# Patient Record
Sex: Female | Born: 1950 | Race: White | Hispanic: No | Marital: Married | Smoking: Former smoker
Health system: Southern US, Community
[De-identification: ages and names within clinical notes are randomized; demographics above are authoritative.]

## PROBLEM LIST (undated history)

## (undated) DIAGNOSIS — K2970 Gastritis, unspecified, without bleeding: Secondary | ICD-10-CM

## (undated) DIAGNOSIS — E320 Persistent hyperplasia of thymus: Secondary | ICD-10-CM

## (undated) DIAGNOSIS — D509 Iron deficiency anemia, unspecified: Secondary | ICD-10-CM

## (undated) DIAGNOSIS — Q433 Congenital malformations of intestinal fixation: Secondary | ICD-10-CM

## (undated) DIAGNOSIS — D75839 Thrombocytosis, unspecified: Secondary | ICD-10-CM

## (undated) DIAGNOSIS — I341 Nonrheumatic mitral (valve) prolapse: Secondary | ICD-10-CM

## (undated) HISTORY — PX: THYMECTOMY: SHX1063

## (undated) HISTORY — DX: Iron deficiency anemia, unspecified: D50.9

## (undated) HISTORY — DX: Persistent hyperplasia of thymus: E32.0

## (undated) SURGERY — IMAGING PROCEDURE, GI TRACT, INTRALUMINAL, VIA CAPSULE

---

## 1992-08-28 HISTORY — PX: SMALL INTESTINE SURGERY: SHX150

## 2019-11-12 DIAGNOSIS — D75839 Thrombocytosis, unspecified: Secondary | ICD-10-CM | POA: Insufficient documentation

## 2021-03-16 LAB — HM COLONOSCOPY

## 2021-03-30 ENCOUNTER — Telehealth: Payer: Self-pay | Admitting: Hematology and Oncology

## 2021-03-30 NOTE — Telephone Encounter (Signed)
Kristina Osborne is a new pt who has moved from [place] needing to establish care for thrombocythemia and anemia. She has been scheduled to see Dr. Lorenso Courier on 7/12 at 1pm. Pt aware to arrive 20 minutes early.

## 2021-04-08 ENCOUNTER — Inpatient Hospital Stay
Payer: No Typology Code available for payment source | Attending: Hematology and Oncology | Admitting: Hematology and Oncology

## 2021-04-08 ENCOUNTER — Inpatient Hospital Stay: Payer: No Typology Code available for payment source

## 2021-04-08 ENCOUNTER — Other Ambulatory Visit: Payer: Self-pay

## 2021-04-08 VITALS — BP 106/68 | HR 57 | Temp 97.9°F | Resp 17 | Ht 66.0 in

## 2021-04-08 DIAGNOSIS — D473 Essential (hemorrhagic) thrombocythemia: Secondary | ICD-10-CM | POA: Diagnosis not present

## 2021-04-08 DIAGNOSIS — D509 Iron deficiency anemia, unspecified: Secondary | ICD-10-CM | POA: Diagnosis present

## 2021-04-08 DIAGNOSIS — Z79899 Other long term (current) drug therapy: Secondary | ICD-10-CM | POA: Insufficient documentation

## 2021-04-08 DIAGNOSIS — Z87891 Personal history of nicotine dependence: Secondary | ICD-10-CM | POA: Diagnosis not present

## 2021-04-08 LAB — CMP (CANCER CENTER ONLY)
ALT: 20 U/L (ref 0–44)
AST: 27 U/L (ref 15–41)
Albumin: 4.6 g/dL (ref 3.5–5.0)
Alkaline Phosphatase: 67 U/L (ref 38–126)
Anion gap: 10 (ref 5–15)
BUN: 12 mg/dL (ref 8–23)
CO2: 26 mmol/L (ref 22–32)
Calcium: 10 mg/dL (ref 8.9–10.3)
Chloride: 104 mmol/L (ref 98–111)
Creatinine: 0.81 mg/dL (ref 0.44–1.00)
GFR, Estimated: >60 mL/min (ref >60)
Glucose, Bld: 92 mg/dL (ref 70–99)
Potassium: 4.5 mmol/L (ref 3.5–5.1)
Sodium: 140 mmol/L (ref 135–145)
Total Bilirubin: 0.8 mg/dL (ref 0.3–1.2)
Total Protein: 7.2 g/dL (ref 6.5–8.1)

## 2021-04-08 LAB — CBC WITH DIFFERENTIAL (CANCER CENTER ONLY)
Abs Immature Granulocytes: 0.02 10*3/uL (ref 0.00–0.07)
Basophils Absolute: 0 10*3/uL (ref 0.0–0.1)
Basophils Relative: 0 %
Eosinophils Absolute: 0 10*3/uL (ref 0.0–0.5)
Eosinophils Relative: 0 %
HCT: 37.9 % (ref 36.0–46.0)
Hemoglobin: 13.4 g/dL (ref 12.0–15.0)
Immature Granulocytes: 0 %
Lymphocytes Relative: 10 %
Lymphs Abs: 0.6 10*3/uL — ABNORMAL LOW (ref 0.7–4.0)
MCH: 43.8 pg — ABNORMAL HIGH (ref 26.0–34.0)
MCHC: 35.4 g/dL (ref 30.0–36.0)
MCV: 123.9 fL — ABNORMAL HIGH (ref 80.0–100.0)
Monocytes Absolute: 0.5 10*3/uL (ref 0.1–1.0)
Monocytes Relative: 7 %
Neutro Abs: 5.6 10*3/uL (ref 1.7–7.7)
Neutrophils Relative %: 83 %
Platelet Count: 182 10*3/uL (ref 150–400)
RBC: 3.06 MIL/uL — ABNORMAL LOW (ref 3.87–5.11)
RDW: 12.6 % (ref 11.5–15.5)
WBC Count: 6.7 10*3/uL (ref 4.0–10.5)
nRBC: 0 % (ref 0.0–0.2)

## 2021-04-08 LAB — IRON AND TIBC
Iron: 74 ug/dL (ref 41–142)
Saturation Ratios: 27 % (ref 21–57)
TIBC: 274 ug/dL (ref 236–444)
UIBC: 200 ug/dL (ref 120–384)

## 2021-04-08 LAB — FERRITIN: Ferritin: 350 ng/mL — ABNORMAL HIGH (ref 11–307)

## 2021-04-08 NOTE — Progress Notes (Signed)
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 Lorenso Courier   CHIEF COMPLAINTS/PURPOSE OF CONSULTATION:  "Essential thrombocythemia"  HISTORY OF PRESENTING ILLNESS:  Kristina Osborne 70 y.o. female with medical history significant for iron deficiency anemia and reported essential thrombocythemia who presents to establish care.   On review of the previous records Kristina Osborne was previously cared for at the [facility] in [place], [place]. She has been on hydroxyurea 1500 mg alternating with 1000 mg every other day.  Has been receiving weekly labs up there but recently moved down to [place] and presents to establish care with a new hematological provider.  On exam today Kristina Osborne reports that she was initially diagnosed with essential thrombocythemia when her platelets reached 1.2 million.  She notes that she was put on aspirin at the time and had worsening bleeding of her AVMs.  She was reportedly JAK2 positive but has not undergone a bone marrow biopsy per her report.  She notes that she has been taking the hydroxyurea 1500 mg alternating with 1000 mg every other day.  She has that she is tolerating it well without any mouth sores, stomach upset, or other symptoms.  On further discussion she notes that her family history is remarkable for heart disease in her mother.  She notes that her father is deceased but is unsure how he passed away.  She also does not currently have any children.  She has a sister who is healthy.  She notes that she was a smoker but quit smoking about 10 to 15 years ago.  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  She does not currently drink any alcohol.  At this time she denies any fevers, chills, sweats, nausea, vomiting or diarrhea.  A full 10 point ROS is listed below.  MEDICAL HISTORY:  History reviewed. No pertinent past medical history.  SURGICAL HISTORY: History reviewed. No pertinent surgical history.  SOCIAL HISTORY: Social History   Socioeconomic History   Marital status: Married    Spouse name: Not on file   Number of children: 0   Years of education: Not on file   Highest education level: Not on file  Occupational History   Not on file  Tobacco Use   Smoking status: Former    Years: 15.00    Types: Cigarettes   Smokeless tobacco: Never  Vaping Use   Vaping Use: Never used  Substance and Sexual Activity   Alcohol use: Not Currently   Drug use: Not on file   Sexual activity: Not on file  Other Topics Concern   Not on file  Social History Narrative   Not on file   Social Determinants of Health   Financial Resource Strain: Not on file  Food Insecurity: Not on file  Transportation Needs: Not on file  Physical Activity: Not on file  Stress: Not on file  Social Connections: Not on file  Intimate Partner Violence: Not on file    FAMILY HISTORY: Family History  Problem Relation Age of Onset   Heart disease Mother    Valvular heart disease Mother    Healthy Sister  ALLERGIES:  is allergic to sulfa antibiotics.  MEDICATIONS:  Current Outpatient Medications  Medication Sig Dispense Refill   Hydroxyurea 100 MG TABS 100 mg. 200 mg alternates with 300 mg     famotidine (PEPCID) 40 MG tablet      folic acid (FOLVITE) 1 MG tablet folic acid 1 mg tablet     propranolol (INDERAL) 10 MG tablet propranolol 10 mg tablet     sucralfate (CARAFATE) 1 g tablet sucralfate 1 gram tablet     No current facility-administered medications for this visit.    REVIEW OF SYSTEMS:   Constitutional: ( - ) fevers, ( - )  chills , ( - ) night  sweats Eyes: ( - ) blurriness of vision, ( - ) double vision, ( - ) watery eyes Ears, nose, mouth, throat, and face: ( - ) mucositis, ( - ) sore throat Respiratory: ( - ) cough, ( - ) dyspnea, ( - ) wheezes Cardiovascular: ( - ) palpitation, ( - ) chest discomfort, ( - ) lower extremity swelling Gastrointestinal:  ( - ) nausea, ( - ) heartburn, ( - ) change in bowel habits Skin: ( - ) abnormal skin rashes Lymphatics: ( - ) new lymphadenopathy, ( - ) easy bruising Neurological: ( - ) numbness, ( - ) tingling, ( - ) new weaknesses Behavioral/Psych: ( - ) mood change, ( - ) new changes  All other systems were reviewed with the patient and are negative.  PHYSICAL EXAMINATION: ECOG PERFORMANCE STATUS: 0 - Asymptomatic  Vitals:   04/08/21 1308  BP: 106/68  Pulse: (!) 57  Resp: 17  Temp: 97.9 F (36.6 C)  SpO2: 100%   Filed Weights    GENERAL: well appearing elderly Caucasian female in NAD  SKIN: skin color, texture, turgor are normal, no rashes or significant lesions EYES: conjunctiva are pink and non-injected, sclera clear  LUNGS: clear to auscultation and percussion with normal breathing effort HEART: regular rate & rhythm and no murmurs and no lower extremity edema PSYCH: alert & oriented x 3, fluent speech NEURO: no focal motor/sensory deficits  LABORATORY DATA:  I have reviewed the data as listed CBC Latest Ref Rng & Units 04/08/2021  WBC 4.0 - 10.5 K/uL 6.7  Hemoglobin 12.0 - 15.0 g/dL 13.4  Hematocrit 36.0 - 46.0 % 37.9  Platelets 150 - 400 K/uL 182    CMP Latest Ref Rng & Units 04/08/2021  Glucose 70 - 99 mg/dL 92  BUN 8 - 23 mg/dL 12  Creatinine 0.44 - 1.00 mg/dL 0.81  Sodium 135 - 145 mmol/L 140  Potassium 3.5 - 5.1 mmol/L 4.5  Chloride 98 - 111 mmol/L 104  CO2 22 - 32 mmol/L 26  Calcium 8.9 - 10.3 mg/dL 10.0  Total Protein 6.5 - 8.1 g/dL 7.2  Total Bilirubin 0.3 - 1.2 mg/dL 0.8  Alkaline Phos 38 - 126 U/L 67  AST 15 - 41 U/L 27  ALT 0 - 44 U/L 20      RADIOGRAPHIC STUDIES: No results found.  ASSESSMENT & PLAN Kristina Osborne 71 y.o. female with medical history significant for iron deficiency anemia and reported essential thrombocythemia who presents to establish care.   After review of the labs, review of the records, and discussion with the patient the patients findings are most consistent with thrombocytosis, reported due to essential thrombocythemia.  In order to confirm the diagnosis we will need records showing that she has a JAK2 mutation.  Additionally she will require a bone marrow  biopsy in order to assure that essential thrombocythemia is the diagnosis.  In the interim we will have the patient continue her hydroxyurea as previously prescribed by her provider in [place].  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  She will require a bone marrow biopsy to confirm this diagnosis --Patient requested that we do not order JAK2 mutational panel today and she will obtain the original results from her previous oncologist --We will schedule the patient for a bone marrow biopsy. --Continue with currently scheduled 1500 mg hydroxyurea alternating with 1000 mg every other day --Return to clinic in 3 months time in order to continue monitoring her CBC.  She has been markedly stable and therefore a 34-monthfollow-up would be appropriate at this time.  Orders Placed This Encounter  Procedures   CBC with Differential ([facility])    Standing Status:   Future    Number of Occurrences:   1    Standing Expiration Date:   04/08/2022   CMP [place])    Standing Status:   Future    Number of Occurrences:   1    Standing Expiration Date:   04/08/2022   Ferritin    Standing Status:   Future    Number of Occurrences:   1    Standing Expiration Date:   04/08/2022   Iron and TIBC     Standing Status:   Future    Number of Occurrences:   1    Standing Expiration Date:   04/08/2022    All questions were answered. The patient knows to call the clinic with any problems, questions or concerns.  A total of more than 60 minutes were spent on this encounter with face-to-face time and non-face-to-face time, including preparing to see the patient, ordering tests and/or medications, counseling the patient and coordination of care as outlined above.   JLedell Peoples MD Department of Hematology/Oncology [facility] HospitalPhone: [phone]: [phone]: [email or web address]  04/12/2021 10:00 AM

## 2021-04-12 ENCOUNTER — Encounter: Payer: Self-pay | Admitting: Hematology and Oncology

## 2021-04-21 ENCOUNTER — Telehealth: Payer: Self-pay | Admitting: *Deleted

## 2021-04-21 ENCOUNTER — Other Ambulatory Visit: Payer: Self-pay | Admitting: Hematology and Oncology

## 2021-04-21 ENCOUNTER — Other Ambulatory Visit: Payer: Self-pay | Admitting: *Deleted

## 2021-04-21 DIAGNOSIS — Z0000 Encounter for general adult medical examination without abnormal findings: Secondary | ICD-10-CM

## 2021-04-21 DIAGNOSIS — D473 Essential (hemorrhagic) thrombocythemia: Secondary | ICD-10-CM

## 2021-04-21 MED ORDER — FOLIC ACID 1 MG PO TABS: 1.0000 mg | ORAL_TABLET | Freq: Every day | ORAL | 1 refills | Status: DC

## 2021-04-21 MED ORDER — FAMOTIDINE 40 MG PO TABS: 40.0000 mg | ORAL_TABLET | Freq: Every day | ORAL | 1 refills | Status: DC

## 2021-04-21 MED ORDER — SUCRALFATE 1 G PO TABS: 1.0000 g | ORAL_TABLET | Freq: Three times a day (TID) | ORAL | 1 refills | Status: DC

## 2021-04-21 MED ORDER — HYDROXYUREA 500 MG PO CAPS: 500.0000 mg | ORAL_CAPSULE | Freq: Every day | ORAL | 1 refills | Status: DC

## 2021-04-21 MED ORDER — PROPRANOLOL HCL 10 MG PO TABS: ORAL_TABLET | ORAL | 1 refills | Status: DC

## 2021-04-21 NOTE — Telephone Encounter (Signed)
-----  Message from Orson Slick, MD sent at 04/19/2021  8:30 AM EDT ----- Please let Kristina Osborne know that her labs look excellent. Plt are 181. Please remind her to have a copy of her JAK2 testing faxed to our office for review. A request has been placed for a bone marrow biopsy.   ----- Message ----- From: Buel Ream, [facility] [place] Sent: 04/08/2021   2:27 PM EDT To: Orson Slick, MD

## 2021-04-21 NOTE — Telephone Encounter (Signed)
TCT patient regarding her recent lab reports. Spoke with her and advised that her platelets are excellent @ 181. She is to continue her current Hydroxyurea regime  of 1500 mg alternating with 1000mg . She is aware of her bone marrow biopsy appt. Reminded her to have her JAK 2 report from her previous hematologist fax'd to us as soon as possible. Pt states she will do that. She also states that she needs refills of her medications as she has not found a PCP yet. Dr. Dorsey is agreeable to this for next 2 months only (except for Hydroxyurea) She uses [facility] on [address]. 

## 2021-05-11 ENCOUNTER — Encounter (HOSPITAL_COMMUNITY): Payer: Self-pay | Admitting: Emergency Medicine

## 2021-05-11 ENCOUNTER — Emergency Department (HOSPITAL_COMMUNITY)
Admission: EM | Admit: 2021-05-11 | Discharge: 2021-05-12 | Disposition: A | Discharge status: Home / Self Care | Payer: No Typology Code available for payment source | Attending: Emergency Medicine | Admitting: Emergency Medicine

## 2021-05-11 ENCOUNTER — Other Ambulatory Visit: Payer: Self-pay

## 2021-05-11 ENCOUNTER — Emergency Department (HOSPITAL_COMMUNITY): Payer: No Typology Code available for payment source

## 2021-05-11 DIAGNOSIS — W108XXA Fall (on) (from) other stairs and steps, initial encounter: Secondary | ICD-10-CM | POA: Insufficient documentation

## 2021-05-11 DIAGNOSIS — Z87891 Personal history of nicotine dependence: Secondary | ICD-10-CM | POA: Insufficient documentation

## 2021-05-11 DIAGNOSIS — S66911A Strain of unspecified muscle, fascia and tendon at wrist and hand level, right hand, initial encounter: Secondary | ICD-10-CM

## 2021-05-11 DIAGNOSIS — S6991XA Unspecified injury of right wrist, hand and finger(s), initial encounter: Secondary | ICD-10-CM | POA: Diagnosis present

## 2021-05-11 DIAGNOSIS — S0990XA Unspecified injury of head, initial encounter: Secondary | ICD-10-CM | POA: Insufficient documentation

## 2021-05-11 DIAGNOSIS — W19XXXA Unspecified fall, initial encounter: Secondary | ICD-10-CM

## 2021-05-11 DIAGNOSIS — Y9301 Activity, walking, marching and hiking: Secondary | ICD-10-CM | POA: Insufficient documentation

## 2021-05-11 DIAGNOSIS — S66912A Strain of unspecified muscle, fascia and tendon at wrist and hand level, left hand, initial encounter: Secondary | ICD-10-CM | POA: Diagnosis not present

## 2021-05-11 NOTE — ED Triage Notes (Signed)
Patient here for evaluation after tripping while walking down stairs by [employer] in the hospital and falling to the floor and landing on right side. Denies loss of consciousness. Complains of right elbow pain. Patient alert, oriented, and in no apparent distress at this time.

## 2021-05-11 NOTE — ED Provider Notes (Signed)
Emergency Medicine Provider Triage Evaluation Note  Kristina Osborne , a 70 y.o. female  was evaluated in triage.  Pt complains of fall.  Patient reports that she suffered a fall just prior to coming to the emergency department.  Patient reports that she missed a step and fell and a few stairs.  Patient endorses falling to the floor and hitting her head.  Patient denies any loss of consciousness.  Patient endorses pain to right elbow.  Rates pain 1/10 on pain scale.  Patient denies being on any blood thinners.  Patient denies any numbness, weakness, visual disturbance, saddle anesthesia.    Review of Systems  Positive: Fall, head injury, right elbow pain Negative: numbness, weakness, visual disturbance, saddle anesthesia  Physical Exam  BP 137/67 (BP Location: Left Arm)   Pulse 93   Temp 98.7 F (37.1 C)   Resp 20   SpO2 100%  Gen:   Awake, no distress   Resp:  Normal effort  MSK:   Moves extremities without difficulty, patient has full range of motion to right elbow.  No swelling or deformity noted to right forearm, right elbow, or right upper arm.  Other:  No midline tenderness or deformity to cervical, thoracic, or lumbar spine.  No vital sign.  Medical Decision Making  Medically screening exam initiated at 3:13 PM.  Appropriate orders placed.  Derrick Eppers was informed that the remainder of the evaluation will be completed by another provider, this initial triage assessment does not replace that evaluation, and the importance of remaining in the ED until their evaluation is complete.  The patient appears stable so that the remainder of the work up may be completed by another provider.      Loni Beckwith, PA-C 05/11/21 1513    Elnora Morrison, MD 05/12/21 [phone]

## 2021-05-12 ENCOUNTER — Emergency Department (HOSPITAL_COMMUNITY): Payer: No Typology Code available for payment source

## 2021-05-12 ENCOUNTER — Telehealth: Payer: Self-pay | Admitting: *Deleted

## 2021-05-12 MED ORDER — HYDROCODONE-ACETAMINOPHEN 5-325 MG PO TABS
1.0000 | ORAL_TABLET | Freq: Once | ORAL | Status: AC
  Administered 2021-05-12: 1 via ORAL
  Filled 2021-05-12: qty 1

## 2021-05-12 MED ORDER — HYDROCODONE-ACETAMINOPHEN 5-325 MG PO TABS: 0.5000 | ORAL_TABLET | ORAL | 0 refills | Status: DC | PRN

## 2021-05-12 NOTE — Discharge Instructions (Signed)
Take Norco - 1/2 to 1 tablet every 4-6 hours for pain. Cold compresses to the wrist to reduce swelling.   Return to the ED with any new or concerning symptoms.

## 2021-05-12 NOTE — ED Notes (Signed)
Patient verbalizes understanding of discharge instructions. Prescriptions reviewed. Pt educated on how to apply and remove wrist brace. Opportunity for questioning and answers were provided. Armband removed by staff, pt discharged from ED ambulatory.

## 2021-05-12 NOTE — ED Provider Notes (Signed)
[facility] EMERGENCY DEPARTMENT Provider Note   CSN: [id number] Arrival date & time: 05/11/21  1452     History Chief Complaint  Patient presents with   Kristina Osborne    Kristina Osborne is a 70 y.o. female.  Patient to ED for evaluation of injuries sustained after mechanical fall while walking down steps. She feels she missed the last step or two, causing her to fall to the side, hitting her head against a wall before falling to the ground. She did not lose consciousness. No subsequent nausea or vomiting. She reports right wrist pain but is unsure how it was injured. No other injury. No chest/abdominal/neck pain. She has been ambulatory since the accident. Not anticoagulated.   The history is provided by the patient. No language interpreter was used.  Fall Associated symptoms include headaches. Pertinent negatives include no chest pain, no abdominal pain and no shortness of breath.      History reviewed. No pertinent past medical history.  There are no problems to display for this patient.   No past surgical history on file.   OB History   No obstetric history on file.     Family History  Problem Relation Age of Onset   Heart disease Mother    Valvular heart disease Mother    Healthy Sister     Social History   Tobacco Use   Smoking status: Former    Years: 15.00    Types: Cigarettes   Smokeless tobacco: Never  Vaping Use   Vaping Use: Never used  Substance Use Topics   Alcohol use: Not Currently    Home Medications Prior to Admission medications   Medication Sig Start Date End Date Taking? Authorizing Provider  famotidine (PEPCID) 40 MG tablet Take 1 tablet (40 mg total) by mouth daily. 04/21/21   Orson Slick, MD  folic acid (FOLVITE) 1 MG tablet Take 1 tablet (1 mg total) by mouth daily. 04/21/21   Orson Slick, MD  hydroxyurea (HYDREA) 500 MG capsule Take 1 capsule (500 mg total) by mouth daily. Take 2 tablets alternating with  3  tablets every other day. May take with food to minimize GI side effects. 04/21/21   Orson Slick, MD  propranolol (INDERAL) 10 MG tablet propranolol 10 mg tablet 04/21/21   Orson Slick, MD  sucralfate (CARAFATE) 1 g tablet Take 1 tablet (1 g total) by mouth 4 (four) times daily -  with meals and at bedtime. 04/21/21   Orson Slick, MD    Allergies    Sulfa antibiotics  Review of Systems   Review of Systems  Constitutional:  Negative for diaphoresis.  HENT: Negative.  Negative for facial swelling and nosebleeds.   Eyes:  Negative for visual disturbance.  Respiratory: Negative.  Negative for shortness of breath.   Cardiovascular: Negative.  Negative for chest pain.  Gastrointestinal: Negative.  Negative for abdominal pain, nausea and vomiting.  Musculoskeletal:  Negative for back pain and neck pain.       See HPI.  Skin: Negative.  Negative for color change and wound.  Neurological:  Positive for headaches. Negative for syncope and weakness.   Physical Exam Updated Vital Signs BP 122/64 (BP Location: Right Arm)   Pulse 68   Temp 98.7 F (37.1 C)   Resp 16   SpO2 100%   Physical Exam Vitals and nursing note reviewed.  Constitutional:      General: She is not  in acute distress.    Appearance: She is well-developed. She is not ill-appearing.  HENT:     Head: Normocephalic and atraumatic.     Comments: No wounds, redness or hematoma    Nose: Nose normal.  Eyes:     Pupils: Pupils are equal, round, and reactive to light.  Cardiovascular:     Rate and Rhythm: Normal rate.  Pulmonary:     Effort: Pulmonary effort is normal.  Chest:     Chest wall: No tenderness.  Abdominal:     Tenderness: There is no abdominal tenderness.  Musculoskeletal:        General: Normal range of motion.     Cervical back: Normal range of motion.     Comments: No midline cervical tenderness. Right wrist mildly swollen without deformity or discoloration. There is pain with movement.  Has full range of all digits of right hand, and full range of elbow. Cap RF >2s.  Skin:    General: Skin is warm and dry.     Findings: No bruising or erythema.  Neurological:     Mental Status: She is alert and oriented to person, place, and time.     Sensory: No sensory deficit.     Coordination: Coordination normal.     Gait: Gait normal.    ED Results / Procedures / Treatments   Labs (all labs ordered are listed, but only abnormal results are displayed) Labs Reviewed - No data to display  EKG None  Radiology DG Wrist Complete Right  Result Date: 05/12/2021 CLINICAL DATA:  Fall, wrist pain EXAM: RIGHT WRIST - COMPLETE 3+ VIEW COMPARISON:  None. FINDINGS: No fracture or dislocation is seen. The joint spaces are preserved. The visualized soft tissues are unremarkable. IMPRESSION: Negative. Electronically Signed   By: Julian Hy M.D.   On: 05/12/2021 00:15   CT HEAD WO CONTRAST (5MM)  Result Date: 05/11/2021 CLINICAL DATA:  Fall. EXAM: CT HEAD WITHOUT CONTRAST TECHNIQUE: Contiguous axial images were obtained from the base of the skull through the vertex without intravenous contrast. COMPARISON:  None. FINDINGS: Brain: No evidence of acute infarction, hemorrhage, hydrocephalus, extra-axial collection or mass lesion/mass effect. Cerebral volume within normal limits for age. Vascular: No hyperdense vessel or unexpected calcification. Skull: Normal. Negative for fracture or focal lesion. Sinuses/Orbits: No acute finding. Other: None. IMPRESSION: 1. Normal for age noncontrast head CT. Electronically Signed   By: Titus Dubin M.D.   On: 05/11/2021 16:04    Procedures Procedures   Medications Ordered in ED Medications  HYDROcodone-acetaminophen (NORCO/VICODIN) 5-325 MG per tablet 1 tablet (has no administration in time range)    ED Course  I have reviewed the triage vital signs and the nursing notes.  Pertinent labs & imaging results that were available during my care of the  patient were reviewed by me and considered in my medical decision making (see chart for details).    MDM Rules/Calculators/A&P                           Patient to ED with injuries after fall. She reports headache pain after hitting her head (no LOC) and right wrist pain.   She is very well appearing, comfortable. In NAD. VSS. Head CT and right wrist imaging negative. Her neck is becoming sore over time, c/w musculoskeletal pain. No midline tenderness to suggest cervical injury.   She can be discharged home. Wrist splint applied. Will refer to hand ortho for  recheck if pain persists.   Final Clinical Impression(s) / ED Diagnoses Final diagnoses:  None   Fall Right wrist strain Minor head injury  Rx / DC Orders ED Discharge Orders     None        Dennie Bible 05/12/21 0442    Arnaldo Natal, MD 05/12/21 [phone]

## 2021-05-12 NOTE — Telephone Encounter (Signed)
Received call from pt. She states she sustained a fall @ [facility] while visiting her husband. She was seen in the ED and released to go home.  She states her work suggested she apply for short term disability for a week. She is asking if Dr. Lorenso Courier would help with this. Advised that it takes more than a week to go through the process of short term disability. Suggested she get a note from the ED for a work excuse rather than go through the hoops of STD for only a week. Also advised that since Dr. Lorenso Courier is not the treating physician for her fall, he could not be the MD to sign off on this. Whole speaking with her,  asked her if she spoke to her previous doctor has fax'd the [facility] report as we have not received that report yet. She states she will contact her previous MD for that again.

## 2021-05-15 ENCOUNTER — Other Ambulatory Visit: Payer: Self-pay | Admitting: Hematology and Oncology

## 2021-05-17 ENCOUNTER — Other Ambulatory Visit (HOSPITAL_COMMUNITY): Payer: Self-pay | Admitting: Physician Assistant

## 2021-05-18 ENCOUNTER — Other Ambulatory Visit: Payer: Self-pay | Admitting: Radiology

## 2021-05-19 ENCOUNTER — Ambulatory Visit (HOSPITAL_COMMUNITY): Payer: No Typology Code available for payment source

## 2021-05-19 ENCOUNTER — Ambulatory Visit (HOSPITAL_COMMUNITY): Admission: RE | Admit: 2021-05-19 | Payer: No Typology Code available for payment source | Source: Ambulatory Visit

## 2021-06-08 ENCOUNTER — Other Ambulatory Visit: Payer: Self-pay | Admitting: Hematology and Oncology

## 2021-06-14 ENCOUNTER — Other Ambulatory Visit: Payer: Self-pay | Admitting: *Deleted

## 2021-06-14 MED ORDER — SUCRALFATE 1 G PO TABS: 1.0000 g | ORAL_TABLET | Freq: Three times a day (TID) | ORAL | 0 refills | Status: DC

## 2021-06-14 MED ORDER — FAMOTIDINE 40 MG PO TABS: 40.0000 mg | ORAL_TABLET | Freq: Every day | ORAL | 0 refills | Status: DC

## 2021-06-14 NOTE — Telephone Encounter (Signed)
Received faxed refill request for 90 day supply of Carafate and Pepcid - 90 day supply due to insurance requirements. Per Dr. Lorenso Courier, will refill this one time only.  Further refills to be filled by patient's PCP - has appt with Dr.Jones on 07/08/21 to establish care.

## 2021-06-27 ENCOUNTER — Other Ambulatory Visit: Payer: Self-pay | Admitting: Hematology and Oncology

## 2021-07-03 ENCOUNTER — Other Ambulatory Visit: Payer: Self-pay | Admitting: Hematology and Oncology

## 2021-07-11 ENCOUNTER — Inpatient Hospital Stay: Payer: No Typology Code available for payment source | Admitting: Hematology and Oncology

## 2021-07-11 ENCOUNTER — Inpatient Hospital Stay: Payer: No Typology Code available for payment source

## 2021-07-28 ENCOUNTER — Inpatient Hospital Stay (HOSPITAL_BASED_OUTPATIENT_CLINIC_OR_DEPARTMENT_OTHER): Payer: No Typology Code available for payment source | Admitting: Hematology and Oncology

## 2021-07-28 ENCOUNTER — Encounter: Payer: Self-pay | Admitting: Hematology and Oncology

## 2021-07-28 ENCOUNTER — Other Ambulatory Visit: Payer: Self-pay

## 2021-07-28 ENCOUNTER — Other Ambulatory Visit: Payer: Self-pay | Admitting: Hematology and Oncology

## 2021-07-28 ENCOUNTER — Inpatient Hospital Stay: Payer: No Typology Code available for payment source | Attending: Hematology and Oncology

## 2021-07-28 VITALS — BP 106/61 | HR 78 | Temp 97.7°F | Resp 16 | Ht 66.0 in

## 2021-07-28 DIAGNOSIS — D509 Iron deficiency anemia, unspecified: Secondary | ICD-10-CM | POA: Diagnosis present

## 2021-07-28 DIAGNOSIS — D473 Essential (hemorrhagic) thrombocythemia: Secondary | ICD-10-CM

## 2021-07-28 DIAGNOSIS — D75839 Thrombocytosis, unspecified: Secondary | ICD-10-CM | POA: Diagnosis not present

## 2021-07-28 LAB — CBC WITH DIFFERENTIAL (CANCER CENTER ONLY)
Abs Immature Granulocytes: 0.01 10*3/uL (ref 0.00–0.07)
Basophils Absolute: 0 10*3/uL (ref 0.0–0.1)
Basophils Relative: 1 %
Eosinophils Absolute: 0 10*3/uL (ref 0.0–0.5)
Eosinophils Relative: 0 %
HCT: 39.1 % (ref 36.0–46.0)
Hemoglobin: 13.3 g/dL (ref 12.0–15.0)
Immature Granulocytes: 0 %
Lymphocytes Relative: 13 %
Lymphs Abs: 0.7 10*3/uL (ref 0.7–4.0)
MCH: 42 pg — ABNORMAL HIGH (ref 26.0–34.0)
MCHC: 34 g/dL (ref 30.0–36.0)
MCV: 123.3 fL — ABNORMAL HIGH (ref 80.0–100.0)
Monocytes Absolute: 0.5 10*3/uL (ref 0.1–1.0)
Monocytes Relative: 9 %
Neutro Abs: 4 10*3/uL (ref 1.7–7.7)
Neutrophils Relative %: 77 %
Platelet Count: 214 10*3/uL (ref 150–400)
RBC: 3.17 MIL/uL — ABNORMAL LOW (ref 3.87–5.11)
RDW: 11.3 % — ABNORMAL LOW (ref 11.5–15.5)
WBC Count: 5.2 10*3/uL (ref 4.0–10.5)
nRBC: 0 % (ref 0.0–0.2)

## 2021-07-28 LAB — CMP (CANCER CENTER ONLY)
ALT: 22 U/L (ref 0–44)
AST: 25 U/L (ref 15–41)
Albumin: 4.3 g/dL (ref 3.5–5.0)
Alkaline Phosphatase: 67 U/L (ref 38–126)
Anion gap: 10 (ref 5–15)
BUN: 11 mg/dL (ref 8–23)
CO2: 25 mmol/L (ref 22–32)
Calcium: 9.5 mg/dL (ref 8.9–10.3)
Chloride: 105 mmol/L (ref 98–111)
Creatinine: 0.82 mg/dL (ref 0.44–1.00)
GFR, Estimated: >60 mL/min (ref >60)
Glucose, Bld: 92 mg/dL (ref 70–99)
Potassium: 4.1 mmol/L (ref 3.5–5.1)
Sodium: 140 mmol/L (ref 135–145)
Total Bilirubin: 0.6 mg/dL (ref 0.3–1.2)
Total Protein: 7 g/dL (ref 6.5–8.1)

## 2021-07-28 NOTE — Progress Notes (Signed)
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 Lorenso Courier   Interval History:  Kristina Osborne 70 y.o. female with medical history significant for essential thrombocytosis who presents for a follow up visit. The patient's last visit was on 04/08/2021 at which time she established care. In the interim since the last visit Kristina Osborne has declined to have a bone marrow biopsy performed and has not produced the JAK2 testing results.  On exam today Kristina Osborne is accompanied by her husband.  She reports that she has been well overall in the interim since her last visit.  She denies any nausea, ming, or diarrhea.  She denies any stomach upset.  She is tolerating her hydroxyurea therapy well without any difficulty.  She denies any ulcers on her mouth or on her ankles.  She reports no difficulty with bleeding or bruising.  She also denies any signs or symptoms concerning for VTE including shortness of breath, leg swelling, or chest pain.  A full 10 point ROS is listed below.  The bulk of our discussion focused on the reason for why we would like a bone marrow biopsy.  The patient noted that she would like to hold at this time.  MEDICAL HISTORY:  No past medical history on file.  SURGICAL HISTORY: No past surgical history on file.  SOCIAL HISTORY: Social History   Socioeconomic History   Marital status: Married    Spouse name: Not on file   Number of children: 0   Years of education: Not on file   Highest education level: Not on file  Occupational History   Not on file  Tobacco Use   Smoking status: Former    Years: 15.00    Types: Cigarettes   Smokeless  tobacco: Never  Vaping Use   Vaping Use: Never used  Substance and Sexual Activity   Alcohol use: Not Currently   Drug use: Not on file   Sexual activity: Not on file  Other Topics Concern   Not on file  Social History Narrative   Not on file   Social Determinants of Health   Financial Resource Strain: Not on file  Food Insecurity: Not on file  Transportation Needs: Not on file  Physical Activity: Not on file  Stress: Not on file  Social Connections: Not on file  Intimate Partner Violence: Not on file    FAMILY HISTORY: Family History  Problem Relation Age of Onset   Heart disease Mother    Valvular heart disease Mother    Healthy Sister     ALLERGIES:  is allergic to sulfa antibiotics.  MEDICATIONS:  Current Outpatient Medications  Medication Sig Dispense Refill   famotidine (PEPCID) 40 MG tablet Take 1 tablet (40 mg total) by mouth daily. 90 tablet 0   folic acid (FOLVITE) 1 MG tablet TAKE 1 TABLET BY MOUTH EVERY DAY 30 tablet 1   hydroxyurea (HYDREA) 500 MG capsule TAKE 2 CAPS ALTERNATING WITH 3 CAPS EVERY OTHER DAY W/FOOD TO MINIMIZE GI EFFECTS 60 capsule 1   propranolol (INDERAL) 10 MG tablet propranolol 10 mg tablet 30 tablet 1   sucralfate (CARAFATE) 1 g tablet Take 1 tablet (1 g total) by mouth  4 (four) times daily -  with meals and at bedtime. 360 tablet 0   No current facility-administered medications for this visit.    REVIEW OF SYSTEMS:   Constitutional: ( - ) fevers, ( - )  chills , ( - ) night sweats Eyes: ( - ) blurriness of vision, ( - ) double vision, ( - ) watery eyes Ears, nose, mouth, throat, and face: ( - ) mucositis, ( - ) sore throat Respiratory: ( - ) cough, ( - ) dyspnea, ( - ) wheezes Cardiovascular: ( - ) palpitation, ( - ) chest discomfort, ( - ) lower extremity swelling Gastrointestinal:  ( - ) nausea, ( - ) heartburn, ( - ) change in bowel habits Skin: ( - ) abnormal skin rashes Lymphatics: ( - ) new lymphadenopathy, ( - ) easy  bruising Neurological: ( - ) numbness, ( - ) tingling, ( - ) new weaknesses Behavioral/Psych: ( - ) mood change, ( - ) new changes  All other systems were reviewed with the patient and are negative.  PHYSICAL EXAMINATION: ECOG PERFORMANCE STATUS: 0 - Asymptomatic  Vitals:   07/28/21 1104  BP: 106/61  Pulse: 78  Resp: 16  Temp: 97.7 F (36.5 C)  SpO2: 100%   Filed Weights    GENERAL: Well-appearing elderly Caucasian female, alert, no distress and comfortable SKIN: skin color, texture, turgor are normal, no rashes or significant lesions EYES: conjunctiva are pink and non-injected, sclera clear LUNGS: clear to auscultation and percussion with normal breathing effort HEART: regular rate & rhythm and no murmurs and no lower extremity edema Musculoskeletal: no cyanosis of digits and no clubbing  PSYCH: alert & oriented x 3, fluent speech NEURO: no focal motor/sensory deficits  LABORATORY DATA:  I have reviewed the data as listed CBC Latest Ref Rng & Units 07/28/2021 04/08/2021  WBC 4.0 - 10.5 K/uL 5.2 6.7  Hemoglobin 12.0 - 15.0 g/dL 13.3 13.4  Hematocrit 36.0 - 46.0 % 39.1 37.9  Platelets 150 - 400 K/uL 214 182    CMP Latest Ref Rng & Units 07/28/2021 04/08/2021  Glucose 70 - 99 mg/dL 92 92  BUN 8 - 23 mg/dL 11 12  Creatinine 0.44 - 1.00 mg/dL 0.82 0.81  Sodium 135 - 145 mmol/L 140 140  Potassium 3.5 - 5.1 mmol/L 4.1 4.5  Chloride 98 - 111 mmol/L 105 104  CO2 22 - 32 mmol/L 25 26  Calcium 8.9 - 10.3 mg/dL 9.5 10.0  Total Protein 6.5 - 8.1 g/dL 7.0 7.2  Total Bilirubin 0.3 - 1.2 mg/dL 0.6 0.8  Alkaline Phos 38 - 126 U/L 67 67  AST 15 - 41 U/L 25 27  ALT 0 - 44 U/L 22 20   RADIOGRAPHIC STUDIES: No results found.  ASSESSMENT & PLAN Kristina Osborne 70 y.o. female with medical history significant for essential thrombocytosis who presents for a follow up visit.   After review of the labs, review of the records, and discussion with the patient the patients findings are  most consistent with thrombocytosis, reported due to essential thrombocythemia.  In order to confirm the diagnosis we will need records showing that she has a JAK2 mutation.  Additionally she will require a bone marrow biopsy in order to assure that essential thrombocythemia is the diagnosis.  In the interim we will have the patient continue her hydroxyurea as previously prescribed by her provider in [place].  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  She will require a bone marrow biopsy to confirm this diagnosis --Patient requested that we do not order JAK2 mutational panel today and she will obtain the original results from her previous oncologist. She noted that would be emailed to [place] today.  --We will schedule the patient for a bone marrow biopsy. --Continue with currently scheduled 1500 mg hydroxyurea alternating with 1000 mg every other day --Return to clinic in 3 months time in order to continue monitoring her CBC.  She has been markedly stable and therefore a 53-monthfollow-up would be appropriate at this time.   No orders of the defined types were placed in this encounter.  All questions were answered. The patient knows to call the clinic with any problems, questions or concerns.  A total of more than 30 minutes were spent on this encounter with face-to-face time and non-face-to-face time, including preparing to see the patient, ordering tests and/or medications, counseling the patient and coordination of care as outlined above.   JLedell Peoples MD Department of Hematology/Oncology [place] LLCPhone: [phone]: [phone]: [email or web address]  07/28/2021 12:34 PM

## 2021-08-04 ENCOUNTER — Encounter: Payer: Self-pay | Admitting: Internal Medicine

## 2021-08-26 ENCOUNTER — Other Ambulatory Visit: Payer: Self-pay | Admitting: Hematology and Oncology

## 2021-08-31 ENCOUNTER — Telehealth: Payer: Self-pay | Admitting: *Deleted

## 2021-08-31 NOTE — Telephone Encounter (Signed)
Received call from pt stating that she thinks she has Covid as her husband was admitted to the hospital with covid. She is asking if we can prescribe Paxlovid for her.  Asked pt if she has done a Covid test. She states she has not.  Advised that pt will need to go to an Urgent Care and be tested and then prescribed appropriate medication.  Pt reluctant to go as she does not feel well. Advised that this is what needs to happen. She needs to be seen by her PCP (she states she does not have one) or go to Urgent Care for appropriate treatment.  Pt voiced understanding.

## 2021-08-31 NOTE — Telephone Encounter (Signed)
Received call back from pt. She states she has tried 4 different Urgent Care centers and was not able to be seen. She called asking what she should do. Advised that I would call [facility] on [address] and find out if she can get in. Call made to to [facility]. They said that had no 'regular appts' but that Ms. Dykes could come in as a walk-in. The wait is 1-2 hours. Call made to pt and advised of the above.. Pt appreciative and states she will go there.

## 2021-09-01 ENCOUNTER — Ambulatory Visit (HOSPITAL_COMMUNITY)
Admission: EM | Admit: 2021-09-01 | Discharge: 2021-09-01 | Disposition: A | Discharge status: Home / Self Care | Payer: No Typology Code available for payment source | Attending: Emergency Medicine | Admitting: Emergency Medicine

## 2021-09-01 ENCOUNTER — Ambulatory Visit: Payer: Self-pay

## 2021-09-01 ENCOUNTER — Other Ambulatory Visit: Payer: Self-pay

## 2021-09-01 ENCOUNTER — Encounter (HOSPITAL_COMMUNITY): Payer: Self-pay | Admitting: Emergency Medicine

## 2021-09-01 DIAGNOSIS — J069 Acute upper respiratory infection, unspecified: Secondary | ICD-10-CM | POA: Diagnosis present

## 2021-09-01 DIAGNOSIS — Z20822 Contact with and (suspected) exposure to covid-19: Secondary | ICD-10-CM | POA: Insufficient documentation

## 2021-09-01 HISTORY — DX: Thrombocytosis, unspecified: D75.839

## 2021-09-01 HISTORY — DX: Gastritis, unspecified, without bleeding: K29.70

## 2021-09-01 HISTORY — DX: Nonrheumatic mitral (valve) prolapse: I34.1

## 2021-09-01 HISTORY — DX: Congenital malformations of intestinal fixation: Q43.3

## 2021-09-01 LAB — SARS CORONAVIRUS 2 (TAT 6-24 HRS): SARS Coronavirus 2: POSITIVE — AB

## 2021-09-01 MED ORDER — MOLNUPIRAVIR EUA 200MG CAPSULE: 4.0000 | ORAL_CAPSULE | Freq: Two times a day (BID) | ORAL | 0 refills | Status: AC

## 2021-09-01 MED ORDER — PROMETHAZINE-DM 6.25-15 MG/5ML PO SYRP: 5.0000 mL | ORAL_SOLUTION | Freq: Four times a day (QID) | ORAL | 0 refills | Status: DC | PRN

## 2021-09-01 MED ORDER — FLUTICASONE PROPIONATE 50 MCG/ACT NA SUSP: 2.0000 | Freq: Every day | NASAL | 0 refills | Status: DC

## 2021-09-01 NOTE — ED Triage Notes (Signed)
Patient c/o nonproductive cough and fever x 3 days.   Patient endorses highest temperature of 104 F.   Patient is interested in antiviral treatment if possible.   Patient endorses being exposed to [place] "husband is currently in the hospital for [place]".   Patient has taken Robitussin and Tylenol with no relief of symptoms.

## 2021-09-01 NOTE — ED Provider Notes (Signed)
HPI  SUBJECTIVE:  Kristina Osborne is a 71 y.o. female who presents with fevers 100.4, cough, headache, nasal congestion, rhinorrhea, sore throat starting 3 days ago.  No body aches, loss of sense of smell or taste, postnasal drip, wheezing, shortness of breath, nausea, vomiting, diarrhea, abdominal pain.  She states her husband is currently admitted to the hospital with [place].  She took an antipyretic within 6 hours of evaluation.  She has been taking 1000 mg of Tylenol and Robitussin-DM.  The Tylenol helps.  No aggravating factors.  She got 4 doses of the COVID-vaccine and also this years flu vaccine.  She has a past medical history of mitral valve prolapse, GI AVM, and thrombocytosis.  She has never had COVID.  PMD: She is establishing care with a PCP in mid January.  Past Medical History:  Diagnosis Date   Congenital malformation of intestinal fixation    Gastritis    Mitral valve prolapse    Thrombocytosis     Past Surgical History:  Procedure Laterality Date   SMALL INTESTINE SURGERY  1994    Family History  Problem Relation Age of Onset   Heart disease Mother    Valvular heart disease Mother    Healthy Sister     Social History   Tobacco Use   Smoking status: Former    Years: 15.00    Types: Cigarettes   Smokeless tobacco: Never  Vaping Use   Vaping Use: Never used  Substance Use Topics   Alcohol use: Not Currently    No current facility-administered medications for this encounter.  Current Outpatient Medications:    famotidine (PEPCID) 40 MG tablet, Take 1 tablet (40 mg total) by mouth daily., Disp: 90 tablet, Rfl: 0   fluticasone (FLONASE) 50 MCG/ACT nasal spray, Place 2 sprays into both nostrils daily., Disp: 16 g, Rfl: 0   folic acid (FOLVITE) 1 MG tablet, TAKE 1 TABLET BY MOUTH EVERY DAY, Disp: 30 tablet, Rfl: 1   hydroxyurea (HYDREA) 500 MG capsule, TAKE 2 CAPS ALTERNATING WITH 3 CAPS EVERY OTHER DAY W/FOOD TO MINIMIZE GI EFFECTS, Disp: 60 capsule, Rfl:  1   molnupiravir EUA (LAGEVRIO) 200 mg CAPS capsule, Take 4 capsules (800 mg total) by mouth 2 (two) times daily for 5 days., Disp: 40 capsule, Rfl: 0   promethazine-dextromethorphan (PROMETHAZINE-DM) 6.25-15 MG/5ML syrup, Take 5 mLs by mouth 4 (four) times daily as needed for cough., Disp: 118 mL, Rfl: 0   sucralfate (CARAFATE) 1 g tablet, Take 1 tablet (1 g total) by mouth 4 (four) times daily -  with meals and at bedtime., Disp: 360 tablet, Rfl: 0   propranolol (INDERAL) 10 MG tablet, propranolol 10 mg tablet, Disp: 30 tablet, Rfl: 1  Allergies  Allergen Reactions   Aspirin Other (See Comments)    "Due to intestinal issue"   Sulfa Antibiotics      ROS  As noted in HPI.   Physical Exam  BP (!) 114/59 (BP Location: Left Arm)    Pulse (!) 106    Temp 98.8 F (37.1 C) (Oral)    Resp 18    LMP  (LMP Unknown)    SpO2 96%   Constitutional: Well developed, well nourished, no acute distress Eyes: PERRL, EOMI, conjunctiva normal bilaterally HENT: Normocephalic, atraumatic,mucus membranes moist.  Positive nasal congestion.  No maxillary, frontal sinus tenderness Respiratory: Clear to auscultation bilaterally, no rales, no wheezing, no rhonchi Cardiovascular: Regular tachycardia, no murmurs, no gallops, no rubs GI: Nondistended skin: No rash, skin  intact Musculoskeletal: No deformities Neurologic: Alert & oriented x 3, CN III-XII grossly intact, no motor deficits, sensation grossly intact Psychiatric: Speech and behavior appropriate   ED Course   Medications - No data to display  Orders Placed This Encounter  Procedures   SARS CORONAVIRUS 2 (TAT 6-24 HRS) Nasopharyngeal Nasopharyngeal Swab    Standing Status:   Standing    Number of Occurrences:   1   No results found for this or any previous visit (from the past 24 hour(s)). No results found. Results for orders placed or performed during the hospital encounter of 09/01/21  SARS CORONAVIRUS 2 (TAT 6-24 HRS) Nasopharyngeal  Nasopharyngeal Swab   Specimen: Nasopharyngeal Swab  Result Value Ref Range   SARS Coronavirus 2 POSITIVE (A) NEGATIVE     ED Clinical Impression  1. Upper respiratory tract infection, unspecified type   2. Close exposure to COVID-19 virus      ED Assessment/Plan   COVID sent.  However, given her significant exposure, suspect COVID.  Starting her Molnupiravir today.  She will discontinue it if her COVID is negative.  She has MyChart.  In the meantime, Tylenol, Flonase, saline nasal irrigation, Mucinex D, Promethazine DM.  Will give patient a primary care list for ongoing care.  COVID positive.  Plan as above.  Discussed labs, MDM, treatment plan, and plan for follow-up with patient. patient agrees with plan.   Meds ordered this encounter  Medications   fluticasone (FLONASE) 50 MCG/ACT nasal spray    Sig: Place 2 sprays into both nostrils daily.    Dispense:  16 g    Refill:  0   promethazine-dextromethorphan (PROMETHAZINE-DM) 6.25-15 MG/5ML syrup    Sig: Take 5 mLs by mouth 4 (four) times daily as needed for cough.    Dispense:  118 mL    Refill:  0   molnupiravir EUA (LAGEVRIO) 200 mg CAPS capsule    Sig: Take 4 capsules (800 mg total) by mouth 2 (two) times daily for 5 days.    Dispense:  40 capsule    Refill:  0      *This clinic note was created using [employer]. Therefore, there may be occasional mistakes despite careful proofreading. ?    Melynda Ripple, MD 09/02/21 [phone]

## 2021-09-01 NOTE — Discharge Instructions (Addendum)
Finish the [employer], even if you feel better.  Continue it if your COVID is negative.  In the meantime, 1000 mg of Tylenol 3-4 times a day as needed, Flonase, saline nasal irrigation with a NeilMed sinus rinse and distilled water as often as you want, Mucinex D for the nasal congestion, sinus pain/pressure.  Promethazine DM for the cough.  Below is a list of primary care practices who are taking new patients for you to follow-up with.  [facility] -multiple locations.  See website at [email or web address]  [facility] internal medicine clinic Ground Floor - [facility], [place], [place], [place] [address] [phone]  [facility] at [place] [facility] [address] [place], [place] [address] [phone]  [place] and [place], [place] [address] [phone]  [facility] [phone] [place] [address]  [facility]: [place]  [phone]  [facility] Family [facility]: [address] [place]  [phone]  [place] primary care : [address] [place] [phone]  [facility] Primary Care: [address] [place] [address] [phone]  [facility]: [place] [phone]  Dr. Blanchie Serve [place] [address]  [phone]  Go to www.goodrx.com  or www.costplusdrugs.com to look up your medications. This will give you a list of where you can find your prescriptions at the most affordable prices. Or ask the pharmacist what the cash price is, or if they have any other discount programs available to help make your medication more affordable. This  can be less expensive than what you would pay with insurance.

## 2021-09-02 ENCOUNTER — Ambulatory Visit (HOSPITAL_COMMUNITY): Payer: Self-pay

## 2021-09-03 ENCOUNTER — Other Ambulatory Visit: Payer: Self-pay | Admitting: Hematology and Oncology

## 2021-09-07 ENCOUNTER — Ambulatory Visit: Payer: No Typology Code available for payment source | Admitting: Internal Medicine

## 2021-09-07 ENCOUNTER — Other Ambulatory Visit: Payer: Self-pay

## 2021-09-07 ENCOUNTER — Ambulatory Visit (INDEPENDENT_AMBULATORY_CARE_PROVIDER_SITE_OTHER): Payer: No Typology Code available for payment source | Admitting: Internal Medicine

## 2021-09-07 ENCOUNTER — Encounter: Payer: Self-pay | Admitting: Internal Medicine

## 2021-09-07 VITALS — BP 106/66 | HR 67 | Temp 97.6°F | Ht 66.0 in

## 2021-09-07 DIAGNOSIS — D473 Essential (hemorrhagic) thrombocythemia: Secondary | ICD-10-CM | POA: Diagnosis not present

## 2021-09-07 DIAGNOSIS — Z23 Encounter for immunization: Secondary | ICD-10-CM

## 2021-09-07 DIAGNOSIS — Z1231 Encounter for screening mammogram for malignant neoplasm of breast: Secondary | ICD-10-CM | POA: Insufficient documentation

## 2021-09-07 DIAGNOSIS — U071 COVID-19: Secondary | ICD-10-CM

## 2021-09-07 DIAGNOSIS — K5520 Angiodysplasia of colon without hemorrhage: Secondary | ICD-10-CM | POA: Diagnosis not present

## 2021-09-07 DIAGNOSIS — Z0001 Encounter for general adult medical examination with abnormal findings: Secondary | ICD-10-CM | POA: Diagnosis not present

## 2021-09-07 DIAGNOSIS — I341 Nonrheumatic mitral (valve) prolapse: Secondary | ICD-10-CM

## 2021-09-07 DIAGNOSIS — K219 Gastro-esophageal reflux disease without esophagitis: Secondary | ICD-10-CM

## 2021-09-07 NOTE — Progress Notes (Signed)
Subjective:  Patient ID: Kristina Osborne, female    DOB: November 22, 1950  Age: 71 y.o. MRN: [id number]  CC: Annual Exam and URI  This visit occurred during the SARS-CoV-2 public health emergency.  Safety protocols were in place, including screening questions prior to the visit, additional usage of staff PPE, and extensive cleaning of exam room while observing appropriate contact time as indicated for disinfecting solutions.    HPI [name] presents for a CPX and to establish.  She was diagnosed with COVID 10 days ago.  She has an improving nonproductive cough.  She complains of fatigue but denies shortness of breath, wheezing, night sweats, fever, chills, or hemoptysis.  History [name] has a past medical history of Congenital malformation of intestinal fixation, Gastritis, Gastroesophageal reflux disease without esophagitis (09/10/2021), IDA (iron deficiency anemia), Mitral valve prolapse, and Thrombocytosis.   She has a past surgical history that includes Small intestine surgery (1994).   Her family history includes Alcoholism in her half-sister; Healthy in her sister; Heart disease in her mother; Valvular heart disease in her mother.She reports that she has quit smoking. Her smoking use included cigarettes. She has never used smokeless tobacco. She reports that she does not currently use alcohol. She reports that she does not use drugs.  Outpatient Medications Prior to Visit  Medication Sig Dispense Refill   famotidine (PEPCID) 40 MG tablet Take 1 tablet (40 mg total) by mouth daily. 90 tablet 0   fluticasone (FLONASE) 50 MCG/ACT nasal spray Place 2 sprays into both nostrils daily. 16 g 0   folic acid (FOLVITE) 1 MG tablet TAKE 1 TABLET BY MOUTH EVERY DAY 30 tablet 1   hydroxyurea (HYDREA) 500 MG capsule TAKE 2 CAPS ALTERNATING WITH 3 CAPS EVERY OTHER DAY W/FOOD TO MINIMIZE GI EFFECTS 60 capsule 1   propranolol (INDERAL) 10 MG tablet propranolol 10 mg tablet 30 tablet 1    sucralfate (CARAFATE) 1 g tablet Take 1 tablet (1 g total) by mouth 4 (four) times daily -  with meals and at bedtime. 360 tablet 0   promethazine-dextromethorphan (PROMETHAZINE-DM) 6.25-15 MG/5ML syrup Take 5 mLs by mouth 4 (four) times daily as needed for cough. 118 mL 0   No facility-administered medications prior to visit.    ROS Review of Systems  Constitutional:  Negative for appetite change, diaphoresis, fatigue and unexpected weight change.  HENT: Negative.  Negative for sore throat and trouble swallowing.   Eyes: Negative.   Respiratory:  Positive for cough. Negative for chest tightness and wheezing.   Cardiovascular:  Negative for chest pain, palpitations and leg swelling.  Gastrointestinal:  Negative for abdominal pain, blood in stool, constipation, diarrhea and vomiting.  Endocrine: Negative.   Genitourinary: Negative.  Negative for difficulty urinating and dyspareunia.  Musculoskeletal: Negative.  Negative for arthralgias and myalgias.  Skin: Negative.   Neurological: Negative.  Negative for dizziness and weakness.  Hematological:  Negative for adenopathy. Does not bruise/bleed easily.  Psychiatric/Behavioral: Negative.     Objective:  BP 106/66 (BP Location: Left Arm, Patient Position: Sitting, Cuff Size: Large)    Pulse 67    Temp 97.6 F (36.4 C) (Oral)    Ht 5\' 6"  (1.676 m)    LMP  (LMP Unknown)    SpO2 96%   Physical Exam Vitals reviewed.  Constitutional:      General: She is not in acute distress.    Appearance: Normal appearance. She is not ill-appearing, toxic-appearing or diaphoretic.  HENT:     Nose: Nose  normal.     Mouth/Throat:     Mouth: Mucous membranes are moist.  Eyes:     General: No scleral icterus.    Conjunctiva/sclera: Conjunctivae normal.  Cardiovascular:     Rate and Rhythm: Normal rate and regular rhythm.     Heart sounds: No murmur heard. Pulmonary:     Effort: Pulmonary effort is normal.     Breath sounds: No stridor. No wheezing,  rhonchi or rales.  Abdominal:     General: Abdomen is flat.     Palpations: There is no mass.     Tenderness: There is no abdominal tenderness. There is no guarding.     Hernia: No hernia is present.  Musculoskeletal:        General: Normal range of motion.     Cervical back: Neck supple.     Right lower leg: No edema.     Left lower leg: No edema.  Lymphadenopathy:     Cervical: No cervical adenopathy.  Skin:    General: Skin is dry.     Coloration: Skin is not pale.     Findings: No rash.  Neurological:     General: No focal deficit present.     Mental Status: She is alert.  Psychiatric:        Mood and Affect: Mood normal.        Behavior: Behavior normal.    Lab Results  Component Value Date   WBC 5.2 07/28/2021   HGB 13.3 07/28/2021   HCT 39.1 07/28/2021   PLT 214 07/28/2021   GLUCOSE 92 07/28/2021   ALT 22 07/28/2021   AST 25 07/28/2021   NA 140 07/28/2021   K 4.1 07/28/2021   CL 105 07/28/2021   CREATININE 0.82 07/28/2021   BUN 11 07/28/2021   CO2 25 07/28/2021     Assessment & Plan:   [name] was seen today for annual exam and uri.  Diagnoses and all orders for this visit:  Acute COVID-19- This is resolving.  Visit for screening mammogram -     MM DIGITAL SCREENING BILATERAL; Future  Encounter for general adult medical examination with abnormal findings- Exam completed, labs reviewed, vaccines reviewed and updated, cancer screenings addressed, patient education was given.  Need for vaccination -     Pneumococcal conjugate vaccine 20-valent  Essential thrombocytosis ([place])- During her recent visit with hematology her platelet count was normal.  She is doing well on hydroxyurea.  AVM (arteriovenous malformation) of small bowel, acquired -     Ambulatory referral to Gastroenterology  Gastroesophageal reflux disease without esophagitis- She is doing well on the H2 blocker.  MVP (mitral valve prolapse)- She is asymptomatic.  Will continue the  current dose of propanolol.   I have discontinued Elnoria Howard promethazine-dextromethorphan. I am also having her maintain her propranolol, sucralfate, famotidine, hydroxyurea, fluticasone, and folic acid.  No orders of the defined types were placed in this encounter.    Follow-up: Return in about 6 months (around 03/07/2022).  Scarlette Calico, MD

## 2021-09-07 NOTE — Patient Instructions (Signed)

## 2021-09-10 ENCOUNTER — Encounter: Payer: Self-pay | Admitting: Internal Medicine

## 2021-09-10 DIAGNOSIS — D473 Essential (hemorrhagic) thrombocythemia: Secondary | ICD-10-CM | POA: Insufficient documentation

## 2021-09-10 DIAGNOSIS — K219 Gastro-esophageal reflux disease without esophagitis: Secondary | ICD-10-CM | POA: Insufficient documentation

## 2021-09-10 DIAGNOSIS — I341 Nonrheumatic mitral (valve) prolapse: Secondary | ICD-10-CM | POA: Insufficient documentation

## 2021-09-10 DIAGNOSIS — K5520 Angiodysplasia of colon without hemorrhage: Secondary | ICD-10-CM | POA: Insufficient documentation

## 2021-09-10 DIAGNOSIS — Z23 Encounter for immunization: Secondary | ICD-10-CM | POA: Insufficient documentation

## 2021-09-10 HISTORY — DX: Gastro-esophageal reflux disease without esophagitis: K21.9

## 2021-09-20 ENCOUNTER — Ambulatory Visit (INDEPENDENT_AMBULATORY_CARE_PROVIDER_SITE_OTHER): Payer: No Typology Code available for payment source | Admitting: Gastroenterology

## 2021-09-20 ENCOUNTER — Encounter: Payer: Self-pay | Admitting: Gastroenterology

## 2021-09-20 VITALS — BP 110/68 | HR 94

## 2021-09-20 DIAGNOSIS — K2970 Gastritis, unspecified, without bleeding: Secondary | ICD-10-CM

## 2021-09-20 DIAGNOSIS — Z86010 Personal history of colonic polyps: Secondary | ICD-10-CM | POA: Diagnosis not present

## 2021-09-20 DIAGNOSIS — K5520 Angiodysplasia of colon without hemorrhage: Secondary | ICD-10-CM

## 2021-09-20 MED ORDER — FAMOTIDINE 40 MG PO TABS: 40.0000 mg | ORAL_TABLET | Freq: Every day | ORAL | 3 refills | Status: DC

## 2021-09-20 MED ORDER — SUCRALFATE 1 G PO TABS: 1.0000 g | ORAL_TABLET | Freq: Three times a day (TID) | ORAL | 3 refills | Status: DC

## 2021-09-20 MED ORDER — SUCRALFATE 1 G PO TABS: 1.0000 g | ORAL_TABLET | Freq: Three times a day (TID) | ORAL | 11 refills | Status: DC

## 2021-09-20 MED ORDER — FAMOTIDINE 40 MG PO TABS: 40.0000 mg | ORAL_TABLET | Freq: Every day | ORAL | 11 refills | Status: DC

## 2021-09-20 NOTE — Progress Notes (Signed)
History of Present Illness: This is a 71 year old female referred by Kristina Slick, MD for a history of iron deficiency anemia and history of recurrent occult and overt GI bleeding.  She has most recently been evaluated by Kristina Bouchard, MD in [place].  Unfortunately I do not have records available today.  Care Everywhere provided the following summary of her endoscopic and GI abdominal imaging studies as below.  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  She states she subsequently underwent a segmental jejunal resection for bleeding AVMs but does not recall the date.  Following that she has had episodes of GI bleeding with melena and maroon stool as well as refractory occult chronic blood loss with recurrent iron deficiency anemia treated with iron infusions and blood transfusions.  Subsequent GI evaluations have not showed a source for her most recent bleeding.  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  For the past year she has not had problems with occult or overt gastrointestinal bleeding or IDA.  She states she has had recurrent problems with bleeding when she takes aspirin so she avoids aspirin.  She relates a history of GERD and gastritis. Her symptoms have been controlled with famotidine and sucralfate.  Currently she has no gastrointestinal complaints.  Iron studies performed in August 2022 were normal.  CBC in December showed a hemoglobin of 13.3 and an MCV of 123.3. She is followed by Kristina Rutherford, MD for essential thrombocytosis. Denies weight loss, abdominal pain, constipation, diarrhea, change in stool caliber, melena, hematochezia, nausea, vomiting, dysphagia, reflux symptoms, chest pain.     Date Name Performed by    06/17/2020 Upper Endoscopy Information not available   06/16/2020 Nuclear Medicine Procedure Notes: Bleeding scan negative Information not available   12/08/2019 Push Enteroscopy Information not available    10/14/2019 Enteroscopy Notes: DBE Information not available   08/06/2019 Enteroscopy Notes: Push exam Information not available   06/25/2019 Magnetic Resonance Imaging Angiography of Abdominal Visceral Artery Information not available   06/24/2019 Capsule Endoscopy Information not available   05/29/2019 Upper Endoscopy Information not available   05/15/2019 Colonoscopy Notes: 2 polyps Information not available   08/28/2017 Knee Arthroscopy/surgery Information not available   02/18/2007 Upper Endoscopy Information not available   08/28/1996 Colonoscopy Information not available   07/24/1996 Small Bowel Endoscopy Notes: [facility] enteroscopy Information not available   08/11/1994 Abdominal Surgery Notes: Right hemicolectomy for bleeding AVMs Information not available   07/31/1994 Small Bowel Endoscopy Notes: Push enteroscopy Information not available   06/28/1994 Upper Endoscopy        Allergies  Allergen Reactions   Aspirin Other (See Comments)    "Due to intestinal issue"   Sulfa Antibiotics    Outpatient Medications Prior to Visit  Medication Sig Dispense Refill   folic acid (FOLVITE) 1 MG tablet TAKE 1 TABLET BY MOUTH EVERY DAY 30 tablet 1   hydroxyurea (HYDREA) 500 MG capsule TAKE 2 CAPS ALTERNATING WITH 3 CAPS EVERY OTHER DAY W/FOOD TO MINIMIZE GI EFFECTS 60 capsule 1   propranolol (INDERAL) 10 MG tablet propranolol 10 mg tablet 30 tablet 1   famotidine (PEPCID) 40 MG tablet Take 1 tablet (40 mg total) by mouth daily. 90 tablet 0   fluticasone (FLONASE) 50 MCG/ACT nasal spray Place 2 sprays into both nostrils daily. 16 g 0   sucralfate (CARAFATE) 1 g tablet Take 1 tablet (1 g total) by mouth 4 (four) times daily -  with meals and at bedtime. 360 tablet 0   No facility-administered medications prior to visit.   Past Medical History:  Diagnosis Date   Congenital malformation of intestinal fixation    Gastritis    Gastroesophageal reflux disease without esophagitis  09/10/2021   IDA (iron deficiency anemia)    Mitral valve prolapse    Thrombocytosis    Past Surgical History:  Procedure Laterality Date   SMALL INTESTINE SURGERY  1994   Social History   Socioeconomic History   Marital status: Married    Spouse name: Not on file   Number of children: 0   Years of education: Not on file   Highest education level: Not on file  Occupational History   Not on file  Tobacco Use   Smoking status: Former    Years: 15.00    Types: Cigarettes   Smokeless tobacco: Never  Vaping Use   Vaping Use: Never used  Substance and Sexual Activity   Alcohol use: Not Currently   Drug use: Never   Sexual activity: Yes    Partners: Male  Other Topics Concern   Not on file  Social History Narrative   Not on file   Social Determinants of Health   Financial Resource Strain: Not on file  Food Insecurity: Not on file  Transportation Needs: Not on file  Physical Activity: Not on file  Stress: Not on file  Social Connections: Not on file   Family History  Problem Relation Age of Onset   Heart disease Mother    Valvular heart disease Mother    Healthy Sister    Alcoholism Half-Sister        Review of Systems: Pertinent positive and negative review of systems were noted in the above HPI section. All other review of systems were otherwise negative.    Physical Exam: General: Well developed, well nourished, no acute distress Head: Normocephalic and atraumatic Eyes: Sclerae anicteric, EOMI Ears: Normal auditory acuity Mouth: Not examined, mask on during Covid-19 pandemic Neck: Supple, no masses or thyromegaly Lungs: Clear throughout to auscultation Heart: Regular rate and rhythm; no murmurs, rubs or bruits Abdomen: Soft, non tender and non distended. No masses, hepatosplenomegaly or hernias noted. Normal Bowel sounds Rectal: Not done Musculoskeletal: Symmetrical with no gross deformities  Skin: No lesions on visible extremities Pulses:  Normal  pulses noted Extremities: No clubbing, cyanosis, edema or deformities noted Neurological: Alert oriented x 4, grossly nonfocal Cervical Nodes:  No significant cervical adenopathy Inguinal Nodes: No significant inguinal adenopathy Psychological:  Alert and cooperative. Normal mood and affect   Assessment and Recommendations:  History of overt and occult gastrointestinal bleeding secondary to AVMs.  Status post right hemicolectomy and status post segmental jejunal resection.  History of iron deficiency anemia.  Dr. Lorenso Courier will monitor her CBC for signs of recurrent iron deficiency.  Patient will evaluate for overt signs of GI bleeding and contact us if they recur.  We discussed the possibility of repeating colonoscopy, push enteroscopy and VCE.  If DB enteroscopy was felt to be necessary referral to [facility] would be required.  Personal history of colon polyps, type unknown.  Await records to determine appropriate colonoscopy surveillance interval. GERD, gastritis.  Continue famotidine 40 mg daily and sucralfate 1 g p.o. 4 times daily. REV in 1 year.  Essential thrombocytosis, followed by Dr. Lorenso Courier.   cc: Kristina Slick, MD [address],  [place] [address]

## 2021-09-20 NOTE — Patient Instructions (Signed)
We have sent the following medications to your pharmacy for you to pick up at your convenience: famotidine and carafate.   We recommend Dr. Ardeth Perfect or Dr. Jacalyn Lefevre at [facility] for a primary care physician. We are unsure if they are taking new patients. Call their office at [phone].  The [facility] providers would like to encourage you to use [facility]) to communicate with providers for non-urgent requests or questions.  Due to long hold times on the telephone, sending your provider a message by [facility] may be a faster and more efficient way to get a response.  Please allow 48 business hours for a response.  Please remember that this is for non-urgent requests.   Thank you for choosing me and [facility].  Pricilla Riffle. Dagoberto Ligas., MD., Marval Regal

## 2021-10-01 ENCOUNTER — Other Ambulatory Visit: Payer: Self-pay | Admitting: Hematology and Oncology

## 2021-10-05 ENCOUNTER — Emergency Department (HOSPITAL_BASED_OUTPATIENT_CLINIC_OR_DEPARTMENT_OTHER): Payer: No Typology Code available for payment source | Admitting: Radiology

## 2021-10-05 ENCOUNTER — Ambulatory Visit (HOSPITAL_COMMUNITY)
Admission: EM | Admit: 2021-10-05 | Discharge: 2021-10-05 | Disposition: A | Discharge status: Other Acute Inpatient Hospital | Payer: No Typology Code available for payment source | Attending: Internal Medicine | Admitting: Internal Medicine

## 2021-10-05 ENCOUNTER — Other Ambulatory Visit: Payer: Self-pay

## 2021-10-05 ENCOUNTER — Encounter (HOSPITAL_COMMUNITY): Payer: Self-pay | Admitting: *Deleted

## 2021-10-05 ENCOUNTER — Emergency Department (HOSPITAL_BASED_OUTPATIENT_CLINIC_OR_DEPARTMENT_OTHER)
Admission: EM | Admit: 2021-10-05 | Discharge: 2021-10-05 | Disposition: A | Discharge status: Home / Self Care | Payer: No Typology Code available for payment source | Attending: Emergency Medicine | Admitting: Emergency Medicine

## 2021-10-05 DIAGNOSIS — M542 Cervicalgia: Secondary | ICD-10-CM

## 2021-10-05 DIAGNOSIS — R002 Palpitations: Secondary | ICD-10-CM

## 2021-10-05 DIAGNOSIS — D75839 Thrombocytosis, unspecified: Secondary | ICD-10-CM | POA: Insufficient documentation

## 2021-10-05 DIAGNOSIS — I493 Ventricular premature depolarization: Secondary | ICD-10-CM

## 2021-10-05 LAB — CBC
HCT: 38.9 % (ref 36.0–46.0)
Hemoglobin: 13.2 g/dL (ref 12.0–15.0)
MCH: 41.4 pg — ABNORMAL HIGH (ref 26.0–34.0)
MCHC: 33.9 g/dL (ref 30.0–36.0)
MCV: 121.9 fL — ABNORMAL HIGH (ref 80.0–100.0)
Platelets: 522 10*3/uL — ABNORMAL HIGH (ref 150–400)
RBC: 3.19 MIL/uL — ABNORMAL LOW (ref 3.87–5.11)
RDW: 13.9 % (ref 11.5–15.5)
WBC: 4.3 10*3/uL (ref 4.0–10.5)
nRBC: 0 % (ref 0.0–0.2)

## 2021-10-05 LAB — BASIC METABOLIC PANEL
Anion gap: 9 (ref 5–15)
BUN: 11 mg/dL (ref 8–23)
CO2: 26 mmol/L (ref 22–32)
Calcium: 10.4 mg/dL — ABNORMAL HIGH (ref 8.9–10.3)
Chloride: 104 mmol/L (ref 98–111)
Creatinine, Ser: 0.72 mg/dL (ref 0.44–1.00)
GFR, Estimated: >60 mL/min (ref >60)
Glucose, Bld: 117 mg/dL — ABNORMAL HIGH (ref 70–99)
Potassium: 4.2 mmol/L (ref 3.5–5.1)
Sodium: 139 mmol/L (ref 135–145)

## 2021-10-05 LAB — TROPONIN I (HIGH SENSITIVITY)
Troponin I (High Sensitivity): <2 ng/L (ref <18)
Troponin I (High Sensitivity): 2 ng/L (ref <18)

## 2021-10-05 MED ORDER — METOPROLOL TARTRATE 25 MG PO TABS: 12.5000 mg | ORAL_TABLET | Freq: Two times a day (BID) | ORAL | 0 refills | Status: DC

## 2021-10-05 MED ORDER — METHOCARBAMOL 500 MG PO TABS: 500.0000 mg | ORAL_TABLET | Freq: Two times a day (BID) | ORAL | 0 refills | Status: DC

## 2021-10-05 NOTE — Discharge Instructions (Addendum)
Patient is going to the emergency room to have further evaluation and the CT scan.  Discussed in detail the risk factors involved not able to rule these out without lab work and CT.

## 2021-10-05 NOTE — ED Triage Notes (Signed)
Present for palpitations x3 days that are persistent, only remitting for 10 min at a time. Tried increasing propanolol to 30 mg yesterday without relief. Was sent her from UC.  H/o mitral valve prolapse, recent covid infection.  Denies SOB, syncope, CP, edema

## 2021-10-05 NOTE — ED Triage Notes (Signed)
For 3 days Pt has had  palpitations . Pt took 30 mg of propranolol  with out help. Pt reports her chirropractor reported the nerves in her neck cause the PVC.

## 2021-10-05 NOTE — ED Provider Notes (Signed)
[facility]    CSN: [id number] Arrival date & time: 10/05/21  0909      History   Chief Complaint Chief Complaint  Patient presents with   Ireegular heart rate    Palpitations    HPI Kristina Osborne is a 71 y.o. female.   Patient presents today with 3 days of increasing heart palpitations.  Patient has a history of MVP and has had some heart palpitations in the past nothing that is frequent.  Patient states that the palpitations are becoming more frequent every few minutes versus just every once in a while.  Denies any chest pain any arm pain no nausea vomiting with this.  Patient did take 30 mg of propanolol with no relief when in the past this has helped relieve symptoms.  Patient did recently have COVID approximately 3 weeks ago.  Patient also has a history of thrombocytosis has not had labs drawn recently is concerning for risk of clots.  Patient continues to have these heart palpitations even when at rest and laying on the bed.   Past Medical History:  Diagnosis Date   Congenital malformation of intestinal fixation    Gastritis    Gastroesophageal reflux disease without esophagitis 09/10/2021   IDA (iron deficiency anemia)    Mitral valve prolapse    Thrombocytosis     Patient Active Problem List   Diagnosis Date Noted   Essential thrombocytosis ([place]) 09/10/2021   Need for vaccination 09/10/2021   AVM (arteriovenous malformation) of small bowel, 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   MVP (mitral valve prolapse) 09/10/2021   Gastroesophageal reflux disease without esophagitis 09/10/2021   Acute COVID-19 09/07/2021   Visit for screening mammogram 09/07/2021   Encounter for general adult medical examination with abnormal findings 09/07/2021    Past Surgical History:  Procedure Laterality Date   SMALL INTESTINE SURGERY  1994    OB History   No obstetric history on file.      Home Medications    Prior to Admission medications   Medication Sig Start Date End Date  Taking? Authorizing Provider  famotidine (PEPCID) 40 MG tablet Take 1 tablet (40 mg total) by mouth daily. 09/20/21   Ladene Artist, MD  folic acid (FOLVITE) 1 MG tablet TAKE 1 TABLET BY MOUTH EVERY DAY 09/04/21   Orson Slick, MD  hydroxyurea (HYDREA) 500 MG capsule TAKE 2 CAPS ALTERNATING WITH 3 CAPS EVERY OTHER DAY W/FOOD TO MINIMIZE GI EFFECTS 08/26/21   Orson Slick, MD  propranolol (INDERAL) 10 MG tablet TAKE AS DIRECTED 10/02/21   Orson Slick, MD  sucralfate (CARAFATE) 1 g tablet Take 1 tablet (1 g total) by mouth 4 (four) times daily -  with meals and at bedtime. 09/20/21 12/19/21  Ladene Artist, MD    Family History Family History  Problem Relation Age of Onset   Heart disease Mother    Valvular heart disease Mother    Healthy Sister    Alcoholism Half-Sister     Social History Social History   Tobacco Use   Smoking status: Former    Years: 15.00    Types: Cigarettes   Smokeless tobacco: Never  Vaping Use   Vaping Use: Never used  Substance Use Topics   Alcohol use: Not Currently   Drug use: Never     Allergies   Aspirin and Sulfa antibiotics   Review of Systems Review of Systems  Constitutional:  Negative for fever.  HENT: Negative.  Respiratory:  Negative for cough and shortness of breath.   Cardiovascular:  Positive for chest pain and palpitations. Negative for leg swelling.       Irregular heartbeat consistent patient is feeling it more frequently every few minutes even at rest  Gastrointestinal: Negative.   Genitourinary: Negative.   Skin: Negative.   Neurological: Negative.  Negative for dizziness, weakness and headaches.    Physical Exam Triage Vital Signs ED Triage Vitals  Enc Vitals Group     BP 10/05/21 0959 122/75     Pulse Rate 10/05/21 0959 79     Resp 10/05/21 0959 18     Temp 10/05/21 0959 98 F (36.7 C)     Temp src --      SpO2 10/05/21 0959 100 %     Weight --      Height --      Head Circumference --       Peak Flow --      Pain Score 10/05/21 0957 0     Pain Loc --      Pain Edu? --      Excl. in [place]? --    No data found.  Updated Vital Signs BP 122/75    Pulse 79    Temp 98 F (36.7 C)    Resp 18    LMP  (LMP Unknown)    SpO2 100%   Visual Acuity Right Eye Distance:   Left Eye Distance:   Bilateral Distance:    Right Eye Near:   Left Eye Near:    Bilateral Near:     Physical Exam Constitutional:      Appearance: Normal appearance. She is normal weight.  Cardiovascular:     Rate and Rhythm: Rhythm irregular.     Heart sounds: Heart sounds are distant.     Comments: Manual palpation irregular heart palpation proximately every 2 to 3 minutes. Pulmonary:     Effort: Pulmonary effort is normal.     Breath sounds: Normal breath sounds.  Abdominal:     General: Abdomen is flat.  Musculoskeletal:     Right lower leg: No edema.     Left lower leg: No edema.  Neurological:     General: No focal deficit present.     Mental Status: She is alert.     UC Treatments / Results  Labs (all labs ordered are listed, but only abnormal results are displayed) Labs Reviewed - No data to display  EKG   Radiology DG Chest 2 View  Result Date: 10/05/2021 CLINICAL DATA:  Palpitations for 3 days EXAM: CHEST - 2 VIEW COMPARISON:  None FINDINGS: Normal heart size, mediastinal contours, and pulmonary vascularity. Post CABG. Atherosclerotic calcification aorta. Minimal subsegmental atelectasis at RIGHT base. Lungs otherwise clear. No pulmonary infiltrate, pleural effusion, or pneumothorax. Bones demineralized. IMPRESSION: Minimal RIGHT basilar atelectasis. Aortic Atherosclerosis (ICD10-I70.0). Electronically Signed   By: Lavonia Dana M.D.   On: 10/05/2021 11:32    Procedures Procedures (including critical care time)  Medications Ordered in UC Medications - No data to display  Initial Impression / Assessment and Plan / UC Course  I have reviewed the triage vital signs and the nursing  notes.  Pertinent labs & imaging results that were available during my care of the patient were reviewed by me and considered in my medical decision making (see chart for details).     Discussed with patient and her husband that I am not able to rule out other risk  factors involved with the heart palpitations since these are so frequent and with medication there is no relief.  She needs to be seen in the emergency room for CT scan and further work-up.  Expressed to patient I am not able to rule out a clot patient is at a higher risk due to his medical history.  EKG completed does show slight depression but patient does not have an old EKG to compare to. Patient understands the plan of care and wants to have her husband drive her to the emergency room does not want ambulance transport.  Patient states that she does not want to wait in [facility] ER.  She want to go to [facility] freestanding ER.  I educated patient that if she needs to see cardiology they will need to transport her or she need to be seen at [facility].  She understands this. Final Clinical Impressions(s) / UC Diagnoses   Final diagnoses:  Palpitations     Discharge Instructions      Patient is going to the emergency room to have further evaluation and the CT scan.  Discussed in detail the risk factors involved not able to rule these out without lab work and CT.     ED Prescriptions   None    PDMP not reviewed this encounter.   Marney Setting, NP 10/05/21 1332

## 2021-10-05 NOTE — ED Provider Notes (Signed)
[facility] EMERGENCY DEPT  Provider Note  CSN: [id number] Arrival date & time: 10/05/21 1102  History Chief Complaint  Patient presents with   Palpitations    Kristina Osborne is a 71 y.o. female with history of PVCs and mitral valve prolapse typically well controlled with PRN Inderal, reports several days of increased frequency of PVCs. She had Covid a few weeks ago and also has had some soreness in her neck after visiting the hair salon (for which she is seeing a chiropractor) and is concerned they may all be related. She has taken additional doses of inderal yesterday without improvement. She has not had any chest pain, SOB, fever, orthopnea or DOE. She went to UC and then sent to the ED for evaluation    Home Medications Prior to Admission medications   Medication Sig Start Date End Date Taking? Authorizing Provider  famotidine (PEPCID) 40 MG tablet Take 1 tablet (40 mg total) by mouth daily. 09/20/21  Yes Ladene Artist, MD  folic acid (FOLVITE) 1 MG tablet TAKE 1 TABLET BY MOUTH EVERY DAY 09/04/21  Yes Orson Slick, MD  hydroxyurea (HYDREA) 500 MG capsule TAKE 2 CAPS ALTERNATING WITH 3 CAPS EVERY OTHER DAY W/FOOD TO MINIMIZE GI EFFECTS 08/26/21  Yes Orson Slick, MD  methocarbamol (ROBAXIN) 500 MG tablet Take 1 tablet (500 mg total) by mouth 2 (two) times daily. 10/05/21  Yes Truddie Hidden, MD  metoprolol tartrate (LOPRESSOR) 25 MG tablet Take 0.5 tablets (12.5 mg total) by mouth 2 (two) times daily. 10/05/21 11/04/21 Yes Truddie Hidden, MD  sucralfate (CARAFATE) 1 g tablet Take 1 tablet (1 g total) by mouth 4 (four) times daily -  with meals and at bedtime. 09/20/21 12/19/21 Yes Ladene Artist, MD     Allergies    Aspirin and Sulfa antibiotics   Review of Systems   Review of Systems Please see HPI for pertinent positives and negatives  Physical Exam BP 99/69    Pulse 70    Temp 98 F (36.7 C)    Resp 16    Wt 72.6 kg    LMP  (LMP Unknown)     SpO2 100%    BMI 25.82 kg/m   Physical Exam Vitals and nursing note reviewed.  Constitutional:      Appearance: Normal appearance.  HENT:     Head: Normocephalic and atraumatic.     Nose: Nose normal.     Mouth/Throat:     Mouth: Mucous membranes are moist.  Eyes:     Extraocular Movements: Extraocular movements intact.     Conjunctiva/sclera: Conjunctivae normal.  Cardiovascular:     Rate and Rhythm: Normal rate.     Comments: Occasional PVC Pulmonary:     Effort: Pulmonary effort is normal.     Breath sounds: Normal breath sounds.  Abdominal:     General: Abdomen is flat.     Palpations: Abdomen is soft.     Tenderness: There is no abdominal tenderness.  Musculoskeletal:        General: Tenderness (soreness in bilateral cervical paraspinal muscles) present. No swelling. Normal range of motion.     Cervical back: Neck supple.  Skin:    General: Skin is warm and dry.  Neurological:     General: No focal deficit present.     Mental Status: She is alert.  Psychiatric:        Mood and Affect: Mood normal.    ED Results /  Procedures / Treatments   EKG None EKG not crossing from MUSE shows SR with PVCs, otherwise normal.   Procedures Procedures  Medications Ordered in the ED Medications - No data to display  Initial Impression and Plan  Patient here with MSK neck pain and increased frequency of PVCs, her labs done in triage showed mild thrombocytosis (history of same, seeing hematology).  BMP is unremarkable. CXR is clear. Trop is neg x 2. Patient reassured that her neck pain is not related to her PVCs and that they are benign. Given poor symptom control with Inderal, will switch to a low dose of Metoprolol and refer to Cardiology for evaluation. Rx for muscle relaxer for her neck pain.   ED Course       MDM Rules/Calculators/A&P Medical Decision Making Problems Addressed: Neck pain: acute illness or injury PVC's (premature ventricular contractions): chronic  illness or injury with exacerbation, progression, or side effects of treatment  Amount and/or Complexity of Data Reviewed Labs: ordered. Decision-making details documented in ED Course. Radiology: ordered and independent interpretation performed. Decision-making details documented in ED Course. ECG/medicine tests: ordered and independent interpretation performed. Decision-making details documented in ED Course.    Final Clinical Impression(s) / ED Diagnoses Final diagnoses:  PVC's (premature ventricular contractions)  Neck pain    Rx / DC Orders ED Discharge Orders          Ordered    metoprolol tartrate (LOPRESSOR) 25 MG tablet  2 times daily        10/05/21 1518    methocarbamol (ROBAXIN) 500 MG tablet  2 times daily        10/05/21 1518             Truddie Hidden, MD 10/05/21 [id number]

## 2021-10-07 ENCOUNTER — Ambulatory Visit (INDEPENDENT_AMBULATORY_CARE_PROVIDER_SITE_OTHER): Payer: No Typology Code available for payment source | Admitting: Internal Medicine

## 2021-10-07 ENCOUNTER — Encounter: Payer: Self-pay | Admitting: Internal Medicine

## 2021-10-07 ENCOUNTER — Other Ambulatory Visit: Payer: Self-pay

## 2021-10-07 VITALS — BP 102/64 | HR 63 | Ht 66.0 in | Wt 160.0 lb

## 2021-10-07 DIAGNOSIS — I493 Ventricular premature depolarization: Secondary | ICD-10-CM

## 2021-10-07 DIAGNOSIS — I341 Nonrheumatic mitral (valve) prolapse: Secondary | ICD-10-CM | POA: Diagnosis not present

## 2021-10-07 DIAGNOSIS — I340 Nonrheumatic mitral (valve) insufficiency: Secondary | ICD-10-CM | POA: Diagnosis not present

## 2021-10-07 MED ORDER — METOPROLOL TARTRATE 25 MG PO TABS: 12.5000 mg | ORAL_TABLET | Freq: Two times a day (BID) | ORAL | 3 refills | Status: DC

## 2021-10-07 NOTE — Progress Notes (Signed)
OFFICE CONSULT NOTE  Chief Complaint:  PVCs  Primary Care Physician: Janith Lima, MD  HPI:  Kristina Osborne is a 71 y.o. female who is being seen today for the evaluation of PVCs at the request of Truddie Hidden, MD. this is a pleasant 71 year old female who recently came to the area from [place].  She is a Animal nutritionist.  He reports a longstanding history of palpitations and was diagnosed with mitral valve prolapse a number of years ago.  She has used short acting Inderal between 10 and 30 mg as needed in the past however earlier this week she was seen in the emergency department for incessant PVCs.  She said that over the weekend she had her hair done and possibly sustained neck injury while her head was extended over the sink.  Ultimately she followed up with her chiropractor about this.  In the ER she was noted to have frequent PVCs.  It was recommended that she stop her Inderal and switch to metoprolol tartrate 12.5 mg twice daily.  She has not taken that for the last 2 days after filling the prescription and notes some improvement in her symptoms.  She had some PVCs yesterday while sitting at her desk however none today.  EKG shows normal sinus rhythm at 63.  She apparently has seen a cardiologist last a couple years ago in [place] and had an echocardiogram.  She had remote stress testing maybe 5 years ago, all of which were reassuring.  She was noted to have some mild regurgitation and prolapse of the mitral valve.  We will request records.  Blood pressure is low normal today 102/64.  She denies chest pain or shortness of breath.  Troponins were negative as part of her ER work-up.  There is family history of heart disease including her mother who had CABG and died of aortic aneurysm subsequent to this.  She has not had recent lipid testing that I could find  PMHx:  Past Medical History:  Diagnosis Date   Congenital malformation of intestinal  fixation    Gastritis    Gastroesophageal reflux disease without esophagitis 09/10/2021   IDA (iron deficiency anemia)    Mitral valve prolapse    Thrombocytosis     Past Surgical History:  Procedure Laterality Date   SMALL INTESTINE SURGERY  1994    FAMHx:  Family History  Problem Relation Age of Onset   Heart disease Mother    Valvular heart disease Mother    Healthy Sister    Alcoholism Half-Sister     SOCHx:   reports that she has quit smoking. Her smoking use included cigarettes. She has never used smokeless tobacco. She reports that she does not currently use alcohol. She reports that she does not use drugs.  ALLERGIES:  Allergies  Allergen Reactions   Aspirin Other (See Comments)    "Due to intestinal issue"   Sulfa Antibiotics     ROS: Pertinent items noted in HPI and remainder of comprehensive ROS otherwise negative.  HOME MEDS: Current Outpatient Medications on File Prior to Visit  Medication Sig Dispense Refill   famotidine (PEPCID) 40 MG tablet Take 1 tablet (40 mg total) by mouth daily. 90 tablet 3   folic acid (FOLVITE) 1 MG tablet TAKE 1 TABLET BY MOUTH EVERY DAY 30 tablet 1   hydroxyurea (HYDREA) 500 MG capsule TAKE 2 CAPS ALTERNATING WITH 3 CAPS EVERY OTHER DAY W/FOOD TO MINIMIZE GI EFFECTS  60 capsule 1   methocarbamol (ROBAXIN) 500 MG tablet Take 1 tablet (500 mg total) by mouth 2 (two) times daily. 20 tablet 0   sucralfate (CARAFATE) 1 g tablet Take 1 tablet (1 g total) by mouth 4 (four) times daily -  with meals and at bedtime. (Patient taking differently: Take 1 g by mouth 2 (two) times daily.) 360 tablet 3   No current facility-administered medications on file prior to visit.    LABS/IMAGING: Results for orders placed or performed during the hospital encounter of 10/05/21 (from the past 48 hour(s))  Basic metabolic panel     Status: Abnormal   Collection Time: 10/05/21 11:13 AM  Result Value Ref Range   Sodium 139 135 - 145 mmol/L   Potassium  4.2 3.5 - 5.1 mmol/L   Chloride 104 98 - 111 mmol/L   CO2 26 22 - 32 mmol/L   Glucose, Bld 117 (H) 70 - 99 mg/dL    Comment: Glucose reference range applies only to samples taken after fasting for at least 8 hours.   BUN 11 8 - 23 mg/dL   Creatinine, Ser 0.72 0.44 - 1.00 mg/dL   Calcium 10.4 (H) 8.9 - 10.3 mg/dL   GFR, Estimated >60 >60 mL/min    Comment: (NOTE) Calculated using the CKD-EPI Creatinine Equation (2021)    Anion gap 9 5 - 15    Comment: Performed at [facility], [address], [place], [place] [address]  CBC     Status: Abnormal   Collection Time: 10/05/21 11:13 AM  Result Value Ref Range   WBC 4.3 4.0 - 10.5 K/uL   RBC 3.19 (L) 3.87 - 5.11 MIL/uL   Hemoglobin 13.2 12.0 - 15.0 g/dL   HCT 38.9 36.0 - 46.0 %   MCV 121.9 (H) 80.0 - 100.0 fL   MCH 41.4 (H) 26.0 - 34.0 pg   MCHC 33.9 30.0 - 36.0 g/dL   RDW 13.9 11.5 - 15.5 %   Platelets 522 (H) 150 - 400 K/uL   nRBC 0.0 0.0 - 0.2 %    Comment: Performed at [facility], [address] [place], [place] [address]  Troponin I (High Sensitivity)     Status: None   Collection Time: 10/05/21 11:13 AM  Result Value Ref Range   Troponin I (High Sensitivity) <2 <18 ng/L    Comment: (NOTE) Elevated high sensitivity troponin I (hsTnI) values and significant  changes across serial measurements may suggest ACS but many other  chronic and acute conditions are known to elevate hsTnI results.  Refer to the "Links" section for chest pain algorithms and additional  guidance. Performed at [facility], [address] [place], [place] [address]   Troponin I (High Sensitivity)     Status: None   Collection Time: 10/05/21  1:40 PM  Result Value Ref Range   Troponin I (High Sensitivity) 2 <18 ng/L    Comment: (NOTE) Elevated high sensitivity troponin I (hsTnI) values and significant  changes across serial measurements may suggest ACS but many other  chronic and acute  conditions are known to elevate hsTnI results.  Refer to the "Links" section for chest pain algorithms and additional  guidance. Performed at [facility], [address] [place], [place] [address]    DG Chest 2 View  Result Date: 10/05/2021 CLINICAL DATA:  Palpitations for 3 days EXAM: CHEST - 2 VIEW COMPARISON:  None FINDINGS: Normal heart size, mediastinal contours, and pulmonary vascularity. Post CABG.  Atherosclerotic calcification aorta. Minimal subsegmental atelectasis at RIGHT base. Lungs otherwise clear. No pulmonary infiltrate, pleural effusion, or pneumothorax. Bones demineralized. IMPRESSION: Minimal RIGHT basilar atelectasis. Aortic Atherosclerosis (ICD10-I70.0). Electronically Signed   By: Lavonia Dana M.D.   On: 10/05/2021 11:32    LIPID PANEL: No results found for: CHOL, TRIG, HDL, CHOLHDL, VLDL, LDLCALC, LDLDIRECT  WEIGHTS: Wt Readings from Last 3 Encounters:  10/07/21 160 lb (72.6 kg)  10/05/21 160 lb (72.6 kg)    VITALS: BP 102/64 (BP Location: Left Arm, Patient Position: Sitting, Cuff Size: Normal)    Pulse 63    Ht 5\' 6"  (1.676 m)    Wt 160 lb (72.6 kg) Comment: Patient refused   LMP  (LMP Unknown)    SpO2 100%    BMI 25.82 kg/m   EXAM: General appearance: alert and no distress Neck: no carotid bruit, no JVD, and thyroid not enlarged, symmetric, no tenderness/mass/nodules Lungs: clear to auscultation bilaterally Heart: regular rate and rhythm, S1, S2 normal, systolic murmur: early systolic 2/6, blowing at apex, and mid systolic click present Abdomen: soft, non-tender; bowel sounds normal; no masses,  no organomegaly Extremities: extremities normal, atraumatic, no cyanosis or edema Pulses: 2+ and symmetric Skin: Skin color, texture, turgor normal. No rashes or lesions Neurologic: Grossly normal Psych: Pleasant, mildly anxious  *Examination chaperoned by Kristina Apley, RN.  EKG: Normal sinus rhythm 63- personally  reviewed  ASSESSMENT: PVCs History of mitral valve prolapse and mitral regurgitation  PLAN: 1.   Ms. Christenberry was noted to have very frequent PVCs and was switched from short acting propranolol to metoprolol tartrate 12.5 mg twice daily.  This should get better coverage throughout the day and is more cardioselective.  I think it will be more helpful for her PVCs and I agree with the change.  We will try to get records from her prior cardiologist include her last echo.  There is a family history of heart disease and I encouraged her to consider further restratification including a calcium score coronary CTA.  She would like to consider that further.  She should also have repeat lipids.  I will plan follow-up in 3 months or sooner as necessary.  Kristina Casino, MD, [facility], [place] Director of the Advanced Lipid Disorders &  Cardiovascular Risk Reduction Clinic Diplomate of the American Board of Clinical Lipidology Attending Cardiologist  Direct Dial: [phone]   Fax: [phone]  Website:  [email or web address] Kristina Osborne 10/07/2021, 11:06 AM

## 2021-10-07 NOTE — Patient Instructions (Addendum)
Medication Instructions:  Your physician recommends that you continue on your current medications as directed. Please refer to the Current Medication list given to you today.  *If you need a refill on your cardiac medications before your next appointment, please call your pharmacy*  Follow-Up: At [facility], you and your health needs are our priority.  As part of our continuing mission to provide you with exceptional heart care, we have created designated Provider Care Teams.  These Care Teams include your primary Cardiologist (physician) and Advanced Practice Providers (APPs -  Physician Assistants and Nurse Practitioners) who all work together to provide you with the care you need, when you need it.  We recommend signing up for the patient portal called "MyChart".  Sign up information is provided on this After Visit Summary.  MyChart is used to connect with patients for Virtual Visits (Telemedicine).  Patients are able to view lab/test results, encounter notes, upcoming appointments, etc.  Non-urgent messages can be sent to your provider as well.   To learn more about what you can do with MyChart, go to [email or web address].    Your next appointment:   3 months with Dr. Debara Pickett   Please call or send MyChart message w/update on the name of the provider we can contact to obtain prior records/echocardiogram. Our fax is [phone]

## 2021-10-10 ENCOUNTER — Other Ambulatory Visit: Payer: Self-pay | Admitting: Hematology and Oncology

## 2021-10-17 ENCOUNTER — Ambulatory Visit (HOSPITAL_BASED_OUTPATIENT_CLINIC_OR_DEPARTMENT_OTHER): Payer: No Typology Code available for payment source | Admitting: Cardiology

## 2021-10-26 ENCOUNTER — Inpatient Hospital Stay (HOSPITAL_BASED_OUTPATIENT_CLINIC_OR_DEPARTMENT_OTHER): Payer: No Typology Code available for payment source | Admitting: Hematology and Oncology

## 2021-10-26 ENCOUNTER — Inpatient Hospital Stay: Payer: No Typology Code available for payment source | Attending: Hematology and Oncology

## 2021-10-26 ENCOUNTER — Other Ambulatory Visit: Payer: Self-pay

## 2021-10-26 ENCOUNTER — Other Ambulatory Visit: Payer: Self-pay | Admitting: Hematology and Oncology

## 2021-10-26 VITALS — BP 132/80 | HR 76 | Temp 98.0°F | Resp 16

## 2021-10-26 DIAGNOSIS — D473 Essential (hemorrhagic) thrombocythemia: Secondary | ICD-10-CM

## 2021-10-26 DIAGNOSIS — D75839 Thrombocytosis, unspecified: Secondary | ICD-10-CM | POA: Diagnosis not present

## 2021-10-26 DIAGNOSIS — Z8719 Personal history of other diseases of the digestive system: Secondary | ICD-10-CM | POA: Insufficient documentation

## 2021-10-26 DIAGNOSIS — D509 Iron deficiency anemia, unspecified: Secondary | ICD-10-CM | POA: Diagnosis present

## 2021-10-26 DIAGNOSIS — Z8249 Family history of ischemic heart disease and other diseases of the circulatory system: Secondary | ICD-10-CM | POA: Insufficient documentation

## 2021-10-26 DIAGNOSIS — Z6372 Alcoholism and drug addiction in family: Secondary | ICD-10-CM | POA: Insufficient documentation

## 2021-10-26 DIAGNOSIS — Z79899 Other long term (current) drug therapy: Secondary | ICD-10-CM | POA: Diagnosis not present

## 2021-10-26 DIAGNOSIS — I700 Atherosclerosis of aorta: Secondary | ICD-10-CM | POA: Diagnosis not present

## 2021-10-26 DIAGNOSIS — Z886 Allergy status to analgesic agent status: Secondary | ICD-10-CM | POA: Diagnosis not present

## 2021-10-26 DIAGNOSIS — Z882 Allergy status to sulfonamides status: Secondary | ICD-10-CM | POA: Diagnosis not present

## 2021-10-26 LAB — CBC WITH DIFFERENTIAL (CANCER CENTER ONLY)
Abs Immature Granulocytes: 0.02 10*3/uL (ref 0.00–0.07)
Basophils Absolute: 0 10*3/uL (ref 0.0–0.1)
Basophils Relative: 1 %
Eosinophils Absolute: 0 10*3/uL (ref 0.0–0.5)
Eosinophils Relative: 0 %
HCT: 39.3 % (ref 36.0–46.0)
Hemoglobin: 13.3 g/dL (ref 12.0–15.0)
Immature Granulocytes: 0 %
Lymphocytes Relative: 8 %
Lymphs Abs: 0.5 10*3/uL — ABNORMAL LOW (ref 0.7–4.0)
MCH: 41.2 pg — ABNORMAL HIGH (ref 26.0–34.0)
MCHC: 33.8 g/dL (ref 30.0–36.0)
MCV: 121.7 fL — ABNORMAL HIGH (ref 80.0–100.0)
Monocytes Absolute: 0.6 10*3/uL (ref 0.1–1.0)
Monocytes Relative: 9 %
Neutro Abs: 5.5 10*3/uL (ref 1.7–7.7)
Neutrophils Relative %: 82 %
Platelet Count: 204 10*3/uL (ref 150–400)
RBC: 3.23 MIL/uL — ABNORMAL LOW (ref 3.87–5.11)
RDW: 13.2 % (ref 11.5–15.5)
WBC Count: 6.7 10*3/uL (ref 4.0–10.5)
nRBC: 0 % (ref 0.0–0.2)

## 2021-10-26 LAB — CMP (CANCER CENTER ONLY)
ALT: 21 U/L (ref 0–44)
AST: 24 U/L (ref 15–41)
Albumin: 4.6 g/dL (ref 3.5–5.0)
Alkaline Phosphatase: 60 U/L (ref 38–126)
Anion gap: 5 (ref 5–15)
BUN: 14 mg/dL (ref 8–23)
CO2: 30 mmol/L (ref 22–32)
Calcium: 10.3 mg/dL (ref 8.9–10.3)
Chloride: 104 mmol/L (ref 98–111)
Creatinine: 0.88 mg/dL (ref 0.44–1.00)
GFR, Estimated: >60 mL/min (ref >60)
Glucose, Bld: 108 mg/dL — ABNORMAL HIGH (ref 70–99)
Potassium: 3.9 mmol/L (ref 3.5–5.1)
Sodium: 139 mmol/L (ref 135–145)
Total Bilirubin: 0.5 mg/dL (ref 0.3–1.2)
Total Protein: 7.6 g/dL (ref 6.5–8.1)

## 2021-10-26 NOTE — Progress Notes (Signed)
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 Lorenso Courier  10/26/2021: WBC 6.7, Hgb 13.3, MCV 121.7, Plt 204  Interval History:  Kristina Osborne 71 y.o. female with medical history significant for essential thrombocytosis who presents for a follow up visit. The patient's last visit was on 07/28/2021. In the interim since the last visit 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  On exam today Kristina Osborne is accompanied by her husband.  She reports she has been well overall interim since her last visit.  She is tolerating the hydroxyurea pills without difficulty.  She continues to take alternating 1000 mg or 1500 mg daily.  She notes her energy levels are good and she denies any ulcers on her mouth or on her ankles.  She reports no difficulty with bleeding or bruising.  She also denies any signs or symptoms concerning for VTE including shortness of breath, leg swelling, or chest pain.  A full 10 point ROS is listed below.   MEDICAL HISTORY:  Past Medical History:  Diagnosis Date   Congenital malformation of intestinal fixation    Gastritis    Gastroesophageal reflux disease without esophagitis 09/10/2021   IDA (iron deficiency anemia)    Mitral valve prolapse    Thrombocytosis     SURGICAL HISTORY: Past Surgical History:  Procedure Laterality Date   SMALL INTESTINE SURGERY  1994    SOCIAL HISTORY: Social History   Socioeconomic History   Marital status: Married    Spouse name:  Not on file   Number of children: 0   Years of education: Not on file   Highest education level: Not on file  Occupational History   Not on file  Tobacco Use   Smoking status: Former    Years: 15.00    Types: Cigarettes   Smokeless tobacco: Never  Vaping Use   Vaping Use: Never used  Substance and Sexual Activity   Alcohol use: Not Currently   Drug use: Never   Sexual activity: Yes    Partners: Male  Other Topics Concern   Not on file  Social History Narrative   Not on file   Social Determinants of Health   Financial Resource Strain: Not on file  Food Insecurity: Not on file  Transportation Needs: Not on file  Physical Activity: Not on file  Stress: Not on file  Social Connections: Not on file  Intimate Partner Violence: Not on file    FAMILY HISTORY: Family History  Problem Relation Age of Onset   Heart disease Mother    Valvular heart disease Mother    Healthy Sister    Alcoholism Half-Sister     ALLERGIES:  is allergic to aspirin and sulfa antibiotics.  MEDICATIONS:  Current Outpatient Medications  Medication Sig Dispense Refill   famotidine (PEPCID) 40 MG tablet Take 1 tablet (40 mg total) by mouth daily. 90 tablet 3   folic acid (FOLVITE) 1 MG tablet TAKE 1 TABLET BY MOUTH EVERY DAY 30 tablet 1   hydroxyurea (HYDREA) 500 MG capsule TAKE  CAPS ALTERNATING WITH 3 CAPS EVERY OTHER DAY W/FOOD TO MINIMIZE GI EFFECTS 60 capsule 1  ° methocarbamol (ROBAXIN) 500 MG tablet Take 1 tablet (500 mg total) by mouth 2 (two) times daily. 20 tablet 0  ° metoprolol tartrate (LOPRESSOR) 25 MG tablet Take 0.5 tablets (12.5 mg total) by mouth 2 (two) times daily. 90 tablet 3  ° sucralfate (CARAFATE) 1 g tablet Take 1 tablet (1 g total) by mouth 4 (four) times daily -  with meals and at bedtime. (Patient taking differently: Take 1 g by mouth 2 (two) times daily.) 360 tablet 3  ° °No current facility-administered medications for this visit.  ° ° °REVIEW OF SYSTEMS:   °Constitutional:  ( - ) fevers, ( - )  chills , ( - ) night sweats °Eyes: ( - ) blurriness of vision, ( - ) double vision, ( - ) watery eyes °Ears, nose, mouth, throat, and face: ( - ) mucositis, ( - ) sore throat °Respiratory: ( - ) cough, ( - ) dyspnea, ( - ) wheezes °Cardiovascular: ( - ) palpitation, ( - ) chest discomfort, ( - ) lower extremity swelling °Gastrointestinal:  ( - ) nausea, ( - ) heartburn, ( - ) change in bowel habits °Skin: ( - ) abnormal skin rashes °Lymphatics: ( - ) new lymphadenopathy, ( - ) easy bruising °Neurological: ( - ) numbness, ( - ) tingling, ( - ) new weaknesses °Behavioral/Psych: ( - ) mood change, ( - ) new changes  °All other systems were reviewed with the patient and are negative. ° °PHYSICAL EXAMINATION: °ECOG PERFORMANCE STATUS: 0 - Asymptomatic ° °Vitals:  ° 10/26/21 1117  °BP: 132/80  °Pulse: 76  °Resp: 16  °Temp: 98 °F (36.7 °C)  °SpO2: 100%  ° °Filed Weights  ° ° °GENERAL: Well-appearing elderly Caucasian female, alert, no distress and comfortable °SKIN: skin color, texture, turgor are normal, no rashes or significant lesions °EYES: conjunctiva are pink and non-injected, sclera clear °LUNGS: clear to auscultation and percussion with normal breathing effort °HEART: regular rate & rhythm and no murmurs and no lower extremity edema °Musculoskeletal: no cyanosis of digits and no clubbing  °PSYCH: alert & oriented x 3, fluent speech °NEURO: no focal motor/sensory deficits ° °LABORATORY DATA:  °I have reviewed the data as listed °CBC Latest Ref Rng & Units 10/26/2021 10/05/2021 07/28/2021  °WBC 4.0 - 10.5 K/uL 6.7 4.3 5.2  °Hemoglobin 12.0 - 15.0 g/dL 13.3 13.2 13.3  °Hematocrit 36.0 - 46.0 % 39.3 38.9 39.1  °Platelets 150 - 400 K/uL 204 522(H) 214  ° ° °CMP Latest Ref Rng & Units 10/26/2021 10/05/2021 07/28/2021  °Glucose 70 - 99 mg/dL 108(H) 117(H) 92  °BUN 8 - 23 mg/dL 14 11 11  °Creatinine 0.44 - 1.00 mg/dL 0.88 0.72 0.82  °Sodium 135 - 145 mmol/L 139 139 140  °Potassium 3.5 - 5.1 mmol/L 3.9 4.2 4.1   °Chloride 98 - 111 mmol/L 104 104 105  °CO2 22 - 32 mmol/L 30 26 25  °Calcium 8.9 - 10.3 mg/dL 10.3 10.4(H) 9.5  °Total Protein 6.5 - 8.1 g/dL 7.6 - 7.0  °Total Bilirubin 0.3 - 1.2 mg/dL 0.5 - 0.6  °Alkaline Phos 38 - 126 U/L 60 - 67  °AST 15 - 41 U/L 24 - 25  °ALT 0 - 44 U/L 21 - 22  ° °RADIOGRAPHIC STUDIES: °DG Chest 2 View ° °Result Date: 10/05/2021 °CLINICAL DATA:  Palpitations for 3 days EXAM: CHEST - 2 VIEW COMPARISON:  None   FINDINGS: Normal heart size, mediastinal contours, and pulmonary vascularity. Post CABG. Atherosclerotic calcification aorta. Minimal subsegmental atelectasis at RIGHT base. Lungs otherwise clear. No pulmonary infiltrate, pleural effusion, or pneumothorax. Bones demineralized. IMPRESSION: Minimal RIGHT basilar atelectasis. Aortic Atherosclerosis (ICD10-I70.0). Electronically Signed   By: Mark  Boles M.D.   On: 10/05/2021 11:32   ° °ASSESSMENT & PLAN °Kristina Osborne 70 y.o. female with medical history significant for essential thrombocytosis who presents for a follow up visit.  ° °After review of the labs, review of the records, and discussion with the patient the patients findings are most consistent with thrombocytosis, reported due to essential thrombocythemia. We have received the records showing that she has a JAK2 mutation.  Additionally she will require a bone marrow biopsy in order to assure that essential thrombocythemia is the diagnosis.  In the interim we will have the patient continue her hydroxyurea as previously prescribed by her provider in [place]. 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  She will require a bone marrow biopsy to confirm this diagnosis °--JAK2 testing received from patient's prior hematologist °-- If and when the patient is agreeable we will schedule her for a bone marrow biopsy. °--Continue with currently scheduled 1500 mg  hydroxyurea alternating with 1000 mg every other day °-- Labs today show white blood cell count 6.7, hemoglobin 13.3, MCV 121.7, and platelets of 204 °--Return to clinic in 3 months time in order to continue monitoring her CBC.   °  °No orders of the defined types were placed in this encounter. ° ° °All questions were answered. The patient knows to call the clinic with any problems, questions or concerns. ° °A total of more than 30 minutes were spent on this encounter with face-to-face time and non-face-to-face time, including preparing to see the patient, ordering tests and/or medications, counseling the patient and coordination of care as outlined above.  ° °John T. Dorsey, MD °Department of Hematology/Oncology °[facility] °Phone: [phone] °Pager: [phone] °Email: [email or web address] ° °10/26/2021 12:18 PM ° °

## 2021-10-27 ENCOUNTER — Other Ambulatory Visit: Payer: Self-pay | Admitting: Hematology and Oncology

## 2021-10-31 ENCOUNTER — Other Ambulatory Visit: Payer: Self-pay | Admitting: Internal Medicine

## 2021-10-31 ENCOUNTER — Telehealth: Payer: Self-pay | Admitting: Internal Medicine

## 2021-10-31 NOTE — Telephone Encounter (Signed)
Patient calling in ? ?Patient wanting to know if provider can submit refill for methocarbamol (ROBAXIN) 500 MG tablet which was originally prescribed by an urgent care provider ? ?Patient says she is scheduled to see an ortho specialist but needs med to hold her until appt ? ?Pharmacy ? ?[id number] [place], [place] - [address] ?Phone:  [phone]?Fax:  [phone]? ? ? ?

## 2021-11-01 NOTE — Telephone Encounter (Signed)
Called pt, LVM to discuss. ? ?Need clarification on Dx ?

## 2021-11-02 ENCOUNTER — Other Ambulatory Visit: Payer: Self-pay

## 2021-11-02 ENCOUNTER — Ambulatory Visit (INDEPENDENT_AMBULATORY_CARE_PROVIDER_SITE_OTHER): Payer: No Typology Code available for payment source | Admitting: Internal Medicine

## 2021-11-02 DIAGNOSIS — M5412 Radiculopathy, cervical region: Secondary | ICD-10-CM | POA: Diagnosis not present

## 2021-11-02 DIAGNOSIS — M25521 Pain in right elbow: Secondary | ICD-10-CM

## 2021-11-02 DIAGNOSIS — R002 Palpitations: Secondary | ICD-10-CM

## 2021-11-02 MED ORDER — PREDNISONE 10 MG PO TABS: ORAL_TABLET | ORAL | 0 refills | Status: DC

## 2021-11-02 MED ORDER — METHOCARBAMOL 500 MG PO TABS: 500.0000 mg | ORAL_TABLET | Freq: Four times a day (QID) | ORAL | 1 refills | Status: DC | PRN

## 2021-11-02 MED ORDER — TRAMADOL HCL 50 MG PO TABS: 50.0000 mg | ORAL_TABLET | Freq: Four times a day (QID) | ORAL | 0 refills | Status: DC | PRN

## 2021-11-02 NOTE — Patient Instructions (Signed)
Please take all new medication as prescribed - the tramadol for pain, muscle relaxer and prednisone ? ?Please continue all other medications as before, and refills have been done if requested. ? ?Please have the pharmacy call with any other refills you may need. ? ?Please keep your appointments with your specialists as you may have planned ? ?Please return in 10 days if not improved ? ? ? ?

## 2021-11-06 ENCOUNTER — Encounter: Payer: Self-pay | Admitting: Internal Medicine

## 2021-11-06 DIAGNOSIS — R002 Palpitations: Secondary | ICD-10-CM | POA: Insufficient documentation

## 2021-11-06 DIAGNOSIS — M25521 Pain in right elbow: Secondary | ICD-10-CM | POA: Insufficient documentation

## 2021-11-06 NOTE — Assessment & Plan Note (Signed)
Improved possible resolved with addition lopressor, most recent ECg feb 2023 with PACs, pt declines further eval such as card event monitor or TSH for now ?

## 2021-11-06 NOTE — Assessment & Plan Note (Signed)
To right neck and RUE mild to mod - for tramadol qhs, robaxin prn, and prednisone trial,  to f/u any worsening symptoms or concerns, consdier MRI cervical for any worsening ?

## 2021-11-06 NOTE — Assessment & Plan Note (Signed)
Right elbow and distal probable msk vs neuritic - very mild, I suspect separate from right neck cervical radiculitis issue, exam benign, ok to follow for now ?

## 2021-11-06 NOTE — Progress Notes (Signed)
Patient ID: Kristina Osborne, female   DOB: October 08, 1950, 71 y.o.   MRN: [id number] ? ? ?    Chief Complaint: follow up right neck, shoulder and arm pain ? ?     HPI:  Kristina Osborne is a 71 y.o. female here with 3 wk onset mild to mod right post lateral neck pain, burning and sometimes sharp with pain to the right upper back near the shoulder blade, right shoulder and right upper arm to near the elbow;  also has some mention of pain to the arm below the elbow at times but seems differrent and not at the same time, seems to be mostly at the right lateral epicondylar area or elbow. No worsening UE weakness or loss of gripl   Also seen in ED with PVCs recently now some improved, tx with lopressor, also robaxin per UC overall better today.  She seems to tolerate pain ok during the day, but just cant get to sleep, asking for pain med qhs.   Pt denies polydipsia, polyuria, or new focal neuro s/s.   Pt denies fever, wt loss, night sweats, loss of appetite, or other constitutional symptoms   ?      ?Wt Readings from Last 3 Encounters:  ?11/02/21 160 lb (72.6 kg)  ?10/07/21 160 lb (72.6 kg)  ?10/05/21 160 lb (72.6 kg)  ? ?BP Readings from Last 3 Encounters:  ?11/02/21 108/60  ?10/26/21 132/80  ?10/07/21 102/64  ? ?      ?Past Medical History:  ?Diagnosis Date  ? Congenital malformation of intestinal fixation   ? Gastritis   ? Gastroesophageal reflux disease without esophagitis 09/10/2021  ? IDA (iron deficiency anemia)   ? Mitral valve prolapse   ? Thrombocytosis   ? ?Past Surgical History:  ?Procedure Laterality Date  ? SMALL INTESTINE SURGERY  1994  ? ? reports that she has quit smoking. Her smoking use included cigarettes. She has never used smokeless tobacco. She reports that she does not currently use alcohol. She reports that she does not use drugs. ?family history includes Alcoholism in her half-sister; Healthy in her sister; Heart disease in her mother; Valvular heart disease in her mother. ?Allergies  ?Allergen  Reactions  ? Aspirin Other (See Comments)  ?  "Due to intestinal issue"  ? Sulfa Antibiotics   ? ?Current Outpatient Medications on File Prior to Visit  ?Medication Sig Dispense Refill  ? famotidine (PEPCID) 40 MG tablet Take 1 tablet (40 mg total) by mouth daily. 90 tablet 3  ? folic acid (FOLVITE) 1 MG tablet TAKE 1 TABLET BY MOUTH EVERY DAY 30 tablet 1  ? hydroxyurea (HYDREA) 500 MG capsule TAKE 2 CAPS ALTERNATING WITH 3 CAPS EVERY OTHER DAY W/FOOD TO MINIMIZE GI EFFECTS 60 capsule 1  ? metoprolol tartrate (LOPRESSOR) 25 MG tablet Take 0.5 tablets (12.5 mg total) by mouth 2 (two) times daily. 90 tablet 3  ? sucralfate (CARAFATE) 1 g tablet Take 1 tablet (1 g total) by mouth 4 (four) times daily -  with meals and at bedtime. (Patient taking differently: Take 1 g by mouth 2 (two) times daily.) 360 tablet 3  ? ?No current facility-administered medications on file prior to visit.  ? ?     ROS:  All others reviewed and negative. ? ?Objective  ? ?     PE:  BP 108/60   Pulse 72   Temp 98 ?F (36.7 ?C) (Oral)   Ht '5\' 6"'$  (1.676 m)   Wt 160 lb (72.6  kg)   LMP  (LMP Unknown)   SpO2 98%   BMI 25.82 kg/m?  ? ?              Constitutional: Pt appears in NAD ?              HENT: Head: NCAT.  ?              Right Ear: External ear normal.   ?              Left Ear: External ear normal.  ?              Eyes: . Pupils are equal, round, and reactive to light. Conjunctivae and EOM are normal ?              Nose: without d/c or deformity ?              Neck: Neck supple. Gross normal ROM ?              Cardiovascular: Normal rate and regular rhythm.   ?              Pulmonary/Chest: Effort normal and breath sounds without rales or wheezing.  ?              Abd:  Soft, NT, ND, + BS, no organomegaly ?              Neurological: Pt is alert. At baseline orientation, motor grossly intact, sens intact to LT ?              Skin: Skin is warm. No rashes, no other new lesions, LE edema - none ?              Psychiatric: Pt behavior is  normal without agitation  ? ?Micro: none ? ?Cardiac tracings I have personally interpreted today:  none ? ?Pertinent Radiological findings (summarize): none  ? ?Lab Results  ?Component Value Date  ? WBC 6.7 10/26/2021  ? HGB 13.3 10/26/2021  ? HCT 39.3 10/26/2021  ? PLT 204 10/26/2021  ? GLUCOSE 108 (H) 10/26/2021  ? ALT 21 10/26/2021  ? AST 24 10/26/2021  ? NA 139 10/26/2021  ? K 3.9 10/26/2021  ? CL 104 10/26/2021  ? CREATININE 0.88 10/26/2021  ? BUN 14 10/26/2021  ? CO2 30 10/26/2021  ? ?Assessment/Plan:  ?Kristina Osborne is a 71 y.o. Unavailable [8] female with  has a past medical history of Congenital malformation of intestinal fixation, Gastritis, Gastroesophageal reflux disease without esophagitis (09/10/2021), IDA (iron deficiency anemia), Mitral valve prolapse, and Thrombocytosis. ? ?Cervical radiculitis ?To right neck and RUE mild to mod - for tramadol qhs, robaxin prn, and prednisone trial,  to f/u any worsening symptoms or concerns, consdier MRI cervical for any worsening ? ?Right elbow pain ?Right elbow and distal probable msk vs neuritic - very mild, I suspect separate from right neck cervical radiculitis issue, exam benign, ok to follow for now ? ?Palpitations ?Improved possible resolved with addition lopressor, most recent ECg feb 2023 with PACs, pt declines further eval such as card event monitor or TSH for now ? ?Followup: Return if symptoms worsen or fail to improve. ? ?Cathlean Cower, MD 11/06/2021 12:36 PM ?[place] ?[place] ?Internal Medicine ?

## 2021-11-14 NOTE — Progress Notes (Deleted)
? ?   Kristina Osborne D.Merril Abbe ?[facility] ?[place] ?Phone: [phone] ?  ?Assessment and Plan:   ?  ?There are no diagnoses linked to this encounter.  ?*** ?  ?Pertinent previous records reviewed include *** ?  ?Follow Up: ***  ? ?  ?Subjective:   ?I, Pincus Badder, am serving as a Education administrator for [employer] ? ?Chief Complaint: back pain  ? ?HPI:  ?11/15/2021 ?Patient is 71 year old female complaining of back pain. Patient states ? ?Relevant Historical Information: *** ? ?Additional pertinent review of systems negative. ? ? ?Current Outpatient Medications:  ?  famotidine (PEPCID) 40 MG tablet, Take 1 tablet (40 mg total) by mouth daily., Disp: 90 tablet, Rfl: 3 ?  folic acid (FOLVITE) 1 MG tablet, TAKE 1 TABLET BY MOUTH EVERY DAY, Disp: 30 tablet, Rfl: 1 ?  hydroxyurea (HYDREA) 500 MG capsule, TAKE 2 CAPS ALTERNATING WITH 3 CAPS EVERY OTHER DAY W/FOOD TO MINIMIZE GI EFFECTS, Disp: 60 capsule, Rfl: 1 ?  methocarbamol (ROBAXIN) 500 MG tablet, Take 1 tablet (500 mg total) by mouth every 6 (six) hours as needed for muscle spasms., Disp: 40 tablet, Rfl: 1 ?  metoprolol tartrate (LOPRESSOR) 25 MG tablet, Take 0.5 tablets (12.5 mg total) by mouth 2 (two) times daily., Disp: 90 tablet, Rfl: 3 ?  predniSONE (DELTASONE) 10 MG tablet, 3 tabs by mouth per day for 3 days,2tabs per day for 3 days,1tab per day for 3 days, Disp: 18 tablet, Rfl: 0 ?  sucralfate (CARAFATE) 1 g tablet, Take 1 tablet (1 g total) by mouth 4 (four) times daily -  with meals and at bedtime. (Patient taking differently: Take 1 g by mouth 2 (two) times daily.), Disp: 360 tablet, Rfl: 3 ?  traMADol (ULTRAM) 50 MG tablet, Take 1 tablet (50 mg total) by mouth every 6 (six) hours as needed., Disp: 30 tablet, Rfl: 0  ? ?Objective:   ?  ?There were no vitals filed for this visit.  ?  ?There is no height or weight on file to calculate BMI.  ?  ?Physical Exam:   ? ?*** ? ? ?Electronically signed by:   ?Kristina Osborne D.Merril Abbe ?[facility] ?4:00 PM 11/14/21 ?

## 2021-11-15 ENCOUNTER — Ambulatory Visit: Payer: No Typology Code available for payment source | Admitting: Sports Medicine

## 2021-11-15 ENCOUNTER — Other Ambulatory Visit: Payer: Self-pay

## 2021-12-02 ENCOUNTER — Other Ambulatory Visit: Payer: Self-pay | Admitting: Hematology and Oncology

## 2021-12-14 ENCOUNTER — Telehealth: Payer: Self-pay

## 2021-12-14 NOTE — Telephone Encounter (Signed)
Ok with me 

## 2021-12-14 NOTE — Telephone Encounter (Signed)
Pt is requesting a TOC from Dr. Ronnald Ramp to Dr. Jenny Reichmann. Pt states that she saw Dr. Jenny Reichmann last for an issue and really wanted to switch.  ? ?I advised the pt upon approval I would call her back to get her scheduled. ? ?Please advise ?

## 2021-12-15 ENCOUNTER — Encounter: Payer: Self-pay | Admitting: Podiatry

## 2021-12-15 ENCOUNTER — Ambulatory Visit (INDEPENDENT_AMBULATORY_CARE_PROVIDER_SITE_OTHER): Payer: No Typology Code available for payment source | Admitting: Podiatry

## 2021-12-15 DIAGNOSIS — B351 Tinea unguium: Secondary | ICD-10-CM

## 2021-12-15 DIAGNOSIS — L600 Ingrowing nail: Secondary | ICD-10-CM | POA: Diagnosis not present

## 2021-12-16 NOTE — Progress Notes (Signed)
Subjective:  ? ?Patient ID: Kristina Osborne, female   DOB: 71 y.o.   MRN: [id number]  ? ?HPI ?Patient presents concerned about nail discoloration of the big toes both feet stating it is in the same area and she does not remember injury.  States that they get sore very infrequently and it is more the thickness and lock.  Patient does not smoke tries to be active ? ? ?Review of Systems  ?All other systems reviewed and are negative. ? ? ?   ?Objective:  ?Physical Exam ?Vitals and nursing note reviewed.  ?Constitutional:   ?   Appearance: She is well-developed.  ?Pulmonary:  ?   Effort: Pulmonary effort is normal.  ?Musculoskeletal:     ?   General: Normal range of motion.  ?Skin: ?   General: Skin is warm.  ?Neurological:  ?   Mental Status: She is alert.  ?  ?Neurovascular status was found to be intact with digital flow good and neurological within normal limits.  Patient is noted to have discoloration of the hallux nailbeds on the lateral side bilateral with slight thickness but minimal discomfort when I pressed into the area no redness no edema erythema.  Good digital perfusion well oriented x3 ? ?   ?Assessment:  ?Nail disease which appears to be more traumatic versus fungal or related to ingrown toenail ? ?   ?Plan:  ?H&P done educated her on this do not consider this to be something that needs to be addressed but I did discuss good care for her feet and if they become painful may require removal ?   ? ? ?

## 2021-12-20 ENCOUNTER — Ambulatory Visit: Payer: No Typology Code available for payment source | Admitting: Internal Medicine

## 2021-12-23 ENCOUNTER — Telehealth: Payer: Self-pay | Admitting: Internal Medicine

## 2021-12-23 NOTE — Telephone Encounter (Signed)
Patient notified

## 2021-12-23 NOTE — Telephone Encounter (Signed)
Pt called in and states a parrot bit her finger.  ? ?She wants to know if she should take something just in case it has a disease.  ? ?Please callback ASAP with instruction.  ?

## 2021-12-23 NOTE — Telephone Encounter (Signed)
Only to watch for signs or symptoms of infection for now, and consider nurse visit for tdap (or pharmacy if not covered by insurance here) ?

## 2021-12-24 ENCOUNTER — Other Ambulatory Visit: Payer: Self-pay | Admitting: Hematology and Oncology

## 2021-12-27 ENCOUNTER — Ambulatory Visit: Payer: No Typology Code available for payment source | Admitting: Internal Medicine

## 2022-01-06 ENCOUNTER — Ambulatory Visit: Payer: No Typology Code available for payment source | Admitting: Internal Medicine

## 2022-01-17 ENCOUNTER — Telehealth: Payer: Self-pay | Admitting: *Deleted

## 2022-01-17 NOTE — Telephone Encounter (Signed)
Opened in error

## 2022-01-17 NOTE — Telephone Encounter (Signed)
Patient called.States that with current diagnosis she has concern of increased risk of blood clots when flying. She said she doesn't know if it is any greater risk for her than anyone else since she wears compression socks and her lab results are mostly normal.  Dr. Lorenso Courier informed of patient's concern/questions. Per Dr. Lorenso Courier, safe for patient to fly. Stay well hydrated, wear compression socks and move feet up and down while seated.   Patient verbalized understanding of all information.

## 2022-01-18 ENCOUNTER — Other Ambulatory Visit: Payer: Self-pay | Admitting: Hematology and Oncology

## 2022-01-20 ENCOUNTER — Ambulatory Visit (INDEPENDENT_AMBULATORY_CARE_PROVIDER_SITE_OTHER): Payer: No Typology Code available for payment source | Admitting: Internal Medicine

## 2022-01-20 VITALS — BP 110/64 | HR 57 | Temp 97.7°F | Ht 66.0 in

## 2022-01-20 DIAGNOSIS — M5412 Radiculopathy, cervical region: Secondary | ICD-10-CM | POA: Diagnosis not present

## 2022-01-20 DIAGNOSIS — E7800 Pure hypercholesterolemia, unspecified: Secondary | ICD-10-CM

## 2022-01-20 DIAGNOSIS — R739 Hyperglycemia, unspecified: Secondary | ICD-10-CM | POA: Diagnosis not present

## 2022-01-20 DIAGNOSIS — E538 Deficiency of other specified B group vitamins: Secondary | ICD-10-CM

## 2022-01-20 DIAGNOSIS — E559 Vitamin D deficiency, unspecified: Secondary | ICD-10-CM

## 2022-01-20 MED ORDER — PREDNISONE 10 MG PO TABS: ORAL_TABLET | ORAL | 0 refills | Status: DC

## 2022-01-20 MED ORDER — METHOCARBAMOL 500 MG PO TABS: 500.0000 mg | ORAL_TABLET | Freq: Four times a day (QID) | ORAL | 1 refills | Status: DC | PRN

## 2022-01-20 MED ORDER — TRAMADOL HCL 50 MG PO TABS: 50.0000 mg | ORAL_TABLET | Freq: Four times a day (QID) | ORAL | 0 refills | Status: DC | PRN

## 2022-01-20 NOTE — Progress Notes (Unsigned)
Patient ID: Kristina Osborne, female   DOB: 21-Jul-1951, 71 y.o.   MRN: [id number]         Chief Complaint:: wellness exam and No chief complaint on file.  ***       HPI:  Kristina Osborne is a 71 y.o. female here for wellness exam                        Also***   Wt Readings from Last 3 Encounters:  11/02/21 160 lb (72.6 kg)  10/07/21 160 lb (72.6 kg)  10/05/21 160 lb (72.6 kg)   BP Readings from Last 3 Encounters:  01/20/22 110/64  11/02/21 108/60  10/26/21 132/80   Immunization History  Administered Date(s) Administered   Influenza-Unspecified 06/28/2021   PNEUMOCOCCAL CONJUGATE-20 09/07/2021   Tdap 03/03/2020   Health Maintenance Due  Topic Date Due   COVID-19 Vaccine (1) Never done   Hepatitis C Screening  Never done   Zoster Vaccines- Shingrix (1 of 2) Never done   MAMMOGRAM  Never done   DEXA SCAN  Never done      Past Medical History:  Diagnosis Date   Congenital malformation of intestinal fixation    Gastritis    Gastroesophageal reflux disease without esophagitis 09/10/2021   IDA (iron deficiency anemia)    Mitral valve prolapse    Thrombocytosis    Past Surgical History:  Procedure Laterality Date   [place]    reports that she has quit smoking. Her smoking use included cigarettes. She has never used smokeless tobacco. She reports that she does not currently use alcohol. She reports that she does not use drugs. family history includes Alcoholism in her half-sister; Healthy in her sister; Heart disease in her mother; Valvular heart disease in her mother. Allergies  Allergen Reactions   Aspirin Other (See Comments)    "Due to intestinal issue"   Sulfa Antibiotics    Current Outpatient Medications on File Prior to Visit  Medication Sig Dispense Refill   famotidine (PEPCID) 40 MG tablet Take 1 tablet (40 mg total) by mouth daily. 90 tablet 3   folic acid (FOLVITE) 1 MG tablet TAKE 1 TABLET BY MOUTH EVERY DAY 30 tablet 1    hydroxyurea (HYDREA) 500 MG capsule TAKE 2 CAPS ALTERNATING WITH 3 CAPS EVERY OTHER DAY W/FOOD TO MINIMIZE GI EFFECTS 60 capsule 1   methocarbamol (ROBAXIN) 500 MG tablet Take 1 tablet (500 mg total) by mouth every 6 (six) hours as needed for muscle spasms. 40 tablet 1   predniSONE (DELTASONE) 10 MG tablet 3 tabs by mouth per day for 3 days,2tabs per day for 3 days,1tab per day for 3 days 18 tablet 0   traMADol (ULTRAM) 50 MG tablet Take 1 tablet (50 mg total) by mouth every 6 (six) hours as needed. 30 tablet 0   metoprolol tartrate (LOPRESSOR) 25 MG tablet Take 0.5 tablets (12.5 mg total) by mouth 2 (two) times daily. 90 tablet 3   sucralfate (CARAFATE) 1 g tablet Take 1 tablet (1 g total) by mouth 4 (four) times daily -  with meals and at bedtime. (Patient taking differently: Take 1 g by mouth 2 (two) times daily.) 360 tablet 3   No current facility-administered medications on file prior to visit.        ROS:  All others reviewed and negative.  Objective        PE:  BP 110/64 (BP Location: Right Arm, Patient Position:  Sitting, Cuff Size: Large)   Pulse (!) 57   Temp 97.7 F (36.5 C) (Oral)   Ht '5\' 6"'$  (1.676 m)   LMP  (LMP Unknown)   SpO2 98%   BMI 25.82 kg/m                 Constitutional: Pt appears in NAD               HENT: Head: NCAT.                Right Ear: External ear normal.                 Left Ear: External ear normal.                Eyes: . Pupils are equal, round, and reactive to light. Conjunctivae and EOM are normal               Nose: without d/c or deformity               Neck: Neck supple. Gross normal ROM               Cardiovascular: Normal rate and regular rhythm.                 Pulmonary/Chest: Effort normal and breath sounds without rales or wheezing.                Abd:  Soft, NT, ND, + BS, no organomegaly               Neurological: Pt is alert. At baseline orientation, motor grossly intact               Skin: Skin is warm. No rashes, no other new  lesions, LE edema - ***               Psychiatric: Pt behavior is normal without agitation   Micro: none  Cardiac tracings I have personally interpreted today:  none  Pertinent Radiological findings (summarize): none   Lab Results  Component Value Date   WBC 6.7 10/26/2021   HGB 13.3 10/26/2021   HCT 39.3 10/26/2021   PLT 204 10/26/2021   GLUCOSE 108 (H) 10/26/2021   ALT 21 10/26/2021   AST 24 10/26/2021   NA 139 10/26/2021   K 3.9 10/26/2021   CL 104 10/26/2021   CREATININE 0.88 10/26/2021   BUN 14 10/26/2021   CO2 30 10/26/2021   Assessment/Plan:  [name] is a 71 y.o. Unavailable [8] female with  has a past medical history of Congenital malformation of intestinal fixation, Gastritis, Gastroesophageal reflux disease without esophagitis (09/10/2021), IDA (iron deficiency anemia), Mitral valve prolapse, and Thrombocytosis.  No problem-specific Assessment & Plan notes found for this encounter.  Followup: No follow-ups on file.  Kristina Cower, MD 01/20/2022 11:13 AM [facility]

## 2022-01-20 NOTE — Patient Instructions (Signed)
Please take all new medication as prescribed - the pain medication, prednisone, and muscle relaxer as needed  Please continue all other medications as before, and refills have been done if requested.  Please have the pharmacy call with any other refills you may need.  Please continue your efforts at being more active, low cholesterol diet, and weight control.  You are otherwise up to date with prevention measures today.  Please keep your appointments with your specialists as you may have planned  Please go to the LAB at the blood drawing area for the tests to be done  You will be contacted by phone if any changes need to be made immediately.  Otherwise, you will receive a letter about your results with an explanation, but please check with MyChart first.  Please remember to sign up for MyChart if you have not done so, as this will be important to you in the future with finding out test results, communicating by private email, and scheduling acute appointments online when needed.  Please make an Appointment to return in 6 months, or sooner if needed

## 2022-01-22 ENCOUNTER — Encounter: Payer: Self-pay | Admitting: Internal Medicine

## 2022-01-22 NOTE — Assessment & Plan Note (Signed)
With recent right sided symptoms severe now improved, now with left sided somewhat similar symptoms, for repeat tx as before with pain control, prednisone, muscle relaxer.  Consider further PT if not improving or MRI c spine and /or sport medicine.

## 2022-01-26 ENCOUNTER — Inpatient Hospital Stay: Payer: No Typology Code available for payment source | Admitting: Hematology and Oncology

## 2022-01-26 ENCOUNTER — Inpatient Hospital Stay: Payer: No Typology Code available for payment source

## 2022-02-11 ENCOUNTER — Other Ambulatory Visit: Payer: Self-pay | Admitting: Hematology and Oncology

## 2022-02-21 ENCOUNTER — Other Ambulatory Visit: Payer: Self-pay | Admitting: Hematology and Oncology

## 2022-02-27 ENCOUNTER — Other Ambulatory Visit: Payer: No Typology Code available for payment source

## 2022-02-27 ENCOUNTER — Ambulatory Visit: Payer: No Typology Code available for payment source | Admitting: Hematology and Oncology

## 2022-03-10 ENCOUNTER — Inpatient Hospital Stay: Payer: No Typology Code available for payment source | Attending: Hematology and Oncology

## 2022-03-10 ENCOUNTER — Other Ambulatory Visit: Payer: Self-pay | Admitting: Hematology and Oncology

## 2022-03-10 ENCOUNTER — Other Ambulatory Visit: Payer: Self-pay

## 2022-03-10 ENCOUNTER — Inpatient Hospital Stay (HOSPITAL_BASED_OUTPATIENT_CLINIC_OR_DEPARTMENT_OTHER): Payer: No Typology Code available for payment source | Admitting: Hematology and Oncology

## 2022-03-10 VITALS — BP 108/63 | HR 67 | Temp 97.5°F | Resp 15

## 2022-03-10 DIAGNOSIS — D509 Iron deficiency anemia, unspecified: Secondary | ICD-10-CM | POA: Diagnosis not present

## 2022-03-10 DIAGNOSIS — D473 Essential (hemorrhagic) thrombocythemia: Secondary | ICD-10-CM

## 2022-03-10 DIAGNOSIS — D75839 Thrombocytosis, unspecified: Secondary | ICD-10-CM | POA: Insufficient documentation

## 2022-03-10 DIAGNOSIS — Z79899 Other long term (current) drug therapy: Secondary | ICD-10-CM | POA: Insufficient documentation

## 2022-03-10 LAB — CBC WITH DIFFERENTIAL (CANCER CENTER ONLY)
Abs Immature Granulocytes: 0.01 10*3/uL (ref 0.00–0.07)
Basophils Absolute: 0 10*3/uL (ref 0.0–0.1)
Basophils Relative: 1 %
Eosinophils Absolute: 0 10*3/uL (ref 0.0–0.5)
Eosinophils Relative: 0 %
HCT: 33.6 % — ABNORMAL LOW (ref 36.0–46.0)
Hemoglobin: 11.2 g/dL — ABNORMAL LOW (ref 12.0–15.0)
Immature Granulocytes: 0 %
Lymphocytes Relative: 15 %
Lymphs Abs: 0.6 10*3/uL — ABNORMAL LOW (ref 0.7–4.0)
MCH: 40 pg — ABNORMAL HIGH (ref 26.0–34.0)
MCHC: 33.3 g/dL (ref 30.0–36.0)
MCV: 120 fL — ABNORMAL HIGH (ref 80.0–100.0)
Monocytes Absolute: 0.3 10*3/uL (ref 0.1–1.0)
Monocytes Relative: 8 %
Neutro Abs: 2.8 10*3/uL (ref 1.7–7.7)
Neutrophils Relative %: 76 %
Platelet Count: 261 10*3/uL (ref 150–400)
RBC: 2.8 MIL/uL — ABNORMAL LOW (ref 3.87–5.11)
RDW: 13 % (ref 11.5–15.5)
WBC Count: 3.7 10*3/uL — ABNORMAL LOW (ref 4.0–10.5)
nRBC: 0 % (ref 0.0–0.2)

## 2022-03-10 LAB — CMP (CANCER CENTER ONLY)
ALT: 17 U/L (ref 0–44)
AST: 22 U/L (ref 15–41)
Albumin: 4.5 g/dL (ref 3.5–5.0)
Alkaline Phosphatase: 62 U/L (ref 38–126)
Anion gap: 5 (ref 5–15)
BUN: 11 mg/dL (ref 8–23)
CO2: 30 mmol/L (ref 22–32)
Calcium: 9.9 mg/dL (ref 8.9–10.3)
Chloride: 103 mmol/L (ref 98–111)
Creatinine: 0.86 mg/dL (ref 0.44–1.00)
GFR, Estimated: >60 mL/min (ref >60)
Glucose, Bld: 100 mg/dL — ABNORMAL HIGH (ref 70–99)
Potassium: 4.3 mmol/L (ref 3.5–5.1)
Sodium: 138 mmol/L (ref 135–145)
Total Bilirubin: 0.4 mg/dL (ref 0.3–1.2)
Total Protein: 7.1 g/dL (ref 6.5–8.1)

## 2022-03-13 NOTE — Progress Notes (Signed)
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 Lorenso Courier  10/26/2021: WBC 6.7, Hgb 13.3, MCV 121.7, Plt 204 03/10/2022: WBC 3.7, Hgb 11.2, MCV 120, Plt 261. Decrease hydroxyurea to 1000 mg BID due to cytopenias.   Interval History:  Kristina Osborne 71 y.o. female with medical history significant for essential thrombocytosis who presents for a follow up visit. The patient's last visit was on 07/28/2021. In the interim since the last visit Ms. Trieu has declined to have a bone marrow biopsy performed, though we have received the records confirming her JAK2 mutation.  On exam today Kristina Osborne is accompanied by her husband.  She reports she "feels great".  She recently got a new dog.  Her energy levels are quite good.  She notes her energy is currently a 9 out of 10.  She is been walking outside quite a bit and has had some trouble keeping up with hydration.  She notes the medication is well-tolerated this time with no stomach upset, gastritis, or ulcers in the mouth or ankles.  Overall she is concerned about the cytopenias and would like to change her dosage of hydroxyurea today.  Details of that change are noted below.  She also denies any signs or symptoms concerning for VTE including shortness of breath, leg swelling, or chest pain.  A full 10 point ROS is listed below.   MEDICAL HISTORY:  Past Medical History:  Diagnosis Date   Congenital malformation of intestinal fixation    Gastritis    Gastroesophageal reflux disease without esophagitis 09/10/2021   IDA (iron deficiency  anemia)    Mitral valve prolapse    Thrombocytosis     SURGICAL HISTORY: Past Surgical History:  Procedure Laterality Date   SMALL INTESTINE SURGERY  1994    SOCIAL HISTORY: Social History   Socioeconomic History   Marital status: Married    Spouse name: Not on file   Number of children: 0   Years of education: Not on file   Highest education level: Not on file  Occupational History   Not on file  Tobacco Use   Smoking status: Former    Years: 15.00    Types: Cigarettes   Smokeless tobacco: Never  Vaping Use   Vaping Use: Never used  Substance and Sexual Activity   Alcohol use: Not Currently   Drug use: Never   Sexual activity: Yes    Partners: Male  Other Topics Concern   Not on file  Social History Narrative   Not on file   Social Determinants of Health   Financial Resource Strain: Not on file  Food Insecurity: Not on file  Transportation Needs: Not on file  Physical Activity: Not on file  Stress: Not on file  Social Connections: Not on file  Intimate Partner Violence: Not on file    FAMILY HISTORY: Family History  Problem Relation Age of Onset   Heart disease Mother    Valvular heart disease Mother    Healthy Sister    Alcoholism Half-Sister     ALLERGIES:  is allergic to aspirin and sulfa antibiotics.  MEDICATIONS:  Current  Outpatient Medications  Medication Sig Dispense Refill   famotidine (PEPCID) 40 MG tablet Take 1 tablet (40 mg total) by mouth daily. 90 tablet 3   folic acid (FOLVITE) 1 MG tablet TAKE 1 TABLET BY MOUTH EVERY DAY 30 tablet 1   hydroxyurea (HYDREA) 500 MG capsule TAKE 2 CAPS ALTERNATING WITH 3 CAPS EVERY OTHER DAY W/FOOD TO MINIMIZE GI EFFECTS 60 capsule 1   methocarbamol (ROBAXIN) 500 MG tablet Take 1 tablet (500 mg total) by mouth every 6 (six) hours as needed for muscle spasms. 40 tablet 1   metoprolol tartrate (LOPRESSOR) 25 MG tablet Take 0.5 tablets (12.5 mg total) by mouth 2 (two) times daily. 90 tablet 3   predniSONE  (DELTASONE) 10 MG tablet 3 tabs by mouth per day for 3 days,2tabs per day for 3 days,1tab per day for 3 days 18 tablet 0   sucralfate (CARAFATE) 1 g tablet Take 1 tablet (1 g total) by mouth 4 (four) times daily -  with meals and at bedtime. (Patient taking differently: Take 1 g by mouth 2 (two) times daily.) 360 tablet 3   traMADol (ULTRAM) 50 MG tablet Take 1 tablet (50 mg total) by mouth every 6 (six) hours as needed. 30 tablet 0   No current facility-administered medications for this visit.    REVIEW OF SYSTEMS:   Constitutional: ( - ) fevers, ( - )  chills , ( - ) night sweats Eyes: ( - ) blurriness of vision, ( - ) double vision, ( - ) watery eyes Ears, nose, mouth, throat, and face: ( - ) mucositis, ( - ) sore throat Respiratory: ( - ) cough, ( - ) dyspnea, ( - ) wheezes Cardiovascular: ( - ) palpitation, ( - ) chest discomfort, ( - ) lower extremity swelling Gastrointestinal:  ( - ) nausea, ( - ) heartburn, ( - ) change in bowel habits Skin: ( - ) abnormal skin rashes Lymphatics: ( - ) new lymphadenopathy, ( - ) easy bruising Neurological: ( - ) numbness, ( - ) tingling, ( - ) new weaknesses Behavioral/Psych: ( - ) mood change, ( - ) new changes  All other systems were reviewed with the patient and are negative.  PHYSICAL EXAMINATION: ECOG PERFORMANCE STATUS: 0 - Asymptomatic  Vitals:   03/10/22 1001  BP: 108/63  Pulse: 67  Resp: 15  Temp: (!) 97.5 F (36.4 C)  SpO2: 100%   Filed Weights    GENERAL: Well-appearing elderly Caucasian female, alert, no distress and comfortable SKIN: skin color, texture, turgor are normal, no rashes or significant lesions EYES: conjunctiva are pink and non-injected, sclera clear LUNGS: clear to auscultation and percussion with normal breathing effort HEART: regular rate & rhythm and no murmurs and no lower extremity edema Musculoskeletal: no cyanosis of digits and no clubbing  PSYCH: alert & oriented x 3, fluent speech NEURO: no focal  motor/sensory deficits  LABORATORY DATA:  I have reviewed the data as listed    Latest Ref Rng & Units 03/10/2022    9:40 AM 10/26/2021   10:53 AM 10/05/2021   11:13 AM  CBC  WBC 4.0 - 10.5 K/uL 3.7  6.7  4.3   Hemoglobin 12.0 - 15.0 g/dL 11.2  13.3  13.2   Hematocrit 36.0 - 46.0 % 33.6  39.3  38.9   Platelets 150 - 400 K/uL 261  204  522        Latest Ref Rng & Units 03/10/2022    9:40 AM 10/26/2021  10:53 AM 10/05/2021   11:13 AM  CMP  Glucose 70 - 99 mg/dL 100  108  117   BUN 8 - 23 mg/dL _0 Creatinine 0.44 - 1.00 mg/dL 0.86  0.88  0.72   Sodium 135 - 145 mmol/L 138  139  139   Potassium 3.5 - 5.1 mmol/L 4.3  3.9  4.2   Chloride 98 - 111 mmol/L 103  104  104   CO2 22 - 32 mmol/L _1 Calcium 8.9 - 10.3 mg/dL 9.9  10.3  10.4   Total Protein 6.5 - 8.1 g/dL 7.1  7.6    Total Bilirubin 0.3 - 1.2 mg/dL 0.4  0.5    Alkaline Phos 38 - 126 U/L 62  60    AST 15 - 41 U/L 22  24    ALT 0 - 44 U/L 17  21     RADIOGRAPHIC STUDIES: No results found.  ASSESSMENT & PLAN Kristina Osborne 71 y.o. female with medical history significant for essential thrombocytosis who presents for a follow up visit.   After review of the labs, review of the records, and discussion with the patient the patients findings are most consistent with thrombocytosis, reported due to essential thrombocythemia. We have received the records showing that she has a JAK2 mutation.  Additionally she will require a bone marrow biopsy in order to assure that essential thrombocythemia is the diagnosis.  In the interim we will have the patient continue her hydroxyurea as previously prescribed by her provider in [place].  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  She will require a bone marrow biopsy to confirm this diagnosis --JAK2 testing received from patient's prior hematologist.  Confirmed JAK2 mutation.  -- If and when the patient is agreeable we will schedule her for a bone marrow biopsy. --transiton to 1000 mg BID hydroxyurea due to cytopenias. Previously on 1500 mg hydroxyurea alternating with 1000 mg every other day -- Labs today show white blood cell count 3.7, hemoglobin 11.2, MCV 120, and platelets of 261 --Return to clinic in 3 months time with interval 6 week lab check.    No orders of the defined types were placed in this encounter.   All questions were answered. The patient knows to call the clinic with any problems, questions or concerns.  A total of more than 30 minutes were spent on this encounter with face-to-face time and non-face-to-face time, including preparing to see the patient, ordering tests and/or medications, counseling the patient and coordination of care as outlined above.   Ledell Peoples, MD Department of Hematology/Oncology [place] [facility] Phone: [phone] Pager: [phone] Email: [email or web address]  03/13/2022 12:57 PM

## 2022-03-16 ENCOUNTER — Other Ambulatory Visit: Payer: Self-pay | Admitting: Hematology and Oncology

## 2022-03-27 ENCOUNTER — Telehealth: Payer: Self-pay | Admitting: *Deleted

## 2022-03-27 NOTE — Telephone Encounter (Signed)
Received vm message from pt. She has seen her eye doctor today. He has prescribed Lutein and  Zeaanthin for preventive measures for Macular degeneration. She wanted to make sure it was ok to take with her Hydroxyurea.  Please advise

## 2022-03-28 ENCOUNTER — Telehealth: Payer: Self-pay | Admitting: Gastroenterology

## 2022-03-28 NOTE — Telephone Encounter (Signed)
Vitamin E can increase bleeding risk Lutein and Zeaxanthin are not known to increase bleeding risk She will have to weigh the risks, benefits of Vit E with her retinal specialist

## 2022-03-28 NOTE — Telephone Encounter (Signed)
Patient called seeking advice concerning certain medications, Lutein 10 mg, Zeaxanthin 2 mg, vitamin E 180 mg. 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 Please advise.

## 2022-03-28 NOTE — Telephone Encounter (Signed)
Spoke with patient regarding MD recommendations. Pt verbalized all understanding. 

## 2022-03-29 NOTE — Telephone Encounter (Signed)
TCT patient regarding her questions about medications her eye doctor has prescribed. Spoke with her and per Dr. Lorenso Courier, there are no interactions with those medications and her Hydrea. Pt voiced understanding.

## 2022-04-09 ENCOUNTER — Other Ambulatory Visit: Payer: Self-pay | Admitting: Hematology and Oncology

## 2022-04-15 ENCOUNTER — Emergency Department (HOSPITAL_BASED_OUTPATIENT_CLINIC_OR_DEPARTMENT_OTHER): Payer: No Typology Code available for payment source | Admitting: Radiology

## 2022-04-15 ENCOUNTER — Other Ambulatory Visit: Payer: Self-pay

## 2022-04-15 ENCOUNTER — Inpatient Hospital Stay (HOSPITAL_BASED_OUTPATIENT_CLINIC_OR_DEPARTMENT_OTHER)
Admission: EM | Admit: 2022-04-15 | Discharge: 2022-04-19 | DRG: 348 | Disposition: A | Discharge status: Home / Self Care | Payer: No Typology Code available for payment source | Attending: Family Medicine | Admitting: Family Medicine

## 2022-04-15 ENCOUNTER — Encounter (HOSPITAL_COMMUNITY): Payer: Self-pay

## 2022-04-15 DIAGNOSIS — Z79899 Other long term (current) drug therapy: Secondary | ICD-10-CM

## 2022-04-15 DIAGNOSIS — R739 Hyperglycemia, unspecified: Secondary | ICD-10-CM | POA: Diagnosis present

## 2022-04-15 DIAGNOSIS — D125 Benign neoplasm of sigmoid colon: Secondary | ICD-10-CM | POA: Diagnosis present

## 2022-04-15 DIAGNOSIS — K219 Gastro-esophageal reflux disease without esophagitis: Secondary | ICD-10-CM | POA: Diagnosis present

## 2022-04-15 DIAGNOSIS — R195 Other fecal abnormalities: Secondary | ICD-10-CM

## 2022-04-15 DIAGNOSIS — K5520 Angiodysplasia of colon without hemorrhage: Principal | ICD-10-CM | POA: Diagnosis present

## 2022-04-15 DIAGNOSIS — K6389 Other specified diseases of intestine: Secondary | ICD-10-CM

## 2022-04-15 DIAGNOSIS — D126 Benign neoplasm of colon, unspecified: Secondary | ICD-10-CM

## 2022-04-15 DIAGNOSIS — K648 Other hemorrhoids: Secondary | ICD-10-CM | POA: Diagnosis present

## 2022-04-15 DIAGNOSIS — D62 Acute posthemorrhagic anemia: Secondary | ICD-10-CM

## 2022-04-15 DIAGNOSIS — Z886 Allergy status to analgesic agent status: Secondary | ICD-10-CM

## 2022-04-15 DIAGNOSIS — Z87891 Personal history of nicotine dependence: Secondary | ICD-10-CM

## 2022-04-15 DIAGNOSIS — R002 Palpitations: Secondary | ICD-10-CM | POA: Diagnosis present

## 2022-04-15 DIAGNOSIS — Q2739 Arteriovenous malformation, other site: Secondary | ICD-10-CM

## 2022-04-15 DIAGNOSIS — R0600 Dyspnea, unspecified: Secondary | ICD-10-CM

## 2022-04-15 DIAGNOSIS — D509 Iron deficiency anemia, unspecified: Secondary | ICD-10-CM

## 2022-04-15 DIAGNOSIS — D473 Essential (hemorrhagic) thrombocythemia: Secondary | ICD-10-CM | POA: Diagnosis present

## 2022-04-15 DIAGNOSIS — D75839 Thrombocytosis, unspecified: Secondary | ICD-10-CM | POA: Diagnosis present

## 2022-04-15 DIAGNOSIS — D500 Iron deficiency anemia secondary to blood loss (chronic): Secondary | ICD-10-CM

## 2022-04-15 DIAGNOSIS — D649 Anemia, unspecified: Secondary | ICD-10-CM

## 2022-04-15 DIAGNOSIS — R079 Chest pain, unspecified: Secondary | ICD-10-CM | POA: Diagnosis present

## 2022-04-15 DIAGNOSIS — I341 Nonrheumatic mitral (valve) prolapse: Secondary | ICD-10-CM | POA: Diagnosis present

## 2022-04-15 DIAGNOSIS — Z9049 Acquired absence of other specified parts of digestive tract: Secondary | ICD-10-CM

## 2022-04-15 DIAGNOSIS — K922 Gastrointestinal hemorrhage, unspecified: Secondary | ICD-10-CM | POA: Diagnosis not present

## 2022-04-15 DIAGNOSIS — Z882 Allergy status to sulfonamides status: Secondary | ICD-10-CM

## 2022-04-15 DIAGNOSIS — K3189 Other diseases of stomach and duodenum: Secondary | ICD-10-CM | POA: Diagnosis present

## 2022-04-15 LAB — CBC
HCT: 31.4 % — ABNORMAL LOW (ref 36.0–46.0)
Hemoglobin: 9.9 g/dL — ABNORMAL LOW (ref 12.0–15.0)
MCH: 36.4 pg — ABNORMAL HIGH (ref 26.0–34.0)
MCHC: 31.5 g/dL (ref 30.0–36.0)
MCV: 115.4 fL — ABNORMAL HIGH (ref 80.0–100.0)
Platelets: 212 10*3/uL (ref 150–400)
RBC: 2.72 MIL/uL — ABNORMAL LOW (ref 3.87–5.11)
RDW: 12.6 % (ref 11.5–15.5)
WBC: 8.6 10*3/uL (ref 4.0–10.5)
nRBC: 0 % (ref 0.0–0.2)

## 2022-04-15 LAB — TROPONIN I (HIGH SENSITIVITY)
Troponin I (High Sensitivity): 4 ng/L (ref <18)
Troponin I (High Sensitivity): 8 ng/L (ref <18)

## 2022-04-15 LAB — BASIC METABOLIC PANEL
Anion gap: 14 (ref 5–15)
BUN: 16 mg/dL (ref 8–23)
CO2: 24 mmol/L (ref 22–32)
Calcium: 9.7 mg/dL (ref 8.9–10.3)
Chloride: 100 mmol/L (ref 98–111)
Creatinine, Ser: 0.96 mg/dL (ref 0.44–1.00)
GFR, Estimated: >60 mL/min (ref >60)
Glucose, Bld: 170 mg/dL — ABNORMAL HIGH (ref 70–99)
Potassium: 3.8 mmol/L (ref 3.5–5.1)
Sodium: 138 mmol/L (ref 135–145)

## 2022-04-15 LAB — BRAIN NATRIURETIC PEPTIDE: B Natriuretic Peptide: 82.3 pg/mL (ref 0.0–100.0)

## 2022-04-15 LAB — OCCULT BLOOD X 1 CARD TO LAB, STOOL: Fecal Occult Bld: POSITIVE — AB

## 2022-04-15 LAB — D-DIMER, QUANTITATIVE: D-Dimer, Quant: 0.56 ug/mL-FEU — ABNORMAL HIGH (ref 0.00–0.50)

## 2022-04-15 NOTE — ED Provider Notes (Signed)
[facility] EMERGENCY DEPT Provider Note   CSN: [id number] Arrival date & time: 04/15/22  1544     History  Chief Complaint  Patient presents with   Chest Pain   Shortness of Breath   Palpitations    Kristina Osborne is a 71 y.o. female.   Chest Pain Associated symptoms: palpitations and shortness of breath   Shortness of Breath Associated symptoms: chest pain   Palpitations Associated symptoms: chest pain and shortness of breath      Patient has a history of thrombocytosis, mitral valve prolapse, iron deficiency anemia, reflux who presents to the ED with complaints of shortness of breath.  Patient states she went for a walk today.  She noticed that she was becoming short of breath as she was walking.  Her symptoms have persisted.  She feels that her heart is racing.  She is having more difficulty catching her breath.  It occurs even at rest but is worse when she is active.  She has not had any leg swelling.  She has not had any blood in her stool.  No dark stools.  Patient denies any history of PE.  She does have a history of prior GI bleeding but does not feel like she is having any symptoms associated with that  Home Medications Prior to Admission medications   Medication Sig Start Date End Date Taking? Authorizing Provider  famotidine (PEPCID) 40 MG tablet Take 1 tablet (40 mg total) by mouth daily. 09/20/21   Ladene Artist, MD  folic acid (FOLVITE) 1 MG tablet TAKE 1 TABLET BY MOUTH EVERY DAY 04/10/22   Orson Slick, MD  hydroxyurea (HYDREA) 500 MG capsule TAKE 2 CAPS ALTERNATING WITH 3 CAPS EVERY OTHER DAY W/FOOD TO MINIMIZE GI EFFECTS 03/16/22   Orson Slick, MD  methocarbamol (ROBAXIN) 500 MG tablet Take 1 tablet (500 mg total) by mouth every 6 (six) hours as needed for muscle spasms. 01/20/22   Biagio Borg, MD  metoprolol tartrate (LOPRESSOR) 25 MG tablet Take 0.5 tablets (12.5 mg total) by mouth 2 (two) times daily. 10/07/21 11/06/21  Hilty,  Nadean Corwin, MD  predniSONE (DELTASONE) 10 MG tablet 3 tabs by mouth per day for 3 days,2tabs per day for 3 days,1tab per day for 3 days 01/20/22   Biagio Borg, MD  sucralfate (CARAFATE) 1 g tablet Take 1 tablet (1 g total) by mouth 4 (four) times daily -  with meals and at bedtime. Patient taking differently: Take 1 g by mouth 2 (two) times daily. 09/20/21 12/19/21  Ladene Artist, MD  traMADol (ULTRAM) 50 MG tablet Take 1 tablet (50 mg total) by mouth every 6 (six) hours as needed. 01/20/22   Biagio Borg, MD      Allergies    Aspirin and Sulfa antibiotics    Review of Systems   Review of Systems  Respiratory:  Positive for shortness of breath.   Cardiovascular:  Positive for chest pain and palpitations.    Physical Exam Updated Vital Signs BP (!) 106/47   Pulse 79   Temp 98.7 F (37.1 C) (Oral)   Resp 20   Ht 1.676 m ('5\' 6"'$ )   Wt 77.1 kg   LMP  (LMP Unknown)   SpO2 100%   BMI 27.44 kg/m  Physical Exam Vitals and nursing note reviewed.  Constitutional:      Appearance: She is well-developed. She is not diaphoretic.  HENT:     Head: Normocephalic and  atraumatic.     Right Ear: External ear normal.     Left Ear: External ear normal.  Eyes:     General: No scleral icterus.       Right eye: No discharge.        Left eye: No discharge.     Conjunctiva/sclera: Conjunctivae normal.  Neck:     Trachea: No tracheal deviation.  Cardiovascular:     Rate and Rhythm: Normal rate and regular rhythm.  Pulmonary:     Effort: Pulmonary effort is normal. No respiratory distress.     Breath sounds: Normal breath sounds. No stridor. No wheezing or rales.  Abdominal:     General: Bowel sounds are normal. There is no distension.     Palpations: Abdomen is soft.     Tenderness: There is no abdominal tenderness. There is no guarding or rebound.  Musculoskeletal:        General: No tenderness or deformity.     Cervical back: Neck supple.  Skin:    General: Skin is warm and dry.      Findings: No rash.  Neurological:     General: No focal deficit present.     Mental Status: She is alert.     Cranial Nerves: No cranial nerve deficit (no facial droop, extraocular movements intact, no slurred speech).     Sensory: No sensory deficit.     Motor: No abnormal muscle tone or seizure activity.     Coordination: Coordination normal.  Psychiatric:        Mood and Affect: Mood is anxious.     ED Results / Procedures / Treatments   Labs (all labs ordered are listed, but only abnormal results are displayed) Labs Reviewed  BASIC METABOLIC PANEL - Abnormal; Notable for the following components:      Result Value   Glucose, Bld 170 (*)    All other components within normal limits  CBC - Abnormal; Notable for the following components:   RBC 2.72 (*)    Hemoglobin 9.9 (*)    HCT 31.4 (*)    MCV 115.4 (*)    MCH 36.4 (*)    All other components within normal limits  D-DIMER, QUANTITATIVE - Abnormal; Notable for the following components:   D-Dimer, Quant 0.56 (*)    All other components within normal limits  OCCULT BLOOD X 1 CARD TO LAB, STOOL - Abnormal; Notable for the following components:   Fecal Occult Bld POSITIVE (*)    All other components within normal limits  BRAIN NATRIURETIC PEPTIDE  IRON AND TIBC  TROPONIN I (HIGH SENSITIVITY)  TROPONIN I (HIGH SENSITIVITY)    EKG EKG Interpretation  Date/Time:  Saturday April 15 2022 15:57:14 EDT Ventricular Rate:  110 PR Interval:  176 QRS Duration: 80 QT Interval:  326 QTC Calculation: 441 R Axis:   47 Text Interpretation: Sinus tachycardia Possible Left atrial enlargement Nonspecific ST and T wave abnormality Abnormal ECG When compared with ECG of 15-Apr-2022 15:56, No significant change was found Confirmed by Dorie Rank [phone]) on 04/15/2022 4:02:03 PM  Radiology DG Chest 2 View  Result Date: 04/15/2022 CLINICAL DATA:  Shortness of breath EXAM: CHEST - 2 VIEW COMPARISON:  10/05/2021 FINDINGS: Prior median  sternotomy. Mitral valve annular calcifications. Heart is normal size. Lungs clear. No effusions or edema. No acute bony abnormality. IMPRESSION: No active cardiopulmonary disease. Electronically Signed   By: Rolm Baptise M.D.   On: 04/15/2022 17:06    Procedures Procedures  Medications Ordered in ED Medications - No data to display  ED Course/ Medical Decision Making/ A&P Clinical Course as of 04/15/22 2103  Sat Apr 15, 2022  1654 CBC(!) Hemoglobin decreased compared to 1 month ago [JK]  1655 Troponin I (High Sensitivity) Normal [JK]  1825 Brain natriuretic peptide BnP normal [JK]  1942 Occult blood card to lab, stool(!) Positive [JK]  1942 D-dimer, quantitative(!) D-dimer slightly out of 0.56 but within age-adjusted normal range [JK]  1943 Brain natriuretic peptide Normal [JK]  2103 Case discussed with Dr. Bridgett Larsson [JK]    Clinical Course User Index [JK] Dorie Rank, MD                           Medical Decision Making Problems Addressed: Anemia, unspecified type: chronic illness or injury with exacerbation, progression, or side effects of treatment Dyspnea, unspecified type: acute illness or injury that poses a threat to life or bodily functions Gastrointestinal hemorrhage, unspecified gastrointestinal hemorrhage type: acute illness or injury that poses a threat to life or bodily functions  Amount and/or Complexity of Data Reviewed Labs: ordered. Decision-making details documented in ED Course. Radiology: ordered.  Risk Decision regarding hospitalization.   Presented to the ER for evaluation of shortness of breath, weakness heart racing.  Patient evaluated for acute cardiac ischemia, CHF, PE.  ED work-up overall reassuring.  D-dimer slightly elevated 0.56 but within age-adjusted normal range.  Patient however does have evidence of anemia with a hemoglobin of 9.9.  This is worsened.  Previous values.  Patient does have history of prior GI bleeding.  Hemoccult was performed  and is positive.  No gross melena but it was dark in color.  Suspect this could be causing her symptoms.  Otherwise her oxygen level has been normal here.  She has no tachypnea no wheezing.  Does have history of prior GI bleeding requiring transient lesions.  I will consult with the medical service for admission to hospital.  Secure chat message sent to [facility]        Final Clinical Impression(s) / ED Diagnoses Final diagnoses:  Gastrointestinal hemorrhage, unspecified gastrointestinal hemorrhage type  Anemia, unspecified type  Dyspnea, unspecified type    Rx / DC Orders ED Discharge Orders     None         Dorie Rank, MD 04/15/22 2103

## 2022-04-15 NOTE — ED Triage Notes (Signed)
Patient arrives ambulatory to [facility] with complaints of increased shortness of breath, dizziness, and heart racing that started has become progressively worse since this morning.   Rates chest pain a 3/10.  Has a history of intermittent PVC's and took a dose of propanolol prior to arrival.

## 2022-04-15 NOTE — Progress Notes (Signed)
  [facility] will assume care on arrival to accepting facility. Until arrival, care as per EDP. However, [facility] available 24/7 for questions and assistance.   Nursing staff please page [facility] Admits and Consults ([phone]) as soon as the patient arrives to the hospital.  Ilissa Rosner, DO [facility]  

## 2022-04-16 ENCOUNTER — Encounter (HOSPITAL_COMMUNITY): Payer: Self-pay | Admitting: Internal Medicine

## 2022-04-16 DIAGNOSIS — K529 Noninfective gastroenteritis and colitis, unspecified: Secondary | ICD-10-CM

## 2022-04-16 DIAGNOSIS — K922 Gastrointestinal hemorrhage, unspecified: Secondary | ICD-10-CM | POA: Diagnosis present

## 2022-04-16 DIAGNOSIS — D125 Benign neoplasm of sigmoid colon: Secondary | ICD-10-CM | POA: Diagnosis not present

## 2022-04-16 DIAGNOSIS — Z9049 Acquired absence of other specified parts of digestive tract: Secondary | ICD-10-CM | POA: Diagnosis not present

## 2022-04-16 DIAGNOSIS — R002 Palpitations: Secondary | ICD-10-CM | POA: Diagnosis not present

## 2022-04-16 DIAGNOSIS — K921 Melena: Secondary | ICD-10-CM | POA: Diagnosis not present

## 2022-04-16 DIAGNOSIS — D62 Acute posthemorrhagic anemia: Secondary | ICD-10-CM

## 2022-04-16 DIAGNOSIS — Z886 Allergy status to analgesic agent status: Secondary | ICD-10-CM | POA: Diagnosis not present

## 2022-04-16 DIAGNOSIS — K5520 Angiodysplasia of colon without hemorrhage: Secondary | ICD-10-CM | POA: Diagnosis not present

## 2022-04-16 DIAGNOSIS — D649 Anemia, unspecified: Secondary | ICD-10-CM | POA: Diagnosis not present

## 2022-04-16 DIAGNOSIS — Q2739 Arteriovenous malformation, other site: Secondary | ICD-10-CM | POA: Diagnosis not present

## 2022-04-16 DIAGNOSIS — K3189 Other diseases of stomach and duodenum: Secondary | ICD-10-CM | POA: Diagnosis not present

## 2022-04-16 DIAGNOSIS — D473 Essential (hemorrhagic) thrombocythemia: Secondary | ICD-10-CM | POA: Diagnosis not present

## 2022-04-16 DIAGNOSIS — R739 Hyperglycemia, unspecified: Secondary | ICD-10-CM | POA: Diagnosis not present

## 2022-04-16 DIAGNOSIS — K648 Other hemorrhoids: Secondary | ICD-10-CM | POA: Diagnosis not present

## 2022-04-16 DIAGNOSIS — Z79899 Other long term (current) drug therapy: Secondary | ICD-10-CM | POA: Diagnosis not present

## 2022-04-16 DIAGNOSIS — Z882 Allergy status to sulfonamides status: Secondary | ICD-10-CM | POA: Diagnosis not present

## 2022-04-16 DIAGNOSIS — K219 Gastro-esophageal reflux disease without esophagitis: Secondary | ICD-10-CM | POA: Diagnosis not present

## 2022-04-16 DIAGNOSIS — R079 Chest pain, unspecified: Secondary | ICD-10-CM | POA: Diagnosis not present

## 2022-04-16 DIAGNOSIS — Z87891 Personal history of nicotine dependence: Secondary | ICD-10-CM | POA: Diagnosis not present

## 2022-04-16 DIAGNOSIS — I341 Nonrheumatic mitral (valve) prolapse: Secondary | ICD-10-CM | POA: Diagnosis not present

## 2022-04-16 HISTORY — DX: Acute posthemorrhagic anemia: D62

## 2022-04-16 LAB — TROPONIN I (HIGH SENSITIVITY)
Troponin I (High Sensitivity): 3 ng/L (ref <18)
Troponin I (High Sensitivity): 3 ng/L (ref <18)

## 2022-04-16 LAB — HEMOGLOBIN A1C
Hgb A1c MFr Bld: 4.4 % — ABNORMAL LOW (ref 4.8–5.6)
Mean Plasma Glucose: 79.58 mg/dL

## 2022-04-16 LAB — TSH: TSH: 2.216 u[IU]/mL (ref 0.350–4.500)

## 2022-04-16 LAB — HIV ANTIBODY (ROUTINE TESTING W REFLEX): HIV Screen 4th Generation wRfx: NONREACTIVE

## 2022-04-16 LAB — IRON AND TIBC
Iron: 33 ug/dL (ref 28–170)
Saturation Ratios: 8 % — ABNORMAL LOW (ref 10.4–31.8)
TIBC: 407 ug/dL (ref 250–450)
UIBC: 374 ug/dL

## 2022-04-16 LAB — HEMOGLOBIN AND HEMATOCRIT, BLOOD
HCT: 28.5 % — ABNORMAL LOW (ref 36.0–46.0)
Hemoglobin: 9.2 g/dL — ABNORMAL LOW (ref 12.0–15.0)

## 2022-04-16 LAB — MAGNESIUM: Magnesium: 2.1 mg/dL (ref 1.7–2.4)

## 2022-04-16 MED ORDER — ONDANSETRON HCL 4 MG/2ML IJ SOLN
4.0000 mg | Freq: Four times a day (QID) | INTRAMUSCULAR | Status: DC | PRN
  Administered 2022-04-17 (×2): 4 mg via INTRAVENOUS
  Filled 2022-04-16 (×2): qty 2

## 2022-04-16 MED ORDER — PEG-KCL-NACL-NASULF-NA ASC-C 100 G PO SOLR
0.5000 | Freq: Once | ORAL | Status: AC
  Administered 2022-04-17: 100 g via ORAL

## 2022-04-16 MED ORDER — PEG-KCL-NACL-NASULF-NA ASC-C 100 G PO SOLR: 1.0000 | Freq: Once | ORAL | Status: DC

## 2022-04-16 MED ORDER — HYDROXYUREA 500 MG PO CAPS: 1000.0000 mg | ORAL_CAPSULE | Freq: Once | ORAL | Status: DC

## 2022-04-16 MED ORDER — ACETAMINOPHEN 325 MG PO TABS
650.0000 mg | ORAL_TABLET | Freq: Four times a day (QID) | ORAL | Status: DC | PRN
  Administered 2022-04-16 – 2022-04-17 (×2): 650 mg via ORAL
  Filled 2022-04-16 (×2): qty 2

## 2022-04-16 MED ORDER — ACETAMINOPHEN 650 MG RE SUPP: 650.0000 mg | Freq: Four times a day (QID) | RECTAL | Status: DC | PRN

## 2022-04-16 MED ORDER — BISACODYL 5 MG PO TBEC
20.0000 mg | DELAYED_RELEASE_TABLET | Freq: Once | ORAL | Status: AC
  Administered 2022-04-16: 20 mg via ORAL
  Filled 2022-04-16: qty 4

## 2022-04-16 MED ORDER — LACTATED RINGERS IV SOLN: INTRAVENOUS | Status: DC

## 2022-04-16 MED ORDER — PANTOPRAZOLE SODIUM 40 MG IV SOLR
40.0000 mg | Freq: Two times a day (BID) | INTRAVENOUS | Status: DC
Start: 2022-04-16 — End: 2022-04-17
  Administered 2022-04-16 – 2022-04-17 (×2): 40 mg via INTRAVENOUS
  Filled 2022-04-16 (×2): qty 10

## 2022-04-16 MED ORDER — FOLIC ACID 1 MG PO TABS
1.0000 mg | ORAL_TABLET | Freq: Every day | ORAL | Status: DC
  Administered 2022-04-16 – 2022-04-19 (×4): 1 mg via ORAL
  Filled 2022-04-16 (×4): qty 1

## 2022-04-16 MED ORDER — ONDANSETRON HCL 4 MG PO TABS: 4.0000 mg | ORAL_TABLET | Freq: Four times a day (QID) | ORAL | Status: DC | PRN

## 2022-04-16 MED ORDER — HYDROXYUREA 500 MG PO CAPS
500.0000 mg | ORAL_CAPSULE | Freq: Two times a day (BID) | ORAL | Status: DC
  Administered 2022-04-16 – 2022-04-17 (×2): 500 mg via ORAL
  Filled 2022-04-16 (×2): qty 1

## 2022-04-16 MED ORDER — METHOCARBAMOL 500 MG PO TABS: 500.0000 mg | ORAL_TABLET | Freq: Four times a day (QID) | ORAL | Status: DC | PRN

## 2022-04-16 MED ORDER — PEG-KCL-NACL-NASULF-NA ASC-C 100 G PO SOLR
0.5000 | Freq: Once | ORAL | Status: AC
  Administered 2022-04-16: 100 g via ORAL
  Filled 2022-04-16: qty 1

## 2022-04-16 MED ORDER — TRAMADOL HCL 50 MG PO TABS
50.0000 mg | ORAL_TABLET | Freq: Four times a day (QID) | ORAL | Status: DC | PRN
  Administered 2022-04-16 – 2022-04-17 (×2): 50 mg via ORAL
  Filled 2022-04-16 (×2): qty 1

## 2022-04-16 MED ORDER — METOPROLOL TARTRATE 25 MG PO TABS
12.5000 mg | ORAL_TABLET | Freq: Two times a day (BID) | ORAL | Status: DC
  Administered 2022-04-16: 12.5 mg via ORAL
  Filled 2022-04-16 (×3): qty 1

## 2022-04-16 MED ORDER — PANTOPRAZOLE SODIUM 40 MG IV SOLR
40.0000 mg | Freq: Once | INTRAVENOUS | Status: AC
  Administered 2022-04-16: 40 mg via INTRAVENOUS
  Filled 2022-04-16: qty 10

## 2022-04-16 NOTE — H&P (Signed)
History and Physical    [name] QTM:[id number] DOB: [birth year] DOA: 04/15/2022  PCP: Biagio Borg, MD  Patient coming from: Home  I have personally briefly reviewed patient's old medical records in [place]  Chief Complaint: Shortness of breath/palpitation  HPI: Kristina Osborne is a 71 y.o. female with medical history significant of essential thrombosis, JAK2 mutation, mitral valve prolapse, GERD and AVM of small intestine presented to ED with the complaint of shortness of breath.  Per patient, she usually walks about 1.5 miles daily but today she felt very short of breath.  She had to take rest and even after taking an hour of rest, she still continued to have shortness of breath along with some chest pain and palpitation.  Upon further questioning, she tells me that she has possibly noticed 1 episode of melena but she could not be sure.  She further tells me that she moved here from [place] in last August.  Prior to that, she was in [place] and she was diagnosed with JAK2 mutation about 2 years ago and received iron infusion twice a month until she moved here and then she established care with Dr. Lorenso Courier who told her that she does not need any IV iron anymore and according to her, she has been fine ever since.  ED Course: Upon arrival to ED, she was hemodynamically stable with intermittent borderline low blood pressure.  Blood glucose was 170 but rest of the BMP was normal.  Hemoglobin 9.9 which was 11.2 about a month ago and 13.35 months ago.  She received 1 dose of IV Protonix and hospital service was requested to admit the patient for further work-up of possible GI bleed.  Review of Systems: As per HPI otherwise negative.    Past Medical History:  Diagnosis Date   ABLA (acute blood loss anemia) 04/16/2022   Congenital malformation of intestinal fixation    Gastritis    Gastroesophageal reflux disease without esophagitis 09/10/2021   IDA (iron deficiency anemia)     Mitral valve prolapse    Thrombocytosis     Past Surgical History:  Procedure Laterality Date   SMALL INTESTINE SURGERY  1994     reports that she has quit smoking. Her smoking use included cigarettes. She has never used smokeless tobacco. She reports that she does not currently use alcohol. She reports that she does not use drugs.  Allergies  Allergen Reactions   Aspirin Other (See Comments)    "Due to intestinal issue"   Sulfa Antibiotics     Family History  Problem Relation Age of Onset   Heart disease Mother    Valvular heart disease Mother    Healthy Sister    Alcoholism Half-Sister     Prior to Admission medications   Medication Sig Start Date End Date Taking? Authorizing Provider  famotidine (PEPCID) 40 MG tablet Take 1 tablet (40 mg total) by mouth daily. 09/20/21   Ladene Artist, MD  folic acid (FOLVITE) 1 MG tablet TAKE 1 TABLET BY MOUTH EVERY DAY 04/10/22   Orson Slick, MD  hydroxyurea (HYDREA) 500 MG capsule TAKE 2 CAPS ALTERNATING WITH 3 CAPS EVERY OTHER DAY W/FOOD TO MINIMIZE GI EFFECTS 03/16/22   Orson Slick, MD  methocarbamol (ROBAXIN) 500 MG tablet Take 1 tablet (500 mg total) by mouth every 6 (six) hours as needed for muscle spasms. 01/20/22   Biagio Borg, MD  metoprolol tartrate (LOPRESSOR) 25 MG tablet Take 0.5 tablets (  12.5 mg total) by mouth 2 (two) times daily. 10/07/21 11/06/21  Hilty, Nadean Corwin, MD  predniSONE (DELTASONE) 10 MG tablet 3 tabs by mouth per day for 3 days,2tabs per day for 3 days,1tab per day for 3 days 01/20/22   Biagio Borg, MD  sucralfate (CARAFATE) 1 g tablet Take 1 tablet (1 g total) by mouth 4 (four) times daily -  with meals and at bedtime. Patient taking differently: Take 1 g by mouth 2 (two) times daily. 09/20/21 12/19/21  Ladene Artist, MD  traMADol (ULTRAM) 50 MG tablet Take 1 tablet (50 mg total) by mouth every 6 (six) hours as needed. 01/20/22   Biagio Borg, MD    Physical Exam: Vitals:   04/16/22 1301 04/16/22  1424 04/16/22 1445 04/16/22 1526  BP: 99/63  98/65 (!) 108/57  Pulse: 70  73 73  Resp: '14  16 18  '$ Temp:  97.9 F (36.6 C)  97.9 F (36.6 C)  TempSrc:  Oral  Oral  SpO2: 100%  100% 99%  Weight:      Height:        Constitutional: NAD, calm, comfortable Vitals:   04/16/22 1301 04/16/22 1424 04/16/22 1445 04/16/22 1526  BP: 99/63  98/65 (!) 108/57  Pulse: 70  73 73  Resp: '14  16 18  '$ Temp:  97.9 F (36.6 C)  97.9 F (36.6 C)  TempSrc:  Oral  Oral  SpO2: 100%  100% 99%  Weight:      Height:       Eyes: PERRL, lids and conjunctivae normal ENMT: Mucous membranes are moist. Posterior pharynx clear of any exudate or lesions.Normal dentition.  Neck: normal, supple, no masses, no thyromegaly Respiratory: clear to auscultation bilaterally, no wheezing, no crackles. Normal respiratory effort. No accessory muscle use.  Cardiovascular: Regular rate and rhythm, no murmurs / rubs / gallops. No extremity edema. 2+ pedal pulses. No carotid bruits.  Abdomen: no tenderness, no masses palpated. No hepatosplenomegaly. Bowel sounds positive.  Musculoskeletal: no clubbing / cyanosis. No joint deformity upper and lower extremities. Good ROM, no contractures. Normal muscle tone.  Skin: no rashes, lesions, ulcers. No induration Neurologic: CN 2-12 grossly intact. Sensation intact, DTR normal. Strength 5/5 in all 4.  Psychiatric: Normal judgment and insight. Alert and oriented x 3. Normal mood.    Labs on Admission: I have personally reviewed following labs and imaging studies  CBC: Recent Labs  Lab 04/15/22 1557 04/16/22 0701  WBC 8.6  --   HGB 9.9* 9.2*  HCT 31.4* 28.5*  MCV 115.4*  --   PLT 212  --    Basic Metabolic Panel: Recent Labs  Lab 04/15/22 1557  NA 138  K 3.8  CL 100  CO2 24  GLUCOSE 170*  BUN 16  CREATININE 0.96  CALCIUM 9.7   GFR: Estimated Creatinine Clearance: 57.2 mL/min (by C-G formula based on SCr of 0.96 mg/dL). Liver Function Tests: No results for  input(s): "AST", "ALT", "ALKPHOS", "BILITOT", "PROT", "ALBUMIN" in the last 168 hours. No results for input(s): "LIPASE", "AMYLASE" in the last 168 hours. No results for input(s): "AMMONIA" in the last 168 hours. Coagulation Profile: No results for input(s): "INR", "PROTIME" in the last 168 hours. Cardiac Enzymes: No results for input(s): "CKTOTAL", "CKMB", "CKMBINDEX", "TROPONINI" in the last 168 hours. BNP (last 3 results) No results for input(s): "PROBNP" in the last 8760 hours. HbA1C: No results for input(s): "HGBA1C" in the last 72 hours. CBG: No results for input(s): "GLUCAP"  in the last 168 hours. Lipid Profile: No results for input(s): "CHOL", "HDL", "LDLCALC", "TRIG", "CHOLHDL", "LDLDIRECT" in the last 72 hours. Thyroid Function Tests: No results for input(s): "TSH", "T4TOTAL", "FREET4", "T3FREE", "THYROIDAB" in the last 72 hours. Anemia Panel: No results for input(s): "VITAMINB12", "FOLATE", "FERRITIN", "TIBC", "IRON", "RETICCTPCT" in the last 72 hours. Urine analysis: No results found for: "COLORURINE", "APPEARANCEUR", "LABSPEC", "PHURINE", "GLUCOSEU", "HGBUR", "BILIRUBINUR", "KETONESUR", "PROTEINUR", "UROBILINOGEN", "NITRITE", "LEUKOCYTESUR"  Radiological Exams on Admission: DG Chest 2 View  Result Date: 04/15/2022 CLINICAL DATA:  Shortness of breath EXAM: CHEST - 2 VIEW COMPARISON:  10/05/2021 FINDINGS: Prior median sternotomy. Mitral valve annular calcifications. Heart is normal size. Lungs clear. No effusions or edema. No acute bony abnormality. IMPRESSION: No active cardiopulmonary disease. Electronically Signed   By: Rolm Baptise M.D.   On: 04/15/2022 17:06    EKG: Independently reviewed.  Sinus tachycardia with left atrial enlargement and nonspecific ST-T wave changes.  Assessment/Plan Principal Problem:   GI bleeding Active Problems:   Essential thrombocytosis (HCC)   AVM (arteriovenous malformation) of small bowel, acquired   MVP (mitral valve prolapse)    Gastroesophageal reflux disease without esophagitis   Palpitations   ABLA (acute blood loss anemia)   Symptomatic anemia   Symptomatic anemia/acute blood loss anemia/possible upper GI bleed: Prior history of intestinal AVM.  She was FOBT positive in the ED.  We will start her on twice daily Protonix.  GI is consulted.  Will started on clears and n.p.o. from midnight for preparation for enteroscopy in the morning per Dr. Tarri Glenn.  Based on that, GI will decide if she needs colonoscopy.  No indication of blood transfusion at the moment.  Repeat CBC in the morning.  Essential thrombosis/JAK2 mutation: Resume hydroxyurea.  Chest pain: Likely due to anemia but we will rule out ACS with serial troponin.  Hyperglycemia: We will check hemoglobin A1c.  DVT prophylaxis: SCDs Start: 04/16/22 1606 Code Status: Full code Family Communication: None present at bedside.  Plan of care discussed with patient in length and he verbalized understanding and agreed with it. Disposition Plan: Potential home in next 1 to 2 days. Consults called: GI  Darliss Cheney MD [facility]  *Please note that this is a verbal dictation therefore any spelling or grammatical errors are due to the "[employer]" system interpretation.  Please page via [place] and do not message via secure chat for urgent patient care matters. Secure chat can be used for non urgent patient care matters. 04/16/2022, 4:07 PM  To contact the attending provider between 7A-7P or the covering provider during after hours 7P-7A, please log into the web site [email or web address]

## 2022-04-16 NOTE — Consult Note (Signed)
Consultation  Referring Provider: [facility]/ Pahwani  Primary Care Physician:  Biagio Borg, MD Primary Gastroenterologist:  Dr.Stark   Reason for Consultation:  anemia, heme positive stool   HPI: Kristina Osborne is a 71 y.o. female, known to Dr. Fuller Plan who presented to the emergency room at [facility] yesterday evening with complaints of shortness of breath, palpitations and mild chest pain. The history is obtained through the patient, review of her electronic health record, and her husband who joined [place] at the end of my evaluation.    She has history of PVCs and took a dose of propranolol prior to arrival at the ER. 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   Over the last week, she has had progressive dyspnea on exertion to the point that she had to stop gardening. She sought evaluation in the ED when she started to experience palpitations while at rest.  She thought she saw some maroon blood in her stool on a couple of occassions over the last week but otherwise denies any melena or other overt bleeding.   Evaluation in the ER with chest x-ray negative Labs 04/15/2022 WBC 8.6/hemoglobin 9.9/hematocrit 31.4, MCV 115, platelets 212 Hgb 1 month ago 11.2/hematocrit 33.6 Glucose 170/BUN 16/creatinine 0.96 Troponin negative D-dimer 0.56 BNP 82.3 Stool for occult blood positive.  Repeat labs this a.m. hemoglobin 9.2/hematocrit 28.5 Iron studies pending  Decision was made to admit her and she was transferred to [facility].  Per Dr. Lynne Leader most recent note as of January 2023 patient has history of essential thrombocytosis,JAK 2 + is followed by Dr. Lorenso Courier, and is on hydroxyurea.  She has history of recurrent occult and overt GI bleeding.  She is status post a right hemicolectomy and terminal ileal resection for AVMs in 1995, and then underwent a subsequent segmental jejunal  resection for bleeding AVMs remotely as well but did not recall the date. Subsequent GI evaluations have not shown a source for her bleeding though small bowel AVMs had been suspected.  Her last GI evaluation had been done in [place] by Dr. Laruth Bouchard. I cannot see the results of most recent colonoscopy though reportedly that was negative She had EGD in October 2020 which showed multiple nonbleeding gastric ulcers the largest 3 mm and grade a esophagitis. Capsule endoscopy October 2020 showed a small clot in the jejunum, no visible lesion, and scattered phlebectasia's. She subsequently had enteroscopy December 2020 which was negative.  Enteroscopy February 2021 normal, patent surgical anastomosis in the proximal jejunum with mild erythema EGD April 2021 normal.  Recent hematology visit July 2023 noted to have cytopenias with WBC 3.7/hemoglobin 11.2/platelets 261 and dose of hydroxyurea was decreased  She was previously treated with frequent IV iron. However, she has not required this since moving to [place] 2 years ago.  Baseline bowel habits are 4-6 loose stools daily. There has been no recent change.    Past Medical History:  Diagnosis Date   Congenital malformation of intestinal fixation    Gastritis    Gastroesophageal reflux disease without esophagitis 09/10/2021   IDA (iron deficiency anemia)    Mitral valve prolapse    Thrombocytosis     Past Surgical History:  Procedure Laterality Date   SMALL INTESTINE SURGERY  1994    Prior to Admission medications   Medication Sig Start Date End Date Taking? Authorizing Provider  famotidine (PEPCID) 40 MG tablet Take 1 tablet (  40 mg total) by mouth daily. 09/20/21   Ladene Artist, MD  folic acid (FOLVITE) 1 MG tablet TAKE 1 TABLET BY MOUTH EVERY DAY 04/10/22   Orson Slick, MD  hydroxyurea (HYDREA) 500 MG capsule TAKE 2 CAPS ALTERNATING WITH 3 CAPS EVERY OTHER DAY W/FOOD TO MINIMIZE GI EFFECTS 03/16/22   Orson Slick, MD   methocarbamol (ROBAXIN) 500 MG tablet Take 1 tablet (500 mg total) by mouth every 6 (six) hours as needed for muscle spasms. 01/20/22   Biagio Borg, MD  metoprolol tartrate (LOPRESSOR) 25 MG tablet Take 0.5 tablets (12.5 mg total) by mouth 2 (two) times daily. 10/07/21 11/06/21  Hilty, Nadean Corwin, MD  predniSONE (DELTASONE) 10 MG tablet 3 tabs by mouth per day for 3 days,2tabs per day for 3 days,1tab per day for 3 days 01/20/22   Biagio Borg, MD  sucralfate (CARAFATE) 1 g tablet Take 1 tablet (1 g total) by mouth 4 (four) times daily -  with meals and at bedtime. Patient taking differently: Take 1 g by mouth 2 (two) times daily. 09/20/21 12/19/21  Ladene Artist, MD  traMADol (ULTRAM) 50 MG tablet Take 1 tablet (50 mg total) by mouth every 6 (six) hours as needed. 01/20/22   Biagio Borg, MD    Current Facility-Administered Medications  Medication Dose Route Frequency Provider Last Rate Last Admin   lactated ringers infusion   Intravenous Continuous Elgie Congo, MD 75 mL/hr at 04/16/22 0851 New Bag at 04/16/22 0851    Allergies as of 04/15/2022 - Review Complete 04/15/2022  Allergen Reaction Noted   Aspirin Other (See Comments) 09/01/2021   Sulfa antibiotics  04/08/2021    Family History  Problem Relation Age of Onset   Heart disease Mother    Valvular heart disease Mother    Healthy Sister    Alcoholism Half-Sister     Social History   Socioeconomic History   Marital status: Married    Spouse name: Not on file   Number of children: 0   Years of education: Not on file   Highest education level: Not on file  Occupational History   Not on file  Tobacco Use   Smoking status: Former    Years: 15.00    Types: Cigarettes   Smokeless tobacco: Never  Vaping Use   Vaping Use: Never used  Substance and Sexual Activity   Alcohol use: Not Currently   Drug use: Never   Sexual activity: Yes    Partners: Male  Other Topics Concern   Not on file  Social History Narrative    Not on file   Social Determinants of Health   Financial Resource Strain: Not on file  Food Insecurity: Not on file  Transportation Needs: Not on file  Physical Activity: Not on file  Stress: Not on file  Social Connections: Not on file  Intimate Partner Violence: Not on file    Physical Exam: Vital signs in last 24 hours: Temp:  [97.9 F (36.6 C)-98.8 F (37.1 C)] 97.9 F (36.6 C) (08/20 1526) Pulse Rate:  [68-111] 73 (08/20 1526) Resp:  [14-22] 18 (08/20 1526) BP: (94-137)/(47-80) 108/57 (08/20 1526) SpO2:  [94 %-100 %] 99 % (08/20 1526) Weight:  [77.1 kg] 77.1 kg (08/19 1556) Last BM Date : 04/16/22  Physical Exam: General:   Alert,  well-nourished, pleasant and cooperative in NAD. No pallor. Head:  Normocephalic and atraumatic. No alopecia.  Eyes:  Sclera clear, no icterus.  Conjunctiva pink. Ears:  Normal auditory acuity. Nose:  No deformity, discharge,  or lesions. Mouth:  No deformity or lesions.  No atrophic glossitis. No cheilosis. Neck:  Supple; no masses or thyromegaly. Lungs:  Clear throughout to auscultation.   No wheezes. Heart:  Regular rate and rhythm; no murmurs. Abdomen:  Soft, nontender, nondistended, normal bowel sounds, no rebound or guarding. No hepatosplenomegaly.   Rectal:  Deferred  Msk:  Symmetrical. No boney deformities LAD: No inguinal or umbilical LAD Extremities:  No clubbing or edema. Neurologic:  Alert and  oriented x4;  grossly nonfocal Skin:  No rash or bruise. No koilonychia.  Psych:  Alert and cooperative. Normal mood and affect.   Intake/Output from previous day: No intake/output data recorded. Intake/Output this shift: No intake/output data recorded.  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 On hydroxyurea.   Suspected recurrent AVM bleeding.  Discussed evaluation strategies with the patient. She would like to proceed with enteroscopy and colonoscopy with possible APC therapy if additional AVMs are identified tomorrow. She expressed concerns about a capsule endoscopy in the future due to risk for retention.   PLAN: - Push enteroscopy and colonoscopy 04/17/22 with Dr. Hilarie Fredrickson - Serial hgb/hct with transfusion as indicated in the meantime - Iron studies to determine need to resume IV iron - Consider trial of colestipol 1g with meals to minimize diarrhea  Thornton Park, MD, MPH [facility]

## 2022-04-16 NOTE — H&P (View-Only) (Signed)
Consultation  Referring Provider: [facility]/ Pahwani  Primary Care Physician:  Biagio Borg, MD Primary Gastroenterologist:  Dr.Stark   Reason for Consultation:  anemia, heme positive stool   HPI: Kristina Osborne is a 71 y.o. female, known to Dr. Fuller Plan who presented to the emergency room at [facility] yesterday evening with complaints of shortness of breath, palpitations and mild chest pain. The history is obtained through the patient, review of her electronic health record, and her husband who joined [place] at the end of my evaluation.    She has history of PVCs and took a dose of propranolol prior to arrival at the ER. 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   Over the last week, she has had progressive dyspnea on exertion to the point that she had to stop gardening. She sought evaluation in the ED when she started to experience palpitations while at rest.  She thought she saw some maroon blood in her stool on a couple of occassions over the last week but otherwise denies any melena or other overt bleeding.   Evaluation in the ER with chest x-ray negative Labs 04/15/2022 WBC 8.6/hemoglobin 9.9/hematocrit 31.4, MCV 115, platelets 212 Hgb 1 month ago 11.2/hematocrit 33.6 Glucose 170/BUN 16/creatinine 0.96 Troponin negative D-dimer 0.56 BNP 82.3 Stool for occult blood positive.  Repeat labs this a.m. hemoglobin 9.2/hematocrit 28.5 Iron studies pending  Decision was made to admit her and she was transferred to [facility].  Per Dr. Lynne Leader most recent note as of January 2023 patient has history of essential thrombocytosis,JAK 2 + is followed by Dr. Lorenso Courier, and is on hydroxyurea.  She has history of recurrent occult and overt GI bleeding.  She is status post a right hemicolectomy and terminal ileal resection for AVMs in 1995, and then underwent a subsequent segmental jejunal  resection for bleeding AVMs remotely as well but did not recall the date. Subsequent GI evaluations have not shown a source for her bleeding though small bowel AVMs had been suspected.  Her last GI evaluation had been done in [place] by Dr. Laruth Bouchard. I cannot see the results of most recent colonoscopy though reportedly that was negative She had EGD in October 2020 which showed multiple nonbleeding gastric ulcers the largest 3 mm and grade a esophagitis. Capsule endoscopy October 2020 showed a small clot in the jejunum, no visible lesion, and scattered phlebectasia's. She subsequently had enteroscopy December 2020 which was negative.  Enteroscopy February 2021 normal, patent surgical anastomosis in the proximal jejunum with mild erythema EGD April 2021 normal.  Recent hematology visit July 2023 noted to have cytopenias with WBC 3.7/hemoglobin 11.2/platelets 261 and dose of hydroxyurea was decreased  She was previously treated with frequent IV iron. However, she has not required this since moving to [place] 2 years ago.  Baseline bowel habits are 4-6 loose stools daily. There has been no recent change.    Past Medical History:  Diagnosis Date   Congenital malformation of intestinal fixation    Gastritis    Gastroesophageal reflux disease without esophagitis 09/10/2021   IDA (iron deficiency anemia)    Mitral valve prolapse    Thrombocytosis     Past Surgical History:  Procedure Laterality Date   SMALL INTESTINE SURGERY  1994    Prior to Admission medications   Medication Sig Start Date End Date Taking? Authorizing Provider  famotidine (PEPCID) 40 MG tablet Take 1 tablet (  40 mg total) by mouth daily. 09/20/21   Ladene Artist, MD  folic acid (FOLVITE) 1 MG tablet TAKE 1 TABLET BY MOUTH EVERY DAY 04/10/22   Orson Slick, MD  hydroxyurea (HYDREA) 500 MG capsule TAKE 2 CAPS ALTERNATING WITH 3 CAPS EVERY OTHER DAY W/FOOD TO MINIMIZE GI EFFECTS 03/16/22   Orson Slick, MD   methocarbamol (ROBAXIN) 500 MG tablet Take 1 tablet (500 mg total) by mouth every 6 (six) hours as needed for muscle spasms. 01/20/22   Biagio Borg, MD  metoprolol tartrate (LOPRESSOR) 25 MG tablet Take 0.5 tablets (12.5 mg total) by mouth 2 (two) times daily. 10/07/21 11/06/21  Hilty, Nadean Corwin, MD  predniSONE (DELTASONE) 10 MG tablet 3 tabs by mouth per day for 3 days,2tabs per day for 3 days,1tab per day for 3 days 01/20/22   Biagio Borg, MD  sucralfate (CARAFATE) 1 g tablet Take 1 tablet (1 g total) by mouth 4 (four) times daily -  with meals and at bedtime. Patient taking differently: Take 1 g by mouth 2 (two) times daily. 09/20/21 12/19/21  Ladene Artist, MD  traMADol (ULTRAM) 50 MG tablet Take 1 tablet (50 mg total) by mouth every 6 (six) hours as needed. 01/20/22   Biagio Borg, MD    Current Facility-Administered Medications  Medication Dose Route Frequency Provider Last Rate Last Admin   lactated ringers infusion   Intravenous Continuous Elgie Congo, MD 75 mL/hr at 04/16/22 0851 New Bag at 04/16/22 0851    Allergies as of 04/15/2022 - Review Complete 04/15/2022  Allergen Reaction Noted   Aspirin Other (See Comments) 09/01/2021   Sulfa antibiotics  04/08/2021    Family History  Problem Relation Age of Onset   Heart disease Mother    Valvular heart disease Mother    Healthy Sister    Alcoholism Half-Sister     Social History   Socioeconomic History   Marital status: Married    Spouse name: Not on file   Number of children: 0   Years of education: Not on file   Highest education level: Not on file  Occupational History   Not on file  Tobacco Use   Smoking status: Former    Years: 15.00    Types: Cigarettes   Smokeless tobacco: Never  Vaping Use   Vaping Use: Never used  Substance and Sexual Activity   Alcohol use: Not Currently   Drug use: Never   Sexual activity: Yes    Partners: Male  Other Topics Concern   Not on file  Social History Narrative    Not on file   Social Determinants of Health   Financial Resource Strain: Not on file  Food Insecurity: Not on file  Transportation Needs: Not on file  Physical Activity: Not on file  Stress: Not on file  Social Connections: Not on file  Intimate Partner Violence: Not on file    Physical Exam: Vital signs in last 24 hours: Temp:  [97.9 F (36.6 C)-98.8 F (37.1 C)] 97.9 F (36.6 C) (08/20 1526) Pulse Rate:  [68-111] 73 (08/20 1526) Resp:  [14-22] 18 (08/20 1526) BP: (94-137)/(47-80) 108/57 (08/20 1526) SpO2:  [94 %-100 %] 99 % (08/20 1526) Weight:  [77.1 kg] 77.1 kg (08/19 1556) Last BM Date : 04/16/22  Physical Exam: General:   Alert,  well-nourished, pleasant and cooperative in NAD. No pallor. Head:  Normocephalic and atraumatic. No alopecia.  Eyes:  Sclera clear, no icterus.  Conjunctiva pink. Ears:  Normal auditory acuity. Nose:  No deformity, discharge,  or lesions. Mouth:  No deformity or lesions.  No atrophic glossitis. No cheilosis. Neck:  Supple; no masses or thyromegaly. Lungs:  Clear throughout to auscultation.   No wheezes. Heart:  Regular rate and rhythm; no murmurs. Abdomen:  Soft, nontender, nondistended, normal bowel sounds, no rebound or guarding. No hepatosplenomegaly.   Rectal:  Deferred  Msk:  Symmetrical. No boney deformities LAD: No inguinal or umbilical LAD Extremities:  No clubbing or edema. Neurologic:  Alert and  oriented x4;  grossly nonfocal Skin:  No rash or bruise. No koilonychia.  Psych:  Alert and cooperative. Normal mood and affect.   Intake/Output from previous day: No intake/output data recorded. Intake/Output this shift: No intake/output data recorded.  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 On hydroxyurea.   Suspected recurrent AVM bleeding.  Discussed evaluation strategies with the patient. She would like to proceed with enteroscopy and colonoscopy with possible APC therapy if additional AVMs are identified tomorrow. She expressed concerns about a capsule endoscopy in the future due to risk for retention.   PLAN: - Push enteroscopy and colonoscopy 04/17/22 with Dr. Hilarie Fredrickson - Serial hgb/hct with transfusion as indicated in the meantime - Iron studies to determine need to resume IV iron - Consider trial of colestipol 1g with meals to minimize diarrhea  Thornton Park, MD, MPH [facility]

## 2022-04-17 ENCOUNTER — Encounter (HOSPITAL_COMMUNITY)
Admission: EM | Disposition: A | Discharge status: Home / Self Care | Payer: Self-pay | Source: Home / Self Care | Attending: Family Medicine

## 2022-04-17 ENCOUNTER — Encounter (HOSPITAL_COMMUNITY): Payer: Self-pay

## 2022-04-17 ENCOUNTER — Observation Stay (HOSPITAL_COMMUNITY): Payer: No Typology Code available for payment source | Admitting: Certified Registered"

## 2022-04-17 DIAGNOSIS — Z9049 Acquired absence of other specified parts of digestive tract: Secondary | ICD-10-CM | POA: Diagnosis not present

## 2022-04-17 DIAGNOSIS — K219 Gastro-esophageal reflux disease without esophagitis: Secondary | ICD-10-CM | POA: Diagnosis present

## 2022-04-17 DIAGNOSIS — D473 Essential (hemorrhagic) thrombocythemia: Secondary | ICD-10-CM | POA: Diagnosis present

## 2022-04-17 DIAGNOSIS — K31811 Angiodysplasia of stomach and duodenum with bleeding: Secondary | ICD-10-CM

## 2022-04-17 DIAGNOSIS — K922 Gastrointestinal hemorrhage, unspecified: Secondary | ICD-10-CM

## 2022-04-17 DIAGNOSIS — D509 Iron deficiency anemia, unspecified: Secondary | ICD-10-CM

## 2022-04-17 DIAGNOSIS — D1339 Benign neoplasm of other parts of small intestine: Secondary | ICD-10-CM

## 2022-04-17 DIAGNOSIS — K3189 Other diseases of stomach and duodenum: Secondary | ICD-10-CM | POA: Diagnosis present

## 2022-04-17 DIAGNOSIS — K648 Other hemorrhoids: Secondary | ICD-10-CM

## 2022-04-17 DIAGNOSIS — Z79899 Other long term (current) drug therapy: Secondary | ICD-10-CM | POA: Diagnosis not present

## 2022-04-17 DIAGNOSIS — I341 Nonrheumatic mitral (valve) prolapse: Secondary | ICD-10-CM

## 2022-04-17 DIAGNOSIS — R079 Chest pain, unspecified: Secondary | ICD-10-CM | POA: Diagnosis present

## 2022-04-17 DIAGNOSIS — D62 Acute posthemorrhagic anemia: Secondary | ICD-10-CM | POA: Diagnosis present

## 2022-04-17 DIAGNOSIS — D500 Iron deficiency anemia secondary to blood loss (chronic): Secondary | ICD-10-CM | POA: Diagnosis not present

## 2022-04-17 DIAGNOSIS — D125 Benign neoplasm of sigmoid colon: Secondary | ICD-10-CM

## 2022-04-17 DIAGNOSIS — K6389 Other specified diseases of intestine: Secondary | ICD-10-CM

## 2022-04-17 DIAGNOSIS — R195 Other fecal abnormalities: Secondary | ICD-10-CM

## 2022-04-17 DIAGNOSIS — R002 Palpitations: Secondary | ICD-10-CM | POA: Diagnosis present

## 2022-04-17 DIAGNOSIS — K635 Polyp of colon: Secondary | ICD-10-CM

## 2022-04-17 DIAGNOSIS — R739 Hyperglycemia, unspecified: Secondary | ICD-10-CM | POA: Diagnosis present

## 2022-04-17 DIAGNOSIS — Z886 Allergy status to analgesic agent status: Secondary | ICD-10-CM | POA: Diagnosis not present

## 2022-04-17 DIAGNOSIS — Q2739 Arteriovenous malformation, other site: Secondary | ICD-10-CM | POA: Diagnosis not present

## 2022-04-17 DIAGNOSIS — K5520 Angiodysplasia of colon without hemorrhage: Secondary | ICD-10-CM | POA: Diagnosis present

## 2022-04-17 DIAGNOSIS — K5521 Angiodysplasia of colon with hemorrhage: Secondary | ICD-10-CM

## 2022-04-17 DIAGNOSIS — Z882 Allergy status to sulfonamides status: Secondary | ICD-10-CM | POA: Diagnosis not present

## 2022-04-17 DIAGNOSIS — K921 Melena: Secondary | ICD-10-CM | POA: Diagnosis not present

## 2022-04-17 DIAGNOSIS — K31819 Angiodysplasia of stomach and duodenum without bleeding: Secondary | ICD-10-CM

## 2022-04-17 DIAGNOSIS — Z87891 Personal history of nicotine dependence: Secondary | ICD-10-CM | POA: Diagnosis not present

## 2022-04-17 DIAGNOSIS — D126 Benign neoplasm of colon, unspecified: Secondary | ICD-10-CM

## 2022-04-17 HISTORY — PX: HOT HEMOSTASIS: SHX5433

## 2022-04-17 HISTORY — PX: COLONOSCOPY: SHX5424

## 2022-04-17 HISTORY — PX: POLYPECTOMY: SHX5525

## 2022-04-17 HISTORY — PX: HEMOSTASIS CLIP PLACEMENT: SHX6857

## 2022-04-17 HISTORY — PX: ENTEROSCOPY: SHX5533

## 2022-04-17 LAB — BASIC METABOLIC PANEL
Anion gap: 8 (ref 5–15)
BUN: 12 mg/dL (ref 8–23)
CO2: 26 mmol/L (ref 22–32)
Calcium: 9 mg/dL (ref 8.9–10.3)
Chloride: 108 mmol/L (ref 98–111)
Creatinine, Ser: 0.68 mg/dL (ref 0.44–1.00)
GFR, Estimated: >60 mL/min (ref >60)
Glucose, Bld: 107 mg/dL — ABNORMAL HIGH (ref 70–99)
Potassium: 3.6 mmol/L (ref 3.5–5.1)
Sodium: 142 mmol/L (ref 135–145)

## 2022-04-17 LAB — IRON AND TIBC
Iron: 15 ug/dL — ABNORMAL LOW (ref 28–170)
Saturation Ratios: 4 % — ABNORMAL LOW (ref 10.4–31.8)
TIBC: 363 ug/dL (ref 250–450)
UIBC: 348 ug/dL

## 2022-04-17 LAB — CBC
HCT: 29.8 % — ABNORMAL LOW (ref 36.0–46.0)
Hemoglobin: 9.3 g/dL — ABNORMAL LOW (ref 12.0–15.0)
MCH: 36.9 pg — ABNORMAL HIGH (ref 26.0–34.0)
MCHC: 31.2 g/dL (ref 30.0–36.0)
MCV: 118.3 fL — ABNORMAL HIGH (ref 80.0–100.0)
Platelets: 180 10*3/uL (ref 150–400)
RBC: 2.52 MIL/uL — ABNORMAL LOW (ref 3.87–5.11)
RDW: 12.5 % (ref 11.5–15.5)
WBC: 5.7 10*3/uL (ref 4.0–10.5)
nRBC: 0 % (ref 0.0–0.2)

## 2022-04-17 LAB — FERRITIN: Ferritin: 4 ng/mL — ABNORMAL LOW (ref 11–307)

## 2022-04-17 SURGERY — ENTEROSCOPY
Anesthesia: Monitor Anesthesia Care

## 2022-04-17 MED ORDER — PHENYLEPHRINE HCL (PRESSORS) 10 MG/ML IV SOLN
INTRAVENOUS | Status: DC | PRN
  Administered 2022-04-17: 100 ug via INTRAVENOUS

## 2022-04-17 MED ORDER — SODIUM CHLORIDE 0.9 % IV SOLN
250.0000 mg | Freq: Once | INTRAVENOUS | Status: AC
  Administered 2022-04-17: 250 mg via INTRAVENOUS
  Filled 2022-04-17: qty 20

## 2022-04-17 MED ORDER — PANTOPRAZOLE SODIUM 40 MG PO TBEC
40.0000 mg | DELAYED_RELEASE_TABLET | Freq: Every day | ORAL | Status: DC
  Administered 2022-04-18 – 2022-04-19 (×2): 40 mg via ORAL
  Filled 2022-04-17 (×2): qty 1

## 2022-04-17 MED ORDER — GLUCAGON HCL RDNA (DIAGNOSTIC) 1 MG IJ SOLR
INTRAMUSCULAR | Status: AC
  Filled 2022-04-17: qty 1

## 2022-04-17 MED ORDER — MORPHINE SULFATE (PF) 2 MG/ML IV SOLN
2.0000 mg | INTRAVENOUS | Status: DC | PRN
  Administered 2022-04-17: 2 mg via INTRAVENOUS
  Filled 2022-04-17: qty 1

## 2022-04-17 MED ORDER — PHENYLEPHRINE HCL-NACL 20-0.9 MG/250ML-% IV SOLN
INTRAVENOUS | Status: DC | PRN
  Administered 2022-04-17: 20 ug/min via INTRAVENOUS

## 2022-04-17 MED ORDER — CALCIUM GLUCONATE 10 % IV SOLN
INTRAVENOUS | Status: DC | PRN
  Administered 2022-04-17: .5 mg via INTRAVENOUS

## 2022-04-17 MED ORDER — PROPOFOL 500 MG/50ML IV EMUL
INTRAVENOUS | Status: DC | PRN
  Administered 2022-04-17: 100 ug/kg/min via INTRAVENOUS

## 2022-04-17 MED ORDER — LIDOCAINE 2% (20 MG/ML) 5 ML SYRINGE
INTRAMUSCULAR | Status: DC | PRN
  Administered 2022-04-17: 60 mg via INTRAVENOUS

## 2022-04-17 MED ORDER — PROPOFOL 10 MG/ML IV BOLUS
INTRAVENOUS | Status: DC | PRN
  Administered 2022-04-17: 40 mg via INTRAVENOUS

## 2022-04-17 NOTE — Procedures (Addendum)
I spoke to the patient's husband by phone after procedure regarding findings and plan. Time provided for questions and answers and he thanked me for the call

## 2022-04-17 NOTE — Progress Notes (Signed)
PROGRESS NOTE    Kristina Osborne  NFA:[id number] DOB: 1950-09-27 DOA: 04/15/2022 PCP: Biagio Borg, MD   Brief Narrative:  Kristina Osborne is a 71 y.o. female with medical history significant of essential thrombosis, JAK2 mutation, mitral valve prolapse, GERD and AVM of small intestine presented to ED with the complaint of shortness of breath.  Per patient, she usually walks about 1.5 miles daily but today she felt very short of breath.  She had to take rest and even after taking an hour of rest, she still continued to have shortness of breath along with some chest pain and palpitation. she has possibly noticed 1 episode of melena but she could not be sure.   She further tells me that she moved here from [place] in last August.  Prior to that, she was in [place] and she was diagnosed with JAK2 mutation about 2 years ago and received iron infusion twice a month until she moved here and then she established care with Dr. Lorenso Courier who told her that she does not need any IV iron anymore and according to her, she has been fine ever since.   Upon arrival to ED, she was hemodynamically stable.  Hemoglobin 9.9 which was 11.2 about a month ago and 13.35 months ago.  Admitted under hospitalist service, GI consulted.  Assessment & Plan:   Principal Problem:   GI bleeding Active Problems:   Essential thrombocytosis (HCC)   AVM (arteriovenous malformation) of small bowel, acquired   MVP (mitral valve prolapse)   Gastroesophageal reflux disease without esophagitis   Palpitations   ABLA (acute blood loss anemia)   Symptomatic anemia  Symptomatic anemia/acute blood loss anemia/possible upper GI bleed: Prior history of intestinal AVM.  She was FOBT positive in the ED.  We will start her on twice daily Protonix.  GI consulted.  No further melena.  Hemoglobin has remained stable.  GI has seen her.  Plan for push enteroscopy and colonoscopy today.  Iron studies show severe iron deficiency.  We will give  her IV iron after the procedure.   Essential thrombosis/JAK2 mutation: Continue hydroxyurea.   Chest pain: Could be due to anemia or musculoskeletal.  ACS ruled out.   Hyperglycemia: Slightly hyperglycemic upon presentation.  Blood sugar better now.  Hemoglobin A1c only 4.4.  DVT prophylaxis: SCDs Start: 04/16/22 1606   Code Status: Full Code  Family Communication: Husband present at bedside.  Plan of care discussed with patient in length and he/she verbalized understanding and agreed with it.  Status is: Observation The patient will require care spanning > 2 midnights and should be moved to inpatient because: Scheduled for enteroscopy and colonoscopy today.   Estimated body mass index is 27.44 kg/m as calculated from the following:   Height as of this encounter: '5\' 6"'$  (1.676 m).   Weight as of this encounter: 77.1 kg.    Nutritional Assessment: Body mass index is 27.44 kg/m.Marland Kitchen Seen by dietician.  I agree with the assessment and plan as outlined below: Nutrition Status:        . Skin Assessment: I have examined the patient's skin and I agree with the wound assessment as performed by the wound care RN as outlined below:    Consultants:  GI  Procedures:  None  Antimicrobials:  Anti-infectives (From admission, onward)    None         Subjective: Seen and examined.  No new complaint.  Husband at the bedside.  She is looking forward to  the procedure however she has not finished her bowel prep.  And she feels nauseous.  I encouraged her to make sure that she finishes her bowel prep so the colonoscopy is successful.  She understands that.  Objective: Vitals:   04/16/22 1445 04/16/22 1526 04/16/22 1942 04/17/22 0323  BP: 98/65 (!) 108/57 109/66 (!) 94/48  Pulse: 73 73 74 66  Resp: '16 18 20 16  '$ Temp:  97.9 F (36.6 C) 98.5 F (36.9 C) 97.9 F (36.6 C)  TempSrc:  Oral    SpO2: 100% 99% 98% 100%  Weight:      Height:        Intake/Output Summary (Last 24  hours) at 04/17/2022 0811 Last data filed at 04/16/2022 1800 Gross per 24 hour  Intake 686.25 ml  Output --  Net 686.25 ml   Filed Weights   04/15/22 1556  Weight: 77.1 kg    Examination:  General exam: Appears calm and comfortable  Respiratory system: Clear to auscultation. Respiratory effort normal. Cardiovascular system: S1 & S2 heard, RRR. No JVD, murmurs, rubs, gallops or clicks. No pedal edema. Gastrointestinal system: Abdomen is nondistended, soft and nontender. No organomegaly or masses felt. Normal bowel sounds heard. Central nervous system: Alert and oriented. No focal neurological deficits. Extremities: Symmetric 5 x 5 power. Skin: No rashes, lesions or ulcers.  Psychiatry: Judgement and insight appear normal. Mood & affect appropriate.   Data Reviewed: I have personally reviewed following labs and imaging studies  CBC: Recent Labs  Lab 04/15/22 1557 04/16/22 0701 04/17/22 0355  WBC 8.6  --  5.7  HGB 9.9* 9.2* 9.3*  HCT 31.4* 28.5* 29.8*  MCV 115.4*  --  118.3*  PLT 212  --  762   Basic Metabolic Panel: Recent Labs  Lab 04/15/22 1557 04/16/22 1622 04/17/22 0355  NA 138  --  142  K 3.8  --  3.6  CL 100  --  108  CO2 24  --  26  GLUCOSE 170*  --  107*  BUN 16  --  12  CREATININE 0.96  --  0.68  CALCIUM 9.7  --  9.0  MG  --  2.1  --    GFR: Estimated Creatinine Clearance: 68.6 mL/min (by C-G formula based on SCr of 0.68 mg/dL). Liver Function Tests: No results for input(s): "AST", "ALT", "ALKPHOS", "BILITOT", "PROT", "ALBUMIN" in the last 168 hours. No results for input(s): "LIPASE", "AMYLASE" in the last 168 hours. No results for input(s): "AMMONIA" in the last 168 hours. Coagulation Profile: No results for input(s): "INR", "PROTIME" in the last 168 hours. Cardiac Enzymes: No results for input(s): "CKTOTAL", "CKMB", "CKMBINDEX", "TROPONINI" in the last 168 hours. BNP (last 3 results) No results for input(s): "PROBNP" in the last 8760  hours. HbA1C: Recent Labs    04/16/22 1623  HGBA1C 4.4*   CBG: No results for input(s): "GLUCAP" in the last 168 hours. Lipid Profile: No results for input(s): "CHOL", "HDL", "LDLCALC", "TRIG", "CHOLHDL", "LDLDIRECT" in the last 72 hours. Thyroid Function Tests: Recent Labs    04/16/22 1622  TSH 2.216   Anemia Panel: Recent Labs    04/16/22 0023 04/17/22 0355  FERRITIN  --  4*  TIBC 407 363  IRON 33 15*   Sepsis Labs: No results for input(s): "PROCALCITON", "LATICACIDVEN" in the last 168 hours.  No results found for this or any previous visit (from the past 240 hour(s)).   Radiology Studies: DG Chest 2 View  Result Date: 04/15/2022 CLINICAL  DATA:  Shortness of breath EXAM: CHEST - 2 VIEW COMPARISON:  10/05/2021 FINDINGS: Prior median sternotomy. Mitral valve annular calcifications. Heart is normal size. Lungs clear. No effusions or edema. No acute bony abnormality. IMPRESSION: No active cardiopulmonary disease. Electronically Signed   By: Rolm Baptise M.D.   On: 04/15/2022 17:06    Scheduled Meds:  folic acid  1 mg Oral Daily   hydroxyurea  500 mg Oral BID   metoprolol tartrate  12.5 mg Oral BID   pantoprazole (PROTONIX) IV  40 mg Intravenous Q12H   Continuous Infusions:  lactated ringers 75 mL/hr at 04/17/22 0730     LOS: 0 days   Darliss Cheney, MD [facility]  04/17/2022, 8:11 AM   *Please note that this is a verbal dictation therefore any spelling or grammatical errors are due to the "[place] [employer]" system interpretation.  Please page via [place] and do not message via secure chat for urgent patient care matters. Secure chat can be used for non urgent patient care matters.  How to contact the [facility] Attending or Consulting provider [place] or covering provider during after hours [place], for this patient?  Check the care team in [facility] and look for a) attending/consulting [facility] provider listed and b) the [facility] team listed. Page or secure chat [phone]. Log into  [email or web address] and use [place]'s universal password to access. If you do not have the password, please contact the hospital operator. Locate the [facility] provider you are looking for under [facility] and page to a number that you can be directly reached. If you still have difficulty reaching the provider, please page the [facility] (Director on Call) for the Hospitalists listed on amion for assistance.

## 2022-04-17 NOTE — Op Note (Signed)
[facility] Patient Name: Kristina Osborne Procedure Date: 04/17/2022 MRN: [id number] Attending MD: Jerene Bears , MD Date of Birth: 08-Aug-1951 CSN: [id number] Age: 71 Admit Type: Inpatient Procedure:                Colonoscopy Indications:              Heme positive stool, history of arteriovenous                            malformation in the small and large intestine, Iron                            deficiency anemia secondary to chronic blood loss Providers:                Lajuan Lines. Hilarie Fredrickson, MD, Jaci Carrel, RN, Fransico Setters                            [place], Technician Referring MD:             [facility] Medicines:                Monitored Anesthesia Care Complications:            No immediate complications. Estimated Blood Loss:     Estimated blood loss was minimal. Procedure:                Pre-Anesthesia Assessment:                           - Prior to the procedure, a History and Physical                            was performed, and patient medications and                            allergies were reviewed. The patient's tolerance of                            previous anesthesia was also reviewed. The risks                            and benefits of the procedure and the sedation                            options and risks were discussed with the patient.                            All questions were answered, and informed consent                            was obtained. Prior Anticoagulants: The patient has                            taken no previous anticoagulant or antiplatelet  agents. ASA Grade Assessment: II - A patient with                            mild systemic disease. After reviewing the risks                            and benefits, the patient was deemed in                            satisfactory condition to undergo the procedure.                           After obtaining informed consent, the  colonoscope                            was passed under direct vision. Throughout the                            procedure, the patient's blood pressure, pulse, and                            oxygen saturations were monitored continuously. The                            [id number]-HQ190L ([id number]) Olympus colonoscope was                            introduced through the anus and advanced to the                            terminal ileum. The colonoscopy was performed                            without difficulty. The patient tolerated the                            procedure well. The quality of the bowel                            preparation was good. The terminal ileum, ileocecal                            valve, appendiceal orifice, and rectum were                            photographed. Scope In: 1:54:26 PM Scope Out: 2:16:18 PM Scope Withdrawal Time: 0 hours 19 minutes 24 seconds  Total Procedure Duration: 0 hours 21 minutes 52 seconds  Findings:      The digital rectal exam was normal.      There was evidence of a prior end-to-side ileo-colonic anastomosis in       the ascending colon. This was patent and was characterized by healthy       appearing mucosa.      The neo-terminal ileum appeared normal.  Many telangiectatic superficial blood vessels seen in the descending       colon, in the transverse colon and in the ascending colon. None active       bleeding and without the tight arborization of a typical angioectasias.       Not amenable to APC given diffuse lacy appearance in large patches.      A 6 mm polyp was found in the proximal sigmoid colon. The polyp was       semi-pedunculated. The polyp was removed with a hot snare. Resection and       retrieval were complete.      Internal hemorrhoids were found during retroflexion. The hemorrhoids       were small. Impression:               - Patent end-to-side ileo-colonic anastomosis,                            characterized by  healthy appearing mucosa.                           - The examined portion of the ileum was normal.                           - Patches of telangiectasias (telangiectatic                            vessels) without bleeding scattered in the                            descending colon, in the transverse colon and in                            the ascending colon.                           - One 6 mm polyp in the proximal sigmoid colon,                            removed with a hot snare. Resected and retrieved.                           - Internal hemorrhoids. Moderate Sedation:      N/A Recommendation:           - Return patient to hospital ward for ongoing care.                           - Resume previous diet.                           - Continue present medications.                           - Replace iron IV which is expected to be needed                            over time. Closely monitor Hgb.                           -  Await pathology results.                           - See the other procedure note for documentation of                            additional recommendations. Procedure Code(s):        --- Professional ---                           [phone], Colonoscopy, flexible; with removal of                            tumor(s), polyp(s), or other lesion(s) by snare                            technique Diagnosis Code(s):        --- Professional ---                           Z98.0, Intestinal bypass and anastomosis status                           K64.8, Other hemorrhoids                           K63.5, Polyp of colon                           R19.5, Other fecal abnormalities                           K31.819, Angiodysplasia of stomach and duodenum                            without bleeding                           K55.20, Angiodysplasia of colon without hemorrhage                           D50.0, Iron deficiency anemia secondary to blood                            loss  (chronic) CPT copyright 2019 American Medical Association. All rights reserved. The codes documented in this report are preliminary and upon coder review may  be revised to meet current compliance requirements. Jerene Bears, MD 04/17/2022 2:34:41 PM This report has been signed electronically. Number of Addenda: 0

## 2022-04-17 NOTE — Interval H&P Note (Signed)
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 The nature of the procedure, as well as the risks, benefits, and alternatives were carefully and thoroughly reviewed with the patient. Ample time for discussion and questions allowed. The patient understood, was satisfied, and agreed to proceed.      Latest Ref Rng & Units 04/17/2022    3:55 AM 04/16/2022    7:01 AM 04/15/2022    3:57 PM  CBC  WBC 4.0 - 10.5 K/uL 5.7   8.6   Hemoglobin 12.0 - 15.0 g/dL 9.3  9.2  9.9   Hematocrit 36.0 - 46.0 % 29.8  28.5  31.4   Platelets 150 - 400 K/uL 180   212      04/17/2022 1:19 PM  [name]  has presented today for surgery, with the diagnosis of anemia, heme positive stool.  The various methods of treatment have been discussed with the patient and family. After consideration of risks, benefits and other options for treatment, the patient has consented to  Procedure(s): ENTEROSCOPY (N/A) COLONOSCOPY (N/A) as a surgical intervention.  The patient's history has been reviewed, patient examined, no change in status, stable for surgery.  I have reviewed the patient's chart and labs.  Questions were answered to the patient's satisfaction.     Kristina Osborne Kristina Osborne

## 2022-04-17 NOTE — Op Note (Signed)
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 Hilarie Fredrickson, MD, Jaci Carrel, RN, Fransico Setters                            Mbumina, Technician Referring MD:             [facility] Medicines:                Monitored Anesthesia Care Complications:            No immediate complications. Estimated Blood Loss:     Estimated blood loss: none. Procedure:                Pre-Anesthesia Assessment:                           - Prior to the procedure, a History and Physical                            was performed, and patient medications and                            allergies were reviewed. The patient's tolerance of                            previous anesthesia was also reviewed. The risks                            and benefits of the procedure and the sedation                            options and risks were discussed with the patient.                            All questions were answered, and informed consent                            was obtained. Prior Anticoagulants: The patient has                            taken no previous anticoagulant or antiplatelet  agents. ASA Grade Assessment: II - A patient with                            mild systemic disease. After reviewing the risks                            and benefits, the patient was deemed in                            satisfactory condition to undergo the procedure.                           After obtaining informed consent, the endoscope was                             passed under direct vision. Throughout the                            procedure, the patient's blood pressure, pulse, and                            oxygen saturations were monitored continuously. The                            PCF-H190TL ([id number] Olympus slim colonoscope was                            introduced through the mouth and advanced to the                            proximal jejunum. The small bowel enteroscopy was                            accomplished without difficulty. The patient                            tolerated the procedure well. Scope In: Scope Out: Findings:      The examined esophagus was normal.      The entire examined stomach was normal.      Erythematous mucosa without active bleeding and with no stigmata of       bleeding was found in the duodenal bulb.      Two angioectasias with stigmata of recent bleeding (adjacent fresh       blood) were found in the proximal jejunum. Fulguration to stop the       bleeding by argon plasma at 0.5 liters/minute and 20 watts was       successful.      A 5 mm pedunculated polyp with was found in the proximal jejunum. The       polyp was removed with a hot snare. Resection and retrieval were       complete. To prevent bleeding after the polypectomy, one hemostatic clip       was successfully placed (MR conditional). There was no bleeding at the       end of the maneuver. Impression:               -  Normal esophagus.                           - Normal stomach.                           - Erythematous duodenopathy in bulb without                            bleeding.                           - Two recently bleeding angioectasias in the                            jejunum. Treated with argon plasma coagulation                            (APC).                           - Jejunal polyp. Resected and retrieved. Clip (MR                            conditional) was placed. Moderate Sedation:       N/A Recommendation:           - Return patient to hospital ward for ongoing care.                           - Continue present medications.                           - Await pathology results.                           - Advance diet as tolerated.                           - See other procedure note.                           - Replace iron IV. Monitor for treatment response. Procedure Code(s):        --- Professional ---                           [phone], 51, Small intestinal endoscopy, enteroscopy                            beyond second portion of duodenum, not including                            ileum; with control of bleeding (eg, injection,                            bipolar cautery, unipolar cautery, laser, heater  probe, stapler, plasma coagulator)                           [phone], Small intestinal endoscopy, enteroscopy                            beyond second portion of duodenum, not including                            ileum; with removal of tumor(s), polyp(s), or other                            lesion(s) by snare technique Diagnosis Code(s):        --- Professional ---                           K31.89, Other diseases of stomach and duodenum                           K55.21, Angiodysplasia of colon with hemorrhage                           D13.39, Benign neoplasm of other parts of small                            intestine                           D50.0, Iron deficiency anemia secondary to blood                            loss (chronic)                           K31.819, Angiodysplasia of stomach and duodenum                            without bleeding CPT copyright 2019 [employer]. All rights reserved. The codes documented in this report are preliminary and upon coder review may  be revised to meet current compliance requirements. Jerene Bears, MD 04/17/2022 2:26:56 PM This report has been signed electronically. Number of Addenda:  0

## 2022-04-17 NOTE — Anesthesia Postprocedure Evaluation (Signed)
Anesthesia Post Note  Patient: Kristina Osborne  Procedure(s) Performed: ENTEROSCOPY COLONOSCOPY HOT HEMOSTASIS (ARGON PLASMA COAGULATION/BICAP) POLYPECTOMY HEMOSTASIS CLIP PLACEMENT     Patient location during evaluation: Endoscopy Anesthesia Type: MAC Level of consciousness: oriented, awake and alert and awake Pain management: pain level controlled Vital Signs Assessment: post-procedure vital signs reviewed and stable Respiratory status: spontaneous breathing, nonlabored ventilation, respiratory function stable and patient connected to nasal cannula oxygen Cardiovascular status: blood pressure returned to baseline and stable Postop Assessment: no headache, no backache and no apparent nausea or vomiting Anesthetic complications: no   No notable events documented.  Last Vitals:  Vitals:   04/17/22 1440 04/17/22 1445  BP: (!) 97/42 (!) 98/40  Pulse: 75 79  Resp: 20 20  Temp:    SpO2: 96% 92%    Last Pain:  Vitals:   04/17/22 1440  TempSrc:   PainSc: 0-No pain                 Santa Lighter

## 2022-04-17 NOTE — Transfer of Care (Signed)
Immediate Anesthesia Transfer of Care Note  Patient: Kristina Osborne  Procedure(s) Performed: ENTEROSCOPY COLONOSCOPY HOT HEMOSTASIS (ARGON PLASMA COAGULATION/BICAP) POLYPECTOMY HEMOSTASIS CLIP PLACEMENT  Patient Location: PACU  Anesthesia Type:MAC  Level of Consciousness: awake, alert , oriented and patient cooperative  Airway & Oxygen Therapy: Patient Spontanous Breathing and Patient connected to face mask oxygen  Post-op Assessment: Report given to RN and Post -op Vital signs reviewed and stable  Post vital signs: Reviewed and stable  Last Vitals:  Vitals Value Taken Time  BP 86/36 04/17/22 1426  Temp    Pulse 70 04/17/22 1427  Resp 22 04/17/22 1427  SpO2 100 % 04/17/22 1427  Vitals shown include unvalidated device data.  Last Pain:  Vitals:   04/17/22 1243  TempSrc: Temporal  PainSc: 0-No pain      Patients Stated Pain Goal: 0 (09/38/18 2993)  Complications: No notable events documented.

## 2022-04-17 NOTE — Anesthesia Preprocedure Evaluation (Signed)
Anesthesia Evaluation  Patient identified by MRN, date of birth, ID band Patient awake    Reviewed: Allergy & Precautions, NPO status , Patient's Chart, lab work & pertinent test results  Airway Mallampati: II  TM Distance: >3 FB Neck ROM: Full    Dental  (+) Teeth Intact, Dental Advisory Given, Caps   Pulmonary former smoker,    Pulmonary exam normal breath sounds clear to auscultation       Cardiovascular Normal cardiovascular exam+ Valvular Problems/Murmurs MVP  Rhythm:Regular Rate:Normal     Neuro/Psych  Neuromuscular disease    GI/Hepatic Neg liver ROS, GERD  Medicated,  Endo/Other  negative endocrine ROS  Renal/GU negative Renal ROS     Musculoskeletal negative musculoskeletal ROS (+)   Abdominal   Peds  Hematology  (+) Blood dyscrasia, anemia ,   Anesthesia Other Findings Day of surgery medications reviewed with the patient.  Reproductive/Obstetrics                             Anesthesia Physical Anesthesia Plan  ASA: 2  Anesthesia Plan: MAC   Post-op Pain Management: Minimal or no pain anticipated   Induction: Intravenous  PONV Risk Score and Plan: 2 and Treatment may vary due to age or medical condition and TIVA  Airway Management Planned: Nasal Cannula and Natural Airway  Additional Equipment:   Intra-op Plan:   Post-operative Plan:   Informed Consent: I have reviewed the patients History and Physical, chart, labs and discussed the procedure including the risks, benefits and alternatives for the proposed anesthesia with the patient or authorized representative who has indicated his/her understanding and acceptance.     Dental advisory given  Plan Discussed with: CRNA and Anesthesiologist  Anesthesia Plan Comments:         Anesthesia Quick Evaluation

## 2022-04-17 NOTE — Progress Notes (Signed)
Pt leaving off the unit at this time to go to procedure in Endo. Stable at time of departure.

## 2022-04-17 NOTE — Progress Notes (Signed)
Mobility Specialist - Progress Note   04/17/22 0924  Mobility  Activity Refused mobility   Pt refused mobility, claimed she was IND and didn't need assistance or someone to walk with her.   Roderick Pee Mobility Specialist

## 2022-04-18 ENCOUNTER — Encounter: Payer: Self-pay | Admitting: Internal Medicine

## 2022-04-18 DIAGNOSIS — K922 Gastrointestinal hemorrhage, unspecified: Secondary | ICD-10-CM | POA: Diagnosis not present

## 2022-04-18 LAB — CBC WITH DIFFERENTIAL/PLATELET
Abs Immature Granulocytes: 0.02 10*3/uL (ref 0.00–0.07)
Abs Immature Granulocytes: 0.02 10*3/uL (ref 0.00–0.07)
Basophils Absolute: 0 10*3/uL (ref 0.0–0.1)
Basophils Absolute: 0 10*3/uL (ref 0.0–0.1)
Basophils Relative: 0 %
Basophils Relative: 1 %
Eosinophils Absolute: 0 10*3/uL (ref 0.0–0.5)
Eosinophils Absolute: 0 10*3/uL (ref 0.0–0.5)
Eosinophils Relative: 0 %
Eosinophils Relative: 0 %
HCT: 25.3 % — ABNORMAL LOW (ref 36.0–46.0)
HCT: 26.7 % — ABNORMAL LOW (ref 36.0–46.0)
Hemoglobin: 7.9 g/dL — ABNORMAL LOW (ref 12.0–15.0)
Hemoglobin: 8.3 g/dL — ABNORMAL LOW (ref 12.0–15.0)
Immature Granulocytes: 1 %
Immature Granulocytes: 1 %
Lymphocytes Relative: 15 %
Lymphocytes Relative: 13 %
Lymphs Abs: 0.6 10*3/uL — ABNORMAL LOW (ref 0.7–4.0)
Lymphs Abs: 0.6 10*3/uL — ABNORMAL LOW (ref 0.7–4.0)
MCH: 36.4 pg — ABNORMAL HIGH (ref 26.0–34.0)
MCH: 36.1 pg — ABNORMAL HIGH (ref 26.0–34.0)
MCHC: 31.2 g/dL (ref 30.0–36.0)
MCHC: 31.1 g/dL (ref 30.0–36.0)
MCV: 116.6 fL — ABNORMAL HIGH (ref 80.0–100.0)
MCV: 116.1 fL — ABNORMAL HIGH (ref 80.0–100.0)
Monocytes Absolute: 0.3 10*3/uL (ref 0.1–1.0)
Monocytes Absolute: 0.4 10*3/uL (ref 0.1–1.0)
Monocytes Relative: 7 %
Monocytes Relative: 9 %
Neutro Abs: 2.8 10*3/uL (ref 1.7–7.7)
Neutro Abs: 3.3 10*3/uL (ref 1.7–7.7)
Neutrophils Relative %: 77 %
Neutrophils Relative %: 76 %
Platelets: 172 10*3/uL (ref 150–400)
Platelets: 189 10*3/uL (ref 150–400)
RBC: 2.17 MIL/uL — ABNORMAL LOW (ref 3.87–5.11)
RBC: 2.3 MIL/uL — ABNORMAL LOW (ref 3.87–5.11)
RDW: 12.3 % (ref 11.5–15.5)
RDW: 12.5 % (ref 11.5–15.5)
WBC: 3.6 10*3/uL — ABNORMAL LOW (ref 4.0–10.5)
WBC: 4.3 10*3/uL (ref 4.0–10.5)
nRBC: 0 % (ref 0.0–0.2)
nRBC: 0 % (ref 0.0–0.2)

## 2022-04-18 LAB — SURGICAL PATHOLOGY

## 2022-04-18 NOTE — Progress Notes (Signed)
Daily Progress Note  Hospital Day: 4  Chief Complaint: anemia  Brief History Kristina Osborne is a 71 y.o. female with a pmh not limited to thrombocytosis, mitral valve prolapse, iron deficiency anemia, GERD, chronic diarrhea, and prior history of bleeding secondary to AVMs. Admitted with symptomatic anemia and FOBT+.    Assessment / Plan   # GI bleed / recurrent IDA secondary to two recently bleeding angioectasias in jejunum treated with APC Ferritin 4. Hgb has continued to drift, down from 9.3 >> 7.9 overnight.  She is dizzy when she stands.No SHOB.  She had a scant amount of blood in stool after procedures yesterday but no blood in stools since. Received two doses of IV iron yesterday. Hopefully hgb will begin to recover.   # Jejunal polyp and colon polyp, path pending  Subjective   Feels fine other than dizziness when she stands  She is happy that a source of bleeding was found.   Objective   Endoscopic studies:  04/17/22 small bowel endoscopy - Normal stomach. - Erythematous duodenopathy in bulb without bleeding. - Two recently bleeding angioectasias in the jejunum. Treated with argon plasma coagulation (APC). - Jejunal polyp. Resected and retrieved. Clip (MR conditional) was placed.  04/17/22 colonoscopy  - Patent end-to-side ileo-colonic anastomosis, characterized by healthy appearing mucosa. - The examined portion of the ileum was normal. - Patches of telangiectasias (telangiectatic vessels) without bleeding scattered in the descending colon, in the transverse colon and in the ascending colon. - One 6 mm polyp in the proximal sigmoid colon, removed with a hot snare. Resected and retrieved. - Internal hemorrhoids  Imaging:  DG Chest 2 View  Result Date: 04/15/2022 CLINICAL DATA:  Shortness of breath EXAM: CHEST - 2 VIEW COMPARISON:  10/05/2021 FINDINGS: Prior median sternotomy. Mitral valve annular calcifications. Heart is normal size. Lungs clear. No effusions  or edema. No acute bony abnormality. IMPRESSION: No active cardiopulmonary disease. Electronically Signed   By: Rolm Baptise M.D.   On: 04/15/2022 17:06    Lab Results: Recent Labs    04/15/22 1557 04/16/22 0701 04/17/22 0355 04/18/22 0411  WBC 8.6  --  5.7 3.6*  HGB 9.9* 9.2* 9.3* 7.9*  HCT 31.4* 28.5* 29.8* 25.3*  PLT 212  --  180 172   BMET Recent Labs    04/15/22 1557 04/17/22 0355  NA 138 142  K 3.8 3.6  CL 100 108  CO2 24 26  GLUCOSE 170* 107*  BUN 16 12  CREATININE 0.96 0.68  CALCIUM 9.7 9.0   LFT No results for input(s): "PROT", "ALBUMIN", "AST", "ALT", "ALKPHOS", "BILITOT", "BILIDIR", "IBILI" in the last 72 hours. PT/INR No results for input(s): "LABPROT", "INR" in the last 72 hours.   Scheduled inpatient medications:   folic acid  1 mg Oral Daily   metoprolol tartrate  12.5 mg Oral BID   pantoprazole  40 mg Oral Q0600   Continuous inpatient infusions:   lactated ringers 75 mL/hr at 04/17/22 2221   PRN inpatient medications: acetaminophen **OR** acetaminophen, methocarbamol, morphine injection, ondansetron **OR** ondansetron (ZOFRAN) IV, traMADol  Vital signs in last 24 hours: Temp:  [97.5 F (36.4 C)-98.8 F (37.1 C)] 98.8 F (37.1 C) (08/22 0519) Pulse Rate:  [60-84] 79 (08/22 0519) Resp:  [13-22] 17 (08/22 0519) BP: (85-108)/(35-58) 92/58 (08/22 0519) SpO2:  [92 %-100 %] 96 % (08/22 0519) Weight:  [72.6 kg] 72.6 kg (08/21 1243) Last BM Date : 04/17/22  Intake/Output Summary (Last 24 hours) at 04/18/2022 4034  Last data filed at 04/17/2022 1420 Gross per 24 hour  Intake 800 ml  Output --  Net 800 ml    Intake/Output from previous day: 08/21 0701 - 08/22 0700 In: 800 [I.V.:800] Out: -  Intake/Output this shift: No intake/output data recorded.   Physical Exam:  General: Alert female in NAD Heart:  Regular rate and rhythm. No lower extremity edema Pulmonary: Normal respiratory effort Abdomen: Soft, nondistended, nontender. Normal bowel  sounds.  Neurologic: Alert and oriented Psych: Pleasant. Cooperative.    Principal Problem:   GI bleeding Active Problems:   Essential thrombocytosis (HCC)   AVM (arteriovenous malformation) of small bowel, acquired   MVP (mitral valve prolapse)   Gastroesophageal reflux disease without esophagitis   Palpitations   ABLA (acute blood loss anemia)   Symptomatic anemia   Iron deficiency anemia due to chronic blood loss   Heme positive stool   Jejunal polyp   Benign neoplasm of sigmoid colon     LOS: 1 day   Tye Savoy ,NP 04/18/2022, 8:51 AM

## 2022-04-18 NOTE — Progress Notes (Signed)
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 Recheck CBC in AM. Dr. Doristine Bosworth notified.  Aralynn Brake S. Alford Highland, PharmD, BCPS Clinical Staff Pharmacist [email or web address]

## 2022-04-18 NOTE — TOC Transition Note (Signed)
Transition of Care [facility]) - CM/SW Discharge Note   Patient Details  Name: Kristina Osborne MRN: [id number] Date of Birth: 15-Jun-1951  [facility] CM/SW Contact:  Roseanne Kaufman, RN Phone Number: 04/18/2022, 9:54 AM   Clinical Narrative:   Patient has transportation home, uses [facility]: [place]. PAtient reports no DME and no HH services. No TOC needs at this time.  TOC signing off    Barriers to Discharge: Continued Medical Work up   Patient Goals and CMS Choice Patient states their goals for this hospitalization and ongoing recovery are:: to feel better      Discharge Placement                       Discharge Plan and Services   Discharge Planning Services: CM Consult              DME Agency: NA                  Social Determinants of Health (SDOH) Interventions     Readmission Risk Interventions     No data to display

## 2022-04-18 NOTE — Progress Notes (Signed)
PROGRESS NOTE    [name]  HYI:[id number] DOB: Jan 07, 1951 DOA: 04/15/2022 PCP: Biagio Borg, MD   Brief Narrative:  Kristina Osborne is a 71 y.o. female with medical history significant of essential thrombosis, JAK2 mutation, mitral valve prolapse, GERD and AVM of small intestine presented to ED with the complaint of shortness of breath.  Per patient, she usually walks about 1.5 miles daily but today she felt very short of breath.  She had to take rest and even after taking an hour of rest, she still continued to have shortness of breath along with some chest pain and palpitation. she has possibly noticed 1 episode of melena but she could not be sure.   She further tells me that she moved here from [place] in last August.  Prior to that, she was in [place] and she was diagnosed with JAK2 mutation about 2 years ago and received iron infusion twice a month until she moved here and then she established care with Dr. Lorenso Courier who told her that she does not need any IV iron anymore and according to her, she has been fine ever since.   Upon arrival to ED, she was hemodynamically stable.  Hemoglobin 9.9 which was 11.2 about a month ago and 13.35 months ago.  Admitted under hospitalist service, GI consulted.  Assessment & Plan:   Principal Problem:   GI bleeding Active Problems:   Essential thrombocytosis (HCC)   AVM (arteriovenous malformation) of small bowel, acquired   MVP (mitral valve prolapse)   Gastroesophageal reflux disease without esophagitis   Palpitations   ABLA (acute blood loss anemia)   Symptomatic anemia   Iron deficiency anemia due to chronic blood loss   Heme positive stool   Jejunal polyp   Benign neoplasm of sigmoid colon  Symptomatic anemia/acute blood loss anemia/possible upper GI bleed/duodenal angiectasias/colonic telangiectasias: Prior history of intestinal AVM.  She was FOBT positive in the ED. start her on twice daily IV Protonix, GI consulted. Hemoglobin  has dropped to 7.9 from 9.3 yesterday.  Likely as a result of bleeding that happened yesterday.  Continue Protonix, will recheck at 3 PM and transfuse if less than 7.  She also received 2 doses of IV iron yesterday.  May repeat another dose tomorrow.  Monitor overnight.    Essential thrombosis/JAK2 mutation: Per pharmacy, since hemoglobin is less than 8, hydroxyurea needs to be held, will recheck tomorrow.   Chest pain: Could be due to anemia or musculoskeletal.  ACS ruled out.   Hyperglycemia: Slightly hyperglycemic upon presentation.  Blood sugar better now.  Hemoglobin A1c only 4.4.  DVT prophylaxis: SCDs Start: 04/16/22 1606   Code Status: Full Code  Family Communication: Husband present at bedside.  Plan of care discussed with patient in length and he/she verbalized understanding and agreed with it.  Status is: Inpatient Remains inpatient appropriate because: Dropping hemoglobin, need further monitoring.     Estimated body mass index is 25.82 kg/m as calculated from the following:   Height as of this encounter: '5\' 6"'$  (1.676 m).   Weight as of this encounter: 72.6 kg.    Nutritional Assessment: Body mass index is 25.82 kg/m.Marland Kitchen Seen by dietician.  I agree with the assessment and plan as outlined below: Nutrition Status:        . Skin Assessment: I have examined the patient's skin and I agree with the wound assessment as performed by the wound care RN as outlined below:    Consultants:  GI  Procedures:  None  Antimicrobials:  Anti-infectives (From admission, onward)    None         Subjective:  Seen and examined.  She is just worried about drop in hemoglobin.  No other complaint.  Objective: Vitals:   04/17/22 2117 04/17/22 2214 04/18/22 0519 04/18/22 1244  BP: (!) 92/52 (!) 94/51 (!) 92/58 114/63  Pulse: 84  79 85  Resp: (!) '22  17 18  '$ Temp: (!) 97.5 F (36.4 C)  98.8 F (37.1 C) 98.3 F (36.8 C)  TempSrc:    Oral  SpO2: 100%  96% 98%  Weight:       Height:        Intake/Output Summary (Last 24 hours) at 04/18/2022 1247 Last data filed at 04/17/2022 1420 Gross per 24 hour  Intake 800 ml  Output --  Net 800 ml    Filed Weights   04/15/22 1556 04/17/22 1243  Weight: 77.1 kg 72.6 kg    Examination:  General exam: Appears calm and comfortable  Respiratory system: Clear to auscultation. Respiratory effort normal. Cardiovascular system: S1 & S2 heard, RRR. No JVD, murmurs, rubs, gallops or clicks. No pedal edema. Gastrointestinal system: Abdomen is nondistended, soft and nontender. No organomegaly or masses felt. Normal bowel sounds heard. Central nervous system: Alert and oriented. No focal neurological deficits. Extremities: Symmetric 5 x 5 power. Skin: No rashes, lesions or ulcers.  Psychiatry: Judgement and insight appear normal. Mood & affect appropriate.   Data Reviewed: I have personally reviewed following labs and imaging studies  CBC: Recent Labs  Lab 04/15/22 1557 04/16/22 0701 04/17/22 0355 04/18/22 0411  WBC 8.6  --  5.7 3.6*  NEUTROABS  --   --   --  2.8  HGB 9.9* 9.2* 9.3* 7.9*  HCT 31.4* 28.5* 29.8* 25.3*  MCV 115.4*  --  118.3* 116.6*  PLT 212  --  180 193    Basic Metabolic Panel: Recent Labs  Lab 04/15/22 1557 04/16/22 1622 04/17/22 0355  NA 138  --  142  K 3.8  --  3.6  CL 100  --  108  CO2 24  --  26  GLUCOSE 170*  --  107*  BUN 16  --  12  CREATININE 0.96  --  0.68  CALCIUM 9.7  --  9.0  MG  --  2.1  --     GFR: Estimated Creatinine Clearance: 66.7 mL/min (by C-G formula based on SCr of 0.68 mg/dL). Liver Function Tests: No results for input(s): "AST", "ALT", "ALKPHOS", "BILITOT", "PROT", "ALBUMIN" in the last 168 hours. No results for input(s): "LIPASE", "AMYLASE" in the last 168 hours. No results for input(s): "AMMONIA" in the last 168 hours. Coagulation Profile: No results for input(s): "INR", "PROTIME" in the last 168 hours. Cardiac Enzymes: No results for input(s):  "CKTOTAL", "CKMB", "CKMBINDEX", "TROPONINI" in the last 168 hours. BNP (last 3 results) No results for input(s): "PROBNP" in the last 8760 hours. HbA1C: Recent Labs    04/16/22 1623  HGBA1C 4.4*    CBG: No results for input(s): "GLUCAP" in the last 168 hours. Lipid Profile: No results for input(s): "CHOL", "HDL", "LDLCALC", "TRIG", "CHOLHDL", "LDLDIRECT" in the last 72 hours. Thyroid Function Tests: Recent Labs    04/16/22 1622  TSH 2.216    Anemia Panel: Recent Labs    04/16/22 0023 04/17/22 0355  FERRITIN  --  4*  TIBC 407 363  IRON 33 15*    Sepsis Labs: No results for input(s): "  PROCALCITON", "LATICACIDVEN" in the last 168 hours.  No results found for this or any previous visit (from the past 240 hour(s)).   Radiology Studies: No results found.  Scheduled Meds:  folic acid  1 mg Oral Daily   metoprolol tartrate  12.5 mg Oral BID   pantoprazole  40 mg Oral Q0600   Continuous Infusions:  lactated ringers 75 mL/hr at 04/17/22 2221     LOS: 1 day   Darliss Cheney, MD [facility]  04/18/2022, 12:47 PM   *Please note that this is a verbal dictation therefore any spelling or grammatical errors are due to the "[place]" system interpretation.  Please page via [place] and do not message via secure chat for urgent patient care matters. Secure chat can be used for non urgent patient care matters.  How to contact the [facility] Attending or Consulting provider [place] or covering provider during after hours [place], for this patient?  Check the care team in [facility] and look for a) attending/consulting TR[facility] provider listed and b) the [facility] team listed. Page or secure chat [phone]. Log into [email or web address] and use [place]'s universal password to access. If you do not have the password, please contact the hospital operator. Locate the [facility] provider you are looking for under [facility] and page to a number that you can be directly reached. If you still have  difficulty reaching the provider, please page the [facility] (Director on Call) for the Hospitalists listed on amion for assistance.

## 2022-04-18 NOTE — Progress Notes (Signed)
Mobility Specialist - Progress Note    04/18/22 1110  Mobility  Activity Ambulated independently in hallway  Range of Motion/Exercises Active  Level of Assistance Independent  Assistive Device None  Distance Ambulated (ft) 700 ft  Activity Response Tolerated well  $Mobility charge 1 Mobility   Pt was found in bed and agreeable to mobilize. Pt had no complaints during ambulation and at EOS returned to bed with all necessities within reach.   Ferd Hibbs Mobility Specialist

## 2022-04-19 ENCOUNTER — Telehealth: Payer: Self-pay | Admitting: *Deleted

## 2022-04-19 ENCOUNTER — Encounter (HOSPITAL_COMMUNITY): Payer: Self-pay | Admitting: Internal Medicine

## 2022-04-19 DIAGNOSIS — D473 Essential (hemorrhagic) thrombocythemia: Secondary | ICD-10-CM

## 2022-04-19 DIAGNOSIS — D62 Acute posthemorrhagic anemia: Secondary | ICD-10-CM

## 2022-04-19 DIAGNOSIS — K5520 Angiodysplasia of colon without hemorrhage: Secondary | ICD-10-CM

## 2022-04-19 DIAGNOSIS — K921 Melena: Secondary | ICD-10-CM | POA: Diagnosis not present

## 2022-04-19 DIAGNOSIS — D125 Benign neoplasm of sigmoid colon: Secondary | ICD-10-CM

## 2022-04-19 LAB — CBC
HCT: 25.8 % — ABNORMAL LOW (ref 36.0–46.0)
Hemoglobin: 8.2 g/dL — ABNORMAL LOW (ref 12.0–15.0)
MCH: 36.4 pg — ABNORMAL HIGH (ref 26.0–34.0)
MCHC: 31.8 g/dL (ref 30.0–36.0)
MCV: 114.7 fL — ABNORMAL HIGH (ref 80.0–100.0)
Platelets: 166 10*3/uL (ref 150–400)
RBC: 2.25 MIL/uL — ABNORMAL LOW (ref 3.87–5.11)
RDW: 12.2 % (ref 11.5–15.5)
WBC: 3.1 10*3/uL — ABNORMAL LOW (ref 4.0–10.5)
nRBC: 0 % (ref 0.0–0.2)

## 2022-04-19 MED ORDER — HYDROXYUREA 500 MG PO CAPS
500.0000 mg | ORAL_CAPSULE | Freq: Two times a day (BID) | ORAL | Status: DC
  Administered 2022-04-19: 500 mg via ORAL
  Filled 2022-04-19: qty 1

## 2022-04-19 NOTE — Telephone Encounter (Signed)
Received vm message from pt. She is asking about scheduling IV iron infusions-how many and how often. She is also asking about her continuing to take hydrea.  Dr. Lorenso Courier to see her  as she is an in patient @ [place] room 1436

## 2022-04-19 NOTE — Progress Notes (Signed)
Pt discharge to home at this time. Prior to DC, IV and tele was removed. Pt was given DC paperwork regarding medications,  condition, and follow up appointments. Pt verbalized understanding and stated no other concerns at this time. Pt stable at time of dc and left in personal vehicle driven by husband.

## 2022-04-19 NOTE — Discharge Summary (Signed)
Physician Discharge Summary   Patient: Kristina Osborne MRN: [id number] DOB: February 13, 1951  Admit date:     04/15/2022  Discharge date: 04/19/22  Discharge Physician: Edwin Dada   PCP: Biagio Borg, MD     Recommendations at discharge:  Follow up with PCP in 1 week Follow up with Oncology Dr. Lorenso Courier in 1 week Follow up with GI as directed     Discharge Diagnoses: Principal Problem:   Acute blood loss anemia Active Problems:   Symptomatic anemia   Bleeding Arteriovenous malformation of small bowel, acquired   Essential thrombocytosis (HCC)   Mitral valve prolapse   Gastroesophageal reflux disease without esophagitis   Iron deficiency anemia due to chronic blood loss   Jejunal polyp   Benign neoplasm of sigmoid colon      Hospital Course: Kristina Osborne is a 71 y.o. F with essential thrombocytosis who presented with fatigue and exertional intolerance.    In the ER, found to have Hgb 9.9 g/dL.  Admitted for melena.       Acute blood loss anemia   Symptomatic anemia   Bleeding Arteriovenous malformation of small bowel, acquired   Iron deficiency Admitted and found to have iron deficiency, worsening anemia in the hospital.  EGD with enteroscpy showed several freshly bleeding AVMs which were cauterized with argon plasma.  Colonoscopy showed no active bleeding.  VCE deferred.  Patient was monitored for 48 hours and Hgb remained stable, above 8 g/dL  Hydroxyurea restarted and discharged with close follow up in 1 week for CBC.       Essential thrombocytosis (HCC) Resumed hydroxyurea at discharge.  Has close follow up with Hematology - Repeat CBC in 1 week            DISCHARGE MEDICATION: Allergies as of 04/19/2022       Reactions   Aspirin Other (See Comments)   "Due to intestinal issue"   Sulfa Antibiotics         Medication List     STOP taking these medications    predniSONE 10 MG tablet Commonly known as: DELTASONE   traMADol 50  MG tablet Commonly known as: ULTRAM       TAKE these medications    cholecalciferol 25 MCG (1000 UNIT) tablet Commonly known as: VITAMIN D3 Take 1,000 Units by mouth daily.   famotidine 40 MG tablet Commonly known as: PEPCID Take 1 tablet (40 mg total) by mouth daily.   folic acid 1 MG tablet Commonly known as: FOLVITE TAKE 1 TABLET BY MOUTH EVERY DAY   glucosamine-chondroitin 500-400 MG tablet Take 1 tablet by mouth daily.   hydroxyurea 500 MG capsule Commonly known as: HYDREA TAKE 2 CAPS ALTERNATING WITH 3 CAPS EVERY OTHER DAY W/FOOD TO MINIMIZE GI EFFECTS What changed: See the new instructions.   methocarbamol 500 MG tablet Commonly known as: ROBAXIN Take 1 tablet (500 mg total) by mouth every 6 (six) hours as needed for muscle spasms.   MSM PO Take 1 capsule by mouth daily.   Multivitamin Adults 50+ Tabs Take 1 tablet by mouth daily.   sucralfate 1 g tablet Commonly known as: CARAFATE Take 1 tablet (1 g total) by mouth 4 (four) times daily -  with meals and at bedtime. What changed: when to take this   vitamin C 1000 MG tablet Take 500 mg by mouth in the morning and at bedtime.         Discharge Instructions     Discharge instructions  Complete by: As directed    From Dr. Loleta Books: You were admitted for anemia This was likely due to bleeding from AVMs, two of which were visualized by Dr. Hilarie Fredrickson in the jejunum and treated with argon laser coagulation. Hopefully, the bleeding is stopped now Also, you were given IV iron here, and that should replace oyur iron stores to the point that your body can keep up with any slight ongoing blood loss  Take your hydroxyurea now that your hemoglobin is over 8 g/dL  Go see Dr. Jenny Reichmann next Monday and Dr. Lorenso Courier on Wednesday  Ask Dr. Jenny Reichmann to cehck your hemoglobin on Monday   Increase activity slowly   Complete by: As directed        Discharge Exam: Filed Weights   04/15/22 1556 04/17/22 1243  Weight: 77.1 kg  72.6 kg    General: Pt is alert, awake, not in acute distress Cardiovascular: RRR, nl S1-S2, no murmurs appreciated.   No LE edema.   Respiratory: Normal respiratory rate and rhythm.  CTAB without rales or wheezes. Abdominal: Abdomen soft and non-tender.  No distension or HSM.   Neuro/Psych: Strength symmetric in upper and lower extremities.  Judgment and insight appear normal.   Condition at discharge: good  The results of significant diagnostics from this hospitalization (including imaging, microbiology, ancillary and laboratory) are listed below for reference.   Imaging Studies: DG Chest 2 View  Result Date: 04/15/2022 CLINICAL DATA:  Shortness of breath EXAM: CHEST - 2 VIEW COMPARISON:  10/05/2021 FINDINGS: Prior median sternotomy. Mitral valve annular calcifications. Heart is normal size. Lungs clear. No effusions or edema. No acute bony abnormality. IMPRESSION: No active cardiopulmonary disease. Electronically Signed   By: Rolm Baptise M.D.   On: 04/15/2022 17:06    Microbiology: Results for orders placed or performed during the hospital encounter of 09/01/21  SARS CORONAVIRUS 2 (TAT 6-24 HRS) Nasopharyngeal Nasopharyngeal Swab     Status: Abnormal   Collection Time: 09/01/21 10:26 AM   Specimen: Nasopharyngeal Swab  Result Value Ref Range Status   SARS Coronavirus 2 POSITIVE (A) NEGATIVE Final    Comment: (NOTE) SARS-CoV-2 target nucleic acids are DETECTED.  The SARS-CoV-2 RNA is generally detectable in upper and lower respiratory specimens during the acute phase of infection. Positive results are indicative of the presence of SARS-CoV-2 RNA. Clinical correlation with patient history and other diagnostic information is  necessary to determine patient infection status. Positive results do not rule out bacterial infection or co-infection with other viruses.  The expected result is Negative.  Fact Sheet for Patients: SugarRoll.be  Fact Sheet  for Healthcare Providers: https://www.woods-mathews.com/  This test is not yet approved or cleared by the [place] FDA and  has been authorized for detection and/or diagnosis of SARS-CoV-2 by FDA under an Emergency Use Authorization (EUA). This EUA will remain  in effect (meaning this test can be used) for the duration of the COVID-19 declaration under Section 564(b)(1) of the Act, 21 U. S.C. section 360bbb-3(b)(1), unless the authorization is terminated or revoked sooner.   Performed at [facility], [place] [address], [place],  [address]     Labs: CBC: Recent Labs  Lab 04/15/22 1557 04/16/22 0701 04/17/22 0355 04/18/22 0411 04/18/22 1437 04/19/22 0436  WBC 8.6  --  5.7 3.6* 4.3 3.1*  NEUTROABS  --   --   --  2.8 3.3  --   HGB 9.9* 9.2* 9.3* 7.9* 8.3* 8.2*  HCT 31.4* 28.5* 29.8* 25.3* 26.7*  25.8*  MCV 115.4*  --  118.3* 116.6* 116.1* 114.7*  PLT 212  --  180 172 189 465   Basic Metabolic Panel: Recent Labs  Lab 04/15/22 1557 04/16/22 1622 04/17/22 0355  NA 138  --  142  K 3.8  --  3.6  CL 100  --  108  CO2 24  --  26  GLUCOSE 170*  --  107*  BUN 16  --  12  CREATININE 0.96  --  0.68  CALCIUM 9.7  --  9.0  MG  --  2.1  --    Liver Function Tests: No results for input(s): "AST", "ALT", "ALKPHOS", "BILITOT", "PROT", "ALBUMIN" in the last 168 hours. CBG: No results for input(s): "GLUCAP" in the last 168 hours.  Discharge time spent: approximately 35 minutes spent on discharge counseling, evaluation of patient on day of discharge, and coordination of discharge planning with nursing, social work, pharmacy and case management  Signed: Edwin Dada, MD [facility] 04/19/2022

## 2022-04-20 ENCOUNTER — Telehealth: Payer: Self-pay

## 2022-04-20 NOTE — Telephone Encounter (Signed)
Transition Care Management Follow-up Telephone Call Date of discharge and from where: [place] 04-19-22 Dx: Acute blood loss anemia How have you been since you were released from the hospital? Doing ok  Any questions or concerns? No  Items Reviewed: Did the pt receive and understand the discharge instructions provided? Yes  Medications obtained and verified? Yes  Other? No  Any new allergies since your discharge? No  Dietary orders reviewed? Yes Do you have support at home? Yes   Home Care and Equipment/Supplies: Were home health services ordered? no If so, what is the name of the agency? na  Has the agency set up a time to come to the patient's home? not applicable Were any new equipment or medical supplies ordered?  No What is the name of the medical supply agency? na Were you able to get the supplies/equipment? not applicable Do you have any questions related to the use of the equipment or supplies? No  Functional Questionnaire: (I = Independent and D = Dependent) ADLs: I  Bathing/Dressing- I  Meal Prep- I  Eating- I  Maintaining continence- I  Transferring/Ambulation- I  Managing Meds- I  Follow up appointments reviewed:  PCP Hospital f/u appt confirmed? Yes  Scheduled to see Dr Jenny Reichmann on [facility]24-22 [facility] confirmed? Yes  Scheduled to see Dr Lorenso Courier on 04-26-22 @ 11am. Are transportation arrangements needed? No  If their condition worsens, is the pt aware to call PCP or go to the Emergency Dept.? Yes Was the patient provided with contact information for the PCP's office or ED? Yes Was to pt encouraged to call back with questions or concerns? Yes

## 2022-04-21 ENCOUNTER — Telehealth: Payer: Self-pay

## 2022-04-21 ENCOUNTER — Telehealth: Payer: Self-pay | Admitting: Hematology and Oncology

## 2022-04-21 ENCOUNTER — Other Ambulatory Visit: Payer: Self-pay | Admitting: Hematology and Oncology

## 2022-04-21 NOTE — Telephone Encounter (Signed)
Called [name] to discuss the results of her blood work while in hospital and steps moving forward.  At this time findings appear consistent with iron deficiency anemia secondary to GI bleed.  She was found to have angiodysplasias treated with ablation.  At this time it appears the source of her GI bleed has been identified and controlled.  She received 1 dose of 250 mg ferric gluconate while in the hospital.  At this time my recommendations are:  1) will schedule an additional dose of IV iron. Plan for monoferric 1000 mg x 1 dose at [place]. Order placed today  2) recommend HOLDING hydroxyurea as this medication will inhibit the recovery of her RBC. Can re-start once Hgb has been shown to be improving  3) plan for CBC to be drawn on 04/26/2022. Will schedule labs/clinic visit 1 week after this to restart hydroxyurea.   Left message for patient requesting callback.  Ledell Peoples, MD Department of Hematology/Oncology [place] [facility] Phone: [phone] Pager: [phone] Email: [email or web address]

## 2022-04-21 NOTE — Telephone Encounter (Signed)
Patient returned Dr. Libby Maw call. . I reviewed Dr. Libby Maw detailed telephone note with pt. She voiced understanding

## 2022-04-21 NOTE — Telephone Encounter (Signed)
Patient called asking if it is possible for her to have an iron infusion on 04/26/22. Patient recently admitted to hospital and she states that "they found the source of bleeding and stopped it"

## 2022-04-24 ENCOUNTER — Telehealth: Payer: Self-pay | Admitting: Pharmacy Technician

## 2022-04-24 ENCOUNTER — Other Ambulatory Visit: Payer: Self-pay | Admitting: Pharmacy Technician

## 2022-04-24 ENCOUNTER — Ambulatory Visit: Payer: No Typology Code available for payment source | Admitting: Internal Medicine

## 2022-04-24 ENCOUNTER — Telehealth: Payer: Self-pay | Admitting: Hematology and Oncology

## 2022-04-24 ENCOUNTER — Telehealth: Payer: Self-pay | Admitting: *Deleted

## 2022-04-24 NOTE — Telephone Encounter (Signed)
Contacted patient to scheduled appointments. Patient is aware of appointments that are scheduled.   

## 2022-04-24 NOTE — Telephone Encounter (Addendum)
Dr. Lorenso Courier, We will update the tx plan and schedule patient as soon as possible

## 2022-04-24 NOTE — Telephone Encounter (Signed)
Dr. Lorenso Courier,  Monoferric is non-preferred medication for [facility] and will likely be denied to patient has not tried and or failed step therapy.  Preferred medications are Venofer or Ferrlecit. Would you like to try Venofer?

## 2022-04-24 NOTE — Telephone Encounter (Signed)
Pt called to f/u on scheduling of iron infusion. Called [address] [facility] and was advised that prior auth had not been done yet due to staff being on PAL last week and prior Josem Kaufmann is being started today. Called pt to f/u with information. Pt was appreciative and verbalized understanding.

## 2022-04-26 ENCOUNTER — Other Ambulatory Visit: Payer: Self-pay

## 2022-04-26 ENCOUNTER — Inpatient Hospital Stay: Payer: No Typology Code available for payment source | Attending: Hematology and Oncology

## 2022-04-26 ENCOUNTER — Other Ambulatory Visit: Payer: Self-pay | Admitting: *Deleted

## 2022-04-26 DIAGNOSIS — D473 Essential (hemorrhagic) thrombocythemia: Secondary | ICD-10-CM

## 2022-04-26 LAB — CBC WITH DIFFERENTIAL (CANCER CENTER ONLY)
Abs Immature Granulocytes: 0.01 10*3/uL (ref 0.00–0.07)
Basophils Absolute: 0 10*3/uL (ref 0.0–0.1)
Basophils Relative: 1 %
Eosinophils Absolute: 0 10*3/uL (ref 0.0–0.5)
Eosinophils Relative: 1 %
HCT: 32.8 % — ABNORMAL LOW (ref 36.0–46.0)
Hemoglobin: 10.6 g/dL — ABNORMAL LOW (ref 12.0–15.0)
Immature Granulocytes: 0 %
Lymphocytes Relative: 13 %
Lymphs Abs: 0.7 10*3/uL (ref 0.7–4.0)
MCH: 36.3 pg — ABNORMAL HIGH (ref 26.0–34.0)
MCHC: 32.3 g/dL (ref 30.0–36.0)
MCV: 112.3 fL — ABNORMAL HIGH (ref 80.0–100.0)
Monocytes Absolute: 0.5 10*3/uL (ref 0.1–1.0)
Monocytes Relative: 9 %
Neutro Abs: 3.9 10*3/uL (ref 1.7–7.7)
Neutrophils Relative %: 76 %
Platelet Count: 314 10*3/uL (ref 150–400)
RBC: 2.92 MIL/uL — ABNORMAL LOW (ref 3.87–5.11)
RDW: 12.8 % (ref 11.5–15.5)
WBC Count: 5.1 10*3/uL (ref 4.0–10.5)
nRBC: 0 % (ref 0.0–0.2)

## 2022-04-26 LAB — CMP (CANCER CENTER ONLY)
ALT: 16 U/L (ref 0–44)
AST: 22 U/L (ref 15–41)
Albumin: 4.5 g/dL (ref 3.5–5.0)
Alkaline Phosphatase: 59 U/L (ref 38–126)
Anion gap: 6 (ref 5–15)
BUN: 17 mg/dL (ref 8–23)
CO2: 26 mmol/L (ref 22–32)
Calcium: 9.9 mg/dL (ref 8.9–10.3)
Chloride: 106 mmol/L (ref 98–111)
Creatinine: 0.74 mg/dL (ref 0.44–1.00)
GFR, Estimated: >60 mL/min (ref >60)
Glucose, Bld: 96 mg/dL (ref 70–99)
Potassium: 4.2 mmol/L (ref 3.5–5.1)
Sodium: 138 mmol/L (ref 135–145)
Total Bilirubin: 0.4 mg/dL (ref 0.3–1.2)
Total Protein: 6.9 g/dL (ref 6.5–8.1)

## 2022-04-26 LAB — RETIC PANEL
Immature Retic Fract: 35.5 % — ABNORMAL HIGH (ref 2.3–15.9)
RBC.: 3.01 MIL/uL — ABNORMAL LOW (ref 3.87–5.11)
Retic Count, Absolute: 104.4 10*3/uL (ref 19.0–186.0)
Retic Ct Pct: 3.5 % — ABNORMAL HIGH (ref 0.4–3.1)
Reticulocyte Hemoglobin: 29 pg (ref >27.9)

## 2022-04-26 LAB — IRON AND IRON BINDING CAPACITY (CC-WL,HP ONLY)
Iron: 22 ug/dL — ABNORMAL LOW (ref 28–170)
Saturation Ratios: 5 % — ABNORMAL LOW (ref 10.4–31.8)
TIBC: 452 ug/dL — ABNORMAL HIGH (ref 250–450)
UIBC: 430 ug/dL (ref 148–442)

## 2022-04-26 LAB — FERRITIN: Ferritin: 32 ng/mL (ref 11–307)

## 2022-04-26 LAB — SAMPLE TO BLOOD BANK

## 2022-04-27 ENCOUNTER — Ambulatory Visit (INDEPENDENT_AMBULATORY_CARE_PROVIDER_SITE_OTHER): Payer: No Typology Code available for payment source | Admitting: *Deleted

## 2022-04-27 VITALS — BP 91/55 | HR 80 | Temp 98.7°F | Resp 16

## 2022-04-27 DIAGNOSIS — K922 Gastrointestinal hemorrhage, unspecified: Secondary | ICD-10-CM | POA: Diagnosis not present

## 2022-04-27 DIAGNOSIS — D500 Iron deficiency anemia secondary to blood loss (chronic): Secondary | ICD-10-CM

## 2022-04-27 MED ORDER — SODIUM CHLORIDE 0.9 % IV SOLN
200.0000 mg | Freq: Once | INTRAVENOUS | Status: AC
  Administered 2022-04-27: 200 mg via INTRAVENOUS
  Filled 2022-04-27: qty 10

## 2022-04-27 NOTE — Progress Notes (Signed)
Diagnosis: Iron Deficiency Anemia  Provider:  Marshell Garfinkel MD  Procedure: Infusion  IV Type: Peripheral, IV Location: L Forearm  Venofer (Iron Sucrose), Dose: 200 mg  Infusion Start Time: 0383 pm  Infusion Stop Time: 1430 pm  Post Infusion IV Care: Observation period completed and Peripheral IV Discontinued  Discharge: Condition: Good, Destination: Home . AVS provided to patient.   Performed by:  Oren Beckmann, RN

## 2022-05-01 ENCOUNTER — Other Ambulatory Visit: Payer: Self-pay | Admitting: Hematology and Oncology

## 2022-05-02 ENCOUNTER — Telehealth: Payer: Self-pay

## 2022-05-02 ENCOUNTER — Encounter: Payer: Self-pay | Admitting: Hematology and Oncology

## 2022-05-02 NOTE — Telephone Encounter (Signed)
Pt called asking if she should restart hydroxyurea today or wait until she has MD visit on 05/05/22.  Routed to provider.

## 2022-05-02 NOTE — Telephone Encounter (Signed)
Per Dr. Lorenso Courier, called patient to answer question regarding hydroxyurea. Patient instructed to hold this medication until visit with Dr. Lorenso Courier on 05/04/22. Patient verbalized understanding.

## 2022-05-02 NOTE — Telephone Encounter (Signed)
Dr. Lorenso Courier instructed that pt should hold her hydroxyurea until apt on 05/05/22. Called patient and she verbalized understanding.

## 2022-05-03 ENCOUNTER — Other Ambulatory Visit: Payer: Self-pay | Admitting: Hematology and Oncology

## 2022-05-04 ENCOUNTER — Ambulatory Visit (INDEPENDENT_AMBULATORY_CARE_PROVIDER_SITE_OTHER): Payer: No Typology Code available for payment source

## 2022-05-04 VITALS — BP 93/7 | HR 97 | Temp 98.7°F | Resp 18 | Ht 66.0 in

## 2022-05-04 DIAGNOSIS — D500 Iron deficiency anemia secondary to blood loss (chronic): Secondary | ICD-10-CM | POA: Diagnosis not present

## 2022-05-04 MED ORDER — SODIUM CHLORIDE 0.9 % IV SOLN
200.0000 mg | Freq: Once | INTRAVENOUS | Status: AC
  Administered 2022-05-04: 200 mg via INTRAVENOUS
  Filled 2022-05-04: qty 10

## 2022-05-04 NOTE — Progress Notes (Signed)
Diagnosis: Iron Deficiency Anemia  Provider:  Marshell Garfinkel MD  Procedure: Infusion  IV Type: Peripheral, IV Location: R Forearm  Venofer (Iron Sucrose), Dose: 200 mg  Infusion Start Time: 3291  Infusion Stop Time: 9166  Post Infusion IV Care: Peripheral IV Discontinued  Discharge: Condition: Good, Destination: Home . AVS provided to patient.   Performed by:  Arnoldo Morale, RN

## 2022-05-05 ENCOUNTER — Other Ambulatory Visit: Payer: Self-pay | Admitting: Hematology and Oncology

## 2022-05-05 ENCOUNTER — Inpatient Hospital Stay: Payer: No Typology Code available for payment source

## 2022-05-05 ENCOUNTER — Inpatient Hospital Stay
Payer: No Typology Code available for payment source | Attending: Hematology and Oncology | Admitting: Hematology and Oncology

## 2022-05-05 VITALS — BP 113/69 | HR 71 | Temp 98.1°F | Resp 15

## 2022-05-05 DIAGNOSIS — Q2730 Arteriovenous malformation, site unspecified: Secondary | ICD-10-CM | POA: Insufficient documentation

## 2022-05-05 DIAGNOSIS — D500 Iron deficiency anemia secondary to blood loss (chronic): Secondary | ICD-10-CM | POA: Diagnosis not present

## 2022-05-05 DIAGNOSIS — D473 Essential (hemorrhagic) thrombocythemia: Secondary | ICD-10-CM

## 2022-05-05 DIAGNOSIS — D75839 Thrombocytosis, unspecified: Secondary | ICD-10-CM | POA: Insufficient documentation

## 2022-05-05 LAB — CBC WITH DIFFERENTIAL (CANCER CENTER ONLY)
Abs Immature Granulocytes: 0.01 10*3/uL (ref 0.00–0.07)
Basophils Absolute: 0 10*3/uL (ref 0.0–0.1)
Basophils Relative: 0 %
Eosinophils Absolute: 0 10*3/uL (ref 0.0–0.5)
Eosinophils Relative: 0 %
HCT: 36.6 % (ref 36.0–46.0)
Hemoglobin: 11.3 g/dL — ABNORMAL LOW (ref 12.0–15.0)
Immature Granulocytes: 0 %
Lymphocytes Relative: 11 %
Lymphs Abs: 0.8 10*3/uL (ref 0.7–4.0)
MCH: 35 pg — ABNORMAL HIGH (ref 26.0–34.0)
MCHC: 30.9 g/dL (ref 30.0–36.0)
MCV: 113.3 fL — ABNORMAL HIGH (ref 80.0–100.0)
Monocytes Absolute: 0.5 10*3/uL (ref 0.1–1.0)
Monocytes Relative: 7 %
Neutro Abs: 5.7 10*3/uL (ref 1.7–7.7)
Neutrophils Relative %: 82 %
Platelet Count: 632 10*3/uL — ABNORMAL HIGH (ref 150–400)
RBC: 3.23 MIL/uL — ABNORMAL LOW (ref 3.87–5.11)
RDW: 12.8 % (ref 11.5–15.5)
WBC Count: 7 10*3/uL (ref 4.0–10.5)
nRBC: 0 % (ref 0.0–0.2)

## 2022-05-05 LAB — CMP (CANCER CENTER ONLY)
ALT: 16 U/L (ref 0–44)
AST: 23 U/L (ref 15–41)
Albumin: 4.4 g/dL (ref 3.5–5.0)
Alkaline Phosphatase: 57 U/L (ref 38–126)
Anion gap: 4 — ABNORMAL LOW (ref 5–15)
BUN: 11 mg/dL (ref 8–23)
CO2: 29 mmol/L (ref 22–32)
Calcium: 10.1 mg/dL (ref 8.9–10.3)
Chloride: 104 mmol/L (ref 98–111)
Creatinine: 0.87 mg/dL (ref 0.44–1.00)
GFR, Estimated: >60 mL/min (ref >60)
Glucose, Bld: 98 mg/dL (ref 70–99)
Potassium: 4.9 mmol/L (ref 3.5–5.1)
Sodium: 137 mmol/L (ref 135–145)
Total Bilirubin: 0.3 mg/dL (ref 0.3–1.2)
Total Protein: 6.9 g/dL (ref 6.5–8.1)

## 2022-05-06 ENCOUNTER — Encounter: Payer: Self-pay | Admitting: Hematology and Oncology

## 2022-05-06 NOTE — Progress Notes (Signed)
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 Lorenso Courier  10/26/2021: WBC 6.7, Hgb 13.3, MCV 121.7, Plt 204 03/10/2022: WBC 3.7, Hgb 11.2, MCV 120, Plt 261. Decrease hydroxyurea to 1000 mg BID due to cytopenias.   Interval History:  Kristina Osborne 71 y.o. female with medical history significant for essential thrombocytosis who presents for a follow up visit. The patient's last visit was on 03/10/2022. In the interim since the last visit Kristina Osborne had a GI bleed requiring hospitalization.  She underwent a small bowel endoscopy on 04/17/2022 and was found to have 2 angiectasia's with stigmata of recent bleeding.  These were treated with fulguration.  On exam today Kristina Osborne is accompanied by her husband.  She reports she feels much better and is delighted that gastroenterology was able to find the AVMs treated with argon plasma.  She reports she is received 2 doses of IV iron therapy and tolerated it well.  She has 3 more to go.  She continues to take her folic acid daily.  She has been holding hydroxyurea since the diagnosis of the GI bleed.  She is willing and able to restart hydroxyurea at this time.  She is not having any major side effects prior to the development of a GI bleed.  Overall she is returned to her baseline level of health and feels quite well.  She also denies any signs or symptoms concerning for VTE including shortness of breath, leg swelling, or chest pain.  A full 10 point ROS is listed below.   MEDICAL HISTORY:  Past Medical History:  Diagnosis Date   ABLA  (acute blood loss anemia) 04/16/2022   Congenital malformation of intestinal fixation    Gastritis    Gastroesophageal reflux disease without esophagitis 09/10/2021   IDA (iron deficiency anemia)    Mitral valve prolapse    Thrombocytosis     SURGICAL HISTORY: Past Surgical History:  Procedure Laterality Date   COLONOSCOPY N/A 04/17/2022   Procedure: COLONOSCOPY;  Surgeon: Jerene Bears, MD;  Location: [facility];  Service: Gastroenterology;  Laterality: N/A;   ENTEROSCOPY N/A 04/17/2022   Procedure: ENTEROSCOPY;  Surgeon: Jerene Bears, MD;  Location: [facility];  Service: Gastroenterology;  Laterality: N/A;   HEMOSTASIS CLIP PLACEMENT  04/17/2022   Procedure: HEMOSTASIS CLIP PLACEMENT;  Surgeon: Jerene Bears, MD;  Location: [facility];  Service: Gastroenterology;;   HOT HEMOSTASIS N/A 04/17/2022   Procedure: HOT HEMOSTASIS (ARGON PLASMA COAGULATION/BICAP);  Surgeon: Jerene Bears, MD;  Location: [facility];  Service: Gastroenterology;  Laterality: N/A;   POLYPECTOMY  04/17/2022   Procedure: POLYPECTOMY;  Surgeon: Jerene Bears, MD;  Location: [facility];  Service: Gastroenterology;;   SMALL INTESTINE SURGERY  1994    SOCIAL HISTORY: Social History   Socioeconomic History   Marital status: Married    Spouse name: Not on file   Number of children: 0   Years of education: Not on file   Highest education level: Not on file  Occupational History   Not on file  Tobacco Use   Smoking status: Former    Years: 15.00  Types: Cigarettes   Smokeless tobacco: Never  Vaping Use   Vaping Use: Never used  Substance and Sexual Activity   Alcohol use: Not Currently   Drug use: Never   Sexual activity: Yes    Partners: Male  Other Topics Concern   Not on file  Social History Narrative   Not on file   Social Determinants of Health   Financial Resource Strain: Not on file  Food Insecurity: Not on file  Transportation Needs: Not on file  Physical Activity: Not on file   Stress: Not on file  Social Connections: Not on file  Intimate Partner Violence: Not on file    FAMILY HISTORY: Family History  Problem Relation Age of Onset   Heart disease Mother    Valvular heart disease Mother    Healthy Sister    Alcoholism Half-Sister     ALLERGIES:  is allergic to aspirin and sulfa antibiotics.  MEDICATIONS:  Current Outpatient Medications  Medication Sig Dispense Refill   Ascorbic Acid (VITAMIN C) 1000 MG tablet Take 500 mg by mouth in the morning and at bedtime.     cholecalciferol (VITAMIN D3) 25 MCG (1000 UNIT) tablet Take 1,000 Units by mouth daily.     famotidine (PEPCID) 40 MG tablet Take 1 tablet (40 mg total) by mouth daily. 90 tablet 3   folic acid (FOLVITE) 1 MG tablet TAKE 1 TABLET BY MOUTH EVERY DAY 30 tablet 1   glucosamine-chondroitin 500-400 MG tablet Take 1 tablet by mouth daily.     hydroxyurea (HYDREA) 500 MG capsule Take 1 capsule (500 mg total) by mouth 2 (two) times daily. 90 capsule 1   methocarbamol (ROBAXIN) 500 MG tablet Take 1 tablet (500 mg total) by mouth every 6 (six) hours as needed for muscle spasms. 40 tablet 1   Methylsulfonylmethane (MSM PO) Take 1 capsule by mouth daily.     metoprolol tartrate (LOPRESSOR) 25 MG tablet Take 12.5 mg by mouth 2 (two) times daily.     Multiple Vitamins-Minerals (MULTIVITAMIN ADULTS 50+) TABS Take 1 tablet by mouth daily.     sucralfate (CARAFATE) 1 g tablet Take 1 tablet (1 g total) by mouth 4 (four) times daily -  with meals and at bedtime. (Patient taking differently: Take 1 g by mouth 2 (two) times daily.) 360 tablet 3   No current facility-administered medications for this visit.    REVIEW OF SYSTEMS:   Constitutional: ( - ) fevers, ( - )  chills , ( - ) night sweats Eyes: ( - ) blurriness of vision, ( - ) double vision, ( - ) watery eyes Ears, nose, mouth, throat, and face: ( - ) mucositis, ( - ) sore throat Respiratory: ( - ) cough, ( - ) dyspnea, ( - ) wheezes Cardiovascular: ( -  ) palpitation, ( - ) chest discomfort, ( - ) lower extremity swelling Gastrointestinal:  ( - ) nausea, ( - ) heartburn, ( - ) change in bowel habits Skin: ( - ) abnormal skin rashes Lymphatics: ( - ) new lymphadenopathy, ( - ) easy bruising Neurological: ( - ) numbness, ( - ) tingling, ( - ) new weaknesses Behavioral/Psych: ( - ) mood change, ( - ) new changes  All other systems were reviewed with the patient and are negative.  PHYSICAL EXAMINATION: ECOG PERFORMANCE STATUS: 0 - Asymptomatic  Vitals:   05/05/22 1214  BP: 113/69  Pulse: 71  Resp: 15  Temp: 98.1 F (36.7 C)  SpO2: 100%  Filed Weights    GENERAL: Well-appearing elderly Caucasian female, alert, no distress and comfortable SKIN: skin color, texture, turgor are normal, no rashes or significant lesions EYES: conjunctiva are pink and non-injected, sclera clear LUNGS: clear to auscultation and percussion with normal breathing effort HEART: regular rate & rhythm and no murmurs and no lower extremity edema Musculoskeletal: no cyanosis of digits and no clubbing  PSYCH: alert & oriented x 3, fluent speech NEURO: no focal motor/sensory deficits  LABORATORY DATA:  I have reviewed the data as listed    Latest Ref Rng & Units 05/05/2022   11:49 AM 04/26/2022   11:31 AM 04/19/2022    4:36 AM  CBC  WBC 4.0 - 10.5 K/uL 7.0  5.1  3.1   Hemoglobin 12.0 - 15.0 g/dL 11.3  10.6  8.2   Hematocrit 36.0 - 46.0 % 36.6  32.8  25.8   Platelets 150 - 400 K/uL 632  314  166        Latest Ref Rng & Units 05/05/2022   11:49 AM 04/26/2022   11:31 AM 04/17/2022    3:55 AM  CMP  Glucose 70 - 99 mg/dL 98  96  107   BUN 8 - 23 mg/dL '11  17  12   ' Creatinine 0.44 - 1.00 mg/dL 0.87  0.74  0.68   Sodium 135 - 145 mmol/L 137  138  142   Potassium 3.5 - 5.1 mmol/L 4.9  4.2  3.6   Chloride 98 - 111 mmol/L 104  106  108   CO2 22 - 32 mmol/L '29  26  26   ' Calcium 8.9 - 10.3 mg/dL 10.1  9.9  9.0   Total Protein 6.5 - 8.1 g/dL 6.9  6.9    Total  Bilirubin 0.3 - 1.2 mg/dL 0.3  0.4    Alkaline Phos 38 - 126 U/L 57  59    AST 15 - 41 U/L 23  22    ALT 0 - 44 U/L 16  16     RADIOGRAPHIC STUDIES: DG Chest 2 View  Result Date: 04/15/2022 CLINICAL DATA:  Shortness of breath EXAM: CHEST - 2 VIEW COMPARISON:  10/05/2021 FINDINGS: Prior median sternotomy. Mitral valve annular calcifications. Heart is normal size. Lungs clear. No effusions or edema. No acute bony abnormality. IMPRESSION: No active cardiopulmonary disease. Electronically Signed   By: Rolm Baptise M.D.   On: 04/15/2022 17:06    ASSESSMENT & PLAN Jess Sulak 71 y.o. female with medical history significant for essential thrombocytosis who presents for a follow up visit.   After review of the labs, review of the records, and discussion with the patient the patients findings are most consistent with thrombocytosis, reported due to essential thrombocythemia. We have received the records showing that she has a JAK2 mutation.  Additionally she will require a bone marrow biopsy in order to assure that essential thrombocythemia is the diagnosis.  In the interim we will have the patient continue her hydroxyurea as previously prescribed by her provider in [place].  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  She will require a bone marrow biopsy to confirm this diagnosis --JAK2 testing received from patient's prior hematologist. Confirmed JAK2 mutation. Plan:   -- If and when the patient is agreeable we will schedule her for a bone marrow biopsy. --Hydroxyurea was held due to GI bleed  and anemia.  Anemia is proving with IV iron therapy and therefore we can restart hydroxyurea at this time. --restart 1000 mg BID hydroxyurea.( Previously on 1500 mg hydroxyurea alternating with 1000 mg every other day) -- Labs today show white blood cell count 7.0, hemoglobin 11.3, MCV  113.3, and platelets of 632. --Return to clinic in 3 months time with interval 6 week lab check.    No orders of the defined types were placed in this encounter.   All questions were answered. The patient knows to call the clinic with any problems, questions or concerns.  A total of more than 30 minutes were spent on this encounter with face-to-face time and non-face-to-face time, including preparing to see the patient, ordering tests and/or medications, counseling the patient and coordination of care as outlined above.   Kristina Peoples, MD Department of Hematology/Oncology [place] [facility] Phone: [phone] Pager: [phone] Email: [email or web address]  05/06/2022 5:20 PM

## 2022-05-08 ENCOUNTER — Telehealth: Payer: Self-pay | Admitting: Hematology and Oncology

## 2022-05-08 NOTE — Telephone Encounter (Signed)
Per 9/8 los called and left message for pt about appointments

## 2022-05-11 ENCOUNTER — Ambulatory Visit (INDEPENDENT_AMBULATORY_CARE_PROVIDER_SITE_OTHER): Payer: No Typology Code available for payment source

## 2022-05-11 VITALS — BP 101/68 | HR 80 | Temp 98.2°F | Resp 16 | Ht 66.0 in | Wt 170.0 lb

## 2022-05-11 DIAGNOSIS — D500 Iron deficiency anemia secondary to blood loss (chronic): Secondary | ICD-10-CM | POA: Diagnosis not present

## 2022-05-11 DIAGNOSIS — K5520 Angiodysplasia of colon without hemorrhage: Secondary | ICD-10-CM | POA: Diagnosis not present

## 2022-05-11 MED ORDER — SODIUM CHLORIDE 0.9 % IV SOLN
200.0000 mg | Freq: Once | INTRAVENOUS | Status: AC
  Administered 2022-05-11: 200 mg via INTRAVENOUS
  Filled 2022-05-11: qty 10

## 2022-05-11 NOTE — Progress Notes (Signed)
Diagnosis: Iron Deficiency Anemia  Provider:  Marshell Garfinkel MD  Procedure: Infusion  IV Type: Peripheral, IV Location: R Forearm  Venofer (Iron Sucrose), Dose: 200 mg  Infusion Start Time: 2336  Infusion Stop Time: 1224  Post Infusion IV Care: Peripheral IV Discontinued  Discharge: Condition: Good, Destination: Home . AVS provided to patient.   Performed by:  Adelina Mings, LPN

## 2022-05-18 ENCOUNTER — Other Ambulatory Visit: Payer: Self-pay | Admitting: *Deleted

## 2022-05-18 ENCOUNTER — Telehealth: Payer: Self-pay | Admitting: Internal Medicine

## 2022-05-18 ENCOUNTER — Telehealth: Payer: Self-pay | Admitting: *Deleted

## 2022-05-18 ENCOUNTER — Ambulatory Visit (INDEPENDENT_AMBULATORY_CARE_PROVIDER_SITE_OTHER): Payer: No Typology Code available for payment source | Admitting: *Deleted

## 2022-05-18 VITALS — BP 120/75 | HR 85 | Temp 98.3°F | Resp 16 | Ht 66.0 in | Wt 170.0 lb

## 2022-05-18 DIAGNOSIS — D500 Iron deficiency anemia secondary to blood loss (chronic): Secondary | ICD-10-CM

## 2022-05-18 MED ORDER — SODIUM CHLORIDE 0.9 % IV SOLN
200.0000 mg | Freq: Once | INTRAVENOUS | Status: AC
  Administered 2022-05-18: 200 mg via INTRAVENOUS
  Filled 2022-05-18: qty 10

## 2022-05-18 NOTE — Telephone Encounter (Signed)
Patient is returning your call.  

## 2022-05-18 NOTE — Telephone Encounter (Signed)
Inbound call from patient stating she thinks she has a GI bleed and is seeking further advice.

## 2022-05-18 NOTE — Telephone Encounter (Signed)
Received vm message from patient stating that she had melena x 2 today and 1 dark stool the night before. No further bleeding late this afternoon. Requesting advice. TCT patient and spoke with her. Advised that Dr. Lorenso Courier would like to check her blood counts tomorrow to see where they are. Pt to wait in the lobby for results. She did get IV iron today and her last infusion of iron in on 05/25/22.  Dr. Lorenso Courier will evaluate her labs tomorrow and provide any needed recommendations at that time. Pt knows to go to the ED if bleeding persists. Pt voiced understanding.  Scheduling message sent for labs tomorrow.  Orders placed.

## 2022-05-18 NOTE — Telephone Encounter (Signed)
Pt states bleeding stopped. She is requesting another call back, will be in meeting until 1pm

## 2022-05-18 NOTE — Telephone Encounter (Signed)
Left message for pt to call back  °

## 2022-05-18 NOTE — Telephone Encounter (Signed)
Spoke with pt and she is aware of Dr. Lynne Leader recommendations.

## 2022-05-18 NOTE — Telephone Encounter (Signed)
Pt report she saw what she thought looked maroon color in her stool, this morning stool is very dark. Discussed with pt that she needs to go to the ER to be evaluated. Pt not happy with this recommendation but understands it is what she needs to do.

## 2022-05-18 NOTE — Progress Notes (Signed)
Diagnosis: Iron Deficiency Anemia  Provider:  Marshell Garfinkel MD  Procedure: Infusion  IV Type: Peripheral, IV Location: R Forearm  Venofer (Iron Sucrose), Dose: 200 mg  Infusion Start Time: 1500 pm  Infusion Stop Time: 1520 pm  Post Infusion IV Care: Observation period completed and Peripheral IV Discontinued  Discharge: Condition: Good, Destination: Home . AVS provided to patient.   Performed by:  Oren Beckmann, RN

## 2022-05-18 NOTE — Telephone Encounter (Signed)
Spoke with pt and she stated she had another bowel movement and it was brown so she did not go to the ER. Discussed with her that if she sees anymore dark stools she would need to go to the ER. She wanted to know if there could be a correlation with walking on the treadmill and her AVM's bleeding. She states when she walks 3 miles or over she has noticed some dark stools but under 3 she does not. She wanted to see what Dr. Fuller Plan thought. Please advise.

## 2022-05-19 ENCOUNTER — Other Ambulatory Visit: Payer: Self-pay

## 2022-05-19 ENCOUNTER — Telehealth: Payer: Self-pay | Admitting: *Deleted

## 2022-05-19 ENCOUNTER — Inpatient Hospital Stay: Payer: No Typology Code available for payment source

## 2022-05-19 DIAGNOSIS — D75839 Thrombocytosis, unspecified: Secondary | ICD-10-CM | POA: Diagnosis not present

## 2022-05-19 DIAGNOSIS — D500 Iron deficiency anemia secondary to blood loss (chronic): Secondary | ICD-10-CM

## 2022-05-19 LAB — CBC WITH DIFFERENTIAL (CANCER CENTER ONLY)
Abs Immature Granulocytes: 0.04 10*3/uL (ref 0.00–0.07)
Basophils Absolute: 0 10*3/uL (ref 0.0–0.1)
Basophils Relative: 0 %
Eosinophils Absolute: 0 10*3/uL (ref 0.0–0.5)
Eosinophils Relative: 0 %
HCT: 33.5 % — ABNORMAL LOW (ref 36.0–46.0)
Hemoglobin: 10.9 g/dL — ABNORMAL LOW (ref 12.0–15.0)
Immature Granulocytes: 1 %
Lymphocytes Relative: 12 %
Lymphs Abs: 0.8 10*3/uL (ref 0.7–4.0)
MCH: 34.8 pg — ABNORMAL HIGH (ref 26.0–34.0)
MCHC: 32.5 g/dL (ref 30.0–36.0)
MCV: 107 fL — ABNORMAL HIGH (ref 80.0–100.0)
Monocytes Absolute: 0.5 10*3/uL (ref 0.1–1.0)
Monocytes Relative: 7 %
Neutro Abs: 5.5 10*3/uL (ref 1.7–7.7)
Neutrophils Relative %: 80 %
Platelet Count: 539 10*3/uL — ABNORMAL HIGH (ref 150–400)
RBC: 3.13 MIL/uL — ABNORMAL LOW (ref 3.87–5.11)
RDW: 14.1 % (ref 11.5–15.5)
WBC Count: 6.9 10*3/uL (ref 4.0–10.5)
nRBC: 0 % (ref 0.0–0.2)

## 2022-05-19 LAB — SAMPLE TO BLOOD BANK

## 2022-05-19 NOTE — Telephone Encounter (Signed)
TCT patient regarding CBC done today. Spoke with patient. Advised that her HGB was 10.9 today, down just a little bit but not concerning or needing intervention. Pt voiced understanding.

## 2022-05-23 ENCOUNTER — Other Ambulatory Visit: Payer: Self-pay | Admitting: Hematology and Oncology

## 2022-05-25 ENCOUNTER — Telehealth: Payer: Self-pay | Admitting: *Deleted

## 2022-05-25 ENCOUNTER — Ambulatory Visit (INDEPENDENT_AMBULATORY_CARE_PROVIDER_SITE_OTHER): Payer: No Typology Code available for payment source

## 2022-05-25 ENCOUNTER — Other Ambulatory Visit: Payer: Self-pay | Admitting: *Deleted

## 2022-05-25 VITALS — BP 120/66 | HR 84 | Temp 98.5°F | Resp 18 | Ht 66.0 in | Wt 170.0 lb

## 2022-05-25 DIAGNOSIS — D500 Iron deficiency anemia secondary to blood loss (chronic): Secondary | ICD-10-CM

## 2022-05-25 MED ORDER — SODIUM CHLORIDE 0.9 % IV SOLN
200.0000 mg | Freq: Once | INTRAVENOUS | Status: AC
  Administered 2022-05-25: 200 mg via INTRAVENOUS
  Filled 2022-05-25: qty 10

## 2022-05-25 NOTE — Telephone Encounter (Signed)
Received call from patient. She had the last of her iron infusions today. She states she feels ok but she did have 2 small episodes of very dark heme+ stools about a week apart.  No overt bright red blood.  She would like to know what to do at this point. Advised that the best way to gauge her bleeding is with CBCs done here in the lab. Her next one is not scheduled until 06/16/22. Advised that we can check one sooner that. We agreed upon 10/9 23 @ 10:30 am for lab appt. Advised to call with any significant bleeding. Her appt with GI is not until July 19, 2022  Scheduling message sent for labs on 06/05/22 @ 10:30 am

## 2022-05-25 NOTE — Progress Notes (Signed)
Diagnosis: Iron Deficiency Anemia  Provider:  Marshell Garfinkel MD  Procedure: Infusion  IV Type: Peripheral, IV Location: R Forearm  Venofer (Iron Sucrose), Dose: 200 mg  Infusion Start Time: 1388  Infusion Stop Time: 7195  Post Infusion IV Care: Peripheral IV Discontinued  Discharge: Condition: Good, Destination: Home . AVS provided to patient.   Performed by:  Koren Shiver, RN

## 2022-06-02 ENCOUNTER — Telehealth: Payer: Self-pay | Admitting: *Deleted

## 2022-06-02 ENCOUNTER — Other Ambulatory Visit: Payer: Self-pay | Admitting: *Deleted

## 2022-06-02 NOTE — Telephone Encounter (Signed)
Received vm message from pt. She states she has completed her round of IV iron. She also states she has some lower GI bleeding yesterday. She feels fine today. She has advised her GI provider of the same. She is due for repeat labs on 06/05/22

## 2022-06-02 NOTE — Telephone Encounter (Signed)
Patient called would like to follow up and notify Dr. Hilarie Fredrickson that she had another GI bleed yesterday but it did stop.

## 2022-06-02 NOTE — Telephone Encounter (Signed)
Patient called in stating that yesterday she had one loose, maroon/dark stool & since then it has resolved. Denies any pain, states she doesn't feel badly, but will feel weak at times. She will have a CBC drawn on Monday with hematology office. She just wanted to make Dr. Fuller Plan aware of the episode since she is having it once/week. Patient advised to monitor symptoms & will be in touch if MD has any further recommendations.

## 2022-06-02 NOTE — Telephone Encounter (Signed)
Left message for patient to call back  

## 2022-06-02 NOTE — Telephone Encounter (Signed)
Recurrent maroon/dark stool noted. Continue to monitor symptoms. CBC Monday as planned.

## 2022-06-03 ENCOUNTER — Other Ambulatory Visit: Payer: Self-pay | Admitting: Hematology and Oncology

## 2022-06-04 ENCOUNTER — Encounter: Payer: Self-pay | Admitting: Hematology and Oncology

## 2022-06-05 ENCOUNTER — Inpatient Hospital Stay: Payer: No Typology Code available for payment source | Attending: Hematology and Oncology

## 2022-06-05 ENCOUNTER — Telehealth: Payer: Self-pay | Admitting: *Deleted

## 2022-06-05 DIAGNOSIS — D473 Essential (hemorrhagic) thrombocythemia: Secondary | ICD-10-CM | POA: Insufficient documentation

## 2022-06-05 DIAGNOSIS — D75839 Thrombocytosis, unspecified: Secondary | ICD-10-CM | POA: Diagnosis not present

## 2022-06-05 DIAGNOSIS — D500 Iron deficiency anemia secondary to blood loss (chronic): Secondary | ICD-10-CM

## 2022-06-05 LAB — SAMPLE TO BLOOD BANK

## 2022-06-05 LAB — CBC WITH DIFFERENTIAL (CANCER CENTER ONLY)
Abs Immature Granulocytes: 0.02 10*3/uL (ref 0.00–0.07)
Basophils Absolute: 0 10*3/uL (ref 0.0–0.1)
Basophils Relative: 1 %
Eosinophils Absolute: 0.5 10*3/uL (ref 0.0–0.5)
Eosinophils Relative: 8 %
HCT: 36.1 % (ref 36.0–46.0)
Hemoglobin: 11.8 g/dL — ABNORMAL LOW (ref 12.0–15.0)
Immature Granulocytes: 0 %
Lymphocytes Relative: 11 %
Lymphs Abs: 0.7 10*3/uL (ref 0.7–4.0)
MCH: 35.1 pg — ABNORMAL HIGH (ref 26.0–34.0)
MCHC: 32.7 g/dL (ref 30.0–36.0)
MCV: 107.4 fL — ABNORMAL HIGH (ref 80.0–100.0)
Monocytes Absolute: 0.5 10*3/uL (ref 0.1–1.0)
Monocytes Relative: 9 %
Neutro Abs: 4.3 10*3/uL (ref 1.7–7.7)
Neutrophils Relative %: 71 %
Platelet Count: 402 10*3/uL — ABNORMAL HIGH (ref 150–400)
RBC: 3.36 MIL/uL — ABNORMAL LOW (ref 3.87–5.11)
RDW: 15.8 % — ABNORMAL HIGH (ref 11.5–15.5)
WBC Count: 6 10*3/uL (ref 4.0–10.5)
nRBC: 0 % (ref 0.0–0.2)

## 2022-06-05 NOTE — Telephone Encounter (Signed)
TCT patient regarding her lab results from this morning.  Spoke with her and advised that her HGB is improving. It is 11.8 today and her platelets are better @ 402.  Pt voiced understanding. She states she feels well today and over the weekend. She is aware of her future appts.

## 2022-06-07 ENCOUNTER — Other Ambulatory Visit: Payer: No Typology Code available for payment source

## 2022-06-07 ENCOUNTER — Ambulatory Visit: Payer: No Typology Code available for payment source | Admitting: Hematology and Oncology

## 2022-06-13 ENCOUNTER — Telehealth: Payer: Self-pay | Admitting: *Deleted

## 2022-06-13 NOTE — Telephone Encounter (Signed)
Kristina Osborne is asking if Dr Lorenso Courier will consider giving her iron 2 times per month.  Dr Lorenso Courier says he gives IV Iron based on lab results, not on a scheduled basis.Is scheduled to have labs this Friday. Will determine need for iron at that time. He will be happy to check lab monthly.

## 2022-06-13 NOTE — Telephone Encounter (Signed)
Patient called back in & just wanted to make Dr. Fuller Plan aware that she is not receiving iron infusion's twice a month, as previously discussed at her last hospital admission. Per her hematologist, he prefers to continue to monitor lab work. She's due again for lab work on Friday, and then again in 4 weeks.

## 2022-06-13 NOTE — Telephone Encounter (Signed)
We defer mgmt of her anemia to her hematologist.

## 2022-06-13 NOTE — Telephone Encounter (Signed)
Patient called to clarify the plan for her & to discuss ongoing bleeding. Since we last spoke on 06/02/22 symptoms remain the same, continues to have the small bleed once a week. Most recent CBC was 11.8. She's been advised to continue to monitor her symptoms & call our office is bleeding worsen or happens more frequently. She has follow up on 07/19/22 with Dr. Fuller Plan. Pt verbalized all understanding.

## 2022-06-13 NOTE — Telephone Encounter (Signed)
Inbound call from patient stating she has been having a GI bleed once a week, patient is requesting a call to further advise.

## 2022-06-13 NOTE — Telephone Encounter (Signed)
Patient called stated that she has talked with her hematology doctor and is requesting a call back to discuss. Please advise.

## 2022-06-16 ENCOUNTER — Inpatient Hospital Stay: Payer: No Typology Code available for payment source

## 2022-06-16 ENCOUNTER — Other Ambulatory Visit: Payer: Self-pay | Admitting: *Deleted

## 2022-06-16 ENCOUNTER — Other Ambulatory Visit: Payer: Self-pay

## 2022-06-16 ENCOUNTER — Telehealth: Payer: Self-pay | Admitting: *Deleted

## 2022-06-16 DIAGNOSIS — D500 Iron deficiency anemia secondary to blood loss (chronic): Secondary | ICD-10-CM

## 2022-06-16 DIAGNOSIS — D473 Essential (hemorrhagic) thrombocythemia: Secondary | ICD-10-CM | POA: Diagnosis not present

## 2022-06-16 LAB — CBC WITH DIFFERENTIAL (CANCER CENTER ONLY)
Abs Immature Granulocytes: 0.01 10*3/uL (ref 0.00–0.07)
Basophils Absolute: 0 10*3/uL (ref 0.0–0.1)
Basophils Relative: 1 %
Eosinophils Absolute: 0 10*3/uL (ref 0.0–0.5)
Eosinophils Relative: 0 %
HCT: 34.5 % — ABNORMAL LOW (ref 36.0–46.0)
Hemoglobin: 11 g/dL — ABNORMAL LOW (ref 12.0–15.0)
Immature Granulocytes: 0 %
Lymphocytes Relative: 10 %
Lymphs Abs: 0.6 10*3/uL — ABNORMAL LOW (ref 0.7–4.0)
MCH: 34.4 pg — ABNORMAL HIGH (ref 26.0–34.0)
MCHC: 31.9 g/dL (ref 30.0–36.0)
MCV: 107.8 fL — ABNORMAL HIGH (ref 80.0–100.0)
Monocytes Absolute: 0.4 10*3/uL (ref 0.1–1.0)
Monocytes Relative: 7 %
Neutro Abs: 5 10*3/uL (ref 1.7–7.7)
Neutrophils Relative %: 82 %
Platelet Count: 383 10*3/uL (ref 150–400)
RBC: 3.2 MIL/uL — ABNORMAL LOW (ref 3.87–5.11)
RDW: 15.9 % — ABNORMAL HIGH (ref 11.5–15.5)
WBC Count: 6.1 10*3/uL (ref 4.0–10.5)
nRBC: 0 % (ref 0.0–0.2)

## 2022-06-16 NOTE — Telephone Encounter (Signed)
TCT patient regarding lab results from this morning.  Spoke to her advised that her HGB is down just a small amount @ 11. Last HGB was 11.8. Pt states she had 1 day of blood in her stool that lasted about 4 hours. She states this seems to be happening once a week.  She asked to have rechecked before she sees Dr. Fuller Plan in November. Scheduling message sent for 07/07/22. Lab orders entered.

## 2022-06-20 LAB — SAMPLE TO BLOOD BANK

## 2022-06-23 ENCOUNTER — Telehealth: Payer: Self-pay | Admitting: Gastroenterology

## 2022-06-23 ENCOUNTER — Other Ambulatory Visit: Payer: Self-pay | Admitting: Internal Medicine

## 2022-06-23 DIAGNOSIS — K5520 Angiodysplasia of colon without hemorrhage: Secondary | ICD-10-CM

## 2022-06-23 DIAGNOSIS — K922 Gastrointestinal hemorrhage, unspecified: Secondary | ICD-10-CM

## 2022-06-23 NOTE — Telephone Encounter (Signed)
Inbound call from patient inquiring if she does not eat can we perform an emergency EGD on her today . Advised patient that we would not be able to do a emerg procedure today. Patient advised understanding and states she will go ahead and have some breakfast. Patient also states she would like to speak to a nurse in regards to her having a GI bleed. Please advise.  Thank you

## 2022-06-23 NOTE — Telephone Encounter (Signed)
Patient called in stating she had another GIB that started yesterday & has continued this morning. Still having the dark stools. While we were on the phone she was also at PCP office & per patient he advised her that if bleeding were to continue into the early afternoon then she would need to go to ED. Patient is agreeable to the plan. Follow has been moved up sooner to an opening for 07/05/22 at 2:30 pm with Dr. Fuller Plan.

## 2022-06-24 ENCOUNTER — Other Ambulatory Visit: Payer: Self-pay

## 2022-06-24 ENCOUNTER — Encounter (HOSPITAL_COMMUNITY): Payer: Self-pay

## 2022-06-24 ENCOUNTER — Inpatient Hospital Stay (HOSPITAL_COMMUNITY)
Admission: EM | Admit: 2022-06-24 | Discharge: 2022-06-27 | DRG: 378 | Disposition: A | Discharge status: Home / Self Care | Payer: No Typology Code available for payment source | Attending: Student | Admitting: Student

## 2022-06-24 ENCOUNTER — Telehealth: Payer: Self-pay | Admitting: Internal Medicine

## 2022-06-24 DIAGNOSIS — I10 Essential (primary) hypertension: Secondary | ICD-10-CM | POA: Diagnosis present

## 2022-06-24 DIAGNOSIS — K921 Melena: Secondary | ICD-10-CM

## 2022-06-24 DIAGNOSIS — Z8249 Family history of ischemic heart disease and other diseases of the circulatory system: Secondary | ICD-10-CM | POA: Diagnosis not present

## 2022-06-24 DIAGNOSIS — D473 Essential (hemorrhagic) thrombocythemia: Secondary | ICD-10-CM | POA: Diagnosis present

## 2022-06-24 DIAGNOSIS — D500 Iron deficiency anemia secondary to blood loss (chronic): Secondary | ICD-10-CM | POA: Diagnosis not present

## 2022-06-24 DIAGNOSIS — Z87891 Personal history of nicotine dependence: Secondary | ICD-10-CM

## 2022-06-24 DIAGNOSIS — D539 Nutritional anemia, unspecified: Secondary | ICD-10-CM

## 2022-06-24 DIAGNOSIS — K922 Gastrointestinal hemorrhage, unspecified: Secondary | ICD-10-CM | POA: Diagnosis present

## 2022-06-24 DIAGNOSIS — D75839 Thrombocytosis, unspecified: Secondary | ICD-10-CM | POA: Diagnosis present

## 2022-06-24 DIAGNOSIS — Z9049 Acquired absence of other specified parts of digestive tract: Secondary | ICD-10-CM | POA: Diagnosis not present

## 2022-06-24 DIAGNOSIS — K2100 Gastro-esophageal reflux disease with esophagitis, without bleeding: Secondary | ICD-10-CM | POA: Diagnosis present

## 2022-06-24 DIAGNOSIS — Z886 Allergy status to analgesic agent status: Secondary | ICD-10-CM

## 2022-06-24 DIAGNOSIS — Z79899 Other long term (current) drug therapy: Secondary | ICD-10-CM | POA: Diagnosis not present

## 2022-06-24 DIAGNOSIS — D126 Benign neoplasm of colon, unspecified: Secondary | ICD-10-CM

## 2022-06-24 DIAGNOSIS — K31819 Angiodysplasia of stomach and duodenum without bleeding: Secondary | ICD-10-CM | POA: Diagnosis present

## 2022-06-24 DIAGNOSIS — D509 Iron deficiency anemia, unspecified: Secondary | ICD-10-CM | POA: Diagnosis present

## 2022-06-24 DIAGNOSIS — K5520 Angiodysplasia of colon without hemorrhage: Secondary | ICD-10-CM | POA: Diagnosis not present

## 2022-06-24 DIAGNOSIS — K635 Polyp of colon: Secondary | ICD-10-CM | POA: Diagnosis not present

## 2022-06-24 DIAGNOSIS — Z882 Allergy status to sulfonamides status: Secondary | ICD-10-CM

## 2022-06-24 DIAGNOSIS — K5521 Angiodysplasia of colon with hemorrhage: Secondary | ICD-10-CM | POA: Diagnosis present

## 2022-06-24 DIAGNOSIS — D649 Anemia, unspecified: Secondary | ICD-10-CM | POA: Diagnosis not present

## 2022-06-24 DIAGNOSIS — D62 Acute posthemorrhagic anemia: Secondary | ICD-10-CM | POA: Diagnosis present

## 2022-06-24 LAB — URINALYSIS, ROUTINE W REFLEX MICROSCOPIC
Bilirubin Urine: NEGATIVE
Glucose, UA: NEGATIVE mg/dL
Hgb urine dipstick: NEGATIVE
Ketones, ur: NEGATIVE mg/dL
Leukocytes,Ua: NEGATIVE
Nitrite: NEGATIVE
Protein, ur: NEGATIVE mg/dL
Specific Gravity, Urine: >1.03 — ABNORMAL HIGH (ref 1.005–1.030)
pH: 5.5 (ref 5.0–8.0)

## 2022-06-24 LAB — COMPREHENSIVE METABOLIC PANEL
ALT: 20 U/L (ref 0–44)
AST: 24 U/L (ref 15–41)
Albumin: 4.2 g/dL (ref 3.5–5.0)
Alkaline Phosphatase: 57 U/L (ref 38–126)
Anion gap: 5 (ref 5–15)
BUN: 15 mg/dL (ref 8–23)
CO2: 28 mmol/L (ref 22–32)
Calcium: 9.4 mg/dL (ref 8.9–10.3)
Chloride: 106 mmol/L (ref 98–111)
Creatinine, Ser: 0.77 mg/dL (ref 0.44–1.00)
GFR, Estimated: >60 mL/min (ref >60)
Glucose, Bld: 115 mg/dL — ABNORMAL HIGH (ref 70–99)
Potassium: 4.2 mmol/L (ref 3.5–5.1)
Sodium: 139 mmol/L (ref 135–145)
Total Bilirubin: 0.6 mg/dL (ref 0.3–1.2)
Total Protein: 6.5 g/dL (ref 6.5–8.1)

## 2022-06-24 LAB — CBC WITH DIFFERENTIAL/PLATELET
Abs Immature Granulocytes: 0.01 10*3/uL (ref 0.00–0.07)
Basophils Absolute: 0 10*3/uL (ref 0.0–0.1)
Basophils Relative: 1 %
Eosinophils Absolute: 0 10*3/uL (ref 0.0–0.5)
Eosinophils Relative: 0 %
HCT: 33.1 % — ABNORMAL LOW (ref 36.0–46.0)
Hemoglobin: 10.5 g/dL — ABNORMAL LOW (ref 12.0–15.0)
Immature Granulocytes: 0 %
Lymphocytes Relative: 13 %
Lymphs Abs: 0.7 10*3/uL (ref 0.7–4.0)
MCH: 35.2 pg — ABNORMAL HIGH (ref 26.0–34.0)
MCHC: 31.7 g/dL (ref 30.0–36.0)
MCV: 111.1 fL — ABNORMAL HIGH (ref 80.0–100.0)
Monocytes Absolute: 0.3 10*3/uL (ref 0.1–1.0)
Monocytes Relative: 6 %
Neutro Abs: 4.3 10*3/uL (ref 1.7–7.7)
Neutrophils Relative %: 80 %
Platelets: 337 10*3/uL (ref 150–400)
RBC: 2.98 MIL/uL — ABNORMAL LOW (ref 3.87–5.11)
RDW: 16 % — ABNORMAL HIGH (ref 11.5–15.5)
WBC: 5.4 10*3/uL (ref 4.0–10.5)
nRBC: 0 % (ref 0.0–0.2)

## 2022-06-24 LAB — PROTIME-INR
INR: 1 (ref 0.8–1.2)
Prothrombin Time: 13.3 seconds (ref 11.4–15.2)

## 2022-06-24 LAB — POC OCCULT BLOOD, ED: Fecal Occult Bld: POSITIVE — AB

## 2022-06-24 LAB — LIPASE, BLOOD: Lipase: 47 U/L (ref 11–51)

## 2022-06-24 MED ORDER — PANTOPRAZOLE SODIUM 40 MG IV SOLR: 40.0000 mg | Freq: Two times a day (BID) | INTRAVENOUS | Status: DC

## 2022-06-24 MED ORDER — PROCHLORPERAZINE EDISYLATE 10 MG/2ML IJ SOLN: 5.0000 mg | Freq: Four times a day (QID) | INTRAMUSCULAR | Status: DC | PRN

## 2022-06-24 MED ORDER — ACETAMINOPHEN 325 MG PO TABS
650.0000 mg | ORAL_TABLET | Freq: Four times a day (QID) | ORAL | Status: DC | PRN
  Administered 2022-06-25 – 2022-06-26 (×4): 650 mg via ORAL
  Filled 2022-06-24 (×4): qty 2

## 2022-06-24 MED ORDER — FOLIC ACID 1 MG PO TABS
1.0000 mg | ORAL_TABLET | Freq: Every day | ORAL | Status: DC
  Administered 2022-06-24 – 2022-06-27 (×3): 1 mg via ORAL
  Filled 2022-06-24 (×3): qty 1

## 2022-06-24 MED ORDER — SODIUM CHLORIDE 0.9 % IV SOLN: INTRAVENOUS | Status: AC

## 2022-06-24 MED ORDER — SODIUM CHLORIDE 0.9 % IV BOLUS
1000.0000 mL | Freq: Once | INTRAVENOUS | Status: AC
  Administered 2022-06-24: 1000 mL via INTRAVENOUS

## 2022-06-24 MED ORDER — MELATONIN 5 MG PO TABS
5.0000 mg | ORAL_TABLET | Freq: Every evening | ORAL | Status: DC | PRN
  Administered 2022-06-25: 5 mg via ORAL
  Filled 2022-06-24: qty 1

## 2022-06-24 MED ORDER — PANTOPRAZOLE SODIUM 40 MG IV SOLR
40.0000 mg | Freq: Once | INTRAVENOUS | Status: AC
  Administered 2022-06-24: 40 mg via INTRAVENOUS
  Filled 2022-06-24: qty 10

## 2022-06-24 MED ORDER — ADULT MULTIVITAMIN W/MINERALS CH
1.0000 | ORAL_TABLET | Freq: Every day | ORAL | Status: DC
  Administered 2022-06-24 – 2022-06-27 (×3): 1 via ORAL
  Filled 2022-06-24 (×3): qty 1

## 2022-06-24 NOTE — ED Triage Notes (Signed)
Patient said she is having dark bloody stools since Thursday. 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 Slight dizziness and more tired when she walks. Sitting she feels ok.

## 2022-06-24 NOTE — H&P (Signed)
History and Physical  Kristina Osborne VQQ:[id number] DOB: 02/28/1951 DOA: 06/24/2022  Referring physician: Oren Binet, PA-EDP  PCP: Biagio Borg, MD  Outpatient Specialists: [facility]. Patient coming from: Home.  Chief Complaint: Dark tarry stools.  HPI: Kristina Osborne is a 71 y.o. 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  Initially started on Thursday as maroon stools which alarmed her that she was having recurrent GI bleed.  The following day she had dark tarry stools.  She thought it would go away, but this morning she had more melanotic stools.  Denies use of aspirin or NSAIDs.  No use of alcohol.  No nausea or abdominal pain.  Endorses exertional dyspnea and palpitations with ambulation.  She presented to the ED for further evaluation.  In the ED, the patient had recurrent large black tarry stools.  Work-up revealed acute blood loss anemia with positive FOBT and hemoglobin drop of 10.5 from baseline of 13.  The patient received a dose of IV Protonix 40 mg x 1 and 1 L IV fluid bolus normal saline in the ED.  TR[facility], hospitalist service, was asked to admit.  ED Course: Tmax 99.  BP 108/63, pulse 77, respiration rate 16, O2 saturation 100% on room air.  Lab studies significant for hemoglobin 10.5, WBC 5.4, platelet count 337.  Review of Systems: Review of systems as noted in the HPI. All other systems reviewed and are negative.   Past Medical History:  Diagnosis Date   ABLA (acute blood loss anemia) 04/16/2022   Congenital malformation of intestinal fixation    Gastritis    Gastroesophageal reflux disease without esophagitis 09/10/2021   IDA (iron deficiency  anemia)    Mitral valve prolapse    Thrombocytosis    Past Surgical History:  Procedure Laterality Date   COLONOSCOPY N/A 04/17/2022   Procedure: COLONOSCOPY;  Surgeon: Jerene Bears, MD;  Location: [facility] ENDOSCOPY;  Service: Gastroenterology;  Laterality: N/A;   ENTEROSCOPY N/A 04/17/2022   Procedure: ENTEROSCOPY;  Surgeon: Jerene Bears, MD;  Location: [facility] ENDOSCOPY;  Service: Gastroenterology;  Laterality: N/A;   HEMOSTASIS CLIP PLACEMENT  04/17/2022   Procedure: HEMOSTASIS CLIP PLACEMENT;  Surgeon: Jerene Bears, MD;  Location: [facility]OSCOPY;  Service: Gastroenterology;;   HOT HEMOSTASIS N/A 04/17/2022   Procedure: HOT HEMOSTASIS (ARGON PLASMA COAGULATION/BICAP);  Surgeon: Jerene Bears, MD;  Location: [facility];  Service: Gastroenterology;  Laterality: N/A;   POLYPECTOMY  04/17/2022   Procedure: POLYPECTOMY;  Surgeon: Jerene Bears, MD;  Location: [facility];  Service: Gastroenterology;;   SMALL INTESTINE SURGERY  1994    Social History:  reports that she has quit smoking. Her smoking use included cigarettes. She has never used smokeless tobacco. She reports that she does not currently use alcohol. She reports that she does not use drugs.   Allergies  Allergen Reactions   Aspirin Other (See Comments)    "Due to intestinal issue"   Sulfa Antibiotics     Family History  Problem Relation Age of Onset   Heart disease Mother    Valvular heart disease Mother    Healthy Sister    Alcoholism Half-Sister       Prior to Admission medications   Medication Sig Start Date End  Date Taking? Authorizing Provider  Ascorbic Acid (VITAMIN C) 1000 MG tablet Take 500 mg by mouth in the morning and at bedtime.    [provider]  cholecalciferol (VITAMIN D3) 25 MCG (1000 UNIT) tablet Take 1,000 Units by mouth daily.    [provider]  famotidine (PEPCID) 40 MG tablet Take 1 tablet (40 mg total) by mouth daily. 09/20/21   Ladene Artist, MD  folic acid (FOLVITE) 1 MG tablet  TAKE 1 TABLET BY MOUTH EVERY DAY 06/04/22   Orson Slick, MD  glucosamine-chondroitin 500-400 MG tablet Take 1 tablet by mouth daily.    [provider]  hydroxyurea (HYDREA) 500 MG capsule TAKE 1 CAPSULE BY MOUTH TWICE A DAY 05/23/22   Orson Slick, MD  methocarbamol (ROBAXIN) 500 MG tablet Take 1 tablet (500 mg total) by mouth every 6 (six) hours as needed for muscle spasms. 01/20/22   Biagio Borg, MD  Methylsulfonylmethane (MSM PO) Take 1 capsule by mouth daily.    [provider]  metoprolol tartrate (LOPRESSOR) 25 MG tablet Take 12.5 mg by mouth 2 (two) times daily. 04/17/22   [provider]  Multiple Vitamins-Minerals (MULTIVITAMIN ADULTS 50+) TABS Take 1 tablet by mouth daily.    [provider]  sucralfate (CARAFATE) 1 g tablet Take 1 tablet (1 g total) by mouth 4 (four) times daily -  with meals and at bedtime. Patient taking differently: Take 1 g by mouth 2 (two) times daily. 09/20/21 04/20/22  Ladene Artist, MD    Physical Exam: BP 108/63   Pulse 77   Temp 98.8 F (37.1 C)   Resp 16   Ht '5\' 6"'$  (1.676 m)   Wt 77.1 kg   LMP  (LMP Unknown)   SpO2 100%   BMI 27.44 kg/m   General: 71 y.o. year-old female well developed well nourished in no acute distress.  Alert and oriented x3. Cardiovascular: Regular rate and rhythm with no rubs or gallops.  No thyromegaly or JVD noted.  No lower extremity edema. 2/4 pulses in all 4 extremities. Respiratory: Clear to auscultation with no wheezes or rales. Good inspiratory effort. Abdomen: Soft nontender nondistended with normal bowel sounds x4 quadrants. Muskuloskeletal: No cyanosis, clubbing or edema noted bilaterally Neuro: CN II-XII intact, strength, sensation, reflexes Skin: No ulcerative lesions noted or rashes Psychiatry: Judgement and insight appear normal. Mood is appropriate for condition and setting          Labs on Admission:  Basic Metabolic Panel: Recent Labs  Lab 06/24/22 1421   NA 139  K 4.2  CL 106  CO2 28  GLUCOSE 115*  BUN 15  CREATININE 0.77  CALCIUM 9.4   Liver Function Tests: Recent Labs  Lab 06/24/22 1421  AST 24  ALT 20  ALKPHOS 57  BILITOT 0.6  PROT 6.5  ALBUMIN 4.2   Recent Labs  Lab 06/24/22 1421  LIPASE 47   No results for input(s): "AMMONIA" in the last 168 hours. CBC: Recent Labs  Lab 06/24/22 1421  WBC 5.4  NEUTROABS 4.3  HGB 10.5*  HCT 33.1*  MCV 111.1*  PLT 337   Cardiac Enzymes: No results for input(s): "CKTOTAL", "CKMB", "CKMBINDEX", "TROPONINI" in the last 168 hours.  BNP (last 3 results) Recent Labs    04/15/22 1652  BNP 82.3    ProBNP (last 3 results) No results for input(s): "PROBNP" in the last 8760 hours.  CBG: No results for input(s): "GLUCAP" in the  last 168 hours.  Radiological Exams on Admission: No results found.  EKG: I independently viewed the EKG done and my findings are as followed: None available at the time of this dictation.  Assessment/Plan Present on Admission:  GI bleed  Principal Problem:   GI bleed  GI bleed with history of AVMs Presented with complaints of initially maroon stools, then persistent black tarry stools Drop in hemoglobin, and positive FOBT Followed by [facility] outpatient Recent enteroscopy and colonoscopy in August 2023, with findings as stated above. Serial H&H every 6 hours Started IV Protonix in the ED, continue IV Protonix 40 mg twice daily IV fluid hydration, normal saline at 75 cc/h x 2 days. Transfuse with hemoglobin less than 8 with ongoing GI bleed, the patient consented to PRBCs blood transfusion. Maintain MAP greater than 65 Clear liquid diet now, n.p.o. after midnight until seen by GI.  Acute blood loss anemia in the setting of GI bleed Baseline hemoglobin appears to be 13 Presented with hemoglobin of 10.5. Monitor H&H every 6 hours Transfuse as indicated Maintain MAP greater than 65, monitor for hemorrhagic shock  Essential  thrombocythemia on hydroxyurea On hydroxyurea prior to admission Follows with medical oncology outpatient.  Essential hypertension BPs are soft Hold off home oral antihypertensives-states she no longer takes metoprolol. Closely monitor vital signs.     DVT prophylaxis: SCDs.  Chemical DVT prophylaxis contraindicated due to concern for GI bleed  Code Status: Full code  Family Communication: None at bedside.  Disposition Plan: Admitted to progressive care unit  Consults called: GI.  Admission status: Inpatient status.   Status is: Inpatient The patient requires at least 2 midnights for further evaluation and treatment of present condition.   Kayleen Memos MD [facility] Pager [phone]  If 7PM-7AM, please contact night-coverage [email or web address] Password [facility]  06/24/2022, 9:29 PM

## 2022-06-24 NOTE — ED Provider Notes (Signed)
[facility] DEPT Provider Note   CSN: [id number] Arrival date & time: 06/24/22  1400     History  Chief Complaint  Patient presents with   Melena    Kristina Osborne is a 71 y.o. female with a history of AVM, who presents to the ED with concerns for melena onset 3 days. Patient notes that she was recently hospitalized for similar concerns in August.  Her GI specialist is through [facility].  Patient is also on iron supplementation due to the anemia that was noted during her hospital admission.  Notes that she feels like her heart beats faster with ambulation.  No additional meds tried prior to arrival. Denies chest pain, shortness of breath, abdominal pain, nausea, vomiting. Hasn't taken pepto bismol. No NSAIDs, ETOH, or anticoagulants.   The history is provided by the patient. No language interpreter was used.       Home Medications Prior to Admission medications   Medication Sig Start Date End Date Taking? Authorizing Provider  Ascorbic Acid (VITAMIN C) 1000 MG tablet Take 500 mg by mouth in the morning and at bedtime.    [provider]  cholecalciferol (VITAMIN D3) 25 MCG (1000 UNIT) tablet Take 1,000 Units by mouth daily.    [provider]  famotidine (PEPCID) 40 MG tablet Take 1 tablet (40 mg total) by mouth daily. 09/20/21   Ladene Artist, MD  folic acid (FOLVITE) 1 MG tablet TAKE 1 TABLET BY MOUTH EVERY DAY 06/04/22   Orson Slick, MD  glucosamine-chondroitin 500-400 MG tablet Take 1 tablet by mouth daily.    [provider]  hydroxyurea (HYDREA) 500 MG capsule TAKE 1 CAPSULE BY MOUTH TWICE A DAY 05/23/22   Orson Slick, MD  methocarbamol (ROBAXIN) 500 MG tablet Take 1 tablet (500 mg total) by mouth every 6 (six) hours as needed for muscle spasms. 01/20/22   Biagio Borg, MD  Methylsulfonylmethane (MSM PO) Take 1 capsule by mouth daily.    [provider]  metoprolol tartrate (LOPRESSOR) 25 MG tablet  Take 12.5 mg by mouth 2 (two) times daily. 04/17/22   [provider]  Multiple Vitamins-Minerals (MULTIVITAMIN ADULTS 50+) TABS Take 1 tablet by mouth daily.    [provider]  sucralfate (CARAFATE) 1 g tablet Take 1 tablet (1 g total) by mouth 4 (four) times daily -  with meals and at bedtime. Patient taking differently: Take 1 g by mouth 2 (two) times daily. 09/20/21 04/20/22  Ladene Artist, MD      Allergies    Aspirin and Sulfa antibiotics    Review of Systems   Review of Systems  All other systems reviewed and are negative.   Physical Exam Updated Vital Signs BP (!) 124/105   Pulse 87   Temp 98.8 F (37.1 C)   Resp 16   Ht '5\' 6"'$  (1.676 m)   Wt 77.1 kg   LMP  (LMP Unknown)   SpO2 100%   BMI 27.44 kg/m  Physical Exam Vitals and nursing note reviewed. Exam conducted with a chaperone present.  Constitutional:      General: She is not in acute distress.    Appearance: She is not diaphoretic.  HENT:     Head: Normocephalic and atraumatic.     Mouth/Throat:     Pharynx: No oropharyngeal exudate.  Eyes:     General: No scleral icterus.    Conjunctiva/sclera: Conjunctivae normal.  Cardiovascular:     Rate  and Rhythm: Normal rate and regular rhythm.     Pulses: Normal pulses.     Heart sounds: Normal heart sounds.  Pulmonary:     Effort: Pulmonary effort is normal. No respiratory distress.     Breath sounds: Normal breath sounds. No wheezing.  Abdominal:     General: Bowel sounds are normal.     Palpations: Abdomen is soft. There is no mass.     Tenderness: There is no abdominal tenderness. There is no guarding or rebound.  Genitourinary:    Rectum: Guaiac result positive. No mass, tenderness, anal fissure or external hemorrhoid. Normal anal tone.     Comments: RN chaperone present for exam. No external hemorrhoids noted on exam. No anal fissures noted. No gross blood noted on rectal exam. Normal rectal tone.  Musculoskeletal:        General:  Normal range of motion.     Cervical back: Normal range of motion and neck supple.  Skin:    General: Skin is warm and dry.  Neurological:     Mental Status: She is alert.  Psychiatric:        Behavior: Behavior normal.     ED Results / Procedures / Treatments   Labs (all labs ordered are listed, but only abnormal results are displayed) Labs Reviewed  CBC WITH DIFFERENTIAL/PLATELET - Abnormal; Notable for the following components:      Result Value   RBC 2.98 (*)    Hemoglobin 10.5 (*)    HCT 33.1 (*)    MCV 111.1 (*)    MCH 35.2 (*)    RDW 16.0 (*)    All other components within normal limits  COMPREHENSIVE METABOLIC PANEL - Abnormal; Notable for the following components:   Glucose, Bld 115 (*)    All other components within normal limits  POC OCCULT BLOOD, ED - Abnormal; Notable for the following components:   Fecal Occult Bld POSITIVE (*)    All other components within normal limits  LIPASE, BLOOD  URINALYSIS, ROUTINE W REFLEX MICROSCOPIC  PROTIME-INR    EKG None  Radiology No results found.  Procedures Procedures    Medications Ordered in ED Medications  sodium chloride 0.9 % bolus 1,000 mL (1,000 mLs Intravenous New Bag/Given 06/24/22 2109)  pantoprazole (PROTONIX) injection 40 mg (40 mg Intravenous Given 06/24/22 2109)    ED Course/ Medical Decision Making/ A&P Clinical Course as of 06/24/22 2127  Sat Jun 24, 2022  2054 Fecal Occult Blood, POC(!): POSITIVE [SB]  2108 Discussed with patient plans for admission. Pt agreeable to admission at this time. Pt appears safe for admission. [SB]    Clinical Course User Index [SB] Rhonna Holster A, PA-C                           Medical Decision Making Amount and/or Complexity of Data Reviewed Labs: ordered. Decision-making details documented in ED Course.  Risk Prescription drug management. Decision regarding hospitalization.   Pt presents with concerns for melena onset 3 days.  Patient afebrile, not  tachycardic or hypoxic.  On exam patient without acute cardiovascular, respiratory, down exam findings. RN chaperone present for exam. No external hemorrhoids noted on exam. No anal fissures noted. No gross blood noted on rectal exam. Normal rectal tone. Differential diagnosis includes upper GI bleed, peptic ulcer disease, varices, external hemorrhoid, anal fissure.    Additional history obtained:  External records from outside source obtained and reviewed including: Patient was admitted  to the hospital in August 2023 for similar symptoms.  At that time had an endoscopy and colonoscopy completed by [facility].  Was discharged home and started on iron supplementation.  Labs:  I ordered, and personally interpreted labs.  The pertinent results include:   Hemoccult positive Lipase unremarkable. CBC with hemoglobin downtrending at 10.5 (on 06/05/2022, hemoglobin was 11.5). CMP unremarkable.    Medications:  I ordered medication including IVF and Protonix for symptom management I have reviewed the patients home medicines and have made adjustments as needed  Consultations: I requested consultation with the Hospitalist, and discussed lab and imaging findings as well as pertinent plan - they recommend: admission to the hospital Secure chat sent to on-call [facility] specialist, Dr. Hilarie Fredrickson regarding patient case.  Disposition: Presentation suspicious for upper GI bleed.  Doubt external hemorrhoids or anal fissures at this time. After consideration of the diagnostic results and the patients response to treatment, I feel that the patient would benefit from Admission to the hospital. Discussed with patient plans for admission. Pt agreeable at this time. Pt appears safe for discharge at this time.   Supportive care measures and strict return precautions discussed with patient at bedside. Pt acknowledges and verbalizes understanding. Pt appears safe for discharge. Follow up as indicated in discharge  paperwork.    This chart was dictated using voice recognition software, Dragon. Despite the best efforts of this provider to proofread and correct errors, errors may still occur which can change documentation meaning.   Final Clinical Impression(s) / ED Diagnoses Final diagnoses:  Melena    Rx / DC Orders ED Discharge Orders     None         Kiesha Ensey A, PA-C 06/24/22 2128    Regan Lemming, MD 06/24/22 2235

## 2022-06-24 NOTE — ED Provider Triage Note (Signed)
Emergency Medicine Provider Triage Evaluation Note  Kristina Osborne , a 71 y.o. female  was evaluated in triage.  Pt complains of melena  onset 3 days. Has a history of AVM. Patient notes that she was recently hospitalized for similar concerns in August.  Her GI specialist is through [facility].  Patient is also on iron supplementation due to the anemia that was noted during her hospital admission.  Notes that she feels like her heart beats faster with ambulation.  No additional meds tried prior to arrival. Denies chest pain, shortness of breath, abdominal pain, nausea, vomiting. Hasn't taken pepto bismol.   Review of Systems  Positive:  Negative:   Physical Exam  BP 124/64 (BP Location: Right Arm)   Pulse 94   Temp 99 F (37.2 C) (Oral)   Resp 15   Ht '5\' 6"'$  (1.676 m)   Wt 77.1 kg   LMP  (LMP Unknown)   SpO2 98%   BMI 27.44 kg/m  Gen:   Awake, no distress   Resp:  Normal effort  MSK:   Moves extremities without difficulty  Other:    Medical Decision Making  Medically screening exam initiated at 2:15 PM.  Appropriate orders placed.  Kristina Osborne was informed that the remainder of the evaluation will be completed by another provider, this initial triage assessment does not replace that evaluation, and the importance of remaining in the ED until their evaluation is complete.  Work-up initiated.    Kristina Osborne A, PA-C 06/24/22 1434

## 2022-06-24 NOTE — Telephone Encounter (Signed)
Patient contacted me this evening as the on-call provider  She is in the [facility] long emergency waiting room where she has been having on and off maroon and black stools Known to [place], had enteroscopy and colonoscopy in August.  Angioectasias in the jejunum but also fine lacy angioectasias/telangiectatic vessels throughout the colon.  Hemoglobin is 10.5 which is down to half a gram in the last week but about 1 g in the last 2 to 3 weeks.  I told her that she is in the right place and should wait for full ER evaluation.  In the event she is admitted GI consult team is happy to consult.  Time provided for questions and answers and she thanked me for the call

## 2022-06-25 DIAGNOSIS — D62 Acute posthemorrhagic anemia: Secondary | ICD-10-CM

## 2022-06-25 DIAGNOSIS — D75839 Thrombocytosis, unspecified: Secondary | ICD-10-CM

## 2022-06-25 DIAGNOSIS — K5520 Angiodysplasia of colon without hemorrhage: Secondary | ICD-10-CM

## 2022-06-25 DIAGNOSIS — D539 Nutritional anemia, unspecified: Secondary | ICD-10-CM | POA: Diagnosis not present

## 2022-06-25 DIAGNOSIS — K5521 Angiodysplasia of colon with hemorrhage: Principal | ICD-10-CM

## 2022-06-25 DIAGNOSIS — K921 Melena: Secondary | ICD-10-CM | POA: Diagnosis not present

## 2022-06-25 DIAGNOSIS — D649 Anemia, unspecified: Secondary | ICD-10-CM

## 2022-06-25 LAB — COMPREHENSIVE METABOLIC PANEL
ALT: 16 U/L (ref 0–44)
AST: 21 U/L (ref 15–41)
Albumin: 3.4 g/dL — ABNORMAL LOW (ref 3.5–5.0)
Alkaline Phosphatase: 44 U/L (ref 38–126)
Anion gap: 3 — ABNORMAL LOW (ref 5–15)
BUN: 15 mg/dL (ref 8–23)
CO2: 23 mmol/L (ref 22–32)
Calcium: 8.6 mg/dL — ABNORMAL LOW (ref 8.9–10.3)
Chloride: 113 mmol/L — ABNORMAL HIGH (ref 98–111)
Creatinine, Ser: 0.56 mg/dL (ref 0.44–1.00)
GFR, Estimated: >60 mL/min (ref >60)
Glucose, Bld: 105 mg/dL — ABNORMAL HIGH (ref 70–99)
Potassium: 3.5 mmol/L (ref 3.5–5.1)
Sodium: 139 mmol/L (ref 135–145)
Total Bilirubin: 0.4 mg/dL (ref 0.3–1.2)
Total Protein: 5.3 g/dL — ABNORMAL LOW (ref 6.5–8.1)

## 2022-06-25 LAB — IRON AND TIBC
Iron: 22 ug/dL — ABNORMAL LOW (ref 28–170)
Saturation Ratios: 7 % — ABNORMAL LOW (ref 10.4–31.8)
TIBC: 334 ug/dL (ref 250–450)
UIBC: 312 ug/dL

## 2022-06-25 LAB — CBC WITH DIFFERENTIAL/PLATELET
Abs Immature Granulocytes: 0.01 10*3/uL (ref 0.00–0.07)
Basophils Absolute: 0 10*3/uL (ref 0.0–0.1)
Basophils Relative: 1 %
Eosinophils Absolute: 0 10*3/uL (ref 0.0–0.5)
Eosinophils Relative: 0 %
HCT: 28.5 % — ABNORMAL LOW (ref 36.0–46.0)
Hemoglobin: 8.9 g/dL — ABNORMAL LOW (ref 12.0–15.0)
Immature Granulocytes: 0 %
Lymphocytes Relative: 16 %
Lymphs Abs: 0.7 10*3/uL (ref 0.7–4.0)
MCH: 35.3 pg — ABNORMAL HIGH (ref 26.0–34.0)
MCHC: 31.2 g/dL (ref 30.0–36.0)
MCV: 113.1 fL — ABNORMAL HIGH (ref 80.0–100.0)
Monocytes Absolute: 0.4 10*3/uL (ref 0.1–1.0)
Monocytes Relative: 9 %
Neutro Abs: 3.3 10*3/uL (ref 1.7–7.7)
Neutrophils Relative %: 74 %
Platelets: 248 10*3/uL (ref 150–400)
RBC: 2.52 MIL/uL — ABNORMAL LOW (ref 3.87–5.11)
RDW: 16.1 % — ABNORMAL HIGH (ref 11.5–15.5)
WBC: 4.4 10*3/uL (ref 4.0–10.5)
nRBC: 0 % (ref 0.0–0.2)

## 2022-06-25 LAB — FERRITIN: Ferritin: 21 ng/mL (ref 11–307)

## 2022-06-25 LAB — PHOSPHORUS: Phosphorus: 3.1 mg/dL (ref 2.5–4.6)

## 2022-06-25 LAB — HEMOGLOBIN AND HEMATOCRIT, BLOOD
HCT: 30.1 % — ABNORMAL LOW (ref 36.0–46.0)
HCT: 28.8 % — ABNORMAL LOW (ref 36.0–46.0)
Hemoglobin: 9.4 g/dL — ABNORMAL LOW (ref 12.0–15.0)
Hemoglobin: 9 g/dL — ABNORMAL LOW (ref 12.0–15.0)

## 2022-06-25 LAB — VITAMIN B12: Vitamin B-12: 258 pg/mL (ref 180–914)

## 2022-06-25 LAB — MAGNESIUM: Magnesium: 2.1 mg/dL (ref 1.7–2.4)

## 2022-06-25 LAB — FOLATE: Folate: >40 ng/mL (ref >5.9)

## 2022-06-25 MED ORDER — PEG-KCL-NACL-NASULF-NA ASC-C 100 G PO SOLR
0.5000 | Freq: Once | ORAL | Status: AC
  Administered 2022-06-26: 100 g via ORAL

## 2022-06-25 MED ORDER — PEG-KCL-NACL-NASULF-NA ASC-C 100 G PO SOLR
0.5000 | Freq: Once | ORAL | Status: AC
  Administered 2022-06-25: 100 g via ORAL
  Filled 2022-06-25: qty 1

## 2022-06-25 MED ORDER — PANTOPRAZOLE SODIUM 40 MG PO TBEC: 40.0000 mg | DELAYED_RELEASE_TABLET | Freq: Every day | ORAL | Status: DC

## 2022-06-25 MED ORDER — PEG-KCL-NACL-NASULF-NA ASC-C 100 G PO SOLR: 1.0000 | Freq: Once | ORAL | Status: DC

## 2022-06-25 MED ORDER — METOCLOPRAMIDE HCL 5 MG/ML IJ SOLN
10.0000 mg | Freq: Four times a day (QID) | INTRAMUSCULAR | Status: AC
  Administered 2022-06-25 (×2): 10 mg via INTRAVENOUS
  Filled 2022-06-25 (×2): qty 2

## 2022-06-25 MED ORDER — PANTOPRAZOLE SODIUM 40 MG IV SOLR: 40.0000 mg | Freq: Two times a day (BID) | INTRAVENOUS | Status: DC

## 2022-06-25 MED ORDER — PANTOPRAZOLE SODIUM 40 MG IV SOLR
40.0000 mg | Freq: Two times a day (BID) | INTRAVENOUS | Status: DC
  Administered 2022-06-25 – 2022-06-27 (×5): 40 mg via INTRAVENOUS
  Filled 2022-06-25 (×6): qty 10

## 2022-06-25 MED ORDER — HYDROXYUREA 500 MG PO CAPS: 500.0000 mg | ORAL_CAPSULE | Freq: Two times a day (BID) | ORAL | Status: DC

## 2022-06-25 NOTE — Plan of Care (Signed)
  Problem: Education: Goal: Knowledge of General Education information will improve Description: Including pain rating scale, medication(s)/side effects and non-pharmacologic comfort measures Outcome: Progressing   Problem: Coping: Goal: Level of anxiety will decrease Outcome: Progressing   Problem: Safety: Goal: Ability to remain free from injury will improve Outcome: Progressing   

## 2022-06-25 NOTE — Progress Notes (Signed)
PROGRESS NOTE  Kristina Osborne STM:[id number] DOB: [birth year]   PCP: Biagio Borg, MD  Patient is from: Home.  Independent at baseline.  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  Hgb 10.5. Hemoccult positive.  GI consulted.  Plan for small bowel enteroscopy and colonoscopy on 10/30, then possible video capsule endoscopy  Subjective: Seen and examined earlier this morning.  No major events overnight or this morning.  Had normal-looking small bowel movement this morning.  Reports lightheadedness when she gets up.  She denies chest pain or shortness of breath.  Denies NSAID use.  Objective: Vitals:   06/24/22 2238 06/25/22 0230 06/25/22 0634 06/25/22 0911  BP: (!) 98/57 (!) 95/51 (!) 98/57 93/61  Pulse: 88 81 80 70  Resp: _0 Temp: 97.9 F (36.6 C) 97.8 F (36.6 C) 97.7 F (36.5 C) 98 F (36.7 C)  TempSrc: Oral Oral Oral Oral  SpO2: 100% 94% 98% 98%  Weight:      Height:        Examination:  GENERAL: No apparent distress.  Nontoxic. HEENT: MMM.  Vision and hearing grossly intact.  NECK: Supple.  No apparent JVD.  RESP:  No IWOB.  Fair aeration bilaterally. CVS:  RRR. Heart sounds normal.  ABD/GI/GU: BS+. Abd soft, NTND.  MSK/EXT:  Moves extremities. No apparent deformity. No edema.  SKIN: no apparent skin lesion or wound NEURO: Awake, alert and oriented appropriately.  No apparent focal neuro deficit. PSYCH: Calm. Normal affect.   Procedures:  None  Microbiology summarized: None  Assessment and plan: Principal Problem:   GI bleed Active Problems:   Thrombocytosis   AVM (arteriovenous malformation) of small bowel, acquired    ABLA (acute blood loss anemia)   Symptomatic anemia   Macrocytic anemia  Symptomatic ABLA due to recurrent GI bleed with history of AVMs: Bleeding seems to have subsided.  Ports normal looking stool this morning Recent Labs    04/18/22 1437 04/19/22 0436 04/26/22 1131 05/05/22 1149 05/19/22 1137 06/05/22 1043 06/16/22 1123 06/24/22 1421 06/25/22 0350 06/25/22 1012  HGB 8.3* 8.2* 10.6* 11.3* 10.9* 11.8* 11.0* 10.5* 8.9* 9.4*  -Check anemia panel -Monitor H&H -Start IV Protonix twice daily -GI planning small bowel enteroscopy and colonoscopy on 10/30, then possible video capsule endoscopy -SCD for VTE prophylaxis  Macrocytic anemia -Check G92 and folic acid     Essential thrombocythemia on hydroxyurea -Resume home hydroxyurea   Essential hypertension: Normotensive.  Not on meds.  Body mass index is 27.44 kg/m.           DVT prophylaxis:  SCDs Start: 06/24/22 2126  Code Status: Full code Family Communication: None at bedside Level of care: Telemetry Status is: Inpatient Remains inpatient appropriate because: Acute GI bleed   Final disposition: Likely home once medically cleared Consultants:  Gastroenterology  Sch Meds:  Scheduled Meds:  folic acid  1 mg Oral Daily   hydroxyurea  500 mg Oral BID   metoCLOPramide (REGLAN) injection  10 mg Intravenous Q6H   multivitamin with minerals  1 tablet Oral Daily   pantoprazole (PROTONIX) IV  40 mg Intravenous Q12H   peg 3350 powder  1  kit Oral Once   Continuous Infusions:  sodium chloride 75 mL/hr at 06/24/22 2256   PRN Meds:.acetaminophen, melatonin, prochlorperazine  Antimicrobials: Anti-infectives (From admission, onward)    None        I have personally reviewed the following labs and images: CBC: Recent Labs  Lab 06/24/22 1421 06/25/22 0350 06/25/22 1012  WBC 5.4 4.4  --   NEUTROABS 4.3 3.3  --   HGB 10.5* 8.9* 9.4*  HCT 33.1* 28.5* 30.1*  MCV 111.1* 113.1*  --   PLT 337 248  --     BMP &GFR Recent Labs  Lab 06/24/22 1421 06/25/22 0350  NA 139 139  K 4.2 3.5  CL 106 113*  CO2 28 23  GLUCOSE 115* 105*  BUN 15 15  CREATININE 0.77 0.56  CALCIUM 9.4 8.6*  MG  --  2.1  PHOS  --  3.1   Estimated Creatinine Clearance: 68.6 mL/min (by C-G formula based on SCr of 0.56 mg/dL). Liver & Pancreas: Recent Labs  Lab 06/24/22 1421 06/25/22 0350  AST 24 21  ALT 20 16  ALKPHOS 57 44  BILITOT 0.6 0.4  PROT 6.5 5.3*  ALBUMIN 4.2 3.4*   Recent Labs  Lab 06/24/22 1421  LIPASE 47   No results for input(s): "AMMONIA" in the last 168 hours. Diabetic: No results for input(s): "HGBA1C" in the last 72 hours. No results for input(s): "GLUCAP" in the last 168 hours. Cardiac Enzymes: No results for input(s): "CKTOTAL", "CKMB", "CKMBINDEX", "TROPONINI" in the last 168 hours. No results for input(s): "PROBNP" in the last 8760 hours. Coagulation Profile: Recent Labs  Lab 06/24/22 2013  INR 1.0   Thyroid Function Tests: No results for input(s): "TSH", "T4TOTAL", "FREET4", "T3FREE", "THYROIDAB" in the last 72 hours. Lipid Profile: No results for input(s): "CHOL", "HDL", "LDLCALC", "TRIG", "CHOLHDL", "LDLDIRECT" in the last 72 hours. Anemia Panel: No results for input(s): "VITAMINB12", "FOLATE", "FERRITIN", "TIBC", "IRON", "RETICCTPCT" in the last 72 hours. Urine analysis:    Component Value Date/Time   COLORURINE YELLOW 06/24/2022 2119   APPEARANCEUR CLEAR 06/24/2022 2119   LABSPEC >1.030 (H) 06/24/2022 2119   PHURINE 5.5 06/24/2022 2119   GLUCOSEU NEGATIVE 06/24/2022 2119   HGBUR NEGATIVE 06/24/2022 2119   [place] NEGATIVE 06/24/2022 2119   [place] NEGATIVE 06/24/2022 2119   PROTEINUR NEGATIVE 06/24/2022 2119   NITRITE NEGATIVE 06/24/2022 2119   LEUKOCYTESUR NEGATIVE 06/24/2022 2119   Sepsis Labs: Invalid input(s): "PROCALCITONIN", "LACTICIDVEN"  Microbiology: No results found for this or any previous visit (from the past 240 hour(s)).  Radiology  Studies: No results found.    [place] T. Champion  If 7PM-7AM, please contact night-coverage [email or web address] 06/25/2022, 11:46 AM

## 2022-06-25 NOTE — H&P (View-Only) (Signed)
Referring Provider:  Francia Greaves, MD; [facility]         Primary Care Physician:  Biagio Borg, MD Primary Gastroenterologist:  Lucio Edward, MD            Reason for Consultation: recurrent GI bleeding                  ASSESSMENT /  PLAN   71 y.o. 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    Recurrent GI bleeding/hx of intestinal angioectasias/IDA --not a new problem for her.  The difficult part of the vessels in the colon is that the telangiectasias are diffuse and none were actively bleeding nor felt amenable to APC in Aug.  At this point I think it is reasonable to repeat endoscopic evaluation given presentation and definitive rebleeding and drop in Hgb.  If enteroscopy and colonoscopy reveal no new findings or treatable lesions, I would consider video capsule endoscopy.  Thereafter if no obvious acute source, given the burden of colonic telangiectasias/angioectasias I would even entertain the possibility of sub-total colectomy (though this would be after thoughtful consideration of discuss with surgery). -- Small bowel enteroscopy tomorrow, colonoscopy tomorrow; anticipate video capsule endoscopy thereafter.  Consider tattoo at distalmost extent of small bowel enteroscopy so that if capsule shows small bowel bleeding we will know if its before or after the reach of the enteroscope -- Follow hemoglobin -- Bowel prep this evening, and tomorrow morning; anticipate procedures around 1:30 PM tomorrow with Dr. Candis Schatz   2. 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  She does follow with hematology (Dr.  Lorenso Courier). --Check ferritin -- IV iron if ferritin is low  The nature of the procedures, as well as the risks, benefits, and alternatives were carefully and thoroughly reviewed with the patient. Ample time for discussion and questions allowed. The patient understood, was satisfied, and agreed to proceed.    HPI:     Kristina Osborne is a 71 y.o. 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  She also has a history of JAK2 gene mutation and essential thrombocytosis, GERD, mitral valve prolapse.  Small bowel enteroscopy was performed on 04/17/2022: 2 small jejunal angiectasias ablated with APC, pedunculated duodenal polyp resected with hot snare and clipped -polyp was benign (hyperplastic).  Colonoscopy on 04/17/2022: Many telangiectatic vessels versus lacy angioectasias throughout the colon.  Right ileocolonic anastomosis.  Inflammatory polyp removed from the sigmoid.  None of the telangiectatic vessels were felt amenable to APC.  Intermittently over the last multiple weeks she has had maroon stool and intermittent black stool.  Occurring more significantly over the last 3 to 4 days.  No significant abdominal pain.  Not using aspirin or NSAIDs.  Not having nausea nor vomiting.  Some mild shortness of breath and palpitation with exertion.  In the ER she received IV PPI and a fluid bolus.  She was noted to be normotensive and not tachycardic.  Hemoglobin 10.5 on triage, but early this morning 8.9 after fluid bolus; hemoglobin max since late August was 11.3 on 04/05/2022 MCV is 113 Platelet count normal 248 White count 4.4 INR 1.0 Electrolytes largely normal, BUN is 15, creatinine 0.56 Normal  liver enzymes  Of note she had an evaluation at [facility] in late 2020/early 2021 including a small bowel enteroscopy.  I will paste the SBE results below obtain via care everywhere.  The patient also recalls the possibility of "abdominal vessel narrowing" based on imaging.  She was seen by cardiology in November 2020 and the question was raised about celiac and superior mesenteric artery stenosis.  The patient was told after subsequent imaging which sounds like MR angiography that there was no hemodynamically significant vascular narrowing.  Small bowel enteroscopy November 2020 in [place]: Findings:      The esophagus was normal.       The stomach was normal.       There was no evidence of significant pathology in the entire examined       duodenum.       There was evidence of a widely patent previous surgical anastomosis in       the proximal jejunum. This was characterized by erythema.       There was no evidence of significant pathology in the mid-jejunum.  Post-Op Diagnosis:     - Widely patent previous surgical anastomosis,                         characterized by erythema was found in the jejunum. It                         is possible that slow oozing from the anastomosis is                         responsible for the patient's GI blood loss. No focal                         lesion amenable to intervention identified.                         - The examined portion of the jejunum distal to the                         anastomosis was normal.                         - No specimens collected.  Recommendation:        - Patient has a contact number available for                         emergencies. The signs and symptoms of potential                         delayed complications were discussed with the patient.                         Return to normal activities tomorrow. Written                         discharge instructions were provided to  the patient.                         -  Resume previous diet.                         - Continue present medications.                         - Return to referring physician as previously                         scheduled.    Past Medical History:  Diagnosis Date   ABLA (acute blood loss anemia) 04/16/2022   Congenital malformation of intestinal fixation    Gastritis    Gastroesophageal reflux disease without esophagitis 09/10/2021   IDA (iron deficiency anemia)    Mitral valve prolapse    Thrombocytosis     Past Surgical History:  Procedure Laterality Date   COLONOSCOPY N/A 04/17/2022   Procedure: COLONOSCOPY;  Surgeon: Jerene Bears, MD;  Location: [facility];  Service: Gastroenterology;  Laterality: N/A;   ENTEROSCOPY N/A 04/17/2022   Procedure: ENTEROSCOPY;  Surgeon: Jerene Bears, MD;  Location: [facility];  Service: Gastroenterology;  Laterality: N/A;   HEMOSTASIS CLIP PLACEMENT  04/17/2022   Procedure: HEMOSTASIS CLIP PLACEMENT;  Surgeon: Jerene Bears, MD;  Location: [facility];  Service: Gastroenterology;;   HOT HEMOSTASIS N/A 04/17/2022   Procedure: HOT HEMOSTASIS (ARGON PLASMA COAGULATION/BICAP);  Surgeon: Jerene Bears, MD;  Location: [facility];  Service: Gastroenterology;  Laterality: N/A;   POLYPECTOMY  04/17/2022   Procedure: POLYPECTOMY;  Surgeon: Jerene Bears, MD;  Location: [facility];  Service: Gastroenterology;;   [place]    Prior to Admission medications   Medication Sig Start Date End Date Taking? Authorizing Provider  Ascorbic Acid (VITAMIN C) 1000 MG tablet Take 500 mg by mouth in the morning and at bedtime.    [provider]  cholecalciferol (VITAMIN D3) 25 MCG (1000 UNIT) tablet Take 1,000 Units by mouth daily.    [provider]  famotidine (PEPCID) 40 MG tablet Take 1 tablet (40 mg total) by mouth daily. 09/20/21   Ladene Artist, MD  folic acid (FOLVITE) 1 MG tablet TAKE 1 TABLET BY MOUTH EVERY DAY 06/04/22   Orson Slick, MD   glucosamine-chondroitin 500-400 MG tablet Take 1 tablet by mouth daily.    [provider]  hydroxyurea (HYDREA) 500 MG capsule TAKE 1 CAPSULE BY MOUTH TWICE A DAY 05/23/22   Orson Slick, MD  methocarbamol (ROBAXIN) 500 MG tablet Take 1 tablet (500 mg total) by mouth every 6 (six) hours as needed for muscle spasms. 01/20/22   Biagio Borg, MD  Methylsulfonylmethane (MSM PO) Take 1 capsule by mouth daily.    [provider]  metoprolol tartrate (LOPRESSOR) 25 MG tablet Take 12.5 mg by mouth 2 (two) times daily. 04/17/22   [provider]  Multiple Vitamins-Minerals (MULTIVITAMIN ADULTS 50+) TABS Take 1 tablet by mouth daily.    [provider]  sucralfate (CARAFATE) 1 g tablet Take 1 tablet (1 g total) by mouth 4 (four) times daily -  with meals and at bedtime. Patient taking differently: Take 1 g by mouth 2 (two) times daily. 09/20/21 04/20/22  Ladene Artist, MD    Current Facility-Administered Medications  Medication Dose Route Frequency Provider Last Rate Last Admin   0.9 %  sodium chloride infusion   Intravenous Continuous Kayleen Memos, DO 75 mL/hr at 06/24/22 2256 New Bag at 06/24/22 2256   acetaminophen (TYLENOL) tablet 650 mg  650 mg Oral Q6H PRN Kayleen Memos, DO   650 mg at 72/09/47 0962   folic acid (FOLVITE) tablet 1 mg  1 mg Oral Daily Irene Pap N, DO   1 mg at 06/24/22 2149   melatonin tablet 5 mg  5 mg Oral QHS PRN Kayleen Memos, DO       multivitamin with minerals tablet 1 tablet  1 tablet Oral Daily Kayleen Memos, DO   1 tablet at 06/24/22 2149   pantoprazole (PROTONIX) injection 40 mg  40 mg Intravenous BID Irene Pap N, DO       prochlorperazine (COMPAZINE) injection 5 mg  5 mg Intravenous Q6H PRN Irene Pap N, DO        Allergies as of 06/24/2022 - Review Complete 06/24/2022  Allergen Reaction Noted   Aspirin Other (See Comments) 09/01/2021   Sulfa antibiotics  04/08/2021    Family History  Problem Relation Age of  Onset   Heart disease Mother    Valvular heart disease Mother    Healthy Sister    Alcoholism Half-Sister     Social History   Tobacco Use   Smoking status: Former    Years: 15.00    Types: Cigarettes   Smokeless tobacco: Never  Vaping Use   Vaping Use: Never used  Substance Use Topics   Alcohol use: Not Currently   Drug use: Never    Review of Systems: All systems reviewed and negative except where noted in HPI.  Physical Exam: Vital signs in last 24 hours: Temp:  [97.7 F (36.5 C)-99 F (37.2 C)] 97.7 F (36.5 C) (10/29 0634) Pulse Rate:  [77-94] 80 (10/29 0634) Resp:  [15-20] 20 (10/29 0634) BP: (95-124)/(51-105) 98/57 (10/29 0634) SpO2:  [94 %-100 %] 98 % (10/29 0634) Weight:  [77.1 kg] 77.1 kg (10/28 1411) Last BM Date : 06/24/22 Gen: awake, alert, NAD HEENT: anicteric CV: RRR, no mrg Pulm: CTA b/l Abd: soft, NT/ND, +BS throughout Ext: no c/c/e Neuro: nonfocal    Intake/Output from previous day: 10/28 0701 - 10/29 0700 In: 1374.5 [I.V.:375.5; IV Piggyback:999] Out: -  Intake/Output this shift: No intake/output data recorded.  Lab Results: Recent Labs    06/24/22 1421 06/25/22 0350  WBC 5.4 4.4  HGB 10.5* 8.9*  HCT 33.1* 28.5*  PLT 337 248   BMET Recent Labs    06/24/22 1421 06/25/22 0350  NA 139 139  K 4.2 3.5  CL 106 113*  CO2 28 23  GLUCOSE 115* 105*  BUN 15 15  CREATININE 0.77 0.56  CALCIUM 9.4 8.6*   LFT Recent Labs    06/25/22 0350  PROT 5.3*  ALBUMIN 3.4*  AST 21  ALT 16  ALKPHOS 44  BILITOT 0.4   PT/INR Recent Labs    06/24/22 2013  LABPROT 13.3  INR 1.0   Hepatitis Panel No results for input(s): "HEPBSAG", "HCVAB", "HEPAIGM", "HEPBIGM" in the last 72 hours.   .    Latest Ref Rng & Units 06/25/2022    3:50 AM 06/24/2022    2:21 PM 06/16/2022   11:23 AM  CBC  WBC 4.0 - 10.5 K/uL 4.4  5.4  6.1   Hemoglobin 12.0 - 15.0 g/dL 8.9  10.5  11.0  Hematocrit 36.0 - 46.0 % 28.5  33.1  34.5   Platelets 150 -  400 K/uL 248  337  383     .    Latest Ref Rng & Units 06/25/2022    3:50 AM 06/24/2022    2:21 PM 05/05/2022   11:49 AM  CMP  Glucose 70 - 99 mg/dL 105  115  98   BUN 8 - 23 mg/dL '15  15  11   '$ Creatinine 0.44 - 1.00 mg/dL 0.56  0.77  0.87   Sodium 135 - 145 mmol/L 139  139  137   Potassium 3.5 - 5.1 mmol/L 3.5  4.2  4.9   Chloride 98 - 111 mmol/L 113  106  104   CO2 22 - 32 mmol/L '23  28  29   '$ Calcium 8.9 - 10.3 mg/dL 8.6  9.4  10.1   Total Protein 6.5 - 8.1 g/dL 5.3  6.5  6.9   Total Bilirubin 0.3 - 1.2 mg/dL 0.4  0.6  0.3   Alkaline Phos 38 - 126 U/L 44  57  57   AST 15 - 41 U/L '21  24  23   '$ ALT 0 - 44 U/L '16  20  16     '$ Principal Problem:   GI bleed    Diane Hanel M. Laurelai Lepp, M.D. @  06/25/2022, 8:05 AM

## 2022-06-25 NOTE — Consult Note (Signed)
Referring Provider:  Francia Greaves, MD; [facility]         Primary Care Physician:  Biagio Borg, MD Primary Gastroenterologist:  Lucio Edward, MD            Reason for Consultation: recurrent GI bleeding                  ASSESSMENT /  PLAN   71 y.o. 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    Recurrent GI bleeding/hx of intestinal angioectasias/IDA --not a new problem for her.  The difficult part of the vessels in the colon is that the telangiectasias are diffuse and none were actively bleeding nor felt amenable to APC in Aug.  At this point I think it is reasonable to repeat endoscopic evaluation given presentation and definitive rebleeding and drop in Hgb.  If enteroscopy and colonoscopy reveal no new findings or treatable lesions, I would consider video capsule endoscopy.  Thereafter if no obvious acute source, given the burden of colonic telangiectasias/angioectasias I would even entertain the possibility of sub-total colectomy (though this would be after thoughtful consideration of discuss with surgery). -- Small bowel enteroscopy tomorrow, colonoscopy tomorrow; anticipate video capsule endoscopy thereafter.  Consider tattoo at distalmost extent of small bowel enteroscopy so that if capsule shows small bowel bleeding we will know if its before or after the reach of the enteroscope -- Follow hemoglobin -- Bowel prep this evening, and tomorrow morning; anticipate procedures around 1:30 PM tomorrow with Dr. Candis Schatz   2. 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  She does follow with hematology (Dr.  Lorenso Courier). --Check ferritin -- IV iron if ferritin is low  The nature of the procedures, as well as the risks, benefits, and alternatives were carefully and thoroughly reviewed with the patient. Ample time for discussion and questions allowed. The patient understood, was satisfied, and agreed to proceed.    HPI:     Kristina Osborne is a 71 y.o. 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  She also has a history of JAK2 gene mutation and essential thrombocytosis, GERD, mitral valve prolapse.  Small bowel enteroscopy was performed on 04/17/2022: 2 small jejunal angiectasias ablated with APC, pedunculated duodenal polyp resected with hot snare and clipped -polyp was benign (hyperplastic).  Colonoscopy on 04/17/2022: Many telangiectatic vessels versus lacy angioectasias throughout the colon.  Right ileocolonic anastomosis.  Inflammatory polyp removed from the sigmoid.  None of the telangiectatic vessels were felt amenable to APC.  Intermittently over the last multiple weeks she has had maroon stool and intermittent black stool.  Occurring more significantly over the last 3 to 4 days.  No significant abdominal pain.  Not using aspirin or NSAIDs.  Not having nausea nor vomiting.  Some mild shortness of breath and palpitation with exertion.  In the ER she received IV PPI and a fluid bolus.  She was noted to be normotensive and not tachycardic.  Hemoglobin 10.5 on triage, but early this morning 8.9 after fluid bolus; hemoglobin max since late August was 11.3 on 04/05/2022 MCV is 113 Platelet count normal 248 White count 4.4 INR 1.0 Electrolytes largely normal, BUN is 15, creatinine 0.56 Normal  liver enzymes  Of note she had an evaluation at [facility] in late 2020/early 2021 including a small bowel enteroscopy.  I will paste the SBE results below obtain via care everywhere.  The patient also recalls the possibility of "abdominal vessel narrowing" based on imaging.  She was seen by cardiology in November 2020 and the question was raised about celiac and superior mesenteric artery stenosis.  The patient was told after subsequent imaging which sounds like MR angiography that there was no hemodynamically significant vascular narrowing.  Small bowel enteroscopy November 2020 in [place]: Findings:      The esophagus was normal.       The stomach was normal.       There was no evidence of significant pathology in the entire examined       duodenum.       There was evidence of a widely patent previous surgical anastomosis in       the proximal jejunum. This was characterized by erythema.       There was no evidence of significant pathology in the mid-jejunum.  Post-Op Diagnosis:     - Widely patent previous surgical anastomosis,                         characterized by erythema was found in the jejunum. It                         is possible that slow oozing from the anastomosis is                         responsible for the patient's GI blood loss. No focal                         lesion amenable to intervention identified.                         - The examined portion of the jejunum distal to the                         anastomosis was normal.                         - No specimens collected.  Recommendation:        - Patient has a contact number available for                         emergencies. The signs and symptoms of potential                         delayed complications were discussed with the patient.                         Return to normal activities tomorrow. Written                         discharge instructions were provided to  the patient.                         -  Resume previous diet.                         - Continue present medications.                         - Return to referring physician as previously                         scheduled.    Past Medical History:  Diagnosis Date   ABLA (acute blood loss anemia) 04/16/2022   Congenital malformation of intestinal fixation    Gastritis    Gastroesophageal reflux disease without esophagitis 09/10/2021   IDA (iron deficiency anemia)    Mitral valve prolapse    Thrombocytosis     Past Surgical History:  Procedure Laterality Date   COLONOSCOPY N/A 04/17/2022   Procedure: COLONOSCOPY;  Surgeon: Jerene Bears, MD;  Location: [facility];  Service: Gastroenterology;  Laterality: N/A;   ENTEROSCOPY N/A 04/17/2022   Procedure: ENTEROSCOPY;  Surgeon: Jerene Bears, MD;  Location: [facility];  Service: Gastroenterology;  Laterality: N/A;   HEMOSTASIS CLIP PLACEMENT  04/17/2022   Procedure: HEMOSTASIS CLIP PLACEMENT;  Surgeon: Jerene Bears, MD;  Location: [facility];  Service: Gastroenterology;;   HOT HEMOSTASIS N/A 04/17/2022   Procedure: HOT HEMOSTASIS (ARGON PLASMA COAGULATION/BICAP);  Surgeon: Jerene Bears, MD;  Location: [facility];  Service: Gastroenterology;  Laterality: N/A;   POLYPECTOMY  04/17/2022   Procedure: POLYPECTOMY;  Surgeon: Jerene Bears, MD;  Location: [facility];  Service: Gastroenterology;;   [place]    Prior to Admission medications   Medication Sig Start Date End Date Taking? Authorizing Provider  Ascorbic Acid (VITAMIN C) 1000 MG tablet Take 500 mg by mouth in the morning and at bedtime.    [provider]  cholecalciferol (VITAMIN D3) 25 MCG (1000 UNIT) tablet Take 1,000 Units by mouth daily.    [provider]  famotidine (PEPCID) 40 MG tablet Take 1 tablet (40 mg total) by mouth daily. 09/20/21   Ladene Artist, MD  folic acid (FOLVITE) 1 MG tablet TAKE 1 TABLET BY MOUTH EVERY DAY 06/04/22   Orson Slick, MD   glucosamine-chondroitin 500-400 MG tablet Take 1 tablet by mouth daily.    [provider]  hydroxyurea (HYDREA) 500 MG capsule TAKE 1 CAPSULE BY MOUTH TWICE A DAY 05/23/22   Orson Slick, MD  methocarbamol (ROBAXIN) 500 MG tablet Take 1 tablet (500 mg total) by mouth every 6 (six) hours as needed for muscle spasms. 01/20/22   Biagio Borg, MD  Methylsulfonylmethane (MSM PO) Take 1 capsule by mouth daily.    [provider]  metoprolol tartrate (LOPRESSOR) 25 MG tablet Take 12.5 mg by mouth 2 (two) times daily. 04/17/22   [provider]  Multiple Vitamins-Minerals (MULTIVITAMIN ADULTS 50+) TABS Take 1 tablet by mouth daily.    [provider]  sucralfate (CARAFATE) 1 g tablet Take 1 tablet (1 g total) by mouth 4 (four) times daily -  with meals and at bedtime. Patient taking differently: Take 1 g by mouth 2 (two) times daily. 09/20/21 04/20/22  Ladene Artist, MD    Current Facility-Administered Medications  Medication Dose Route Frequency Provider Last Rate Last Admin   0.9 %  sodium chloride infusion   Intravenous Continuous Kayleen Memos, DO 75 mL/hr at 06/24/22 2256 New Bag at 06/24/22 2256   acetaminophen (TYLENOL) tablet 650 mg  650 mg Oral Q6H PRN Kayleen Memos, DO   650 mg at 01/60/10 9323   folic acid (FOLVITE) tablet 1 mg  1 mg Oral Daily Irene Pap N, DO   1 mg at 06/24/22 2149   melatonin tablet 5 mg  5 mg Oral QHS PRN Kayleen Memos, DO       multivitamin with minerals tablet 1 tablet  1 tablet Oral Daily Kayleen Memos, DO   1 tablet at 06/24/22 2149   pantoprazole (PROTONIX) injection 40 mg  40 mg Intravenous BID Irene Pap N, DO       prochlorperazine (COMPAZINE) injection 5 mg  5 mg Intravenous Q6H PRN Irene Pap N, DO        Allergies as of 06/24/2022 - Review Complete 06/24/2022  Allergen Reaction Noted   Aspirin Other (See Comments) 09/01/2021   Sulfa antibiotics  04/08/2021    Family History  Problem Relation Age of  Onset   Heart disease Mother    Valvular heart disease Mother    Healthy Sister    Alcoholism Half-Sister     Social History   Tobacco Use   Smoking status: Former    Years: 15.00    Types: Cigarettes   Smokeless tobacco: Never  Vaping Use   Vaping Use: Never used  Substance Use Topics   Alcohol use: Not Currently   Drug use: Never    Review of Systems: All systems reviewed and negative except where noted in HPI.  Physical Exam: Vital signs in last 24 hours: Temp:  [97.7 F (36.5 C)-99 F (37.2 C)] 97.7 F (36.5 C) (10/29 0634) Pulse Rate:  [77-94] 80 (10/29 0634) Resp:  [15-20] 20 (10/29 0634) BP: (95-124)/(51-105) 98/57 (10/29 0634) SpO2:  [94 %-100 %] 98 % (10/29 0634) Weight:  [77.1 kg] 77.1 kg (10/28 1411) Last BM Date : 06/24/22 Gen: awake, alert, NAD HEENT: anicteric CV: RRR, no mrg Pulm: CTA b/l Abd: soft, NT/ND, +BS throughout Ext: no c/c/e Neuro: nonfocal    Intake/Output from previous day: 10/28 0701 - 10/29 0700 In: 1374.5 [I.V.:375.5; IV Piggyback:999] Out: -  Intake/Output this shift: No intake/output data recorded.  Lab Results: Recent Labs    06/24/22 1421 06/25/22 0350  WBC 5.4 4.4  HGB 10.5* 8.9*  HCT 33.1* 28.5*  PLT 337 248   BMET Recent Labs    06/24/22 1421 06/25/22 0350  NA 139 139  K 4.2 3.5  CL 106 113*  CO2 28 23  GLUCOSE 115* 105*  BUN 15 15  CREATININE 0.77 0.56  CALCIUM 9.4 8.6*   LFT Recent Labs    06/25/22 0350  PROT 5.3*  ALBUMIN 3.4*  AST 21  ALT 16  ALKPHOS 44  BILITOT 0.4   PT/INR Recent Labs    06/24/22 2013  LABPROT 13.3  INR 1.0   Hepatitis Panel No results for input(s): "HEPBSAG", "HCVAB", "HEPAIGM", "HEPBIGM" in the last 72 hours.   .    Latest Ref Rng & Units 06/25/2022    3:50 AM 06/24/2022    2:21 PM 06/16/2022   11:23 AM  CBC  WBC 4.0 - 10.5 K/uL 4.4  5.4  6.1   Hemoglobin 12.0 - 15.0 g/dL 8.9  10.5  11.0  Hematocrit 36.0 - 46.0 % 28.5  33.1  34.5   Platelets 150 -  400 K/uL 248  337  383     .    Latest Ref Rng & Units 06/25/2022    3:50 AM 06/24/2022    2:21 PM 05/05/2022   11:49 AM  CMP  Glucose 70 - 99 mg/dL 105  115  98   BUN 8 - 23 mg/dL '15  15  11   '$ Creatinine 0.44 - 1.00 mg/dL 0.56  0.77  0.87   Sodium 135 - 145 mmol/L 139  139  137   Potassium 3.5 - 5.1 mmol/L 3.5  4.2  4.9   Chloride 98 - 111 mmol/L 113  106  104   CO2 22 - 32 mmol/L '23  28  29   '$ Calcium 8.9 - 10.3 mg/dL 8.6  9.4  10.1   Total Protein 6.5 - 8.1 g/dL 5.3  6.5  6.9   Total Bilirubin 0.3 - 1.2 mg/dL 0.4  0.6  0.3   Alkaline Phos 38 - 126 U/L 44  57  57   AST 15 - 41 U/L '21  24  23   '$ ALT 0 - 44 U/L '16  20  16     '$ Principal Problem:   GI bleed    Lakyia Behe M. Myley Bahner, M.D. @  06/25/2022, 8:05 AM

## 2022-06-26 ENCOUNTER — Encounter (HOSPITAL_COMMUNITY): Payer: Self-pay | Admitting: Internal Medicine

## 2022-06-26 ENCOUNTER — Inpatient Hospital Stay (HOSPITAL_COMMUNITY): Payer: No Typology Code available for payment source | Admitting: Anesthesiology

## 2022-06-26 ENCOUNTER — Encounter (HOSPITAL_COMMUNITY)
Admission: EM | Disposition: A | Discharge status: Home / Self Care | Payer: Self-pay | Source: Home / Self Care | Attending: Student

## 2022-06-26 DIAGNOSIS — K2100 Gastro-esophageal reflux disease with esophagitis, without bleeding: Secondary | ICD-10-CM | POA: Diagnosis not present

## 2022-06-26 DIAGNOSIS — K635 Polyp of colon: Secondary | ICD-10-CM

## 2022-06-26 DIAGNOSIS — D500 Iron deficiency anemia secondary to blood loss (chronic): Secondary | ICD-10-CM

## 2022-06-26 DIAGNOSIS — D539 Nutritional anemia, unspecified: Secondary | ICD-10-CM | POA: Diagnosis not present

## 2022-06-26 DIAGNOSIS — K5520 Angiodysplasia of colon without hemorrhage: Secondary | ICD-10-CM | POA: Diagnosis not present

## 2022-06-26 DIAGNOSIS — K31819 Angiodysplasia of stomach and duodenum without bleeding: Secondary | ICD-10-CM

## 2022-06-26 DIAGNOSIS — Z87891 Personal history of nicotine dependence: Secondary | ICD-10-CM

## 2022-06-26 DIAGNOSIS — K921 Melena: Secondary | ICD-10-CM | POA: Diagnosis not present

## 2022-06-26 DIAGNOSIS — I10 Essential (primary) hypertension: Secondary | ICD-10-CM

## 2022-06-26 DIAGNOSIS — D62 Acute posthemorrhagic anemia: Secondary | ICD-10-CM | POA: Diagnosis not present

## 2022-06-26 HISTORY — PX: HEMOSTASIS CLIP PLACEMENT: SHX6857

## 2022-06-26 HISTORY — PX: SUBMUCOSAL TATTOO INJECTION: SHX6856

## 2022-06-26 HISTORY — PX: BIOPSY: SHX5522

## 2022-06-26 HISTORY — PX: POLYPECTOMY: SHX5525

## 2022-06-26 HISTORY — PX: COLONOSCOPY WITH PROPOFOL: SHX5780

## 2022-06-26 HISTORY — PX: HOT HEMOSTASIS: SHX5433

## 2022-06-26 HISTORY — PX: ENTEROSCOPY: SHX5533

## 2022-06-26 HISTORY — PX: GIVENS CAPSULE STUDY: SHX5432

## 2022-06-26 LAB — CBC
HCT: 27.4 % — ABNORMAL LOW (ref 36.0–46.0)
Hemoglobin: 8.7 g/dL — ABNORMAL LOW (ref 12.0–15.0)
MCH: 34.9 pg — ABNORMAL HIGH (ref 26.0–34.0)
MCHC: 31.8 g/dL (ref 30.0–36.0)
MCV: 110 fL — ABNORMAL HIGH (ref 80.0–100.0)
Platelets: 242 10*3/uL (ref 150–400)
RBC: 2.49 MIL/uL — ABNORMAL LOW (ref 3.87–5.11)
RDW: 15.9 % — ABNORMAL HIGH (ref 11.5–15.5)
WBC: 3.2 10*3/uL — ABNORMAL LOW (ref 4.0–10.5)
nRBC: 0 % (ref 0.0–0.2)

## 2022-06-26 LAB — MAGNESIUM: Magnesium: 1.7 mg/dL (ref 1.7–2.4)

## 2022-06-26 LAB — TSH: TSH: 3.341 u[IU]/mL (ref 0.350–4.500)

## 2022-06-26 SURGERY — ENTEROSCOPY
Anesthesia: Monitor Anesthesia Care

## 2022-06-26 MED ORDER — LIDOCAINE 2% (20 MG/ML) 5 ML SYRINGE
INTRAMUSCULAR | Status: DC | PRN
  Administered 2022-06-26: 100 mg via INTRAVENOUS

## 2022-06-26 MED ORDER — SPOT INK MARKER SYRINGE KIT
PACK | SUBMUCOSAL | Status: DC | PRN
  Administered 2022-06-26: 3.5 mL via SUBMUCOSAL

## 2022-06-26 MED ORDER — LACTATED RINGERS IV SOLN: INTRAVENOUS | Status: DC | PRN

## 2022-06-26 MED ORDER — PROPOFOL 500 MG/50ML IV EMUL
INTRAVENOUS | Status: DC | PRN
  Administered 2022-06-26: 125 ug/kg/min via INTRAVENOUS

## 2022-06-26 MED ORDER — SIMETHICONE 80 MG PO CHEW
80.0000 mg | CHEWABLE_TABLET | Freq: Once | ORAL | Status: AC
  Administered 2022-06-26: 80 mg via ORAL
  Filled 2022-06-26: qty 1

## 2022-06-26 MED ORDER — PROPOFOL 10 MG/ML IV BOLUS
INTRAVENOUS | Status: DC | PRN
  Administered 2022-06-26 (×2): 20 mg via INTRAVENOUS

## 2022-06-26 MED ORDER — PROPOFOL 1000 MG/100ML IV EMUL
INTRAVENOUS | Status: AC
  Filled 2022-06-26: qty 100

## 2022-06-26 MED ORDER — SPOT INK MARKER SYRINGE KIT
PACK | SUBMUCOSAL | Status: AC
  Filled 2022-06-26: qty 5

## 2022-06-26 SURGICAL SUPPLY — 22 items

## 2022-06-26 NOTE — Anesthesia Procedure Notes (Signed)
Date/Time: 06/26/2022 1:16 PM  Performed by: Sharlette Dense, CRNAOxygen Delivery Method: Simple face mask

## 2022-06-26 NOTE — Anesthesia Preprocedure Evaluation (Addendum)
Anesthesia Evaluation  Patient identified by MRN, date of birth, ID band Patient awake    Reviewed: Allergy & Precautions, NPO status , Patient's Chart, lab work & pertinent test results, reviewed documented beta blocker date and time   History of Anesthesia Complications Negative for: history of anesthetic complications  Airway Mallampati: II  TM Distance: >3 FB Neck ROM: Full    Dental  (+) Dental Advisory Given   Pulmonary former smoker,    breath sounds clear to auscultation       Cardiovascular hypertension (no longer takes BP meds), (-) angina Rhythm:Regular Rate:Normal     Neuro/Psych    GI/Hepatic Neg liver ROS, GERD  Medicated and Controlled,AVM small bowel   Endo/Other  negative endocrine ROS  Renal/GU negative Renal ROS     Musculoskeletal   Abdominal   Peds  Hematology  (+) Blood dyscrasia (Hb 8.7), anemia ,   Anesthesia Other Findings   Reproductive/Obstetrics                            Anesthesia Physical Anesthesia Plan  ASA: 3  Anesthesia Plan: MAC   Post-op Pain Management: Minimal or no pain anticipated   Induction:   PONV Risk Score and Plan: 2 and Ondansetron, Treatment may vary due to age or medical condition, Propofol infusion and TIVA  Airway Management Planned: Natural Airway and Nasal Cannula  Additional Equipment: None  Intra-op Plan:   Post-operative Plan:   Informed Consent: I have reviewed the patients History and Physical, chart, labs and discussed the procedure including the risks, benefits and alternatives for the proposed anesthesia with the patient or authorized representative who has indicated his/her understanding and acceptance.     Dental advisory given  Plan Discussed with: CRNA and Surgeon  Anesthesia Plan Comments:        Anesthesia Quick Evaluation

## 2022-06-26 NOTE — Op Note (Signed)
[facility] Patient Name: Kristina Osborne Procedure Date: 06/26/2022 MRN: [id number] Attending MD: Gladstone Pih. Candis Schatz , MD, [id number] Date of Birth: 04/09/1951 CSN: [id number] Age: 71 Admit Type: Inpatient Procedure:                Colonoscopy Indications:              Hematochezia, Melena; patient has history of                            significant bleeding from GI AVMs, eventually                            treated with R hemicolectomy in 1997 and then a                            jejunal resection. Did well until the past 3 years                            where she has had multiple sporadic episodes of                            melena and maroon colored stools, which start and                            stop spontaneously, recently admitted with several                            days of intermittent bleeding, weakness, and 3                            point drop in hgb. Providers:                Gladstone Pih. Candis Schatz, MD, Benay Pillow, RN, [facility] Technician, Technician, Brien Mates, RNFA Referring MD:              Medicines:                Monitored Anesthesia Care Complications:            No immediate complications. Estimated Blood Loss:     Estimated blood loss was minimal. Procedure:                Pre-Anesthesia Assessment:                           - Prior to the procedure, a History and Physical                            was performed, and patient medications and                            allergies were reviewed. The patient's tolerance of  previous anesthesia was also reviewed. The risks                            and benefits of the procedure and the sedation                            options and risks were discussed with the patient.                            All questions were answered, and informed consent                            was obtained. Prior Anticoagulants: The patient  has                            taken no anticoagulant or antiplatelet agents. ASA                            Grade Assessment: II - A patient with mild systemic                            disease. After reviewing the risks and benefits,                            the patient was deemed in satisfactory condition to                            undergo the procedure.                           After obtaining informed consent, the colonoscope                            was passed under direct vision. Throughout the                            procedure, the patient's blood pressure, pulse, and                            oxygen saturations were monitored continuously. The                            [id number]-HQ190L ([id number]) Olympus colonoscope was                            introduced through the anus and advanced to the the                            ileocolonic anastomosis. The colonoscopy was                            performed without difficulty. The patient tolerated  the procedure well. The quality of the bowel                            preparation was good. The terminal ileum, ileocecal                            valve, appendiceal orifice, and rectum were                            photographed. Scope In: 1:54:12 PM Scope Out: 2:21:06 PM Scope Withdrawal Time: 0 hours 22 minutes 52 seconds  Total Procedure Duration: 0 hours 26 minutes 54 seconds  Findings:      The perianal and digital rectal examinations were normal. Pertinent       negatives include normal sphincter tone and no palpable rectal lesions.      Multiple large angioectasias without bleeding were found in the sigmoid       colon, in the descending colon, in the transverse colon and at the       anastomosis. The angioectactic vessels were most confluent in the area       of the anastomosis      A 5 mm polyp was found in the descending colon. The polyp was sessile.       The polyp was removed with a  cold snare. Resection and retrieval were       complete. Estimated blood loss was minimal.      A 4 mm polyp was found in the recto-sigmoid colon. The polyp was       sessile. The polyp was removed with a cold snare. Resection and       retrieval were complete. The polypectomy site continued to ooze blood a       few minutes after the polyp was removed. To prevent further bleeding       after the polypectomy, one hemostatic clip was successfully placed (MR       conditional). Clip manufacturer: [employer]. There was no       bleeding at the end of the procedure.      The mucosa vascular pattern in the descending colon and in the       transverse colon was diffusely decreased. Biopsies were taken with a       cold forceps for histology. Estimated blood loss was minimal.      The exam was otherwise normal throughout the examined colon.      The neo-terminal ileum appeared normal.      The retroflexed view of the distal rectum and anal verge was normal and       showed no anal or rectal abnormalities. Impression:               - Multiple non-bleeding colonic angioectasias.                            These lesions were multiple and large, involving a                            large surface of the right colon near the  ileocolonic anastomosis. There was not active                            bleeding and it is not known which, if any of these                            vascular lesions was the culprit for the bleed.                            Definitive treatment with APC would involve                            extensive ablation of the colonic mucosa, though to                            be high risk for complications such as perforation.                           - One 5 mm polyp in the descending colon, removed                            with a cold snare. Resected and retrieved.                           - One 4 mm polyp at the recto-sigmoid colon,                             removed with a cold snare. Resected and retrieved.                            Clip (MR conditional) was placed. Clip                            manufacturer: [employer].                           - Decreased mucosa vascular pattern in the                            descending colon and in the transverse colon.                            Biopsied.                           - The examined portion of the ileum was normal.                           - The distal rectum and anal verge are normal on                            retroflexion view. Moderate Sedation:      Not Applicable - Patient had care per Anesthesia. Recommendation:           -  Return patient to hospital ward for ongoing care.                           - To visualize the small bowel and exclude other                            lesions in the small intestine, perform video                            capsule endoscopy. Will hopefully be able to do                            this today.                           - If no additional lesions are found on capsule                            endoscopy, I would suspect these colonic lesions as                            the source of bleeding and would recommend                            consultation with surgery vs. consider angiography                            with any subsequent active bleeding. Procedure Code(s):        --- Professional ---                           [phone], Colonoscopy, flexible; with removal of                            tumor(s), polyp(s), or other lesion(s) by snare                            technique                           [id number], 48, Colonoscopy, flexible; with biopsy,                            single or multiple Diagnosis Code(s):        --- Professional ---                           K55.20, Angiodysplasia of colon without hemorrhage                           D12.4, Benign neoplasm of descending colon                            D12.7, Benign neoplasm of rectosigmoid junction  K92.1, Melena (includes Hematochezia) CPT copyright 2022 American Medical Association. All rights reserved. The codes documented in this report are preliminary and upon coder review may  be revised to meet current compliance requirements. Kimblery Diop E. Candis Schatz, MD 06/26/2022 2:52:03 PM This report has been signed electronically. Number of Addenda: 0

## 2022-06-26 NOTE — Progress Notes (Signed)
[facility] C[facility] endoscopy ordered by MD Candis Schatz. Pt ingested capsule at 1654.  Per [facility] instructions, pt must remain NPO for 2 hours (until 1854). At 1854, pt may progress to clear liquids (nothing red). At 2054, pt may have a small snack such as a cup of soup, a bowl of cereal, half a sandwich, toast, eggs, rice, etc. At 0054 on 10/31, pt may return to previously ordered diet.  The capsule study will conclude at 0454 on 10/31. At that time, the recorder and leads may be removed. Endo staff will pick up the leads and recorder in the AM.  If there are any concerns, please reach out to endo at [phone]. If you reach the voicemail, please call the hospital operator and request the endo RN on call.  Debarah Crape, RN 06/26/22 5:00 PM

## 2022-06-26 NOTE — Interval H&P Note (Signed)
History and Physical Interval Note:  06/26/2022 1:02 PM  Kristina Osborne  has presented today for surgery, with the diagnosis of GI bleeding with maroon stool and melena, intestinal angioectasias.  The various methods of treatment have been discussed with the patient and family. After consideration of risks, benefits and other options for treatment, the patient has consented to  Procedure(s): ENTEROSCOPY (N/A) COLONOSCOPY WITH PROPOFOL (N/A) as a surgical intervention.  The patient's history has been reviewed, patient examined, no change in status, stable for surgery.  I have reviewed the patient's chart and labs.  Questions were answered to the patient's satisfaction.     Daryel November

## 2022-06-26 NOTE — Transfer of Care (Signed)
Immediate Anesthesia Transfer of Care Note  Patient: Kristina Osborne  Procedure(s) Performed: ENTEROSCOPY COLONOSCOPY WITH PROPOFOL SUBMUCOSAL TATTOO INJECTION HOT HEMOSTASIS (ARGON PLASMA COAGULATION/BICAP) BIOPSY POLYPECTOMY HEMOSTASIS CLIP PLACEMENT  Patient Location: Endoscopy Unit  Anesthesia Type:MAC  Level of Consciousness: drowsy  Airway & Oxygen Therapy: Patient Spontanous Breathing and Patient connected to face mask oxygen  Post-op Assessment: Report given to RN and Post -op Vital signs reviewed and stable  Post vital signs: Reviewed and stable  Last Vitals:  Vitals Value Taken Time  BP 96/57 06/26/22 1427  Temp    Pulse 74 06/26/22 1428  Resp 18 06/26/22 1428  SpO2 100 % 06/26/22 1428  Vitals shown include unvalidated device data.  Last Pain:  Vitals:   06/26/22 1225  TempSrc: Oral  PainSc: 0-No pain      Patients Stated Pain Goal: 2 (93/26/71 2458)  Complications: No notable events documented.

## 2022-06-26 NOTE — Anesthesia Postprocedure Evaluation (Signed)
Anesthesia Post Note  Patient: Skarlett Sedlacek  Procedure(s) Performed: ENTEROSCOPY COLONOSCOPY WITH PROPOFOL SUBMUCOSAL TATTOO INJECTION HOT HEMOSTASIS (ARGON PLASMA COAGULATION/BICAP) BIOPSY POLYPECTOMY HEMOSTASIS CLIP PLACEMENT     Patient location during evaluation: Endoscopy Anesthesia Type: MAC Level of consciousness: awake and alert, patient cooperative and oriented Pain management: pain level controlled Vital Signs Assessment: post-procedure vital signs reviewed and stable Respiratory status: nonlabored ventilation, spontaneous breathing and respiratory function stable Cardiovascular status: blood pressure returned to baseline and stable Postop Assessment: able to ambulate and no apparent nausea or vomiting Anesthetic complications: no   No notable events documented.  Last Vitals:  Vitals:   06/26/22 1430 06/26/22 1440  BP: (!) 103/51 119/65  Pulse: 69 69  Resp: 20 (!) 22  Temp:    SpO2: 100% 97%    Last Pain:  Vitals:   06/26/22 1440  TempSrc:   PainSc: 0-No pain                 Ryun Velez,E. Jaden Batchelder

## 2022-06-26 NOTE — Progress Notes (Signed)
PROGRESS NOTE  Kristina Osborne VQQ:[id number] DOB: [birth year]   PCP: Biagio Borg, MD  Patient is from: Home.  Independent at baseline.  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  Hgb 10.5. Hemoccult positive.  GI consulted.  Plan for small bowel enteroscopy and colonoscopy on 10/30, then possible video capsule endoscopy  Subjective: Seen and examined earlier this morning.  No major events overnight of this morning.  No further GI bleed.  Objective: Vitals:   06/25/22 1329 06/25/22 2107 06/26/22 0443 06/26/22 1225  BP: (!) 95/49 (!) 93/58 (!) 95/51 (!) 106/55  Pulse: 64 70 71 65  Resp: '16 20 20 15  '$ Temp: 97.9 F (36.6 C) 98.1 F (36.7 C) 97.9 F (36.6 C) 98.2 F (36.8 C)  TempSrc:  Oral Oral Oral  SpO2: 100% 100% 97% 96%  Weight:    77.1 kg  Height:    '5\' 6"'$  (1.676 m)    Examination:  GENERAL: No apparent distress.  Nontoxic. HEENT: MMM.  Vision and hearing grossly intact.  NECK: Supple.  No apparent JVD.  RESP:  No IWOB.  Fair aeration bilaterally. CVS:  RRR. Heart sounds normal.  ABD/GI/GU: BS+. Abd soft, NTND.  MSK/EXT:  Moves extremities. No apparent deformity. No edema.  SKIN: no apparent skin lesion or wound NEURO: Awake and alert. Oriented appropriately.  No apparent focal neuro deficit. PSYCH: Calm. Normal affect.   Procedures:  None  Microbiology summarized: None  Assessment and plan: Principal Problem:   GI bleed Active Problems:   Thrombocytosis   AVM (arteriovenous malformation) of small bowel, acquired   ABLA (acute blood loss anemia)   Symptomatic anemia   Macrocytic anemia  Symptomatic ABLA due to recurrent GI  bleed with history of AVMs: Bleeding seems to have subsided.  Some drop in Hgb could be dilutional.  Anemia panel with iron deficiency. Recent Labs    04/26/22 1131 05/05/22 1149 05/19/22 1137 06/05/22 1043 06/16/22 1123 06/24/22 1421 06/25/22 0350 06/25/22 1012 06/25/22 1603 06/26/22 0338  HGB 10.6* 11.3* 10.9* 11.8* 11.0* 10.5* 8.9* 9.4* 9.0* 8.7*  -Monitor H&H -Continue IV Protonix -Small bowel enteroscopy and colonoscopy, and possible video capsule endoscopy -SCD for VTE prophylaxis -IV iron before discharge  Macrocytic anemia: Folate > 40.  Vitamin B12 level 258.  Essential thrombocythemia on hydroxyurea -Hold home hydroxyurea   Essential hypertension: Normotensive.  Not on meds.  Body mass index is 27.43 kg/m.           DVT prophylaxis:  SCDs Start: 06/24/22 2126  Code Status: Full code Family Communication: Updated patient's husband at bedside Level of care: Telemetry Status is: Inpatient Remains inpatient appropriate because: Acute GI bleed   Final disposition: Likely home once medically cleared Consultants:  Gastroenterology  Sch Meds:  Scheduled Meds:  [MAR Hold] folic acid  1 mg Oral Daily   [MAR Hold] multivitamin with minerals  1 tablet Oral Daily   [MAR Hold] pantoprazole (PROTONIX) IV  40 mg Intravenous Q12H   Continuous Infusions:  sodium chloride 75 mL/hr at 06/26/22 1103   PRN Meds:.[MAR Hold] acetaminophen, [MAR Hold] melatonin, [MAR Hold] prochlorperazine  Antimicrobials: Anti-infectives (From admission, onward)  None        I have personally reviewed the following labs and images: CBC: Recent Labs  Lab 06/24/22 1421 06/25/22 0350 06/25/22 1012 06/25/22 1603 06/26/22 0338  WBC 5.4 4.4  --   --  3.2*  NEUTROABS 4.3 3.3  --   --   --   HGB 10.5* 8.9* 9.4* 9.0* 8.7*  HCT 33.1* 28.5* 30.1* 28.8* 27.4*  MCV 111.1* 113.1*  --   --  110.0*  PLT 337 248  --   --  242   BMP &GFR Recent Labs  Lab 06/24/22 1421  06/25/22 0350 06/26/22 0338  NA 139 139  --   K 4.2 3.5  --   CL 106 113*  --   CO2 28 23  --   GLUCOSE 115* 105*  --   BUN 15 15  --   CREATININE 0.77 0.56  --   CALCIUM 9.4 8.6*  --   MG  --  2.1 1.7  PHOS  --  3.1  --    Estimated Creatinine Clearance: 68.6 mL/min (by C-G formula based on SCr of 0.56 mg/dL). Liver & Pancreas: Recent Labs  Lab 06/24/22 1421 06/25/22 0350  AST 24 21  ALT 20 16  ALKPHOS 57 44  BILITOT 0.6 0.4  PROT 6.5 5.3*  ALBUMIN 4.2 3.4*   Recent Labs  Lab 06/24/22 1421  LIPASE 47   No results for input(s): "AMMONIA" in the last 168 hours. Diabetic: No results for input(s): "HGBA1C" in the last 72 hours. No results for input(s): "GLUCAP" in the last 168 hours. Cardiac Enzymes: No results for input(s): "CKTOTAL", "CKMB", "CKMBINDEX", "TROPONINI" in the last 168 hours. No results for input(s): "PROBNP" in the last 8760 hours. Coagulation Profile: Recent Labs  Lab 06/24/22 2013  INR 1.0   Thyroid Function Tests: Recent Labs    06/26/22 0338  TSH 3.341   Lipid Profile: No results for input(s): "CHOL", "HDL", "LDLCALC", "TRIG", "CHOLHDL", "LDLDIRECT" in the last 72 hours. Anemia Panel: Recent Labs    06/25/22 1012  VITAMINB12 258  FOLATE >40.0  FERRITIN 21  TIBC 334  IRON 22*   Urine analysis:    Component Value Date/Time   COLORURINE YELLOW 06/24/2022 2119   APPEARANCEUR CLEAR 06/24/2022 2119   LABSPEC >1.030 (H) 06/24/2022 2119   PHURINE 5.5 06/24/2022 2119   GLUCOSEU NEGATIVE 06/24/2022 2119   HGBUR NEGATIVE 06/24/2022 2119   [place] NEGATIVE 06/24/2022 2119   [place] NEGATIVE 06/24/2022 2119   PROTEINUR NEGATIVE 06/24/2022 2119   NITRITE NEGATIVE 06/24/2022 2119   LEUKOCYTESUR NEGATIVE 06/24/2022 2119   Sepsis Labs: Invalid input(s): "PROCALCITONIN", "LACTICIDVEN"  Microbiology: No results found for this or any previous visit (from the past 240 hour(s)).  Radiology Studies: No results found.    Makih Stefanko T.  Primghar  If 7PM-7AM, please contact night-coverage [email or web address] 06/26/2022, 1:24 PM

## 2022-06-26 NOTE — Op Note (Signed)
[facility] Patient Name: Kristina Osborne Procedure Date: 06/26/2022 MRN: [id number] Attending MD: Gladstone Pih. Candis Schatz , MD, [id number] Date of Birth: 28-Dec-1950 CSN: [id number] Age: 71 Admit Type: Inpatient Procedure:                Small bowel enteroscopy Indications:              Hematochezia, Melena Providers:                Nicki Reaper E. Candis Schatz, MD, Benay Pillow, RN, [facility] Technician, Technician, Brien Mates, RNFA Referring MD:              Medicines:                Monitored Anesthesia Care Complications:            No immediate complications. Estimated Blood Loss:     Estimated blood loss was minimal. Estimated blood                            loss was minimal. Procedure:                Pre-Anesthesia Assessment:                           - Prior to the procedure, a History and Physical                            was performed, and patient medications and                            allergies were reviewed. The patient's tolerance of                            previous anesthesia was also reviewed. The risks                            and benefits of the procedure and the sedation                            options and risks were discussed with the patient.                            All questions were answered, and informed consent                            was obtained. Prior Anticoagulants: The patient has                            taken no anticoagulant or antiplatelet agents. ASA                            Grade Assessment: II - A patient with mild systemic  disease. After reviewing the risks and benefits,                            the patient was deemed in satisfactory condition to                            undergo the procedure.                           After obtaining informed consent, the endoscope was                            passed under direct vision. Throughout the                             procedure, the patient's blood pressure, pulse, and                            oxygen saturations were monitored continuously. The                            [id number]-HQ190L ([id number]) Olympus colonoscope was                            introduced through the mouth and advanced to the                            jejunum, to the 140 cm mark (from the incisors).                            The small bowel enteroscopy was accomplished                            without difficulty. The patient tolerated the                            procedure well. Scope In: Scope Out: Findings:      LA Grade A (one or more mucosal breaks less than 5 mm, not extending       between tops of 2 mucosal folds) esophagitis with no bleeding was found.      The exam of the esophagus was otherwise normal.      The entire examined stomach was normal.      There was no evidence of significant pathology in the entire examined       duodenum.      A few very small angiodysplastic lesions with no bleeding were found       clustered together in the proximal jejunum. Coagulation for tissue       destruction using argon plasma was successful. Estimated blood loss:       none.      Exam of the jejunum was otherwise normal.      The distal extent of the exam was marked at 140 cm from the incisors       with an injection of 2 mL of Spot (carbon black). Estimated blood loss  was minimal. Impression:               - LA Grade A reflux esophagitis with no bleeding.                           - Normal stomach.                           - Normal examined duodenum.                           - A few non-bleeding angiodysplastic lesions in the                            duodenum. Treated with argon plasma coagulation                            (APC). These small lesions are unlikely to explain                            the patient's overt melena/hematochezia and drop in                            hemoglobin                            - Areas from the jejunum at 140 cm from the                            incisors were tattooed.                           - No specimens collected. Moderate Sedation:      Not Applicable - Patient had care per Anesthesia. Recommendation:           - Proceed with colonoscopy to evaluate for other                            potential sources of GI bleeding Procedure Code(s):        --- Professional ---                           [id number], Small intestinal endoscopy, enteroscopy                            beyond second portion of duodenum, not including                            ileum; with ablation of tumor(s), polyp(s), or                            other lesion(s) not amenable to removal by hot                            biopsy forceps, bipolar cautery or snare technique  44799, Unlisted procedure, small intestine Diagnosis Code(s):        --- Professional ---                           K21.00, Gastro-esophageal reflux disease with                            esophagitis, without bleeding                           K31.819, Angiodysplasia of stomach and duodenum                            without bleeding                           K92.1, Melena (includes Hematochezia) CPT copyright 2022 [employer]. All rights reserved. The codes documented in this report are preliminary and upon coder review may  be revised to meet current compliance requirements. [name] E. Candis Schatz, MD 06/26/2022 2:35:25 PM This report has been signed electronically. Number of Addenda: 0

## 2022-06-27 ENCOUNTER — Telehealth: Payer: Self-pay | Admitting: *Deleted

## 2022-06-27 DIAGNOSIS — D62 Acute posthemorrhagic anemia: Secondary | ICD-10-CM | POA: Diagnosis not present

## 2022-06-27 DIAGNOSIS — K922 Gastrointestinal hemorrhage, unspecified: Secondary | ICD-10-CM

## 2022-06-27 DIAGNOSIS — K5520 Angiodysplasia of colon without hemorrhage: Secondary | ICD-10-CM | POA: Diagnosis not present

## 2022-06-27 DIAGNOSIS — D126 Benign neoplasm of colon, unspecified: Secondary | ICD-10-CM

## 2022-06-27 LAB — RENAL FUNCTION PANEL
Albumin: 3.5 g/dL (ref 3.5–5.0)
Anion gap: 6 (ref 5–15)
BUN: 5 mg/dL — ABNORMAL LOW (ref 8–23)
CO2: 22 mmol/L (ref 22–32)
Calcium: 8.9 mg/dL (ref 8.9–10.3)
Chloride: 111 mmol/L (ref 98–111)
Creatinine, Ser: 0.82 mg/dL (ref 0.44–1.00)
GFR, Estimated: >60 mL/min (ref >60)
Glucose, Bld: 138 mg/dL — ABNORMAL HIGH (ref 70–99)
Phosphorus: 2.5 mg/dL (ref 2.5–4.6)
Potassium: 3.4 mmol/L — ABNORMAL LOW (ref 3.5–5.1)
Sodium: 139 mmol/L (ref 135–145)

## 2022-06-27 LAB — MAGNESIUM: Magnesium: 1.6 mg/dL — ABNORMAL LOW (ref 1.7–2.4)

## 2022-06-27 LAB — CBC
HCT: 29.6 % — ABNORMAL LOW (ref 36.0–46.0)
Hemoglobin: 9.4 g/dL — ABNORMAL LOW (ref 12.0–15.0)
MCH: 34.8 pg — ABNORMAL HIGH (ref 26.0–34.0)
MCHC: 31.8 g/dL (ref 30.0–36.0)
MCV: 109.6 fL — ABNORMAL HIGH (ref 80.0–100.0)
Platelets: 260 10*3/uL (ref 150–400)
RBC: 2.7 MIL/uL — ABNORMAL LOW (ref 3.87–5.11)
RDW: 15.9 % — ABNORMAL HIGH (ref 11.5–15.5)
WBC: 7.3 10*3/uL (ref 4.0–10.5)
nRBC: 0 % (ref 0.0–0.2)

## 2022-06-27 LAB — SURGICAL PATHOLOGY

## 2022-06-27 MED ORDER — SODIUM CHLORIDE 0.9 % IV SOLN
250.0000 mg | Freq: Once | INTRAVENOUS | Status: AC
  Administered 2022-06-27: 250 mg via INTRAVENOUS
  Filled 2022-06-27: qty 20

## 2022-06-27 MED ORDER — POTASSIUM CHLORIDE CRYS ER 20 MEQ PO TBCR
40.0000 meq | EXTENDED_RELEASE_TABLET | Freq: Once | ORAL | Status: AC
  Administered 2022-06-27: 40 meq via ORAL
  Filled 2022-06-27: qty 2

## 2022-06-27 MED ORDER — MAGNESIUM SULFATE 2 GM/50ML IV SOLN
2.0000 g | Freq: Once | INTRAVENOUS | Status: AC
  Administered 2022-06-27: 2 g via INTRAVENOUS
  Filled 2022-06-27: qty 50

## 2022-06-27 MED ORDER — POTASSIUM CHLORIDE CRYS ER 20 MEQ PO TBCR: 20.0000 meq | EXTENDED_RELEASE_TABLET | Freq: Once | ORAL | Status: DC

## 2022-06-27 NOTE — Clinical Social Work Note (Signed)
  Transition of Care [facility]) Screening Note   Patient Details  Name: Kristina Osborne Date of Birth: 1951-08-10   [facility] CM/SW Contact:    Ross Ludwig, LCSW Phone Number: 06/27/2022, 10:57 AM    Transition of Care Department [facility]) has reviewed patient and no TOC needs have been identified at this time. We will continue to monitor patient advancement through interdisciplinary progression rounds. If new patient transition needs arise, please place a TOC consult.

## 2022-06-27 NOTE — Telephone Encounter (Signed)
Received call from pt. She states she is currently an in-patient due to GI bleed. She has not needed blood transfusion this time but will be getting an iron infusion prior to today's discharge. Pt is asking if she can get weekly iron like she did in [place]. She got weekly IV iron for around 8 months.  Advised that normally DSr. Lorenso Courier does not do that. And often insurance won't pay for weekly ongoing IV iron. Pt states her insurance paid for it in [place]. Advised that I will discuss with Dr. Lorenso Courier. Pt voiced understanding.  Will arrange for labs and MD visit in the next 1-2 weeks.

## 2022-06-27 NOTE — Progress Notes (Signed)
Patient discharged home as ordered. Tolerated iron infusion well- no complications. Observed patient for about an hour after infusion completed with no issues. D/C instructions explained to pt who verbalized understanding.

## 2022-06-27 NOTE — Discharge Summary (Signed)
Physician Discharge Summary  Laketa Sandoz FWY:[id number] DOB: 25-Oct-1950 DOA: 06/24/2022  PCP: Biagio Borg, MD  Admit date: 06/24/2022 Discharge date: 06/27/2022 Admitted From: Home Disposition: Home Recommendations for Outpatient Follow-up:  Follow up with PCP in 1 to 2 weeks GI to arrange outpatient follow-up Check CMP and CBC in 1 to 2 weeks Please follow up on the following pending results: Biopsy of colonic polyps  Home Health: None Equipment/Devices: None  Discharge Condition: Stable CODE STATUS: Full code  Follow-up Information     Biagio Borg, MD. Schedule an appointment as soon as possible for a visit in 1 week(s).   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  Hgb 10.5. Hemoccult positive.  GI consulted.   Small bowel endoscopy showed esophagitis and duodenal AVM that was treated.  Colonoscopy showed Multiple non-bleeding colonic angioectasias that was thought to be the source of her bleeding.  Patient bleeding stopped.  She was given IV ferric gluconate 250 mg x 1.  She was cleared for discharge by gastroenterology for outpatient follow-up.  Capsule endoscopy results pending at the time of discharge. GI to arrange outpatient follow-up.  See individual problem list below for more.   Problems addressed during this hospitalization Principal Problem:   GI bleed Active Problems:   Thrombocytosis   AVM (arteriovenous malformation) of small bowel, acquired   ABLA (acute blood loss anemia)   Symptomatic anemia   Iron deficiency anemia   Benign neoplasm of colon   Macrocytic anemia   Symptomatic ABLA due to  recurrent GI bleed with history of AVMs: Bleeding seems to have subsided.  Some drop in Hgb could be dilutional.  Anemia panel with iron deficiency.  EGD and colonoscopy as below.  Saved IV ferric gluconate x1 prior to discharge Recent Labs    05/05/22 1149 05/19/22 1137 06/05/22 1043 06/16/22 1123 06/24/22 1421 06/25/22 0350 06/25/22 1012 06/25/22 1603 06/26/22 0338 06/27/22 0352  HGB 11.3* 10.9* 11.8* 11.0* 10.5* 8.9* 9.4* 9.0* 8.7* 9.4*  -Recheck CBC at follow-up -Follow-up capsule endoscopy result   Macrocytic anemia: Folate > 40.  Vitamin B12 level 258.   Essential thrombocythemia on hydroxyurea -Hold home hydroxyurea   Essential hypertension: Normotensive.  Not on meds.           Vital signs Vitals:   06/26/22 2351 06/27/22 0100 06/27/22 0230 06/27/22 0545  BP:    (!) 97/48  Pulse:    89  Temp: 100 F (37.8 C) 100.1 F (37.8 C) 98.3 F (36.8 C) 99.4 F (37.4 C)  Resp:    19  Height:      Weight:      SpO2:    96%  TempSrc:    Oral  BMI (Calculated):         Discharge exam  GENERAL: No apparent distress.  Nontoxic. HEENT: MMM.  Vision and hearing grossly intact.  NECK: Supple.  No apparent JVD.  RESP:  No IWOB.  Fair aeration bilaterally. CVS:  RRR. Heart sounds normal.  ABD/GI/GU: BS+. Abd soft, NTND.  MSK/EXT:  Moves extremities. No apparent deformity. No edema.  SKIN: no apparent  skin lesion or wound NEURO: Awake and alert. Oriented appropriately.  No apparent focal neuro deficit. PSYCH: Calm. Normal affect.   Discharge Instructions Discharge Instructions     Diet general   Complete by: As directed    Discharge instructions   Complete by: As directed    It has been a pleasure taking care of you!  You were hospitalized due to gastrointestinal bleeding.  We believe your bleeding is from abnormal blood vessels in your colon.  Your bleeding seems to have stopped.  Your GI doctors will let you know about the results of capsule endoscopy.   Follow-up with your primary care doctor in 1 to 2 weeks or sooner if needed.  Follow-up with gastroenterologist per their recommendation.  Avoid any over-the-counter pain medication other than plain Tylenol.   Take care,   Increase activity slowly   Complete by: As directed       Allergies as of 06/27/2022       Reactions   Aspirin Other (See Comments)   "Due to intestinal issue"   Sulfa Antibiotics Other (See Comments)   bleeding        Medication List     TAKE these medications    acetaminophen 500 MG tablet Commonly known as: TYLENOL Take 1,000 mg by mouth every 6 (six) hours as needed for mild pain or headache.   chlorhexidine 0.12 % solution Commonly known as: PERIDEX 5 mLs by Mouth Rinse route 2 (two) times daily.   cholecalciferol 25 MCG (1000 UNIT) tablet Commonly known as: VITAMIN D3 Take 1,000 Units by mouth daily.   famotidine 40 MG tablet Commonly known as: PEPCID Take 1 tablet (40 mg total) by mouth daily.   folic acid 1 MG tablet Commonly known as: FOLVITE TAKE 1 TABLET BY MOUTH EVERY DAY   glucosamine-chondroitin 500-400 MG tablet Take 1 tablet by mouth 2 (two) times daily.   hydroxyurea 500 MG capsule Commonly known as: HYDREA TAKE 1 CAPSULE BY MOUTH TWICE A DAY   metoprolol tartrate 25 MG tablet Commonly known as: LOPRESSOR Take 12.5 mg by mouth 2 (two) times daily as needed (abdormal HR).   Multivitamin Adults 50+ Tabs Take 1 tablet by mouth daily.   PRESERVISION AREDS 2+MULTI VIT PO Take 1 capsule by mouth 2 (two) times daily.   sucralfate 1 g tablet Commonly known as: CARAFATE Take 1 tablet (1 g total) by mouth 4 (four) times daily -  with meals and at bedtime. What changed: when to take this   vitamin C 1000 MG tablet Take 500 mg by mouth in the morning and at bedtime.        Consultations: Gastroenterology  Procedures/Studies: Small bowel endoscopy - LA Grade A reflux esophagitis with no bleeding. - Normal  stomach. - Normal examined duodenum. 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 Treated with argon plasma coagulation (APC). These small lesions are unlikely to explain the patient's overt melena/hematochezia and drop in hemoglobin - Areas from the jejunum at 140 cm from the incisors were tattooed.   Colonoscopy - Multiple non-bleeding colonic angioectasias. These lesions were multiple and large, involving a large surface of the right colon near the ileocolonic anastomosis. There was not active bleeding and it is not known which, if any of these vascular lesions was the culprit for the bleed. Definitive treatment with APC would involve extensive ablation of the colonic mucosa, though to be high risk for complications such as perforation. - One 5 mm polyp in the  descending colon, removed with a cold snare. Resected and retrieved. - One 4 mm polyp at the recto-sigmoid colon, removed with a cold snare. Resected and retrieved. Clip (MR conditional) was placed. Clip manufacturer: [employer]. - Decreased mucosa vascular pattern in the descending colon and in the transverse colon. Biopsied. - The examined portion of the ileum was normal.     The results of significant diagnostics from this hospitalization (including imaging, microbiology, ancillary and laboratory) are listed below for reference.     Microbiology: No results found for this or any previous visit (from the past 240 hour(s)).   Labs:  CBC: Recent Labs  Lab 06/24/22 1421 06/25/22 0350 06/25/22 1012 06/25/22 1603 06/26/22 0338 06/27/22 0352  WBC 5.4 4.4  --   --  3.2* 7.3  NEUTROABS 4.3 3.3  --   --   --   --   HGB 10.5* 8.9* 9.4* 9.0* 8.7* 9.4*  HCT 33.1* 28.5* 30.1* 28.8* 27.4* 29.6*  MCV 111.1* 113.1*  --   --  110.0* 109.6*  PLT 337 248  --   --  242 260   BMP &GFR Recent Labs  Lab 06/24/22 1421 06/25/22 0350 06/26/22 0338 06/27/22 0352  NA 139 139  --  139  K 4.2 3.5  --   3.4*  CL 106 113*  --  111  CO2 28 23  --  22  GLUCOSE 115* 105*  --  138*  BUN 15 15  --  5*  CREATININE 0.77 0.56  --  0.82  CALCIUM 9.4 8.6*  --  8.9  MG  --  2.1 1.7 1.6*  PHOS  --  3.1  --  2.5   Estimated Creatinine Clearance: 66.9 mL/min (by C-G formula based on SCr of 0.82 mg/dL). Liver & Pancreas: Recent Labs  Lab 06/24/22 1421 06/25/22 0350 06/27/22 0352  AST 24 21  --   ALT 20 16  --   ALKPHOS 57 44  --   BILITOT 0.6 0.4  --   PROT 6.5 5.3*  --   ALBUMIN 4.2 3.4* 3.5   Recent Labs  Lab 06/24/22 1421  LIPASE 47   No results for input(s): "AMMONIA" in the last 168 hours. Diabetic: No results for input(s): "HGBA1C" in the last 72 hours. No results for input(s): "GLUCAP" in the last 168 hours. Cardiac Enzymes: No results for input(s): "CKTOTAL", "CKMB", "CKMBINDEX", "TROPONINI" in the last 168 hours. No results for input(s): "PROBNP" in the last 8760 hours. Coagulation Profile: Recent Labs  Lab 06/24/22 2013  INR 1.0   Thyroid Function Tests: Recent Labs    06/26/22 0338  TSH 3.341   Lipid Profile: No results for input(s): "CHOL", "HDL", "LDLCALC", "TRIG", "CHOLHDL", "LDLDIRECT" in the last 72 hours. Anemia Panel: Recent Labs    06/25/22 1012  VITAMINB12 258  FOLATE >40.0  FERRITIN 21  TIBC 334  IRON 22*   Urine analysis:    Component Value Date/Time   COLORURINE YELLOW 06/24/2022 2119   APPEARANCEUR CLEAR 06/24/2022 2119   LABSPEC >1.030 (H) 06/24/2022 2119   PHURINE 5.5 06/24/2022 2119   GLUCOSEU NEGATIVE 06/24/2022 2119   HGBUR NEGATIVE 06/24/2022 2119   [place] NEGATIVE 06/24/2022 2119   [place] NEGATIVE 06/24/2022 2119   PROTEINUR NEGATIVE 06/24/2022 2119   NITRITE NEGATIVE 06/24/2022 2119   LEUKOCYTESUR NEGATIVE 06/24/2022 2119   Sepsis Labs: Invalid input(s): "PROCALCITONIN", "LACTICIDVEN"   SIGNED:  Mercy Riding, MD  [facility] 06/27/2022, 4:48 PM

## 2022-06-27 NOTE — Progress Notes (Cosign Needed)
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 Admitted 10/28 with recurrent GI bleeding / melena.  Seen in consultation by [place] on 10/29  Assessment / Plan   # 71 yo female with GI bleed ( melena) and acute on chronic anemia. No active bleeding on small bowel enteroscopy / colonoscopy this admission. Findings included >>mild esophagitis without bleeding, a few non-bleeding angiodysplastic lesions in duodenum ( treated with APC), multiple , large non-bleeding colonic angioectasias involving large surface of right colon, mucosa vascular pattern in the descending colon and in the transverse colon was diffusely decreased. Biopsies were taken with a cold forceps for histology.  Small bowel video capsule study completed this am. Results not available.  Some black stool after procedures yesterday but stool brown today. Hgb stable at 9.4 today (down from 11.0 earlier this month).  Scheduled to get dose of IV iron today. Ferritin is 21 Followed outpatient by Dr. Lorenso Courier. She is asking about getting routine IV iron infusion by Dr. Fuller Plan. I do think he is deferring management of IDA to Hematology but will discuss with him and our office can let her know. I will also let Dr. Lorenso Courier know that she bled again and is getting IV iron today in case he want follow up outpatient labs.   # Low grade temp last evening. Tmax 100.1. No cough, no urinary symptoms. Feels fine today. Temp 99.4 this am. WBC 7.3  # Colon polyps. A 5 mm and a 4 mm polyp were removed yesterday.  Path pending  # Thrombocytosis, on hydroxyurea. Followed by Hematology - Dr. Lorenso Courier.   Subjective   Brown stool today. Had a low grade fever and palpations last night. Tmax in Epic was 100. 1  Objective   Imaging:  No results found.  Lab Results: Recent Labs    06/25/22 0350 06/25/22 1012 06/25/22 1603  06/26/22 0338 06/27/22 0352  WBC 4.4  --   --  3.2* 7.3  HGB 8.9*   < > 9.0* 8.7* 9.4*  HCT 28.5*   < > 28.8* 27.4* 29.6*  PLT 248  --   --  242 260   < > = values in this interval not displayed.   BMET Recent Labs    06/24/22 1421 06/25/22 0350 06/27/22 0352  NA 139 139 139  K 4.2 3.5 3.4*  CL 106 113* 111  CO2 '28 23 22  '$ GLUCOSE 115* 105* 138*  BUN 15 15 5*  CREATININE 0.77 0.56 0.82  CALCIUM 9.4 8.6* 8.9   LFT Recent Labs    06/25/22 0350 06/27/22 0352  PROT 5.3*  --   ALBUMIN 3.4* 3.5  AST 21  --   ALT 16  --   ALKPHOS 44  --   BILITOT 0.4  --    PT/INR Recent Labs    06/24/22 2013  LABPROT 13.3  INR 1.0     Scheduled inpatient medications:   folic acid  1 mg Oral Daily   multivitamin with minerals  1 tablet Oral Daily   pantoprazole (PROTONIX) IV  40 mg Intravenous Q12H   potassium chloride  40 mEq Oral Once   Continuous inpatient infusions:   ferric gluconate (FERRLECIT) IVPB     PRN inpatient medications: acetaminophen, melatonin, prochlorperazine  Vital signs in last 24 hours: Temp:  [97.6 F (36.4 C)-100.1 F (37.8 C)] 99.4  F (37.4 C) (10/31 0545) Pulse Rate:  [64-91] 89 (10/31 0545) Resp:  [15-26] 19 (10/31 0545) BP: (96-120)/(48-78) 97/48 (10/31 0545) SpO2:  [89 %-100 %] 96 % (10/31 0545) Weight:  [77.1 kg] 77.1 kg (10/30 1225) Last BM Date : 06/26/22  Intake/Output Summary (Last 24 hours) at 06/27/2022 0913 Last data filed at 06/27/2022 0600 Gross per 24 hour  Intake 400 ml  Output --  Net 400 ml    Intake/Output from previous day: 10/30 0701 - 10/31 0700 In: 400 [P.O.:100; I.V.:300] Out: -  Intake/Output this shift: No intake/output data recorded.   Physical Exam:  General: Alert female in NAD Heart:  Regular rate and rhythm. No lower extremity edema Pulmonary: Normal respiratory effort Abdomen: Soft, nondistended, nontender. Normal bowel sounds.  Neurologic: Alert and oriented Psych: Pleasant. Cooperative.     Principal Problem:   GI bleed Active Problems:   Thrombocytosis   AVM (arteriovenous malformation) of small bowel, acquired   ABLA (acute blood loss anemia)   Symptomatic anemia   Iron deficiency anemia   Benign neoplasm of colon   Macrocytic anemia     LOS: 3 days   Tye Savoy ,NP 06/27/2022, 9:13 AM  -------------------------------------------------------------------------------------------------------------------------------------------------------------------------  I have taken a history, reviewed the chart and examined the patient. I performed a substantive portion of this encounter, including complete performance of at least one of the key components, in conjunction with the APP. I agree with the APP's note, impression and recommendations  71 year old female with recurrent overt GI bleeding and acute on chronic anemia, with history of bleeding from AVMs, previously requiring right hemicolectomy and jejunal resection.  Push enteroscopy with very small questionable AVMs in the proximal jejunum, otherwise no bleeding sources and no evidence of recent or active bleeding.  Colonoscopy with numerous telangiectasia like vessels throughout the colon, most concentrated in the area of the anastomosis.  No active bleeding and no evidence of recent bleeding.  Due to the size and number of the telangiectasia vessels, no attempts at ablation were made. Capsule endoscopy was reviewed yesterday evening and did show a single small AVM in the jejunum just distal to the extent of the push enteroscopy (identified by tattoo site).  No other obvious bleeding sources were identified on the capsule endoscopy.  It is possible that the patient bled from that single AVM identified on the capsule, but may be more likely she is bleeding from the vessels in the colon.  Her bleeding appears to have resolved spontaneously.  It may be beneficial for the patient to attempt angiography with her next overt  bleeding episode and consider IR embolization, as endoscopic ablative therapy for these large vessels may be complex/risky. I do think it would be advisable for her to receive outpatient iron, either IV or oral.  I understand the patient's hesitancy to take oral iron given the stool discoloration it causes, hindering her ability to know if she is bleeding or not.  Scott E. Candis Schatz, MD [facility]

## 2022-06-28 ENCOUNTER — Telehealth: Payer: Self-pay

## 2022-06-28 ENCOUNTER — Encounter (HOSPITAL_COMMUNITY): Payer: Self-pay | Admitting: Gastroenterology

## 2022-06-28 NOTE — Telephone Encounter (Signed)
Transition Care Management Follow-up Telephone Call Date of discharge and from where: [place] 06-27-22 Dx: GI bleed  How have you been since you were released from the hospital? Feeling a little better  Any questions or concerns? No  Items Reviewed: Did the pt receive and understand the discharge instructions provided? Yes  Medications obtained and verified? Yes  Other? No  Any new allergies since your discharge? No  Dietary orders reviewed? Yes Do you have support at home? Yes   Home Care and Equipment/Supplies: Were home health services ordered? no If so, what is the name of the agency? na  Has the agency set up a time to come to the patient's home? not applicable Were any new equipment or medical supplies ordered?  No What is the name of the medical supply agency? na Were you able to get the supplies/equipment? not applicable Do you have any questions related to the use of the equipment or supplies? No  Functional Questionnaire: (I = Independent and D = Dependent) ADLs: I  Bathing/Dressing- I  Meal Prep- I  Eating- I  Maintaining continence- I  Transferring/Ambulation- I  Managing Meds- I  Follow up appointments reviewed:  PCP Hospital f/u appt confirmed? Yes  Scheduled to see Dr Jenny Reichmann on 07-06-22 @ [facility] f/u appt confirmed? Yes  Scheduled to see Dr Fuller Plan on 07-05-22 @ 230pm. And Dr Lorenso Courier 07-03-22 at 220pm Are transportation arrangements needed? No  If their condition worsens, is the pt aware to call PCP or go to the Emergency Dept.? Yes Was the patient provided with contact information for the PCP's office or ED? Yes Was to pt encouraged to call back with questions or concerns? Yes   Juanda Crumble LPN [facility] Dial [phone]

## 2022-06-30 ENCOUNTER — Encounter: Payer: Self-pay | Admitting: Gastroenterology

## 2022-07-03 ENCOUNTER — Inpatient Hospital Stay: Payer: No Typology Code available for payment source | Attending: Hematology and Oncology

## 2022-07-03 ENCOUNTER — Inpatient Hospital Stay (HOSPITAL_BASED_OUTPATIENT_CLINIC_OR_DEPARTMENT_OTHER): Payer: No Typology Code available for payment source | Admitting: Hematology and Oncology

## 2022-07-03 ENCOUNTER — Other Ambulatory Visit: Payer: Self-pay

## 2022-07-03 VITALS — BP 118/59 | HR 84 | Temp 97.9°F | Resp 17 | Ht 66.0 in

## 2022-07-03 DIAGNOSIS — K922 Gastrointestinal hemorrhage, unspecified: Secondary | ICD-10-CM | POA: Diagnosis not present

## 2022-07-03 DIAGNOSIS — D500 Iron deficiency anemia secondary to blood loss (chronic): Secondary | ICD-10-CM

## 2022-07-03 DIAGNOSIS — D473 Essential (hemorrhagic) thrombocythemia: Secondary | ICD-10-CM | POA: Insufficient documentation

## 2022-07-03 DIAGNOSIS — D75839 Thrombocytosis, unspecified: Secondary | ICD-10-CM | POA: Diagnosis not present

## 2022-07-03 DIAGNOSIS — Z79899 Other long term (current) drug therapy: Secondary | ICD-10-CM | POA: Insufficient documentation

## 2022-07-03 LAB — CBC WITH DIFFERENTIAL (CANCER CENTER ONLY)
Abs Immature Granulocytes: 0.02 10*3/uL (ref 0.00–0.07)
Basophils Absolute: 0 10*3/uL (ref 0.0–0.1)
Basophils Relative: 1 %
Eosinophils Absolute: 0 10*3/uL (ref 0.0–0.5)
Eosinophils Relative: 0 %
HCT: 35 % — ABNORMAL LOW (ref 36.0–46.0)
Hemoglobin: 11.4 g/dL — ABNORMAL LOW (ref 12.0–15.0)
Immature Granulocytes: 0 %
Lymphocytes Relative: 13 %
Lymphs Abs: 0.7 10*3/uL (ref 0.7–4.0)
MCH: 35.3 pg — ABNORMAL HIGH (ref 26.0–34.0)
MCHC: 32.6 g/dL (ref 30.0–36.0)
MCV: 108.4 fL — ABNORMAL HIGH (ref 80.0–100.0)
Monocytes Absolute: 0.3 10*3/uL (ref 0.1–1.0)
Monocytes Relative: 6 %
Neutro Abs: 3.9 10*3/uL (ref 1.7–7.7)
Neutrophils Relative %: 80 %
Platelet Count: 455 10*3/uL — ABNORMAL HIGH (ref 150–400)
RBC: 3.23 MIL/uL — ABNORMAL LOW (ref 3.87–5.11)
RDW: 16 % — ABNORMAL HIGH (ref 11.5–15.5)
WBC Count: 4.9 10*3/uL (ref 4.0–10.5)
nRBC: 0 % (ref 0.0–0.2)

## 2022-07-03 LAB — IRON AND IRON BINDING CAPACITY (CC-WL,HP ONLY)
Iron: 31 ug/dL (ref 28–170)
Saturation Ratios: 9 % — ABNORMAL LOW (ref 10.4–31.8)
TIBC: 363 ug/dL (ref 250–450)
UIBC: 332 ug/dL (ref 148–442)

## 2022-07-03 LAB — RETIC PANEL
Immature Retic Fract: 24.8 % — ABNORMAL HIGH (ref 2.3–15.9)
RBC.: 3.21 MIL/uL — ABNORMAL LOW (ref 3.87–5.11)
Retic Count, Absolute: 110.4 10*3/uL (ref 19.0–186.0)
Retic Ct Pct: 3.4 % — ABNORMAL HIGH (ref 0.4–3.1)
Reticulocyte Hemoglobin: 33.7 pg (ref >27.9)

## 2022-07-03 LAB — SAMPLE TO BLOOD BANK

## 2022-07-03 LAB — FERRITIN: Ferritin: 61 ng/mL (ref 11–307)

## 2022-07-03 NOTE — Progress Notes (Addendum)
[place] [phone]) [phone]   [phone]) [phone]  PROGRESS NOTE  Patient Care Team: Biagio Borg, MD as PCP - General (Internal Medicine) Debara Pickett Nadean Corwin, MD as PCP - Cardiology (Cardiology)  Hematological/Oncological History # Iron Deficiency Anemia 2/2 go AVM of GI Tract # Essential thrombocythemia 01/11/2021: WBC 4.56, Hgb 13.5, MCV 112.6, Plt 279 03/15/2021: WBC 4.31, Hgb 14.1, MCV 123.9, Plt 349 04/08/2021: establish care with Dr. Lorenso Courier  10/26/2021: WBC 6.7, Hgb 13.3, MCV 121.7, Plt 204 03/10/2022: WBC 3.7, Hgb 11.2, MCV 120, Plt 261. Decrease hydroxyurea to 1000 mg BID due to cytopenias.  06/24/2022-06/27/2022: Admitted with worsening GI bleed.  Interval History:  Kristina Osborne 71 y.o. female with medical history significant for essential thrombocytosis who presents for a follow up visit. The patient's last visit was on 05/05/2022. In the interim since the last visit Ms. Devino had a GI bleed requiring hospitalization from 10/28-10/31/2023.   On exam today Kristina Osborne is accompanied by her husband.  She reports she has not had any further overt signs of GI bleeding since 06/22/2022.  She underwent iron infusions during her last hospital admission and since that time reports a good boost in her energy.  She notes that while her bleeding was occurring she was tired, fatigued, short of breath, and having chest pain.  She notes that she could have bowel movements with blood approximately 4-5 times in the span of the day.  The blood would normally start out as maroon, then dark, and occasionally bright red.  She reports that currently she is not having any of the symptoms and her energy is quite stable.  She also denies any signs or symptoms concerning for VTE including shortness of breath, leg swelling, or chest pain.  A full 10 point ROS is listed below.  The patient has an upcoming visit with GI to discuss the results of her capsular endoscopy on Wednesday.   Today we discussed logistics regarding checking her iron levels and hemoglobin to assure that they are not dropping to dangerous levels.  Currently her iron saturation is quite low we recommend pursuing additional round of IV iron therapy.  We will also continue to check her CBCs weekly until her labs stabilized.  The patient voiced understanding of the plan moving forward.   MEDICAL HISTORY:  Past Medical History:  Diagnosis Date   ABLA (acute blood loss anemia) 04/16/2022   Congenital malformation of intestinal fixation    Gastritis    Gastroesophageal reflux disease without esophagitis 09/10/2021   IDA (iron deficiency anemia)    Mitral valve prolapse    Thrombocytosis     SURGICAL HISTORY: Past Surgical History:  Procedure Laterality Date   BIOPSY  06/26/2022   Procedure: BIOPSY;  Surgeon: Daryel November, MD;  Location: [facility];  Service: Gastroenterology;;   COLONOSCOPY N/A 04/17/2022   Procedure: COLONOSCOPY;  Surgeon: Jerene Bears, MD;  Location: [facility];  Service: Gastroenterology;  Laterality: N/A;   COLONOSCOPY WITH PROPOFOL N/A 06/26/2022   Procedure: COLONOSCOPY WITH PROPOFOL;  Surgeon: Daryel November, MD;  Location: [facility];  Service: Gastroenterology;  Laterality: N/A;   ENTEROSCOPY N/A 04/17/2022   Procedure: ENTEROSCOPY;  Surgeon: Jerene Bears, MD;  Location: [facility];  Service: Gastroenterology;  Laterality: N/A;   ENTEROSCOPY N/A 06/26/2022   Procedure: ENTEROSCOPY;  Surgeon: Daryel November, MD;  Location: [facility];  Service: Gastroenterology;  Laterality: N/A;   GIVENS CAPSULE STUDY N/A 06/26/2022   Procedure: GIVENS CAPSULE STUDY;  Surgeon: Daryel November, MD;  Location: [facility];  Service: Gastroenterology;  Laterality: N/A;   HEMOSTASIS CLIP PLACEMENT  04/17/2022   Procedure: HEMOSTASIS CLIP PLACEMENT;  Surgeon: Jerene Bears, MD;  Location: [facility];  Service: Gastroenterology;;   HEMOSTASIS CLIP PLACEMENT   06/26/2022   Procedure: HEMOSTASIS CLIP PLACEMENT;  Surgeon: Daryel November, MD;  Location: [facility];  Service: Gastroenterology;;   HOT HEMOSTASIS N/A 04/17/2022   Procedure: HOT HEMOSTASIS (ARGON PLASMA COAGULATION/BICAP);  Surgeon: Jerene Bears, MD;  Location: [facility];  Service: Gastroenterology;  Laterality: N/A;   HOT HEMOSTASIS N/A 06/26/2022   Procedure: HOT HEMOSTASIS (ARGON PLASMA COAGULATION/BICAP);  Surgeon: Daryel November, MD;  Location: [facility];  Service: Gastroenterology;  Laterality: N/A;   POLYPECTOMY  04/17/2022   Procedure: POLYPECTOMY;  Surgeon: Jerene Bears, MD;  Location: [facility];  Service: Gastroenterology;;   POLYPECTOMY  06/26/2022   Procedure: POLYPECTOMY;  Surgeon: Daryel November, MD;  Location: [facility];  Service: Gastroenterology;;   [place] INJECTION  06/26/2022   Procedure: SUBMUCOSAL TATTOO INJECTION;  Surgeon: Daryel November, MD;  Location: [facility];  Service: Gastroenterology;;    SOCIAL HISTORY: Social History   Socioeconomic History   Marital status: Married    Spouse name: Not on file   Number of children: 0   Years of education: Not on file   Highest education level: Not on file  Occupational History   Not on file  Tobacco Use   Smoking status: Former    Years: 15.00    Types: Cigarettes   Smokeless tobacco: Never  Vaping Use   Vaping Use: Never used  Substance and Sexual Activity   Alcohol use: Not Currently   Drug use: Never   Sexual activity: Yes    Partners: Male  Other Topics Concern   Not on file  Social History Narrative   Not on file   Social Determinants of Health   Financial Resource Strain: Not on file  Food Insecurity: No Food Insecurity (06/24/2022)   Hunger Vital Sign    Worried About Running Out of Food in the Last Year: Never true    Ran Out of Food in the Last Year: Never true  Transportation Needs: No Transportation Needs  (06/24/2022)   PRAPARE - Hydrologist (Medical): No    Lack of Transportation (Non-Medical): No  Physical Activity: Not on file  Stress: Not on file  Social Connections: Not on file  Intimate Partner Violence: Not At Risk (06/24/2022)   Humiliation, Afraid, Rape, and Kick questionnaire    Fear of Current or Ex-Partner: No    Emotionally Abused: No    Physically Abused: No    Sexually Abused: No    FAMILY HISTORY: Family History  Problem Relation Age of Onset   Heart disease Mother    Valvular heart disease Mother    Healthy Sister    Alcoholism Half-Sister     ALLERGIES:  is allergic to aspirin and sulfa antibiotics.  MEDICATIONS:  Current Outpatient Medications  Medication Sig Dispense Refill   acetaminophen (TYLENOL) 500 MG tablet Take 1,000 mg by mouth every 6 (six) hours as needed for mild pain or headache.     Ascorbic Acid (VITAMIN C) 1000 MG tablet Take 500 mg by mouth in the morning and at bedtime.     chlorhexidine (PERIDEX) 0.12 % solution 5 mLs by Mouth Rinse route 2 (two) times  daily.     cholecalciferol (VITAMIN D3) 25 MCG (1000 UNIT) tablet Take 1,000 Units by mouth daily.     famotidine (PEPCID) 40 MG tablet Take 1 tablet (40 mg total) by mouth daily. 90 tablet 3   folic acid (FOLVITE) 1 MG tablet TAKE 1 TABLET BY MOUTH EVERY DAY (Patient taking differently: Take 1 mg by mouth daily.) 90 tablet 1   glucosamine-chondroitin 500-400 MG tablet Take 1 tablet by mouth 2 (two) times daily.     hydroxyurea (HYDREA) 500 MG capsule TAKE 1 CAPSULE BY MOUTH TWICE A DAY 180 capsule 1   metoprolol tartrate (LOPRESSOR) 25 MG tablet Take 12.5 mg by mouth 2 (two) times daily as needed (abdormal HR).     Multiple Vitamins-Minerals (MULTIVITAMIN ADULTS 50+) TABS Take 1 tablet by mouth daily.     Multiple Vitamins-Minerals (PRESERVISION AREDS 2+MULTI VIT PO) Take 1 capsule by mouth 2 (two) times daily.     sucralfate (CARAFATE) 1 g tablet Take 1 tablet  (1 g total) by mouth 4 (four) times daily -  with meals and at bedtime. (Patient taking differently: Take 1 g by mouth at bedtime.) 360 tablet 3   No current facility-administered medications for this visit.    REVIEW OF SYSTEMS:   Constitutional: ( - ) fevers, ( - )  chills , ( - ) night sweats Eyes: ( - ) blurriness of vision, ( - ) double vision, ( - ) watery eyes Ears, nose, mouth, throat, and face: ( - ) mucositis, ( - ) sore throat Respiratory: ( - ) cough, ( - ) dyspnea, ( - ) wheezes Cardiovascular: ( - ) palpitation, ( - ) chest discomfort, ( - ) lower extremity swelling Gastrointestinal:  ( - ) nausea, ( - ) heartburn, ( - ) change in bowel habits Skin: ( - ) abnormal skin rashes Lymphatics: ( - ) new lymphadenopathy, ( - ) easy bruising Neurological: ( - ) numbness, ( - ) tingling, ( - ) new weaknesses Behavioral/Psych: ( - ) mood change, ( - ) new changes  All other systems were reviewed with the patient and are negative.  PHYSICAL EXAMINATION: ECOG PERFORMANCE STATUS: 0 - Asymptomatic  Vitals:   07/03/22 1405  BP: (!) 118/59  Pulse: 84  Resp: 17  Temp: 97.9 F (36.6 C)  SpO2: 99%   Filed Weights    GENERAL: Well-appearing elderly Caucasian female, alert, no distress and comfortable SKIN: skin color, texture, turgor are normal, no rashes or significant lesions EYES: conjunctiva are pink and non-injected, sclera clear LUNGS: clear to auscultation and percussion with normal breathing effort HEART: regular rate & rhythm and no murmurs and no lower extremity edema Musculoskeletal: no cyanosis of digits and no clubbing  PSYCH: alert & oriented x 3, fluent speech NEURO: no focal motor/sensory deficits  LABORATORY DATA:  I have reviewed the data as listed    Latest Ref Rng & Units 07/03/2022    1:35 PM 06/27/2022    3:52 AM 06/26/2022    3:38 AM  CBC  WBC 4.0 - 10.5 K/uL 4.9  7.3  3.2   Hemoglobin 12.0 - 15.0 g/dL 11.4  9.4  8.7   Hematocrit 36.0 - 46.0 % 35.0   29.6  27.4   Platelets 150 - 400 K/uL 455  260  242        Latest Ref Rng & Units 06/27/2022    3:52 AM 06/25/2022    3:50 AM 06/24/2022    2:21 PM  CMP  Glucose 70 - 99 mg/dL 138  105  115   BUN 8 - 23 mg/dL _0 Creatinine 0.44 - 1.00 mg/dL 0.82  0.56  0.77   Sodium 135 - 145 mmol/L 139  139  139   Potassium 3.5 - 5.1 mmol/L 3.4  3.5  4.2   Chloride 98 - 111 mmol/L 111  113  106   CO2 22 - 32 mmol/L _1 Calcium 8.9 - 10.3 mg/dL 8.9  8.6  9.4   Total Protein 6.5 - 8.1 g/dL  5.3  6.5   Total Bilirubin 0.3 - 1.2 mg/dL  0.4  0.6   Alkaline Phos 38 - 126 U/L  44  57   AST 15 - 41 U/L  21  24   ALT 0 - 44 U/L  16  20    RADIOGRAPHIC STUDIES: No results found.  ASSESSMENT & PLAN Asia Dusenbury 71 y.o. female with medical history significant for essential thrombocytosis who presents for a follow up visit.   After review of the labs, review of the records, and discussion with the patient the patients findings are most consistent with thrombocytosis, reported due to essential thrombocythemia. We have received the records showing that she has a JAK2 mutation.  Additionally she will require a bone marrow biopsy in order to assure that essential thrombocythemia is the diagnosis.  In the interim we will have the patient continue her hydroxyurea as previously prescribed by her provider in [place].  # Essential thrombocythemia -- At this time we will need to confirm that the patient has essential thrombocythemia.  She will require a bone marrow biopsy to confirm this diagnosis --JAK2 testing received from patient's prior hematologist. Confirmed JAK2 mutation. Plan:   -- If and when the patient is agreeable we will schedule her for a bone marrow biopsy. --Hydroxyurea was previously held due to GI bleed and anemia.  Anemia is proving with IV iron therapy and therefore we can cotinue hydroxyurea at this time. --continue 1000 mg BID hydroxyurea.( Previously on 1500 mg  hydroxyurea alternating with 1000 mg every other day) -- Labs today show white blood cell count 4.9, hemoglobin 11.4, MCV 108.4, and platelets of 455. --Return to clinic in 3 months time with interval weekly lab check.   # Iron Deficiency Anemia 2/2 go AVM of GI Tract -- Patient failed therapy with IV iron sucrose, would recommend Monoferric 1000 mg IV x 1 dose. --Patient continues to have GI bleeding.  Appreciate assistance of GI in the management of this case. --Labs today show white blood cell count 4.9, hemoglobin 11.4, MCV 108.4, and platelets of 455.  Iron panel currently pending.   No orders of the defined types were placed in this encounter.   All questions were answered. The patient knows to call the clinic with any problems, questions or concerns.  A total of more than 30 minutes were spent on this encounter with face-to-face time and non-face-to-face time, including preparing to see the patient, ordering tests and/or medications, counseling the patient and coordination of care as outlined above.   Ledell Peoples, MD Department of Hematology/Oncology [place] at [facility] Phone: [phone] Pager: [phone] Email: [email or web address]  07/03/2022 2:37 PM

## 2022-07-04 ENCOUNTER — Telehealth: Payer: Self-pay | Admitting: Pharmacy Technician

## 2022-07-04 NOTE — Telephone Encounter (Signed)
Dr. Lorenso Courier, I will proceed with Monoferric.  I will need an updated chart note saying the patient has tried and failed venofer. I will f/u once I have a response from [facility]. Maudie Mercury

## 2022-07-04 NOTE — Telephone Encounter (Signed)
Dr. Lorenso Courier,   Monoferric is non-preferred medication for [facility] and will likely be denied to patient has not tried and or failed step therapy.   Preferred medications are Venofer. Would you like to try Venofer?

## 2022-07-05 ENCOUNTER — Ambulatory Visit (INDEPENDENT_AMBULATORY_CARE_PROVIDER_SITE_OTHER): Payer: No Typology Code available for payment source | Admitting: Gastroenterology

## 2022-07-05 ENCOUNTER — Encounter: Payer: Self-pay | Admitting: Gastroenterology

## 2022-07-05 VITALS — BP 110/60 | HR 85 | Ht 66.0 in

## 2022-07-05 DIAGNOSIS — K5520 Angiodysplasia of colon without hemorrhage: Secondary | ICD-10-CM | POA: Diagnosis not present

## 2022-07-05 DIAGNOSIS — D500 Iron deficiency anemia secondary to blood loss (chronic): Secondary | ICD-10-CM

## 2022-07-05 NOTE — Progress Notes (Signed)
Kristina Osborne, The biopsies taken from your colon were unremarkable with no evidence of chronic inflammation or dysplasia.  The polyps that were removed from your colon were not precancerous.  No further follow-up of these polyps is required.  Based on your age and absence of precancerous polyps, I recommend against further routine colon cancer screening for you.

## 2022-07-05 NOTE — Progress Notes (Signed)
    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 She is accompanied by her husband. Her SB angioectasias are the likely source of her recurrent bleeding. She was hospitalized from October 2018 to October 31 for GI bleeding.  She underwent SBE showing LA Grade A esophagitis, a few duodenal AVMs treated with APC, colonoscopy showing multiple large nonbleeding right colon angioectasias, 2 small colon polyps (hyperplastic) and VCE showing a tattoo at the distal site of SBE and one small angioectasia at 58 minutes just beyond the tattoo. Stool has been brown and she has felt well since discharge.    Labs / Imaging       Latest Ref Rng & Units 06/27/2022    3:52 AM 06/25/2022    3:50 AM 06/24/2022    2:21 PM  Hepatic Function  Total Protein 6.5 - 8.1 g/dL  5.3  6.5   Albumin 3.5 - 5.0 g/dL 3.5  3.4  4.2   AST 15 - 41 U/L  21  24   ALT 0 - 44 U/L  16  20   Alk Phosphatase 38 - 126 U/L  44  57   Total Bilirubin 0.3 - 1.2 mg/dL  0.4  0.6        Latest Ref Rng & Units 07/03/2022    1:35 PM 06/27/2022    3:52 AM 06/26/2022    3:38 AM  CBC  WBC 4.0 - 10.5 K/uL 4.9  7.3  3.2   Hemoglobin 12.0 - 15.0 g/dL 11.4  9.4  8.7   Hematocrit 36.0 - 46.0 % 35.0  29.6  27.4   Platelets 150 - 400 K/uL  455  260  242     Current Medications, Allergies, Past Medical History, Past Surgical History, Family History and Social History were reviewed in [employer] record.   Physical Exam: General: Well developed, well nourished, no acute distress Head: Normocephalic and atraumatic Eyes: Sclerae anicteric, EOMI Ears: Normal auditory acuity Mouth: Not examined Lungs: Clear throughout to auscultation Heart: Regular rate and rhythm; no murmurs, rubs or bruits Abdomen: Soft, non tender and non distended. No masses, hepatosplenomegaly or hernias noted. Normal Bowel sounds Rectal: Not done Musculoskeletal: Symmetrical with no gross deformities  Pulses:  Normal pulses noted Extremities: No clubbing, cyanosis, edema or deformities noted Neurological: Alert oriented x 4, grossly nonfocal Psychological:  Alert and cooperative. Normal mood and affect   Kristina Osborne T. Fuller Plan, MD 07/05/2022, 3:10 PM

## 2022-07-05 NOTE — Patient Instructions (Addendum)
We are going to refer your to [facility] to see Dr. Malissa Hippo. They will contact you with an appt date and time.   Follow up with your Hematologist.   The  GI providers would like to encourage you to use [facility] to communicate with providers for non-urgent requests or questions.  Due to long hold times on the telephone, sending your provider a message by [facility] may be a faster and more efficient way to get a response.  Please allow 48 business hours for a response.  Please remember that this is for non-urgent requests.   Thank you for choosing me and [facility].  Pricilla Riffle. Dagoberto Ligas., MD., Marval Regal

## 2022-07-06 ENCOUNTER — Ambulatory Visit (INDEPENDENT_AMBULATORY_CARE_PROVIDER_SITE_OTHER): Payer: No Typology Code available for payment source | Admitting: Internal Medicine

## 2022-07-06 VITALS — BP 104/60 | HR 87 | Temp 98.3°F

## 2022-07-06 DIAGNOSIS — K921 Melena: Secondary | ICD-10-CM

## 2022-07-06 DIAGNOSIS — D500 Iron deficiency anemia secondary to blood loss (chronic): Secondary | ICD-10-CM

## 2022-07-06 NOTE — Progress Notes (Signed)
Patient ID: Kristina Osborne, female   DOB: 1950-12-26, 71 y.o.   MRN: [id number]        Chief Complaint: follow up hospn oct 28 - 31       HPI:  Kristina Osborne is a 71 y.o. female here as above, doing well   Denies worsening reflux, abd pain, dysphagia, n/v, bowel change or blood.  Has some fatigue but no off balance or falls.  Kristina Osborne had no transufsion, and is s/p iron infusion. Denies worsening reflux, abd pain, dysphagia, n/v, bowel change or blood.  Already seen per Gi on Nov 8, has small bowel AVM that could possibly account for the bleeding, now referred for Dr Malissa Hippo [facility] for balloon push enteroscopy.  Also for second iron infusion tomorrow.  Kristina Osborne denies chest pain, increased sob or doe, wheezing, orthopnea, PND, increased LE swelling, palpitations, dizziness or syncope.   Kristina Osborne denies polydipsia, polyuria, or new focal neuro s/s.     BP Readings from Last 3 Encounters:  07/06/22 104/60  07/05/22 110/60  07/03/22 (!) 118/59   Transitional Care Management elements noted today: 1)  Date of D/C: as above 2)  Medication reconciliation:  done today at end visit 3)  Review of D/C summary or other information:  done today 4)  Review of need for f/u on pending diagnostic tests and treatments:  done today - for f/u cbc 5)  Review of need for Interaction with other providers who will assume or resume care of Kristina Osborne specific problems: done today - already accomplished 6)  Education of patient/family/guardian or caregiver: done today  Wt Readings from Last 3 Encounters:  06/26/22 169 lb 15.6 oz (77.1 kg)  05/25/22 170 lb (77.1 kg)  05/18/22 170 lb (77.1 kg)   BP Readings from Last 3 Encounters:  07/06/22 104/60  07/05/22 110/60  07/03/22 (!) 118/59         Past Medical History:  Diagnosis Date   ABLA (acute blood loss anemia) 04/16/2022   Congenital malformation of intestinal fixation    Gastritis    Gastroesophageal reflux disease without esophagitis 09/10/2021   IDA (iron deficiency anemia)     Mitral valve prolapse    Thrombocytosis    Past Surgical History:  Procedure Laterality Date   BIOPSY  06/26/2022   Procedure: BIOPSY;  Surgeon: Daryel November, MD;  Location: [facility];  Service: Gastroenterology;;   COLONOSCOPY N/A 04/17/2022   Procedure: COLONOSCOPY;  Surgeon: Jerene Bears, MD;  Location: [facility];  Service: Gastroenterology;  Laterality: N/A;   COLONOSCOPY WITH PROPOFOL N/A 06/26/2022   Procedure: COLONOSCOPY WITH PROPOFOL;  Surgeon: Daryel November, MD;  Location: [facility];  Service: Gastroenterology;  Laterality: N/A;   ENTEROSCOPY N/A 04/17/2022   Procedure: ENTEROSCOPY;  Surgeon: Jerene Bears, MD;  Location: [facility];  Service: Gastroenterology;  Laterality: N/A;   ENTEROSCOPY N/A 06/26/2022   Procedure: ENTEROSCOPY;  Surgeon: Daryel November, MD;  Location: [facility];  Service: Gastroenterology;  Laterality: N/A;   GIVENS CAPSULE STUDY N/A 06/26/2022   Procedure: GIVENS CAPSULE STUDY;  Surgeon: Daryel November, MD;  Location: [facility];  Service: Gastroenterology;  Laterality: N/A;   HEMOSTASIS CLIP PLACEMENT  04/17/2022   Procedure: HEMOSTASIS CLIP PLACEMENT;  Surgeon: Jerene Bears, MD;  Location: [facility];  Service: Gastroenterology;;   HEMOSTASIS CLIP PLACEMENT  06/26/2022   Procedure: HEMOSTASIS CLIP PLACEMENT;  Surgeon: Daryel November, MD;  Location: [facility];  Service: Gastroenterology;;   HOT HEMOSTASIS N/A 04/17/2022  Procedure: HOT HEMOSTASIS (ARGON PLASMA COAGULATION/BICAP);  Surgeon: Jerene Bears, MD;  Location: [facility];  Service: Gastroenterology;  Laterality: N/A;   HOT HEMOSTASIS N/A 06/26/2022   Procedure: HOT HEMOSTASIS (ARGON PLASMA COAGULATION/BICAP);  Surgeon: Daryel November, MD;  Location: [facility];  Service: Gastroenterology;  Laterality: N/A;   POLYPECTOMY  04/17/2022   Procedure: POLYPECTOMY;  Surgeon: Jerene Bears, MD;  Location: [facility];  Service: Gastroenterology;;    POLYPECTOMY  06/26/2022   Procedure: POLYPECTOMY;  Surgeon: Daryel November, MD;  Location: [facility];  Service: Gastroenterology;;   Goff INJECTION  06/26/2022   Procedure: SUBMUCOSAL TATTOO INJECTION;  Surgeon: Daryel November, MD;  Location: [facility];  Service: Gastroenterology;;    reports that she has quit smoking. Her smoking use included cigarettes. She has never used smokeless tobacco. She reports that she does not currently use alcohol. She reports that she does not use drugs. family history includes Alcoholism in her half-sister; Healthy in her sister; Heart disease in her mother; Valvular heart disease in her mother. Allergies  Allergen Reactions   Aspirin Other (See Comments)    "Due to intestinal issue"   Sulfa Antibiotics Other (See Comments)    bleeding   Current Outpatient Medications on File Prior to Visit  Medication Sig Dispense Refill   acetaminophen (TYLENOL) 500 MG tablet Take 1,000 mg by mouth every 6 (six) hours as needed for mild pain or headache.     Ascorbic Acid (VITAMIN C) 1000 MG tablet Take 500 mg by mouth in the morning and at bedtime.     chlorhexidine (PERIDEX) 0.12 % solution 5 mLs by Mouth Rinse route 2 (two) times daily.     cholecalciferol (VITAMIN D3) 25 MCG (1000 UNIT) tablet Take 1,000 Units by mouth daily.     famotidine (PEPCID) 40 MG tablet Take 1 tablet (40 mg total) by mouth daily. 90 tablet 3   folic acid (FOLVITE) 1 MG tablet TAKE 1 TABLET BY MOUTH EVERY DAY (Patient taking differently: Take 1 mg by mouth daily.) 90 tablet 1   glucosamine-chondroitin 500-400 MG tablet Take 1 tablet by mouth 2 (two) times daily.     hydroxyurea (HYDREA) 500 MG capsule TAKE 1 CAPSULE BY MOUTH TWICE A DAY 180 capsule 1   metoprolol tartrate (LOPRESSOR) 25 MG tablet Take 12.5 mg by mouth 2 (two) times daily as needed (abdormal HR).     Multiple Vitamins-Minerals (MULTIVITAMIN ADULTS 50+) TABS Take 1  tablet by mouth daily.     Multiple Vitamins-Minerals (PRESERVISION AREDS 2+MULTI VIT PO) Take 1 capsule by mouth 2 (two) times daily.     sucralfate (CARAFATE) 1 g tablet Take 1 tablet (1 g total) by mouth 4 (four) times daily -  with meals and at bedtime. (Patient taking differently: Take 1 g by mouth at bedtime.) 360 tablet 3   No current facility-administered medications on file prior to visit.        ROS:  All others reviewed and negative.  Objective        PE:  BP 104/60 (BP Location: Right Arm, Patient Position: Sitting, Cuff Size: Large)   Pulse 87   Temp 98.3 F (36.8 C) (Oral)   LMP  (LMP Unknown)   SpO2 96%                 Constitutional: Kristina Osborne appears in NAD  HENT: Head: NCAT.                Right Ear: External ear normal.                 Left Ear: External ear normal.                Eyes: . Pupils are equal, round, and reactive to light. Conjunctivae and EOM are normal               Nose: without d/c or deformity               Neck: Neck supple. Gross normal ROM               Cardiovascular: Normal rate and regular rhythm.                 Pulmonary/Chest: Effort normal and breath sounds without rales or wheezing.                Abd:  Soft, NT, ND, + BS, no organomegaly               Neurological: Kristina Osborne is alert. At baseline orientation, motor grossly intact               Skin: Skin is warm. No rashes, no other new lesions, LE edema - none               Psychiatric: Kristina Osborne behavior is normal without agitation   Micro: none  Cardiac tracings I have personally interpreted today:  none  Pertinent Radiological findings (summarize): none   Lab Results  Component Value Date   WBC 4.8 07/07/2022   HGB 11.0 (L) 07/07/2022   HCT 34.2 (L) 07/07/2022   PLT 497 (H) 07/07/2022   GLUCOSE 138 (H) 06/27/2022   ALT 16 06/25/2022   AST 21 06/25/2022   NA 139 06/27/2022   K 3.4 (L) 06/27/2022   CL 111 06/27/2022   CREATININE 0.82 06/27/2022   BUN 5 (L) 06/27/2022   CO2  22 06/27/2022   TSH 3.341 06/26/2022   INR 1.0 06/24/2022   HGBA1C 4.4 (L) 04/16/2022   Assessment/Plan:  Micaylah Bertucci is a 71 y.o. White or Caucasian [1] female with  has a past medical history of ABLA (acute blood loss anemia) (04/16/2022), Congenital malformation of intestinal fixation, Gastritis, Gastroesophageal reflux disease without esophagitis (09/10/2021), IDA (iron deficiency anemia), Mitral valve prolapse, and Thrombocytosis.  GI bleeding Overall stable, for f/u cbc as planned nov 10  Iron deficiency anemia Also for fu second iron infusion as planned tomorrow  Followup: Return in about 6 months (around 01/04/2023).  Cathlean Cower, MD 07/09/2022 12:22 PM [facility]

## 2022-07-07 ENCOUNTER — Inpatient Hospital Stay: Payer: No Typology Code available for payment source

## 2022-07-07 ENCOUNTER — Other Ambulatory Visit: Payer: Self-pay

## 2022-07-07 DIAGNOSIS — D500 Iron deficiency anemia secondary to blood loss (chronic): Secondary | ICD-10-CM

## 2022-07-07 DIAGNOSIS — D473 Essential (hemorrhagic) thrombocythemia: Secondary | ICD-10-CM | POA: Diagnosis not present

## 2022-07-07 LAB — CBC WITH DIFFERENTIAL (CANCER CENTER ONLY)
Abs Immature Granulocytes: 0.01 10*3/uL (ref 0.00–0.07)
Basophils Absolute: 0 10*3/uL (ref 0.0–0.1)
Basophils Relative: 0 %
Eosinophils Absolute: 0 10*3/uL (ref 0.0–0.5)
Eosinophils Relative: 0 %
HCT: 34.2 % — ABNORMAL LOW (ref 36.0–46.0)
Hemoglobin: 11 g/dL — ABNORMAL LOW (ref 12.0–15.0)
Immature Granulocytes: 0 %
Lymphocytes Relative: 13 %
Lymphs Abs: 0.6 10*3/uL — ABNORMAL LOW (ref 0.7–4.0)
MCH: 34.7 pg — ABNORMAL HIGH (ref 26.0–34.0)
MCHC: 32.2 g/dL (ref 30.0–36.0)
MCV: 107.9 fL — ABNORMAL HIGH (ref 80.0–100.0)
Monocytes Absolute: 0.4 10*3/uL (ref 0.1–1.0)
Monocytes Relative: 8 %
Neutro Abs: 3.7 10*3/uL (ref 1.7–7.7)
Neutrophils Relative %: 79 %
Platelet Count: 497 10*3/uL — ABNORMAL HIGH (ref 150–400)
RBC: 3.17 MIL/uL — ABNORMAL LOW (ref 3.87–5.11)
RDW: 15.3 % (ref 11.5–15.5)
WBC Count: 4.8 10*3/uL (ref 4.0–10.5)
nRBC: 0 % (ref 0.0–0.2)

## 2022-07-07 LAB — IRON AND IRON BINDING CAPACITY (CC-WL,HP ONLY)
Iron: 32 ug/dL (ref 28–170)
Saturation Ratios: 9 % — ABNORMAL LOW (ref 10.4–31.8)
TIBC: 368 ug/dL (ref 250–450)
UIBC: 336 ug/dL (ref 148–442)

## 2022-07-07 LAB — FERRITIN: Ferritin: 49 ng/mL (ref 11–307)

## 2022-07-09 ENCOUNTER — Encounter: Payer: Self-pay | Admitting: Internal Medicine

## 2022-07-09 NOTE — Assessment & Plan Note (Signed)
Also for fu second iron infusion as planned tomorrow

## 2022-07-09 NOTE — Patient Instructions (Signed)
Please continue all other medications as before, and refills have been done if requested.  Please have the pharmacy call with any other refills you may need.  Please continue your efforts at being more active, low cholesterol diet, and weight control.  You are otherwise up to date with prevention measures today.  Please keep your appointments with your specialists as you may have planned - f/u cbc as planned  Please make an Appointment to return in 6 months, or sooner if needed

## 2022-07-09 NOTE — Assessment & Plan Note (Signed)
Overall stable, for f/u cbc as planned nov 10

## 2022-07-10 ENCOUNTER — Other Ambulatory Visit: Payer: Self-pay

## 2022-07-10 ENCOUNTER — Inpatient Hospital Stay: Payer: No Typology Code available for payment source

## 2022-07-10 ENCOUNTER — Telehealth: Payer: Self-pay | Admitting: *Deleted

## 2022-07-10 DIAGNOSIS — D473 Essential (hemorrhagic) thrombocythemia: Secondary | ICD-10-CM | POA: Diagnosis not present

## 2022-07-10 DIAGNOSIS — D500 Iron deficiency anemia secondary to blood loss (chronic): Secondary | ICD-10-CM

## 2022-07-10 LAB — CBC WITH DIFFERENTIAL (CANCER CENTER ONLY)
Abs Immature Granulocytes: 0.01 10*3/uL (ref 0.00–0.07)
Basophils Absolute: 0 10*3/uL (ref 0.0–0.1)
Basophils Relative: 1 %
Eosinophils Absolute: 0 10*3/uL (ref 0.0–0.5)
Eosinophils Relative: 0 %
HCT: 33.1 % — ABNORMAL LOW (ref 36.0–46.0)
Hemoglobin: 10.6 g/dL — ABNORMAL LOW (ref 12.0–15.0)
Immature Granulocytes: 0 %
Lymphocytes Relative: 13 %
Lymphs Abs: 0.7 10*3/uL (ref 0.7–4.0)
MCH: 34.8 pg — ABNORMAL HIGH (ref 26.0–34.0)
MCHC: 32 g/dL (ref 30.0–36.0)
MCV: 108.5 fL — ABNORMAL HIGH (ref 80.0–100.0)
Monocytes Absolute: 0.4 10*3/uL (ref 0.1–1.0)
Monocytes Relative: 7 %
Neutro Abs: 4.2 10*3/uL (ref 1.7–7.7)
Neutrophils Relative %: 79 %
Platelet Count: 509 10*3/uL — ABNORMAL HIGH (ref 150–400)
RBC: 3.05 MIL/uL — ABNORMAL LOW (ref 3.87–5.11)
RDW: 15.7 % — ABNORMAL HIGH (ref 11.5–15.5)
WBC Count: 5.3 10*3/uL (ref 4.0–10.5)
nRBC: 0 % (ref 0.0–0.2)

## 2022-07-10 LAB — IRON AND IRON BINDING CAPACITY (CC-WL,HP ONLY)
Iron: 26 ug/dL — ABNORMAL LOW (ref 28–170)
Saturation Ratios: 7 % — ABNORMAL LOW (ref 10.4–31.8)
TIBC: 356 ug/dL (ref 250–450)
UIBC: 330 ug/dL (ref 148–442)

## 2022-07-10 LAB — FERRITIN: Ferritin: 32 ng/mL (ref 11–307)

## 2022-07-10 NOTE — Telephone Encounter (Signed)
Received call from pt. She states she needs to have her iron infusion prior to the Thanks giving holiday. Per [employer], they need a note from Dr. Lorenso Courier stating that the Venofer was not effective in raising her iron levels. Dr.Dorsey has ordered Monoferric.  Made Dr. Lorenso Courier aware of need for note in patient's chart regarding the failure of Venofer and the need for Monoferric.

## 2022-07-13 ENCOUNTER — Telehealth: Payer: Self-pay | Admitting: Gastroenterology

## 2022-07-13 ENCOUNTER — Telehealth: Payer: Self-pay | Admitting: Pharmacy Technician

## 2022-07-13 NOTE — Telephone Encounter (Signed)
Left message for patient to call back  

## 2022-07-13 NOTE — Telephone Encounter (Signed)
Diarrhea will not make her bleed - let her know  Is she having signs of bleeding - any melena/black stools, red stools  Abd pain, fever? (? Infection causing diarrhea)  Questran used for chronic diarrhea - would want to see if this is transient or not before restarting that I think  She could try Imodium AD  Let me know

## 2022-07-13 NOTE — Telephone Encounter (Signed)
Patient called in requesting Questran. She was on it years ago for diarrhea & it worked well. She says she has had diarrhea occasionally for months, and just yesterday went 8 times. No other symptoms currently. She's concerned that if diarrhea isn't controlled that it could flare up her bleeding again with AVM's. Last seen in [place] with Dr. Fuller Plan on 07/05/22.

## 2022-07-13 NOTE — Telephone Encounter (Signed)
Inbound call from patient seeking advice if there is anyway she can get a prescription for Questran. Patient stated that she is going to the bathroom at least 7 times a day. Please advise.

## 2022-07-13 NOTE — Telephone Encounter (Addendum)
F/U: Iron infusion.  Linus Orn, Wanted to f/u with the monoferric infusion.  We will need appropriate documentation stating patient has tried and or failed venofer before I can proceed with the monoferric authorization. Please let me know if monoferric is still the choice of treatment.  Thanks Maudie Mercury

## 2022-07-14 ENCOUNTER — Telehealth: Payer: Self-pay | Admitting: Internal Medicine

## 2022-07-14 ENCOUNTER — Telehealth: Payer: Self-pay | Admitting: *Deleted

## 2022-07-14 NOTE — Telephone Encounter (Signed)
Spoke with patient regarding MD recommendations. Denies abdominal pain, fever. Stools are normal in color/consistency, no signs of bleeding. Advised she call back if symptoms do not improve or if imodium does not provide relief.

## 2022-07-14 NOTE — Telephone Encounter (Signed)
Pt called to ask if she can have [place] for diarrhea (8 times/day) for 6 days. The OTC stuff turns stool black.   RX Requested:  [place]:  [facility] [place]  Last visit:  06/28/22  Pt phone [phone]

## 2022-07-14 NOTE — Telephone Encounter (Signed)
TCT patient regarding her IV iron-pt had called about this earlier today. No answer. LVM for patient advising that we are working with [employer] regarding her iron infusion. Advised to expect to hear from them by Monday or Tuesday next week. Advised to call back with any questions or concerns to [phone].

## 2022-07-17 ENCOUNTER — Inpatient Hospital Stay (HOSPITAL_BASED_OUTPATIENT_CLINIC_OR_DEPARTMENT_OTHER): Payer: No Typology Code available for payment source

## 2022-07-17 ENCOUNTER — Other Ambulatory Visit: Payer: Self-pay

## 2022-07-17 DIAGNOSIS — D473 Essential (hemorrhagic) thrombocythemia: Secondary | ICD-10-CM | POA: Diagnosis not present

## 2022-07-17 DIAGNOSIS — D500 Iron deficiency anemia secondary to blood loss (chronic): Secondary | ICD-10-CM

## 2022-07-17 LAB — IRON AND IRON BINDING CAPACITY (CC-WL,HP ONLY)
Iron: 21 ug/dL — ABNORMAL LOW (ref 28–170)
Saturation Ratios: 6 % — ABNORMAL LOW (ref 10.4–31.8)
TIBC: 385 ug/dL (ref 250–450)
UIBC: 364 ug/dL (ref 148–442)

## 2022-07-17 LAB — CBC WITH DIFFERENTIAL (CANCER CENTER ONLY)
Abs Immature Granulocytes: 0.02 10*3/uL (ref 0.00–0.07)
Basophils Absolute: 0 10*3/uL (ref 0.0–0.1)
Basophils Relative: 1 %
Eosinophils Absolute: 0 10*3/uL (ref 0.0–0.5)
Eosinophils Relative: 0 %
HCT: 34.7 % — ABNORMAL LOW (ref 36.0–46.0)
Hemoglobin: 11.2 g/dL — ABNORMAL LOW (ref 12.0–15.0)
Immature Granulocytes: 0 %
Lymphocytes Relative: 13 %
Lymphs Abs: 0.6 10*3/uL — ABNORMAL LOW (ref 0.7–4.0)
MCH: 34.9 pg — ABNORMAL HIGH (ref 26.0–34.0)
MCHC: 32.3 g/dL (ref 30.0–36.0)
MCV: 108.1 fL — ABNORMAL HIGH (ref 80.0–100.0)
Monocytes Absolute: 0.4 10*3/uL (ref 0.1–1.0)
Monocytes Relative: 8 %
Neutro Abs: 4 10*3/uL (ref 1.7–7.7)
Neutrophils Relative %: 78 %
Platelet Count: 429 10*3/uL — ABNORMAL HIGH (ref 150–400)
RBC: 3.21 MIL/uL — ABNORMAL LOW (ref 3.87–5.11)
RDW: 15.1 % (ref 11.5–15.5)
WBC Count: 5.1 10*3/uL (ref 4.0–10.5)
nRBC: 0 % (ref 0.0–0.2)

## 2022-07-17 LAB — FERRITIN: Ferritin: 20 ng/mL (ref 11–307)

## 2022-07-17 MED ORDER — CHOLESTYRAMINE LIGHT 4 G PO PACK: 4.0000 g | PACK | Freq: Two times a day (BID) | ORAL | 3 refills | Status: DC

## 2022-07-17 NOTE — Telephone Encounter (Signed)
Please advise for patient request for Questran as OTC diarrhea meds caused dark stool.

## 2022-07-17 NOTE — Telephone Encounter (Signed)
Ok this is done 

## 2022-07-19 ENCOUNTER — Ambulatory Visit: Payer: No Typology Code available for payment source | Admitting: Gastroenterology

## 2022-07-24 ENCOUNTER — Other Ambulatory Visit: Payer: Self-pay

## 2022-07-24 ENCOUNTER — Telehealth: Payer: Self-pay | Admitting: *Deleted

## 2022-07-24 ENCOUNTER — Inpatient Hospital Stay: Payer: No Typology Code available for payment source

## 2022-07-24 DIAGNOSIS — D500 Iron deficiency anemia secondary to blood loss (chronic): Secondary | ICD-10-CM

## 2022-07-24 DIAGNOSIS — D473 Essential (hemorrhagic) thrombocythemia: Secondary | ICD-10-CM | POA: Diagnosis not present

## 2022-07-24 LAB — CBC WITH DIFFERENTIAL (CANCER CENTER ONLY)
Abs Immature Granulocytes: 0.01 10*3/uL (ref 0.00–0.07)
Basophils Absolute: 0 10*3/uL (ref 0.0–0.1)
Basophils Relative: 1 %
Eosinophils Absolute: 0 10*3/uL (ref 0.0–0.5)
Eosinophils Relative: 0 %
HCT: 32 % — ABNORMAL LOW (ref 36.0–46.0)
Hemoglobin: 10.3 g/dL — ABNORMAL LOW (ref 12.0–15.0)
Immature Granulocytes: 0 %
Lymphocytes Relative: 11 %
Lymphs Abs: 0.5 10*3/uL — ABNORMAL LOW (ref 0.7–4.0)
MCH: 34.2 pg — ABNORMAL HIGH (ref 26.0–34.0)
MCHC: 32.2 g/dL (ref 30.0–36.0)
MCV: 106.3 fL — ABNORMAL HIGH (ref 80.0–100.0)
Monocytes Absolute: 0.3 10*3/uL (ref 0.1–1.0)
Monocytes Relative: 7 %
Neutro Abs: 3.6 10*3/uL (ref 1.7–7.7)
Neutrophils Relative %: 81 %
Platelet Count: 257 10*3/uL (ref 150–400)
RBC: 3.01 MIL/uL — ABNORMAL LOW (ref 3.87–5.11)
RDW: 14.7 % (ref 11.5–15.5)
WBC Count: 4.4 10*3/uL (ref 4.0–10.5)
nRBC: 0 % (ref 0.0–0.2)

## 2022-07-24 LAB — IRON AND IRON BINDING CAPACITY (CC-WL,HP ONLY)
Iron: 16 ug/dL — ABNORMAL LOW (ref 28–170)
Saturation Ratios: 4 % — ABNORMAL LOW (ref 10.4–31.8)
TIBC: 413 ug/dL (ref 250–450)
UIBC: 397 ug/dL (ref 148–442)

## 2022-07-24 LAB — FERRITIN: Ferritin: 13 ng/mL (ref 11–307)

## 2022-07-24 NOTE — Telephone Encounter (Signed)
Received vm message from pt stating she had labs done today. It appears that her HGB dropped 1 point and iron sat% is down as well. Ferritin is not back yet. Pt states she has not seen any overt bleeding. She has not gotten IV iron in a while. The last order didn't quite go through.  Dr. Henrine Screws see message from [place] from 07/13/22  Please advise if pt requires more IV iron.

## 2022-07-26 ENCOUNTER — Encounter: Payer: Self-pay | Admitting: Hematology and Oncology

## 2022-07-26 ENCOUNTER — Telehealth: Payer: Self-pay | Admitting: *Deleted

## 2022-07-26 NOTE — Telephone Encounter (Signed)
Dr. Lorenso Courier, Juluis Rainier note:   Auth Submission: APPROVED Payer: [employer] Medication & CPT/J Code(s) submitted: Monoferric Sabino Dick derisomaltose) [phone] Route of submission (phone, fax, portal):  Phone [phone] Fax [phone] Auth type: Buy/Bill Units/visits requested: X1 Reference number: [id number] Approval from: 07/26/22 to 10/25/22   Patient will be scheduled as soon as possible.

## 2022-07-26 NOTE — Telephone Encounter (Signed)
Received call from pt inquiring about her lab results from yesterday. Advised that her Ferritin and Iron sat and HGB have all decreased since last week. Advised that the pharmacist @ [facility] is working on getting pt's Monoferric authorized through [employer]. Hopefully pt will be able to get her IV iron soon. Pt's next appt is on 07/31/22 for labs and MD clinic visit. Pt voiced understanding.

## 2022-07-28 ENCOUNTER — Ambulatory Visit: Payer: No Typology Code available for payment source | Admitting: Hematology and Oncology

## 2022-07-28 ENCOUNTER — Ambulatory Visit (INDEPENDENT_AMBULATORY_CARE_PROVIDER_SITE_OTHER): Payer: No Typology Code available for payment source

## 2022-07-28 ENCOUNTER — Other Ambulatory Visit: Payer: No Typology Code available for payment source

## 2022-07-28 VITALS — BP 99/63 | HR 61 | Temp 98.1°F | Resp 16 | Ht 66.0 in | Wt 170.0 lb

## 2022-07-28 DIAGNOSIS — D500 Iron deficiency anemia secondary to blood loss (chronic): Secondary | ICD-10-CM

## 2022-07-28 MED ORDER — SODIUM CHLORIDE 0.9 % IV SOLN
1000.0000 mg | Freq: Once | INTRAVENOUS | Status: AC
  Administered 2022-07-28: 1000 mg via INTRAVENOUS
  Filled 2022-07-28: qty 10

## 2022-07-28 NOTE — Progress Notes (Signed)
Diagnosis: Iron Deficiency Anemia  Provider:  Marshell Garfinkel MD  Procedure: Infusion  IV Type: Peripheral, IV Location: R Forearm  Monoferric (Ferric Derisomaltose), Dose: 1000 mg  Infusion Start Time: 1430  Infusion Stop Time: 9629  Post Infusion IV Care: Observation period completed and Peripheral IV Discontinued  Discharge: Condition: Good, Destination: Home . AVS provided to patient.   Performed by:  Koren Shiver, RN

## 2022-07-31 ENCOUNTER — Other Ambulatory Visit: Payer: Self-pay

## 2022-07-31 ENCOUNTER — Inpatient Hospital Stay (HOSPITAL_BASED_OUTPATIENT_CLINIC_OR_DEPARTMENT_OTHER): Payer: No Typology Code available for payment source | Admitting: Hematology and Oncology

## 2022-07-31 ENCOUNTER — Inpatient Hospital Stay: Payer: No Typology Code available for payment source | Attending: Hematology and Oncology

## 2022-07-31 VITALS — BP 114/61 | HR 75 | Temp 98.3°F | Resp 15

## 2022-07-31 DIAGNOSIS — D473 Essential (hemorrhagic) thrombocythemia: Secondary | ICD-10-CM | POA: Insufficient documentation

## 2022-07-31 DIAGNOSIS — Z79899 Other long term (current) drug therapy: Secondary | ICD-10-CM | POA: Diagnosis not present

## 2022-07-31 DIAGNOSIS — D500 Iron deficiency anemia secondary to blood loss (chronic): Secondary | ICD-10-CM

## 2022-07-31 DIAGNOSIS — D75839 Thrombocytosis, unspecified: Secondary | ICD-10-CM

## 2022-07-31 LAB — CBC WITH DIFFERENTIAL (CANCER CENTER ONLY)
Abs Immature Granulocytes: 0.01 10*3/uL (ref 0.00–0.07)
Basophils Absolute: 0 10*3/uL (ref 0.0–0.1)
Basophils Relative: 1 %
Eosinophils Absolute: 0 10*3/uL (ref 0.0–0.5)
Eosinophils Relative: 1 %
HCT: 30.7 % — ABNORMAL LOW (ref 36.0–46.0)
Hemoglobin: 9.8 g/dL — ABNORMAL LOW (ref 12.0–15.0)
Immature Granulocytes: 0 %
Lymphocytes Relative: 12 %
Lymphs Abs: 0.5 10*3/uL — ABNORMAL LOW (ref 0.7–4.0)
MCH: 33.6 pg (ref 26.0–34.0)
MCHC: 31.9 g/dL (ref 30.0–36.0)
MCV: 105.1 fL — ABNORMAL HIGH (ref 80.0–100.0)
Monocytes Absolute: 0.5 10*3/uL (ref 0.1–1.0)
Monocytes Relative: 11 %
Neutro Abs: 3.3 10*3/uL (ref 1.7–7.7)
Neutrophils Relative %: 75 %
Platelet Count: 190 10*3/uL (ref 150–400)
RBC: 2.92 MIL/uL — ABNORMAL LOW (ref 3.87–5.11)
RDW: 14.7 % (ref 11.5–15.5)
WBC Count: 4.4 10*3/uL (ref 4.0–10.5)
nRBC: 0 % (ref 0.0–0.2)

## 2022-07-31 LAB — IRON AND IRON BINDING CAPACITY (CC-WL,HP ONLY)
Iron: 247 ug/dL — ABNORMAL HIGH (ref 28–170)
Saturation Ratios: 72 % — ABNORMAL HIGH (ref 10.4–31.8)
TIBC: 344 ug/dL (ref 250–450)
UIBC: 97 ug/dL

## 2022-08-01 ENCOUNTER — Encounter: Payer: Self-pay | Admitting: Hematology and Oncology

## 2022-08-01 LAB — FERRITIN: Ferritin: 212 ng/mL (ref 11–307)

## 2022-08-01 NOTE — Progress Notes (Signed)
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 Lorenso Courier  10/26/2021: WBC 6.7, Hgb 13.3, MCV 121.7, Plt 204 03/10/2022: WBC 3.7, Hgb 11.2, MCV 120, Plt 261. Decrease hydroxyurea to 1000 mg BID due to cytopenias.  06/24/2022-06/27/2022: Admitted with worsening GI bleed. Hydroxyurea held.  08/01/2022: WBC 4.4, hemoglobin 9.8, MCV 105.1, and platelets of 190  Interval History:  Kristina Osborne 71 y.o. female with medical history significant for essential thrombocytosis who presents for a follow up visit. The patient's last visit was on 07/03/2022. In the interim since the last visit [name] has received IV monoferric on 07/28/2022 and taken hydroxyurea 500 mg PO daily.   On exam today [name] is accompanied by her husband.  She reports he had a good Thanksgiving.  She is currently only taking 1 hydroxyurea pill daily.  She reports her last dose of IV iron went on Friday and she tolerated quite well.  She reports over the last 6 weeks she has had no issues with bleeding.  She has not seen any maroon-colored stools, dark stools, or bright red blood in the stool.  She reports that she does fatigue more easily.  And feels like she has much lower energy.  She reports that her appetite has been good and she is doing her best to stay hydrated.  She denies having any issues with nausea, vomiting, or diarrhea.  She developed no ulcers on her ankles or in her mouth.  She also denies any signs or symptoms concerning for VTE including shortness of breath, leg swelling, or chest pain.  A full 10  point ROS is listed below.   MEDICAL HISTORY:  Past Medical History:  Diagnosis Date   ABLA (acute blood loss anemia) 04/16/2022   Congenital malformation of intestinal fixation    Gastritis    Gastroesophageal reflux disease without esophagitis 09/10/2021   IDA (iron deficiency anemia)    Mitral valve prolapse    Thrombocytosis     SURGICAL HISTORY: Past Surgical History:  Procedure Laterality Date   BIOPSY  06/26/2022   Procedure: BIOPSY;  Surgeon: Daryel November, MD;  Location: [facility];  Service: Gastroenterology;;   COLONOSCOPY N/A 04/17/2022   Procedure: COLONOSCOPY;  Surgeon: Jerene Bears, MD;  Location: [facility];  Service: Gastroenterology;  Laterality: N/A;   COLONOSCOPY WITH PROPOFOL N/A 06/26/2022   Procedure: COLONOSCOPY WITH PROPOFOL;  Surgeon: Daryel November, MD;  Location: [facility];  Service: Gastroenterology;  Laterality: N/A;   ENTEROSCOPY N/A 04/17/2022   Procedure: ENTEROSCOPY;  Surgeon: Jerene Bears, MD;  Location: [facility];  Service: Gastroenterology;  Laterality: N/A;   ENTEROSCOPY N/A 06/26/2022   Procedure: ENTEROSCOPY;  Surgeon: Daryel November, MD;  Location: [facility];  Service: Gastroenterology;  Laterality: N/A;   GIVENS CAPSULE STUDY N/A 06/26/2022   Procedure: GIVENS CAPSULE STUDY;  Surgeon: Daryel November, MD;  Location: [facility];  Service: Gastroenterology;  Laterality: N/A;   HEMOSTASIS CLIP PLACEMENT  04/17/2022   Procedure: HEMOSTASIS CLIP PLACEMENT;  Surgeon: Jerene Bears, MD;  Location: [facility];  Service: Gastroenterology;;   HEMOSTASIS CLIP PLACEMENT  06/26/2022   Procedure: HEMOSTASIS CLIP PLACEMENT;  Surgeon: Daryel November, MD;  Location: [facility];  Service: Gastroenterology;;   HOT HEMOSTASIS N/A 04/17/2022   Procedure: HOT HEMOSTASIS (ARGON PLASMA COAGULATION/BICAP);  Surgeon: Jerene Bears, MD;  Location: [facility];  Service: Gastroenterology;  Laterality: N/A;   HOT HEMOSTASIS N/A  06/26/2022   Procedure: HOT HEMOSTASIS (ARGON PLASMA COAGULATION/BICAP);  Surgeon: Daryel November, MD;  Location: [facility];  Service: Gastroenterology;  Laterality: N/A;   POLYPECTOMY  04/17/2022   Procedure: POLYPECTOMY;  Surgeon: Jerene Bears, MD;  Location: [facility];  Service: Gastroenterology;;   POLYPECTOMY  06/26/2022   Procedure: POLYPECTOMY;  Surgeon: Daryel November, MD;  Location: [facility];  Service: Gastroenterology;;   [place] INJECTION  06/26/2022   Procedure: SUBMUCOSAL TATTOO INJECTION;  Surgeon: Daryel November, MD;  Location: [facility];  Service: Gastroenterology;;    SOCIAL HISTORY: Social History   Socioeconomic History   Marital status: Married    Spouse name: Not on file   Number of children: 0   Years of education: Not on file   Highest education level: Not on file  Occupational History   Not on file  Tobacco Use   Smoking status: Former    Years: 15.00    Types: Cigarettes   Smokeless tobacco: Never  Vaping Use   Vaping Use: Never used  Substance and Sexual Activity   Alcohol use: Not Currently   Drug use: Never   Sexual activity: Yes    Partners: Male  Other Topics Concern   Not on file  Social History Narrative   Not on file   Social Determinants of Health   Financial Resource Strain: Not on file  Food Insecurity: No Food Insecurity (06/24/2022)   Hunger Vital Sign    Worried About Running Out of Food in the Last Year: Never true    Ran Out of Food in the Last Year: Never true  Transportation Needs: No Transportation Needs (06/24/2022)   PRAPARE - Hydrologist (Medical): No    Lack of Transportation (Non-Medical): No  Physical Activity: Not on file  Stress: Not on file  Social Connections: Not on file  Intimate Partner Violence: Not At Risk (06/24/2022)   Humiliation, Afraid, Rape, and Kick questionnaire    Fear of Current or Ex-Partner: No     Emotionally Abused: No    Physically Abused: No    Sexually Abused: No    FAMILY HISTORY: Family History  Problem Relation Age of Onset   Heart disease Mother    Valvular heart disease Mother    Healthy Sister    Alcoholism Half-Sister     ALLERGIES:  is allergic to aspirin and sulfa antibiotics.  MEDICATIONS:  Current Outpatient Medications  Medication Sig Dispense Refill   acetaminophen (TYLENOL) 500 MG tablet Take 1,000 mg by mouth every 6 (six) hours as needed for mild pain or headache.     Ascorbic Acid (VITAMIN C) 1000 MG tablet Take 500 mg by mouth in the morning and at bedtime.     chlorhexidine (PERIDEX) 0.12 % solution 5 mLs by Mouth Rinse route 2 (two) times daily.     cholecalciferol (VITAMIN D3) 25 MCG (1000 UNIT) tablet Take 1,000 Units by mouth daily.     cholestyramine light (PREVALITE) 4 g packet Take 1 packet (4 g total) by mouth 2 (two) times daily. 180 each 3   famotidine (  PEPCID) 40 MG tablet Take 1 tablet (40 mg total) by mouth daily. 90 tablet 3   folic acid (FOLVITE) 1 MG tablet TAKE 1 TABLET BY MOUTH EVERY DAY (Patient taking differently: Take 1 mg by mouth daily.) 90 tablet 1   glucosamine-chondroitin 500-400 MG tablet Take 1 tablet by mouth 2 (two) times daily.     hydroxyurea (HYDREA) 500 MG capsule TAKE 1 CAPSULE BY MOUTH TWICE A DAY 180 capsule 1   metoprolol tartrate (LOPRESSOR) 25 MG tablet Take 12.5 mg by mouth 2 (two) times daily as needed (abdormal HR).     Multiple Vitamins-Minerals (MULTIVITAMIN ADULTS 50+) TABS Take 1 tablet by mouth daily.     Multiple Vitamins-Minerals (PRESERVISION AREDS 2+MULTI VIT PO) Take 1 capsule by mouth 2 (two) times daily.     sucralfate (CARAFATE) 1 g tablet Take 1 tablet (1 g total) by mouth 4 (four) times daily -  with meals and at bedtime. (Patient taking differently: Take 1 g by mouth at bedtime.) 360 tablet 3   No current facility-administered medications for this visit.    REVIEW OF SYSTEMS:    Constitutional: ( - ) fevers, ( - )  chills , ( - ) night sweats Eyes: ( - ) blurriness of vision, ( - ) double vision, ( - ) watery eyes Ears, nose, mouth, throat, and face: ( - ) mucositis, ( - ) sore throat Respiratory: ( - ) cough, ( - ) dyspnea, ( - ) wheezes Cardiovascular: ( - ) palpitation, ( - ) chest discomfort, ( - ) lower extremity swelling Gastrointestinal:  ( - ) nausea, ( - ) heartburn, ( - ) change in bowel habits Skin: ( - ) abnormal skin rashes Lymphatics: ( - ) new lymphadenopathy, ( - ) easy bruising Neurological: ( - ) numbness, ( - ) tingling, ( - ) new weaknesses Behavioral/Psych: ( - ) mood change, ( - ) new changes  All other systems were reviewed with the patient and are negative.  PHYSICAL EXAMINATION: ECOG PERFORMANCE STATUS: 0 - Asymptomatic  Vitals:   07/31/22 1555  BP: 114/61  Pulse: 75  Resp: 15  Temp: 98.3 F (36.8 C)  SpO2: 97%   There were no vitals filed for this visit.   GENERAL: Well-appearing elderly Caucasian female, alert, no distress and comfortable SKIN: skin color, texture, turgor are normal, no rashes or significant lesions EYES: conjunctiva are pink and non-injected, sclera clear LUNGS: clear to auscultation and percussion with normal breathing effort HEART: regular rate & rhythm and no murmurs and no lower extremity edema Musculoskeletal: no cyanosis of digits and no clubbing  PSYCH: alert & oriented x 3, fluent speech NEURO: no focal motor/sensory deficits  LABORATORY DATA:  I have reviewed the data as listed    Latest Ref Rng & Units 07/31/2022    3:15 PM 07/24/2022   11:55 AM 07/17/2022   11:42 AM  CBC  WBC 4.0 - 10.5 K/uL 4.4  4.4  5.1   Hemoglobin 12.0 - 15.0 g/dL 9.8  10.3  11.2   Hematocrit 36.0 - 46.0 % 30.7  32.0  34.7   Platelets 150 - 400 K/uL 190  257  429        Latest Ref Rng & Units 06/27/2022    3:52 AM 06/25/2022    3:50 AM 06/24/2022    2:21 PM  CMP  Glucose 70 - 99 mg/dL 138  105  115   BUN 8 -  23 mg/dL 5  15  15   Creatinine 0.44 - 1.00 mg/dL 0.82  0.56  0.77   Sodium 135 - 145 mmol/L 139  139  139   Potassium 3.5 - 5.1 mmol/L 3.4  3.5  4.2   Chloride 98 - 111 mmol/L 111  113  106   CO2 22 - 32 mmol/L 22  23  28   Calcium 8.9 - 10.3 mg/dL 8.9  8.6  9.4   Total Protein 6.5 - 8.1 g/dL  5.3  6.5   Total Bilirubin 0.3 - 1.2 mg/dL  0.4  0.6   Alkaline Phos 38 - 126 U/L  44  57   AST 15 - 41 U/L  21  24   ALT 0 - 44 U/L  16  20    RADIOGRAPHIC STUDIES: No results found.  ASSESSMENT & PLAN Geraldy Derossett 71 y.o. female with medical history significant for essential thrombocytosis who presents for a follow up visit.   After review of the labs, review of the records, and discussion with the patient the patients findings are most consistent with thrombocytosis, reported due to essential thrombocythemia. We have received the records showing that she has a JAK2 mutation.  Additionally she will require a bone marrow biopsy in order to assure that essential thrombocythemia is the diagnosis.  In the interim we will have the patient continue her hydroxyurea as previously prescribed by her provider in [place].  # Essential thrombocythemia -- At this time we will need to confirm that the patient has essential thrombocythemia.  She will require a bone marrow biopsy to confirm this diagnosis --JAK2 testing received from patient's prior hematologist. Confirmed JAK2 mutation. Plan:   -- If and when the patient is agreeable we will schedule her for a bone marrow biopsy. --Hydroxyurea was previously held due to GI bleed and anemia.  Anemia is proving with IV iron therapy and therefore we can cotinue hydroxyurea at this time. --continue 500 mg hydroxyurea daily.( Previously on 1500 mg hydroxyurea alternating with 1000 mg every other day) -- Labs today show white blood cell count 4.4, hemoglobin 9.8, MCV 105.1, and platelets of 190 --continue with labs weekly followed by a clinic visit in Jan 2024.    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 --Patient has history of GI bleeding. Patient notes no active maroon/red/black stools.  Appreciate assistance of GI in the management of this case. --Labs today show white blood cell count 4.4, hemoglobin 9.8, MCV 105.1, and platelets of 190. Iron panel pending. --continue to follow with weekly labs   No orders of the defined types were placed in this encounter.   All questions were answered. The patient knows to call the clinic with any problems, questions or concerns.  A total of more than 30 minutes were spent on this encounter with face-to-face time and non-face-to-face time, including preparing to see the patient, ordering tests and/or medications, counseling the patient and coordination of care as outlined above.   John T. Dorsey, MD Department of Hematology/Oncology [facility] Phone: [phone] Pager: [phone] Email: [email or web address]  08/01/2022 8:29 AM 

## 2022-08-07 ENCOUNTER — Telehealth: Payer: Self-pay | Admitting: *Deleted

## 2022-08-07 ENCOUNTER — Inpatient Hospital Stay: Payer: No Typology Code available for payment source

## 2022-08-07 DIAGNOSIS — D473 Essential (hemorrhagic) thrombocythemia: Secondary | ICD-10-CM | POA: Diagnosis not present

## 2022-08-07 DIAGNOSIS — D500 Iron deficiency anemia secondary to blood loss (chronic): Secondary | ICD-10-CM

## 2022-08-07 LAB — CBC WITH DIFFERENTIAL (CANCER CENTER ONLY)
Abs Immature Granulocytes: 0.02 10*3/uL (ref 0.00–0.07)
Basophils Absolute: 0 10*3/uL (ref 0.0–0.1)
Basophils Relative: 1 %
Eosinophils Absolute: 0 10*3/uL (ref 0.0–0.5)
Eosinophils Relative: 0 %
HCT: 37 % (ref 36.0–46.0)
Hemoglobin: 11.6 g/dL — ABNORMAL LOW (ref 12.0–15.0)
Immature Granulocytes: 0 %
Lymphocytes Relative: 12 %
Lymphs Abs: 0.8 10*3/uL (ref 0.7–4.0)
MCH: 34.4 pg — ABNORMAL HIGH (ref 26.0–34.0)
MCHC: 31.4 g/dL (ref 30.0–36.0)
MCV: 109.8 fL — ABNORMAL HIGH (ref 80.0–100.0)
Monocytes Absolute: 0.6 10*3/uL (ref 0.1–1.0)
Monocytes Relative: 9 %
Neutro Abs: 5 10*3/uL (ref 1.7–7.7)
Neutrophils Relative %: 78 %
Platelet Count: 266 10*3/uL (ref 150–400)
RBC: 3.37 MIL/uL — ABNORMAL LOW (ref 3.87–5.11)
RDW: 16.5 % — ABNORMAL HIGH (ref 11.5–15.5)
WBC Count: 6.3 10*3/uL (ref 4.0–10.5)
nRBC: 0 % (ref 0.0–0.2)

## 2022-08-07 LAB — IRON AND IRON BINDING CAPACITY (CC-WL,HP ONLY)
Iron: 79 ug/dL (ref 28–170)
Saturation Ratios: 24 % (ref 10.4–31.8)
TIBC: 326 ug/dL (ref 250–450)
UIBC: 247 ug/dL (ref 148–442)

## 2022-08-07 LAB — FERRITIN: Ferritin: 242 ng/mL (ref 11–307)

## 2022-08-07 NOTE — Telephone Encounter (Signed)
TCT patient after receiving a vm from. Spoke with her. She states she was concerned because she had a few days of being quite fatigued. She received IV iron on 12//1/23. She had labs done this morning HGB is up to 11.6. Kristina Osborne states she feels better today, overall. Denies any overt bleeding. She states she had stopped her Hydrea for a few days and then resumed every other day. Normally she takes it daily. Advised to resume daily. Platelets are @ 266. Pt voiced understanding and is aware of her future appts.  Encouraged to call with any concerns.

## 2022-08-10 ENCOUNTER — Telehealth: Payer: Self-pay

## 2022-08-10 NOTE — Telephone Encounter (Signed)
Benedict to inquire the status of the referral that was placed on 07/05/22. Nia states the office has reached out to patient once and left a voicemail to call them back. Nia also states they will call the patient a total of 3 times.

## 2022-08-14 ENCOUNTER — Other Ambulatory Visit: Payer: Self-pay

## 2022-08-14 ENCOUNTER — Inpatient Hospital Stay: Payer: No Typology Code available for payment source

## 2022-08-14 DIAGNOSIS — D473 Essential (hemorrhagic) thrombocythemia: Secondary | ICD-10-CM | POA: Diagnosis not present

## 2022-08-14 DIAGNOSIS — D500 Iron deficiency anemia secondary to blood loss (chronic): Secondary | ICD-10-CM

## 2022-08-14 LAB — CBC WITH DIFFERENTIAL (CANCER CENTER ONLY)
Abs Immature Granulocytes: 0.01 10*3/uL (ref 0.00–0.07)
Basophils Absolute: 0 10*3/uL (ref 0.0–0.1)
Basophils Relative: 0 %
Eosinophils Absolute: 0 10*3/uL (ref 0.0–0.5)
Eosinophils Relative: 0 %
HCT: 36.6 % (ref 36.0–46.0)
Hemoglobin: 12.1 g/dL (ref 12.0–15.0)
Immature Granulocytes: 0 %
Lymphocytes Relative: 11 %
Lymphs Abs: 0.7 10*3/uL (ref 0.7–4.0)
MCH: 36 pg — ABNORMAL HIGH (ref 26.0–34.0)
MCHC: 33.1 g/dL (ref 30.0–36.0)
MCV: 108.9 fL — ABNORMAL HIGH (ref 80.0–100.0)
Monocytes Absolute: 0.5 10*3/uL (ref 0.1–1.0)
Monocytes Relative: 7 %
Neutro Abs: 5.4 10*3/uL (ref 1.7–7.7)
Neutrophils Relative %: 82 %
Platelet Count: 649 10*3/uL — ABNORMAL HIGH (ref 150–400)
RBC: 3.36 MIL/uL — ABNORMAL LOW (ref 3.87–5.11)
RDW: 16.6 % — ABNORMAL HIGH (ref 11.5–15.5)
WBC Count: 6.7 10*3/uL (ref 4.0–10.5)
nRBC: 0 % (ref 0.0–0.2)

## 2022-08-14 LAB — FERRITIN: Ferritin: 157 ng/mL (ref 11–307)

## 2022-08-14 LAB — IRON AND IRON BINDING CAPACITY (CC-WL,HP ONLY)
Iron: 64 ug/dL (ref 28–170)
Saturation Ratios: 20 % (ref 10.4–31.8)
TIBC: 329 ug/dL (ref 250–450)
UIBC: 265 ug/dL (ref 148–442)

## 2022-08-22 ENCOUNTER — Other Ambulatory Visit: Payer: Self-pay

## 2022-08-22 ENCOUNTER — Inpatient Hospital Stay: Payer: No Typology Code available for payment source

## 2022-08-22 DIAGNOSIS — D500 Iron deficiency anemia secondary to blood loss (chronic): Secondary | ICD-10-CM

## 2022-08-22 DIAGNOSIS — D473 Essential (hemorrhagic) thrombocythemia: Secondary | ICD-10-CM | POA: Diagnosis not present

## 2022-08-22 LAB — CBC WITH DIFFERENTIAL (CANCER CENTER ONLY)
Abs Immature Granulocytes: 0.01 10*3/uL (ref 0.00–0.07)
Basophils Absolute: 0.1 10*3/uL (ref 0.0–0.1)
Basophils Relative: 1 %
Eosinophils Absolute: 0 10*3/uL (ref 0.0–0.5)
Eosinophils Relative: 1 %
HCT: 33.5 % — ABNORMAL LOW (ref 36.0–46.0)
Hemoglobin: 11.1 g/dL — ABNORMAL LOW (ref 12.0–15.0)
Immature Granulocytes: 0 %
Lymphocytes Relative: 11 %
Lymphs Abs: 0.7 10*3/uL (ref 0.7–4.0)
MCH: 35.9 pg — ABNORMAL HIGH (ref 26.0–34.0)
MCHC: 33.1 g/dL (ref 30.0–36.0)
MCV: 108.4 fL — ABNORMAL HIGH (ref 80.0–100.0)
Monocytes Absolute: 0.4 10*3/uL (ref 0.1–1.0)
Monocytes Relative: 7 %
Neutro Abs: 5 10*3/uL (ref 1.7–7.7)
Neutrophils Relative %: 80 %
Platelet Count: 606 10*3/uL — ABNORMAL HIGH (ref 150–400)
RBC: 3.09 MIL/uL — ABNORMAL LOW (ref 3.87–5.11)
RDW: 16 % — ABNORMAL HIGH (ref 11.5–15.5)
WBC Count: 6.3 10*3/uL (ref 4.0–10.5)
nRBC: 0 % (ref 0.0–0.2)

## 2022-08-22 LAB — IRON AND IRON BINDING CAPACITY (CC-WL,HP ONLY)
Iron: 47 ug/dL (ref 28–170)
Saturation Ratios: 16 % (ref 10.4–31.8)
TIBC: 295 ug/dL (ref 250–450)
UIBC: 248 ug/dL (ref 148–442)

## 2022-08-22 LAB — FERRITIN: Ferritin: 107 ng/mL (ref 11–307)

## 2022-08-30 ENCOUNTER — Other Ambulatory Visit: Payer: Self-pay

## 2022-08-30 ENCOUNTER — Inpatient Hospital Stay: Payer: No Typology Code available for payment source | Attending: Hematology and Oncology

## 2022-08-30 DIAGNOSIS — Z87891 Personal history of nicotine dependence: Secondary | ICD-10-CM | POA: Diagnosis not present

## 2022-08-30 DIAGNOSIS — K5520 Angiodysplasia of colon without hemorrhage: Secondary | ICD-10-CM | POA: Insufficient documentation

## 2022-08-30 DIAGNOSIS — D500 Iron deficiency anemia secondary to blood loss (chronic): Secondary | ICD-10-CM | POA: Diagnosis not present

## 2022-08-30 DIAGNOSIS — Z79899 Other long term (current) drug therapy: Secondary | ICD-10-CM | POA: Insufficient documentation

## 2022-08-30 DIAGNOSIS — D473 Essential (hemorrhagic) thrombocythemia: Secondary | ICD-10-CM | POA: Insufficient documentation

## 2022-08-30 LAB — CBC WITH DIFFERENTIAL (CANCER CENTER ONLY)
Abs Immature Granulocytes: 0.02 10*3/uL (ref 0.00–0.07)
Basophils Absolute: 0 10*3/uL (ref 0.0–0.1)
Basophils Relative: 0 %
Eosinophils Absolute: 0 10*3/uL (ref 0.0–0.5)
Eosinophils Relative: 0 %
HCT: 36.4 % (ref 36.0–46.0)
Hemoglobin: 11.9 g/dL — ABNORMAL LOW (ref 12.0–15.0)
Immature Granulocytes: 0 %
Lymphocytes Relative: 10 %
Lymphs Abs: 0.7 10*3/uL (ref 0.7–4.0)
MCH: 35.4 pg — ABNORMAL HIGH (ref 26.0–34.0)
MCHC: 32.7 g/dL (ref 30.0–36.0)
MCV: 108.3 fL — ABNORMAL HIGH (ref 80.0–100.0)
Monocytes Absolute: 0.5 10*3/uL (ref 0.1–1.0)
Monocytes Relative: 7 %
Neutro Abs: 5.6 10*3/uL (ref 1.7–7.7)
Neutrophils Relative %: 83 %
Platelet Count: 540 10*3/uL — ABNORMAL HIGH (ref 150–400)
RBC: 3.36 MIL/uL — ABNORMAL LOW (ref 3.87–5.11)
RDW: 15.3 % (ref 11.5–15.5)
WBC Count: 6.9 10*3/uL (ref 4.0–10.5)
nRBC: 0 % (ref 0.0–0.2)

## 2022-08-30 LAB — IRON AND IRON BINDING CAPACITY (CC-WL,HP ONLY)
Iron: 58 ug/dL (ref 28–170)
Saturation Ratios: 19 % (ref 10.4–31.8)
TIBC: 308 ug/dL (ref 250–450)
UIBC: 250 ug/dL (ref 148–442)

## 2022-08-30 LAB — FERRITIN: Ferritin: 55 ng/mL (ref 11–307)

## 2022-09-06 ENCOUNTER — Inpatient Hospital Stay (HOSPITAL_BASED_OUTPATIENT_CLINIC_OR_DEPARTMENT_OTHER): Payer: No Typology Code available for payment source | Admitting: Hematology and Oncology

## 2022-09-06 ENCOUNTER — Other Ambulatory Visit: Payer: Self-pay

## 2022-09-06 ENCOUNTER — Inpatient Hospital Stay: Payer: No Typology Code available for payment source

## 2022-09-06 VITALS — BP 119/59 | HR 75 | Temp 98.1°F | Resp 16 | Ht 66.0 in

## 2022-09-06 DIAGNOSIS — D500 Iron deficiency anemia secondary to blood loss (chronic): Secondary | ICD-10-CM | POA: Diagnosis not present

## 2022-09-06 DIAGNOSIS — D75839 Thrombocytosis, unspecified: Secondary | ICD-10-CM | POA: Diagnosis not present

## 2022-09-06 DIAGNOSIS — D473 Essential (hemorrhagic) thrombocythemia: Secondary | ICD-10-CM | POA: Diagnosis not present

## 2022-09-06 LAB — CBC WITH DIFFERENTIAL (CANCER CENTER ONLY)
Abs Immature Granulocytes: 0.01 10*3/uL (ref 0.00–0.07)
Basophils Absolute: 0 10*3/uL (ref 0.0–0.1)
Basophils Relative: 1 %
Eosinophils Absolute: 0 10*3/uL (ref 0.0–0.5)
Eosinophils Relative: 1 %
HCT: 34.8 % — ABNORMAL LOW (ref 36.0–46.0)
Hemoglobin: 11.6 g/dL — ABNORMAL LOW (ref 12.0–15.0)
Immature Granulocytes: 0 %
Lymphocytes Relative: 14 %
Lymphs Abs: 0.7 10*3/uL (ref 0.7–4.0)
MCH: 35.6 pg — ABNORMAL HIGH (ref 26.0–34.0)
MCHC: 33.3 g/dL (ref 30.0–36.0)
MCV: 106.7 fL — ABNORMAL HIGH (ref 80.0–100.0)
Monocytes Absolute: 0.4 10*3/uL (ref 0.1–1.0)
Monocytes Relative: 7 %
Neutro Abs: 4.2 10*3/uL (ref 1.7–7.7)
Neutrophils Relative %: 77 %
Platelet Count: 692 10*3/uL — ABNORMAL HIGH (ref 150–400)
RBC: 3.26 MIL/uL — ABNORMAL LOW (ref 3.87–5.11)
RDW: 14.9 % (ref 11.5–15.5)
WBC Count: 5.4 10*3/uL (ref 4.0–10.5)
nRBC: 0 % (ref 0.0–0.2)

## 2022-09-06 LAB — FERRITIN: Ferritin: 60 ng/mL (ref 11–307)

## 2022-09-06 LAB — IRON AND IRON BINDING CAPACITY (CC-WL,HP ONLY)
Iron: 35 ug/dL (ref 28–170)
Saturation Ratios: 11 % (ref 10.4–31.8)
TIBC: 329 ug/dL (ref 250–450)
UIBC: 294 ug/dL (ref 148–442)

## 2022-09-06 NOTE — Progress Notes (Signed)
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 Lorenso Courier  10/26/2021: WBC 6.7, Hgb 13.3, MCV 121.7, Plt 204 03/10/2022: WBC 3.7, Hgb 11.2, MCV 120, Plt 261. Decrease hydroxyurea to 1000 mg BID due to cytopenias.  06/24/2022-06/27/2022: Admitted with worsening GI bleed. Hydroxyurea held.  08/01/2022: WBC 4.4, hemoglobin 9.8, MCV 105.1, and platelets of 190  Interval History:  Kristina Osborne 72 y.o. female with medical history significant for essential thrombocytosis who presents for a follow up visit. The patient's last visit was on 07/31/2022. In the interim since the last visit [name] has continued taking hydroxyurea 500 mg PO daily.   On exam today [name] is accompanied by her husband.  She reports she had a good holiday season.  Her niece moved down from [place] and they were able to spend time with her.  Patient is delighted to report that she has not had any bleeding since December 5.  She reports she has an upcoming appointment with [facility] on 10/03/2022.  This is for evaluation for possible push enteroscopy.  She notes that she has had no major changes in her symptoms in the interim since her last visit.  Overall she is felt quite well.  She is currently taking her hydroxyurea daily but will increase to twice daily due to her current thrombocytosis.  She denies bleeding from any other sources such as nosebleeds, gum bleed, or dark stools.  She also denies any signs or symptoms concerning for VTE including shortness of breath, leg swelling, or chest pain.  A full 10 point  ROS is listed below.   MEDICAL HISTORY:  Past Medical History:  Diagnosis Date   ABLA (acute blood loss anemia) 04/16/2022   Congenital malformation of intestinal fixation    Gastritis    Gastroesophageal reflux disease without esophagitis 09/10/2021   IDA (iron deficiency anemia)    Mitral valve prolapse    Thrombocytosis     SURGICAL HISTORY: Past Surgical History:  Procedure Laterality Date   BIOPSY  06/26/2022   Procedure: BIOPSY;  Surgeon: Daryel November, MD;  Location: [facility];  Service: Gastroenterology;;   COLONOSCOPY N/A 04/17/2022   Procedure: COLONOSCOPY;  Surgeon: Jerene Bears, MD;  Location: [facility];  Service: Gastroenterology;  Laterality: N/A;   COLONOSCOPY WITH PROPOFOL N/A 06/26/2022   Procedure: COLONOSCOPY WITH PROPOFOL;  Surgeon: Daryel November, MD;  Location: [facility];  Service: Gastroenterology;  Laterality: N/A;   ENTEROSCOPY N/A 04/17/2022   Procedure: ENTEROSCOPY;  Surgeon: Jerene Bears, MD;  Location: [facility];  Service: Gastroenterology;  Laterality: N/A;   ENTEROSCOPY N/A 06/26/2022   Procedure: ENTEROSCOPY;  Surgeon: Daryel November, MD;  Location: [facility];  Service: Gastroenterology;  Laterality: N/A;   GIVENS CAPSULE STUDY N/A 06/26/2022   Procedure: GIVENS CAPSULE STUDY;  Surgeon: Daryel November, MD;  Location: [facility];  Service: Gastroenterology;  Laterality: N/A;   HEMOSTASIS CLIP PLACEMENT  04/17/2022   Procedure: HEMOSTASIS CLIP PLACEMENT;  Surgeon: Jerene Bears, MD;  Location: [facility];  Service: Gastroenterology;;   HEMOSTASIS CLIP PLACEMENT  06/26/2022   Procedure: HEMOSTASIS CLIP  PLACEMENT;  Surgeon: Daryel November, MD;  Location: [facility];  Service: Gastroenterology;;   HOT HEMOSTASIS N/A 04/17/2022   Procedure: HOT HEMOSTASIS (ARGON PLASMA COAGULATION/BICAP);  Surgeon: Jerene Bears, MD;  Location: [facility];  Service: Gastroenterology;  Laterality: N/A;   HOT HEMOSTASIS N/A  06/26/2022   Procedure: HOT HEMOSTASIS (ARGON PLASMA COAGULATION/BICAP);  Surgeon: Daryel November, MD;  Location: [facility];  Service: Gastroenterology;  Laterality: N/A;   POLYPECTOMY  04/17/2022   Procedure: POLYPECTOMY;  Surgeon: Jerene Bears, MD;  Location: [facility];  Service: Gastroenterology;;   POLYPECTOMY  06/26/2022   Procedure: POLYPECTOMY;  Surgeon: Daryel November, MD;  Location: [facility];  Service: Gastroenterology;;   [place] INJECTION  06/26/2022   Procedure: SUBMUCOSAL TATTOO INJECTION;  Surgeon: Daryel November, MD;  Location: [facility];  Service: Gastroenterology;;    SOCIAL HISTORY: Social History   Socioeconomic History   Marital status: Married    Spouse name: Not on file   Number of children: 0   Years of education: Not on file   Highest education level: Not on file  Occupational History   Not on file  Tobacco Use   Smoking status: Former    Years: 15.00    Types: Cigarettes   Smokeless tobacco: Never  Vaping Use   Vaping Use: Never used  Substance and Sexual Activity   Alcohol use: Not Currently   Drug use: Never   Sexual activity: Yes    Partners: Male  Other Topics Concern   Not on file  Social History Narrative   Not on file   Social Determinants of Health   Financial Resource Strain: Not on file  Food Insecurity: No Food Insecurity (06/24/2022)   Hunger Vital Sign    Worried About Running Out of Food in the Last Year: Never true    Ran Out of Food in the Last Year: Never true  Transportation Needs: No Transportation Needs (06/24/2022)   PRAPARE - Hydrologist (Medical): No    Lack of Transportation (Non-Medical): No  Physical Activity: Not on file  Stress: Not on file  Social Connections: Not on file  Intimate Partner Violence: Not At Risk (06/24/2022)   Humiliation, Afraid, Rape, and Kick questionnaire    Fear of Current or Ex-Partner: No     Emotionally Abused: No    Physically Abused: No    Sexually Abused: No    FAMILY HISTORY: Family History  Problem Relation Age of Onset   Heart disease Mother    Valvular heart disease Mother    Healthy Sister    Alcoholism Half-Sister     ALLERGIES:  is allergic to aspirin and sulfa antibiotics.  MEDICATIONS:  Current Outpatient Medications  Medication Sig Dispense Refill   acetaminophen (TYLENOL) 500 MG tablet Take 1,000 mg by mouth every 6 (six) hours as needed for mild pain or headache.     Ascorbic Acid (VITAMIN C) 1000 MG tablet Take 500 mg by mouth in the morning and at bedtime.     chlorhexidine (PERIDEX) 0.12 % solution 5 mLs by Mouth Rinse route 2 (two) times daily.     cholecalciferol (VITAMIN D3) 25 MCG (1000 UNIT) tablet Take 1,000 Units by mouth daily.     cholestyramine light (PREVALITE) 4 g packet Take 1 packet (4 g total) by mouth 2 (two) times daily. 180 each 3   famotidine (PEPCID) 40 MG tablet Take 1  tablet (40 mg total) by mouth daily. 90 tablet 3   folic acid (FOLVITE) 1 MG tablet TAKE 1 TABLET BY MOUTH EVERY DAY (Patient taking differently: Take 1 mg by mouth daily.) 90 tablet 1   glucosamine-chondroitin 500-400 MG tablet Take 1 tablet by mouth 2 (two) times daily.     hydroxyurea (HYDREA) 500 MG capsule TAKE 1 CAPSULE BY MOUTH TWICE A DAY 180 capsule 1   metoprolol tartrate (LOPRESSOR) 25 MG tablet Take 12.5 mg by mouth 2 (two) times daily as needed (abdormal HR).     Multiple Vitamins-Minerals (MULTIVITAMIN ADULTS 50+) TABS Take 1 tablet by mouth daily.     Multiple Vitamins-Minerals (PRESERVISION AREDS 2+MULTI VIT PO) Take 1 capsule by mouth 2 (two) times daily.     sucralfate (CARAFATE) 1 g tablet Take 1 tablet (1 g total) by mouth 4 (four) times daily -  with meals and at bedtime. (Patient taking differently: Take 1 g by mouth at bedtime.) 360 tablet 3   No current facility-administered medications for this visit.    REVIEW OF SYSTEMS:    Constitutional: ( - ) fevers, ( - )  chills , ( - ) night sweats Eyes: ( - ) blurriness of vision, ( - ) double vision, ( - ) watery eyes Ears, nose, mouth, throat, and face: ( - ) mucositis, ( - ) sore throat Respiratory: ( - ) cough, ( - ) dyspnea, ( - ) wheezes Cardiovascular: ( - ) palpitation, ( - ) chest discomfort, ( - ) lower extremity swelling Gastrointestinal:  ( - ) nausea, ( - ) heartburn, ( - ) change in bowel habits Skin: ( - ) abnormal skin rashes Lymphatics: ( - ) new lymphadenopathy, ( - ) easy bruising Neurological: ( - ) numbness, ( - ) tingling, ( - ) new weaknesses Behavioral/Psych: ( - ) mood change, ( - ) new changes  All other systems were reviewed with the patient and are negative.  PHYSICAL EXAMINATION: ECOG PERFORMANCE STATUS: 0 - Asymptomatic  Vitals:   09/06/22 1117  BP: (!) 119/59  Pulse: 75  Resp: 16  Temp: 98.1 F (36.7 C)  SpO2: 97%   Filed Weights     GENERAL: Well-appearing elderly Caucasian female, alert, no distress and comfortable SKIN: skin color, texture, turgor are normal, no rashes or significant lesions EYES: conjunctiva are pink and non-injected, sclera clear LUNGS: clear to auscultation and percussion with normal breathing effort HEART: regular rate & rhythm and no murmurs and no lower extremity edema Musculoskeletal: no cyanosis of digits and no clubbing  PSYCH: alert & oriented x 3, fluent speech NEURO: no focal motor/sensory deficits  LABORATORY DATA:  I have reviewed the data as listed    Latest Ref Rng & Units 09/06/2022   10:39 AM 08/30/2022   11:29 AM 08/22/2022   11:24 AM  CBC  WBC 4.0 - 10.5 K/uL 5.4  6.9  6.3   Hemoglobin 12.0 - 15.0 g/dL 11.6  11.9  11.1   Hematocrit 36.0 - 46.0 % 34.8  36.4  33.5   Platelets 150 - 400 K/uL 692  540  606        Latest Ref Rng & Units 06/27/2022    3:52 AM 06/25/2022    3:50 AM 06/24/2022    2:21 PM  CMP  Glucose 70 - 99 mg/dL 138  105  115   BUN 8 - 23 mg/dL '5  15  15    '$ Creatinine 0.44 - 1.00 mg/dL  0.82  0.56  0.77   Sodium 135 - 145 mmol/L 139  139  139   Potassium 3.5 - 5.1 mmol/L 3.4  3.5  4.2   Chloride 98 - 111 mmol/L 111  113  106   CO2 22 - 32 mmol/L '22  23  28   '$ Calcium 8.9 - 10.3 mg/dL 8.9  8.6  9.4   Total Protein 6.5 - 8.1 g/dL  5.3  6.5   Total Bilirubin 0.3 - 1.2 mg/dL  0.4  0.6   Alkaline Phos 38 - 126 U/L  44  57   AST 15 - 41 U/L  21  24   ALT 0 - 44 U/L  16  20    RADIOGRAPHIC STUDIES: No results found.  ASSESSMENT & PLAN Leveda Kendrix 72 y.o. female with medical history significant for essential thrombocytosis who presents for a follow up visit.   After review of the labs, review of the records, and discussion with the patient the patients findings are most consistent with thrombocytosis, reported due to essential thrombocythemia. We have received the records showing that she has a JAK2 mutation.  Additionally she will require a bone marrow biopsy in order to assure that essential thrombocythemia is the diagnosis.  In the interim we will have the patient continue her hydroxyurea as previously prescribed by her provider in [place].  # Essential thrombocythemia -- At this time we will need to confirm that the patient has essential thrombocythemia.  She will require a bone marrow biopsy to confirm this diagnosis --JAK2 testing received from patient's prior hematologist. Confirmed JAK2 mutation. Plan:   -- If and when the patient is agreeable we will schedule her for a bone marrow biopsy. --Hydroxyurea was previously held due to GI bleed and anemia.  Anemia is proving with IV iron therapy and therefore we can cotinue hydroxyurea at this time. --increase to 500 mg hydroxyurea BID.( Previously on 1500 mg hydroxyurea alternating with 1000 mg every other day) -- Labs today show white blood cell count 5.4, hemoglobin 11.6, MCV 106.7, and platelets of 692 --continue with labs q 2 weekly followed by a clinic visit in 6 weeks.  # Iron  Deficiency Anemia 2/2 go AVM of GI Tract -- Patient failed therapy with IV iron sucrose, recieved Monoferric 1000 mg IV x 1 dose on 07/28/2022. --Patient has history of GI bleeding. Patient notes no active maroon/red/black stools.  Appreciate assistance of GI in the management of this case. --Labs today as noted above.  --continue to follow with weekly labs   No orders of the defined types were placed in this encounter.   All questions were answered. The patient knows to call the clinic with any problems, questions or concerns.  A total of more than 30 minutes were spent on this encounter with face-to-face time and non-face-to-face time, including preparing to see the patient, ordering tests and/or medications, counseling the patient and coordination of care as outlined above.   Ledell Peoples, MD Department of Hematology/Oncology [place] [facility] Phone: [phone] Pager: [phone] Email: [email or web address]  09/06/2022 5:02 PM

## 2022-09-19 IMAGING — DX DG CHEST 2V
2 series · 2 of 2 positions shown · non-contrast
Comparison: None

CLINICAL DATA: Palpitations for 3 days

EXAM:
CHEST - 2 VIEW

[chest pa]
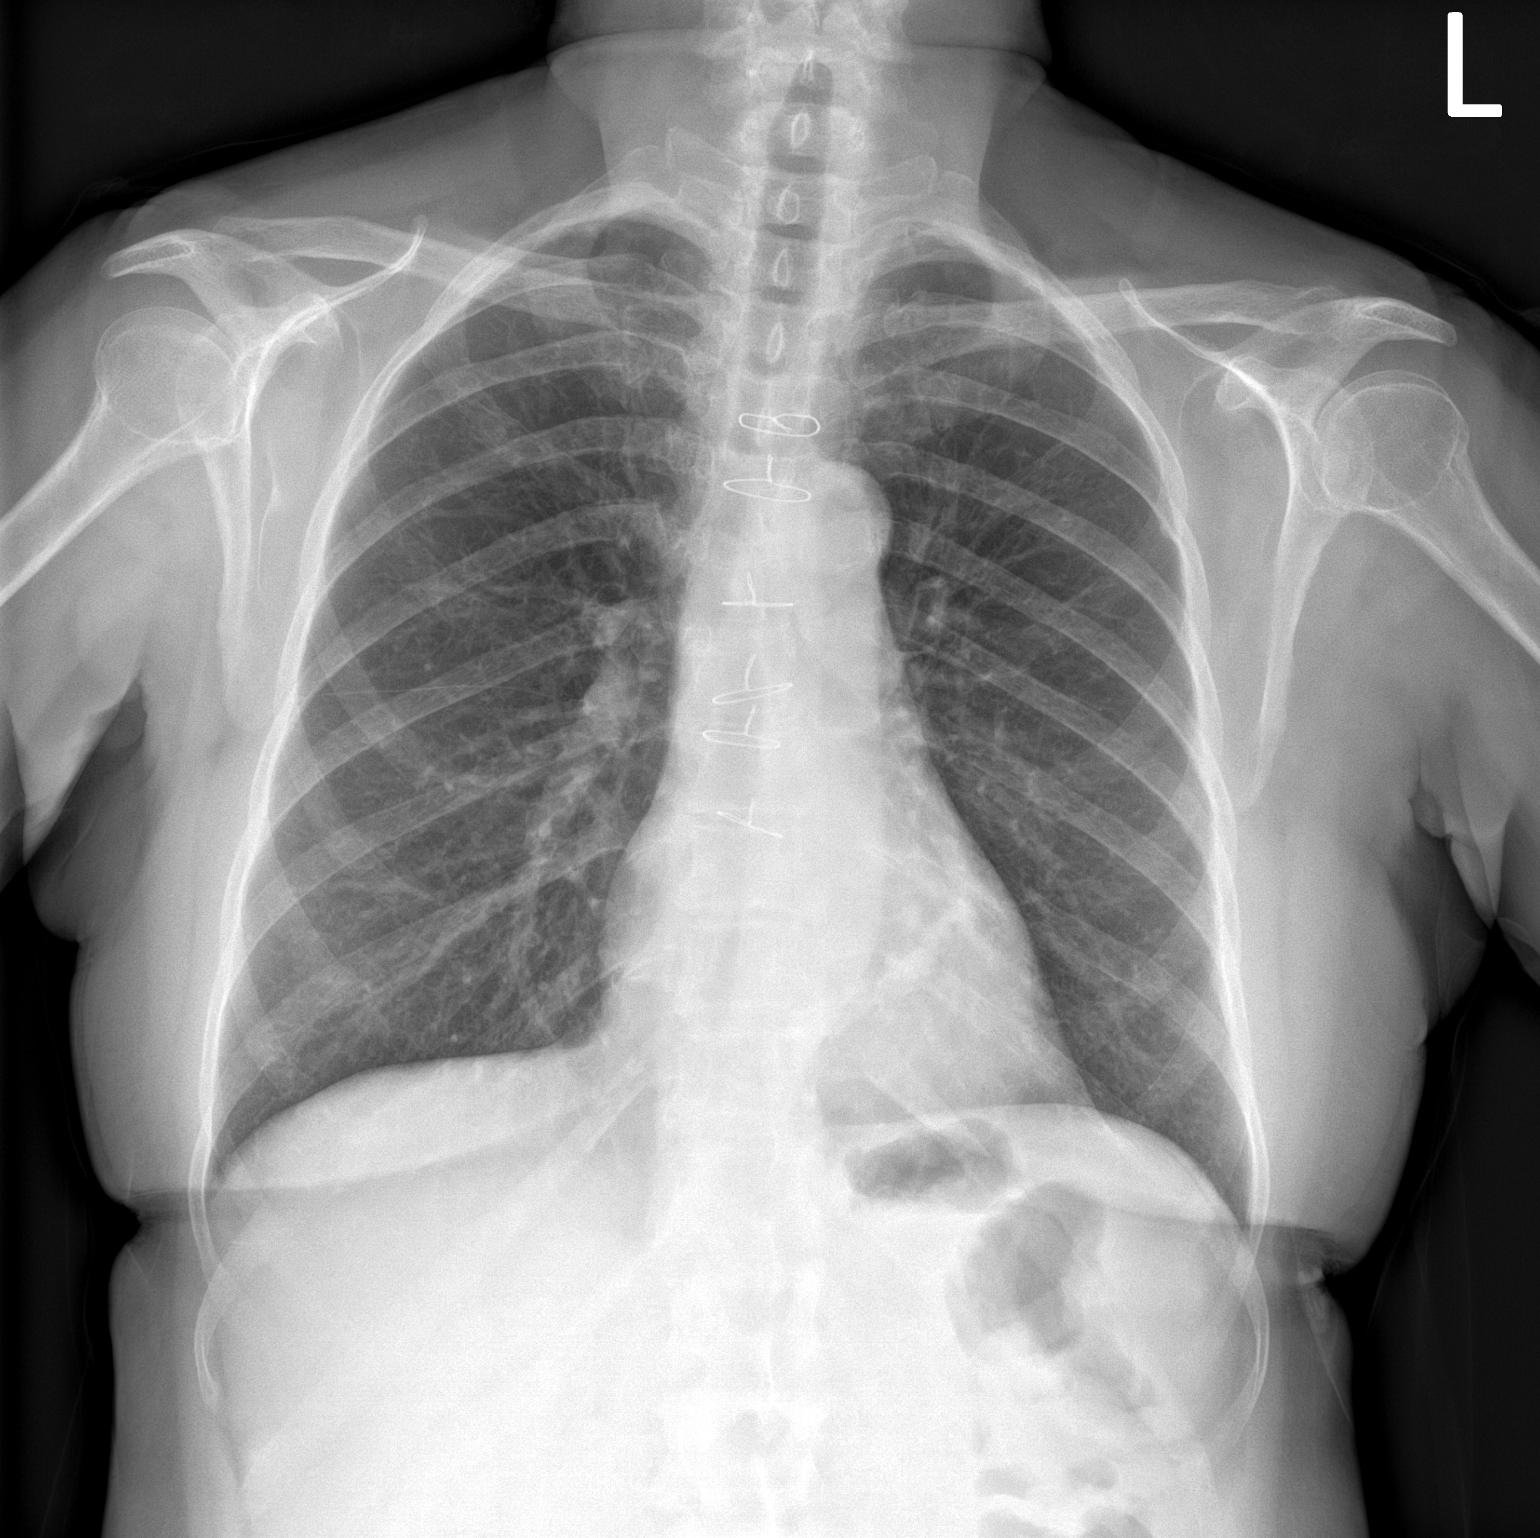

[chest lat]
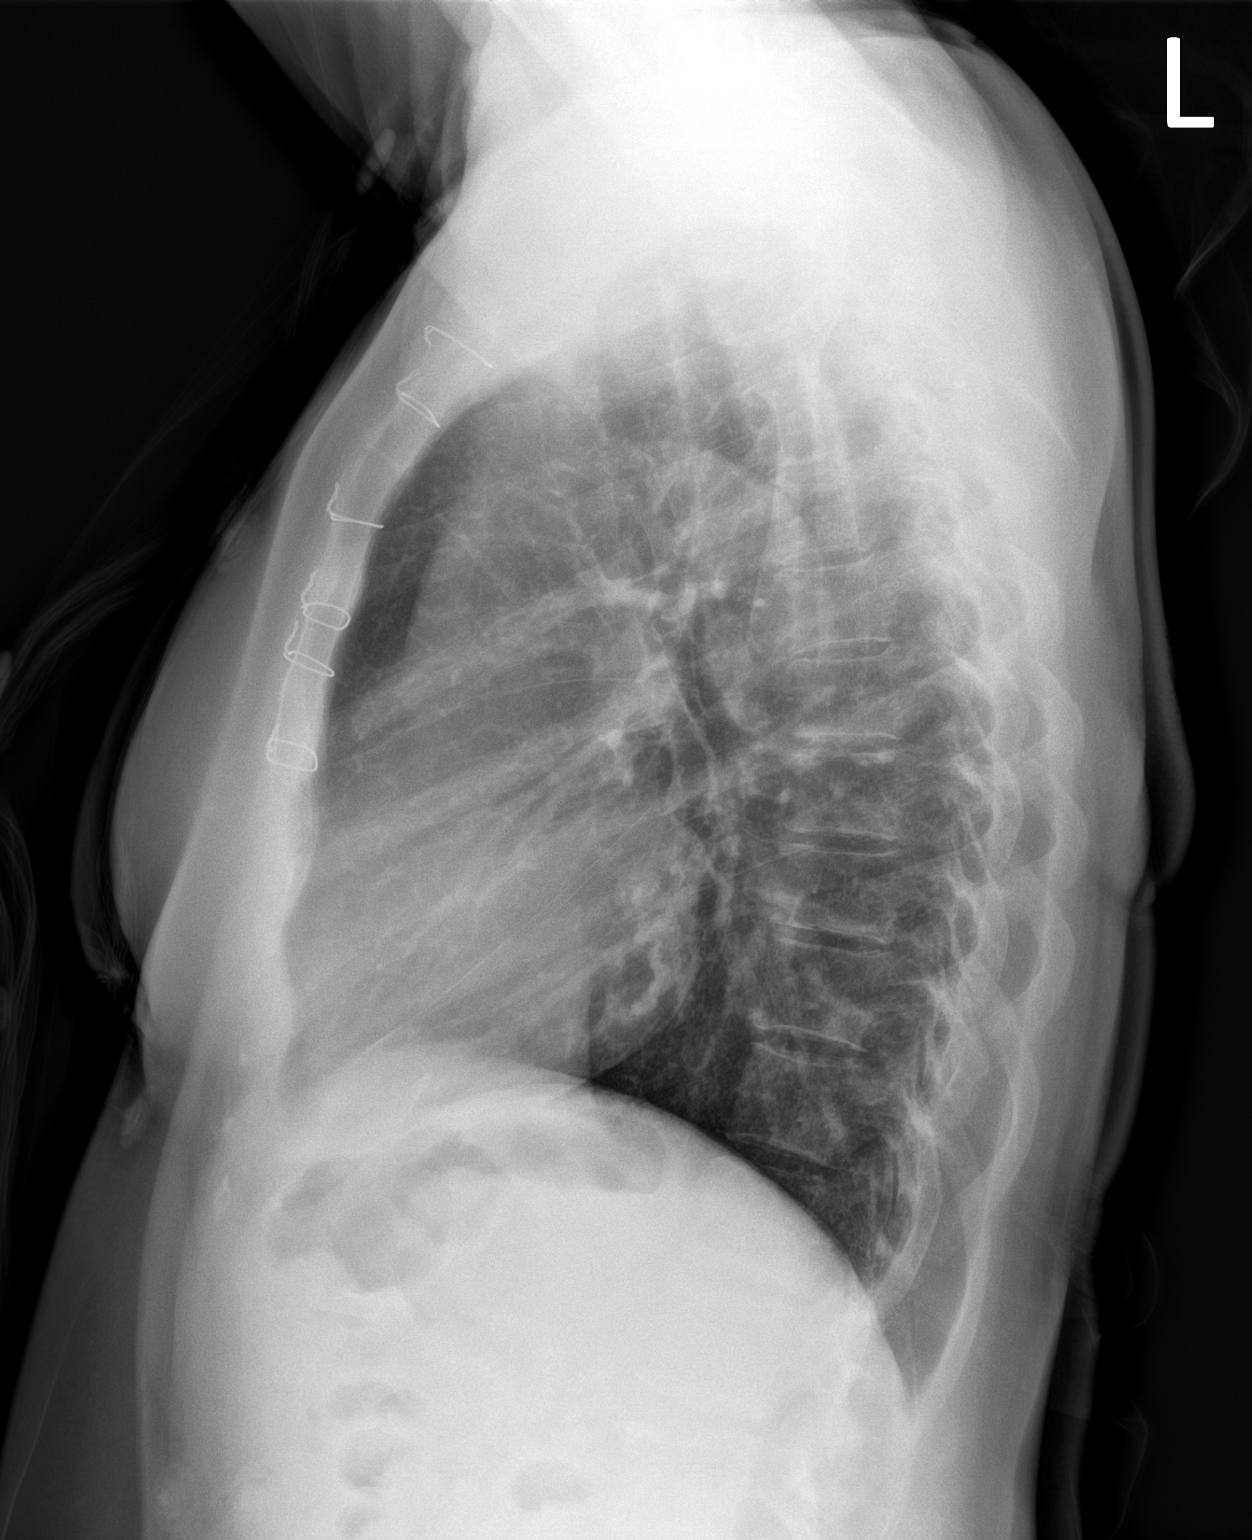

[2 of 2 positions shown; findings below may reference images not displayed]

FINDINGS: Normal heart size, mediastinal contours, and pulmonary vascularity.

Post CABG.

Atherosclerotic calcification aorta.

Minimal subsegmental atelectasis at RIGHT base.

Lungs otherwise clear.

No pulmonary infiltrate, pleural effusion, or pneumothorax.

Bones demineralized.
IMPRESSION: Minimal RIGHT basilar atelectasis.

Aortic Atherosclerosis ([id number]).

## 2022-09-20 ENCOUNTER — Other Ambulatory Visit: Payer: Self-pay

## 2022-09-20 ENCOUNTER — Inpatient Hospital Stay: Payer: No Typology Code available for payment source

## 2022-09-20 DIAGNOSIS — D473 Essential (hemorrhagic) thrombocythemia: Secondary | ICD-10-CM | POA: Diagnosis not present

## 2022-09-20 DIAGNOSIS — D500 Iron deficiency anemia secondary to blood loss (chronic): Secondary | ICD-10-CM

## 2022-09-20 LAB — CBC WITH DIFFERENTIAL (CANCER CENTER ONLY)
Abs Immature Granulocytes: 0.02 10*3/uL (ref 0.00–0.07)
Basophils Absolute: 0.1 10*3/uL (ref 0.0–0.1)
Basophils Relative: 1 %
Eosinophils Absolute: 0 10*3/uL (ref 0.0–0.5)
Eosinophils Relative: 0 %
HCT: 33.5 % — ABNORMAL LOW (ref 36.0–46.0)
Hemoglobin: 11.2 g/dL — ABNORMAL LOW (ref 12.0–15.0)
Immature Granulocytes: 0 %
Lymphocytes Relative: 11 %
Lymphs Abs: 0.7 10*3/uL (ref 0.7–4.0)
MCH: 36.1 pg — ABNORMAL HIGH (ref 26.0–34.0)
MCHC: 33.4 g/dL (ref 30.0–36.0)
MCV: 108.1 fL — ABNORMAL HIGH (ref 80.0–100.0)
Monocytes Absolute: 0.4 10*3/uL (ref 0.1–1.0)
Monocytes Relative: 6 %
Neutro Abs: 5.1 10*3/uL (ref 1.7–7.7)
Neutrophils Relative %: 82 %
Platelet Count: 504 10*3/uL — ABNORMAL HIGH (ref 150–400)
RBC: 3.1 MIL/uL — ABNORMAL LOW (ref 3.87–5.11)
RDW: 14.7 % (ref 11.5–15.5)
WBC Count: 6.2 10*3/uL (ref 4.0–10.5)
nRBC: 0 % (ref 0.0–0.2)

## 2022-09-20 LAB — IRON AND IRON BINDING CAPACITY (CC-WL,HP ONLY)
Iron: 23 ug/dL — ABNORMAL LOW (ref 28–170)
Saturation Ratios: 7 % — ABNORMAL LOW (ref 10.4–31.8)
TIBC: 340 ug/dL (ref 250–450)
UIBC: 317 ug/dL (ref 148–442)

## 2022-09-20 LAB — FERRITIN: Ferritin: 19 ng/mL (ref 11–307)

## 2022-09-28 ENCOUNTER — Other Ambulatory Visit: Payer: Self-pay | Admitting: *Deleted

## 2022-09-28 DIAGNOSIS — D500 Iron deficiency anemia secondary to blood loss (chronic): Secondary | ICD-10-CM

## 2022-09-28 NOTE — Progress Notes (Signed)
Patient called to report that she had some intestinal bleeding and feels like it may have resolved however, she is symptomatic.  She wanted to move up her lab appt.    Rescheduled to tomorrow and appropriate labs entered.  Patient aware.

## 2022-09-29 ENCOUNTER — Other Ambulatory Visit: Payer: Self-pay

## 2022-09-29 ENCOUNTER — Inpatient Hospital Stay: Payer: No Typology Code available for payment source | Attending: Hematology and Oncology

## 2022-09-29 DIAGNOSIS — Z79899 Other long term (current) drug therapy: Secondary | ICD-10-CM | POA: Insufficient documentation

## 2022-09-29 DIAGNOSIS — D473 Essential (hemorrhagic) thrombocythemia: Secondary | ICD-10-CM | POA: Diagnosis present

## 2022-09-29 DIAGNOSIS — K5521 Angiodysplasia of colon with hemorrhage: Secondary | ICD-10-CM | POA: Diagnosis not present

## 2022-09-29 DIAGNOSIS — D500 Iron deficiency anemia secondary to blood loss (chronic): Secondary | ICD-10-CM | POA: Diagnosis not present

## 2022-09-29 LAB — CBC WITH DIFFERENTIAL (CANCER CENTER ONLY)
Abs Immature Granulocytes: 0.01 10*3/uL (ref 0.00–0.07)
Basophils Absolute: 0 10*3/uL (ref 0.0–0.1)
Basophils Relative: 1 %
Eosinophils Absolute: 0.2 10*3/uL (ref 0.0–0.5)
Eosinophils Relative: 3 %
HCT: 33.5 % — ABNORMAL LOW (ref 36.0–46.0)
Hemoglobin: 10.8 g/dL — ABNORMAL LOW (ref 12.0–15.0)
Immature Granulocytes: 0 %
Lymphocytes Relative: 12 %
Lymphs Abs: 0.6 10*3/uL — ABNORMAL LOW (ref 0.7–4.0)
MCH: 34.1 pg — ABNORMAL HIGH (ref 26.0–34.0)
MCHC: 32.2 g/dL (ref 30.0–36.0)
MCV: 105.7 fL — ABNORMAL HIGH (ref 80.0–100.0)
Monocytes Absolute: 0.3 10*3/uL (ref 0.1–1.0)
Monocytes Relative: 6 %
Neutro Abs: 4 10*3/uL (ref 1.7–7.7)
Neutrophils Relative %: 78 %
Platelet Count: 334 10*3/uL (ref 150–400)
RBC: 3.17 MIL/uL — ABNORMAL LOW (ref 3.87–5.11)
RDW: 13.6 % (ref 11.5–15.5)
WBC Count: 5.2 10*3/uL (ref 4.0–10.5)
nRBC: 0 % (ref 0.0–0.2)

## 2022-09-29 LAB — IRON AND IRON BINDING CAPACITY (CC-WL,HP ONLY)
Iron: 17 ug/dL — ABNORMAL LOW (ref 28–170)
Saturation Ratios: 5 % — ABNORMAL LOW (ref 10.4–31.8)
TIBC: 363 ug/dL (ref 250–450)
UIBC: 346 ug/dL (ref 148–442)

## 2022-09-29 LAB — FERRITIN: Ferritin: 14 ng/mL (ref 11–307)

## 2022-09-29 LAB — SAMPLE TO BLOOD BANK

## 2022-10-02 ENCOUNTER — Other Ambulatory Visit: Payer: Self-pay | Admitting: Physician Assistant

## 2022-10-04 ENCOUNTER — Inpatient Hospital Stay: Payer: No Typology Code available for payment source

## 2022-10-05 ENCOUNTER — Telehealth: Payer: Self-pay | Admitting: Pharmacy Technician

## 2022-10-05 ENCOUNTER — Ambulatory Visit: Payer: No Typology Code available for payment source

## 2022-10-05 ENCOUNTER — Telehealth: Payer: Self-pay

## 2022-10-05 VITALS — BP 100/67 | HR 85 | Temp 98.2°F | Resp 16 | Ht 66.0 in | Wt 170.0 lb

## 2022-10-05 DIAGNOSIS — D500 Iron deficiency anemia secondary to blood loss (chronic): Secondary | ICD-10-CM

## 2022-10-05 MED ORDER — SODIUM CHLORIDE 0.9 % IV SOLN
1000.0000 mg | Freq: Once | INTRAVENOUS | Status: AC
  Administered 2022-10-05: 1000 mg via INTRAVENOUS
  Filled 2022-10-05: qty 10

## 2022-10-05 MED ORDER — ACETAMINOPHEN 325 MG PO TABS: 650.0000 mg | ORAL_TABLET | Freq: Once | ORAL | Status: DC

## 2022-10-05 NOTE — Telephone Encounter (Signed)
Patient called to confirm that iron infusion has been ordered. Order placed 10/03/22. Patient states that she had 4 bloody bowel movements on 10/04/22 but bleeding hs stopped. She stated that she will go to ED if necessary. Provided number to [facility].

## 2022-10-05 NOTE — Progress Notes (Signed)
Diagnosis: Iron Deficiency Anemia  Provider:  Marshell Garfinkel MD  Procedure: Infusion  IV Type: Peripheral, IV Location: R Forearm  Monoferric (Ferric Derisomaltose), Dose: 1000 mg  Infusion Start Time: [id number]  Infusion Stop Time: [id number]  Post Infusion IV Care: Peripheral IV Discontinued  Discharge: Condition: Good, Destination: Home . AVS Declined  Performed by:  Koren Shiver, RN

## 2022-10-05 NOTE — Telephone Encounter (Signed)
Dr. Charlies Silvers, Juluis Rainier note:  Auth Submission: APPROVED Payer: [employer] Medication & CPT/J Code(s) submitted: Monoferric (Ferrci derisomaltose) [phone] Route of submission (phone, fax, portal):  Phone # Fax # Auth type: Buy/Bill Units/visits requested: 1 Reference number: [id number] Approval from: 10/03/22 to 01/02/23   Patient will be scheduled as soon as possible.

## 2022-10-12 ENCOUNTER — Other Ambulatory Visit: Payer: Self-pay | Admitting: Internal Medicine

## 2022-10-17 ENCOUNTER — Other Ambulatory Visit: Payer: Self-pay | Admitting: Pharmacy Technician

## 2022-10-18 ENCOUNTER — Inpatient Hospital Stay: Payer: No Typology Code available for payment source | Admitting: Hematology and Oncology

## 2022-10-18 ENCOUNTER — Inpatient Hospital Stay: Payer: No Typology Code available for payment source

## 2022-10-18 VITALS — BP 115/65 | HR 68 | Temp 98.2°F | Resp 13

## 2022-10-18 DIAGNOSIS — D75839 Thrombocytosis, unspecified: Secondary | ICD-10-CM

## 2022-10-18 DIAGNOSIS — D473 Essential (hemorrhagic) thrombocythemia: Secondary | ICD-10-CM | POA: Diagnosis not present

## 2022-10-18 DIAGNOSIS — D500 Iron deficiency anemia secondary to blood loss (chronic): Secondary | ICD-10-CM | POA: Diagnosis not present

## 2022-10-18 LAB — CBC WITH DIFFERENTIAL (CANCER CENTER ONLY)
Abs Immature Granulocytes: 0.03 10*3/uL (ref 0.00–0.07)
Basophils Absolute: 0 10*3/uL (ref 0.0–0.1)
Basophils Relative: 1 %
Eosinophils Absolute: 0 10*3/uL (ref 0.0–0.5)
Eosinophils Relative: 1 %
HCT: 35.4 % — ABNORMAL LOW (ref 36.0–46.0)
Hemoglobin: 11.7 g/dL — ABNORMAL LOW (ref 12.0–15.0)
Immature Granulocytes: 1 %
Lymphocytes Relative: 13 %
Lymphs Abs: 0.7 10*3/uL (ref 0.7–4.0)
MCH: 36.3 pg — ABNORMAL HIGH (ref 26.0–34.0)
MCHC: 33.1 g/dL (ref 30.0–36.0)
MCV: 109.9 fL — ABNORMAL HIGH (ref 80.0–100.0)
Monocytes Absolute: 0.5 10*3/uL (ref 0.1–1.0)
Monocytes Relative: 8 %
Neutro Abs: 4.3 10*3/uL (ref 1.7–7.7)
Neutrophils Relative %: 76 %
Platelet Count: 475 10*3/uL — ABNORMAL HIGH (ref 150–400)
RBC: 3.22 MIL/uL — ABNORMAL LOW (ref 3.87–5.11)
RDW: 17 % — ABNORMAL HIGH (ref 11.5–15.5)
WBC Count: 5.6 10*3/uL (ref 4.0–10.5)
nRBC: 0 % (ref 0.0–0.2)

## 2022-10-18 LAB — IRON AND IRON BINDING CAPACITY (CC-WL,HP ONLY)
Iron: 72 ug/dL (ref 28–170)
Saturation Ratios: 21 % (ref 10.4–31.8)
TIBC: 343 ug/dL (ref 250–450)
UIBC: 271 ug/dL (ref 148–442)

## 2022-10-18 LAB — FERRITIN: Ferritin: 196 ng/mL (ref 11–307)

## 2022-10-18 NOTE — Progress Notes (Signed)
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 Lorenso Courier  10/26/2021: WBC 6.7, Hgb 13.3, MCV 121.7, Plt 204 03/10/2022: WBC 3.7, Hgb 11.2, MCV 120, Plt 261. Decrease hydroxyurea to 1000 mg BID due to cytopenias.  06/24/2022-06/27/2022: Admitted with worsening GI bleed. Hydroxyurea held.  08/01/2022: WBC 4.4, hemoglobin 9.8, MCV 105.1, and platelets of 190  Interval History:  Kristina Osborne 72 y.o. female with medical history significant for essential thrombocytosis who presents for a follow up visit. The patient's last visit was on 09/06/2022. In the interim since the last visit Kristina Osborne has continued taking hydroxyurea 500 mg PO daily.   On exam today Kristina Osborne is accompanied by her husband.  She reports she has been feeling good overall interim since her last visit.  She has had no bleeding, bruising, or dark stools since her last visit.  She notes that she did visit with [facility] who was in support of our current plan.  She reports that she continues her hydroxyurea 500 mg twice daily.  She notes that she did receive her Monoferric therapy on 10/05/2022 and tolerated it well with no major side effects.  She is not having any difficulty with lightheadedness, dizziness, or shortness of breath.  She does have some occasional postural hypotension and sensation of her "head spinning".  She notes that she has been walking her dog without difficulty and performing her other day-to-day tasks without trouble.  She also denies any signs or symptoms concerning for VTE including  shortness of breath, leg swelling, or chest pain.  A full 10 point ROS is listed below.   MEDICAL HISTORY:  Past Medical History:  Diagnosis Date   ABLA (acute blood loss anemia) 04/16/2022   Congenital malformation of intestinal fixation    Gastritis    Gastroesophageal reflux disease without esophagitis 09/10/2021   IDA (iron deficiency anemia)    Mitral valve prolapse    Thrombocytosis     SURGICAL HISTORY: Past Surgical History:  Procedure Laterality Date   BIOPSY  06/26/2022   Procedure: BIOPSY;  Surgeon: Daryel November, MD;  Location: [facility];  Service: Gastroenterology;;   COLONOSCOPY N/A 04/17/2022   Procedure: COLONOSCOPY;  Surgeon: Jerene Bears, MD;  Location: [facility];  Service: Gastroenterology;  Laterality: N/A;   COLONOSCOPY WITH PROPOFOL N/A 06/26/2022   Procedure: COLONOSCOPY WITH PROPOFOL;  Surgeon: Daryel November, MD;  Location: [facility];  Service: Gastroenterology;  Laterality: N/A;   ENTEROSCOPY N/A 04/17/2022   Procedure: ENTEROSCOPY;  Surgeon: Jerene Bears, MD;  Location: [facility];  Service: Gastroenterology;  Laterality: N/A;   ENTEROSCOPY N/A 06/26/2022   Procedure: ENTEROSCOPY;  Surgeon: Daryel November, MD;  Location: [facility];  Service: Gastroenterology;  Laterality: N/A;   GIVENS CAPSULE STUDY N/A 06/26/2022   Procedure: GIVENS CAPSULE STUDY;  Surgeon: Daryel November, MD;  Location: [facility];  Service: Gastroenterology;  Laterality: N/A;   HEMOSTASIS CLIP PLACEMENT  04/17/2022   Procedure: HEMOSTASIS CLIP PLACEMENT;  Surgeon: Jerene Bears, MD;  Location: [facility];  Service: Gastroenterology;;   HEMOSTASIS CLIP PLACEMENT  06/26/2022   Procedure:  HEMOSTASIS CLIP PLACEMENT;  Surgeon: Daryel November, MD;  Location: [facility];  Service: Gastroenterology;;   HOT HEMOSTASIS N/A 04/17/2022   Procedure: HOT HEMOSTASIS (ARGON PLASMA COAGULATION/BICAP);  Surgeon: Jerene Bears, MD;  Location: [facility];   Service: Gastroenterology;  Laterality: N/A;   HOT HEMOSTASIS N/A 06/26/2022   Procedure: HOT HEMOSTASIS (ARGON PLASMA COAGULATION/BICAP);  Surgeon: Daryel November, MD;  Location: [facility];  Service: Gastroenterology;  Laterality: N/A;   POLYPECTOMY  04/17/2022   Procedure: POLYPECTOMY;  Surgeon: Jerene Bears, MD;  Location: [facility];  Service: Gastroenterology;;   POLYPECTOMY  06/26/2022   Procedure: POLYPECTOMY;  Surgeon: Daryel November, MD;  Location: [facility];  Service: Gastroenterology;;   [place] INJECTION  06/26/2022   Procedure: SUBMUCOSAL TATTOO INJECTION;  Surgeon: Daryel November, MD;  Location: [facility];  Service: Gastroenterology;;    SOCIAL HISTORY: Social History   Socioeconomic History   Marital status: Married    Spouse name: Not on file   Number of children: 0   Years of education: Not on file   Highest education level: Not on file  Occupational History   Not on file  Tobacco Use   Smoking status: Former    Years: 15.00    Types: Cigarettes   Smokeless tobacco: Never  Vaping Use   Vaping Use: Never used  Substance and Sexual Activity   Alcohol use: Not Currently   Drug use: Never   Sexual activity: Yes    Partners: Male  Other Topics Concern   Not on file  Social History Narrative   Not on file   Social Determinants of Health   Financial Resource Strain: Not on file  Food Insecurity: No Food Insecurity (06/24/2022)   Hunger Vital Sign    Worried About Running Out of Food in the Last Year: Never true    Ran Out of Food in the Last Year: Never true  Transportation Needs: No Transportation Needs (06/24/2022)   PRAPARE - Hydrologist (Medical): No    Lack of Transportation (Non-Medical): No  Physical Activity: Not on file  Stress: Not on file  Social Connections: Not on file  Intimate Partner Violence: Not At Risk (06/24/2022)   Humiliation, Afraid,  Rape, and Kick questionnaire    Fear of Current or Ex-Partner: No    Emotionally Abused: No    Physically Abused: No    Sexually Abused: No    FAMILY HISTORY: Family History  Problem Relation Age of Onset   Heart disease Mother    Valvular heart disease Mother    Healthy Sister    Alcoholism Half-Sister     ALLERGIES:  is allergic to aspirin and sulfa antibiotics.  MEDICATIONS:  Current Outpatient Medications  Medication Sig Dispense Refill   acetaminophen (TYLENOL) 500 MG tablet Take 1,000 mg by mouth every 6 (six) hours as needed for mild pain or headache.     Ascorbic Acid (VITAMIN C) 1000 MG tablet Take 500 mg by mouth in the morning and at bedtime.     chlorhexidine (PERIDEX) 0.12 % solution 5 mLs by Mouth Rinse route 2 (two) times daily.     cholecalciferol (VITAMIN D3) 25 MCG (1000 UNIT) tablet Take 1,000 Units by mouth daily.     cholestyramine light (PREVALITE) 4 g packet Take 1 packet (4 g total) by mouth 2 (two) times daily. 180 each 3   famotidine (PEPCID) 40 MG tablet  Take 1 tablet (40 mg total) by mouth daily. 90 tablet 3   folic acid (FOLVITE) 1 MG tablet TAKE 1 TABLET BY MOUTH EVERY DAY (Patient taking differently: Take 1 mg by mouth daily.) 90 tablet 1   glucosamine-chondroitin 500-400 MG tablet Take 1 tablet by mouth 2 (two) times daily.     hydroxyurea (HYDREA) 500 MG capsule TAKE 1 CAPSULE BY MOUTH TWICE A DAY 180 capsule 1   metoprolol tartrate (LOPRESSOR) 25 MG tablet TAKE 1/2 TABLET BY MOUTH 2 TIMES DAILY. 90 tablet 0   Multiple Vitamins-Minerals (MULTIVITAMIN ADULTS 50+) TABS Take 1 tablet by mouth daily.     Multiple Vitamins-Minerals (PRESERVISION AREDS 2+MULTI VIT PO) Take 1 capsule by mouth 2 (two) times daily.     sucralfate (CARAFATE) 1 g tablet Take 1 tablet (1 g total) by mouth 4 (four) times daily -  with meals and at bedtime. (Patient taking differently: Take 1 g by mouth at bedtime.) 360 tablet 3   No current facility-administered medications for  this visit.    REVIEW OF SYSTEMS:   Constitutional: ( - ) fevers, ( - )  chills , ( - ) night sweats Eyes: ( - ) blurriness of vision, ( - ) double vision, ( - ) watery eyes Ears, nose, mouth, throat, and face: ( - ) mucositis, ( - ) sore throat Respiratory: ( - ) cough, ( - ) dyspnea, ( - ) wheezes Cardiovascular: ( - ) palpitation, ( - ) chest discomfort, ( - ) lower extremity swelling Gastrointestinal:  ( - ) nausea, ( - ) heartburn, ( - ) change in bowel habits Skin: ( - ) abnormal skin rashes Lymphatics: ( - ) new lymphadenopathy, ( - ) easy bruising Neurological: ( - ) numbness, ( - ) tingling, ( - ) new weaknesses Behavioral/Psych: ( - ) mood change, ( - ) new changes  All other systems were reviewed with the patient and are negative.  PHYSICAL EXAMINATION: ECOG PERFORMANCE STATUS: 0 - Asymptomatic  Vitals:   10/18/22 1044  BP: 115/65  Pulse: 68  Resp: 13  Temp: 98.2 F (36.8 C)  SpO2: 100%   Filed Weights     GENERAL: Well-appearing elderly Caucasian female, alert, no distress and comfortable SKIN: skin color, texture, turgor are normal, no rashes or significant lesions EYES: conjunctiva are pink and non-injected, sclera clear LUNGS: clear to auscultation and percussion with normal breathing effort HEART: regular rate & rhythm and no murmurs and no lower extremity edema Musculoskeletal: no cyanosis of digits and no clubbing  PSYCH: alert & oriented x 3, fluent speech NEURO: no focal motor/sensory deficits  LABORATORY DATA:  I have reviewed the data as listed    Latest Ref Rng & Units 10/18/2022    9:44 AM 09/29/2022   12:42 PM 09/20/2022   11:28 AM  CBC  WBC 4.0 - 10.5 K/uL 5.6  5.2  6.2   Hemoglobin 12.0 - 15.0 g/dL 11.7  10.8  11.2   Hematocrit 36.0 - 46.0 % 35.4  33.5  33.5   Platelets 150 - 400 K/uL 475  334  504        Latest Ref Rng & Units 06/27/2022    3:52 AM 06/25/2022    3:50 AM 06/24/2022    2:21 PM  CMP  Glucose 70 - 99 mg/dL 138  105  115    BUN 8 - 23 mg/dL '5  15  15   '$ Creatinine 0.44 - 1.00 mg/dL 0.82  0.56  0.77   Sodium 135 - 145 mmol/L 139  139  139   Potassium 3.5 - 5.1 mmol/L 3.4  3.5  4.2   Chloride 98 - 111 mmol/L 111  113  106   CO2 22 - 32 mmol/L '22  23  28   '$ Calcium 8.9 - 10.3 mg/dL 8.9  8.6  9.4   Total Protein 6.5 - 8.1 g/dL  5.3  6.5   Total Bilirubin 0.3 - 1.2 mg/dL  0.4  0.6   Alkaline Phos 38 - 126 U/L  44  57   AST 15 - 41 U/L  21  24   ALT 0 - 44 U/L  16  20    RADIOGRAPHIC STUDIES: No results found.  ASSESSMENT & PLAN Kristina Osborne 72 y.o. female with medical history significant for essential thrombocytosis who presents for a follow up visit.   After review of the labs, review of the records, and discussion with the patient the patients findings are most consistent with thrombocytosis, reported due to essential thrombocythemia. We have received the records showing that she has a JAK2 mutation.  Additionally she will require a bone marrow biopsy in order to assure that essential thrombocythemia is the diagnosis.  In the interim we will have the patient continue her hydroxyurea as previously prescribed by her provider in [place].  # Essential thrombocythemia -- At this time we will need to confirm that the patient has essential thrombocythemia.  She will require a bone marrow biopsy to confirm this diagnosis --JAK2 testing received from patient's prior hematologist. Confirmed JAK2 mutation. Plan:   -- If and when the patient is agreeable we will schedule her for a bone marrow biopsy. --Hydroxyurea was previously held due to GI bleed and anemia.  Anemia is proving with IV iron therapy and therefore we can cotinue hydroxyurea at this time. --increase to 500 mg hydroxyurea BID.( Previously on 1500 mg hydroxyurea alternating with 1000 mg every other day) -- Labs today show white blood cell count 5.6, hemoglobin 11.7, MCV 109.9, and platelets of 475 --continue with labs q 2 weekly followed by a clinic  visit in 6 weeks.  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  --Patient has history of GI bleeding. Patient notes no active maroon/red/black stools.  Appreciate assistance of GI in the management of this case. --Labs today as noted above.  --continue to follow with q 2 weekly labs   No orders of the defined types were placed in this encounter.   All questions were answered. The patient knows to call the clinic with any problems, questions or concerns.  A total of more than 30 minutes were spent on this encounter with face-to-face time and non-face-to-face time, including preparing to see the patient, ordering tests and/or medications, counseling the patient and coordination of care as outlined above.   Ledell Peoples, MD Department of Hematology/Oncology [place] [facility] Phone: [phone] Pager: [phone] Email: [email or web address]  10/28/2022 7:47 PM

## 2022-10-26 ENCOUNTER — Other Ambulatory Visit: Payer: Self-pay | Admitting: Internal Medicine

## 2022-10-26 DIAGNOSIS — R21 Rash and other nonspecific skin eruption: Secondary | ICD-10-CM

## 2022-10-30 ENCOUNTER — Inpatient Hospital Stay: Payer: No Typology Code available for payment source | Attending: Hematology and Oncology

## 2022-10-30 ENCOUNTER — Other Ambulatory Visit: Payer: Self-pay

## 2022-10-30 ENCOUNTER — Telehealth: Payer: Self-pay

## 2022-10-30 DIAGNOSIS — D500 Iron deficiency anemia secondary to blood loss (chronic): Secondary | ICD-10-CM

## 2022-10-30 DIAGNOSIS — D473 Essential (hemorrhagic) thrombocythemia: Secondary | ICD-10-CM | POA: Insufficient documentation

## 2022-10-30 LAB — CBC WITH DIFFERENTIAL (CANCER CENTER ONLY)
Abs Immature Granulocytes: 0.03 10*3/uL (ref 0.00–0.07)
Basophils Absolute: 0 10*3/uL (ref 0.0–0.1)
Basophils Relative: 1 %
Eosinophils Absolute: 0 10*3/uL (ref 0.0–0.5)
Eosinophils Relative: 0 %
HCT: 28.3 % — ABNORMAL LOW (ref 36.0–46.0)
Hemoglobin: 9.3 g/dL — ABNORMAL LOW (ref 12.0–15.0)
Immature Granulocytes: 1 %
Lymphocytes Relative: 9 %
Lymphs Abs: 0.6 10*3/uL — ABNORMAL LOW (ref 0.7–4.0)
MCH: 38 pg — ABNORMAL HIGH (ref 26.0–34.0)
MCHC: 32.9 g/dL (ref 30.0–36.0)
MCV: 115.5 fL — ABNORMAL HIGH (ref 80.0–100.0)
Monocytes Absolute: 0.3 10*3/uL (ref 0.1–1.0)
Monocytes Relative: 6 %
Neutro Abs: 5.2 10*3/uL (ref 1.7–7.7)
Neutrophils Relative %: 83 %
Platelet Count: 518 10*3/uL — ABNORMAL HIGH (ref 150–400)
RBC: 2.45 MIL/uL — ABNORMAL LOW (ref 3.87–5.11)
RDW: 18.4 % — ABNORMAL HIGH (ref 11.5–15.5)
WBC Count: 6.1 10*3/uL (ref 4.0–10.5)
nRBC: 0 % (ref 0.0–0.2)

## 2022-10-30 NOTE — Telephone Encounter (Signed)
Erroneous entry

## 2022-10-30 NOTE — Telephone Encounter (Addendum)
Patient called requesting lab appointment be moved from 12/02/22 to today. Reports some bleeding over the weekend which has stopped.  Patient CBC resulted. HGB 9.2 and MCT 28.3  Routed to [facility]  for review of information.

## 2022-10-31 ENCOUNTER — Other Ambulatory Visit: Payer: Self-pay

## 2022-10-31 ENCOUNTER — Telehealth: Payer: Self-pay | Admitting: Gastroenterology

## 2022-10-31 ENCOUNTER — Other Ambulatory Visit: Payer: No Typology Code available for payment source

## 2022-10-31 ENCOUNTER — Telehealth: Payer: Self-pay | Admitting: Hematology and Oncology

## 2022-10-31 ENCOUNTER — Other Ambulatory Visit: Payer: Self-pay | Admitting: Physician Assistant

## 2022-10-31 DIAGNOSIS — K5520 Angiodysplasia of colon without hemorrhage: Secondary | ICD-10-CM

## 2022-10-31 DIAGNOSIS — C252 Malignant neoplasm of tail of pancreas: Secondary | ICD-10-CM

## 2022-10-31 LAB — IRON AND IRON BINDING CAPACITY (CC-WL,HP ONLY)
Iron: 27 ug/dL — ABNORMAL LOW (ref 28–170)
Saturation Ratios: 9 % — ABNORMAL LOW (ref 10.4–31.8)
TIBC: 309 ug/dL (ref 250–450)
UIBC: 282 ug/dL (ref 148–442)

## 2022-10-31 LAB — FERRITIN: Ferritin: 97 ng/mL (ref 11–307)

## 2022-10-31 NOTE — Telephone Encounter (Signed)
Patient returned call, wishes to speak to a nurse in regards to the recommendations given by [facility]. Please advise.

## 2022-10-31 NOTE — Telephone Encounter (Signed)
Inbound call from patient requesting a call back from a nurse, she said she was bleeding oveer the weekend.Please advise

## 2022-10-31 NOTE — Telephone Encounter (Signed)
History of AVM and states that she started bleeding on Friday and had rectal bleeding until Sunday. Oncology did lab work (see 10/30/22 results for CBC). She called [facility] after appt with Dr Fuller Plan on 06/2022 and they wanted to wait to schedule her for a double balloon enteroscopy because she had not had any recent bleeding.  I have advised her to call [facility] and make them aware of this episode.  She is being set up for IV iron by Oncology.  Any further recommendations Dr Fuller Plan?

## 2022-10-31 NOTE — Telephone Encounter (Signed)
Called patient per 3/4 secure chat to schedule for lab appointment. Left voicemail with appointment time. Will call back to f/u.

## 2022-10-31 NOTE — Telephone Encounter (Signed)
The patient has been notified of this information and all questions answered.

## 2022-10-31 NOTE — Telephone Encounter (Signed)
Attempted to reach pt and while speaking to her over the phone the call was disconnected. Attempted to reach pt back and line rings no answer no voice mail will try later and wait for a call back from the pt

## 2022-10-31 NOTE — Telephone Encounter (Signed)
Agree with anemia mgmt for Oncology and advising her to contact [facility] regarding her recurrent bleeding.

## 2022-11-01 ENCOUNTER — Inpatient Hospital Stay: Payer: No Typology Code available for payment source

## 2022-11-01 NOTE — Telephone Encounter (Signed)
The pt has been advised of Dr Lynne Leader recommendations she states she does not wish to go back to [place] and will stay with Dr Malissa Hippo.  She also does not want to set up the CT at this time.  She will call back if she changes her mind.  She did ask to make a follow up appt with Dr Fuller Plan and an appt was made for 12/12/22 at 2 am. FYI Dr Fuller Plan

## 2022-11-01 NOTE — Telephone Encounter (Signed)
Dr Fuller Plan the pt called Dr Kristina Osborne and was told that she should "just wait and live with the IDA and bleeding unless it worsens" per the pt.  She wants a second opinion and would like to know who you recommend.  Please advise

## 2022-11-01 NOTE — Telephone Encounter (Signed)
Since she has an established relationship with GI at [facility] in [place] so I suggest that she returns there if she would like another opinion.  For now I recommend following Dr. Sharee Pimple advice as outlined in his 10/13/2022 note. Accordingly we can proceed with an abd/pelvic CTA here. Defer to her hematologist regarding #2.   Given this complexity and the absence of active bleeding or a clear "smoking gun" target on VCE, I do not think that simply getting her in for DBE is the most appropriate course of action. Currently, since she is doing so well with her current supportive approach and has not needed PRBCs or frequent admissions for bleeding, I recommend sticking with this approach at present. If this progresses and her bleeding becomes more problematic then we discussed 3 possible management strategies: 1) DBE to directly investigate as much bowel as possible though I think the yield of this will be relatively low 2) Medical therapy with either octreotide or thalidomide the later of which ideally be guided by her Hematologist in light of her ET 3) Re-image her mesenteric vasculature with CT or MR angiography to see if there is something that could be embolized

## 2022-11-05 ENCOUNTER — Other Ambulatory Visit: Payer: Self-pay | Admitting: Physician Assistant

## 2022-11-07 ENCOUNTER — Ambulatory Visit: Payer: No Typology Code available for payment source

## 2022-11-07 ENCOUNTER — Telehealth: Payer: Self-pay | Admitting: Pharmacy Technician

## 2022-11-07 VITALS — BP 97/59 | HR 75 | Temp 98.7°F | Resp 16 | Ht 66.0 in | Wt 175.0 lb

## 2022-11-07 DIAGNOSIS — D500 Iron deficiency anemia secondary to blood loss (chronic): Secondary | ICD-10-CM | POA: Diagnosis not present

## 2022-11-07 DIAGNOSIS — K5520 Angiodysplasia of colon without hemorrhage: Secondary | ICD-10-CM | POA: Diagnosis not present

## 2022-11-07 MED ORDER — SODIUM CHLORIDE 0.9 % IV SOLN
1000.0000 mg | Freq: Once | INTRAVENOUS | Status: AC
  Administered 2022-11-07: 1000 mg via INTRAVENOUS
  Filled 2022-11-07: qty 10

## 2022-11-07 MED ORDER — ACETAMINOPHEN 325 MG PO TABS: 650.0000 mg | ORAL_TABLET | Freq: Once | ORAL | Status: DC

## 2022-11-07 NOTE — Progress Notes (Signed)
Diagnosis: Iron Deficiency Anemia  Provider:  Marshell Garfinkel MD  Procedure: Infusion  IV Type: Peripheral, IV Location: R Forearm  Monoferric (Ferric Derisomaltose), Dose: 1000 mg  Infusion Start Time: [id number] am  Infusion Stop Time: 1118 am  Post Infusion IV Care: Observation period completed and Peripheral IV Discontinued  Discharge: Condition: Good, Destination: Home . AVS Provided and AVS Declined  Performed by:  Oren Beckmann, RN

## 2022-11-07 NOTE — Telephone Encounter (Signed)
Auth Submission: APPROVED Payer: [employer] Medication & CPT/J Code(s) submitted: Monoferric (Ferrci derisomaltose) [phone] Route of submission (phone, fax, portal):  Phone # Fax # Auth type: Buy/Bill Units/visits requested: 1 Reference number: [id number] Approval from: 01/03/23 to 07/05/23

## 2022-11-10 ENCOUNTER — Encounter: Payer: Self-pay | Admitting: Physician Assistant

## 2022-11-15 ENCOUNTER — Other Ambulatory Visit: Payer: Self-pay | Admitting: Hematology and Oncology

## 2022-11-15 ENCOUNTER — Inpatient Hospital Stay: Payer: No Typology Code available for payment source

## 2022-11-15 ENCOUNTER — Telehealth: Payer: Self-pay | Admitting: Hematology and Oncology

## 2022-11-15 DIAGNOSIS — D500 Iron deficiency anemia secondary to blood loss (chronic): Secondary | ICD-10-CM

## 2022-11-15 NOTE — Telephone Encounter (Signed)
Called patient per 3/20 IB message to reschedule 3/20 lab appointment. Patient rescheduled and notified.

## 2022-11-16 ENCOUNTER — Other Ambulatory Visit: Payer: Self-pay

## 2022-11-16 ENCOUNTER — Inpatient Hospital Stay: Payer: No Typology Code available for payment source

## 2022-11-16 DIAGNOSIS — D473 Essential (hemorrhagic) thrombocythemia: Secondary | ICD-10-CM | POA: Diagnosis not present

## 2022-11-16 DIAGNOSIS — D500 Iron deficiency anemia secondary to blood loss (chronic): Secondary | ICD-10-CM

## 2022-11-16 LAB — CBC WITH DIFFERENTIAL (CANCER CENTER ONLY)
Abs Immature Granulocytes: 0.02 10*3/uL (ref 0.00–0.07)
Basophils Absolute: 0 10*3/uL (ref 0.0–0.1)
Basophils Relative: 1 %
Eosinophils Absolute: 0.2 10*3/uL (ref 0.0–0.5)
Eosinophils Relative: 3 %
HCT: 36.7 % (ref 36.0–46.0)
Hemoglobin: 11.8 g/dL — ABNORMAL LOW (ref 12.0–15.0)
Immature Granulocytes: 0 %
Lymphocytes Relative: 10 %
Lymphs Abs: 0.5 10*3/uL — ABNORMAL LOW (ref 0.7–4.0)
MCH: 37.3 pg — ABNORMAL HIGH (ref 26.0–34.0)
MCHC: 32.2 g/dL (ref 30.0–36.0)
MCV: 116.1 fL — ABNORMAL HIGH (ref 80.0–100.0)
Monocytes Absolute: 0.4 10*3/uL (ref 0.1–1.0)
Monocytes Relative: 6 %
Neutro Abs: 4.4 10*3/uL (ref 1.7–7.7)
Neutrophils Relative %: 80 %
Platelet Count: 366 10*3/uL (ref 150–400)
RBC: 3.16 MIL/uL — ABNORMAL LOW (ref 3.87–5.11)
RDW: 16.3 % — ABNORMAL HIGH (ref 11.5–15.5)
WBC Count: 5.5 10*3/uL (ref 4.0–10.5)
nRBC: 0 % (ref 0.0–0.2)

## 2022-11-16 LAB — IRON AND IRON BINDING CAPACITY (CC-WL,HP ONLY)
Iron: 81 ug/dL (ref 28–170)
Saturation Ratios: 27 % (ref 10.4–31.8)
TIBC: 305 ug/dL (ref 250–450)
UIBC: 224 ug/dL (ref 148–442)

## 2022-11-16 LAB — RETIC PANEL
Immature Retic Fract: 26.9 % — ABNORMAL HIGH (ref 2.3–15.9)
RBC.: 3.09 MIL/uL — ABNORMAL LOW (ref 3.87–5.11)
Retic Count, Absolute: 92.7 10*3/uL (ref 19.0–186.0)
Retic Ct Pct: 3 % (ref 0.4–3.1)
Reticulocyte Hemoglobin: 39.4 pg (ref >27.9)

## 2022-11-16 LAB — CMP (CANCER CENTER ONLY)
ALT: 20 U/L (ref 0–44)
AST: 23 U/L (ref 15–41)
Albumin: 4.4 g/dL (ref 3.5–5.0)
Alkaline Phosphatase: 56 U/L (ref 38–126)
Anion gap: 5 (ref 5–15)
BUN: 12 mg/dL (ref 8–23)
CO2: 29 mmol/L (ref 22–32)
Calcium: 9.6 mg/dL (ref 8.9–10.3)
Chloride: 105 mmol/L (ref 98–111)
Creatinine: 0.8 mg/dL (ref 0.44–1.00)
GFR, Estimated: >60 mL/min (ref >60)
Glucose, Bld: 94 mg/dL (ref 70–99)
Potassium: 4.7 mmol/L (ref 3.5–5.1)
Sodium: 139 mmol/L (ref 135–145)
Total Bilirubin: 0.4 mg/dL (ref 0.3–1.2)
Total Protein: 6.6 g/dL (ref 6.5–8.1)

## 2022-11-16 LAB — FERRITIN: Ferritin: 271 ng/mL (ref 11–307)

## 2022-11-21 ENCOUNTER — Other Ambulatory Visit: Payer: Self-pay | Admitting: Gastroenterology

## 2022-11-25 ENCOUNTER — Other Ambulatory Visit: Payer: Self-pay | Admitting: Hematology and Oncology

## 2022-11-26 ENCOUNTER — Emergency Department (HOSPITAL_BASED_OUTPATIENT_CLINIC_OR_DEPARTMENT_OTHER)
Admission: EM | Admit: 2022-11-26 | Discharge: 2022-11-26 | Disposition: A | Discharge status: Home / Self Care | Payer: No Typology Code available for payment source | Attending: Emergency Medicine | Admitting: Emergency Medicine

## 2022-11-26 ENCOUNTER — Emergency Department (HOSPITAL_BASED_OUTPATIENT_CLINIC_OR_DEPARTMENT_OTHER): Payer: No Typology Code available for payment source

## 2022-11-26 ENCOUNTER — Encounter (HOSPITAL_BASED_OUTPATIENT_CLINIC_OR_DEPARTMENT_OTHER): Payer: Self-pay

## 2022-11-26 ENCOUNTER — Other Ambulatory Visit: Payer: Self-pay

## 2022-11-26 DIAGNOSIS — R112 Nausea with vomiting, unspecified: Secondary | ICD-10-CM | POA: Insufficient documentation

## 2022-11-26 DIAGNOSIS — R109 Unspecified abdominal pain: Secondary | ICD-10-CM | POA: Diagnosis present

## 2022-11-26 LAB — COMPREHENSIVE METABOLIC PANEL
ALT: 20 U/L (ref 0–44)
AST: 29 U/L (ref 15–41)
Albumin: 4.4 g/dL (ref 3.5–5.0)
Alkaline Phosphatase: 55 U/L (ref 38–126)
Anion gap: 9 (ref 5–15)
BUN: 13 mg/dL (ref 8–23)
CO2: 27 mmol/L (ref 22–32)
Calcium: 10.2 mg/dL (ref 8.9–10.3)
Chloride: 105 mmol/L (ref 98–111)
Creatinine, Ser: 0.71 mg/dL (ref 0.44–1.00)
GFR, Estimated: >60 mL/min (ref >60)
Glucose, Bld: 112 mg/dL — ABNORMAL HIGH (ref 70–99)
Potassium: 3.6 mmol/L (ref 3.5–5.1)
Sodium: 141 mmol/L (ref 135–145)
Total Bilirubin: 0.4 mg/dL (ref 0.3–1.2)
Total Protein: 7.2 g/dL (ref 6.5–8.1)

## 2022-11-26 LAB — CBC
HCT: 38.6 % (ref 36.0–46.0)
Hemoglobin: 12.8 g/dL (ref 12.0–15.0)
MCH: 38.4 pg — ABNORMAL HIGH (ref 26.0–34.0)
MCHC: 33.2 g/dL (ref 30.0–36.0)
MCV: 115.9 fL — ABNORMAL HIGH (ref 80.0–100.0)
Platelets: 628 10*3/uL — ABNORMAL HIGH (ref 150–400)
RBC: 3.33 MIL/uL — ABNORMAL LOW (ref 3.87–5.11)
RDW: 17.3 % — ABNORMAL HIGH (ref 11.5–15.5)
WBC: 9.6 10*3/uL (ref 4.0–10.5)
nRBC: 0 % (ref 0.0–0.2)

## 2022-11-26 LAB — LIPASE, BLOOD: Lipase: 12 U/L (ref 11–51)

## 2022-11-26 MED ORDER — MORPHINE SULFATE (PF) 2 MG/ML IV SOLN
2.0000 mg | Freq: Once | INTRAVENOUS | Status: AC
  Administered 2022-11-26: 2 mg via INTRAVENOUS
  Filled 2022-11-26: qty 1

## 2022-11-26 MED ORDER — DICYCLOMINE HCL 20 MG PO TABS: 20.0000 mg | ORAL_TABLET | Freq: Two times a day (BID) | ORAL | 0 refills | Status: DC

## 2022-11-26 MED ORDER — SODIUM CHLORIDE 0.9 % IV BOLUS: 500.0000 mL | Freq: Once | INTRAVENOUS | Status: DC

## 2022-11-26 MED ORDER — ONDANSETRON HCL 4 MG/2ML IJ SOLN
4.0000 mg | Freq: Once | INTRAMUSCULAR | Status: AC
  Administered 2022-11-26: 4 mg via INTRAVENOUS
  Filled 2022-11-26: qty 2

## 2022-11-26 MED ORDER — ONDANSETRON 4 MG PO TBDP: 4.0000 mg | ORAL_TABLET | Freq: Three times a day (TID) | ORAL | 0 refills | Status: DC | PRN

## 2022-11-26 MED ORDER — LACTATED RINGERS IV BOLUS
1000.0000 mL | Freq: Once | INTRAVENOUS | Status: AC
  Administered 2022-11-26: 1000 mL via INTRAVENOUS

## 2022-11-26 NOTE — ED Triage Notes (Signed)
Patient here POV from Home.  Endorses ABD Pain that began last PM and subsided this AM. Has been having Vomiting as well since Pain began and today began to have Diarrhea as well. No Known Fever. No Dysuria. Pain is across Lower ABD today.   Went to UC today and given IM Phenergan which has helped alleviate Nausea. Sent for further assessment.

## 2022-11-26 NOTE — Discharge Instructions (Signed)
You came to the emergency department today with abdominal pain, nausea and vomiting.  Your workup is benign.  Lab work is reassuring.  We discussed the possibility of CT scan however you agreed we likely do not need this at this time.  I have sent 2 medications to your pharmacy.  The first is ondansetron which is a nausea medication.  The second is by close amine which is for abdominal cramping.  You may also use Tylenol for any pain.  Please do not hesitate to return to the emergency department with any worsening or recurring symptoms.  More importantly I would like you to make an appointment with your gastroenterologist if you continue to have any symptoms in 48 hours.  It was a pleasure to meet you today and we hope you continue to feel better

## 2022-11-26 NOTE — ED Provider Notes (Signed)
[facility] Provider Note   CSN: [id number] Arrival date & time: 11/26/22  1538     History  Chief Complaint  Patient presents with   Abdominal Pain    Kristina Osborne is a 72 y.o. female with a medical history of GERD, benign neoplasm of the colon and GI bleeding presenting today with concern for abdominal pain.  She reports yesterday she started to have some abdominal pain and over the night became nauseous with multiple episodes of emesis.  No diarrhea, last bowel movement at 2 PM today.  Denies any dysuria, hematuria, frequency or urgency.  Was seen at urgent care and sent here for further evaluation.  She reports that her family was sick last week with a GI bug.   Abdominal Pain      Home Medications Prior to Admission medications   Medication Sig Start Date End Date Taking? Authorizing Provider  acetaminophen (TYLENOL) 500 MG tablet Take 1,000 mg by mouth every 6 (six) hours as needed for mild pain or headache.    [provider]  Ascorbic Acid (VITAMIN C) 1000 MG tablet Take 500 mg by mouth in the morning and at bedtime.    [provider]  chlorhexidine (PERIDEX) 0.12 % solution 5 mLs by Mouth Rinse route 2 (two) times daily. 05/29/22   [provider]  cholecalciferol (VITAMIN D3) 25 MCG (1000 UNIT) tablet Take 1,000 Units by mouth daily.    [provider]  cholestyramine light (PREVALITE) 4 g packet Take 1 packet (4 g total) by mouth 2 (two) times daily. 07/17/22   Biagio Borg, MD  famotidine (PEPCID) 40 MG tablet TAKE 1 TABLET BY MOUTH EVERY DAY 11/21/22   Ladene Artist, MD  folic acid (FOLVITE) 1 MG tablet TAKE 1 TABLET BY MOUTH EVERY DAY 11/25/22   Orson Slick, MD  glucosamine-chondroitin 500-400 MG tablet Take 1 tablet by mouth 2 (two) times daily.    [provider]  hydroxyurea (HYDREA) 500 MG capsule TAKE 1 CAPSULE BY MOUTH TWICE A DAY 05/23/22   Orson Slick, MD   metoprolol tartrate (LOPRESSOR) 25 MG tablet TAKE 1/2 TABLET BY MOUTH 2 TIMES DAILY. 10/12/22   Hilty, Nadean Corwin, MD  Multiple Vitamins-Minerals (MULTIVITAMIN ADULTS 50+) TABS Take 1 tablet by mouth daily.    [provider]  Multiple Vitamins-Minerals (PRESERVISION AREDS 2+MULTI VIT PO) Take 1 capsule by mouth 2 (two) times daily.    [provider]  sucralfate (CARAFATE) 1 g tablet Take 1 tablet (1 g total) by mouth 4 (four) times daily -  with meals and at bedtime. Patient taking differently: Take 1 g by mouth at bedtime. 09/20/21 06/28/22  Ladene Artist, MD      Allergies    Aspirin and Sulfa antibiotics    Review of Systems   Review of Systems  Gastrointestinal:  Positive for abdominal pain.    Physical Exam Updated Vital Signs BP 124/74   Pulse 95   Temp 98.4 F (36.9 C) (Oral)   Resp 17   Ht 5\' 6"  (1.676 m)   Wt 77.1 kg   LMP  (LMP Unknown)   SpO2 100%   BMI 27.44 kg/m  Physical Exam Vitals and nursing note reviewed.  Constitutional:      General: She is not in acute distress.    Appearance: Normal appearance. She is not ill-appearing.  HENT:     Head: Normocephalic and atraumatic.  Eyes:  General: No scleral icterus.    Conjunctiva/sclera: Conjunctivae normal.  Pulmonary:     Effort: Pulmonary effort is normal. No respiratory distress.  Abdominal:     General: Abdomen is flat.     Palpations: Abdomen is soft.     Tenderness: There is abdominal tenderness in the epigastric area and periumbilical area.     Hernia: No hernia is present.  Skin:    Findings: No rash.  Neurological:     Mental Status: She is alert.  Psychiatric:        Mood and Affect: Mood normal.     ED Results / Procedures / Treatments   Labs (all labs ordered are listed, but only abnormal results are displayed) Labs Reviewed  CBC - Abnormal; Notable for the following components:      Result Value   RBC 3.33 (*)    MCV 115.9 (*)    MCH 38.4 (*)    RDW 17.3 (*)     Platelets 628 (*)    All other components within normal limits  URINALYSIS, ROUTINE W REFLEX MICROSCOPIC  COMPREHENSIVE METABOLIC PANEL  LIPASE, BLOOD    EKG None  Radiology No results found.  Procedures Procedures   Medications Ordered in ED Medications  sodium chloride 0.9 % bolus 500 mL (has no administration in time range)  lactated ringers bolus 1,000 mL (has no administration in time range)  ondansetron (ZOFRAN) injection 4 mg (has no administration in time range)    ED Course/ Medical Decision Making/ A&P                             Medical Decision Making Amount and/or Complexity of Data Reviewed Labs: ordered. Radiology: ordered.  Risk Prescription drug management.   This is a 72 year old female who presents to the ED for concern of abdominal pain. The differential diagnosis for generalized abdominal pain includes, but is not limited to AAA, gastroenteritis, appendicitis, Bowel obstruction, Bowel perforation. Gastroparesis, DKA, Hernia, Inflammatory bowel disease, mesenteric ischemia, pancreatitis, peritonitis SBP, volvulus.     This is not an exhaustive differential.    Past Medical History / Co-morbidities / Social History: GERD and chronic GI bleed history  She experiences recurrent GI bleeding and follows with [facility].  Denies this today.   Additional history: Patient had a colonoscopy and enteroscopy done in October 2023 and underwent polypectomy.  No other abnormalities   Physical Exam: Pertinent physical exam findings include Generalized abdominal pain, worse in the epigastrium and periumbilical regions  Lab Tests: I ordered, and personally interpreted labs.  The pertinent results include: Normal white count, normal hemoglobin Platelets 623.  She has a history of thrombocytosis and is anticoagulated due to this.  No leg swelling, chest pain or shortness of breath.    Medications: I ordered medication including fluids, Zofran and  morphine.. Reevaluation of the patient after these medicines showed that the patient improved. I have reviewed the patients home medicines and have made adjustments as needed.   MDM/Disposition: This is a 72 year old female who presented today with generalized abdominal pain, nausea and vomiting for the past 24 hours.  Of note she does have sick contacts.  On physical exam she is relatively well-appearing.  Not tachycardic or tachypneic.  Abdominal pain was generalized.  Originally ordered CT scan however there was trouble with patient's IV.  I went to the bedside and we engaged in shared decision-making and ultimately came to the conclusion that  we did not need a CT scan, especially given patient's normal white count, no fever or tachycardia.  She does have a thrombocytosis which is a chronic issue.  She is anticoagulated and not showing any signs of acute DVT.  She has been unable to hold down her medications so hopefully this will resolve as she tolerates p.o. intake.  My suspicion is that she has a GI illness similar to her family.  Patient is tolerating p.o. intake and not septic.  Leave that she is stable to follow-up outpatient with her gastroenterology team as needed.  Will prescribe ODT Zofran as well as Bentyl for any abdominal cramping.  She is agreeable to this plan and stable for discharge. Final Clinical Impression(s) / ED Diagnoses Final diagnoses:  Nausea and vomiting, unspecified vomiting type    Rx / DC Orders ED Discharge Orders          Ordered    ondansetron (ZOFRAN-ODT) 4 MG disintegrating tablet  Every 8 hours PRN        11/26/22 1910    dicyclomine (BENTYL) 20 MG tablet  2 times daily        11/26/22 1934           Results and diagnoses were explained to the patient. Return precautions discussed in full. Patient had no additional questions and expressed complete understanding.   This chart was dictated using voice recognition software.  Despite best efforts to  proofread,  errors can occur which can change the documentation meaning.     Rhae Hammock, PA-C 11/26/22 1939    Kemper Durie, [place] 11/26/22 2205

## 2022-11-26 NOTE — ED Notes (Signed)
Patient transported to CT 

## 2022-11-28 ENCOUNTER — Telehealth: Payer: Self-pay

## 2022-11-28 NOTE — Transitions of Care (Post Inpatient/ED Visit) (Unsigned)
   11/28/2022  Name: Kristina Osborne MRN: [id number] DOB: 03/14/1951  Today's TOC FU Call Status: Today's TOC FU Call Status:: Unsuccessul Call (1st Attempt) Unsuccessful Call (1st Attempt) Date: 11/28/22  Attempted to reach the patient regarding the most recent Inpatient/ED visit.  Follow Up Plan: Additional outreach attempts will be made to reach the patient to complete the Transitions of Care (Post Inpatient/ED visit) call.   Signature Juanda Crumble, [place] Direct Dial [phone]

## 2022-11-29 ENCOUNTER — Inpatient Hospital Stay: Payer: No Typology Code available for payment source | Attending: Nurse Practitioner

## 2022-11-29 ENCOUNTER — Inpatient Hospital Stay (HOSPITAL_BASED_OUTPATIENT_CLINIC_OR_DEPARTMENT_OTHER): Payer: No Typology Code available for payment source | Admitting: Hematology and Oncology

## 2022-11-29 ENCOUNTER — Other Ambulatory Visit: Payer: Self-pay | Admitting: *Deleted

## 2022-11-29 ENCOUNTER — Other Ambulatory Visit: Payer: Self-pay

## 2022-11-29 VITALS — BP 95/60 | HR 62 | Temp 97.8°F | Resp 15

## 2022-11-29 DIAGNOSIS — D500 Iron deficiency anemia secondary to blood loss (chronic): Secondary | ICD-10-CM

## 2022-11-29 DIAGNOSIS — K5520 Angiodysplasia of colon without hemorrhage: Secondary | ICD-10-CM | POA: Diagnosis not present

## 2022-11-29 DIAGNOSIS — D509 Iron deficiency anemia, unspecified: Secondary | ICD-10-CM | POA: Insufficient documentation

## 2022-11-29 DIAGNOSIS — Z87891 Personal history of nicotine dependence: Secondary | ICD-10-CM | POA: Diagnosis not present

## 2022-11-29 DIAGNOSIS — Z79899 Other long term (current) drug therapy: Secondary | ICD-10-CM | POA: Insufficient documentation

## 2022-11-29 DIAGNOSIS — D473 Essential (hemorrhagic) thrombocythemia: Secondary | ICD-10-CM | POA: Diagnosis present

## 2022-11-29 LAB — CBC WITH DIFFERENTIAL (CANCER CENTER ONLY)
Abs Immature Granulocytes: 0.02 10*3/uL (ref 0.00–0.07)
Basophils Absolute: 0 10*3/uL (ref 0.0–0.1)
Basophils Relative: 1 %
Eosinophils Absolute: 0 10*3/uL (ref 0.0–0.5)
Eosinophils Relative: 0 %
HCT: 34.5 % — ABNORMAL LOW (ref 36.0–46.0)
Hemoglobin: 11.5 g/dL — ABNORMAL LOW (ref 12.0–15.0)
Immature Granulocytes: 1 %
Lymphocytes Relative: 14 %
Lymphs Abs: 0.6 10*3/uL — ABNORMAL LOW (ref 0.7–4.0)
MCH: 38.5 pg — ABNORMAL HIGH (ref 26.0–34.0)
MCHC: 33.3 g/dL (ref 30.0–36.0)
MCV: 115.4 fL — ABNORMAL HIGH (ref 80.0–100.0)
Monocytes Absolute: 0.4 10*3/uL (ref 0.1–1.0)
Monocytes Relative: 10 %
Neutro Abs: 3.3 10*3/uL (ref 1.7–7.7)
Neutrophils Relative %: 74 %
Platelet Count: 566 10*3/uL — ABNORMAL HIGH (ref 150–400)
RBC: 2.99 MIL/uL — ABNORMAL LOW (ref 3.87–5.11)
RDW: 15.7 % — ABNORMAL HIGH (ref 11.5–15.5)
WBC Count: 4.3 10*3/uL (ref 4.0–10.5)
nRBC: 0 % (ref 0.0–0.2)

## 2022-11-29 NOTE — Transitions of Care (Post Inpatient/ED Visit) (Signed)
   11/29/2022  Name: Kristina Osborne MRN: [id number] DOB: 1950/09/24  Today's TOC FU Call Status: Today's TOC FU Call Status:: Successful TOC FU Call Competed Unsuccessful Call (1st Attempt) Date: 11/28/22 [facility] FU Call Complete Date: 11/29/22  Transition Care Management Follow-up Telephone Call Date of Discharge: 11/26/22 Discharge Facility: [facility] ([facility]) Type of Discharge: Emergency Department Reason for ED Visit: Other: (nausea / vomiting) How have you been since you were released from the hospital?: Better Any questions or concerns?: No  Items Reviewed: Did you receive and understand the discharge instructions provided?: Yes Medications obtained and verified?: Yes (Medications Reviewed) Any new allergies since your discharge?: No Dietary orders reviewed?: Yes Do you have support at home?: No  Home Care and Equipment/Supplies: [facility] Ordered?: NA Any new equipment or medical supplies ordered?: NA  Functional Questionnaire: Do you need assistance with bathing/showering or dressing?: No Do you need assistance with meal preparation?: No Do you need assistance with eating?: No Do you have difficulty maintaining continence: No Do you need assistance with getting out of bed/getting out of a chair/moving?: No Do you have difficulty managing or taking your medications?: No  Follow up appointments reviewed: PCP Follow-up appointment confirmed?: [facility] Follow-up appointment confirmed?: NA Do you need transportation to your follow-up appointment?: No Do you understand care options if your condition(s) worsen?: Yes-patient verbalized understanding    [place], [place] Direct Dial [phone]

## 2022-11-29 NOTE — Progress Notes (Signed)
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 Lorenso Courier  10/26/2021: WBC 6.7, Hgb 13.3, MCV 121.7, Plt 204 03/10/2022: WBC 3.7, Hgb 11.2, MCV 120, Plt 261. Decrease hydroxyurea to 1000 mg BID due to cytopenias.  06/24/2022-06/27/2022: Admitted with worsening GI bleed. Hydroxyurea held.  08/01/2022: WBC 4.4, hemoglobin 9.8, MCV 105.1, and platelets of 190  Interval History:  Evelisse Pacifico 72 y.o. female with medical history significant for essential thrombocytosis who presents for a TELEPHONE follow up visit. The patient's last visit was on 10/18/2022. In the interim since the last visit Ms. Gennett has continued taking hydroxyurea 500 mg PO daily.   On exam today Mrs. Fausnaugh reports she had a very eventful episode on 11/26/2022.  She began developing nausea, vomiting, and diarrhea.  She reports that so much fluid was coming out of her she was having difficulty replenishing it.  She had presyncopal episodes as well and went to the urgent care.  She was having severe abdominal pain at that time and was given morphine which greatly improved her symptoms.  After that her bowel pain improved.  She was prescribed Bentyl and sent home afterwards.  She did not have any other infectious symptoms at that time.  She reports that she is "on the mend".  She is drinking more fluid and feeling well.  During this episode she did not take any of her medications for 2.5 days.  She notes also that the nausea and vomiting as well as diarrhea are resolving and she does not think they were connected to her  hydroxyurea therapy..  She also denies any signs or symptoms concerning for VTE including shortness of breath, leg swelling, or chest pain.  A full 10 point ROS is listed below.   MEDICAL HISTORY:  Past Medical History:  Diagnosis Date   ABLA (acute blood loss anemia) 04/16/2022   Congenital malformation of intestinal fixation    Gastritis    Gastroesophageal reflux disease without esophagitis 09/10/2021   IDA (iron deficiency anemia)    Mitral valve prolapse    Thrombocytosis     SURGICAL HISTORY: Past Surgical History:  Procedure Laterality Date   BIOPSY  06/26/2022   Procedure: BIOPSY;  Surgeon: Daryel November, MD;  Location: [facility];  Service: Gastroenterology;;   COLONOSCOPY N/A 04/17/2022   Procedure: COLONOSCOPY;  Surgeon: Jerene Bears, MD;  Location: [facility];  Service: Gastroenterology;  Laterality: N/A;   COLONOSCOPY WITH PROPOFOL N/A 06/26/2022   Procedure: COLONOSCOPY WITH PROPOFOL;  Surgeon: Daryel November, MD;  Location: [facility];  Service: Gastroenterology;  Laterality: N/A;   ENTEROSCOPY N/A 04/17/2022   Procedure: ENTEROSCOPY;  Surgeon: Jerene Bears, MD;  Location: [facility];  Service: Gastroenterology;  Laterality: N/A;   ENTEROSCOPY N/A 06/26/2022   Procedure: ENTEROSCOPY;  Surgeon: Daryel November, MD;  Location: [facility];  Service: Gastroenterology;  Laterality: N/A;   GIVENS CAPSULE STUDY N/A 06/26/2022   Procedure: GIVENS CAPSULE STUDY;  Surgeon: Daryel November, MD;  Location: [facility];  Service: Gastroenterology;  Laterality: N/A;   HEMOSTASIS CLIP PLACEMENT  04/17/2022   Procedure: HEMOSTASIS CLIP  PLACEMENT;  Surgeon: Jerene Bears, MD;  Location: [facility];  Service: Gastroenterology;;   HEMOSTASIS CLIP PLACEMENT  06/26/2022   Procedure: HEMOSTASIS CLIP PLACEMENT;  Surgeon: Daryel November, MD;  Location: [facility];  Service: Gastroenterology;;   HOT HEMOSTASIS N/A 04/17/2022   Procedure: HOT HEMOSTASIS  (ARGON PLASMA COAGULATION/BICAP);  Surgeon: Jerene Bears, MD;  Location: [facility];  Service: Gastroenterology;  Laterality: N/A;   HOT HEMOSTASIS N/A 06/26/2022   Procedure: HOT HEMOSTASIS (ARGON PLASMA COAGULATION/BICAP);  Surgeon: Daryel November, MD;  Location: [facility];  Service: Gastroenterology;  Laterality: N/A;   POLYPECTOMY  04/17/2022   Procedure: POLYPECTOMY;  Surgeon: Jerene Bears, MD;  Location: [facility];  Service: Gastroenterology;;   POLYPECTOMY  06/26/2022   Procedure: POLYPECTOMY;  Surgeon: Daryel November, MD;  Location: [facility];  Service: Gastroenterology;;   [place] INJECTION  06/26/2022   Procedure: SUBMUCOSAL TATTOO INJECTION;  Surgeon: Daryel November, MD;  Location: [facility];  Service: Gastroenterology;;    SOCIAL HISTORY: Social History   Socioeconomic History   Marital status: Married    Spouse name: Not on file   Number of children: 0   Years of education: Not on file   Highest education level: Not on file  Occupational History   Not on file  Tobacco Use   Smoking status: Former    Years: 15    Types: Cigarettes   Smokeless tobacco: Never  Vaping Use   Vaping Use: Never used  Substance and Sexual Activity   Alcohol use: Not Currently   Drug use: Never   Sexual activity: Yes    Partners: Male  Other Topics Concern   Not on file  Social History Narrative   Not on file   Social Determinants of Health   Financial Resource Strain: Not on file  Food Insecurity: No Food Insecurity (06/24/2022)   Hunger Vital Sign    Worried About Running Out of Food in the Last Year: Never true    Ran Out of Food in the Last Year: Never true  Transportation Needs: No Transportation Needs (06/24/2022)   PRAPARE - Hydrologist (Medical): No    Lack of Transportation (Non-Medical): No  Physical Activity: Not on file  Stress: Not on file  Social Connections: Not  on file  Intimate Partner Violence: Not At Risk (06/24/2022)   Humiliation, Afraid, Rape, and Kick questionnaire    Fear of Current or Ex-Partner: No    Emotionally Abused: No    Physically Abused: No    Sexually Abused: No    FAMILY HISTORY: Family History  Problem Relation Age of Onset   Heart disease Mother    Valvular heart disease Mother    Healthy Sister    Alcoholism Half-Sister     ALLERGIES:  is allergic to aspirin and sulfa antibiotics.  MEDICATIONS:  Current Outpatient Medications  Medication Sig Dispense Refill   acetaminophen (TYLENOL) 500 MG tablet Take 1,000 mg by mouth every 6 (six) hours as needed for mild pain or headache.     Ascorbic Acid (VITAMIN C) 1000 MG tablet Take 500 mg by mouth in the morning and at bedtime.     chlorhexidine (PERIDEX) 0.12 % solution 5 mLs by Mouth Rinse route as needed.     cholecalciferol (VITAMIN D3) 25 MCG (1000 UNIT) tablet Take 1,000 Units by mouth daily.     dicyclomine (BENTYL) 20 MG tablet Take  1 tablet (20 mg total) by mouth 2 (two) times daily for 5 days. (Patient taking differently: Take 20 mg by mouth as needed.) 10 tablet 0   famotidine (PEPCID) 40 MG tablet TAKE 1 TABLET BY MOUTH EVERY DAY 90 tablet 1   folic acid (FOLVITE) 1 MG tablet TAKE 1 TABLET BY MOUTH EVERY DAY 90 tablet 1   glucosamine-chondroitin 500-400 MG tablet Take 1 tablet by mouth 2 (two) times daily.     hydroxyurea (HYDREA) 500 MG capsule TAKE 1 CAPSULE BY MOUTH TWICE A DAY 180 capsule 1   Multiple Vitamins-Minerals (MULTIVITAMIN ADULTS 50+) TABS Take 1 tablet by mouth daily.     cholestyramine light (PREVALITE) 4 g packet Take 1 packet (4 g total) by mouth 2 (two) times daily. 180 each 3   metoprolol tartrate (LOPRESSOR) 25 MG tablet TAKE 1/2 TABLET BY MOUTH 2 TIMES DAILY. 90 tablet 0   Multiple Vitamins-Minerals (PRESERVISION AREDS 2+MULTI VIT PO) Take 1 capsule by mouth 2 (two) times daily.     ondansetron (ZOFRAN-ODT) 4 MG disintegrating tablet Take  1 tablet (4 mg total) by mouth every 8 (eight) hours as needed for nausea or vomiting. 20 tablet 0   sucralfate (CARAFATE) 1 g tablet Take 1 tablet (1 g total) by mouth 4 (four) times daily -  with meals and at bedtime. (Patient taking differently: Take 1 g by mouth at bedtime.) 360 tablet 3   No current facility-administered medications for this visit.    REVIEW OF SYSTEMS:   Constitutional: ( - ) fevers, ( - )  chills , ( - ) night sweats Eyes: ( - ) blurriness of vision, ( - ) double vision, ( - ) watery eyes Ears, nose, mouth, throat, and face: ( - ) mucositis, ( - ) sore throat Respiratory: ( - ) cough, ( - ) dyspnea, ( - ) wheezes Cardiovascular: ( - ) palpitation, ( - ) chest discomfort, ( - ) lower extremity swelling Gastrointestinal:  ( - ) nausea, ( - ) heartburn, ( - ) change in bowel habits Skin: ( - ) abnormal skin rashes Lymphatics: ( - ) new lymphadenopathy, ( - ) easy bruising Neurological: ( - ) numbness, ( - ) tingling, ( - ) new weaknesses Behavioral/Psych: ( - ) mood change, ( - ) new changes  All other systems were reviewed with the patient and are negative.  PHYSICAL EXAMINATION: TELEPHONE VISIT NO PHYSICAL EXAM  LABORATORY DATA:  I have reviewed the data as listed    Latest Ref Rng & Units 11/29/2022   11:04 AM 11/26/2022    4:24 PM 11/16/2022   10:32 AM  CBC  WBC 4.0 - 10.5 K/uL 4.3  9.6  5.5   Hemoglobin 12.0 - 15.0 g/dL 11.5  12.8  11.8   Hematocrit 36.0 - 46.0 % 34.5  38.6  36.7   Platelets 150 - 400 K/uL 566  628  366        Latest Ref Rng & Units 11/26/2022    6:08 PM 11/16/2022   10:32 AM 06/27/2022    3:52 AM  CMP  Glucose 70 - 99 mg/dL 112  94  138   BUN 8 - 23 mg/dL 13  12  5    Creatinine 0.44 - 1.00 mg/dL 0.71  0.80  0.82   Sodium 135 - 145 mmol/L 141  139  139   Potassium 3.5 - 5.1 mmol/L 3.6  4.7  3.4   Chloride 98 - 111 mmol/L 105  105  111   CO2 22 - 32 mmol/L 27  29  22    Calcium 8.9 - 10.3 mg/dL 10.2  9.6  8.9   Total Protein 6.5 -  8.1 g/dL 7.2  6.6    Total Bilirubin 0.3 - 1.2 mg/dL 0.4  0.4    Alkaline Phos 38 - 126 U/L 55  56    AST 15 - 41 U/L 29  23    ALT 0 - 44 U/L 20  20     RADIOGRAPHIC STUDIES: No results found.  ASSESSMENT & PLAN Shayne Landsverk 72 y.o. female with medical history significant for essential thrombocytosis who presents for a follow up visit.   After review of the labs, review of the records, and discussion with the patient the patients findings are most consistent with thrombocytosis, reported due to essential thrombocythemia. We have received the records showing that she has a JAK2 mutation.  Additionally she will require a bone marrow biopsy in order to assure that essential thrombocythemia is the diagnosis.  In the interim we will have the patient continue her hydroxyurea as previously prescribed by her provider in [place].  # Essential thrombocythemia -- At this time we will need to confirm that the patient has essential thrombocythemia.  She will require a bone marrow biopsy to confirm this diagnosis --JAK2 testing received from patient's prior hematologist. Confirmed JAK2 mutation. Plan:   -- If and when the patient is agreeable we will schedule her for a bone marrow biopsy. --Hydroxyurea was previously held due to GI bleed and anemia.  Anemia is proving with IV iron therapy and therefore we can cotinue hydroxyurea at this time. --increase to 500 mg hydroxyurea BID.( Previously on 1500 mg hydroxyurea alternating with 1000 mg every other day) -- Labs today show white blood cell count 4.3, hemoglobin 1.5, MCV 115.4, and platelets of 566 --continue with labs q 4 weekly followed by a clinic visit in 8 weeks.  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   --Patient has history of GI bleeding. Patient notes no active maroon/red/black stools.  Appreciate  assistance of GI in the management of this case. --Labs today as noted above.  --continue to follow with q 4 weekly labs   No orders of the defined types were placed in this encounter.   All questions were answered. The patient knows to call the clinic with any problems, questions or concerns.  A total of more than 30 minutes were spent on this encounter with face-to-face time and non-face-to-face time, including preparing to see the patient, ordering tests and/or medications, counseling the patient and coordination of care as outlined above.   Ledell Peoples, MD Department of Hematology/Oncology [place] [facility] Phone: [phone] Pager: [phone] Email: [email or web address]  11/29/2022 5:26 PM

## 2022-11-30 ENCOUNTER — Telehealth: Payer: Self-pay | Admitting: Hematology and Oncology

## 2022-11-30 NOTE — Telephone Encounter (Signed)
Called patient per 4/3 los notes to schedule f/u. Patient scheduled and notified.

## 2022-12-11 ENCOUNTER — Ambulatory Visit (INDEPENDENT_AMBULATORY_CARE_PROVIDER_SITE_OTHER): Payer: No Typology Code available for payment source | Admitting: Family Medicine

## 2022-12-11 ENCOUNTER — Other Ambulatory Visit: Payer: Self-pay | Admitting: Family Medicine

## 2022-12-11 ENCOUNTER — Encounter: Payer: Self-pay | Admitting: Family Medicine

## 2022-12-11 VITALS — BP 116/72 | HR 68 | Temp 98.5°F | Ht 66.0 in | Wt 170.0 lb

## 2022-12-11 DIAGNOSIS — H1031 Unspecified acute conjunctivitis, right eye: Secondary | ICD-10-CM | POA: Diagnosis not present

## 2022-12-11 DIAGNOSIS — J302 Other seasonal allergic rhinitis: Secondary | ICD-10-CM

## 2022-12-11 MED ORDER — LEVOCETIRIZINE DIHYDROCHLORIDE 5 MG PO TABS: 5.0000 mg | ORAL_TABLET | Freq: Every evening | ORAL | 2 refills | Status: DC

## 2022-12-11 MED ORDER — POLYMYXIN B-TRIMETHOPRIM 10000-0.1 UNIT/ML-% OP SOLN: 1.0000 [drp] | OPHTHALMIC | 0 refills | Status: DC

## 2022-12-11 MED ORDER — FLUTICASONE PROPIONATE 50 MCG/ACT NA SUSP: 2.0000 | Freq: Every day | NASAL | 0 refills | Status: DC

## 2022-12-11 NOTE — Progress Notes (Signed)
Assessment & Plan:  1. Seasonal allergies Education provided on allergies.  Encouraged to use Flonase in the morning and Xyzal at night. - levocetirizine (XYZAL) 5 MG tablet; Take 1 tablet (5 mg total) by mouth every evening.  Dispense: 30 tablet; Refill: 2 - fluticasone (FLONASE) 50 MCG/ACT nasal spray; Place 2 sprays into both nostrils daily.  Dispense: 16 g; Refill: 0  2. Acute bacterial conjunctivitis of right eye Education provided on bacterial conjunctivitis. - trimethoprim-polymyxin b (POLYTRIM) ophthalmic solution; Place 1 drop into the right eye every 4 (four) hours for 7 days.  Dispense: 10 mL; Refill: 0   No results found for any visits on 12/11/22.  Follow up plan: Return if symptoms worsen or fail to improve.  Deliah Boston, MSN, APRN, FNP-C  Subjective:  HPI: Kristina Osborne is a 72 y.o. female presenting on 12/11/2022 for Cough (Slight, facial pressure, and nasal drainage x 4-5 days /No fever, no body aches, no SOB )  Patient is accompanied by her husband, who she is okay with being present.  Patient complains of cough, facial pain/pressure, postnasal drainage, and scratchy throat . Right eye matted shut this morning; drainage is yellow/green. She denies fever, shortness of breath, wheezing, and body aches . Onset of symptoms was  4-5  days ago, gradually improving since that time. She is drinking plenty of fluids. Evaluation to date: none. Treatment to date: cough suppressants and decongestants. She does not smoke.    ROS: Negative unless specifically indicated above in HPI.   Relevant past medical history reviewed and updated as indicated.   Allergies and medications reviewed and updated.   Current Outpatient Medications:    acetaminophen (TYLENOL) 500 MG tablet, Take 1,000 mg by mouth every 6 (six) hours as needed for mild pain or headache., Disp: , Rfl:    Ascorbic Acid (VITAMIN C) 1000 MG tablet, Take 500 mg by mouth in the morning and at bedtime., Disp: ,  Rfl:    chlorhexidine (PERIDEX) 0.12 % solution, 5 mLs by Mouth Rinse route as needed., Disp: , Rfl:    cholecalciferol (VITAMIN D3) 25 MCG (1000 UNIT) tablet, Take 1,000 Units by mouth daily., Disp: , Rfl:    cholestyramine light (PREVALITE) 4 g packet, Take 1 packet (4 g total) by mouth 2 (two) times daily., Disp: 180 each, Rfl: 3   famotidine (PEPCID) 40 MG tablet, TAKE 1 TABLET BY MOUTH EVERY DAY, Disp: 90 tablet, Rfl: 1   folic acid (FOLVITE) 1 MG tablet, TAKE 1 TABLET BY MOUTH EVERY DAY, Disp: 90 tablet, Rfl: 1   glucosamine-chondroitin 500-400 MG tablet, Take 1 tablet by mouth 2 (two) times daily., Disp: , Rfl:    hydroxyurea (HYDREA) 500 MG capsule, TAKE 1 CAPSULE BY MOUTH TWICE A DAY, Disp: 180 capsule, Rfl: 1   Multiple Vitamins-Minerals (MULTIVITAMIN ADULTS 50+) TABS, Take 1 tablet by mouth daily., Disp: , Rfl:    Multiple Vitamins-Minerals (PRESERVISION AREDS 2+MULTI VIT PO), Take 1 capsule by mouth 2 (two) times daily., Disp: , Rfl:    sucralfate (CARAFATE) 1 g tablet, Take 1 tablet (1 g total) by mouth 4 (four) times daily -  with meals and at bedtime. (Patient taking differently: Take 1 g by mouth at bedtime.), Disp: 360 tablet, Rfl: 3   metoprolol tartrate (LOPRESSOR) 25 MG tablet, TAKE 1/2 TABLET BY MOUTH 2 TIMES DAILY. (Patient not taking: Reported on 12/11/2022), Disp: 90 tablet, Rfl: 0  Allergies  Allergen Reactions   Aspirin Other (See Comments)    "  Due to intestinal issue"   Sulfa Antibiotics Other (See Comments)    bleeding    Objective:   BP 116/72   Pulse 68   Temp 98.5 F (36.9 C)   Ht  (1.676 m)   Wt 170 lb (77.1 kg) Comment: last documented weight - pt refused weight today  LMP  (LMP Unknown)   BMI 27.44 kg/m    Physical Exam Vitals reviewed.  Constitutional:      General: She is not in acute distress.    Appearance: Normal appearance. She is not ill-appearing, toxic-appearing or diaphoretic.  HENT:     Head: Normocephalic and atraumatic.      Right Ear: Tympanic membrane, ear canal and external ear normal. There is no impacted cerumen.     Left Ear: Tympanic membrane, ear canal and external ear normal. There is no impacted cerumen.     Nose: Nose normal. No congestion or rhinorrhea.     Right Turbinates: Swollen and pale.     Left Turbinates: Swollen and pale.     Right Sinus: No maxillary sinus tenderness or frontal sinus tenderness.     Left Sinus: No maxillary sinus tenderness or frontal sinus tenderness.     Mouth/Throat:     Mouth: Mucous membranes are moist.     Pharynx: Oropharynx is clear. No oropharyngeal exudate or posterior oropharyngeal erythema.  Eyes:     General: Lids are normal. Allergic shiner present. No scleral icterus.       Right eye: No discharge.        Left eye: No discharge.     Conjunctiva/sclera:     Right eye: Right conjunctiva is injected. Exudate present.  Cardiovascular:     Rate and Rhythm: Normal rate and regular rhythm.     Heart sounds: Normal heart sounds. No murmur heard.    No friction rub. No gallop.  Pulmonary:     Effort: Pulmonary effort is normal. No respiratory distress.     Breath sounds: Normal breath sounds. No stridor. No wheezing, rhonchi or rales.  Musculoskeletal:        General: Normal range of motion.     Cervical back: Normal range of motion.  Lymphadenopathy:     Cervical: No cervical adenopathy.  Skin:    General: Skin is warm and dry.     Capillary Refill: Capillary refill takes less than 2 seconds.  Neurological:     General: No focal deficit present.     Mental Status: She is alert and oriented to person, place, and time. Mental status is at baseline.  Psychiatric:        Mood and Affect: Mood normal.        Behavior: Behavior normal.        Thought Content: Thought content normal.        Judgment: Judgment normal.

## 2022-12-12 ENCOUNTER — Ambulatory Visit: Payer: No Typology Code available for payment source | Admitting: Gastroenterology

## 2022-12-12 ENCOUNTER — Telehealth: Payer: Self-pay | Admitting: Internal Medicine

## 2022-12-15 ENCOUNTER — Telehealth: Payer: Self-pay | Admitting: Internal Medicine

## 2022-12-15 ENCOUNTER — Other Ambulatory Visit: Payer: Self-pay

## 2022-12-15 ENCOUNTER — Other Ambulatory Visit: Payer: Self-pay | Admitting: Internal Medicine

## 2022-12-15 DIAGNOSIS — H1031 Unspecified acute conjunctivitis, right eye: Secondary | ICD-10-CM

## 2022-12-15 MED ORDER — HYDROCODONE BIT-HOMATROP MBR 5-1.5 MG/5ML PO SOLN: 5.0000 mL | Freq: Four times a day (QID) | ORAL | 0 refills | Status: AC | PRN

## 2022-12-15 MED ORDER — POLYMYXIN B-TRIMETHOPRIM 10000-0.1 UNIT/ML-% OP SOLN: 1.0000 [drp] | OPHTHALMIC | 0 refills | Status: AC

## 2022-12-15 NOTE — Telephone Encounter (Signed)
Please advise 

## 2022-12-15 NOTE — Telephone Encounter (Signed)
Done erx 

## 2022-12-15 NOTE — Telephone Encounter (Signed)
Ok done erx 

## 2022-12-15 NOTE — Telephone Encounter (Signed)
Pt called she wanted to know if Dr. Jonny Ruiz can prescribe some medication for cough syrup. Pt also stated that she is not getting any sleep from coughing so much pt was seen early this week. Please call pt with update.

## 2022-12-15 NOTE — Telephone Encounter (Signed)
Prescription Request  12/15/2022  LOV: 07/06/2022  What is the name of the medication or equipment?  trimethoprim-polymyxin b (POLYTRIM) ophthalmic solution   Have you contacted your pharmacy to request a refill? No   Which pharmacy would you like this sent to?  [id number] - [place], [place] - [address] AT [address] OF [facility] [address] [place] [address] Phone: [phone] Fax: [phone]    Patient notified that their request is being sent to the clinical staff for review and that they should receive a response within 2 business days.   Please advise at Mobile [phone] (mobile)

## 2022-12-25 ENCOUNTER — Ambulatory Visit: Payer: No Typology Code available for payment source | Admitting: Dermatology

## 2022-12-25 ENCOUNTER — Encounter: Payer: Self-pay | Admitting: Dermatology

## 2022-12-25 DIAGNOSIS — L906 Striae atrophicae: Secondary | ICD-10-CM

## 2022-12-25 DIAGNOSIS — Q828 Other specified congenital malformations of skin: Secondary | ICD-10-CM | POA: Diagnosis not present

## 2022-12-25 MED ORDER — TRETINOIN 0.05 % EX CREA: TOPICAL_CREAM | Freq: Every day | CUTANEOUS | 2 refills | Status: DC

## 2022-12-25 NOTE — Patient Instructions (Signed)
Due to recent changes in healthcare laws, you may see results of your pathology and/or laboratory studies on MyChart before the doctors have had a chance to review them. We understand that in some cases there may be results that are confusing or concerning to you. Please understand that not all results are received at the same time and often the doctors may need to interpret multiple results in order to provide you with the best plan of care or course of treatment. Therefore, we ask that you please give us 2 business days to thoroughly review all your results before contacting the office for clarification. Should we see a critical lab result, you will be contacted sooner.   If You Need Anything After Your Visit  If you have any questions or concerns for your doctor, please call our main line at [phone] If no one answers, please leave a voicemail as directed and we will return your call as soon as possible. Messages left after 4 pm will be answered the following business day.   You may also send us a message via MyChart. We typically respond to MyChart messages within 1-2 business days.  For prescription refills, please ask your pharmacy to contact our office. Our fax number is [phone].  If you have an urgent issue when the clinic is closed that cannot wait until the next business day, you can page your doctor at the number below.    Please note that while we do our best to be available for urgent issues outside of office hours, we are not available 24/7.   If you have an urgent issue and are unable to reach us, you may choose to seek medical care at your doctor's office, retail clinic, urgent care center, or emergency room.  If you have a medical emergency, please immediately call 911 or go to the emergency department. In the event of inclement weather, please call our main line at [phone] for an update on the status of any delays or closures.  Dermatology Medication Tips: Please  keep the boxes that topical medications come in in order to help keep track of the instructions about where and how to use these. Pharmacies typically print the medication instructions only on the boxes and not directly on the medication tubes.   If your medication is too expensive, please contact our office at [phone] or send us a message through MyChart.   We are unable to tell what your co-pay for medications will be in advance as this is different depending on your insurance coverage. However, we may be able to find a substitute medication at lower cost or fill out paperwork to get insurance to cover a needed medication.   If a prior authorization is required to get your medication covered by your insurance company, please allow us 1-2 business days to complete this process.  Drug prices often vary depending on where the prescription is filled and some pharmacies may offer cheaper prices.  The website www.goodrx.com contains coupons for medications through different pharmacies. The prices here do not account for what the cost may be with help from insurance (it may be cheaper with your insurance), but the website can give you the price if you did not use any insurance.  - You can print the associated coupon and take it with your prescription to the pharmacy.  - You may also stop by our office during regular business hours and pick up a GoodRx coupon card.  - If you need your   prescription sent electronically to a different pharmacy, notify our office through [facility] MyChart or by phone at [phone]     

## 2022-12-25 NOTE — Progress Notes (Signed)
   New Patient Visit   Subjective  Kristina Osborne is a 72 y.o. female who presents for the following: Rash  Patient has complained of a rash on the neck for a couple months now. It is bumpy. Denies pain or irritation. Nothing drains from the bumps. Has tried glycolic acid, regular soap and water, scrubbing, and moisturizing creams but nothing helps  Complains of a rash on the hips and back. It has been present for a year and a half. It comes and goes. Denies itchiness or irritation but it is scaly. Has tried scrubbing gloves. Has tried general moisturizers and glycolic pads. Nothing has helped.    The following portions of the chart were reviewed this encounter and updated as appropriate: medications, allergies, medical history  Review of Systems:  No other skin or systemic complaints except as noted in HPI or Assessment and Plan.  Objective  Well appearing patient in no apparent distress; mood and affect are within normal limits.  A focused examination was performed of the following areas: Neck and back  Relevant exam findings are noted in the Assessment and Plan.    Assessment & Plan   Striae Exam: linear atrophic plaques  Treatment Plan: -Tretinoin 0.05%. 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  Pt currently under the care of all necessary specialties.  -Reassurance. No specific treatments available for the skin findings at this time.    Return in about 3 months (around 03/26/2023) for Rash follow up.    Documentation: I have reviewed the above documentation for accuracy and completeness, and I agree with the above.  Langston Reusing, DO  I, Germaine Pomfret, CMA,  am acting as scribe for [employer], DO.

## 2022-12-28 ENCOUNTER — Inpatient Hospital Stay: Payer: No Typology Code available for payment source

## 2022-12-28 ENCOUNTER — Other Ambulatory Visit: Payer: Self-pay | Admitting: *Deleted

## 2022-12-28 ENCOUNTER — Other Ambulatory Visit: Payer: Self-pay

## 2022-12-28 DIAGNOSIS — D500 Iron deficiency anemia secondary to blood loss (chronic): Secondary | ICD-10-CM

## 2023-01-01 ENCOUNTER — Inpatient Hospital Stay: Payer: No Typology Code available for payment source

## 2023-01-02 ENCOUNTER — Other Ambulatory Visit: Payer: Self-pay | Admitting: Family Medicine

## 2023-01-02 ENCOUNTER — Telehealth: Payer: Self-pay | Admitting: Hematology and Oncology

## 2023-01-02 DIAGNOSIS — J302 Other seasonal allergic rhinitis: Secondary | ICD-10-CM

## 2023-01-03 ENCOUNTER — Other Ambulatory Visit: Payer: Self-pay

## 2023-01-03 ENCOUNTER — Telehealth: Payer: Self-pay | Admitting: Gastroenterology

## 2023-01-03 ENCOUNTER — Telehealth: Payer: Self-pay | Admitting: *Deleted

## 2023-01-03 ENCOUNTER — Other Ambulatory Visit: Payer: Self-pay | Admitting: *Deleted

## 2023-01-03 ENCOUNTER — Inpatient Hospital Stay: Payer: No Typology Code available for payment source | Attending: Hematology and Oncology

## 2023-01-03 ENCOUNTER — Other Ambulatory Visit: Payer: Self-pay | Admitting: Physician Assistant

## 2023-01-03 DIAGNOSIS — D473 Essential (hemorrhagic) thrombocythemia: Secondary | ICD-10-CM | POA: Insufficient documentation

## 2023-01-03 DIAGNOSIS — K5520 Angiodysplasia of colon without hemorrhage: Secondary | ICD-10-CM

## 2023-01-03 DIAGNOSIS — D509 Iron deficiency anemia, unspecified: Secondary | ICD-10-CM | POA: Diagnosis present

## 2023-01-03 DIAGNOSIS — D500 Iron deficiency anemia secondary to blood loss (chronic): Secondary | ICD-10-CM

## 2023-01-03 DIAGNOSIS — Z79899 Other long term (current) drug therapy: Secondary | ICD-10-CM | POA: Insufficient documentation

## 2023-01-03 LAB — CBC WITH DIFFERENTIAL (CANCER CENTER ONLY)
Abs Immature Granulocytes: 0.03 10*3/uL (ref 0.00–0.07)
Basophils Absolute: 0.1 10*3/uL (ref 0.0–0.1)
Basophils Relative: 1 %
Eosinophils Absolute: 0 10*3/uL (ref 0.0–0.5)
Eosinophils Relative: 0 %
HCT: 31 % — ABNORMAL LOW (ref 36.0–46.0)
Hemoglobin: 10.3 g/dL — ABNORMAL LOW (ref 12.0–15.0)
Immature Granulocytes: 0 %
Lymphocytes Relative: 10 %
Lymphs Abs: 0.7 10*3/uL (ref 0.7–4.0)
MCH: 36.9 pg — ABNORMAL HIGH (ref 26.0–34.0)
MCHC: 33.2 g/dL (ref 30.0–36.0)
MCV: 111.1 fL — ABNORMAL HIGH (ref 80.0–100.0)
Monocytes Absolute: 0.6 10*3/uL (ref 0.1–1.0)
Monocytes Relative: 8 %
Neutro Abs: 6.1 10*3/uL (ref 1.7–7.7)
Neutrophils Relative %: 81 %
Platelet Count: 504 10*3/uL — ABNORMAL HIGH (ref 150–400)
RBC: 2.79 MIL/uL — ABNORMAL LOW (ref 3.87–5.11)
RDW: 15.4 % (ref 11.5–15.5)
WBC Count: 7.5 10*3/uL (ref 4.0–10.5)
nRBC: 0 % (ref 0.0–0.2)

## 2023-01-03 LAB — RETIC PANEL
Immature Retic Fract: 22.2 % — ABNORMAL HIGH (ref 2.3–15.9)
RBC.: 2.85 MIL/uL — ABNORMAL LOW (ref 3.87–5.11)
Retic Count, Absolute: 88.5 10*3/uL (ref 19.0–186.0)
Retic Ct Pct: 3.1 % (ref 0.4–3.1)
Reticulocyte Hemoglobin: 29.6 pg (ref >27.9)

## 2023-01-03 LAB — IRON AND IRON BINDING CAPACITY (CC-WL,HP ONLY)
Iron: 34 ug/dL (ref 28–170)
Saturation Ratios: 10 % — ABNORMAL LOW (ref 10.4–31.8)
TIBC: 339 ug/dL (ref 250–450)
UIBC: 305 ug/dL (ref 148–442)

## 2023-01-03 LAB — CMP (CANCER CENTER ONLY)
ALT: 17 U/L (ref 0–44)
AST: 23 U/L (ref 15–41)
Albumin: 4.2 g/dL (ref 3.5–5.0)
Alkaline Phosphatase: 57 U/L (ref 38–126)
Anion gap: 4 — ABNORMAL LOW (ref 5–15)
BUN: 15 mg/dL (ref 8–23)
CO2: 28 mmol/L (ref 22–32)
Calcium: 9.4 mg/dL (ref 8.9–10.3)
Chloride: 105 mmol/L (ref 98–111)
Creatinine: 0.71 mg/dL (ref 0.44–1.00)
GFR, Estimated: >60 mL/min (ref >60)
Glucose, Bld: 94 mg/dL (ref 70–99)
Potassium: 4.2 mmol/L (ref 3.5–5.1)
Sodium: 137 mmol/L (ref 135–145)
Total Bilirubin: 0.3 mg/dL (ref 0.3–1.2)
Total Protein: 6.5 g/dL (ref 6.5–8.1)

## 2023-01-03 LAB — FERRITIN: Ferritin: 30 ng/mL (ref 11–307)

## 2023-01-03 LAB — SAMPLE TO BLOOD BANK

## 2023-01-03 MED ORDER — SUCRALFATE 1 G PO TABS: 1.0000 g | ORAL_TABLET | Freq: Three times a day (TID) | ORAL | 1 refills | Status: DC

## 2023-01-03 NOTE — Telephone Encounter (Signed)
TCT patient after her labs resulted today. Spoke with her. Advised that her HGB and iron levels had dropped some in the lst month. Pt states she did have lower GI bleeding in the last 2 days  Advised that I discussed her situation with Georga Kaufmann, PA and she agreed that IV iron was appropriate and orders were placed for this for the [facility]. Pt voiced understanding and is grateful for this.  No other questions or concerns at this time.  She verbalizes understanding to call with any gross GI bleeding.

## 2023-01-03 NOTE — Telephone Encounter (Signed)
Prescription sent to patient's pharmacy.

## 2023-01-03 NOTE — Telephone Encounter (Signed)
Patient called requesting that a refill for her Sucralfate be sent in for a refill at [facility] [address]. States she only has a few left. Please advise, thank you.

## 2023-01-09 ENCOUNTER — Telehealth: Payer: Self-pay | Admitting: Pharmacy Technician

## 2023-01-09 NOTE — Telephone Encounter (Signed)
FYI NOTE:   Auth Submission: APPROVED Site of care: Site of care: [facility] Payer: [employer] Medication & CPT/J Code(s) submitted: Monoferric (Ferrci derisomaltose) [phone] Route of submission (phone, fax, portal):  Phone # Fax # Auth type: Buy/Bill Units/visits requested: 1 DOSE Reference number: [id number] Approval from: 01/03/23 to 07/05/23   Patient will be scheduled as soon as possible  Co-pay: approved

## 2023-01-13 ENCOUNTER — Other Ambulatory Visit: Payer: Self-pay | Admitting: Internal Medicine

## 2023-01-16 ENCOUNTER — Ambulatory Visit: Payer: No Typology Code available for payment source | Admitting: Gastroenterology

## 2023-01-17 ENCOUNTER — Ambulatory Visit (INDEPENDENT_AMBULATORY_CARE_PROVIDER_SITE_OTHER): Payer: No Typology Code available for payment source

## 2023-01-17 VITALS — BP 98/59 | HR 67 | Temp 98.1°F | Resp 16 | Ht 66.0 in | Wt 180.0 lb

## 2023-01-17 DIAGNOSIS — K5520 Angiodysplasia of colon without hemorrhage: Secondary | ICD-10-CM

## 2023-01-17 DIAGNOSIS — D500 Iron deficiency anemia secondary to blood loss (chronic): Secondary | ICD-10-CM | POA: Diagnosis not present

## 2023-01-17 MED ORDER — SODIUM CHLORIDE 0.9 % IV SOLN
1000.0000 mg | Freq: Once | INTRAVENOUS | Status: AC
  Administered 2023-01-17: 1000 mg via INTRAVENOUS
  Filled 2023-01-17: qty 10

## 2023-01-17 NOTE — Progress Notes (Signed)
Diagnosis: Iron Deficiency Anemia  Provider:  Chilton Greathouse MD  Procedure: IV Infusion  IV Type: Peripheral, IV Location: R Forearm  Feraheme (Ferumoxytol), Dose: 510 mg  Infusion Start Time: 1108  Infusion Stop Time: 1134  Post Infusion IV Care: Peripheral IV Discontinued  Discharge: Condition: Good, Destination: Home . AVS Declined  Performed by:  Marilynn Rail, RN

## 2023-01-24 ENCOUNTER — Other Ambulatory Visit: Payer: Self-pay | Admitting: Hematology and Oncology

## 2023-02-01 ENCOUNTER — Inpatient Hospital Stay: Payer: No Typology Code available for payment source

## 2023-02-01 ENCOUNTER — Inpatient Hospital Stay: Payer: No Typology Code available for payment source | Admitting: Hematology and Oncology

## 2023-02-01 ENCOUNTER — Telehealth: Payer: Self-pay | Admitting: Hematology and Oncology

## 2023-02-01 NOTE — Telephone Encounter (Signed)
Patient is aware of upcoming appointments. °

## 2023-02-01 NOTE — Progress Notes (Signed)
Rescheduled

## 2023-02-04 ENCOUNTER — Encounter: Payer: Self-pay | Admitting: Physician Assistant

## 2023-02-10 ENCOUNTER — Other Ambulatory Visit: Payer: Self-pay | Admitting: Internal Medicine

## 2023-02-25 NOTE — Progress Notes (Unsigned)
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 Leonides Schanz  10/26/2021: WBC 6.7, Hgb 13.3, MCV 121.7, Plt 204 03/10/2022: WBC 3.7, Hgb 11.2, MCV 120, Plt 261. Decrease hydroxyurea to 1000 mg BID due to cytopenias.  06/24/2022-06/27/2022: Admitted with worsening GI bleed. Hydroxyurea held.  08/01/2022: WBC 4.4, hemoglobin 9.8, MCV 105.1, and platelets of 190 02/26/2023: WBC 7.3, Hgb 12.5, MCV 109.1, Plt 807. Increase hydroxyurea to 1000 mg daily with 1500 mg QOD  Interval History:  Kristina Osborne 72 y.o. female with medical history significant for essential thrombocytosis who presents for a follow up visit. The patient's last visit was on 11/29/2022. In the interim since the last visit [name] has continued taking hydroxyurea 500 mg PO BID.   On exam today Kristina Osborne reports she has been feeling good overall in the interim since her last visit.  She continues taking her hydroxyurea 500 mg twice daily.  She notes that she is not currently on any blood thinner therapy due to her AVMs.  She notes no overt signs of bleeding, bruising, or dark stools.  Overall she is willing and able to increase her dose of hydroxyurea in order to help meet her platelet goals.  She is disheartened by the increase in platelets but understands this is due to the fact we held the medication to allow her hemoglobin to recover.  She has not had any recent illnesses.  She denies any fevers, chills, sweats, nausea, vomiting or diarrhea.  A full 10 point ROS is listed below.   MEDICAL HISTORY:  Past Medical  History:  Diagnosis Date   ABLA (acute blood loss anemia) 04/16/2022   Congenital malformation of intestinal fixation    Gastritis    Gastroesophageal reflux disease without esophagitis 09/10/2021   IDA (iron deficiency anemia)    Mitral valve prolapse    Thrombocytosis     SURGICAL HISTORY: Past Surgical History:  Procedure Laterality Date   BIOPSY  06/26/2022   Procedure: BIOPSY;  Surgeon: Jenel Lucks, MD;  Location: [facility];  Service: Gastroenterology;;   COLONOSCOPY N/A 04/17/2022   Procedure: COLONOSCOPY;  Surgeon: Beverley Fiedler, MD;  Location: [facility];  Service: Gastroenterology;  Laterality: N/A;   COLONOSCOPY WITH PROPOFOL N/A 06/26/2022   Procedure: COLONOSCOPY WITH PROPOFOL;  Surgeon: Jenel Lucks, MD;  Location: [facility];  Service: Gastroenterology;  Laterality: N/A;   ENTEROSCOPY N/A 04/17/2022   Procedure: ENTEROSCOPY;  Surgeon: Beverley Fiedler, MD;  Location: [facility];  Service: Gastroenterology;  Laterality: N/A;   ENTEROSCOPY N/A 06/26/2022   Procedure: ENTEROSCOPY;  Surgeon: Jenel Lucks, MD;  Location: [facility];  Service: Gastroenterology;  Laterality: N/A;   GIVENS CAPSULE STUDY N/A 06/26/2022   Procedure: GIVENS CAPSULE STUDY;  Surgeon: Jenel Lucks, MD;  Location: [facility];  Service: Gastroenterology;  Laterality: N/A;   HEMOSTASIS CLIP PLACEMENT  04/17/2022   Procedure: HEMOSTASIS CLIP PLACEMENT;  Surgeon: Beverley Fiedler, MD;  Location: [facility];  Service: Gastroenterology;;   HEMOSTASIS CLIP PLACEMENT  06/26/2022   Procedure: HEMOSTASIS CLIP PLACEMENT;  Surgeon: Jenel Lucks, MD;  Location: [facility];  Service: Gastroenterology;;   HOT HEMOSTASIS N/A 04/17/2022   Procedure: HOT HEMOSTASIS (ARGON PLASMA COAGULATION/BICAP);  Surgeon: Beverley Fiedler, MD;  Location: [facility];  Service: Gastroenterology;  Laterality: N/A;   HOT HEMOSTASIS N/A 06/26/2022   Procedure: HOT HEMOSTASIS (ARGON PLASMA  COAGULATION/BICAP);  Surgeon: Jenel Lucks, MD;  Location: [facility];  Service: Gastroenterology;  Laterality: N/A;   POLYPECTOMY  04/17/2022   Procedure: POLYPECTOMY;  Surgeon: Beverley Fiedler, MD;  Location: [facility];  Service: Gastroenterology;;   POLYPECTOMY  06/26/2022   Procedure: POLYPECTOMY;  Surgeon: Jenel Lucks, MD;  Location: [facility];  Service: Gastroenterology;;   SMALL INTESTINE SURGERY  1994   SUBMUCOSAL TATTOO INJECTION  06/26/2022   Procedure: SUBMUCOSAL TATTOO INJECTION;  Surgeon: Jenel Lucks, MD;  Location: [facility];  Service: Gastroenterology;;    SOCIAL HISTORY: Social History   Socioeconomic History   Marital status: Married    Spouse name: Not on file   Number of children: 0   Years of education: Not on file   Highest education level: Not on file  Occupational History   Not on file  Tobacco Use   Smoking status: Former    Years: 15    Types: Cigarettes   Smokeless tobacco: Never  Vaping Use   Vaping Use: Never used  Substance and Sexual Activity   Alcohol use: Not Currently   Drug use: Never   Sexual activity: Yes    Partners: Male  Other Topics Concern   Not on file  Social History Narrative   Not on file   Social Determinants of Health   Financial Resource Strain: Not on file  Food Insecurity: No Food Insecurity (06/24/2022)   Hunger Vital Sign    Worried About Running Out of Food in the Last Year: Never true    Ran Out of Food in the Last Year: Never true  Transportation Needs: No Transportation Needs (06/24/2022)   PRAPARE - Administrator, Civil Service (Medical): No    Lack of Transportation (Non-Medical): No  Physical Activity: Not on file  Stress: Not on file  Social Connections: Not on file  Intimate Partner Violence: Not At Risk (06/24/2022)   Humiliation, Afraid, Rape, and Kick questionnaire    Fear of Current or Ex-Partner: No    Emotionally Abused: No    Physically Abused: No     Sexually Abused: No    FAMILY HISTORY: Family History  Problem Relation Age of Onset   Heart disease Mother    Valvular heart disease Mother    Healthy Sister    Alcoholism Half-Sister     ALLERGIES:  is allergic to aspirin and sulfa antibiotics.  MEDICATIONS:  Current Outpatient Medications  Medication Sig Dispense Refill   acetaminophen (TYLENOL) 500 MG tablet Take 1,000 mg by mouth every 6 (six) hours as needed for mild pain or headache.     Ascorbic Acid (VITAMIN C) 1000 MG tablet Take 500 mg by mouth in the morning and at bedtime.     chlorhexidine (PERIDEX) 0.12 % solution 5 mLs by Mouth Rinse route as needed.     cholecalciferol (VITAMIN D3) 25 MCG (1000 UNIT) tablet Take 1,000 Units by mouth daily.     cholestyramine light (PREVALITE) 4 g packet Take 1 packet (4 g total) by mouth 2 (two) times daily. 180 each 3   famotidine (PEPCID) 40 MG tablet TAKE 1 TABLET BY MOUTH EVERY DAY 90 tablet 1   folic acid (FOLVITE) 1  MG tablet TAKE 1 TABLET BY MOUTH EVERY DAY 90 tablet 1   glucosamine-chondroitin 500-400 MG tablet Take 1 tablet by mouth 2 (two) times daily.     hydroxyurea (HYDREA) 500 MG capsule TAKE 1 CAPSULE BY MOUTH TWICE A DAY 180 capsule 1   metoprolol tartrate (LOPRESSOR) 25 MG tablet Take 0.5 tablets (12.5 mg total) by mouth 2 (two) times daily. NEED OV. 90 tablet 0   Multiple Vitamins-Minerals (MULTIVITAMIN ADULTS 50+) TABS Take 1 tablet by mouth daily.     Multiple Vitamins-Minerals (PRESERVISION AREDS 2+MULTI VIT PO) Take 1 capsule by mouth 2 (two) times daily.     sucralfate (CARAFATE) 1 g tablet Take 1 tablet (1 g total) by mouth 4 (four) times daily -  with meals and at bedtime. 360 tablet 1   tretinoin (RETIN-A) 0.05 % cream Apply topically at bedtime. Apply a pea sized amount mixed with a quarter sized amount to the affected area three nights a week 45 g 2   No current facility-administered medications for this visit.    REVIEW OF SYSTEMS:   Constitutional: (  - ) fevers, ( - )  chills , ( - ) night sweats Eyes: ( - ) blurriness of vision, ( - ) double vision, ( - ) watery eyes Ears, nose, mouth, throat, and face: ( - ) mucositis, ( - ) sore throat Respiratory: ( - ) cough, ( - ) dyspnea, ( - ) wheezes Cardiovascular: ( - ) palpitation, ( - ) chest discomfort, ( - ) lower extremity swelling Gastrointestinal:  ( - ) nausea, ( - ) heartburn, ( - ) change in bowel habits Skin: ( - ) abnormal skin rashes Lymphatics: ( - ) new lymphadenopathy, ( - ) easy bruising Neurological: ( - ) numbness, ( - ) tingling, ( - ) new weaknesses Behavioral/Psych: ( - ) mood change, ( - ) new changes  All other systems were reviewed with the patient and are negative.  PHYSICAL EXAMINATION:  Vitals:   02/26/23 1508  BP: 124/76  Pulse: 79  Resp: 15  Temp: 97.8 F (36.6 C)  SpO2: 100%    GENERAL: well appearing elderly Caucasian female, in NAD  SKIN: skin color, texture, turgor are normal, no rashes or significant lesions EYES: conjunctiva are pink and non-injected, sclera clear LUNGS: clear to auscultation and percussion with normal breathing effort HEART: regular rate & rhythm and no murmurs and no lower extremity edema Musculoskeletal: no cyanosis of digits and no clubbing  PSYCH: alert & oriented x 3, fluent speech NEURO: no focal motor/sensory deficits   LABORATORY DATA:  I have reviewed the data as listed    Latest Ref Rng & Units 02/26/2023    2:31 PM 01/03/2023   11:39 AM 11/29/2022   11:04 AM  CBC  WBC 4.0 - 10.5 K/uL 7.3  7.5  4.3   Hemoglobin 12.0 - 15.0 g/dL 08.6  57.8  46.9   Hematocrit 36.0 - 46.0 % 37.1  31.0  34.5   Platelets 150 - 400 K/uL 807  504  566        Latest Ref Rng & Units 02/26/2023    2:31 PM 01/03/2023   11:39 AM 11/26/2022    6:08 PM  CMP  Glucose 70 - 99 mg/dL 629  94  528   BUN 8 - 23 mg/dL 10  15  13    Creatinine 0.44 - 1.00 mg/dL 4.13  2.44  0.10   Sodium 135 - 145 mmol/L 141  137  141   Potassium 3.5 - 5.1 mmol/L  3.6  4.2  3.6   Chloride 98 - 111 mmol/L 106  105  105   CO2 22 - 32 mmol/L 28  28  27    Calcium 8.9 - 10.3 mg/dL 9.5  9.4  40.9   Total Protein 6.5 - 8.1 g/dL 7.2  6.5  7.2   Total Bilirubin 0.3 - 1.2 mg/dL 0.3  0.3  0.4   Alkaline Phos 38 - 126 U/L 59  57  55   AST 15 - 41 U/L 27  23  29    ALT 0 - 44 U/L 20  17  20     RADIOGRAPHIC STUDIES: No results found.  ASSESSMENT & PLAN Kristina Osborne 72 y.o. female with medical history significant for essential thrombocytosis who presents for a follow up visit.   After review of the labs, review of the records, and discussion with the patient the patients findings are most consistent with thrombocytosis, reported due to essential thrombocythemia. We have received the records showing that she has a JAK2 mutation.  Additionally she will require a bone marrow biopsy in order to assure that essential thrombocythemia is the diagnosis.  In the interim we will have the patient continue her hydroxyurea as previously prescribed by her provider in [place].  # Essential thrombocythemia, JAK2 Positive  -- At this time we will need to confirm that the patient has essential thrombocythemia.  She will require a bone marrow biopsy to confirm this diagnosis --JAK2 testing received from patient's prior hematologist. Confirmed JAK2 mutation. Plan:   -- If and when the patient is agreeable we will schedule her for a bone marrow biopsy. --Hydroxyurea was previously held due to GI bleed and anemia.  Anemia is proving with IV iron therapy and therefore we can cotinue hydroxyurea at this time. --increase to 1500 mg hydroxyurea alternating with 1000 mg every other day (original dose, now up from 1000 mg BID).  -- Labs today show WBC 7.3, Hgb 12.5, MCV 109.1, Plt 807 --continue with labs q 2 weekly followed by a clinic visit in 8 weeks.  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   --Patient has history of GI bleeding. Patient notes no active maroon/red/black stools.  Appreciate assistance of GI in the management of this case. --Labs today as noted above.  --continue to follow with q 4 weekly labs   No orders of the defined types were placed in this encounter.   All questions were answered. The patient knows to call the clinic with any problems, questions or concerns.  A total of more than 30 minutes were spent on this encounter with face-to-face time and non-face-to-face time, including preparing to see the patient, ordering tests and/or medications, counseling the patient and coordination of care as outlined above.   Ulysees Barns, MD Department of Hematology/Oncology [facility] Phone: [phone] Pager: [phone] Email: [email or web address]  02/26/2023 3:46 PM

## 2023-02-26 ENCOUNTER — Other Ambulatory Visit: Payer: Self-pay | Admitting: *Deleted

## 2023-02-26 ENCOUNTER — Other Ambulatory Visit: Payer: Self-pay

## 2023-02-26 ENCOUNTER — Inpatient Hospital Stay: Payer: No Typology Code available for payment source | Attending: Hematology and Oncology

## 2023-02-26 ENCOUNTER — Inpatient Hospital Stay (HOSPITAL_BASED_OUTPATIENT_CLINIC_OR_DEPARTMENT_OTHER): Payer: No Typology Code available for payment source | Admitting: Hematology and Oncology

## 2023-02-26 VITALS — BP 124/76 | HR 79 | Temp 97.8°F | Resp 15

## 2023-02-26 DIAGNOSIS — D473 Essential (hemorrhagic) thrombocythemia: Secondary | ICD-10-CM

## 2023-02-26 DIAGNOSIS — K5520 Angiodysplasia of colon without hemorrhage: Secondary | ICD-10-CM | POA: Diagnosis not present

## 2023-02-26 DIAGNOSIS — D500 Iron deficiency anemia secondary to blood loss (chronic): Secondary | ICD-10-CM

## 2023-02-26 DIAGNOSIS — Z79899 Other long term (current) drug therapy: Secondary | ICD-10-CM | POA: Diagnosis not present

## 2023-02-26 DIAGNOSIS — D509 Iron deficiency anemia, unspecified: Secondary | ICD-10-CM | POA: Diagnosis present

## 2023-02-26 DIAGNOSIS — Z87891 Personal history of nicotine dependence: Secondary | ICD-10-CM | POA: Diagnosis not present

## 2023-02-26 LAB — CMP (CANCER CENTER ONLY)
ALT: 20 U/L (ref 0–44)
AST: 27 U/L (ref 15–41)
Albumin: 4.4 g/dL (ref 3.5–5.0)
Alkaline Phosphatase: 59 U/L (ref 38–126)
Anion gap: 7 (ref 5–15)
BUN: 10 mg/dL (ref 8–23)
CO2: 28 mmol/L (ref 22–32)
Calcium: 9.5 mg/dL (ref 8.9–10.3)
Chloride: 106 mmol/L (ref 98–111)
Creatinine: 0.73 mg/dL (ref 0.44–1.00)
GFR, Estimated: >60 mL/min (ref >60)
Glucose, Bld: 111 mg/dL — ABNORMAL HIGH (ref 70–99)
Potassium: 3.6 mmol/L (ref 3.5–5.1)
Sodium: 141 mmol/L (ref 135–145)
Total Bilirubin: 0.3 mg/dL (ref 0.3–1.2)
Total Protein: 7.2 g/dL (ref 6.5–8.1)

## 2023-02-26 LAB — CBC WITH DIFFERENTIAL (CANCER CENTER ONLY)
Abs Immature Granulocytes: 0.02 10*3/uL (ref 0.00–0.07)
Basophils Absolute: 0.1 10*3/uL (ref 0.0–0.1)
Basophils Relative: 1 %
Eosinophils Absolute: 0 10*3/uL (ref 0.0–0.5)
Eosinophils Relative: 0 %
HCT: 37.1 % (ref 36.0–46.0)
Hemoglobin: 12.5 g/dL (ref 12.0–15.0)
Immature Granulocytes: 0 %
Lymphocytes Relative: 11 %
Lymphs Abs: 0.8 10*3/uL (ref 0.7–4.0)
MCH: 36.8 pg — ABNORMAL HIGH (ref 26.0–34.0)
MCHC: 33.7 g/dL (ref 30.0–36.0)
MCV: 109.1 fL — ABNORMAL HIGH (ref 80.0–100.0)
Monocytes Absolute: 0.5 10*3/uL (ref 0.1–1.0)
Monocytes Relative: 7 %
Neutro Abs: 5.8 10*3/uL (ref 1.7–7.7)
Neutrophils Relative %: 81 %
Platelet Count: 807 10*3/uL — ABNORMAL HIGH (ref 150–400)
RBC: 3.4 MIL/uL — ABNORMAL LOW (ref 3.87–5.11)
RDW: 15.9 % — ABNORMAL HIGH (ref 11.5–15.5)
WBC Count: 7.3 10*3/uL (ref 4.0–10.5)
nRBC: 0 % (ref 0.0–0.2)

## 2023-02-26 LAB — FERRITIN: Ferritin: 70 ng/mL (ref 11–307)

## 2023-02-26 LAB — RETIC PANEL
Immature Retic Fract: 27.7 % — ABNORMAL HIGH (ref 2.3–15.9)
RBC.: 3.41 MIL/uL — ABNORMAL LOW (ref 3.87–5.11)
Retic Count, Absolute: 70.9 10*3/uL (ref 19.0–186.0)
Retic Ct Pct: 2.1 % (ref 0.4–3.1)
Reticulocyte Hemoglobin: 36.5 pg (ref >27.9)

## 2023-02-26 LAB — IRON AND IRON BINDING CAPACITY (CC-WL,HP ONLY)
Iron: 74 ug/dL (ref 28–170)
Saturation Ratios: 23 % (ref 10.4–31.8)
TIBC: 318 ug/dL (ref 250–450)
UIBC: 244 ug/dL (ref 148–442)

## 2023-03-12 ENCOUNTER — Other Ambulatory Visit: Payer: Self-pay

## 2023-03-12 ENCOUNTER — Other Ambulatory Visit: Payer: Self-pay | Admitting: Hematology and Oncology

## 2023-03-12 ENCOUNTER — Inpatient Hospital Stay: Payer: No Typology Code available for payment source

## 2023-03-12 DIAGNOSIS — D473 Essential (hemorrhagic) thrombocythemia: Secondary | ICD-10-CM

## 2023-03-12 DIAGNOSIS — D500 Iron deficiency anemia secondary to blood loss (chronic): Secondary | ICD-10-CM

## 2023-03-12 LAB — CBC WITH DIFFERENTIAL (CANCER CENTER ONLY)
Abs Immature Granulocytes: 0.02 10*3/uL (ref 0.00–0.07)
Basophils Absolute: 0.1 10*3/uL (ref 0.0–0.1)
Basophils Relative: 1 %
Eosinophils Absolute: 0 10*3/uL (ref 0.0–0.5)
Eosinophils Relative: 0 %
HCT: 38.3 % (ref 36.0–46.0)
Hemoglobin: 12.8 g/dL (ref 12.0–15.0)
Immature Granulocytes: 0 %
Lymphocytes Relative: 12 %
Lymphs Abs: 0.8 10*3/uL (ref 0.7–4.0)
MCH: 36.1 pg — ABNORMAL HIGH (ref 26.0–34.0)
MCHC: 33.4 g/dL (ref 30.0–36.0)
MCV: 107.9 fL — ABNORMAL HIGH (ref 80.0–100.0)
Monocytes Absolute: 0.4 10*3/uL (ref 0.1–1.0)
Monocytes Relative: 6 %
Neutro Abs: 5.4 10*3/uL (ref 1.7–7.7)
Neutrophils Relative %: 81 %
Platelet Count: 564 10*3/uL — ABNORMAL HIGH (ref 150–400)
RBC: 3.55 MIL/uL — ABNORMAL LOW (ref 3.87–5.11)
RDW: 15.8 % — ABNORMAL HIGH (ref 11.5–15.5)
WBC Count: 6.7 10*3/uL (ref 4.0–10.5)
nRBC: 0 % (ref 0.0–0.2)

## 2023-03-12 LAB — CMP (CANCER CENTER ONLY)
ALT: 20 U/L (ref 0–44)
AST: 25 U/L (ref 15–41)
Albumin: 4.5 g/dL (ref 3.5–5.0)
Alkaline Phosphatase: 60 U/L (ref 38–126)
Anion gap: 6 (ref 5–15)
BUN: 12 mg/dL (ref 8–23)
CO2: 28 mmol/L (ref 22–32)
Calcium: 10.3 mg/dL (ref 8.9–10.3)
Chloride: 104 mmol/L (ref 98–111)
Creatinine: 0.81 mg/dL (ref 0.44–1.00)
GFR, Estimated: >60 mL/min (ref >60)
Glucose, Bld: 94 mg/dL (ref 70–99)
Potassium: 4.6 mmol/L (ref 3.5–5.1)
Sodium: 138 mmol/L (ref 135–145)
Total Bilirubin: 0.4 mg/dL (ref 0.3–1.2)
Total Protein: 7 g/dL (ref 6.5–8.1)

## 2023-03-12 LAB — FERRITIN: Ferritin: 43 ng/mL (ref 11–307)

## 2023-03-12 LAB — RETIC PANEL
Immature Retic Fract: 24.1 % — ABNORMAL HIGH (ref 2.3–15.9)
RBC.: 3.53 MIL/uL — ABNORMAL LOW (ref 3.87–5.11)
Retic Count, Absolute: 74.8 10*3/uL (ref 19.0–186.0)
Retic Ct Pct: 2.1 % (ref 0.4–3.1)
Reticulocyte Hemoglobin: 37.7 pg (ref >27.9)

## 2023-03-12 LAB — IRON AND IRON BINDING CAPACITY (CC-WL,HP ONLY)
Iron: 48 ug/dL (ref 28–170)
Saturation Ratios: 15 % (ref 10.4–31.8)
TIBC: 326 ug/dL (ref 250–450)
UIBC: 278 ug/dL (ref 148–442)

## 2023-03-26 ENCOUNTER — Ambulatory Visit: Payer: No Typology Code available for payment source | Admitting: Dermatology

## 2023-03-28 ENCOUNTER — Inpatient Hospital Stay: Payer: No Typology Code available for payment source

## 2023-03-28 ENCOUNTER — Telehealth: Payer: Self-pay | Admitting: Hematology and Oncology

## 2023-03-30 ENCOUNTER — Inpatient Hospital Stay: Payer: Medicare Other

## 2023-04-04 ENCOUNTER — Inpatient Hospital Stay: Payer: Medicare Other | Attending: Hematology and Oncology

## 2023-04-04 ENCOUNTER — Other Ambulatory Visit: Payer: Self-pay

## 2023-04-04 ENCOUNTER — Encounter: Payer: Self-pay | Admitting: Physician Assistant

## 2023-04-04 DIAGNOSIS — D509 Iron deficiency anemia, unspecified: Secondary | ICD-10-CM | POA: Diagnosis present

## 2023-04-04 DIAGNOSIS — Z87891 Personal history of nicotine dependence: Secondary | ICD-10-CM | POA: Diagnosis not present

## 2023-04-04 DIAGNOSIS — D473 Essential (hemorrhagic) thrombocythemia: Secondary | ICD-10-CM | POA: Diagnosis present

## 2023-04-04 DIAGNOSIS — D500 Iron deficiency anemia secondary to blood loss (chronic): Secondary | ICD-10-CM

## 2023-04-04 DIAGNOSIS — Z79899 Other long term (current) drug therapy: Secondary | ICD-10-CM | POA: Diagnosis not present

## 2023-04-04 LAB — CMP (CANCER CENTER ONLY)
ALT: 18 U/L (ref 0–44)
AST: 24 U/L (ref 15–41)
Albumin: 4.5 g/dL (ref 3.5–5.0)
Alkaline Phosphatase: 60 U/L (ref 38–126)
Anion gap: 5 (ref 5–15)
BUN: 13 mg/dL (ref 8–23)
CO2: 30 mmol/L (ref 22–32)
Calcium: 9.9 mg/dL (ref 8.9–10.3)
Chloride: 103 mmol/L (ref 98–111)
Creatinine: 0.73 mg/dL (ref 0.44–1.00)
GFR, Estimated: >60 mL/min (ref >60)
Glucose, Bld: 91 mg/dL (ref 70–99)
Potassium: 4.5 mmol/L (ref 3.5–5.1)
Sodium: 138 mmol/L (ref 135–145)
Total Bilirubin: 0.4 mg/dL (ref 0.3–1.2)
Total Protein: 7 g/dL (ref 6.5–8.1)

## 2023-04-04 LAB — CBC WITH DIFFERENTIAL (CANCER CENTER ONLY)
Abs Immature Granulocytes: 0.02 10*3/uL (ref 0.00–0.07)
Basophils Absolute: 0 10*3/uL (ref 0.0–0.1)
Basophils Relative: 0 %
Eosinophils Absolute: 0 10*3/uL (ref 0.0–0.5)
Eosinophils Relative: 0 %
HCT: 37.6 % (ref 36.0–46.0)
Hemoglobin: 12.6 g/dL (ref 12.0–15.0)
Immature Granulocytes: 0 %
Lymphocytes Relative: 15 %
Lymphs Abs: 0.7 10*3/uL (ref 0.7–4.0)
MCH: 37 pg — ABNORMAL HIGH (ref 26.0–34.0)
MCHC: 33.5 g/dL (ref 30.0–36.0)
MCV: 110.3 fL — ABNORMAL HIGH (ref 80.0–100.0)
Monocytes Absolute: 0.4 10*3/uL (ref 0.1–1.0)
Monocytes Relative: 9 %
Neutro Abs: 3.5 10*3/uL (ref 1.7–7.7)
Neutrophils Relative %: 76 %
Platelet Count: 438 10*3/uL — ABNORMAL HIGH (ref 150–400)
RBC: 3.41 MIL/uL — ABNORMAL LOW (ref 3.87–5.11)
RDW: 16.2 % — ABNORMAL HIGH (ref 11.5–15.5)
WBC Count: 4.6 10*3/uL (ref 4.0–10.5)
nRBC: 0 % (ref 0.0–0.2)

## 2023-04-04 LAB — FERRITIN: Ferritin: 33 ng/mL (ref 11–307)

## 2023-04-05 ENCOUNTER — Telehealth: Payer: Self-pay | Admitting: *Deleted

## 2023-04-05 NOTE — Telephone Encounter (Signed)
-----   Message from Ulysees Barns IV sent at 04/05/2023  8:33 AM EDT ----- Please let Kristina Osborne know that her Plt are closer to target, currently at 438. Her Hgb is strong above 12 and iron stores appear stable. Overall these are good results. We will see her as scheduled on 04/20/2023. ----- Message ----- From: Leory Plowman, Lab In [place] Sent: 04/04/2023  11:14 AM EDT To: Jaci Standard, MD

## 2023-04-05 NOTE — Telephone Encounter (Signed)
TCT patient regarding recent lab results. No answer but was able to leave detailed vm message on her identified phone . Advised that her Plt are closer to target, currently at 438. Her Hgb is strong above 12 and iron stores appear stable. Overall these are good results. We will see her as scheduled on 04/20/2023.  Advised to call with any questions or concerns to [phone]

## 2023-04-12 ENCOUNTER — Telehealth: Payer: Self-pay | Admitting: *Deleted

## 2023-04-12 ENCOUNTER — Encounter: Payer: Self-pay | Admitting: Physician Assistant

## 2023-04-12 NOTE — Telephone Encounter (Signed)
Received call from patient. She states she had 2 days of diarrhea and the stool was darker in color. She states she felt a bit woozy but has since increased her fluid intake. States she feels better today. She is asking if she needs to come in for labs. Advised that since she is feeling better, no frank bleeding noted, that we can wait until her next appt which in 1 week. Pt very much agreed to wait until next week unless something changes. Reviewed appt times with her.

## 2023-04-13 ENCOUNTER — Telehealth: Payer: Self-pay | Admitting: Hematology and Oncology

## 2023-04-16 ENCOUNTER — Encounter: Payer: Self-pay | Admitting: Physician Assistant

## 2023-04-17 ENCOUNTER — Ambulatory Visit: Payer: Medicare Other | Admitting: Gastroenterology

## 2023-04-19 ENCOUNTER — Telehealth: Payer: Self-pay | Admitting: *Deleted

## 2023-04-19 NOTE — Telephone Encounter (Signed)
TCT patient after receiving message from on call service. Pt had a question about clot prevention while on a 4 hour drive. No answer but was able to leave detailed message on her identified phone vm. Advised that a 4 hr drive is fine as long as she stops to walk around every 2 hours, to wear compression stockings and to move her foot/ankle around while in the care to assist in keeping calf muscles working and aiding in blood flow . Advised to call [phone] with any questions or concerns.

## 2023-04-20 ENCOUNTER — Inpatient Hospital Stay: Payer: Medicare Other | Admitting: Hematology and Oncology

## 2023-04-20 ENCOUNTER — Inpatient Hospital Stay: Payer: Medicare Other

## 2023-04-22 ENCOUNTER — Other Ambulatory Visit: Payer: Self-pay | Admitting: Hematology and Oncology

## 2023-04-22 DIAGNOSIS — D500 Iron deficiency anemia secondary to blood loss (chronic): Secondary | ICD-10-CM

## 2023-04-22 NOTE — Progress Notes (Signed)
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 Leonides Schanz  10/26/2021: WBC 6.7, Hgb 13.3, MCV 121.7, Plt 204 03/10/2022: WBC 3.7, Hgb 11.2, MCV 120, Plt 261. Decrease hydroxyurea to 1000 mg BID due to cytopenias.  06/24/2022-06/27/2022: Admitted with worsening GI bleed. Hydroxyurea held.  08/01/2022: WBC 4.4, hemoglobin 9.8, MCV 105.1, and platelets of 190 02/26/2023: WBC 7.3, Hgb 12.5, MCV 109.1, Plt 807. Increase hydroxyurea to 1000 mg daily with 1500 mg every other day 04/27/2023: WBC 4.8, Hgb 12.5, MCV 111.3, Plt 528. Increase hydroxyurea to 1500 mg daily  Interval History:  Kristina Osborne 72 y.o. female with medical history significant for essential thrombocytosis who presents for a follow up visit. The patient's last visit was on 02/26/2023. In the interim since the last visit Ms. Roznowski has continued taking hydroxyurea 500 mg PO BID alternating with 1000 mg in AM and 500 in PM on every other day.   On exam today Kristina Osborne reports she has been fine overall in the interim since her last visit.  She continues to take 500 mg of the hydroxyurea in the morning and thousand at night.  She reports he is not having any side effects as a result of this medication.  She is not having any trouble with nausea, vomiting, or diarrhea.  Her appetite has been good and her energy is strong.  She reports is a good supply of her hydroxyurea medication.  She notes that she is having no signs or symptoms of bleeding at this time.  She denies any dark stools or overt bleeding.  Overall she feels well  with no questions concerns or complaints today.  She denies any fevers, chills, sweats, nausea, vomiting or diarrhea.  A full 10 point ROS is listed below.   MEDICAL HISTORY:  Past Medical History:  Diagnosis Date   ABLA (acute blood loss anemia) 04/16/2022   Congenital malformation of intestinal fixation    Gastritis    Gastroesophageal reflux disease without esophagitis 09/10/2021   IDA (iron deficiency anemia)    Mitral valve prolapse    Thrombocytosis     SURGICAL HISTORY: Past Surgical History:  Procedure Laterality Date   BIOPSY  06/26/2022   Procedure: BIOPSY;  Surgeon: Jenel Lucks, MD;  Location: [facility];  Service: Gastroenterology;;   COLONOSCOPY N/A 04/17/2022   Procedure: COLONOSCOPY;  Surgeon: Beverley Fiedler, MD;  Location: [facility];  Service: Gastroenterology;  Laterality: N/A;   COLONOSCOPY WITH PROPOFOL N/A 06/26/2022   Procedure: COLONOSCOPY WITH PROPOFOL;  Surgeon: Jenel Lucks, MD;  Location: [facility];  Service: Gastroenterology;  Laterality: N/A;   ENTEROSCOPY N/A 04/17/2022   Procedure: ENTEROSCOPY;  Surgeon: Beverley Fiedler, MD;  Location: [facility];  Service: Gastroenterology;  Laterality: N/A;   ENTEROSCOPY N/A 06/26/2022   Procedure: ENTEROSCOPY;  Surgeon: Jenel Lucks, MD;  Location: [facility];  Service: Gastroenterology;  Laterality: N/A;   GIVENS CAPSULE STUDY N/A 06/26/2022   Procedure: GIVENS CAPSULE STUDY;  Surgeon: Jenel Lucks, MD;  Location: [facility];  Service: Gastroenterology;  Laterality: N/A;   HEMOSTASIS CLIP PLACEMENT  04/17/2022   Procedure:  HEMOSTASIS CLIP PLACEMENT;  Surgeon: Beverley Fiedler, MD;  Location: [facility];  Service: Gastroenterology;;   HEMOSTASIS CLIP PLACEMENT  06/26/2022   Procedure: HEMOSTASIS CLIP PLACEMENT;  Surgeon: Jenel Lucks, MD;  Location: [facility];  Service: Gastroenterology;;   HOT HEMOSTASIS N/A 04/17/2022   Procedure: HOT HEMOSTASIS (ARGON PLASMA  COAGULATION/BICAP);  Surgeon: Beverley Fiedler, MD;  Location: [facility];  Service: Gastroenterology;  Laterality: N/A;   HOT HEMOSTASIS N/A 06/26/2022   Procedure: HOT HEMOSTASIS (ARGON PLASMA COAGULATION/BICAP);  Surgeon: Jenel Lucks, MD;  Location: [facility];  Service: Gastroenterology;  Laterality: N/A;   POLYPECTOMY  04/17/2022   Procedure: POLYPECTOMY;  Surgeon: Beverley Fiedler, MD;  Location: [facility];  Service: Gastroenterology;;   POLYPECTOMY  06/26/2022   Procedure: POLYPECTOMY;  Surgeon: Jenel Lucks, MD;  Location: [facility];  Service: Gastroenterology;;   SMALL INTESTINE SURGERY  1994   SUBMUCOSAL TATTOO INJECTION  06/26/2022   Procedure: SUBMUCOSAL TATTOO INJECTION;  Surgeon: Jenel Lucks, MD;  Location: [facility];  Service: Gastroenterology;;    SOCIAL HISTORY: Social History   Socioeconomic History   Marital status: Married    Spouse name: Not on file   Number of children: 0   Years of education: Not on file   Highest education level: Not on file  Occupational History   Not on file  Tobacco Use   Smoking status: Former    Types: Cigarettes   Smokeless tobacco: Never  Vaping Use   Vaping status: Never Used  Substance and Sexual Activity   Alcohol use: Not Currently   Drug use: Never   Sexual activity: Yes    Partners: Male  Other Topics Concern   Not on file  Social History Narrative   Not on file   Social Determinants of Health   Financial Resource Strain: Not on file  Food Insecurity: No Food Insecurity (06/24/2022)   Hunger Vital Sign    Worried About Running Out of Food in the Last Year: Never true    Ran Out of Food in the Last Year: Never true  Transportation Needs: No Transportation Needs (06/24/2022)   PRAPARE - Administrator, Civil Service (Medical): No    Lack of Transportation (Non-Medical): No  Physical Activity: Not on file  Stress: Not on file  Social Connections: Not on file  Intimate  Partner Violence: Not At Risk (06/24/2022)   Humiliation, Afraid, Rape, and Kick questionnaire    Fear of Current or Ex-Partner: No    Emotionally Abused: No    Physically Abused: No    Sexually Abused: No    FAMILY HISTORY: Family History  Problem Relation Age of Onset   Heart disease Mother    Valvular heart disease Mother    Healthy Sister    Alcoholism Half-Sister     ALLERGIES:  is allergic to aspirin and sulfa antibiotics.  MEDICATIONS:  Current Outpatient Medications  Medication Sig Dispense Refill   acetaminophen (TYLENOL) 500 MG tablet Take 1,000 mg by mouth every 6 (six) hours as needed for mild pain or headache.     Ascorbic Acid (VITAMIN C) 1000 MG tablet Take 500 mg by mouth in the morning and at bedtime.     chlorhexidine (PERIDEX) 0.12 % solution 5 mLs by Mouth Rinse route as needed.     cholecalciferol (VITAMIN D3) 25 MCG (1000 UNIT) tablet Take 1,000 Units by mouth daily.     cholestyramine light (PREVALITE) 4 g packet Take 1 packet (  4 g total) by mouth 2 (two) times daily. 180 each 3   famotidine (PEPCID) 40 MG tablet TAKE 1 TABLET BY MOUTH EVERY DAY 90 tablet 1   folic acid (FOLVITE) 1 MG tablet TAKE 1 TABLET BY MOUTH EVERY DAY 90 tablet 1   glucosamine-chondroitin 500-400 MG tablet Take 1 tablet by mouth 2 (two) times daily.     hydroxyurea (HYDREA) 500 MG capsule TAKE 1 CAPSULE BY MOUTH TWICE A DAY 180 capsule 1   metoprolol tartrate (LOPRESSOR) 25 MG tablet Take 0.5 tablets (12.5 mg total) by mouth 2 (two) times daily. NEED OV. 90 tablet 0   Multiple Vitamins-Minerals (MULTIVITAMIN ADULTS 50+) TABS Take 1 tablet by mouth daily.     Multiple Vitamins-Minerals (PRESERVISION AREDS 2+MULTI VIT PO) Take 1 capsule by mouth 2 (two) times daily.     sucralfate (CARAFATE) 1 g tablet Take 1 tablet (1 g total) by mouth 4 (four) times daily -  with meals and at bedtime. 360 tablet 1   tretinoin (RETIN-A) 0.05 % cream Apply topically at bedtime. Apply a pea sized amount  mixed with a quarter sized amount to the affected area three nights a week 45 g 2   No current facility-administered medications for this visit.    REVIEW OF SYSTEMS:   Constitutional: ( - ) fevers, ( - )  chills , ( - ) night sweats Eyes: ( - ) blurriness of vision, ( - ) double vision, ( - ) watery eyes Ears, nose, mouth, throat, and face: ( - ) mucositis, ( - ) sore throat Respiratory: ( - ) cough, ( - ) dyspnea, ( - ) wheezes Cardiovascular: ( - ) palpitation, ( - ) chest discomfort, ( - ) lower extremity swelling Gastrointestinal:  ( - ) nausea, ( - ) heartburn, ( - ) change in bowel habits Skin: ( - ) abnormal skin rashes Lymphatics: ( - ) new lymphadenopathy, ( - ) easy bruising Neurological: ( - ) numbness, ( - ) tingling, ( - ) new weaknesses Behavioral/Psych: ( - ) mood change, ( - ) new changes  All other systems were reviewed with the patient and are negative.  PHYSICAL EXAMINATION:  Vitals:   04/23/23 1020  BP: 109/67  Pulse: 73  Resp: 16  Temp: 98 F (36.7 C)  SpO2: 99%     GENERAL: well appearing elderly Caucasian female, in NAD  SKIN: skin color, texture, turgor are normal, no rashes or significant lesions EYES: conjunctiva are pink and non-injected, sclera clear LUNGS: clear to auscultation and percussion with normal breathing effort HEART: regular rate & rhythm and no murmurs and no lower extremity edema Musculoskeletal: no cyanosis of digits and no clubbing  PSYCH: alert & oriented x 3, fluent speech NEURO: no focal motor/sensory deficits   LABORATORY DATA:  I have reviewed the data as listed    Latest Ref Rng & Units 04/23/2023    9:26 AM 04/04/2023   10:37 AM 03/12/2023   11:26 AM  CBC  WBC 4.0 - 10.5 K/uL 4.8  4.6  6.7   Hemoglobin 12.0 - 15.0 g/dL 08.6  57.8  46.9   Hematocrit 36.0 - 46.0 % 36.4  37.6  38.3   Platelets 150 - 400 K/uL 528  438  564        Latest Ref Rng & Units 04/23/2023    9:26 AM 04/04/2023   10:37 AM 03/12/2023   11:26 AM   CMP  Glucose 70 - 99 mg/dL 91  91  94   BUN 8 - 23 mg/dL 9  13  12    Creatinine 0.44 - 1.00 mg/dL 1.61  0.96  0.45   Sodium 135 - 145 mmol/L 141  138  138   Potassium 3.5 - 5.1 mmol/L 4.1  4.5  4.6   Chloride 98 - 111 mmol/L 106  103  104   CO2 22 - 32 mmol/L 30  30  28    Calcium 8.9 - 10.3 mg/dL 9.9  9.9  40.9   Total Protein 6.5 - 8.1 g/dL 6.9  7.0  7.0   Total Bilirubin 0.3 - 1.2 mg/dL 0.4  0.4  0.4   Alkaline Phos 38 - 126 U/L 60  60  60   AST 15 - 41 U/L 25  24  25    ALT 0 - 44 U/L 19  18  20     RADIOGRAPHIC STUDIES: No results found.  ASSESSMENT & PLAN Kristina Osborne 72 y.o. female with medical history significant for essential thrombocytosis who presents for a follow up visit.   After review of the labs, review of the records, and discussion with the patient the patients findings are most consistent with thrombocytosis, reported due to essential thrombocythemia. We have received the records showing that she has a JAK2 mutation.  Additionally she will require a bone marrow biopsy in order to assure that essential thrombocythemia is the diagnosis.  In the interim we will have the patient continue her hydroxyurea as previously prescribed by her provider in [place].  # Essential thrombocythemia, JAK2 Positive  -- At this time we will need to confirm that the patient has essential thrombocythemia.  She will require a bone marrow biopsy to confirm this diagnosis --JAK2 testing received from patient's prior hematologist. Confirmed JAK2 mutation. Plan:   -- If and when the patient is agreeable we will schedule her for a bone marrow biopsy. --Hydroxyurea was previously held due to GI bleed and anemia.  Anemia is improving with IV iron therapy and therefore we can cotinue hydroxyurea at this time. --increased to 1500 mg hydroxyurea daily -- Labs today show WBC 4.8, hemoglobin 12.5, MCV 111.3, and platelets of 528 --continue with labs q 4 weeks followed by a clinic visit in 8  weeks.  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   --Patient has history of GI bleeding. Patient notes no active maroon/red/black stools.  Appreciate assistance of GI in the management of this case. --Labs today as noted above.  --continue to follow with q 4 weekly labs   No orders of the defined types were placed in this encounter.   All questions were answered. The patient knows to call the clinic with any problems, questions or concerns.  A total of more than 30 minutes were spent on this encounter with face-to-face time and non-face-to-face time, including preparing to see the patient, ordering tests and/or medications, counseling the patient and coordination of care as outlined above.   Ulysees Barns, MD Department of Hematology/Oncology [facility] Phone: [phone] Pager: [phone] Email: [email or web address]  04/27/2023 10:37 AM

## 2023-04-23 ENCOUNTER — Inpatient Hospital Stay: Payer: Medicare Other

## 2023-04-23 ENCOUNTER — Inpatient Hospital Stay (HOSPITAL_BASED_OUTPATIENT_CLINIC_OR_DEPARTMENT_OTHER): Payer: Medicare Other | Admitting: Hematology and Oncology

## 2023-04-23 VITALS — BP 109/67 | HR 73 | Temp 98.0°F | Resp 16 | Ht 66.0 in

## 2023-04-23 DIAGNOSIS — D500 Iron deficiency anemia secondary to blood loss (chronic): Secondary | ICD-10-CM

## 2023-04-23 DIAGNOSIS — D473 Essential (hemorrhagic) thrombocythemia: Secondary | ICD-10-CM

## 2023-04-23 DIAGNOSIS — K5520 Angiodysplasia of colon without hemorrhage: Secondary | ICD-10-CM

## 2023-04-23 LAB — CBC WITH DIFFERENTIAL (CANCER CENTER ONLY)
Abs Immature Granulocytes: 0.02 10*3/uL (ref 0.00–0.07)
Basophils Absolute: 0 10*3/uL (ref 0.0–0.1)
Basophils Relative: 1 %
Eosinophils Absolute: 0 10*3/uL (ref 0.0–0.5)
Eosinophils Relative: 0 %
HCT: 36.4 % (ref 36.0–46.0)
Hemoglobin: 12.5 g/dL (ref 12.0–15.0)
Immature Granulocytes: 0 %
Lymphocytes Relative: 12 %
Lymphs Abs: 0.6 10*3/uL — ABNORMAL LOW (ref 0.7–4.0)
MCH: 38.2 pg — ABNORMAL HIGH (ref 26.0–34.0)
MCHC: 34.3 g/dL (ref 30.0–36.0)
MCV: 111.3 fL — ABNORMAL HIGH (ref 80.0–100.0)
Monocytes Absolute: 0.3 10*3/uL (ref 0.1–1.0)
Monocytes Relative: 7 %
Neutro Abs: 3.8 10*3/uL (ref 1.7–7.7)
Neutrophils Relative %: 80 %
Platelet Count: 528 10*3/uL — ABNORMAL HIGH (ref 150–400)
RBC: 3.27 MIL/uL — ABNORMAL LOW (ref 3.87–5.11)
RDW: 15.7 % — ABNORMAL HIGH (ref 11.5–15.5)
WBC Count: 4.8 10*3/uL (ref 4.0–10.5)
nRBC: 0 % (ref 0.0–0.2)

## 2023-04-23 LAB — CMP (CANCER CENTER ONLY)
ALT: 19 U/L (ref 0–44)
AST: 25 U/L (ref 15–41)
Albumin: 4.4 g/dL (ref 3.5–5.0)
Alkaline Phosphatase: 60 U/L (ref 38–126)
Anion gap: 5 (ref 5–15)
BUN: 9 mg/dL (ref 8–23)
CO2: 30 mmol/L (ref 22–32)
Calcium: 9.9 mg/dL (ref 8.9–10.3)
Chloride: 106 mmol/L (ref 98–111)
Creatinine: 0.74 mg/dL (ref 0.44–1.00)
GFR, Estimated: >60 mL/min (ref >60)
Glucose, Bld: 91 mg/dL (ref 70–99)
Potassium: 4.1 mmol/L (ref 3.5–5.1)
Sodium: 141 mmol/L (ref 135–145)
Total Bilirubin: 0.4 mg/dL (ref 0.3–1.2)
Total Protein: 6.9 g/dL (ref 6.5–8.1)

## 2023-04-23 LAB — FERRITIN: Ferritin: 16 ng/mL (ref 11–307)

## 2023-04-23 LAB — RETIC PANEL
Immature Retic Fract: 21.7 % — ABNORMAL HIGH (ref 2.3–15.9)
RBC.: 3.34 MIL/uL — ABNORMAL LOW (ref 3.87–5.11)
Retic Count, Absolute: 77.8 10*3/uL (ref 19.0–186.0)
Retic Ct Pct: 2.3 % (ref 0.4–3.1)
Reticulocyte Hemoglobin: 33.7 pg (ref >27.9)

## 2023-04-23 LAB — IRON AND IRON BINDING CAPACITY (CC-WL,HP ONLY)
Iron: 45 ug/dL (ref 28–170)
Saturation Ratios: 13 % (ref 10.4–31.8)
TIBC: 336 ug/dL (ref 250–450)
UIBC: 291 ug/dL (ref 148–442)

## 2023-04-27 ENCOUNTER — Encounter: Payer: Self-pay | Admitting: Physician Assistant

## 2023-05-08 ENCOUNTER — Telehealth: Payer: Self-pay

## 2023-05-08 NOTE — Telephone Encounter (Signed)
Pt called stating that she no longer uses [facility] for her prescriptions.  Pt requested if all her prescriptions could be sent to [facility] at the corner of [address] in [place], [place].  Pt requested if her Folic Acid, Hydroxyurea, Sucralfate, and Famotidine could all be sent to [facility].  Informed pt that this nurse would notify Dr. Leonides Schanz of her request.  Pt verbalized understanding and had no further questions.  Updated pt's preferred pharmacy in Epic.

## 2023-05-15 ENCOUNTER — Encounter: Payer: Self-pay | Admitting: Physician Assistant

## 2023-05-15 ENCOUNTER — Ambulatory Visit: Payer: No Typology Code available for payment source | Admitting: Dermatology

## 2023-05-16 ENCOUNTER — Telehealth: Payer: Self-pay | Admitting: *Deleted

## 2023-05-16 NOTE — Telephone Encounter (Signed)
Received call from pt asking if she had to wear her compression stocking while she does water aerobics in a pool. Advised that it is not necessary to wear her stockings in the pool. Pt very relieved.

## 2023-05-18 ENCOUNTER — Other Ambulatory Visit: Payer: Self-pay | Admitting: Internal Medicine

## 2023-05-22 ENCOUNTER — Other Ambulatory Visit: Payer: Self-pay | Admitting: Hematology and Oncology

## 2023-05-22 ENCOUNTER — Other Ambulatory Visit: Payer: Self-pay | Admitting: *Deleted

## 2023-05-22 ENCOUNTER — Inpatient Hospital Stay: Payer: Medicare Other

## 2023-05-22 ENCOUNTER — Other Ambulatory Visit: Payer: Self-pay | Admitting: Gastroenterology

## 2023-05-22 DIAGNOSIS — D500 Iron deficiency anemia secondary to blood loss (chronic): Secondary | ICD-10-CM

## 2023-05-22 MED ORDER — HYDROXYUREA 500 MG PO CAPS: 500.0000 mg | ORAL_CAPSULE | Freq: Two times a day (BID) | ORAL | 1 refills | Status: DC

## 2023-05-22 MED ORDER — FOLIC ACID 1 MG PO TABS: 1.0000 mg | ORAL_TABLET | Freq: Every day | ORAL | 1 refills | Status: DC

## 2023-05-22 MED ORDER — SUCRALFATE 1 G PO TABS: 1.0000 g | ORAL_TABLET | Freq: Three times a day (TID) | ORAL | 1 refills | Status: DC

## 2023-05-22 MED ORDER — FAMOTIDINE 40 MG PO TABS: 40.0000 mg | ORAL_TABLET | Freq: Every day | ORAL | 0 refills | Status: DC

## 2023-05-23 ENCOUNTER — Telehealth: Payer: Self-pay | Admitting: *Deleted

## 2023-05-23 ENCOUNTER — Emergency Department (HOSPITAL_BASED_OUTPATIENT_CLINIC_OR_DEPARTMENT_OTHER): Payer: Medicare Other

## 2023-05-23 ENCOUNTER — Other Ambulatory Visit: Payer: Self-pay

## 2023-05-23 ENCOUNTER — Emergency Department (HOSPITAL_BASED_OUTPATIENT_CLINIC_OR_DEPARTMENT_OTHER)
Admission: EM | Admit: 2023-05-23 | Discharge: 2023-05-23 | Disposition: A | Discharge status: Home / Self Care | Payer: Medicare Other | Attending: Emergency Medicine | Admitting: Emergency Medicine

## 2023-05-23 DIAGNOSIS — W010XXA Fall on same level from slipping, tripping and stumbling without subsequent striking against object, initial encounter: Secondary | ICD-10-CM | POA: Insufficient documentation

## 2023-05-23 DIAGNOSIS — Z79899 Other long term (current) drug therapy: Secondary | ICD-10-CM | POA: Insufficient documentation

## 2023-05-23 DIAGNOSIS — S0990XA Unspecified injury of head, initial encounter: Secondary | ICD-10-CM | POA: Diagnosis present

## 2023-05-23 DIAGNOSIS — W19XXXA Unspecified fall, initial encounter: Secondary | ICD-10-CM

## 2023-05-23 NOTE — ED Triage Notes (Addendum)
Trip/ fall while shopping in [employer]. Fell into a clothing rack, face first. Pain in neck and into shoulders, nose bleed now stopped Happened around 2:30pm C-collar applied

## 2023-05-23 NOTE — Telephone Encounter (Signed)
Received call from patient earlier this afternoon. Pt states she had a fall at [employer] and "Smashed her face into a clothes rack"  She states she has a bad bloody nose and pain in her neck. She is asking where she should go. Encouraged her to go to the ED, suggested [facility] . Pt states she will go there.  Dr. Leonides Schanz made aware.

## 2023-05-23 NOTE — ED Provider Notes (Signed)
EMERGENCY DEPARTMENT AT [facility] Provider Note   CSN: [id number] Arrival date & time: 05/23/23  1415     History  Chief Complaint  Patient presents with   Kristina Osborne    Kristina Osborne is a 72 y.o. female.  72 year old female presents today following a mechanical fall that occurred while she was shopping.  She states she was carrying multiple pillows as well as a hand cart.  She states she felt her foot get caught on something causing her to fall forward.  She struck the forehead, and nose.  No loss consciousness.  She is not on anticoagulation.  Denies any other injury.  She arrives in a c-collar.  She endorses history of cervical stenosis.  She sees [facility] for [facility].  The history is provided by the patient. No language interpreter was used.       Home Medications Prior to Admission medications   Medication Sig Start Date End Date Taking? Authorizing Provider  acetaminophen (TYLENOL) 500 MG tablet Take 1,000 mg by mouth every 6 (six) hours as needed for mild pain or headache.    [provider]  Ascorbic Acid (VITAMIN C) 1000 MG tablet Take 500 mg by mouth in the morning and at bedtime.    [provider]  chlorhexidine (PERIDEX) 0.12 % solution 5 mLs by Mouth Rinse route as needed. 05/29/22   [provider]  cholecalciferol (VITAMIN D3) 25 MCG (1000 UNIT) tablet Take 1,000 Units by mouth daily.    [provider]  cholestyramine light (PREVALITE) 4 g packet Take 1 packet (4 g total) by mouth 2 (two) times daily. 07/17/22   Corwin Levins, MD  famotidine (PEPCID) 40 MG tablet Take 1 tablet (40 mg total) by mouth daily. 05/22/23   Jaci Standard, MD  folic acid (FOLVITE) 1 MG tablet Take 1 tablet (1 mg total) by mouth daily. 05/22/23   Jaci Standard, MD  glucosamine-chondroitin 500-400 MG tablet Take 1 tablet by mouth 2 (two) times daily.    [provider]  hydroxyurea (HYDREA) 500 MG capsule Take 1 capsule  (500 mg total) by mouth 2 (two) times daily. 05/22/23   Jaci Standard, MD  metoprolol tartrate (LOPRESSOR) 25 MG tablet TAKE 0.5 TABLETS (12.5 MG TOTAL) BY MOUTH 2 (TWO) TIMES DAILY. NEED OFFICE VISIT 05/18/23   [name], MD  Multiple Vitamins-Minerals (MULTIVITAMIN ADULTS 50+) TABS Take 1 tablet by mouth daily.    [provider]  Multiple Vitamins-Minerals (PRESERVISION AREDS 2+MULTI VIT PO) Take 1 capsule by mouth 2 (two) times daily.    [provider]  sucralfate (CARAFATE) 1 g tablet Take 1 tablet (1 g total) by mouth 4 (four) times daily -  with meals and at bedtime. 05/22/23 11/18/23  Jaci Standard, MD  tretinoin (RETIN-A) 0.05 % cream Apply topically at bedtime. Apply a pea sized amount mixed with a quarter sized amount to the affected area three nights a week 12/25/22 12/25/23  Terri Piedra, DO      Allergies    Aspirin and Sulfa antibiotics    Review of Systems   Review of Systems  Constitutional:  Negative for fever.  Eyes:  Negative for visual disturbance.  Musculoskeletal:  Negative for arthralgias.  Neurological:  Positive for headaches.  All other systems reviewed and are negative.   Physical Exam Updated Vital Signs BP 134/67 (BP Location: Right Arm)   Pulse 89   Temp 98.7  F (37.1 C)   Resp 18   LMP  (LMP Unknown)   SpO2 99%  Physical Exam Vitals and nursing note reviewed.  Constitutional:      General: She is not in acute distress.    Appearance: Normal appearance. She is not ill-appearing.  HENT:     Head: Normocephalic and atraumatic.     Comments: Small hematoma noted in the mid forehead.  No tenderness palpation over the nasal bridge.    Nose: Nose normal.  Eyes:     Conjunctiva/sclera: Conjunctivae normal.  Cardiovascular:     Rate and Rhythm: Normal rate and regular rhythm.  Pulmonary:     Effort: Pulmonary effort is normal. No respiratory distress.  Abdominal:     Palpations: Abdomen is soft.  Musculoskeletal:         General: No deformity. Normal range of motion.     Cervical back: Normal range of motion.  Skin:    Findings: No rash.  Neurological:     Mental Status: She is alert.     ED Results / Procedures / Treatments   Labs (all labs ordered are listed, but only abnormal results are displayed) Labs Reviewed - No data to display  EKG None  Radiology CT Head Wo Contrast  Result Date: 05/23/2023 CLINICAL DATA:  Head trauma, minor (Age >= 65y); Neck trauma (Age >= 65y) EXAM: CT HEAD WITHOUT CONTRAST CT CERVICAL SPINE WITHOUT CONTRAST TECHNIQUE: Multidetector CT imaging of the head and cervical spine was performed following the standard protocol without intravenous contrast. Multiplanar CT image reconstructions of the cervical spine were also generated. RADIATION DOSE REDUCTION: This exam was performed according to the departmental dose-optimization program which includes automated exposure control, adjustment of the mA and/or kV according to patient size and/or use of iterative reconstruction technique. COMPARISON:  CT head 05/11/2021. FINDINGS: CT HEAD FINDINGS Brain: No evidence of acute infarction, hemorrhage, hydrocephalus, extra-axial collection or mass lesion/mass effect. Vascular: No hyperdense vessel. Skull: No acute fracture. Sinuses/Orbits: Clear sinuses.  No acute orbital findings. CT CERVICAL SPINE FINDINGS Alignment: Straightening.  No substantial sagittal subluxation. Skull base and vertebrae: No acute fracture. No primary bone lesion or focal pathologic process. Soft tissues and spinal canal: No prevertebral fluid or swelling. No visible canal hematoma. Disc levels: Multilevel facet and uncovertebral hypertrophy with varying degrees of neural foraminal stenosis, potentially greatest on the right at C5-C6. Calcified disc bulge at C3-C4 Upper chest: Visualized lung apices are clear. IMPRESSION: 1. No evidence of acute intracranial abnormality. 2. No evidence of acute fracture or traumatic  malalignment in the cervical spine. 3. Multilevel foraminal stenosis, potentially greatest on the right at C5-C6. Electronically Signed   By: Feliberto Harts M.D.   On: 05/23/2023 17:22   CT Cervical Spine Wo Contrast  Result Date: 05/23/2023 CLINICAL DATA:  Head trauma, minor (Age >= 65y); Neck trauma (Age >= 65y) EXAM: CT HEAD WITHOUT CONTRAST CT CERVICAL SPINE WITHOUT CONTRAST TECHNIQUE: Multidetector CT imaging of the head and cervical spine was performed following the standard protocol without intravenous contrast. Multiplanar CT image reconstructions of the cervical spine were also generated. RADIATION DOSE REDUCTION: This exam was performed according to the departmental dose-optimization program which includes automated exposure control, adjustment of the mA and/or kV according to patient size and/or use of iterative reconstruction technique. COMPARISON:  CT head 05/11/2021. FINDINGS: CT HEAD FINDINGS Brain: No evidence of acute infarction, hemorrhage, hydrocephalus, extra-axial collection or mass lesion/mass effect. Vascular: No hyperdense vessel. Skull: No acute fracture. Sinuses/Orbits:  Clear sinuses.  No acute orbital findings. CT CERVICAL SPINE FINDINGS Alignment: Straightening.  No substantial sagittal subluxation. Skull base and vertebrae: No acute fracture. No primary bone lesion or focal pathologic process. Soft tissues and spinal canal: No prevertebral fluid or swelling. No visible canal hematoma. Disc levels: Multilevel facet and uncovertebral hypertrophy with varying degrees of neural foraminal stenosis, potentially greatest on the right at C5-C6. Calcified disc bulge at C3-C4 Upper chest: Visualized lung apices are clear. IMPRESSION: 1. No evidence of acute intracranial abnormality. 2. No evidence of acute fracture or traumatic malalignment in the cervical spine. 3. Multilevel foraminal stenosis, potentially greatest on the right at C5-C6. Electronically Signed   By: Feliberto Harts M.D.    On: 05/23/2023 17:22    Procedures Procedures    Medications Ordered in ED Medications - No data to display  ED Course/ Medical Decision Making/ A&P                                 Medical Decision Making Amount and/or Complexity of Data Reviewed Radiology: ordered.   72 year old female presents today following a mechanical fall that occurred earlier while she was at the store.  She struck the clothing rack.  No loss of consciousness.  She is not on anticoagulation.  Does have a hematoma to the middle of the forehead.  No tenderness or bruising to the nasal bridge.  CT head and cervical spine obtained.  Shows foraminal stenosis of the cervical spine otherwise no acute intracranial or cervical spine pathology.  C-collar removed.  Supportive care discussed.  Otherwise she has full range of motion in all extremities without tenderness to palpation.  She ambulates without difficulty.  She is appropriate for discharge.  Final Clinical Impression(s) / ED Diagnoses Final diagnoses:  Fall, initial encounter  Injury of head, initial encounter    Rx / DC Orders ED Discharge Orders     None         Marita Kansas, PA-C 05/23/23 1755    Terrilee Files, MD 05/24/23 1029

## 2023-05-23 NOTE — ED Notes (Signed)
RN reviewed discharge instructions with pt. Pt verbalized understanding and had no further questions. VSS upon discharge.

## 2023-05-23 NOTE — Discharge Instructions (Signed)
Your CT scan of the head and neck did not show any concerning findings.  Use Tylenol as you need to for pain control.  You can ice the affected area as well.  For any concerning symptoms return to the emergency room.  I have attached information regarding concussion as well.  If you notice any concerning symptoms such as severe headache, nausea and vomiting to the point you are unable to keep anything down or vision change return for evaluation otherwise concussion symptoms will resolve in about a week.  If they last longer than a week follow-up with your primary care provider.

## 2023-05-24 ENCOUNTER — Inpatient Hospital Stay: Payer: Medicare Other | Attending: Hematology and Oncology

## 2023-05-24 ENCOUNTER — Telehealth: Payer: Self-pay

## 2023-05-24 DIAGNOSIS — D473 Essential (hemorrhagic) thrombocythemia: Secondary | ICD-10-CM | POA: Diagnosis present

## 2023-05-24 DIAGNOSIS — Z79899 Other long term (current) drug therapy: Secondary | ICD-10-CM | POA: Diagnosis not present

## 2023-05-24 DIAGNOSIS — D509 Iron deficiency anemia, unspecified: Secondary | ICD-10-CM | POA: Diagnosis present

## 2023-05-24 DIAGNOSIS — D500 Iron deficiency anemia secondary to blood loss (chronic): Secondary | ICD-10-CM

## 2023-05-24 LAB — CMP (CANCER CENTER ONLY)
ALT: 16 U/L (ref 0–44)
AST: 23 U/L (ref 15–41)
Albumin: 4.3 g/dL (ref 3.5–5.0)
Alkaline Phosphatase: 55 U/L (ref 38–126)
Anion gap: 4 — ABNORMAL LOW (ref 5–15)
BUN: 11 mg/dL (ref 8–23)
CO2: 27 mmol/L (ref 22–32)
Calcium: 9.7 mg/dL (ref 8.9–10.3)
Chloride: 106 mmol/L (ref 98–111)
Creatinine: 0.75 mg/dL (ref 0.44–1.00)
GFR, Estimated: >60 mL/min (ref >60)
Glucose, Bld: 101 mg/dL — ABNORMAL HIGH (ref 70–99)
Potassium: 4.4 mmol/L (ref 3.5–5.1)
Sodium: 137 mmol/L (ref 135–145)
Total Bilirubin: 0.5 mg/dL (ref 0.3–1.2)
Total Protein: 6.7 g/dL (ref 6.5–8.1)

## 2023-05-24 LAB — IRON AND IRON BINDING CAPACITY (CC-WL,HP ONLY)
Iron: 43 ug/dL (ref 28–170)
Saturation Ratios: 12 % (ref 10.4–31.8)
TIBC: 371 ug/dL (ref 250–450)
UIBC: 328 ug/dL (ref 148–442)

## 2023-05-24 LAB — CBC WITH DIFFERENTIAL (CANCER CENTER ONLY)
Abs Immature Granulocytes: 0.02 10*3/uL (ref 0.00–0.07)
Basophils Absolute: 0 10*3/uL (ref 0.0–0.1)
Basophils Relative: 0 %
Eosinophils Absolute: 0 10*3/uL (ref 0.0–0.5)
Eosinophils Relative: 0 %
HCT: 32.9 % — ABNORMAL LOW (ref 36.0–46.0)
Hemoglobin: 11 g/dL — ABNORMAL LOW (ref 12.0–15.0)
Immature Granulocytes: 1 %
Lymphocytes Relative: 11 %
Lymphs Abs: 0.5 10*3/uL — ABNORMAL LOW (ref 0.7–4.0)
MCH: 39.6 pg — ABNORMAL HIGH (ref 26.0–34.0)
MCHC: 33.4 g/dL (ref 30.0–36.0)
MCV: 118.3 fL — ABNORMAL HIGH (ref 80.0–100.0)
Monocytes Absolute: 0.3 10*3/uL (ref 0.1–1.0)
Monocytes Relative: 6 %
Neutro Abs: 3.6 10*3/uL (ref 1.7–7.7)
Neutrophils Relative %: 82 %
Platelet Count: 364 10*3/uL (ref 150–400)
RBC: 2.78 MIL/uL — ABNORMAL LOW (ref 3.87–5.11)
RDW: 15.7 % — ABNORMAL HIGH (ref 11.5–15.5)
WBC Count: 4.4 10*3/uL (ref 4.0–10.5)
nRBC: 0 % (ref 0.0–0.2)

## 2023-05-24 LAB — FERRITIN: Ferritin: 14 ng/mL (ref 11–307)

## 2023-05-24 LAB — RETIC PANEL
Immature Retic Fract: 25.6 % — ABNORMAL HIGH (ref 2.3–15.9)
RBC.: 2.76 MIL/uL — ABNORMAL LOW (ref 3.87–5.11)
Retic Count, Absolute: 54.1 10*3/uL (ref 19.0–186.0)
Retic Ct Pct: 2 % (ref 0.4–3.1)
Reticulocyte Hemoglobin: 39.8 pg (ref >27.9)

## 2023-05-24 NOTE — Telephone Encounter (Signed)
Pt called to inform staff that she had a CT scan done yesterday after her fall in the store and the CT showed that she had a mild concussion but no bleeding present.  She states that she noticed her hgb dropped 1.5 points and was wondering if she needed treatment. This RN stated that we do not transfuse with a hgb less than 8 or if she is symptomatic usually. Pt asked if she needed to receive an iron transfusion and this RN stated that we have to wait for her ferritin and other iron labs to come back before we make that decision and it usually takes about a day or so but if iron is needed we will call. Pt made aware that her platelets are WNL at 364. Pt stated understanding.   Pt also requested refills on her folic acid and pepcid at [facility] on [facility]. This RN stated that a refill request was sent by our office on 05/22/2023 and suggested calling her pharmacy to see if it is ready for pick up. Pt stated understanding.

## 2023-06-01 ENCOUNTER — Other Ambulatory Visit: Payer: Self-pay | Admitting: Internal Medicine

## 2023-06-20 ENCOUNTER — Other Ambulatory Visit: Payer: Self-pay

## 2023-06-20 ENCOUNTER — Inpatient Hospital Stay (HOSPITAL_BASED_OUTPATIENT_CLINIC_OR_DEPARTMENT_OTHER): Payer: Medicare Other | Admitting: Hematology and Oncology

## 2023-06-20 ENCOUNTER — Inpatient Hospital Stay: Payer: Medicare Other | Attending: Hematology and Oncology

## 2023-06-20 VITALS — BP 129/73 | HR 80 | Temp 98.0°F | Resp 16

## 2023-06-20 DIAGNOSIS — D500 Iron deficiency anemia secondary to blood loss (chronic): Secondary | ICD-10-CM | POA: Diagnosis not present

## 2023-06-20 DIAGNOSIS — Z79899 Other long term (current) drug therapy: Secondary | ICD-10-CM | POA: Insufficient documentation

## 2023-06-20 DIAGNOSIS — Z87891 Personal history of nicotine dependence: Secondary | ICD-10-CM | POA: Diagnosis not present

## 2023-06-20 DIAGNOSIS — D509 Iron deficiency anemia, unspecified: Secondary | ICD-10-CM | POA: Diagnosis present

## 2023-06-20 DIAGNOSIS — K5520 Angiodysplasia of colon without hemorrhage: Secondary | ICD-10-CM | POA: Diagnosis not present

## 2023-06-20 DIAGNOSIS — D473 Essential (hemorrhagic) thrombocythemia: Secondary | ICD-10-CM | POA: Insufficient documentation

## 2023-06-20 LAB — CBC WITH DIFFERENTIAL (CANCER CENTER ONLY)
Abs Immature Granulocytes: 0.02 10*3/uL (ref 0.00–0.07)
Basophils Absolute: 0 10*3/uL (ref 0.0–0.1)
Basophils Relative: 1 %
Eosinophils Absolute: 0 10*3/uL (ref 0.0–0.5)
Eosinophils Relative: 0 %
HCT: 31.9 % — ABNORMAL LOW (ref 36.0–46.0)
Hemoglobin: 10.8 g/dL — ABNORMAL LOW (ref 12.0–15.0)
Immature Granulocytes: 1 %
Lymphocytes Relative: 12 %
Lymphs Abs: 0.5 10*3/uL — ABNORMAL LOW (ref 0.7–4.0)
MCH: 39.1 pg — ABNORMAL HIGH (ref 26.0–34.0)
MCHC: 33.9 g/dL (ref 30.0–36.0)
MCV: 115.6 fL — ABNORMAL HIGH (ref 80.0–100.0)
Monocytes Absolute: 0.3 10*3/uL (ref 0.1–1.0)
Monocytes Relative: 8 %
Neutro Abs: 3 10*3/uL (ref 1.7–7.7)
Neutrophils Relative %: 78 %
Platelet Count: 146 10*3/uL — ABNORMAL LOW (ref 150–400)
RBC: 2.76 MIL/uL — ABNORMAL LOW (ref 3.87–5.11)
RDW: 14 % (ref 11.5–15.5)
WBC Count: 3.8 10*3/uL — ABNORMAL LOW (ref 4.0–10.5)
nRBC: 0 % (ref 0.0–0.2)

## 2023-06-20 LAB — CMP (CANCER CENTER ONLY)
ALT: 15 U/L (ref 0–44)
AST: 23 U/L (ref 15–41)
Albumin: 4.4 g/dL (ref 3.5–5.0)
Alkaline Phosphatase: 59 U/L (ref 38–126)
Anion gap: 5 (ref 5–15)
BUN: 9 mg/dL (ref 8–23)
CO2: 29 mmol/L (ref 22–32)
Calcium: 9.9 mg/dL (ref 8.9–10.3)
Chloride: 104 mmol/L (ref 98–111)
Creatinine: 0.78 mg/dL (ref 0.44–1.00)
GFR, Estimated: >60 mL/min (ref >60)
Glucose, Bld: 131 mg/dL — ABNORMAL HIGH (ref 70–99)
Potassium: 3.9 mmol/L (ref 3.5–5.1)
Sodium: 138 mmol/L (ref 135–145)
Total Bilirubin: 0.6 mg/dL (ref 0.3–1.2)
Total Protein: 6.7 g/dL (ref 6.5–8.1)

## 2023-06-20 LAB — RETIC PANEL
Immature Retic Fract: 21 % — ABNORMAL HIGH (ref 2.3–15.9)
RBC.: 2.76 MIL/uL — ABNORMAL LOW (ref 3.87–5.11)
Retic Count, Absolute: 47.2 10*3/uL (ref 19.0–186.0)
Retic Ct Pct: 1.7 % (ref 0.4–3.1)
Reticulocyte Hemoglobin: 34.3 pg (ref >27.9)

## 2023-06-20 LAB — FERRITIN: Ferritin: 7 ng/mL — ABNORMAL LOW (ref 11–307)

## 2023-06-20 LAB — IRON AND IRON BINDING CAPACITY (CC-WL,HP ONLY)
Iron: 60 ug/dL (ref 28–170)
Saturation Ratios: 15 % (ref 10.4–31.8)
TIBC: 400 ug/dL (ref 250–450)
UIBC: 340 ug/dL (ref 148–442)

## 2023-06-20 NOTE — Progress Notes (Signed)
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 Leonides Schanz  10/26/2021: WBC 6.7, Hgb 13.3, MCV 121.7, Plt 204 03/10/2022: WBC 3.7, Hgb 11.2, MCV 120, Plt 261. Decrease hydroxyurea to 1000 mg BID due to cytopenias.  06/24/2022-06/27/2022: Admitted with worsening GI bleed. Hydroxyurea held.  08/01/2022: WBC 4.4, hemoglobin 9.8, MCV 105.1, and platelets of 190 02/26/2023: WBC 7.3, Hgb 12.5, MCV 109.1, Plt 807. Increase hydroxyurea to 1000 mg daily with 1500 mg every other day 04/27/2023: WBC 4.8, Hgb 12.5, MCV 111.3, Plt 528. Increase hydroxyurea to 1500 mg daily 06/20/2023: Return dose to hydroxyurea 500 mg twice daily  Interval History:  Kristina Osborne 72 y.o. female with medical history significant for essential thrombocytosis who presents for a follow up visit. The patient's last visit was on 04/23/2023. In the interim since the last visit Kristina Osborne has continued taking hydroxyurea 500 mg PO BID alternating with 1000 mg in AM and 500 in PM on every other day.   On exam today Kristina Osborne reports she has been well overall in the interim since her last visit.  She reports that she has good energy and appetite.  She is not having any bleeding, bruising, or dark stools, though she did have an episode where she tripped and fell at a department store and hit her face on a garment rack.  She did develop a blood in the nose at that time and went to the emergency department.  She underwent MRI which showed no issues and she never had to hold her medications.  She reports that she is nervous  about the drop in her platelet count and would like to go back to 500 mg twice daily dosing.  She is looking forward to an upcoming trip from Friday to Sunday to [place].  Otherwise she denies any overt GI bleeding or new symptoms.  She did have some discoloration of her stool recently but thought maybe it was some carrots that she had eaten.  Overall she feels well with no questions concerns or complaints today.  She denies any fevers, chills, sweats, nausea, vomiting or diarrhea.  A full 10 point ROS is listed below.   MEDICAL HISTORY:  Past Medical History:  Diagnosis Date   ABLA (acute blood loss anemia) 04/16/2022   Congenital malformation of intestinal fixation    Gastritis    Gastroesophageal reflux disease without esophagitis 09/10/2021   IDA (iron deficiency anemia)    Mitral valve prolapse    Thrombocytosis     SURGICAL HISTORY: Past Surgical History:  Procedure Laterality Date   BIOPSY  06/26/2022   Procedure: BIOPSY;  Surgeon: Jenel Lucks, MD;  Location: [facility];  Service: Gastroenterology;;   COLONOSCOPY N/A 04/17/2022   Procedure: COLONOSCOPY;  Surgeon: Beverley Fiedler, MD;  Location: [facility];  Service: Gastroenterology;  Laterality: N/A;   COLONOSCOPY WITH PROPOFOL N/A 06/26/2022   Procedure: COLONOSCOPY WITH PROPOFOL;  Surgeon: Jenel Lucks, MD;  Location: [facility];  Service: Gastroenterology;  Laterality: N/A;   ENTEROSCOPY N/A 04/17/2022   Procedure: ENTEROSCOPY;  Surgeon: Beverley Fiedler, MD;  Location: [facility];  Service: Gastroenterology;  Laterality: N/A;   ENTEROSCOPY N/A 06/26/2022   Procedure: ENTEROSCOPY;  Surgeon: Jenel Lucks, MD;  Location: [facility];  Service: Gastroenterology;  Laterality: N/A;   GIVENS CAPSULE STUDY N/A 06/26/2022   Procedure: GIVENS CAPSULE STUDY;  Surgeon: Jenel Lucks, MD;  Location: [facility];  Service: Gastroenterology;  Laterality: N/A;   HEMOSTASIS CLIP PLACEMENT  04/17/2022    Procedure: HEMOSTASIS CLIP PLACEMENT;  Surgeon: Beverley Fiedler, MD;  Location: [facility];  Service: Gastroenterology;;   HEMOSTASIS CLIP PLACEMENT  06/26/2022   Procedure: HEMOSTASIS CLIP PLACEMENT;  Surgeon: Jenel Lucks, MD;  Location: [facility];  Service: Gastroenterology;;   HOT HEMOSTASIS N/A 04/17/2022   Procedure: HOT HEMOSTASIS (ARGON PLASMA COAGULATION/BICAP);  Surgeon: Beverley Fiedler, MD;  Location: [facility];  Service: Gastroenterology;  Laterality: N/A;   HOT HEMOSTASIS N/A 06/26/2022   Procedure: HOT HEMOSTASIS (ARGON PLASMA COAGULATION/BICAP);  Surgeon: Jenel Lucks, MD;  Location: [facility];  Service: Gastroenterology;  Laterality: N/A;   POLYPECTOMY  04/17/2022   Procedure: POLYPECTOMY;  Surgeon: Beverley Fiedler, MD;  Location: [facility];  Service: Gastroenterology;;   POLYPECTOMY  06/26/2022   Procedure: POLYPECTOMY;  Surgeon: Jenel Lucks, MD;  Location: [facility];  Service: Gastroenterology;;   SMALL INTESTINE SURGERY  1994   SUBMUCOSAL TATTOO INJECTION  06/26/2022   Procedure: SUBMUCOSAL TATTOO INJECTION;  Surgeon: Jenel Lucks, MD;  Location: [facility];  Service: Gastroenterology;;    SOCIAL HISTORY: Social History   Socioeconomic History   Marital status: Married    Spouse name: Not on file   Number of children: 0   Years of education: Not on file   Highest education level: Not on file  Occupational History   Not on file  Tobacco Use   Smoking status: Former    Types: Cigarettes   Smokeless tobacco: Never  Vaping Use   Vaping status: Never Used  Substance and Sexual Activity   Alcohol use: Not Currently   Drug use: Never   Sexual activity: Yes    Partners: Male  Other Topics Concern   Not on file  Social History Narrative   Not on file   Social Determinants of Health   Financial Resource Strain: Not on file  Food Insecurity: No Food Insecurity (06/24/2022)   Hunger Vital Sign    Worried About Running Out  of Food in the Last Year: Never true    Ran Out of Food in the Last Year: Never true  Transportation Needs: No Transportation Needs (06/24/2022)   PRAPARE - Administrator, Civil Service (Medical): No    Lack of Transportation (Non-Medical): No  Physical Activity: Not on file  Stress: Not on file  Social Connections: Not on file  Intimate Partner Violence: Not At Risk (06/24/2022)   Humiliation, Afraid, Rape, and Kick questionnaire    Fear of Current or Ex-Partner: No    Emotionally Abused: No    Physically Abused: No    Sexually Abused: No    FAMILY HISTORY: Family History  Problem Relation Age of Onset   Heart disease Mother    Valvular heart disease Mother    Healthy Sister    Alcoholism Half-Sister     ALLERGIES:  is allergic to aspirin and sulfa antibiotics.  MEDICATIONS:  Current Outpatient Medications  Medication Sig Dispense Refill   acetaminophen (TYLENOL) 500 MG tablet Take 1,000 mg by mouth every 6 (six) hours as needed for mild pain or headache.  Ascorbic Acid (VITAMIN C) 1000 MG tablet Take 500 mg by mouth in the morning and at bedtime.     chlorhexidine (PERIDEX) 0.12 % solution 5 mLs by Mouth Rinse route as needed.     cholecalciferol (VITAMIN D3) 25 MCG (1000 UNIT) tablet Take 1,000 Units by mouth daily.     cholestyramine light (PREVALITE) 4 g packet Take 1 packet (4 g total) by mouth 2 (two) times daily. 180 each 3   famotidine (PEPCID) 40 MG tablet Take 1 tablet (40 mg total) by mouth daily. 90 tablet 0   folic acid (FOLVITE) 1 MG tablet Take 1 tablet (1 mg total) by mouth daily. 90 tablet 1   glucosamine-chondroitin 500-400 MG tablet Take 1 tablet by mouth 2 (two) times daily.     hydroxyurea (HYDREA) 500 MG capsule Take 1 capsule (500 mg total) by mouth 2 (two) times daily. 180 capsule 1   metoprolol tartrate (LOPRESSOR) 25 MG tablet Take 0.5 tablets (12.5 mg total) by mouth 2 (two) times daily. Please call office to schedule an appt for  further refills. Thank you 30 tablet 0   Multiple Vitamins-Minerals (MULTIVITAMIN ADULTS 50+) TABS Take 1 tablet by mouth daily.     Multiple Vitamins-Minerals (PRESERVISION AREDS 2+MULTI VIT PO) Take 1 capsule by mouth 2 (two) times daily.     sucralfate (CARAFATE) 1 g tablet Take 1 tablet (1 g total) by mouth 4 (four) times daily -  with meals and at bedtime. 360 tablet 1   tretinoin (RETIN-A) 0.05 % cream Apply topically at bedtime. Apply a pea sized amount mixed with a quarter sized amount to the affected area three nights a week 45 g 2   No current facility-administered medications for this visit.    REVIEW OF SYSTEMS:   Constitutional: ( - ) fevers, ( - )  chills , ( - ) night sweats Eyes: ( - ) blurriness of vision, ( - ) double vision, ( - ) watery eyes Ears, nose, mouth, throat, and face: ( - ) mucositis, ( - ) sore throat Respiratory: ( - ) cough, ( - ) dyspnea, ( - ) wheezes Cardiovascular: ( - ) palpitation, ( - ) chest discomfort, ( - ) lower extremity swelling Gastrointestinal:  ( - ) nausea, ( - ) heartburn, ( - ) change in bowel habits Skin: ( - ) abnormal skin rashes Lymphatics: ( - ) new lymphadenopathy, ( - ) easy bruising Neurological: ( - ) numbness, ( - ) tingling, ( - ) new weaknesses Behavioral/Psych: ( - ) mood change, ( - ) new changes  All other systems were reviewed with the patient and are negative.  PHYSICAL EXAMINATION:  Vitals:   06/20/23 1448  BP: 129/73  Pulse: 80  Resp: 16  Temp: 98 F (36.7 C)  SpO2: 98%      GENERAL: well appearing elderly Caucasian female, in NAD  SKIN: skin color, texture, turgor are normal, no rashes or significant lesions EYES: conjunctiva are pink and non-injected, sclera clear LUNGS: clear to auscultation and percussion with normal breathing effort HEART: regular rate & rhythm and no murmurs and no lower extremity edema Musculoskeletal: no cyanosis of digits and no clubbing  PSYCH: alert & oriented x 3, fluent  speech NEURO: no focal motor/sensory deficits   LABORATORY DATA:  I have reviewed the data as listed    Latest Ref Rng & Units 06/20/2023    2:11 PM 05/24/2023   11:01 AM 04/23/2023    9:26 AM  CBC  WBC 4.0 - 10.5 K/uL 3.8  4.4  4.8   Hemoglobin 12.0 - 15.0 g/dL 36.6  44.0  34.7   Hematocrit 36.0 - 46.0 % 31.9  32.9  36.4   Platelets 150 - 400 K/uL 146  364  528        Latest Ref Rng & Units 06/20/2023    2:11 PM 05/24/2023   11:01 AM 04/23/2023    9:26 AM  CMP  Glucose 70 - 99 mg/dL 425  956  91   BUN 8 - 23 mg/dL 9  11  9    Creatinine 0.44 - 1.00 mg/dL 3.87  5.64  3.32   Sodium 135 - 145 mmol/L 138  137  141   Potassium 3.5 - 5.1 mmol/L 3.9  4.4  4.1   Chloride 98 - 111 mmol/L 104  106  106   CO2 22 - 32 mmol/L 29  27  30    Calcium 8.9 - 10.3 mg/dL 9.9  9.7  9.9   Total Protein 6.5 - 8.1 g/dL 6.7  6.7  6.9   Total Bilirubin 0.3 - 1.2 mg/dL 0.6  0.5  0.4   Alkaline Phos 38 - 126 U/L 59  55  60   AST 15 - 41 U/L 23  23  25    ALT 0 - 44 U/L 15  16  19     RADIOGRAPHIC STUDIES: CT Head Wo Contrast  Result Date: 05/23/2023 CLINICAL DATA:  Head trauma, minor (Age >= 65y); Neck trauma (Age >= 65y) EXAM: CT HEAD WITHOUT CONTRAST CT CERVICAL SPINE WITHOUT CONTRAST TECHNIQUE: Multidetector CT imaging of the head and cervical spine was performed following the standard protocol without intravenous contrast. Multiplanar CT image reconstructions of the cervical spine were also generated. RADIATION DOSE REDUCTION: This exam was performed according to the departmental dose-optimization program which includes automated exposure control, adjustment of the mA and/or kV according to patient size and/or use of iterative reconstruction technique. COMPARISON:  CT head 05/11/2021. FINDINGS: CT HEAD FINDINGS Brain: No evidence of acute infarction, hemorrhage, hydrocephalus, extra-axial collection or mass lesion/mass effect. Vascular: No hyperdense vessel. Skull: No acute fracture. Sinuses/Orbits: Clear  sinuses.  No acute orbital findings. CT CERVICAL SPINE FINDINGS Alignment: Straightening.  No substantial sagittal subluxation. Skull base and vertebrae: No acute fracture. No primary bone lesion or focal pathologic process. Soft tissues and spinal canal: No prevertebral fluid or swelling. No visible canal hematoma. Disc levels: Multilevel facet and uncovertebral hypertrophy with varying degrees of neural foraminal stenosis, potentially greatest on the right at C5-C6. Calcified disc bulge at C3-C4 Upper chest: Visualized lung apices are clear. IMPRESSION: 1. No evidence of acute intracranial abnormality. 2. No evidence of acute fracture or traumatic malalignment in the cervical spine. 3. Multilevel foraminal stenosis, potentially greatest on the right at C5-C6. Electronically Signed   By: Feliberto Harts M.D.   On: 05/23/2023 17:22   CT Cervical Spine Wo Contrast  Result Date: 05/23/2023 CLINICAL DATA:  Head trauma, minor (Age >= 65y); Neck trauma (Age >= 65y) EXAM: CT HEAD WITHOUT CONTRAST CT CERVICAL SPINE WITHOUT CONTRAST TECHNIQUE: Multidetector CT imaging of the head and cervical spine was performed following the standard protocol without intravenous contrast. Multiplanar CT image reconstructions of the cervical spine were also generated. RADIATION DOSE REDUCTION: This exam was performed according to the departmental dose-optimization program which includes automated exposure control, adjustment of the mA and/or kV according to patient size and/or use of iterative reconstruction technique. COMPARISON:  CT head  05/11/2021. FINDINGS: CT HEAD FINDINGS Brain: No evidence of acute infarction, hemorrhage, hydrocephalus, extra-axial collection or mass lesion/mass effect. Vascular: No hyperdense vessel. Skull: No acute fracture. Sinuses/Orbits: Clear sinuses.  No acute orbital findings. CT CERVICAL SPINE FINDINGS Alignment: Straightening.  No substantial sagittal subluxation. Skull base and vertebrae: No acute  fracture. No primary bone lesion or focal pathologic process. Soft tissues and spinal canal: No prevertebral fluid or swelling. No visible canal hematoma. Disc levels: Multilevel facet and uncovertebral hypertrophy with varying degrees of neural foraminal stenosis, potentially greatest on the right at C5-C6. Calcified disc bulge at C3-C4 Upper chest: Visualized lung apices are clear. IMPRESSION: 1. No evidence of acute intracranial abnormality. 2. No evidence of acute fracture or traumatic malalignment in the cervical spine. 3. Multilevel foraminal stenosis, potentially greatest on the right at C5-C6. Electronically Signed   By: Feliberto Harts M.D.   On: 05/23/2023 17:22    ASSESSMENT & PLAN Kristina Osborne 72 y.o. female with medical history significant for essential thrombocytosis who presents for a follow up visit.   After review of the labs, review of the records, and discussion with the patient the patients findings are most consistent with thrombocytosis, reported due to essential thrombocythemia. We have received the records showing that she has a JAK2 mutation.  Additionally she will require a bone marrow biopsy in order to assure that essential thrombocythemia is the diagnosis.  In the interim we will have the patient continue her hydroxyurea as previously prescribed by her provider in [place].  # Essential thrombocythemia, JAK2 Positive  -- At this time we will need to confirm that the patient has essential thrombocythemia.  She will require a bone marrow biopsy to confirm this diagnosis --JAK2 testing received from patient's prior hematologist. Confirmed JAK2 mutation. Plan:   -- If and when the patient is agreeable we will schedule her for a bone marrow biopsy. --Hydroxyurea was previously held due to GI bleed and anemia.  Anemia is improving with IV iron therapy and therefore we can cotinue hydroxyurea at this time. --discontinue 1500 mg hydroxyurea daily (1000 in PM and 500 in AM)  and transition to 500 mg twice daily. -- Labs today show WBC 3.8, heme globin 10.8, MCV 115.6, and platelets of 146 --continue with labs q 2 weeks followed by a clinic visit in 6 weeks.  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   --Patient has history of GI bleeding. Patient notes no active maroon/red/black stools.  Appreciate assistance of GI in the management of this case. --Labs today as noted above.  --Due to mild anemia and persistent iron deficiency will order another dose of IV Monoferric. --continue to follow with above labs.   No orders of the defined types were placed in this encounter.   All questions were answered. The patient knows to call the clinic with any problems, questions or concerns.  A total of more than 30 minutes were spent on this encounter with face-to-face time and non-face-to-face time, including preparing to see the patient, ordering tests and/or medications, counseling the patient and coordination of care as outlined above.   Ulysees Barns, MD Department of Hematology/Oncology [facility] Phone: [phone] Pager: [phone] Email: [email or web address]  06/20/2023 4:44 PM

## 2023-06-26 ENCOUNTER — Ambulatory Visit (INDEPENDENT_AMBULATORY_CARE_PROVIDER_SITE_OTHER): Payer: Medicare Other

## 2023-06-26 VITALS — BP 92/58 | HR 71 | Temp 98.1°F | Resp 16 | Ht 66.0 in | Wt 170.0 lb

## 2023-06-26 DIAGNOSIS — K5520 Angiodysplasia of colon without hemorrhage: Secondary | ICD-10-CM | POA: Diagnosis not present

## 2023-06-26 DIAGNOSIS — D500 Iron deficiency anemia secondary to blood loss (chronic): Secondary | ICD-10-CM | POA: Diagnosis not present

## 2023-06-26 MED ORDER — SODIUM CHLORIDE 0.9 % IV SOLN
1000.0000 mg | Freq: Once | INTRAVENOUS | Status: AC
  Administered 2023-06-26: 1000 mg via INTRAVENOUS
  Filled 2023-06-26: qty 10

## 2023-06-26 NOTE — Progress Notes (Signed)
Diagnosis: Iron Deficiency Anemia  Provider:  Chilton Greathouse MD  Procedure: IV Infusion  IV Type: Peripheral, IV Location: L Hand  Monoferric (Ferric Derisomaltose), Dose: 1000 mg  Infusion Start Time: 1455  Infusion Stop Time: 1518  Post Infusion IV Care: Patient declined observation and Peripheral IV Discontinued  Discharge: Condition: Good, Destination: Home . AVS Declined  Performed by:  Adriana Mccallum, RN

## 2023-06-26 NOTE — Patient Instructions (Signed)

## 2023-07-03 ENCOUNTER — Other Ambulatory Visit: Payer: Self-pay | Admitting: Nurse Practitioner

## 2023-07-03 ENCOUNTER — Inpatient Hospital Stay: Payer: Medicare Other | Attending: Hematology and Oncology

## 2023-07-03 ENCOUNTER — Telehealth: Payer: Self-pay | Admitting: *Deleted

## 2023-07-03 DIAGNOSIS — Z79899 Other long term (current) drug therapy: Secondary | ICD-10-CM | POA: Diagnosis not present

## 2023-07-03 DIAGNOSIS — D500 Iron deficiency anemia secondary to blood loss (chronic): Secondary | ICD-10-CM

## 2023-07-03 DIAGNOSIS — D473 Essential (hemorrhagic) thrombocythemia: Secondary | ICD-10-CM | POA: Diagnosis present

## 2023-07-03 DIAGNOSIS — D509 Iron deficiency anemia, unspecified: Secondary | ICD-10-CM | POA: Diagnosis present

## 2023-07-03 LAB — CBC WITH DIFFERENTIAL (CANCER CENTER ONLY)
Abs Immature Granulocytes: 0.01 10*3/uL (ref 0.00–0.07)
Basophils Absolute: 0 10*3/uL (ref 0.0–0.1)
Basophils Relative: 0 %
Eosinophils Absolute: 0 10*3/uL (ref 0.0–0.5)
Eosinophils Relative: 0 %
HCT: 33.4 % — ABNORMAL LOW (ref 36.0–46.0)
Hemoglobin: 11.5 g/dL — ABNORMAL LOW (ref 12.0–15.0)
Immature Granulocytes: 0 %
Lymphocytes Relative: 9 %
Lymphs Abs: 0.5 10*3/uL — ABNORMAL LOW (ref 0.7–4.0)
MCH: 41.5 pg — ABNORMAL HIGH (ref 26.0–34.0)
MCHC: 34.4 g/dL (ref 30.0–36.0)
MCV: 120.6 fL — ABNORMAL HIGH (ref 80.0–100.0)
Monocytes Absolute: 0.4 10*3/uL (ref 0.1–1.0)
Monocytes Relative: 8 %
Neutro Abs: 4.3 10*3/uL (ref 1.7–7.7)
Neutrophils Relative %: 83 %
Platelet Count: 383 10*3/uL (ref 150–400)
RBC: 2.77 MIL/uL — ABNORMAL LOW (ref 3.87–5.11)
RDW: 14.3 % (ref 11.5–15.5)
WBC Count: 5.3 10*3/uL (ref 4.0–10.5)
nRBC: 0 % (ref 0.0–0.2)

## 2023-07-03 LAB — FERRITIN: Ferritin: 340 ng/mL — ABNORMAL HIGH (ref 11–307)

## 2023-07-03 LAB — CMP (CANCER CENTER ONLY)
ALT: 14 U/L (ref 0–44)
AST: 21 U/L (ref 15–41)
Albumin: 4.3 g/dL (ref 3.5–5.0)
Alkaline Phosphatase: 59 U/L (ref 38–126)
Anion gap: 4 — ABNORMAL LOW (ref 5–15)
BUN: 13 mg/dL (ref 8–23)
CO2: 30 mmol/L (ref 22–32)
Calcium: 9.8 mg/dL (ref 8.9–10.3)
Chloride: 104 mmol/L (ref 98–111)
Creatinine: 0.76 mg/dL (ref 0.44–1.00)
GFR, Estimated: >60 mL/min (ref >60)
Glucose, Bld: 98 mg/dL (ref 70–99)
Potassium: 4.3 mmol/L (ref 3.5–5.1)
Sodium: 138 mmol/L (ref 135–145)
Total Bilirubin: 0.4 mg/dL (ref <1.2)
Total Protein: 6.8 g/dL (ref 6.5–8.1)

## 2023-07-03 LAB — RETIC PANEL
Immature Retic Fract: 28.1 % — ABNORMAL HIGH (ref 2.3–15.9)
RBC.: 2.75 MIL/uL — ABNORMAL LOW (ref 3.87–5.11)
Retic Count, Absolute: 58.6 10*3/uL (ref 19.0–186.0)
Retic Ct Pct: 2.1 % (ref 0.4–3.1)
Reticulocyte Hemoglobin: 42.7 pg (ref >27.9)

## 2023-07-03 LAB — IRON AND IRON BINDING CAPACITY (CC-WL,HP ONLY)
Iron: 133 ug/dL (ref 28–170)
Saturation Ratios: 39 % — ABNORMAL HIGH (ref 10.4–31.8)
TIBC: 339 ug/dL (ref 250–450)
UIBC: 206 ug/dL (ref 148–442)

## 2023-07-03 NOTE — Telephone Encounter (Signed)
Patient called. She had labs today. PLT now 383. They were 146 two weeks ago. At that time, she'd decreased hydroxyurea from 1000 mg AM/500 mg PM to 500 mg AM and PM. With the jump in platelet count, she asked if she should go back to alternating those 2 doses - taking 1000 mg AM/500 mg PM one day and 500 mg AM and PM?   Dr. Leonides Schanz informed of patient concern and question.  Contacted patient with Dr. Derek Mound response: I would recommend sticking with the 500 mg BID. If platelets increase above 400 can increase the dosage.   Patient verbalized understanding of current dose recommended by MD. She stated that although she was concerned about the quick increase, she would have labs on 11/15 and he could see if platelets increased more by then.

## 2023-07-04 ENCOUNTER — Ambulatory Visit: Payer: No Typology Code available for payment source | Admitting: Dermatology

## 2023-07-10 ENCOUNTER — Telehealth: Payer: Self-pay | Admitting: Gastroenterology

## 2023-07-10 NOTE — Telephone Encounter (Signed)
Inbound call from patient requesting a call to discuss diet for AVM. Patient is requesting to know which foods are okay for her to eat. Please advise, thank you.

## 2023-07-11 NOTE — Telephone Encounter (Signed)
PT returned call

## 2023-07-11 NOTE — Telephone Encounter (Signed)
Left message on machine to call back  

## 2023-07-12 NOTE — Telephone Encounter (Signed)
The pt has been advised that there is not special diet she should follow for AVM's.  She has been advised to follow a healthy low fat diet in general.  The pt has been advised of the information and verbalized understanding.

## 2023-07-17 ENCOUNTER — Inpatient Hospital Stay: Payer: Medicare Other

## 2023-07-17 ENCOUNTER — Other Ambulatory Visit: Payer: Self-pay | Admitting: Hematology and Oncology

## 2023-07-17 ENCOUNTER — Telehealth: Payer: Self-pay

## 2023-07-17 DIAGNOSIS — D473 Essential (hemorrhagic) thrombocythemia: Secondary | ICD-10-CM | POA: Diagnosis not present

## 2023-07-17 LAB — CBC WITH DIFFERENTIAL (CANCER CENTER ONLY)
Abs Immature Granulocytes: 0.02 10*3/uL (ref 0.00–0.07)
Basophils Absolute: 0 10*3/uL (ref 0.0–0.1)
Basophils Relative: 1 %
Eosinophils Absolute: 0 10*3/uL (ref 0.0–0.5)
Eosinophils Relative: 0 %
HCT: 34.9 % — ABNORMAL LOW (ref 36.0–46.0)
Hemoglobin: 12 g/dL (ref 12.0–15.0)
Immature Granulocytes: 0 %
Lymphocytes Relative: 13 %
Lymphs Abs: 0.6 10*3/uL — ABNORMAL LOW (ref 0.7–4.0)
MCH: 42 pg — ABNORMAL HIGH (ref 26.0–34.0)
MCHC: 34.4 g/dL (ref 30.0–36.0)
MCV: 122 fL — ABNORMAL HIGH (ref 80.0–100.0)
Monocytes Absolute: 0.5 10*3/uL (ref 0.1–1.0)
Monocytes Relative: 9 %
Neutro Abs: 3.9 10*3/uL (ref 1.7–7.7)
Neutrophils Relative %: 77 %
Platelet Count: 811 10*3/uL — ABNORMAL HIGH (ref 150–400)
RBC: 2.86 MIL/uL — ABNORMAL LOW (ref 3.87–5.11)
RDW: 15.3 % (ref 11.5–15.5)
WBC Count: 5.1 10*3/uL (ref 4.0–10.5)
nRBC: 0 % (ref 0.0–0.2)

## 2023-07-17 LAB — CMP (CANCER CENTER ONLY)
ALT: 17 U/L (ref 0–44)
AST: 24 U/L (ref 15–41)
Albumin: 4.4 g/dL (ref 3.5–5.0)
Alkaline Phosphatase: 58 U/L (ref 38–126)
Anion gap: 4 — ABNORMAL LOW (ref 5–15)
BUN: 11 mg/dL (ref 8–23)
CO2: 29 mmol/L (ref 22–32)
Calcium: 10.3 mg/dL (ref 8.9–10.3)
Chloride: 107 mmol/L (ref 98–111)
Creatinine: 0.8 mg/dL (ref 0.44–1.00)
GFR, Estimated: >60 mL/min (ref >60)
Glucose, Bld: 101 mg/dL — ABNORMAL HIGH (ref 70–99)
Potassium: 4.2 mmol/L (ref 3.5–5.1)
Sodium: 140 mmol/L (ref 135–145)
Total Bilirubin: 0.4 mg/dL (ref <1.2)
Total Protein: 6.8 g/dL (ref 6.5–8.1)

## 2023-07-17 LAB — FERRITIN: Ferritin: 144 ng/mL (ref 11–307)

## 2023-07-17 NOTE — Telephone Encounter (Signed)
pt  called for today's lab results stating we need to change her medication   Kristina Standard, MD I agree with her. We need to increase Hydroxyurea to 1000 mg PO nightly (2 pills at night) and 500 mg PO in the AM (1 pill in the morning) every day   Pt advised of lab results and recommendations and agreed to the plan of care

## 2023-07-23 ENCOUNTER — Ambulatory Visit: Payer: No Typology Code available for payment source | Admitting: Dermatology

## 2023-08-01 ENCOUNTER — Inpatient Hospital Stay: Payer: Medicare Other | Attending: Hematology and Oncology

## 2023-08-01 ENCOUNTER — Inpatient Hospital Stay (HOSPITAL_BASED_OUTPATIENT_CLINIC_OR_DEPARTMENT_OTHER): Payer: Medicare Other | Admitting: Hematology and Oncology

## 2023-08-01 VITALS — BP 128/60 | HR 68 | Temp 97.9°F | Resp 17

## 2023-08-01 DIAGNOSIS — Z87891 Personal history of nicotine dependence: Secondary | ICD-10-CM | POA: Insufficient documentation

## 2023-08-01 DIAGNOSIS — K5520 Angiodysplasia of colon without hemorrhage: Secondary | ICD-10-CM | POA: Diagnosis not present

## 2023-08-01 DIAGNOSIS — D473 Essential (hemorrhagic) thrombocythemia: Secondary | ICD-10-CM | POA: Diagnosis present

## 2023-08-01 DIAGNOSIS — D500 Iron deficiency anemia secondary to blood loss (chronic): Secondary | ICD-10-CM | POA: Insufficient documentation

## 2023-08-01 DIAGNOSIS — Z79899 Other long term (current) drug therapy: Secondary | ICD-10-CM | POA: Diagnosis not present

## 2023-08-01 LAB — CBC WITH DIFFERENTIAL (CANCER CENTER ONLY)
Abs Immature Granulocytes: 0.01 10*3/uL (ref 0.00–0.07)
Basophils Absolute: 0.1 10*3/uL (ref 0.0–0.1)
Basophils Relative: 1 %
Eosinophils Absolute: 0.2 10*3/uL (ref 0.0–0.5)
Eosinophils Relative: 3 %
HCT: 35.7 % — ABNORMAL LOW (ref 36.0–46.0)
Hemoglobin: 11.7 g/dL — ABNORMAL LOW (ref 12.0–15.0)
Immature Granulocytes: 0 %
Lymphocytes Relative: 11 %
Lymphs Abs: 0.6 10*3/uL — ABNORMAL LOW (ref 0.7–4.0)
MCH: 40.8 pg — ABNORMAL HIGH (ref 26.0–34.0)
MCHC: 32.8 g/dL (ref 30.0–36.0)
MCV: 124.4 fL — ABNORMAL HIGH (ref 80.0–100.0)
Monocytes Absolute: 0.5 10*3/uL (ref 0.1–1.0)
Monocytes Relative: 8 %
Neutro Abs: 4.4 10*3/uL (ref 1.7–7.7)
Neutrophils Relative %: 77 %
Platelet Count: 393 10*3/uL (ref 150–400)
RBC: 2.87 MIL/uL — ABNORMAL LOW (ref 3.87–5.11)
RDW: 14.3 % (ref 11.5–15.5)
WBC Count: 5.8 10*3/uL (ref 4.0–10.5)
nRBC: 0 % (ref 0.0–0.2)

## 2023-08-01 LAB — CMP (CANCER CENTER ONLY)
ALT: 18 U/L (ref 0–44)
AST: 24 U/L (ref 15–41)
Albumin: 4.3 g/dL (ref 3.5–5.0)
Alkaline Phosphatase: 59 U/L (ref 38–126)
Anion gap: 3 — ABNORMAL LOW (ref 5–15)
BUN: 14 mg/dL (ref 8–23)
CO2: 31 mmol/L (ref 22–32)
Calcium: 10.1 mg/dL (ref 8.9–10.3)
Chloride: 107 mmol/L (ref 98–111)
Creatinine: 0.75 mg/dL (ref 0.44–1.00)
GFR, Estimated: >60 mL/min (ref >60)
Glucose, Bld: 105 mg/dL — ABNORMAL HIGH (ref 70–99)
Potassium: 4.3 mmol/L (ref 3.5–5.1)
Sodium: 141 mmol/L (ref 135–145)
Total Bilirubin: 0.3 mg/dL (ref <1.2)
Total Protein: 6.6 g/dL (ref 6.5–8.1)

## 2023-08-01 LAB — FERRITIN: Ferritin: 57 ng/mL (ref 11–307)

## 2023-08-01 NOTE — Progress Notes (Signed)
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 Leonides Schanz  10/26/2021: WBC 6.7, Hgb 13.3, MCV 121.7, Plt 204 03/10/2022: WBC 3.7, Hgb 11.2, MCV 120, Plt 261. Decrease hydroxyurea to 1000 mg BID due to cytopenias.  06/24/2022-06/27/2022: Admitted with worsening GI bleed. Hydroxyurea held.  08/01/2022: WBC 4.4, hemoglobin 9.8, MCV 105.1, and platelets of 190 02/26/2023: WBC 7.3, Hgb 12.5, MCV 109.1, Plt 807. Increase hydroxyurea to 1000 mg daily with 1500 mg every other day 04/27/2023: WBC 4.8, Hgb 12.5, MCV 111.3, Plt 528. Increase hydroxyurea to 1500 mg daily 06/20/2023: Return dose to hydroxyurea 500 mg twice daily 07/17/2023: increased to1500 mg hydroxyurea daily (1000 in PM and 500 in AM)   Interval History:  Sharane Deibel 72 y.o. female with medical history significant for essential thrombocytosis who presents for a follow up visit. The patient's last visit was on 06/20/2023. In the interim since the last visit Ms. Cotterill has continued 1500 mg hydroxyurea daily (1000 in PM and 500 in AM).    On exam today Mrs. Khalsa reports she has been well overall in the interim since her last visit.  She reports her energy levels are good though she can be "tired at the end of the day".  She reports that she has not had any overt signs of bleeding, bruising, or dark stools.  She reports that sometimes if her stool is dark she thinks it may have to do with the food she is eating such as blueberries.  She reports that she is having loose stools which she attributes to her ileocecal valve.  She  does not notice a difference with the change in her hydroxyurea.  She reports she is taking it faithfully 1000 g in the morning and 5 mg at night.  Is not causing any mouth sores or ankle sores.  She is not taking any anticoagulation due to her history of GI bleeds.  She reports that she has been walking quite a lot.  She notes that she did have a cold recently and has a residual cough but otherwise has not had any recent illnesses.  Overall she feels well with no questions concerns or complaints today.  She denies any fevers, chills, sweats, nausea, vomiting or diarrhea.  A full 10 point ROS is listed below.   MEDICAL HISTORY:  Past Medical History:  Diagnosis Date   ABLA (acute blood loss anemia) 04/16/2022   Congenital malformation of intestinal fixation    Gastritis    Gastroesophageal reflux disease without esophagitis 09/10/2021   IDA (iron deficiency anemia)    Mitral valve prolapse    Thrombocytosis     SURGICAL HISTORY: Past Surgical History:  Procedure Laterality Date   BIOPSY  06/26/2022   Procedure: BIOPSY;  Surgeon: Jenel Lucks, MD;  Location: [facility];  Service: Gastroenterology;;   COLONOSCOPY N/A 04/17/2022   Procedure: COLONOSCOPY;  Surgeon: Beverley Fiedler, MD;  Location: [facility];  Service: Gastroenterology;  Laterality: N/A;   COLONOSCOPY WITH PROPOFOL N/A 06/26/2022   Procedure: COLONOSCOPY WITH PROPOFOL;  Surgeon: Jenel Lucks, MD;  Location: [facility];  Service: Gastroenterology;  Laterality:  N/A;   ENTEROSCOPY N/A 04/17/2022   Procedure: ENTEROSCOPY;  Surgeon: Beverley Fiedler, MD;  Location: [facility];  Service: Gastroenterology;  Laterality: N/A;   ENTEROSCOPY N/A 06/26/2022   Procedure: ENTEROSCOPY;  Surgeon: Jenel Lucks, MD;  Location: [facility];  Service: Gastroenterology;  Laterality: N/A;   GIVENS CAPSULE STUDY N/A 06/26/2022   Procedure: GIVENS CAPSULE STUDY;  Surgeon: Jenel Lucks, MD;  Location: [facility];   Service: Gastroenterology;  Laterality: N/A;   HEMOSTASIS CLIP PLACEMENT  04/17/2022   Procedure: HEMOSTASIS CLIP PLACEMENT;  Surgeon: Beverley Fiedler, MD;  Location: [facility];  Service: Gastroenterology;;   HEMOSTASIS CLIP PLACEMENT  06/26/2022   Procedure: HEMOSTASIS CLIP PLACEMENT;  Surgeon: Jenel Lucks, MD;  Location: [facility];  Service: Gastroenterology;;   HOT HEMOSTASIS N/A 04/17/2022   Procedure: HOT HEMOSTASIS (ARGON PLASMA COAGULATION/BICAP);  Surgeon: Beverley Fiedler, MD;  Location: [facility];  Service: Gastroenterology;  Laterality: N/A;   HOT HEMOSTASIS N/A 06/26/2022   Procedure: HOT HEMOSTASIS (ARGON PLASMA COAGULATION/BICAP);  Surgeon: Jenel Lucks, MD;  Location: [facility];  Service: Gastroenterology;  Laterality: N/A;   POLYPECTOMY  04/17/2022   Procedure: POLYPECTOMY;  Surgeon: Beverley Fiedler, MD;  Location: [facility];  Service: Gastroenterology;;   POLYPECTOMY  06/26/2022   Procedure: POLYPECTOMY;  Surgeon: Jenel Lucks, MD;  Location: [facility];  Service: Gastroenterology;;   SMALL INTESTINE SURGERY  1994   SUBMUCOSAL TATTOO INJECTION  06/26/2022   Procedure: SUBMUCOSAL TATTOO INJECTION;  Surgeon: Jenel Lucks, MD;  Location: [facility];  Service: Gastroenterology;;    SOCIAL HISTORY: Social History   Socioeconomic History   Marital status: Married    Spouse name: Not on file   Number of children: 0   Years of education: Not on file   Highest education level: Not on file  Occupational History   Not on file  Tobacco Use   Smoking status: Former    Types: Cigarettes   Smokeless tobacco: Never  Vaping Use   Vaping status: Never Used  Substance and Sexual Activity   Alcohol use: Not Currently   Drug use: Never   Sexual activity: Yes    Partners: Male  Other Topics Concern   Not on file  Social History Narrative   Not on file   Social Determinants of Health   Financial Resource Strain: Not on file  Food  Insecurity: No Food Insecurity (06/24/2022)   Hunger Vital Sign    Worried About Running Out of Food in the Last Year: Never true    Ran Out of Food in the Last Year: Never true  Transportation Needs: No Transportation Needs (06/24/2022)   PRAPARE - Administrator, Civil Service (Medical): No    Lack of Transportation (Non-Medical): No  Physical Activity: Not on file  Stress: Not on file  Social Connections: Not on file  Intimate Partner Violence: Not At Risk (06/24/2022)   Humiliation, Afraid, Rape, and Kick questionnaire    Fear of Current or Ex-Partner: No    Emotionally Abused: No    Physically Abused: No    Sexually Abused: No    FAMILY HISTORY: Family History  Problem Relation Age of Onset   Heart disease Mother    Valvular heart disease Mother    Healthy Sister    Alcoholism Half-Sister     ALLERGIES:  is allergic to aspirin and sulfa antibiotics.  MEDICATIONS:  Current Outpatient Medications  Medication Sig Dispense Refill   acetaminophen (  TYLENOL) 500 MG tablet Take 1,000 mg by mouth every 6 (six) hours as needed for mild pain or headache.     Ascorbic Acid (VITAMIN C) 1000 MG tablet Take 500 mg by mouth in the morning and at bedtime.     chlorhexidine (PERIDEX) 0.12 % solution 5 mLs by Mouth Rinse route as needed.     cholecalciferol (VITAMIN D3) 25 MCG (1000 UNIT) tablet Take 1,000 Units by mouth daily.     cholestyramine light (PREVALITE) 4 g packet Take 1 packet (4 g total) by mouth 2 (two) times daily. 180 each 3   famotidine (PEPCID) 40 MG tablet Take 1 tablet (40 mg total) by mouth daily. 90 tablet 0   folic acid (FOLVITE) 1 MG tablet Take 1 tablet (1 mg total) by mouth daily. 90 tablet 1   glucosamine-chondroitin 500-400 MG tablet Take 1 tablet by mouth 2 (two) times daily.     hydroxyurea (HYDREA) 500 MG capsule Take 1 capsule (500 mg total) by mouth 2 (two) times daily. 180 capsule 1   metoprolol tartrate (LOPRESSOR) 25 MG tablet Take 0.5  tablets (12.5 mg total) by mouth 2 (two) times daily. Please call office to schedule an appt for further refills. Thank you 30 tablet 0   Multiple Vitamins-Minerals (MULTIVITAMIN ADULTS 50+) TABS Take 1 tablet by mouth daily.     Multiple Vitamins-Minerals (PRESERVISION AREDS 2+MULTI VIT PO) Take 1 capsule by mouth 2 (two) times daily.     sucralfate (CARAFATE) 1 g tablet Take 1 tablet (1 g total) by mouth 4 (four) times daily -  with meals and at bedtime. 360 tablet 1   tretinoin (RETIN-A) 0.05 % cream Apply topically at bedtime. Apply a pea sized amount mixed with a quarter sized amount to the affected area three nights a week 45 g 2   No current facility-administered medications for this visit.    REVIEW OF SYSTEMS:   Constitutional: ( - ) fevers, ( - )  chills , ( - ) night sweats Eyes: ( - ) blurriness of vision, ( - ) double vision, ( - ) watery eyes Ears, nose, mouth, throat, and face: ( - ) mucositis, ( - ) sore throat Respiratory: ( - ) cough, ( - ) dyspnea, ( - ) wheezes Cardiovascular: ( - ) palpitation, ( - ) chest discomfort, ( - ) lower extremity swelling Gastrointestinal:  ( - ) nausea, ( - ) heartburn, ( - ) change in bowel habits Skin: ( - ) abnormal skin rashes Lymphatics: ( - ) new lymphadenopathy, ( - ) easy bruising Neurological: ( - ) numbness, ( - ) tingling, ( - ) new weaknesses Behavioral/Psych: ( - ) mood change, ( - ) new changes  All other systems were reviewed with the patient and are negative.  PHYSICAL EXAMINATION:  Vitals:   08/01/23 0920  BP: 128/60  Pulse: 68  Resp: 17  Temp: 97.9 F (36.6 C)  SpO2: 100%    GENERAL: well appearing elderly Caucasian female, in NAD  SKIN: skin color, texture, turgor are normal, no rashes or significant lesions EYES: conjunctiva are pink and non-injected, sclera clear LUNGS: clear to auscultation and percussion with normal breathing effort HEART: regular rate & rhythm and no murmurs and no lower extremity  edema Musculoskeletal: no cyanosis of digits and no clubbing  PSYCH: alert & oriented x 3, fluent speech NEURO: no focal motor/sensory deficits   LABORATORY DATA:  I have reviewed the data as listed  Latest Ref Rng & Units 08/01/2023    9:05 AM 07/17/2023   11:04 AM 07/03/2023   11:05 AM  CBC  WBC 4.0 - 10.5 K/uL 5.8  5.1  5.3   Hemoglobin 12.0 - 15.0 g/dL 16.1  09.6  04.5   Hematocrit 36.0 - 46.0 % 35.7  34.9  33.4   Platelets 150 - 400 K/uL 393  811  383        Latest Ref Rng & Units 08/01/2023    9:05 AM 07/17/2023   11:04 AM 07/03/2023   11:05 AM  CMP  Glucose 70 - 99 mg/dL 409  811  98   BUN 8 - 23 mg/dL 14  11  13    Creatinine 0.44 - 1.00 mg/dL 9.14  7.82  9.56   Sodium 135 - 145 mmol/L 141  140  138   Potassium 3.5 - 5.1 mmol/L 4.3  4.2  4.3   Chloride 98 - 111 mmol/L 107  107  104   CO2 22 - 32 mmol/L 31  29  30    Calcium 8.9 - 10.3 mg/dL 21.3  08.6  9.8   Total Protein 6.5 - 8.1 g/dL 6.6  6.8  6.8   Total Bilirubin <1.2 mg/dL 0.3  0.4  0.4   Alkaline Phos 38 - 126 U/L 59  58  59   AST 15 - 41 U/L 24  24  21    ALT 0 - 44 U/L 18  17  14     RADIOGRAPHIC STUDIES: No results found.  ASSESSMENT & PLAN Comfort Emde 72 y.o. female with medical history significant for essential thrombocytosis who presents for a follow up visit.   After review of the labs, review of the records, and discussion with the patient the patients findings are most consistent with thrombocytosis, reported due to essential thrombocythemia. We have received the records showing that she has a JAK2 mutation.  Additionally she will require a bone marrow biopsy in order to assure that essential thrombocythemia is the diagnosis.  In the interim we will have the patient continue her hydroxyurea as previously prescribed by her provider in [place].  # Essential thrombocythemia, JAK2 Positive  -- At this time we will need to confirm that the patient has essential thrombocythemia.  She will require a  bone marrow biopsy to confirm this diagnosis --JAK2 testing received from patient's prior hematologist. Confirmed JAK2 mutation. Plan:   -- If and when the patient is agreeable we will schedule her for a bone marrow biopsy. --Hydroxyurea was previously held due to GI bleed and anemia.  Anemia is improving with IV iron therapy and therefore we can cotinue hydroxyurea at this time. --continue 1500 mg hydroxyurea daily (1000 in PM and 500 in AM)  -- Labs today show WBC 5.8, hemoglobin 1.7, MCV 124.4, platelets 393 --continue with labs q 2 weeks followed by a clinic visit in 6 weeks.  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  Or recent doses were performed on 11/07/2022, 01/18/2023, and most recently 06/26/2023. --Patient has history of GI bleeding. Patient notes no active maroon/red/black stools.  Appreciate assistance of GI in the management of this case. --Labs today as noted above.  -- If the patient were to have recurrent anemia or persistent iron deficiency will order another dose of IV Monoferric. --continue to follow with  above labs.   No orders of the defined types were placed in this encounter.   All questions were answered. The patient knows to call the clinic with any problems, questions or concerns.  A total of more than 30 minutes were spent on this encounter with face-to-face time and non-face-to-face time, including preparing to see the patient, ordering tests and/or medications, counseling the patient and coordination of care as outlined above.   Ulysees Barns, MD Department of Hematology/Oncology [facility] Phone: [phone] Pager: [phone] Email: [email or web address]  08/01/2023 4:27 PM

## 2023-08-09 ENCOUNTER — Telehealth: Payer: Self-pay | Admitting: Internal Medicine

## 2023-08-09 ENCOUNTER — Encounter: Payer: Self-pay | Admitting: Emergency Medicine

## 2023-08-09 ENCOUNTER — Ambulatory Visit: Payer: Medicare Other | Admitting: Emergency Medicine

## 2023-08-09 VITALS — BP 108/68 | HR 90 | Temp 98.1°F | Ht 66.0 in

## 2023-08-09 DIAGNOSIS — R519 Headache, unspecified: Secondary | ICD-10-CM | POA: Insufficient documentation

## 2023-08-09 DIAGNOSIS — J0100 Acute maxillary sinusitis, unspecified: Secondary | ICD-10-CM | POA: Diagnosis not present

## 2023-08-09 DIAGNOSIS — R0981 Nasal congestion: Secondary | ICD-10-CM | POA: Insufficient documentation

## 2023-08-09 MED ORDER — AMOXICILLIN-POT CLAVULANATE 875-125 MG PO TABS: 1.0000 | ORAL_TABLET | Freq: Two times a day (BID) | ORAL | 0 refills | Status: AC

## 2023-08-09 NOTE — Patient Instructions (Signed)

## 2023-08-09 NOTE — Assessment & Plan Note (Signed)
Recommend to use saline nasal sprays frequently during the day Avoid oral decongestants Avoid antihistamines

## 2023-08-09 NOTE — Telephone Encounter (Signed)
I saw it.  I am not concerned with that.  Over-the-counter cold sore remedies will do.  Thanks.

## 2023-08-09 NOTE — Progress Notes (Signed)
Kristina Osborne 72 y.o.   Chief Complaint  Patient presents with   Sinus Problem    Started the weekend before Thanksgiving, mostly sinus, pressure on the face, cough, sore throat. Headaches. No fever no body aches. Patient also mentions biting her lip on accident and it turned into a sore. Has been taking OTC but has not helped    HISTORY OF PRESENT ILLNESS: Acute problem visit today.  Patient of Dr. Oliver Barre. This is a 71 y.o. female complaining of flulike symptoms that started about 3 weeks ago, not going away, complaining of sinus pressure, pressure headaches, persistent cough, sore throat.  No other associated symptoms No other complaints or medical concerns today  HPI   Prior to Admission medications   Medication Sig Start Date End Date Taking? Authorizing Provider  acetaminophen (TYLENOL) 500 MG tablet Take 1,000 mg by mouth every 6 (six) hours as needed for mild pain or headache.   Yes [provider]  Ascorbic Acid (VITAMIN C) 1000 MG tablet Take 500 mg by mouth in the morning and at bedtime.   Yes [provider]  chlorhexidine (PERIDEX) 0.12 % solution 5 mLs by Mouth Rinse route as needed. 05/29/22  Yes [provider]  cholecalciferol (VITAMIN D3) 25 MCG (1000 UNIT) tablet Take 1,000 Units by mouth daily.   Yes [provider]  cholestyramine light (PREVALITE) 4 g packet Take 1 packet (4 g total) by mouth 2 (two) times daily. 07/17/22  Yes Corwin Levins, MD  famotidine (PEPCID) 40 MG tablet Take 1 tablet (40 mg total) by mouth daily. 05/22/23  Yes Jaci Standard, MD  folic acid (FOLVITE) 1 MG tablet Take 1 tablet (1 mg total) by mouth daily. 05/22/23  Yes Jaci Standard, MD  glucosamine-chondroitin 500-400 MG tablet Take 1 tablet by mouth 2 (two) times daily.   Yes [provider]  hydroxyurea (HYDREA) 500 MG capsule Take 1 capsule (500 mg total) by mouth 2 (two) times daily. 05/22/23  Yes Jaci Standard, MD  metoprolol  tartrate (LOPRESSOR) 25 MG tablet Take 0.5 tablets (12.5 mg total) by mouth 2 (two) times daily. Please call office to schedule an appt for further refills. Thank you 06/01/23  Yes Hilty, Lisette Abu, MD  Multiple Vitamins-Minerals (MULTIVITAMIN ADULTS 50+) TABS Take 1 tablet by mouth daily.   Yes [provider]  Multiple Vitamins-Minerals (PRESERVISION AREDS 2+MULTI VIT PO) Take 1 capsule by mouth 2 (two) times daily.   Yes [provider]  sucralfate (CARAFATE) 1 g tablet Take 1 tablet (1 g total) by mouth 4 (four) times daily -  with meals and at bedtime. 05/22/23 11/18/23 Yes Jaci Standard, MD  tretinoin (RETIN-A) 0.05 % cream Apply topically at bedtime. Apply a pea sized amount mixed with a quarter sized amount to the affected area three nights a week 12/25/22 12/25/23 Yes Terri Piedra, DO    Allergies  Allergen Reactions   Aspirin Other (See Comments)    "Due to intestinal issue"   Sulfa Antibiotics Other (See Comments)    bleeding    Patient Active Problem List   Diagnosis Date Noted   Macrocytic anemia 06/25/2022   GI bleed 06/24/2022   Iron deficiency anemia    Heme positive stool    Jejunal polyp    Benign neoplasm of colon    ABLA (acute blood loss anemia) 04/16/2022   Symptomatic anemia 04/16/2022   GI bleeding 04/15/2022   Right elbow  pain 11/06/2021   Palpitations 11/06/2021   Cervical radiculitis 11/02/2021   Need for vaccination 09/10/2021   AVM (arteriovenous malformation) of small bowel, 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   MVP (mitral valve prolapse) 09/10/2021   Gastroesophageal reflux disease without esophagitis 09/10/2021   Acute COVID-19 09/07/2021   Visit for screening mammogram 09/07/2021   Encounter for general adult medical examination with abnormal findings 09/07/2021   Thrombocytosis 11/12/2019    Past Medical History:  Diagnosis Date   ABLA (acute blood loss anemia) 04/16/2022   Congenital malformation of intestinal fixation    Gastritis     Gastroesophageal reflux disease without esophagitis 09/10/2021   IDA (iron deficiency anemia)    Mitral valve prolapse    Thrombocytosis     Past Surgical History:  Procedure Laterality Date   BIOPSY  06/26/2022   Procedure: BIOPSY;  Surgeon: Jenel Lucks, MD;  Location: [facility];  Service: Gastroenterology;;   COLONOSCOPY N/A 04/17/2022   Procedure: COLONOSCOPY;  Surgeon: Beverley Fiedler, MD;  Location: [facility];  Service: Gastroenterology;  Laterality: N/A;   COLONOSCOPY WITH PROPOFOL N/A 06/26/2022   Procedure: COLONOSCOPY WITH PROPOFOL;  Surgeon: Jenel Lucks, MD;  Location: [facility];  Service: Gastroenterology;  Laterality: N/A;   ENTEROSCOPY N/A 04/17/2022   Procedure: ENTEROSCOPY;  Surgeon: Beverley Fiedler, MD;  Location: [facility];  Service: Gastroenterology;  Laterality: N/A;   ENTEROSCOPY N/A 06/26/2022   Procedure: ENTEROSCOPY;  Surgeon: Jenel Lucks, MD;  Location: [facility];  Service: Gastroenterology;  Laterality: N/A;   GIVENS CAPSULE STUDY N/A 06/26/2022   Procedure: GIVENS CAPSULE STUDY;  Surgeon: Jenel Lucks, MD;  Location: [facility];  Service: Gastroenterology;  Laterality: N/A;   HEMOSTASIS CLIP PLACEMENT  04/17/2022   Procedure: HEMOSTASIS CLIP PLACEMENT;  Surgeon: Beverley Fiedler, MD;  Location: [facility];  Service: Gastroenterology;;   HEMOSTASIS CLIP PLACEMENT  06/26/2022   Procedure: HEMOSTASIS CLIP PLACEMENT;  Surgeon: Jenel Lucks, MD;  Location: [facility];  Service: Gastroenterology;;   HOT HEMOSTASIS N/A 04/17/2022   Procedure: HOT HEMOSTASIS (ARGON PLASMA COAGULATION/BICAP);  Surgeon: Beverley Fiedler, MD;  Location: [facility];  Service: Gastroenterology;  Laterality: N/A;   HOT HEMOSTASIS N/A 06/26/2022   Procedure: HOT HEMOSTASIS (ARGON PLASMA COAGULATION/BICAP);  Surgeon: Jenel Lucks, MD;  Location: [facility];  Service: Gastroenterology;  Laterality: N/A;   POLYPECTOMY  04/17/2022    Procedure: POLYPECTOMY;  Surgeon: Beverley Fiedler, MD;  Location: [facility];  Service: Gastroenterology;;   POLYPECTOMY  06/26/2022   Procedure: POLYPECTOMY;  Surgeon: Jenel Lucks, MD;  Location: [facility];  Service: Gastroenterology;;   SMALL INTESTINE SURGERY  1994   SUBMUCOSAL TATTOO INJECTION  06/26/2022   Procedure: SUBMUCOSAL TATTOO INJECTION;  Surgeon: Jenel Lucks, MD;  Location: [facility];  Service: Gastroenterology;;    Social History   Socioeconomic History   Marital status: Married    Spouse name: Not on file   Number of children: 0   Years of education: Not on file   Highest education level: Not on file  Occupational History   Not on file  Tobacco Use   Smoking status: Former    Types: Cigarettes   Smokeless tobacco: Never  Vaping Use   Vaping status: Never Used  Substance and Sexual Activity   Alcohol use: Not Currently   Drug use: Never   Sexual activity: Yes    Partners: Male  Other Topics Concern   Not on file  Social History Narrative  Not on file   Social Drivers of Health   Financial Resource Strain: Not on file  Food Insecurity: No Food Insecurity (06/24/2022)   Hunger Vital Sign    Worried About Running Out of Food in the Last Year: Never true    Ran Out of Food in the Last Year: Never true  Transportation Needs: No Transportation Needs (06/24/2022)   PRAPARE - Administrator, Civil Service (Medical): No    Lack of Transportation (Non-Medical): No  Physical Activity: Not on file  Stress: Not on file  Social Connections: Not on file  Intimate Partner Violence: Not At Risk (06/24/2022)   Humiliation, Afraid, Rape, and Kick questionnaire    Fear of Current or Ex-Partner: No    Emotionally Abused: No    Physically Abused: No    Sexually Abused: No    Family History  Problem Relation Age of Onset   Heart disease Mother    Valvular heart disease Mother    Healthy Sister    Alcoholism Half-Sister       Review of Systems  Constitutional: Negative.  Negative for chills and fever.  HENT:  Positive for congestion and sinus pain. Negative for sore throat.        Cold sores  Respiratory:  Positive for cough. Negative for shortness of breath.   Cardiovascular: Negative.  Negative for chest pain and palpitations.  Gastrointestinal:  Negative for abdominal pain, diarrhea, nausea and vomiting.  Genitourinary: Negative.  Negative for dysuria and hematuria.  Skin: Negative.  Negative for rash.  Neurological: Negative.  Negative for dizziness and headaches.  All other systems reviewed and are negative.   Vitals:   08/09/23 1447  BP: 108/68  Pulse: 90  Temp: 98.1 F (36.7 C)  SpO2: 98%    Physical Exam Vitals reviewed.  Constitutional:      Appearance: Normal appearance.  HENT:     Head: Normocephalic.     Right Ear: Tympanic membrane, ear canal and external ear normal.     Left Ear: Tympanic membrane, ear canal and external ear normal.     Mouth/Throat:     Mouth: Mucous membranes are moist.     Pharynx: Oropharynx is clear.     Comments: Cold sore to inner right lower gum area and right lower outer lip Eyes:     Extraocular Movements: Extraocular movements intact.     Conjunctiva/sclera: Conjunctivae normal.     Pupils: Pupils are equal, round, and reactive to light.  Cardiovascular:     Rate and Rhythm: Normal rate and regular rhythm.     Pulses: Normal pulses.     Heart sounds: Normal heart sounds.  Pulmonary:     Effort: Pulmonary effort is normal.     Breath sounds: Normal breath sounds.  Musculoskeletal:     Cervical back: No tenderness.  Lymphadenopathy:     Cervical: No cervical adenopathy.  Skin:    General: Skin is warm and dry.     Capillary Refill: Capillary refill takes less than 2 seconds.  Neurological:     General: No focal deficit present.     Mental Status: She is alert and oriented to person, place, and time.  Psychiatric:        Mood and  Affect: Mood normal.        Behavior: Behavior normal.      ASSESSMENT & PLAN: A total of 32 minutes was spent with the patient and counseling/coordination of care regarding preparing for  this visit, review of most recent office visit notes, review of chronic medical conditions under management, review of all medications, diagnosis of sinus infection and need to start antibiotics, symptom management, prognosis, documentation, and need for follow-up if no better or worse during the next several days.  Problem List Items Addressed This Visit       Respiratory   Acute non-recurrent maxillary sinusitis - Primary   Viral upper respiratory infection now with secondary bacterial infection of sinuses.  Recommend to start Augmentin 875 mg twice a day for 7 days Clinically stable. Advised to rest and stay well-hydrated Advised to contact the office if no better or worse during the next several days      Relevant Medications   amoxicillin-clavulanate (AUGMENTIN) 875-125 MG tablet   Sinus congestion   Recommend to use saline nasal sprays frequently during the day Avoid oral decongestants Avoid antihistamines        Other   Sinus headache   Recommend Advil and or Tylenol for headaches Start antibiotics as prescribed Advised to rest and stay well-hydrated      Patient Instructions  Sinus Infection, Adult A sinus infection is soreness and swelling (inflammation) of your sinuses. Sinuses are hollow spaces in the bones around your face. They are located: Around your eyes. In the middle of your forehead. Behind your nose. In your cheekbones. Your sinuses and nasal passages are lined with a fluid called mucus. Mucus drains out of your sinuses. Swelling can trap mucus in your sinuses. This lets germs (bacteria, virus, or fungus) grow, which leads to infection. Most of the time, this condition is caused by a virus. What are the causes? Allergies. Asthma. Germs. Things that block your nose  or sinuses. Growths in the nose (nasal polyps). Chemicals or irritants in the air. A fungus. This is rare. What increases the risk? Having a weak body defense system (immune system). Doing a lot of swimming or diving. Using nasal sprays too much. Smoking. What are the signs or symptoms? The main symptoms of this condition are pain and a feeling of pressure around the sinuses. Other symptoms include: Stuffy nose (congestion). This may make it hard to breathe through your nose. Runny nose (drainage). Soreness, swelling, and warmth in the sinuses. A cough that may get worse at night. Being unable to smell and taste. Mucus that collects in the throat or the back of the nose (postnasal drip). This may cause a sore throat or bad breath. Being very tired (fatigued). A fever. How is this diagnosed? Your symptoms. Your medical history. A physical exam. Tests to find out if your condition is short-term (acute) or long-term (chronic). Your doctor may: Check your nose for growths (polyps). Check your sinuses using a tool that has a light on one end (endoscope). Check for allergies or germs. Do imaging tests, such as an MRI or CT scan. How is this treated? Treatment for this condition depends on the cause and whether it is short-term or long-term. If caused by a virus, your symptoms should go away on their own within 10 days. You may be given medicines to relieve symptoms. They include: Medicines that shrink swollen tissue in the nose. A spray that treats swelling of the nostrils. Rinses that help get rid of thick mucus in your nose (nasal saline washes). Medicines that treat allergies (antihistamines). Over-the-counter pain relievers. If caused by bacteria, your doctor may wait to see if you will get better without treatment. You may be given antibiotic medicine if  you have: A very bad infection. A weak body defense system. If caused by growths in the nose, surgery may be needed. Follow  these instructions at home: Medicines Take, use, or apply over-the-counter and prescription medicines only as told by your doctor. These may include nasal sprays. If you were prescribed an antibiotic medicine, take it as told by your doctor. Do not stop taking it even if you start to feel better. Hydrate and humidify  Drink enough water to keep your pee (urine) pale yellow. Use a cool mist humidifier to keep the humidity level in your home above 50%. Breathe in steam for 10-15 minutes, 3-4 times a day, or as told by your doctor. You can do this in the bathroom while a hot shower is running. Try not to spend time in cool or dry air. Rest Rest as much as you can. Sleep with your head raised (elevated). Make sure you get enough sleep each night. General instructions  Put a warm, moist washcloth on your face 3-4 times a day, or as often as told by your doctor. Use nasal saline washes as often as told by your doctor. Wash your hands often with soap and water. If you cannot use soap and water, use hand sanitizer. Do not smoke. Avoid being around people who are smoking (secondhand smoke). Keep all follow-up visits. Contact a doctor if: You have a fever. Your symptoms get worse. Your symptoms do not get better within 10 days. Get help right away if: You have a very bad headache. You cannot stop vomiting. You have very bad pain or swelling around your face or eyes. You have trouble seeing. You feel confused. Your neck is stiff. You have trouble breathing. These symptoms may be an emergency. Get help right away. Call 911. Do not wait to see if the symptoms will go away. Do not drive yourself to the hospital. Summary A sinus infection is swelling of your sinuses. Sinuses are hollow spaces in the bones around your face. This condition is caused by tissues in your nose that become inflamed or swollen. This traps germs. These can lead to infection. If you were prescribed an antibiotic  medicine, take it as told by your doctor. Do not stop taking it even if you start to feel better. Keep all follow-up visits. This information is not intended to replace advice given to you by your health care provider. Make sure you discuss any questions you have with your health care provider. Document Revised: 07/19/2021 Document Reviewed: 07/19/2021 Elsevier Patient Education  2024 Elsevier Inc.     Edwina Barth, MD [facility]

## 2023-08-09 NOTE — Assessment & Plan Note (Signed)
Recommend Advil and or Tylenol for headaches Start antibiotics as prescribed Advised to rest and stay well-hydrated

## 2023-08-09 NOTE — Assessment & Plan Note (Signed)
Viral upper respiratory infection now with secondary bacterial infection of sinuses.  Recommend to start Augmentin 875 mg twice a day for 7 days Clinically stable. Advised to rest and stay well-hydrated Advised to contact the office if no better or worse during the next several days

## 2023-08-09 NOTE — Telephone Encounter (Signed)
Patient saw Dr. Alvy Bimler today and mentioned that she has a sore in her mouth. She wanted to know what he would recommend to help with it. Patient would like a call back at [phone].

## 2023-08-10 ENCOUNTER — Inpatient Hospital Stay: Payer: Medicare Other

## 2023-08-10 ENCOUNTER — Other Ambulatory Visit: Payer: Self-pay | Admitting: *Deleted

## 2023-08-10 ENCOUNTER — Telehealth: Payer: Self-pay | Admitting: *Deleted

## 2023-08-10 DIAGNOSIS — D500 Iron deficiency anemia secondary to blood loss (chronic): Secondary | ICD-10-CM

## 2023-08-10 DIAGNOSIS — D473 Essential (hemorrhagic) thrombocythemia: Secondary | ICD-10-CM | POA: Diagnosis not present

## 2023-08-10 LAB — CBC WITH DIFFERENTIAL (CANCER CENTER ONLY)
Abs Immature Granulocytes: 0.01 10*3/uL (ref 0.00–0.07)
Basophils Absolute: 0 10*3/uL (ref 0.0–0.1)
Basophils Relative: 1 %
Eosinophils Absolute: 0.2 10*3/uL (ref 0.0–0.5)
Eosinophils Relative: 3 %
HCT: 34 % — ABNORMAL LOW (ref 36.0–46.0)
Hemoglobin: 11.1 g/dL — ABNORMAL LOW (ref 12.0–15.0)
Immature Granulocytes: 0 %
Lymphocytes Relative: 9 %
Lymphs Abs: 0.5 10*3/uL — ABNORMAL LOW (ref 0.7–4.0)
MCH: 40.7 pg — ABNORMAL HIGH (ref 26.0–34.0)
MCHC: 32.6 g/dL (ref 30.0–36.0)
MCV: 124.5 fL — ABNORMAL HIGH (ref 80.0–100.0)
Monocytes Absolute: 0.4 10*3/uL (ref 0.1–1.0)
Monocytes Relative: 8 %
Neutro Abs: 4.1 10*3/uL (ref 1.7–7.7)
Neutrophils Relative %: 79 %
Platelet Count: 187 10*3/uL (ref 150–400)
RBC: 2.73 MIL/uL — ABNORMAL LOW (ref 3.87–5.11)
RDW: 13.8 % (ref 11.5–15.5)
WBC Count: 5.2 10*3/uL (ref 4.0–10.5)
nRBC: 0 % (ref 0.0–0.2)

## 2023-08-10 LAB — CMP (CANCER CENTER ONLY)
ALT: 15 U/L (ref 0–44)
AST: 21 U/L (ref 15–41)
Albumin: 4.1 g/dL (ref 3.5–5.0)
Alkaline Phosphatase: 57 U/L (ref 38–126)
Anion gap: 3 — ABNORMAL LOW (ref 5–15)
BUN: 13 mg/dL (ref 8–23)
CO2: 31 mmol/L (ref 22–32)
Calcium: 9.8 mg/dL (ref 8.9–10.3)
Chloride: 105 mmol/L (ref 98–111)
Creatinine: 0.65 mg/dL (ref 0.44–1.00)
GFR, Estimated: >60 mL/min (ref >60)
Glucose, Bld: 99 mg/dL (ref 70–99)
Potassium: 4.5 mmol/L (ref 3.5–5.1)
Sodium: 139 mmol/L (ref 135–145)
Total Bilirubin: 0.4 mg/dL (ref <1.2)
Total Protein: 6.5 g/dL (ref 6.5–8.1)

## 2023-08-10 LAB — FERRITIN: Ferritin: 36 ng/mL (ref 11–307)

## 2023-08-10 NOTE — Telephone Encounter (Signed)
Saw pt in the lobby to review her CBC from today.HGB is 11.1 Platelets have dropped to 197. Discussed with dr. Leonides Schanz. Plan is to decrease Hydrea to 1 tablet in the morning and a night, except on MWF and pt will take 1 in the morning and 2 at night. Pt instructed on change in her Hydrea dosing. She voiced understanding.  She is aware of her up coming appts.

## 2023-08-15 ENCOUNTER — Telehealth: Payer: Self-pay | Admitting: *Deleted

## 2023-08-15 ENCOUNTER — Inpatient Hospital Stay: Payer: Medicare Other

## 2023-08-15 DIAGNOSIS — D473 Essential (hemorrhagic) thrombocythemia: Secondary | ICD-10-CM

## 2023-08-15 LAB — CBC WITH DIFFERENTIAL (CANCER CENTER ONLY)
Abs Immature Granulocytes: 0.02 10*3/uL (ref 0.00–0.07)
Basophils Absolute: 0 10*3/uL (ref 0.0–0.1)
Basophils Relative: 1 %
Eosinophils Absolute: 0 10*3/uL (ref 0.0–0.5)
Eosinophils Relative: 0 %
HCT: 36.1 % (ref 36.0–46.0)
Hemoglobin: 12.5 g/dL (ref 12.0–15.0)
Immature Granulocytes: 0 %
Lymphocytes Relative: 7 %
Lymphs Abs: 0.4 10*3/uL — ABNORMAL LOW (ref 0.7–4.0)
MCH: 42.2 pg — ABNORMAL HIGH (ref 26.0–34.0)
MCHC: 34.6 g/dL (ref 30.0–36.0)
MCV: 122 fL — ABNORMAL HIGH (ref 80.0–100.0)
Monocytes Absolute: 0.5 10*3/uL (ref 0.1–1.0)
Monocytes Relative: 8 %
Neutro Abs: 5.3 10*3/uL (ref 1.7–7.7)
Neutrophils Relative %: 84 %
Platelet Count: 302 10*3/uL (ref 150–400)
RBC: 2.96 MIL/uL — ABNORMAL LOW (ref 3.87–5.11)
RDW: 13.7 % (ref 11.5–15.5)
WBC Count: 6.2 10*3/uL (ref 4.0–10.5)
nRBC: 0 % (ref 0.0–0.2)

## 2023-08-15 LAB — CMP (CANCER CENTER ONLY)
ALT: 18 U/L (ref 0–44)
AST: 23 U/L (ref 15–41)
Albumin: 4.5 g/dL (ref 3.5–5.0)
Alkaline Phosphatase: 68 U/L (ref 38–126)
Anion gap: 6 (ref 5–15)
BUN: 12 mg/dL (ref 8–23)
CO2: 29 mmol/L (ref 22–32)
Calcium: 10.3 mg/dL (ref 8.9–10.3)
Chloride: 105 mmol/L (ref 98–111)
Creatinine: 0.75 mg/dL (ref 0.44–1.00)
GFR, Estimated: >60 mL/min (ref >60)
Glucose, Bld: 105 mg/dL — ABNORMAL HIGH (ref 70–99)
Potassium: 4.2 mmol/L (ref 3.5–5.1)
Sodium: 140 mmol/L (ref 135–145)
Total Bilirubin: 0.5 mg/dL (ref <1.2)
Total Protein: 6.8 g/dL (ref 6.5–8.1)

## 2023-08-15 LAB — FERRITIN: Ferritin: 37 ng/mL (ref 11–307)

## 2023-08-15 NOTE — Telephone Encounter (Signed)
TCT patient regarding today's labs.  Spoke to her. Reviewed labs with Dr. Leonides Schanz as well. Dr. Leonides Schanz recommends pt staying on current dose of Hydrea of 1 tablet in the morning and 1 at night, except on MWF when she will take 2 tablets at night. Pt voiced understanding.  She is aware of her next appt date and time.

## 2023-08-23 ENCOUNTER — Other Ambulatory Visit: Payer: Self-pay | Admitting: Hematology and Oncology

## 2023-08-31 ENCOUNTER — Other Ambulatory Visit: Payer: Self-pay | Admitting: Hematology and Oncology

## 2023-08-31 ENCOUNTER — Emergency Department (HOSPITAL_BASED_OUTPATIENT_CLINIC_OR_DEPARTMENT_OTHER)
Admission: EM | Admit: 2023-08-31 | Discharge: 2023-08-31 | Disposition: A | Discharge status: Home / Self Care | Payer: Medicare Other | Attending: Emergency Medicine | Admitting: Emergency Medicine

## 2023-08-31 ENCOUNTER — Encounter (HOSPITAL_BASED_OUTPATIENT_CLINIC_OR_DEPARTMENT_OTHER): Payer: Self-pay | Admitting: Emergency Medicine

## 2023-08-31 ENCOUNTER — Telehealth: Payer: Self-pay | Admitting: *Deleted

## 2023-08-31 ENCOUNTER — Inpatient Hospital Stay: Payer: Medicare Other | Attending: Hematology and Oncology

## 2023-08-31 ENCOUNTER — Other Ambulatory Visit: Payer: Self-pay

## 2023-08-31 ENCOUNTER — Emergency Department (HOSPITAL_BASED_OUTPATIENT_CLINIC_OR_DEPARTMENT_OTHER): Payer: Medicare Other

## 2023-08-31 DIAGNOSIS — W19XXXA Unspecified fall, initial encounter: Secondary | ICD-10-CM

## 2023-08-31 DIAGNOSIS — M5030 Other cervical disc degeneration, unspecified cervical region: Secondary | ICD-10-CM | POA: Diagnosis not present

## 2023-08-31 DIAGNOSIS — R519 Headache, unspecified: Secondary | ICD-10-CM | POA: Diagnosis not present

## 2023-08-31 DIAGNOSIS — S1090XA Unspecified superficial injury of unspecified part of neck, initial encounter: Secondary | ICD-10-CM | POA: Diagnosis present

## 2023-08-31 DIAGNOSIS — D500 Iron deficiency anemia secondary to blood loss (chronic): Secondary | ICD-10-CM | POA: Insufficient documentation

## 2023-08-31 DIAGNOSIS — W06XXXA Fall from bed, initial encounter: Secondary | ICD-10-CM | POA: Insufficient documentation

## 2023-08-31 DIAGNOSIS — D473 Essential (hemorrhagic) thrombocythemia: Secondary | ICD-10-CM | POA: Diagnosis not present

## 2023-08-31 DIAGNOSIS — K5520 Angiodysplasia of colon without hemorrhage: Secondary | ICD-10-CM | POA: Insufficient documentation

## 2023-08-31 DIAGNOSIS — S134XXA Sprain of ligaments of cervical spine, initial encounter: Secondary | ICD-10-CM

## 2023-08-31 DIAGNOSIS — S0990XA Unspecified injury of head, initial encounter: Secondary | ICD-10-CM | POA: Diagnosis not present

## 2023-08-31 DIAGNOSIS — Z79899 Other long term (current) drug therapy: Secondary | ICD-10-CM | POA: Insufficient documentation

## 2023-08-31 LAB — CBC WITH DIFFERENTIAL (CANCER CENTER ONLY)
Abs Immature Granulocytes: 0.03 10*3/uL (ref 0.00–0.07)
Basophils Absolute: 0 10*3/uL (ref 0.0–0.1)
Basophils Relative: 1 %
Eosinophils Absolute: 0 10*3/uL (ref 0.0–0.5)
Eosinophils Relative: 0 %
HCT: 36.4 % (ref 36.0–46.0)
Hemoglobin: 12.3 g/dL (ref 12.0–15.0)
Immature Granulocytes: 1 %
Lymphocytes Relative: 18 %
Lymphs Abs: 0.8 10*3/uL (ref 0.7–4.0)
MCH: 41.4 pg — ABNORMAL HIGH (ref 26.0–34.0)
MCHC: 33.8 g/dL (ref 30.0–36.0)
MCV: 122.6 fL — ABNORMAL HIGH (ref 80.0–100.0)
Monocytes Absolute: 0.5 10*3/uL (ref 0.1–1.0)
Monocytes Relative: 12 %
Neutro Abs: 3.1 10*3/uL (ref 1.7–7.7)
Neutrophils Relative %: 68 %
Platelet Count: 844 10*3/uL — ABNORMAL HIGH (ref 150–400)
RBC: 2.97 MIL/uL — ABNORMAL LOW (ref 3.87–5.11)
RDW: 13.3 % (ref 11.5–15.5)
WBC Count: 4.4 10*3/uL (ref 4.0–10.5)
nRBC: 0 % (ref 0.0–0.2)

## 2023-08-31 LAB — CMP (CANCER CENTER ONLY)
ALT: 20 U/L (ref 0–44)
AST: 23 U/L (ref 15–41)
Albumin: 4.3 g/dL (ref 3.5–5.0)
Alkaline Phosphatase: 65 U/L (ref 38–126)
Anion gap: 4 — ABNORMAL LOW (ref 5–15)
BUN: 12 mg/dL (ref 8–23)
CO2: 29 mmol/L (ref 22–32)
Calcium: 9.8 mg/dL (ref 8.9–10.3)
Chloride: 106 mmol/L (ref 98–111)
Creatinine: 0.77 mg/dL (ref 0.44–1.00)
GFR, Estimated: >60 mL/min (ref >60)
Glucose, Bld: 97 mg/dL (ref 70–99)
Potassium: 4 mmol/L (ref 3.5–5.1)
Sodium: 139 mmol/L (ref 135–145)
Total Bilirubin: 0.3 mg/dL (ref 0.0–1.2)
Total Protein: 6.8 g/dL (ref 6.5–8.1)

## 2023-08-31 LAB — FERRITIN: Ferritin: 23 ng/mL (ref 11–307)

## 2023-08-31 MED ORDER — LIDOCAINE 5 % EX PTCH
1.0000 | MEDICATED_PATCH | Freq: Once | CUTANEOUS | Status: DC
  Filled 2023-08-31: qty 1

## 2023-08-31 MED ORDER — LIDOCAINE 5 % EX PTCH: 1.0000 | MEDICATED_PATCH | CUTANEOUS | 0 refills | Status: DC

## 2023-08-31 MED ORDER — CYCLOBENZAPRINE HCL 10 MG PO TABS: 10.0000 mg | ORAL_TABLET | Freq: Two times a day (BID) | ORAL | 0 refills | Status: DC | PRN

## 2023-08-31 NOTE — Telephone Encounter (Signed)
 Received call from pt. She states that she has had 2 falls this week. 1st fall was on New Years Eve by tripping on her wide legged pants leg. She sustained just minor scrapes from that fall. @nd  fall was on NewYears Day and she slipped off the edge of the bed and hit the back of her head quite hard (according to pt) on the hardwood floor. Today she is c/o bilateral neck pain. Denies headache, dizziness or any neurological s/s. Advised that pt should be evaluated at either Urgent care or the ED to make sure all is well. Pt voiced understanding.

## 2023-08-31 NOTE — ED Notes (Signed)

## 2023-08-31 NOTE — ED Triage Notes (Signed)
 Fall on 08/29/2023. Went to sit on bed and missed. Fell onto tailbone and hit back of head. Denies LOC. Reports some continued headache and neck pain

## 2023-08-31 NOTE — Telephone Encounter (Signed)
 TCT patient after reviewing her lab results with Dr. Federico. Spoke with her. Advised that she will need to increase her Hydrea  to 1 tab in the morning and 2 tabs at night every night. She does have another lab appt on 09/12/23. She did go to ED at [facility] to check her neck and head after a recent fall. She states she is ok-Scans and neck are ok.

## 2023-08-31 NOTE — Discharge Instructions (Signed)
 In the emergency department after your fall.  Your CAT scans showed no bleeding in your brain and no broken bones.  You may have a mild concussion and likely have a whiplash injury to your neck with pulled muscles.  You can take Tylenol  every 6 hours as needed for pain as well as use lidocaine  patches, ice or heat.  I have also given you a muscle relaxer to take as needed for breakthrough pain.  This can make you drowsy so do not take before driving, working or operating heavy machinery.  You can follow-up with your primary doctor to have your symptoms rechecked.  You should return to the emergency department if you develop numbness or weakness in your arms, significantly worsening headaches, repetitive vomiting or any other new or concerning symptoms.

## 2023-08-31 NOTE — ED Provider Notes (Signed)
 [place] EMERGENCY DEPARTMENT AT [facility] Provider Note   CSN: [id number] Arrival date & time: 08/31/23  1102     History  Chief Complaint  Patient presents with   Kristina Osborne    Kristina Osborne is a 73 y.o. female.  Patient is a 73 year old female with a past medical history of small bowel AVM and thrombocytosis presenting to the emergency department after a fall.  She states that on New Year's Eve she was walking her dog and flared pains in her shoe got stuck in her pant causing her to fall on her hands and knees.  She states that she has sustained some scrapes but did not hit her head or lose consciousness.  She states that the next morning she was trying to get out of bed and got tangled in the sheets and fell and hit the back of her head on the ground.  She states that she has had bilateral neck pain since then.  She reports only mild headaches.  She denies any nausea, vomiting, vision changes, numbness or weakness.  She states that she has been taking Tylenol  at home with minimal relief.  The history is provided by the patient.  Fall       Home Medications Prior to Admission medications   Medication Sig Start Date End Date Taking? Authorizing Provider  cyclobenzaprine  (FLEXERIL ) 10 MG tablet Take 1 tablet (10 mg total) by mouth 2 (two) times daily as needed for muscle spasms. 08/31/23  Yes Ellouise, Payzlee Ryder K, DO  lidocaine  (LIDODERM ) 5 % Place 1 patch onto the skin daily. Remove & Discard patch within 12 hours or as directed by MD 08/31/23  Yes Ellouise, Nataki Mccrumb K, DO  acetaminophen  (TYLENOL ) 500 MG tablet Take 1,000 mg by mouth every 6 (six) hours as needed for mild pain or headache.    [provider]  Ascorbic Acid (VITAMIN C) 1000 MG tablet Take 500 mg by mouth in the morning and at bedtime.    [provider]  chlorhexidine  (PERIDEX ) 0.12 % solution 5 mLs by Mouth Rinse route as needed. 05/29/22   [provider]  cholecalciferol (VITAMIN  D3) 25 MCG (1000 UNIT) tablet Take 1,000 Units by mouth daily.    [provider]  cholestyramine  light (PREVALITE ) 4 g packet Take 1 packet (4 g total) by mouth 2 (two) times daily. 07/17/22   Norleen Lynwood ORN, MD  famotidine  (PEPCID ) 40 MG tablet TAKE 1 TABLET(40 MG) BY MOUTH DAILY 08/23/23   Dorsey, John T IV, MD  folic acid  (FOLVITE ) 1 MG tablet Take 1 tablet (1 mg total) by mouth daily. 05/22/23   Dorsey, John T IV, MD  glucosamine-chondroitin 500-400 MG tablet Take 1 tablet by mouth 2 (two) times daily.    [provider]  hydroxyurea  (HYDREA ) 500 MG capsule Take 1 capsule (500 mg total) by mouth 2 (two) times daily. 05/22/23   Federico Norleen ONEIDA MADISON, MD  metoprolol  tartrate (LOPRESSOR ) 25 MG tablet Take 0.5 tablets (12.5 mg total) by mouth 2 (two) times daily. Please call office to schedule an appt for further refills. Thank you 06/01/23   Mona Vinie BROCKS, MD  Multiple Vitamins-Minerals (MULTIVITAMIN ADULTS 50+) TABS Take 1 tablet by mouth daily.    [provider]  Multiple Vitamins-Minerals (PRESERVISION AREDS 2+MULTI VIT PO) Take 1 capsule by mouth 2 (two) times daily.    [provider]  sucralfate  (CARAFATE ) 1 g tablet TAKE 1 TABLET(1 GRAM) BY MOUTH FOUR TIMES DAILY AT BEDTIME  WITH MEALS 08/31/23   Dorsey, John T IV, MD  tretinoin  (RETIN-A ) 0.05 % cream Apply topically at bedtime. Apply a pea sized amount mixed with a quarter sized amount to the affected area three nights a week 12/25/22 12/25/23  Alm Delon SAILOR, DO      Allergies    Aspirin and Sulfa antibiotics    Review of Systems   Review of Systems  Physical Exam Updated Vital Signs BP 132/76 (BP Location: Right Arm)   Pulse 89   Temp 98.1 F (36.7 C) (Oral)   Resp 18   LMP  (LMP Unknown)   SpO2 100%  Physical Exam Vitals and nursing note reviewed.  Constitutional:      General: She is not in acute distress.    Appearance: Normal appearance.  HENT:     Head: Normocephalic and atraumatic.      Nose: Nose normal.     Mouth/Throat:     Mouth: Mucous membranes are moist.     Pharynx: Oropharynx is clear.  Eyes:     Extraocular Movements: Extraocular movements intact.     Conjunctiva/sclera: Conjunctivae normal.     Pupils: Pupils are equal, round, and reactive to light.  Neck:     Comments: No midline neck tenderness, bilateral cervical paraspinal muscle and trapezius muscle tenderness to palpation Cardiovascular:     Rate and Rhythm: Normal rate and regular rhythm.     Heart sounds: Normal heart sounds.  Pulmonary:     Effort: Pulmonary effort is normal.     Breath sounds: Normal breath sounds.  Abdominal:     General: Abdomen is flat.     Tenderness: There is no abdominal tenderness.  Musculoskeletal:        General: Normal range of motion.     Cervical back: Normal range of motion and neck supple.     Comments: No midline back tenderness No bony tenderness to bilateral upper or lower extremities  Skin:    General: Skin is warm and dry.     Comments: Healing appearing abrasions to left palm and right knee, no surrounding erythema, warmth or drainage  Neurological:     General: No focal deficit present.     Mental Status: She is alert and oriented to person, place, and time.     Sensory: No sensory deficit.     Motor: No weakness.  Psychiatric:        Mood and Affect: Mood normal.        Behavior: Behavior normal.     ED Results / Procedures / Treatments   Labs (all labs ordered are listed, but only abnormal results are displayed) Labs Reviewed - No data to display  EKG None  Radiology CT Head Wo Contrast Result Date: 08/31/2023 CLINICAL DATA:  Fall 2 days ago headache and neck pain EXAM: CT HEAD WITHOUT CONTRAST CT CERVICAL SPINE WITHOUT CONTRAST TECHNIQUE: Multidetector CT imaging of the head and cervical spine was performed following the standard protocol without intravenous contrast. Multiplanar CT image reconstructions of the cervical spine were also  generated. RADIATION DOSE REDUCTION: This exam was performed according to the departmental dose-optimization program which includes automated exposure control, adjustment of the mA and/or kV according to patient size and/or use of iterative reconstruction technique. COMPARISON:  05/23/2023 FINDINGS: CT HEAD FINDINGS Brain: No evidence of acute infarction, hemorrhage, hydrocephalus, extra-axial collection or mass lesion/mass effect. Vascular: No hyperdense vessel or unexpected calcification. Skull: Normal. Negative for fracture or focal lesion. Sinuses/Orbits: No acute  finding. Other: None. CT CERVICAL SPINE FINDINGS Alignment: Normal. Skull base and vertebrae: No acute fracture. No primary bone lesion or focal pathologic process. Soft tissues and spinal canal: No prevertebral fluid or swelling. No visible canal hematoma. Disc levels: Mild-to-moderate multilevel disc space height loss and osteophytosis throughout the cervical spine, worst at C5-C7. Upper chest: Negative. Other: None. IMPRESSION: 1. No acute intracranial pathology. 2. No fracture or static subluxation of the cervical spine. 3. Mild-to-moderate multilevel cervical disc degenerative disease. Electronically Signed   By: Marolyn JONETTA Jaksch M.D.   On: 08/31/2023 14:05   CT Cervical Spine Wo Contrast Result Date: 08/31/2023 CLINICAL DATA:  Fall 2 days ago headache and neck pain EXAM: CT HEAD WITHOUT CONTRAST CT CERVICAL SPINE WITHOUT CONTRAST TECHNIQUE: Multidetector CT imaging of the head and cervical spine was performed following the standard protocol without intravenous contrast. Multiplanar CT image reconstructions of the cervical spine were also generated. RADIATION DOSE REDUCTION: This exam was performed according to the departmental dose-optimization program which includes automated exposure control, adjustment of the mA and/or kV according to patient size and/or use of iterative reconstruction technique. COMPARISON:  05/23/2023 FINDINGS: CT HEAD  FINDINGS Brain: No evidence of acute infarction, hemorrhage, hydrocephalus, extra-axial collection or mass lesion/mass effect. Vascular: No hyperdense vessel or unexpected calcification. Skull: Normal. Negative for fracture or focal lesion. Sinuses/Orbits: No acute finding. Other: None. CT CERVICAL SPINE FINDINGS Alignment: Normal. Skull base and vertebrae: No acute fracture. No primary bone lesion or focal pathologic process. Soft tissues and spinal canal: No prevertebral fluid or swelling. No visible canal hematoma. Disc levels: Mild-to-moderate multilevel disc space height loss and osteophytosis throughout the cervical spine, worst at C5-C7. Upper chest: Negative. Other: None. IMPRESSION: 1. No acute intracranial pathology. 2. No fracture or static subluxation of the cervical spine. 3. Mild-to-moderate multilevel cervical disc degenerative disease. Electronically Signed   By: Marolyn JONETTA Jaksch M.D.   On: 08/31/2023 14:05    Procedures Procedures    Medications Ordered in ED Medications  lidocaine  (LIDODERM ) 5 % 1-3 patch (has no administration in time range)    ED Course/ Medical Decision Making/ A&P Clinical Course as of 08/31/23 1441  Fri Aug 31, 2023  1414 No acute traumatic injury on CT imaging. [VK]    Clinical Course User Index [VK] Kingsley, Ziza Hastings K, DO                                 Medical Decision Making This patient presents to the ED with chief complaint(s) of fall with pertinent past medical history of thrombocytosis, prior small bowel AVM which further complicates the presenting complaint. The complaint involves an extensive differential diagnosis and also carries with it a high risk of complications and morbidity.    The differential diagnosis includes due to patient's age and head injury, concern for ICH, mass effect, cervical spine fracture, concussion, muscle strain or spasm, no neuro deficits on exam making a spinal cord injury unlikely, no other traumatic injuries seen  on exam  Additional history obtained: Additional history obtained from N/A Records reviewed N/A  ED Course and Reassessment: On patient's arrival she is hemodynamically stable in no acute distress.  Was initially evaluated by triage and had CT head and C-spine ordered, reads are pending at this time.  Does have normal healing appearing abrasions, no other traumatic injuries seen on exam.  Reports her tetanus is up-to-date and does not require update today.  Independent  labs interpretation:  N/A  Independent visualization of imaging: - I independently visualized the following imaging with scope of interpretation limited to determining acute life threatening conditions related to emergency care: CTH/C-spine, which revealed no acute traumatic injury  Consultation: - Consulted or discussed management/test interpretation w/ external professional: N/A  Consideration for admission or further workup: Patient has no emergent conditions requiring admission or further work-up at this time and is stable for discharge home with primary care follow-up  Social Determinants of health: N/A    Amount and/or Complexity of Data Reviewed Radiology: ordered.  Risk Prescription drug management.          Final Clinical Impression(s) / ED Diagnoses Final diagnoses:  Fall, initial encounter  Whiplash injury to neck, initial encounter    Rx / DC Orders ED Discharge Orders          Ordered    lidocaine  (LIDODERM ) 5 %  Every 24 hours        08/31/23 1437    cyclobenzaprine  (FLEXERIL ) 10 MG tablet  2 times daily PRN        08/31/23 1437              Kingsley, Ismael Karge K, DO 08/31/23 1441

## 2023-09-06 ENCOUNTER — Ambulatory Visit: Payer: Self-pay | Admitting: Internal Medicine

## 2023-09-06 ENCOUNTER — Encounter: Payer: Self-pay | Admitting: Internal Medicine

## 2023-09-06 ENCOUNTER — Ambulatory Visit (INDEPENDENT_AMBULATORY_CARE_PROVIDER_SITE_OTHER): Payer: Medicare Other | Admitting: Internal Medicine

## 2023-09-06 VITALS — BP 114/68 | HR 79 | Temp 98.0°F | Ht 66.0 in

## 2023-09-06 DIAGNOSIS — M533 Sacrococcygeal disorders, not elsewhere classified: Secondary | ICD-10-CM

## 2023-09-06 DIAGNOSIS — M542 Cervicalgia: Secondary | ICD-10-CM

## 2023-09-06 DIAGNOSIS — R1032 Left lower quadrant pain: Secondary | ICD-10-CM

## 2023-09-06 DIAGNOSIS — K219 Gastro-esophageal reflux disease without esophagitis: Secondary | ICD-10-CM | POA: Diagnosis not present

## 2023-09-06 DIAGNOSIS — R002 Palpitations: Secondary | ICD-10-CM

## 2023-09-06 DIAGNOSIS — Z23 Encounter for immunization: Secondary | ICD-10-CM

## 2023-09-06 MED ORDER — METHOCARBAMOL 500 MG PO TABS: 500.0000 mg | ORAL_TABLET | Freq: Four times a day (QID) | ORAL | 1 refills | Status: DC | PRN

## 2023-09-06 NOTE — Telephone Encounter (Signed)
 This RN attempted return call to patient to discuss medication side effects. No answer. LVM. Will route to Call Back folder.

## 2023-09-06 NOTE — Patient Instructions (Signed)
 Please continue all other medications as before, and refills have been done if requested - robaxin   Please have the pharmacy call with any other refills you may need.  Please continue your efforts at being more active, low cholesterol diet, and weight control.  You are otherwise up to date with prevention measures today.  Please keep your appointments with your specialists as you may have planned  No further lab work needed today  Please make an Appointment to return in 6 months, or sooner if needed

## 2023-09-06 NOTE — Telephone Encounter (Signed)
 Copied from CRM [id number]. Topic: Clinical - Medication Question >> Sep 06, 2023  3:23 PM Adaysia C wrote: Reason for CRM: Patient is taking Flexeril  (cyclobenzaprine ) and she has expressed the side effects are not agreeing with her and she has requested the provider to prescribe her Robaxin  instead. Please follow up with patient [phone]

## 2023-09-06 NOTE — Progress Notes (Signed)
 Patient ID: Kristina Osborne, female   DOB: 03/21/1951, 73 y.o.   MRN: [id number]         Chief Complaint:: yearly exam  Chief Complaint  Patient presents with   Follow-up    Welcome to medicare         HPI:  Kristina Osborne is a 73 y.o. female here for above; did have a fall backwards and hit post head on the floor, seen in ED with neg CT head but prob mild concussion and whiplash, tx with flexeril  but too drowsy with 10 mg to went back to the robaxin  from last year.  Alo has postional LLQ pain , sharp pain with twisting at the waist.  Pt denies chest pain, increased sob or doe, wheezing, orthopnea, PND, increased LE swelling, palpitations, dizziness or syncope.   Pt denies polydipsia, polyuria, or new focal neuro s/s.    Pt denies fever, wt loss, night sweats, loss of appetite, or other constitutional symptoms  Due for flu shot  Denies worsening reflux, abd pain, dysphagia, n/v, bowel change or blood. Wt Readings from Last 3 Encounters:  06/26/23 170 lb (77.1 kg)  01/17/23 180 lb (81.6 kg)  12/11/22 170 lb (77.1 kg)   BP Readings from Last 3 Encounters:  09/06/23 114/68  08/31/23 124/77  08/09/23 108/68   Immunization History  Administered Date(s) Administered   Influenza-Unspecified 06/28/2021   PNEUMOCOCCAL CONJUGATE-20 09/07/2021   Tdap 03/03/2020   Health Maintenance Due  Topic Date Due   Medicare Annual Wellness (AWV)  Never done   Hepatitis C Screening  Never done   Zoster Vaccines- Shingrix (1 of 2) Never done   MAMMOGRAM  Never done   DEXA SCAN  Never done   INFLUENZA VACCINE  03/29/2023      Past Medical History:  Diagnosis Date   ABLA (acute blood loss anemia) 04/16/2022   Congenital malformation of intestinal fixation    Gastritis    Gastroesophageal reflux disease without esophagitis 09/10/2021   IDA (iron  deficiency anemia)    Mitral valve prolapse    Thrombocytosis    Past Surgical History:  Procedure Laterality Date   BIOPSY  06/26/2022   Procedure:  BIOPSY;  Surgeon: Stacia Glendia BRAVO, MD;  Location: [facility];  Service: Gastroenterology;;   COLONOSCOPY N/A 04/17/2022   Procedure: COLONOSCOPY;  Surgeon: Albertus Gordy HERO, MD;  Location: [facility];  Service: Gastroenterology;  Laterality: N/A;   COLONOSCOPY WITH PROPOFOL  N/A 06/26/2022   Procedure: COLONOSCOPY WITH PROPOFOL ;  Surgeon: Stacia Glendia BRAVO, MD;  Location: [facility];  Service: Gastroenterology;  Laterality: N/A;   ENTEROSCOPY N/A 04/17/2022   Procedure: ENTEROSCOPY;  Surgeon: Albertus Gordy HERO, MD;  Location: [facility];  Service: Gastroenterology;  Laterality: N/A;   ENTEROSCOPY N/A 06/26/2022   Procedure: ENTEROSCOPY;  Surgeon: Stacia Glendia BRAVO, MD;  Location: [facility];  Service: Gastroenterology;  Laterality: N/A;   GIVENS CAPSULE STUDY N/A 06/26/2022   Procedure: GIVENS CAPSULE STUDY;  Surgeon: Stacia Glendia BRAVO, MD;  Location: [facility];  Service: Gastroenterology;  Laterality: N/A;   HEMOSTASIS CLIP PLACEMENT  04/17/2022   Procedure: HEMOSTASIS CLIP PLACEMENT;  Surgeon: Albertus Gordy HERO, MD;  Location: [facility];  Service: Gastroenterology;;   HEMOSTASIS CLIP PLACEMENT  06/26/2022   Procedure: HEMOSTASIS CLIP PLACEMENT;  Surgeon: Stacia Glendia BRAVO, MD;  Location: [facility];  Service: Gastroenterology;;   HOT HEMOSTASIS N/A 04/17/2022   Procedure: HOT HEMOSTASIS (ARGON PLASMA COAGULATION/BICAP);  Surgeon: Albertus Gordy HERO, MD;  Location: [facility];  Service:  Gastroenterology;  Laterality: N/A;   HOT HEMOSTASIS N/A 06/26/2022   Procedure: HOT HEMOSTASIS (ARGON PLASMA COAGULATION/BICAP);  Surgeon: Stacia Glendia BRAVO, MD;  Location: [facility];  Service: Gastroenterology;  Laterality: N/A;   POLYPECTOMY  04/17/2022   Procedure: POLYPECTOMY;  Surgeon: Albertus Gordy HERO, MD;  Location: [facility];  Service: Gastroenterology;;   POLYPECTOMY  06/26/2022   Procedure: POLYPECTOMY;  Surgeon: Stacia Glendia BRAVO, MD;  Location: [facility];  Service:  Gastroenterology;;   SMALL INTESTINE SURGERY  1994   SUBMUCOSAL TATTOO INJECTION  06/26/2022   Procedure: SUBMUCOSAL TATTOO INJECTION;  Surgeon: Stacia Glendia BRAVO, MD;  Location: [facility];  Service: Gastroenterology;;    reports that she has quit smoking. Her smoking use included cigarettes. She has never used smokeless tobacco. She reports that she does not currently use alcohol. She reports that she does not use drugs. family history includes Alcoholism in her half-sister; Healthy in her sister; Heart disease in her mother; Valvular heart disease in her mother. Allergies  Allergen Reactions   Aspirin Other (See Comments)    Due to intestinal issue   Sulfa Antibiotics Other (See Comments)    bleeding   Current Outpatient Medications on File Prior to Visit  Medication Sig Dispense Refill   acetaminophen  (TYLENOL ) 500 MG tablet Take 1,000 mg by mouth every 6 (six) hours as needed for mild pain or headache.     Ascorbic Acid (VITAMIN C) 1000 MG tablet Take 500 mg by mouth in the morning and at bedtime.     chlorhexidine  (PERIDEX ) 0.12 % solution 5 mLs by Mouth Rinse route as needed.     cholecalciferol (VITAMIN D3) 25 MCG (1000 UNIT) tablet Take 1,000 Units by mouth daily.     cholestyramine  light (PREVALITE ) 4 g packet Take 1 packet (4 g total) by mouth 2 (two) times daily. 180 each 3   famotidine  (PEPCID ) 40 MG tablet TAKE 1 TABLET(40 MG) BY MOUTH DAILY 90 tablet 0   folic acid  (FOLVITE ) 1 MG tablet Take 1 tablet (1 mg total) by mouth daily. 90 tablet 1   glucosamine-chondroitin 500-400 MG tablet Take 1 tablet by mouth 2 (two) times daily.     hydroxyurea  (HYDREA ) 500 MG capsule Take 1 capsule (500 mg total) by mouth 2 (two) times daily. 180 capsule 1   lidocaine  (LIDODERM ) 5 % Place 1 patch onto the skin daily. Remove & Discard patch within 12 hours or as directed by MD 30 patch 0   metoprolol  tartrate (LOPRESSOR ) 25 MG tablet Take 0.5 tablets (12.5 mg total) by mouth 2 (two) times  daily. Please call office to schedule an appt for further refills. Thank you 30 tablet 0   Multiple Vitamins-Minerals (MULTIVITAMIN ADULTS 50+) TABS Take 1 tablet by mouth daily.     Multiple Vitamins-Minerals (PRESERVISION AREDS 2+MULTI VIT PO) Take 1 capsule by mouth 2 (two) times daily.     sucralfate  (CARAFATE ) 1 g tablet TAKE 1 TABLET(1 GRAM) BY MOUTH FOUR TIMES DAILY AT BEDTIME WITH MEALS 360 tablet 1   tretinoin  (RETIN-A ) 0.05 % cream Apply topically at bedtime. Apply a pea sized amount mixed with a quarter sized amount to the affected area three nights a week 45 g 2   No current facility-administered medications on file prior to visit.        ROS:  All others reviewed and negative.  Objective        PE:  BP 114/68 (BP Location: Right Arm, Patient Position: Sitting, Cuff Size: Normal)  Pulse 79   Temp 98 F (36.7 C) (Oral)   Ht 5' 6 (1.676 m)   LMP  (LMP Unknown)   SpO2 98%   BMI 27.44 kg/m                 Constitutional: Pt appears in NAD               HENT: Head: NCAT.                Right Ear: External ear normal.                 Left Ear: External ear normal.                Eyes: . Pupils are equal, round, and reactive to light. Conjunctivae and EOM are normal               Nose: without d/c or deformity               Neck: Neck supple. Gross normal ROM               Cardiovascular: Normal rate and regular rhythm.                 Pulmonary/Chest: Effort normal and breath sounds without rales or wheezing.                Abd:  Soft, NT, ND, + BS, no organomegaly               Neurological: Pt is alert. At baseline orientation, motor grossly intact               Skin: Skin is warm. No rashes, no other new lesions, LE edema - none               Psychiatric: Pt behavior is normal without agitation   Micro: none  Cardiac tracings I have personally interpreted today:  NSR 77  Pertinent Radiological findings (summarize): none   Lab Results  Component Value Date   WBC  4.4 08/31/2023   HGB 12.3 08/31/2023   HCT 36.4 08/31/2023   PLT 844 (H) 08/31/2023   GLUCOSE 97 08/31/2023   ALT 20 08/31/2023   AST 23 08/31/2023   NA 139 08/31/2023   K 4.0 08/31/2023   CL 106 08/31/2023   CREATININE 0.77 08/31/2023   BUN 12 08/31/2023   CO2 29 08/31/2023   TSH 3.341 06/26/2022   INR 1.0 06/24/2022   HGBA1C 4.4 (L) 04/16/2022   Assessment/Plan:  Kristina Osborne is a 73 y.o. White or Caucasian [1] female with  has a past medical history of ABLA (acute blood loss anemia) (04/16/2022), Congenital malformation of intestinal fixation, Gastritis, Gastroesophageal reflux disease without esophagitis (09/10/2021), IDA (iron  deficiency anemia), Mitral valve prolapse, and Thrombocytosis.  Gastroesophageal reflux disease without esophagitis mild overall, for TUMS prn, declines need for other  Coccyx pain Mild, doubt fracture, pt declines xray for now, for tylenol  prn  LLQ pain Exam benign, c/w likely msk, declines further lab today  Posterior neck pain Mild, c/w msk strain, for robaxin  500 prn   Followup: Return in about 6 months (around 03/05/2024).  Lynwood Rush, MD 09/08/2023 8:25 PM [facility]

## 2023-09-07 ENCOUNTER — Ambulatory Visit: Payer: Self-pay | Admitting: Internal Medicine

## 2023-09-07 NOTE — Telephone Encounter (Signed)
 Yes, I agree that is the reason for the need to change from flexeril to the robaxin.  Ok to authorize robaxin due to intolerance of flexeril    thanks

## 2023-09-07 NOTE — Telephone Encounter (Signed)
 The patient reported that the sedative affects of Flexeril  last longer than I'd like. It makes me feel uncomfortable and I have a wave of nausea.  The patient reported that Robaxin  worked better for her in the past and she is requesting the switch.  Insurance denied coverage and the pharmacy requested clarification from the pcp of why the switch from Flexeril  to Robaxin  was made.  Reason for Disposition  [1] Caller has NON-URGENT medicine question about med that PCP prescribed AND [2] triager unable to answer question  Answer Assessment - Initial Assessment Questions 1. NAME of MEDICINE: What medicine(s) are you calling about?     Robaxin  2. QUESTION: What is your question? (e.g., double dose of medicine, side effect)     Pharmacy requesting clarification  3. PRESCRIBER: Who prescribed the medicine? Reason: if prescribed by specialist, call should be referred to that group.     Dr. Norleen 4. SYMPTOMS: Do you have any symptoms? If Yes, ask: What symptoms are you having?  How bad are the symptoms (e.g., mild, moderate, severe)     Prefers Robaxin  over Flexeril  Flexeril  causes nausea and makes patient uncomfortable due to sedative affects  Protocols used: Medication Question Call-A-AH

## 2023-09-07 NOTE — Telephone Encounter (Signed)
 This RN was contacted by [facility] agent, prior to transfer, call was disconnected. This RN attempted to call back patient at listed contact number for triage, no answer, LVM requesting callback.

## 2023-09-08 ENCOUNTER — Encounter: Payer: Self-pay | Admitting: Internal Medicine

## 2023-09-08 DIAGNOSIS — R1032 Left lower quadrant pain: Secondary | ICD-10-CM | POA: Insufficient documentation

## 2023-09-08 DIAGNOSIS — M542 Cervicalgia: Secondary | ICD-10-CM | POA: Insufficient documentation

## 2023-09-08 DIAGNOSIS — M533 Sacrococcygeal disorders, not elsewhere classified: Secondary | ICD-10-CM | POA: Insufficient documentation

## 2023-09-08 NOTE — Assessment & Plan Note (Signed)
 Mild, c/w msk strain, for robaxin 500 prn

## 2023-09-08 NOTE — Assessment & Plan Note (Addendum)
 mild overall, for TUMS prn, declines need for other

## 2023-09-08 NOTE — Assessment & Plan Note (Signed)
 Exam benign, c/w likely msk, declines further lab today

## 2023-09-08 NOTE — Addendum Note (Signed)
 Addended by: Corwin Levins on: 09/08/2023 08:28 PM   Modules accepted: Level of Service

## 2023-09-08 NOTE — Assessment & Plan Note (Signed)
 Mild, doubt fracture, pt declines xray for now, for tylenol prn

## 2023-09-11 ENCOUNTER — Other Ambulatory Visit (HOSPITAL_COMMUNITY): Payer: Self-pay

## 2023-09-11 ENCOUNTER — Telehealth: Payer: Self-pay

## 2023-09-11 NOTE — Telephone Encounter (Signed)
 Pharmacy Patient Advocate Encounter   Received notification from  [facility] Portal that prior authorization for Methocarbamol  500MG  tablets is required/requested.   Insurance verification completed.   The patient is insured through [facility] .   Per test claim: PA required; PA started via CoverMyMeds. KEY [id number] . Waiting for clinical questions to populate.

## 2023-09-11 NOTE — Telephone Encounter (Signed)
 Pharmacy Patient Advocate Encounter   Received notification from CoverMyMeds that prior authorization for Methocarbamol  500MG  tablets is required/requested.   Insurance verification completed.   The patient is insured through [facility] .   Per test claim: PA required; PA submitted to above mentioned insurance via CoverMyMeds Key/confirmation #/EOC [facility] Status is pending

## 2023-09-11 NOTE — Telephone Encounter (Signed)
 Pharmacy Patient Advocate Encounter  Received notification from [employer] that Prior Authorization for Methocarbamol  500mg  has been DENIED.  Full denial letter will be uploaded to the media tab. See denial reason below.   PA #/Case ID/Reference #: Methocarbamol  tablet is denied because it is not on your plan's Drug List (formulary). Medication authorization requires the following: (1) You need to try four (4) of these covered drugs: (a) Chlorzoxazone 500mg . (b) EC-Naproxen, naproxen EC tablet, naproxen tablet, naproxen DR, or naproxen sodium tablet. (c) Etodolac capsule. (d) Flurbiprofen. (e) IBU or ibuprofen. (f) Ketoprofen 50mg . (g) Tizanidine tablet. (2) OR your doctor needs to give us  specific medical reasons why four (4) of the covered drug(s) are not appropriate for you

## 2023-09-12 ENCOUNTER — Inpatient Hospital Stay: Payer: Medicare Other

## 2023-09-12 DIAGNOSIS — D500 Iron deficiency anemia secondary to blood loss (chronic): Secondary | ICD-10-CM | POA: Insufficient documentation

## 2023-09-12 DIAGNOSIS — D473 Essential (hemorrhagic) thrombocythemia: Secondary | ICD-10-CM | POA: Insufficient documentation

## 2023-09-12 DIAGNOSIS — K5520 Angiodysplasia of colon without hemorrhage: Secondary | ICD-10-CM | POA: Insufficient documentation

## 2023-09-12 LAB — CMP (CANCER CENTER ONLY)
ALT: 18 U/L (ref 0–44)
AST: 22 U/L (ref 15–41)
Albumin: 4.3 g/dL (ref 3.5–5.0)
Alkaline Phosphatase: 60 U/L (ref 38–126)
Anion gap: 4 — ABNORMAL LOW (ref 5–15)
BUN: 11 mg/dL (ref 8–23)
CO2: 28 mmol/L (ref 22–32)
Calcium: 10 mg/dL (ref 8.9–10.3)
Chloride: 106 mmol/L (ref 98–111)
Creatinine: 0.73 mg/dL (ref 0.44–1.00)
GFR, Estimated: >60 mL/min (ref >60)
Glucose, Bld: 114 mg/dL — ABNORMAL HIGH (ref 70–99)
Potassium: 4.1 mmol/L (ref 3.5–5.1)
Sodium: 138 mmol/L (ref 135–145)
Total Bilirubin: 0.4 mg/dL (ref 0.0–1.2)
Total Protein: 6.8 g/dL (ref 6.5–8.1)

## 2023-09-12 LAB — CBC WITH DIFFERENTIAL (CANCER CENTER ONLY)
Abs Immature Granulocytes: 0.01 10*3/uL (ref 0.00–0.07)
Basophils Absolute: 0 10*3/uL (ref 0.0–0.1)
Basophils Relative: 1 %
Eosinophils Absolute: 0 10*3/uL (ref 0.0–0.5)
Eosinophils Relative: 0 %
HCT: 36.1 % (ref 36.0–46.0)
Hemoglobin: 12.3 g/dL (ref 12.0–15.0)
Immature Granulocytes: 0 %
Lymphocytes Relative: 11 %
Lymphs Abs: 0.5 10*3/uL — ABNORMAL LOW (ref 0.7–4.0)
MCH: 39.8 pg — ABNORMAL HIGH (ref 26.0–34.0)
MCHC: 34.1 g/dL (ref 30.0–36.0)
MCV: 116.8 fL — ABNORMAL HIGH (ref 80.0–100.0)
Monocytes Absolute: 0.4 10*3/uL (ref 0.1–1.0)
Monocytes Relative: 7 %
Neutro Abs: 3.9 10*3/uL (ref 1.7–7.7)
Neutrophils Relative %: 81 %
Platelet Count: 303 10*3/uL (ref 150–400)
RBC: 3.09 MIL/uL — ABNORMAL LOW (ref 3.87–5.11)
RDW: 12.4 % (ref 11.5–15.5)
WBC Count: 4.8 10*3/uL (ref 4.0–10.5)
nRBC: 0 % (ref 0.0–0.2)

## 2023-09-12 LAB — FERRITIN: Ferritin: 14 ng/mL (ref 11–307)

## 2023-09-12 MED ORDER — CYCLOBENZAPRINE HCL 5 MG PO TABS: 5.0000 mg | ORAL_TABLET | Freq: Three times a day (TID) | ORAL | 1 refills | Status: DC | PRN

## 2023-09-12 NOTE — Telephone Encounter (Signed)
Ok this is done thanks

## 2023-09-12 NOTE — Telephone Encounter (Signed)
 Called and followed up with Pt.

## 2023-09-12 NOTE — Telephone Encounter (Signed)
 Ok to let pt know robaxin  denied  She had drowsiness with the flexeril  10 mg which is quite common  It is much less common to have this with flexeril  5 mg, so please let me know if she would want this instead

## 2023-09-12 NOTE — Addendum Note (Signed)
 Addended by: Roslyn Coombe on: 09/12/2023 04:29 PM   Modules accepted: Orders

## 2023-09-24 ENCOUNTER — Telehealth: Payer: Self-pay | Admitting: Hematology and Oncology

## 2023-09-25 ENCOUNTER — Telehealth: Payer: Self-pay | Admitting: *Deleted

## 2023-09-25 NOTE — Telephone Encounter (Signed)
Returned PC to patient - she states her husband is in the hospital & has been diagnosed with pneumonia, she states MD is unsure if it is viral or bacterial.  She is concerned because she has been exposed & is asking what she can do to prevent contracting pneumonia.  Advised patient to wear a mask & wash her hands frequently when she is with her husband.  She states she started having a non-productive cough this morning - advised her to see her PCP if her cough becomes any worse, if she has a fever, SOB, chest pain or chills.  She verbalizes understanding.

## 2023-09-26 ENCOUNTER — Inpatient Hospital Stay: Payer: Medicare Other

## 2023-09-27 ENCOUNTER — Inpatient Hospital Stay: Payer: Medicare Other

## 2023-09-27 ENCOUNTER — Inpatient Hospital Stay: Payer: Medicare Other | Admitting: Hematology and Oncology

## 2023-10-01 ENCOUNTER — Telehealth: Payer: Self-pay | Admitting: *Deleted

## 2023-10-01 ENCOUNTER — Telehealth: Payer: Self-pay | Admitting: Hematology and Oncology

## 2023-10-01 NOTE — Telephone Encounter (Signed)
Received call from pt. She states she is feeling weak and light headed as if her iron is low.  Advised that her iron from 09/12/23 was low @ 14 but she is not anemic. She does not take oral iron as it turns her stool black and then she can't tell if she is having a GI bleed or not. Advised that Dr. Leonides Schanz will review her labs and then likely get her set up for IV iron. We will also scheduled to see Dr. Leonides Schanz or Karena Addison this week or next as well. Pt voiced understanding

## 2023-10-03 ENCOUNTER — Inpatient Hospital Stay: Payer: Medicare Other | Attending: Hematology and Oncology

## 2023-10-03 ENCOUNTER — Inpatient Hospital Stay (HOSPITAL_BASED_OUTPATIENT_CLINIC_OR_DEPARTMENT_OTHER): Payer: Medicare Other | Admitting: Hematology and Oncology

## 2023-10-03 VITALS — BP 128/77 | HR 86 | Temp 97.7°F | Resp 16

## 2023-10-03 DIAGNOSIS — D500 Iron deficiency anemia secondary to blood loss (chronic): Secondary | ICD-10-CM

## 2023-10-03 DIAGNOSIS — K5520 Angiodysplasia of colon without hemorrhage: Secondary | ICD-10-CM

## 2023-10-03 DIAGNOSIS — Z79899 Other long term (current) drug therapy: Secondary | ICD-10-CM | POA: Insufficient documentation

## 2023-10-03 DIAGNOSIS — D473 Essential (hemorrhagic) thrombocythemia: Secondary | ICD-10-CM | POA: Diagnosis present

## 2023-10-03 DIAGNOSIS — D75839 Thrombocytosis, unspecified: Secondary | ICD-10-CM | POA: Insufficient documentation

## 2023-10-03 LAB — CBC WITH DIFFERENTIAL (CANCER CENTER ONLY)
Abs Immature Granulocytes: 0.02 10*3/uL (ref 0.00–0.07)
Basophils Absolute: 0 10*3/uL (ref 0.0–0.1)
Basophils Relative: 0 %
Eosinophils Absolute: 0.1 10*3/uL (ref 0.0–0.5)
Eosinophils Relative: 1 %
HCT: 38.2 % (ref 36.0–46.0)
Hemoglobin: 12.8 g/dL (ref 12.0–15.0)
Immature Granulocytes: 0 %
Lymphocytes Relative: 10 %
Lymphs Abs: 0.6 10*3/uL — ABNORMAL LOW (ref 0.7–4.0)
MCH: 40.5 pg — ABNORMAL HIGH (ref 26.0–34.0)
MCHC: 33.5 g/dL (ref 30.0–36.0)
MCV: 120.9 fL — ABNORMAL HIGH (ref 80.0–100.0)
Monocytes Absolute: 0.4 10*3/uL (ref 0.1–1.0)
Monocytes Relative: 6 %
Neutro Abs: 4.9 10*3/uL (ref 1.7–7.7)
Neutrophils Relative %: 83 %
Platelet Count: 600 10*3/uL — ABNORMAL HIGH (ref 150–400)
RBC: 3.16 MIL/uL — ABNORMAL LOW (ref 3.87–5.11)
RDW: 13 % (ref 11.5–15.5)
WBC Count: 6 10*3/uL (ref 4.0–10.5)
nRBC: 0 % (ref 0.0–0.2)

## 2023-10-03 LAB — CMP (CANCER CENTER ONLY)
ALT: 16 U/L (ref 0–44)
AST: 22 U/L (ref 15–41)
Albumin: 4.6 g/dL (ref 3.5–5.0)
Alkaline Phosphatase: 68 U/L (ref 38–126)
Anion gap: 6 (ref 5–15)
BUN: 14 mg/dL (ref 8–23)
CO2: 28 mmol/L (ref 22–32)
Calcium: 10.4 mg/dL — ABNORMAL HIGH (ref 8.9–10.3)
Chloride: 104 mmol/L (ref 98–111)
Creatinine: 0.83 mg/dL (ref 0.44–1.00)
GFR, Estimated: >60 mL/min (ref >60)
Glucose, Bld: 117 mg/dL — ABNORMAL HIGH (ref 70–99)
Potassium: 4.9 mmol/L (ref 3.5–5.1)
Sodium: 138 mmol/L (ref 135–145)
Total Bilirubin: 0.5 mg/dL (ref 0.0–1.2)
Total Protein: 6.9 g/dL (ref 6.5–8.1)

## 2023-10-03 LAB — FERRITIN: Ferritin: 28 ng/mL (ref 11–307)

## 2023-10-03 NOTE — Progress Notes (Signed)
 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 Federico  10/26/2021: WBC 6.7, Hgb 13.3, MCV 121.7, Plt 204 03/10/2022: WBC 3.7, Hgb 11.2, MCV 120, Plt 261. Decrease hydroxyurea  to 1000 mg BID due to cytopenias.  06/24/2022-06/27/2022: Admitted with worsening GI bleed. Hydroxyurea  held.  08/01/2022: WBC 4.4, hemoglobin 9.8, MCV 105.1, and platelets of 190 02/26/2023: WBC 7.3, Hgb 12.5, MCV 109.1, Plt 807. Increase hydroxyurea  to 1000 mg daily with 1500 mg every other day 04/27/2023: WBC 4.8, Hgb 12.5, MCV 111.3, Plt 528. Increase hydroxyurea  to 1500 mg daily 06/20/2023: Return dose to hydroxyurea  500 mg twice daily 07/17/2023: increased to1500 mg hydroxyurea  daily (1000 in PM and 500 in AM)   Interval History:  Kristina Osborne 73 y.o. female with medical history significant for essential thrombocytosis who presents for a follow up visit. The patient's last visit was on 06/20/2023. In the interim since the last visit Kristina Osborne has continued 1500 mg hydroxyurea  daily (1000 in PM and 500 in AM).    On exam today Kristina Osborne reports she has been under a lot of stress lately.  Her husband unfortunately recently had a pneumonia as well as concern for possible stroke.  She reports that she also had some viral symptoms and was treated with Tamiflu as well as amoxicillin .  She reports that it caused her stomach to act up.  She has that she is interested in probiotic therapy and is considering starting yogurt.  She reports that she is having a lot of anxiety and overall feeling like her levels  are low.  She has been compliant with her hydroxyurea  therapy and notes that she is not having any side effects as a result of that.  Otherwise she is at her baseline level of health with no questions concerns or complaints today.  She denies any fevers, chills, sweats, nausea, vomiting or diarrhea.  A full 10 point ROS is listed below.   MEDICAL HISTORY:  Past Medical History:  Diagnosis Date   ABLA (acute blood loss anemia) 04/16/2022   Congenital malformation of intestinal fixation    Gastritis    Gastroesophageal reflux disease without esophagitis 09/10/2021   IDA (iron  deficiency anemia)    Mitral valve prolapse    Thrombocytosis     SURGICAL HISTORY: Past Surgical History:  Procedure Laterality Date   BIOPSY  06/26/2022   Procedure: BIOPSY;  Surgeon: Stacia Glendia BRAVO, MD;  Location: [facility];  Service: Gastroenterology;;   COLONOSCOPY N/A 04/17/2022   Procedure: COLONOSCOPY;  Surgeon: Albertus Gordy HERO, MD;  Location: [facility];  Service: Gastroenterology;  Laterality: N/A;   COLONOSCOPY WITH PROPOFOL  N/A 06/26/2022   Procedure: COLONOSCOPY WITH PROPOFOL ;  Surgeon: Stacia Glendia BRAVO, MD;  Location: [facility];  Service: Gastroenterology;  Laterality: N/A;   ENTEROSCOPY N/A 04/17/2022   Procedure: ENTEROSCOPY;  Surgeon: Albertus Gordy HERO, MD;  Location: [facility];  Service: Gastroenterology;  Laterality: N/A;   ENTEROSCOPY N/A 06/26/2022   Procedure: ENTEROSCOPY;  Surgeon: Stacia Glendia BRAVO, MD;  Location: [facility];  Service: Gastroenterology;  Laterality: N/A;   GIVENS CAPSULE STUDY N/A 06/26/2022   Procedure: GIVENS  CAPSULE STUDY;  Surgeon: Stacia Glendia BRAVO, MD;  Location: [facility];  Service: Gastroenterology;  Laterality: N/A;   HEMOSTASIS CLIP PLACEMENT  04/17/2022   Procedure: HEMOSTASIS CLIP PLACEMENT;  Surgeon: Albertus Gordy HERO, MD;  Location: [facility];  Service: Gastroenterology;;   HEMOSTASIS CLIP PLACEMENT  06/26/2022   Procedure: HEMOSTASIS CLIP  PLACEMENT;  Surgeon: Stacia Glendia BRAVO, MD;  Location: [facility];  Service: Gastroenterology;;   HOT HEMOSTASIS N/A 04/17/2022   Procedure: HOT HEMOSTASIS (ARGON PLASMA COAGULATION/BICAP);  Surgeon: Albertus Gordy HERO, MD;  Location: [facility];  Service: Gastroenterology;  Laterality: N/A;   HOT HEMOSTASIS N/A 06/26/2022   Procedure: HOT HEMOSTASIS (ARGON PLASMA COAGULATION/BICAP);  Surgeon: Stacia Glendia BRAVO, MD;  Location: [facility];  Service: Gastroenterology;  Laterality: N/A;   POLYPECTOMY  04/17/2022   Procedure: POLYPECTOMY;  Surgeon: Albertus Gordy HERO, MD;  Location: [facility];  Service: Gastroenterology;;   POLYPECTOMY  06/26/2022   Procedure: POLYPECTOMY;  Surgeon: Stacia Glendia BRAVO, MD;  Location: [facility];  Service: Gastroenterology;;   SMALL INTESTINE SURGERY  1994   SUBMUCOSAL TATTOO INJECTION  06/26/2022   Procedure: SUBMUCOSAL TATTOO INJECTION;  Surgeon: Stacia Glendia BRAVO, MD;  Location: [facility];  Service: Gastroenterology;;    SOCIAL HISTORY: Social History   Socioeconomic History   Marital status: Married    Spouse name: Not on file   Number of children: 0   Years of education: Not on file   Highest education level: Not on file  Occupational History   Not on file  Tobacco Use   Smoking status: Former    Types: Cigarettes   Smokeless tobacco: Never  Vaping Use   Vaping status: Never Used  Substance and Sexual Activity   Alcohol use: Not Currently   Drug use: Never   Sexual activity: Yes    Partners: Male  Other Topics Concern   Not on file  Social History Narrative   Not on file   Social Drivers of Health   Financial Resource Strain: Not on file  Food Insecurity: No Food Insecurity (06/24/2022)   Hunger Vital Sign    Worried About Running Out of Food in the Last Year: Never true    Ran Out of Food in the Last Year: Never true  Transportation Needs: No Transportation Needs (06/24/2022)   PRAPARE - Scientist, Research (physical Sciences) (Medical): No    Lack of Transportation (Non-Medical): No  Physical Activity: Not on file  Stress: Not on file  Social Connections: Not on file  Intimate Partner Violence: Not At Risk (06/24/2022)   Humiliation, Afraid, Rape, and Kick questionnaire    Fear of Current or Ex-Partner: No    Emotionally Abused: No    Physically Abused: No    Sexually Abused: No    FAMILY HISTORY: Family History  Problem Relation Age of Onset   Heart disease Mother    Valvular heart disease Mother    Healthy Sister    Alcoholism Half-Sister     ALLERGIES:  is allergic to aspirin and sulfa antibiotics.  MEDICATIONS:  Current Outpatient Medications  Medication Sig Dispense Refill   acetaminophen  (TYLENOL ) 500 MG tablet Take 1,000 mg by mouth every 6 (six) hours as needed for mild pain or headache.     Ascorbic Acid (VITAMIN C) 1000 MG tablet Take 500 mg by mouth in the morning and at bedtime.     chlorhexidine  (PERIDEX ) 0.12 % solution 5 mLs by Mouth Rinse route as needed.  cholecalciferol (VITAMIN D3) 25 MCG (1000 UNIT) tablet Take 1,000 Units by mouth daily.     cholestyramine  light (PREVALITE ) 4 g packet Take 1 packet (4 g total) by mouth 2 (two) times daily. 180 each 3   cyclobenzaprine  (FLEXERIL ) 5 MG tablet Take 1 tablet (5 mg total) by mouth 3 (three) times daily as needed for muscle spasms. 40 tablet 1   famotidine  (PEPCID ) 40 MG tablet TAKE 1 TABLET(40 MG) BY MOUTH DAILY 90 tablet 0   folic acid  (FOLVITE ) 1 MG tablet Take 1 tablet (1 mg total) by mouth daily. 90 tablet 1   glucosamine-chondroitin 500-400 MG tablet Take 1 tablet by mouth 2 (two) times daily.     hydroxyurea  (HYDREA ) 500 MG capsule Take 1 capsule (500 mg total) by mouth 2 (two) times daily. 180 capsule 1   lidocaine  (LIDODERM ) 5 % Place 1 patch onto the skin daily. Remove & Discard patch within 12 hours or as directed by MD 30 patch 0   metoprolol  tartrate (LOPRESSOR ) 25 MG tablet Take 0.5 tablets (12.5 mg  total) by mouth 2 (two) times daily. Please call office to schedule an appt for further refills. Thank you 30 tablet 0   Multiple Vitamins-Minerals (MULTIVITAMIN ADULTS 50+) TABS Take 1 tablet by mouth daily.     Multiple Vitamins-Minerals (PRESERVISION AREDS 2+MULTI VIT PO) Take 1 capsule by mouth 2 (two) times daily.     sucralfate  (CARAFATE ) 1 g tablet TAKE 1 TABLET(1 GRAM) BY MOUTH FOUR TIMES DAILY AT BEDTIME WITH MEALS 360 tablet 1   tretinoin  (RETIN-A ) 0.05 % cream Apply topically at bedtime. Apply a pea sized amount mixed with a quarter sized amount to the affected area three nights a week 45 g 2   No current facility-administered medications for this visit.    REVIEW OF SYSTEMS:   Constitutional: ( - ) fevers, ( - )  chills , ( - ) night sweats Eyes: ( - ) blurriness of vision, ( - ) double vision, ( - ) watery eyes Ears, nose, mouth, throat, and face: ( - ) mucositis, ( - ) sore throat Respiratory: ( - ) cough, ( - ) dyspnea, ( - ) wheezes Cardiovascular: ( - ) palpitation, ( - ) chest discomfort, ( - ) lower extremity swelling Gastrointestinal:  ( - ) nausea, ( - ) heartburn, ( - ) change in bowel habits Skin: ( - ) abnormal skin rashes Lymphatics: ( - ) new lymphadenopathy, ( - ) easy bruising Neurological: ( - ) numbness, ( - ) tingling, ( - ) new weaknesses Behavioral/Psych: ( - ) mood change, ( - ) new changes  All other systems were reviewed with the patient and are negative.  PHYSICAL EXAMINATION:  Vitals:   10/03/23 1217  BP: 128/77  Pulse: 86  Resp: 16  Temp: 97.7 F (36.5 C)  SpO2: 100%     GENERAL: well appearing elderly Caucasian female, in NAD  SKIN: skin color, texture, turgor are normal, no rashes or significant lesions EYES: conjunctiva are pink and non-injected, sclera clear LUNGS: clear to auscultation and percussion with normal breathing effort HEART: regular rate & rhythm and no murmurs and no lower extremity edema Musculoskeletal: no cyanosis of  digits and no clubbing  PSYCH: alert & oriented x 3, fluent speech NEURO: no focal motor/sensory deficits   LABORATORY DATA:  I have reviewed the data as listed    Latest Ref Rng & Units 10/03/2023   12:02 PM 09/12/2023   10:08 AM  08/31/2023    9:55 AM  CBC  WBC 4.0 - 10.5 K/uL 6.0  4.8  4.4   Hemoglobin 12.0 - 15.0 g/dL 87.1  87.6  87.6   Hematocrit 36.0 - 46.0 % 38.2  36.1  36.4   Platelets 150 - 400 K/uL 600  303  844        Latest Ref Rng & Units 10/03/2023   12:02 PM 09/12/2023   10:08 AM 08/31/2023    9:55 AM  CMP  Glucose 70 - 99 mg/dL 882  885  97   BUN 8 - 23 mg/dL 14  11  12    Creatinine 0.44 - 1.00 mg/dL 9.16  9.26  9.22   Sodium 135 - 145 mmol/L 138  138  139   Potassium 3.5 - 5.1 mmol/L 4.9  4.1  4.0   Chloride 98 - 111 mmol/L 104  106  106   CO2 22 - 32 mmol/L 28  28  29    Calcium  8.9 - 10.3 mg/dL 89.5  89.9  9.8   Total Protein 6.5 - 8.1 g/dL 6.9  6.8  6.8   Total Bilirubin 0.0 - 1.2 mg/dL 0.5  0.4  0.3   Alkaline Phos 38 - 126 U/L 68  60  65   AST 15 - 41 U/L 22  22  23    ALT 0 - 44 U/L 16  18  20     RADIOGRAPHIC STUDIES: No results found.  ASSESSMENT & PLAN Kristina Osborne 73 y.o. female with medical history significant for essential thrombocytosis who presents for a follow up visit.   After review of the labs, review of the records, and discussion with the patient the patients findings are most consistent with thrombocytosis, reported due to essential thrombocythemia. We have received the records showing that she has a JAK2 mutation.  Additionally she will require a bone marrow biopsy in order to assure that essential thrombocythemia is the diagnosis.  In the interim we will have the patient continue her hydroxyurea  as previously prescribed by her provider in [place] .  # Essential thrombocythemia, JAK2 Positive  -- At this time we will need to confirm that the patient has essential thrombocythemia.  She will require a bone marrow biopsy to confirm this  diagnosis --JAK2 testing received from patient's prior hematologist. Confirmed JAK2 mutation. Plan:   -- If and when the patient is agreeable we will schedule her for a bone marrow biopsy. --Hydroxyurea  was previously held due to GI bleed and anemia.  Anemia is improving with IV iron  therapy and therefore we can cotinue hydroxyurea  at this time. --continue 1500 mg hydroxyurea  daily (1000 in PM and 500 in AM)  -- Labs today show WBC 6.0, hemoglobin 12.8, MCV 120.9, platelets 600 --continue with labs q 2 weeks followed by a clinic visit in 6 weeks.  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  Or recent doses were performed on 11/07/2022, 01/18/2023, and most recently 06/26/2023. --Patient has history of GI bleeding. Patient notes no active maroon/red/black stools.  Appreciate assistance of GI in the management of this case. --Labs today as noted above.  -- If the patient were to have recurrent anemia or persistent iron  deficiency will order another dose of IV Monoferric .  Will order another dose today --continue to follow with above labs.   No orders of  the defined types were placed in this encounter.   All questions were answered. The patient knows to call the clinic with any problems, questions or concerns.  A total of more than 30 minutes were spent on this encounter with face-to-face time and non-face-to-face time, including preparing to see the patient, ordering tests and/or medications, counseling the patient and coordination of care as outlined above.   Norleen IVAR Kidney, MD Department of Hematology/Oncology [facility] Phone: [phone] Pager: [phone] Email: [email or web address]  10/03/2023 7:19 PM

## 2023-10-04 ENCOUNTER — Telehealth: Payer: Self-pay | Admitting: *Deleted

## 2023-10-04 ENCOUNTER — Telehealth: Payer: Self-pay

## 2023-10-04 NOTE — Addendum Note (Signed)
 Addended by: WONDA BETTER on: 10/04/2023 09:41 AM   Modules accepted: Orders

## 2023-10-04 NOTE — Telephone Encounter (Signed)
 TCT patient. Spoke with her. Reviewed lab results with her. Advised that Dr. Rosaline Coma has ordered her IV iron  and to expect a call from the [facility] in the next couple of days. Pt voiced understanding.

## 2023-10-04 NOTE — Telephone Encounter (Signed)
 Dr. Federico, patient will be scheduled as soon as possible.  Auth Submission: NO AUTH NEEDED Site of care: Site of care: [facility] Payer: Medicare A/B with AARP supplement Medication & CPT/J Code(s) submitted: Feraheme (ferumoxytol ) [phone] Route of submission (phone, fax, portal):  Phone # Fax # Auth type: Buy/Bill PB Units/visits requested: 510mg  x 2 doses Reference number:  Approval from: 10/04/23 to 03/02/24

## 2023-10-05 ENCOUNTER — Inpatient Hospital Stay: Payer: Medicare Other | Admitting: Physician Assistant

## 2023-10-05 ENCOUNTER — Ambulatory Visit (INDEPENDENT_AMBULATORY_CARE_PROVIDER_SITE_OTHER): Payer: Medicare Other

## 2023-10-05 ENCOUNTER — Inpatient Hospital Stay: Payer: Medicare Other

## 2023-10-05 VITALS — BP 102/63 | HR 90 | Temp 97.9°F | Resp 18 | Ht 66.0 in

## 2023-10-05 DIAGNOSIS — D500 Iron deficiency anemia secondary to blood loss (chronic): Secondary | ICD-10-CM | POA: Diagnosis not present

## 2023-10-05 DIAGNOSIS — K5520 Angiodysplasia of colon without hemorrhage: Secondary | ICD-10-CM | POA: Diagnosis not present

## 2023-10-05 MED ORDER — SODIUM CHLORIDE 0.9 % IV SOLN
510.0000 mg | Freq: Once | INTRAVENOUS | Status: AC
  Administered 2023-10-05: 510 mg via INTRAVENOUS
  Filled 2023-10-05: qty 17

## 2023-10-05 NOTE — Progress Notes (Signed)
 Diagnosis: Iron  Deficiency Anemia  Provider:  Praveen Mannam MD  Procedure: IV Infusion  IV Type: Peripheral, IV Location: R Forearm  Feraheme (Ferumoxytol ), Dose: 510 mg  Infusion Start Time: 1358  Infusion Stop Time: 1414  Post Infusion IV Care: Patient declined observation and Peripheral IV Discontinued  Discharge: Condition: Good, Destination: Home . AVS Declined  Performed by:  Maha Fischel, RN

## 2023-10-06 ENCOUNTER — Emergency Department (HOSPITAL_BASED_OUTPATIENT_CLINIC_OR_DEPARTMENT_OTHER): Payer: Medicare Other

## 2023-10-06 ENCOUNTER — Other Ambulatory Visit (HOSPITAL_BASED_OUTPATIENT_CLINIC_OR_DEPARTMENT_OTHER): Payer: Self-pay

## 2023-10-06 ENCOUNTER — Encounter (HOSPITAL_BASED_OUTPATIENT_CLINIC_OR_DEPARTMENT_OTHER): Payer: Self-pay | Admitting: Emergency Medicine

## 2023-10-06 ENCOUNTER — Emergency Department (HOSPITAL_BASED_OUTPATIENT_CLINIC_OR_DEPARTMENT_OTHER)
Admission: EM | Admit: 2023-10-06 | Discharge: 2023-10-06 | Disposition: A | Discharge status: Home / Self Care | Payer: Medicare Other | Attending: Emergency Medicine | Admitting: Emergency Medicine

## 2023-10-06 DIAGNOSIS — J90 Pleural effusion, not elsewhere classified: Secondary | ICD-10-CM | POA: Insufficient documentation

## 2023-10-06 DIAGNOSIS — R0602 Shortness of breath: Secondary | ICD-10-CM | POA: Diagnosis present

## 2023-10-06 DIAGNOSIS — Z79899 Other long term (current) drug therapy: Secondary | ICD-10-CM | POA: Insufficient documentation

## 2023-10-06 LAB — TROPONIN I (HIGH SENSITIVITY): Troponin I (High Sensitivity): 4 ng/L (ref <18)

## 2023-10-06 LAB — CBC WITH DIFFERENTIAL/PLATELET
Abs Immature Granulocytes: 0.01 10*3/uL (ref 0.00–0.07)
Basophils Absolute: 0 10*3/uL (ref 0.0–0.1)
Basophils Relative: 0 %
Eosinophils Absolute: 0.1 10*3/uL (ref 0.0–0.5)
Eosinophils Relative: 1 %
HCT: 35 % — ABNORMAL LOW (ref 36.0–46.0)
Hemoglobin: 11.9 g/dL — ABNORMAL LOW (ref 12.0–15.0)
Immature Granulocytes: 0 %
Lymphocytes Relative: 6 %
Lymphs Abs: 0.4 10*3/uL — ABNORMAL LOW (ref 0.7–4.0)
MCH: 40.6 pg — ABNORMAL HIGH (ref 26.0–34.0)
MCHC: 34 g/dL (ref 30.0–36.0)
MCV: 119.5 fL — ABNORMAL HIGH (ref 80.0–100.0)
Monocytes Absolute: 0.2 10*3/uL (ref 0.1–1.0)
Monocytes Relative: 4 %
Neutro Abs: 5 10*3/uL (ref 1.7–7.7)
Neutrophils Relative %: 89 %
Platelets: 587 10*3/uL — ABNORMAL HIGH (ref 150–400)
RBC: 2.93 MIL/uL — ABNORMAL LOW (ref 3.87–5.11)
RDW: 13.6 % (ref 11.5–15.5)
WBC: 5.5 10*3/uL (ref 4.0–10.5)
nRBC: 0 % (ref 0.0–0.2)

## 2023-10-06 LAB — COMPREHENSIVE METABOLIC PANEL
ALT: 15 U/L (ref 0–44)
AST: 17 U/L (ref 15–41)
Albumin: 3.9 g/dL (ref 3.5–5.0)
Alkaline Phosphatase: 58 U/L (ref 38–126)
Anion gap: 13 (ref 5–15)
BUN: 11 mg/dL (ref 8–23)
CO2: 23 mmol/L (ref 22–32)
Calcium: 9.8 mg/dL (ref 8.9–10.3)
Chloride: 107 mmol/L (ref 98–111)
Creatinine, Ser: 0.66 mg/dL (ref 0.44–1.00)
GFR, Estimated: >60 mL/min (ref >60)
Glucose, Bld: 104 mg/dL — ABNORMAL HIGH (ref 70–99)
Potassium: 3.7 mmol/L (ref 3.5–5.1)
Sodium: 143 mmol/L (ref 135–145)
Total Bilirubin: 0.5 mg/dL (ref 0.0–1.2)
Total Protein: 6.4 g/dL — ABNORMAL LOW (ref 6.5–8.1)

## 2023-10-06 LAB — BRAIN NATRIURETIC PEPTIDE: B Natriuretic Peptide: 195.5 pg/mL — ABNORMAL HIGH (ref 0.0–100.0)

## 2023-10-06 LAB — D-DIMER, QUANTITATIVE: D-Dimer, Quant: 0.85 ug{FEU}/mL — ABNORMAL HIGH (ref 0.00–0.50)

## 2023-10-06 MED ORDER — FUROSEMIDE 20 MG PO TABS
20.0000 mg | ORAL_TABLET | Freq: Every day | ORAL | 0 refills | Status: DC
Start: 2023-10-06 — End: 2023-10-11
  Filled 2023-10-06: qty 5, 5d supply, fill #0

## 2023-10-06 MED ORDER — AZITHROMYCIN 250 MG PO TABS
250.0000 mg | ORAL_TABLET | Freq: Every day | ORAL | 0 refills | Status: DC
  Filled 2023-10-06: qty 6, 5d supply, fill #0

## 2023-10-06 MED ORDER — IOHEXOL 350 MG/ML SOLN
75.0000 mL | Freq: Once | INTRAVENOUS | Status: AC | PRN
  Administered 2023-10-06: 75 mL via INTRAVENOUS

## 2023-10-06 NOTE — ED Notes (Signed)
 RT assessed the Pt and here lungs were clear.

## 2023-10-06 NOTE — Discharge Instructions (Signed)
 Overall I am treating you with antibiotics and fluid pill for some fluid on the lungs.  I suspect that this is from your recent illness.  Follow-up with pulmonary doctor.  Please return if symptoms worsen as discussed.

## 2023-10-06 NOTE — ED Provider Notes (Signed)
 [place] EMERGENCY DEPARTMENT AT [facility] Provider Note   CSN: [id number] Arrival date & time: 10/06/23  0801     History  Chief Complaint  Patient presents with   Shortness of Breath    Kristina Osborne is a 73 y.o. female.  Patient here with shortness of breath the last couple days.  Diagnosed with flu recently has not felt like she is recovered.  No recent surgery or travel.  Has been sick in the hospital here recently as well.  She denies any chest pain weakness numbness tingling.  No nausea vomiting diarrhea.  Seems worse with activities.  No smoking history currently but used to in the past.  Denies any leg swelling.  History of anemia gets iron  transfusions.  No other major medical problems otherwise.  No chest pain.  The history is provided by the patient.       Home Medications Prior to Admission medications   Medication Sig Start Date End Date Taking? Authorizing Provider  azithromycin  (ZITHROMAX ) 250 MG tablet Take 1 tablet (250 mg total) by mouth daily. Take first 2 tablets together, then 1 every day until finished. 10/06/23  Yes Chritopher Coster, DO  furosemide  (LASIX ) 20 MG tablet Take 1 tablet (20 mg total) by mouth daily for 5 days. 10/06/23 10/11/23 Yes Sigurd Pugh, DO  acetaminophen  (TYLENOL ) 500 MG tablet Take 1,000 mg by mouth every 6 (six) hours as needed for mild pain or headache.    [provider]  Ascorbic Acid (VITAMIN C) 1000 MG tablet Take 500 mg by mouth in the morning and at bedtime.    [provider]  chlorhexidine  (PERIDEX ) 0.12 % solution 5 mLs by Mouth Rinse route as needed. 05/29/22   [provider]  cholecalciferol (VITAMIN D3) 25 MCG (1000 UNIT) tablet Take 1,000 Units by mouth daily.    [provider]  cholestyramine  light (PREVALITE ) 4 g packet Take 1 packet (4 g total) by mouth 2 (two) times daily. 07/17/22   Norleen Lynwood ORN, MD  cyclobenzaprine  (FLEXERIL ) 5 MG tablet Take 1 tablet (5 mg total) by  mouth 3 (three) times daily as needed for muscle spasms. 09/12/23   Norleen Lynwood ORN, MD  famotidine  (PEPCID ) 40 MG tablet TAKE 1 TABLET(40 MG) BY MOUTH DAILY 08/23/23   Dorsey, John T IV, MD  folic acid  (FOLVITE ) 1 MG tablet Take 1 tablet (1 mg total) by mouth daily. 05/22/23   Dorsey, John T IV, MD  glucosamine-chondroitin 500-400 MG tablet Take 1 tablet by mouth 2 (two) times daily.    [provider]  hydroxyurea  (HYDREA ) 500 MG capsule Take 1 capsule (500 mg total) by mouth 2 (two) times daily. 05/22/23   Federico Norleen ONEIDA MADISON, MD  lidocaine  (LIDODERM ) 5 % Place 1 patch onto the skin daily. Remove & Discard patch within 12 hours or as directed by MD 08/31/23   Kingsley, Victoria K, DO  metoprolol  tartrate (LOPRESSOR ) 25 MG tablet Take 0.5 tablets (12.5 mg total) by mouth 2 (two) times daily. Please call office to schedule an appt for further refills. Thank you 06/01/23   Mona Vinie BROCKS, MD  Multiple Vitamins-Minerals (MULTIVITAMIN ADULTS 50+) TABS Take 1 tablet by mouth daily.    [provider]  Multiple Vitamins-Minerals (PRESERVISION AREDS 2+MULTI VIT PO) Take 1 capsule by mouth 2 (two) times daily.    [provider]  sucralfate  (CARAFATE ) 1 g tablet TAKE 1 TABLET(1 GRAM) BY MOUTH FOUR TIMES DAILY AT BEDTIME WITH MEALS 08/31/23  Federico Norleen ONEIDA MADISON, MD  tretinoin  (RETIN-A ) 0.05 % cream Apply topically at bedtime. Apply a pea sized amount mixed with a quarter sized amount to the affected area three nights a week 12/25/22 12/25/23  Alm Delon SAILOR, DO      Allergies    Aspirin and Sulfa antibiotics    Review of Systems   Review of Systems  Physical Exam Updated Vital Signs BP 115/76   Pulse 92   Temp 98.2 F (36.8 C)   Resp 20   LMP  (LMP Unknown)   SpO2 100%  Physical Exam Vitals and nursing note reviewed.  Constitutional:      General: She is not in acute distress.    Appearance: She is well-developed. She is not ill-appearing.  HENT:     Head: Normocephalic  and atraumatic.     Mouth/Throat:     Mouth: Mucous membranes are moist.  Eyes:     Extraocular Movements: Extraocular movements intact.     Conjunctiva/sclera: Conjunctivae normal.     Pupils: Pupils are equal, round, and reactive to light.  Cardiovascular:     Rate and Rhythm: Normal rate and regular rhythm.     Pulses: Normal pulses.     Heart sounds: Normal heart sounds. No murmur heard. Pulmonary:     Effort: Pulmonary effort is normal. No respiratory distress.     Breath sounds: Normal breath sounds. No decreased breath sounds or wheezing.  Abdominal:     Palpations: Abdomen is soft.     Tenderness: There is no abdominal tenderness.  Musculoskeletal:        General: No swelling. Normal range of motion.     Cervical back: Normal range of motion and neck supple.     Right lower leg: No edema.     Left lower leg: No edema.  Skin:    General: Skin is warm and dry.     Capillary Refill: Capillary refill takes less than 2 seconds.  Neurological:     General: No focal deficit present.     Mental Status: She is alert.  Psychiatric:        Mood and Affect: Mood normal.     ED Results / Procedures / Treatments   Labs (all labs ordered are listed, but only abnormal results are displayed) Labs Reviewed  CBC WITH DIFFERENTIAL/PLATELET - Abnormal; Notable for the following components:      Result Value   RBC 2.93 (*)    Hemoglobin 11.9 (*)    HCT 35.0 (*)    MCV 119.5 (*)    MCH 40.6 (*)    Platelets 587 (*)    Lymphs Abs 0.4 (*)    All other components within normal limits  COMPREHENSIVE METABOLIC PANEL - Abnormal; Notable for the following components:   Glucose, Bld 104 (*)    Total Protein 6.4 (*)    All other components within normal limits  D-DIMER, QUANTITATIVE - Abnormal; Notable for the following components:   D-Dimer, Quant 0.85 (*)    All other components within normal limits  BRAIN NATRIURETIC PEPTIDE - Abnormal; Notable for the following components:   B  Natriuretic Peptide 195.5 (*)    All other components within normal limits  TROPONIN I (HIGH SENSITIVITY)    EKG EKG Interpretation Date/Time:  Saturday October 06 2023 08:29:11 EST Ventricular Rate:  79 PR Interval:  138 QRS Duration:  93 QT Interval:  421 QTC Calculation: 483 R Axis:   76  Text Interpretation:  Sinus rhythm Probable left atrial enlargement Confirmed by Ruthe Cornet [phone]) on 10/06/2023 9:02:57 AM  Radiology CT Angio Chest PE W and/or Wo Contrast Result Date: 10/06/2023 CLINICAL DATA:  Pulmonary embolism suspected, low to intermediate probability. Positive D-dimer. Patient diagnosed with influenza it for 2 weeks. EXAM: CT ANGIOGRAPHY CHEST WITH CONTRAST TECHNIQUE: Multidetector CT imaging of the chest was performed using the standard protocol during bolus administration of intravenous contrast. Multiplanar CT image reconstructions and MIPs were obtained to evaluate the vascular anatomy. RADIATION DOSE REDUCTION: This exam was performed according to the departmental dose-optimization program which includes automated exposure control, adjustment of the mA and/or kV according to patient size and/or use of iterative reconstruction technique. CONTRAST:  75mL OMNIPAQUE  IOHEXOL  350 MG/ML SOLN COMPARISON:  None Available. FINDINGS: Cardiovascular: The heart size is normal. Atherosclerotic calcifications are present at the aortic arch. No aneurysm or stenosis is present. No dissection is present. Pulmonary artery opacification is excellent. No focal filling defects are present to suggest pulmonary embolus. Pulmonary artery size is normal. Mediastinum/Nodes: No enlarged mediastinal, hilar, or axillary lymph nodes. Thyroid gland, trachea, and esophagus demonstrate no significant findings. Lungs/Pleura: Mild dependent atelectasis is present bilaterally. Bilateral pleural effusions are present, right greater than left. No pneumothorax is present. No focal nodule or mass lesion is present. No  airspace disease is present. The airways are patent. Upper Abdomen: The upper abdomen is within normal limits. Musculoskeletal: No chest wall abnormality. No acute or significant osseous findings. Review of the MIP images confirms the above findings. IMPRESSION: 1. No pulmonary embolus. 2. Bilateral pleural effusions, right greater than left. 3. Mild dependent atelectasis bilaterally. Electronically Signed   By: Lonni Necessary M.D.   On: 10/06/2023 11:14   DG Chest Portable 1 View Result Date: 10/06/2023 CLINICAL DATA:  Shortness of breath and chest pain. EXAM: PORTABLE CHEST 1 VIEW COMPARISON:  04/15/2022 FINDINGS: The lungs are clear without focal pneumonia, edema, pneumothorax or pleural effusion. Interstitial markings are diffusely coarsened with chronic features. Cardiopericardial silhouette is at upper limits of normal for size. No acute bony abnormality. IMPRESSION: Chronic interstitial coarsening without acute cardiopulmonary findings. Electronically Signed   By: Camellia Candle M.D.   On: 10/06/2023 09:20    Procedures Procedures    Medications Ordered in ED Medications  iohexol  (OMNIPAQUE ) 350 MG/ML injection 75 mL (75 mLs Intravenous Contrast Given 10/06/23 1039)    ED Course/ Medical Decision Making/ A&P                                 Medical Decision Making Amount and/or Complexity of Data Reviewed Labs: ordered. Radiology: ordered.  Risk Prescription drug management.   Angeles Zehner is here with shortness of breath.  Recently diagnosed with flu has not quite recovered and still feels short of breath so she with activities at times.  Denies any weakness numbness tingling.  History of mitral valve prolapse iron  deficiency anemia.  Differential diagnosis could bleed postviral pneumonia, ongoing recovery from flu, anemia, ACS, PE, pneumonia, stress.  Will check basic labs EKG CBC D-dimer troponin chest x-ray and reevaluate.  She is clear breath sounds.  Does not seem like  this is a reactive airway process.  No signs of volume overload on exam.  Overall labs with no significant leukocytosis or anemia or electrolyte abnormality.  BNP was 195 troponin normal.  EKG shows sinus rhythm.  No ischemic changes.  D-dimer was elevated so CT scan  was obtained, overall no pulmonary embolus.  She had mild atelectasis and bilateral pleural effusions but no pneumothorax no mass no focal airspace disease.  Overall I do think that this is likely secondary to resolving viral process.  Will put her on a Z-Pak and Lasix  and have her follow-up with primary care and pulmonology.  She understands return if symptoms worsen.  We did talk about if the effusions got bigger she probably become more symptomatic and we would likely need to consider thoracentesis at this time but overall these are fairly mild and she has normal room air oxygenation.  Normal vitals.  Breathing comfortably.  We will trial this treatment plan and have her follow-up with primary care and pulmonology.  She understands return precautions.  She feels comfortable with this plan.  Patient was discharged in good condition.  Have no concern for ACS or other acute process otherwise.  This chart was dictated using voice recognition software.  Despite best efforts to proofread,  errors can occur which can change the documentation meaning.         Final Clinical Impression(s) / ED Diagnoses Final diagnoses:  Pleural effusion    Rx / DC Orders ED Discharge Orders          Ordered    azithromycin  (ZITHROMAX ) 250 MG tablet  Daily        10/06/23 1229    furosemide  (LASIX ) 20 MG tablet  Daily        10/06/23 1229              Ruthe Cornet, DO 10/06/23 1232

## 2023-10-06 NOTE — ED Triage Notes (Addendum)
 Pt reports recent dx with flu x 2 weeks pta, and reports arrhythmia when working out with weights x 2 days pta, has resolved to intermittent, Also reports inability to take deep breath, shob that started x 2 days,  I can't get the air to the bottom of my lungs. Then adds that she rec'd iron  infusion yesterday

## 2023-10-08 ENCOUNTER — Telehealth: Payer: Self-pay | Admitting: *Deleted

## 2023-10-08 ENCOUNTER — Ambulatory Visit: Payer: Medicare Other | Admitting: Pulmonary Disease

## 2023-10-08 ENCOUNTER — Encounter: Payer: Self-pay | Admitting: Pulmonary Disease

## 2023-10-08 VITALS — BP 122/70 | HR 87 | Temp 97.3°F | Ht 66.0 in | Wt 170.0 lb

## 2023-10-08 DIAGNOSIS — I341 Nonrheumatic mitral (valve) prolapse: Secondary | ICD-10-CM

## 2023-10-08 DIAGNOSIS — I340 Nonrheumatic mitral (valve) insufficiency: Secondary | ICD-10-CM

## 2023-10-08 DIAGNOSIS — J90 Pleural effusion, not elsewhere classified: Secondary | ICD-10-CM

## 2023-10-08 DIAGNOSIS — D75839 Thrombocytosis, unspecified: Secondary | ICD-10-CM

## 2023-10-08 MED ORDER — FUROSEMIDE 20 MG PO TABS: 20.0000 mg | ORAL_TABLET | Freq: Every day | ORAL | 0 refills | Status: DC

## 2023-10-08 NOTE — Telephone Encounter (Signed)
 Pt called regarding ED visit and recent scans. She wanted help with explanations. Advised that CT angio did not revel any PE, normal heart size and bilateral pleural effusions. Pt has seen pulmonary provider and has been referred to cardiology. Anything further can be reviewed by the cardiologist. Pt voice understanding.

## 2023-10-08 NOTE — Progress Notes (Signed)
 Subjective:    Patient ID: Kristina Osborne, female    DOB: [birth year], 73 y.o.   MRN: [id number]  Patient Care Team: Roslyn Coombe, MD as PCP - General (Internal Medicine) Maximo Spar Aviva Lemmings, MD as PCP - Cardiology (Cardiology)  Chief Complaint  Patient presents with   Consult    Bilateral Pleural Effusions. ED on 10/06/2023 SOB. No cough or wheezing.     BACKGROUND: Kristina Osborne is a 73 year old remote former smoker with a history of mitral valve prolapse and essential thrombocytosis who presents for evaluation of small bilateral pleural effusions noted on CT performed 06 October 2023.  Patient is referred by the ED department at [facility] in [place].  The patient's primary care physician is Dr. Rosalia Colonel   HPI Discussed the use of AI scribe software for clinical note transcription with the patient, who gave verbal consent to proceed.  History of Present Illness   Kristina Osborne is a 73 year old female with mitral valve prolapse who presents for evaluation of bilateral pleural effusions noted on ED visit of 06 October 2023.  She presents with bilateral pleural effusions identified on a CT scan after experiencing shortness of breath, particularly at night, and tachycardia and palpitations at rest. She finds some relief by sitting up in bed and occasionally can take a deep breath, feeling more comfortable after coughing. She is concerned about waking up short of breath and needing emergency care, especially given her husband's condition at home.  Her symptoms began after receiving a flu vaccine on January 9, followed by contracting a viral illness two weeks later, which she initially recovered from without fever. Her husband experienced a mini-stroke and pneumonia, leading to sepsis, requiring hospitalization. She stayed with him for four nights, during which she began feeling unwell and was diagnosed with influenza type B at urgent care two weeks ago. She was prescribed  Tamiflu and amoxicillin .  She has a history of mitral valve prolapse and PVCs, noting that recent arrhythmias were different from her usual PVCs. She experienced unusual arrhythmias after attempting to exercise, followed by difficulty taking deep breaths.  She became so uncomfortable that she sought emergency care on 06 October 2023.  A chest x-ray done at that time showed no pneumonia, edema pneumothorax or pleural effusion.  She did have prominent interstitial markings and an upper limits of normal cardio pericardial silhouette.  A CT scan in the ER revealed bilateral small pleural effusions, on my independent review there appeared to have been some mild hyperemia of the lungs as well as an upper limits of normal to enlarged cardiac silhouette.  She was discharged home with prescriptions of Lasix  and Zithromax  and advised to follow-up with primary physician and with pulmonary.  She has not had any cough.  She has noted orthopnea and paroxysmal nocturnal dyspnea.  No lower extremity edema.  Feels inability to take a deep breath since symptoms started.  She has been told in the past that she has that degree of mitral regurgitation along with her mitral valve prolapse.  Since her flu diagnosis she has not had any fevers or chills.  Has been under great stress taking care of her husband who had significant issues with a viral illness.  She has not had chest pain per se.  Her past medical history includes mitral valve prolapse, and she is on Hydrea  for JAK2 mutation essential cytosis. She recently received an iron  infusion and is scheduled for another. She has a history of normal echocardiograms  except for the mitral valve prolapse.  Her last echocardiogram was obtained in [place]  years ago.  She used to work in Conservation officer, historic buildings.    Review of Systems A 10 point review of systems was performed and it is as noted above otherwise negative.   Past Medical History:  Diagnosis Date   ABLA (acute  blood loss anemia) 04/16/2022   Congenital malformation of intestinal fixation    Gastritis    Gastroesophageal reflux disease without esophagitis 09/10/2021   IDA (iron  deficiency anemia)    Mitral valve prolapse    Persistent hyperplasia of thymus (HCC)    S/P Thymectomy   Thrombocytosis     Past Surgical History:  Procedure Laterality Date   BIOPSY  06/26/2022   Procedure: BIOPSY;  Surgeon: Elois Hair, MD;  Location: [facility];  Service: Gastroenterology;;   COLONOSCOPY N/A 04/17/2022   Procedure: COLONOSCOPY;  Surgeon: Nannette Babe, MD;  Location: [facility];  Service: Gastroenterology;  Laterality: N/A;   COLONOSCOPY WITH PROPOFOL  N/A 06/26/2022   Procedure: COLONOSCOPY WITH PROPOFOL ;  Surgeon: Elois Hair, MD;  Location: [facility];  Service: Gastroenterology;  Laterality: N/A;   ENTEROSCOPY N/A 04/17/2022   Procedure: ENTEROSCOPY;  Surgeon: Nannette Babe, MD;  Location: [facility];  Service: Gastroenterology;  Laterality: N/A;   ENTEROSCOPY N/A 06/26/2022   Procedure: ENTEROSCOPY;  Surgeon: Elois Hair, MD;  Location: [facility];  Service: Gastroenterology;  Laterality: N/A;   GIVENS CAPSULE STUDY N/A 06/26/2022   Procedure: GIVENS CAPSULE STUDY;  Surgeon: Elois Hair, MD;  Location: [facility];  Service: Gastroenterology;  Laterality: N/A;   HEMOSTASIS CLIP PLACEMENT  04/17/2022   Procedure: HEMOSTASIS CLIP PLACEMENT;  Surgeon: Nannette Babe, MD;  Location: [facility];  Service: Gastroenterology;;   HEMOSTASIS CLIP PLACEMENT  06/26/2022   Procedure: HEMOSTASIS CLIP PLACEMENT;  Surgeon: Elois Hair, MD;  Location: [facility];  Service: Gastroenterology;;   HOT HEMOSTASIS N/A 04/17/2022   Procedure: HOT HEMOSTASIS (ARGON PLASMA COAGULATION/BICAP);  Surgeon: Nannette Babe, MD;  Location: [facility];  Service: Gastroenterology;  Laterality: N/A;   HOT HEMOSTASIS N/A 06/26/2022   Procedure: HOT HEMOSTASIS (ARGON PLASMA  COAGULATION/BICAP);  Surgeon: Elois Hair, MD;  Location: [facility];  Service: Gastroenterology;  Laterality: N/A;   POLYPECTOMY  04/17/2022   Procedure: POLYPECTOMY;  Surgeon: Nannette Babe, MD;  Location: [facility];  Service: Gastroenterology;;   POLYPECTOMY  06/26/2022   Procedure: POLYPECTOMY;  Surgeon: Elois Hair, MD;  Location: [facility];  Service: Gastroenterology;;   SMALL INTESTINE SURGERY  1994   SUBMUCOSAL TATTOO INJECTION  06/26/2022   Procedure: SUBMUCOSAL TATTOO INJECTION;  Surgeon: Elois Hair, MD;  Location: [facility];  Service: Gastroenterology;;   THYMECTOMY N/A     Patient Active Problem List   Diagnosis Date Noted   Posterior neck pain 09/08/2023   Coccyx pain 09/08/2023   LLQ pain 09/08/2023   Acute non-recurrent maxillary sinusitis 08/09/2023   Sinus congestion 08/09/2023   Sinus headache 08/09/2023   Macrocytic anemia 06/25/2022   GI bleed 06/24/2022   Iron  deficiency anemia    Heme positive stool    Jejunal polyp    Benign neoplasm of colon    ABLA (acute blood loss anemia) 04/16/2022   Symptomatic anemia 04/16/2022   GI bleeding 04/15/2022   Right elbow pain 11/06/2021   Palpitations 11/06/2021   Cervical radiculitis 11/02/2021   Need for vaccination 09/10/2021   AVM (arteriovenous malformation) of small bowel,  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   MVP (mitral valve prolapse) 09/10/2021   Gastroesophageal reflux disease without esophagitis 09/10/2021   Acute COVID-19 09/07/2021   Visit for screening mammogram 09/07/2021   Encounter for general adult medical examination with abnormal findings 09/07/2021   Thrombocytosis 11/12/2019    Family History  Problem Relation Age of Onset   Heart disease Mother    Valvular heart disease Mother    Healthy Sister    Alcoholism Half-Sister     Social History   Tobacco Use   Smoking status: Former    Current packs/day: 0.00    Types: Cigarettes    Quit date: 2002    Years since  quitting: 23.1   Smokeless tobacco: Never   Tobacco comments:    Started smoking in her early 20's.    Smoked 0.5PP at her heaviest.   Substance Use Topics   Alcohol use: Not Currently    Allergies  Allergen Reactions   Aspirin Other (See Comments)    "Due to intestinal issue"   Sulfa Antibiotics Other (See Comments)    bleeding    Current Meds  Medication Sig   acetaminophen  (TYLENOL ) 500 MG tablet Take 1,000 mg by mouth every 6 (six) hours as needed for mild pain or headache.   Ascorbic Acid (VITAMIN C) 1000 MG tablet Take 500 mg by mouth in the morning and at bedtime.   azithromycin  (ZITHROMAX ) 250 MG tablet Take first 2 tablets together today, and then take 1 tablet every day until finished.   chlorhexidine  (PERIDEX ) 0.12 % solution 5 mLs by Mouth Rinse route as needed.   cholecalciferol (VITAMIN D3) 25 MCG (1000 UNIT) tablet Take 1,000 Units by mouth daily.   cholestyramine  light (PREVALITE ) 4 g packet Take 1 packet (4 g total) by mouth 2 (two) times daily.   cyclobenzaprine  (FLEXERIL ) 5 MG tablet Take 1 tablet (5 mg total) by mouth 3 (three) times daily as needed for muscle spasms.   famotidine  (PEPCID ) 40 MG tablet TAKE 1 TABLET(40 MG) BY MOUTH DAILY   folic acid  (FOLVITE ) 1 MG tablet Take 1 tablet (1 mg total) by mouth daily.   furosemide  (LASIX ) 20 MG tablet Take 1 tablet (20 mg total) by mouth daily.   glucosamine-chondroitin 500-400 MG tablet Take 1 tablet by mouth 2 (two) times daily.   hydroxyurea  (HYDREA ) 500 MG capsule Take 1 capsule (500 mg total) by mouth 2 (two) times daily.   lidocaine  (LIDODERM ) 5 % Place 1 patch onto the skin daily. Remove & Discard patch within 12 hours or as directed by MD   metoprolol  tartrate (LOPRESSOR ) 25 MG tablet Take 0.5 tablets (12.5 mg total) by mouth 2 (two) times daily. Please call office to schedule an appt for further refills. Thank you   Multiple Vitamins-Minerals (MULTIVITAMIN ADULTS 50+) TABS Take 1 tablet by mouth daily.    Multiple Vitamins-Minerals (PRESERVISION AREDS 2+MULTI VIT PO) Take 1 capsule by mouth 2 (two) times daily.   sucralfate  (CARAFATE ) 1 g tablet TAKE 1 TABLET(1 GRAM) BY MOUTH FOUR TIMES DAILY AT BEDTIME WITH MEALS   tretinoin  (RETIN-A ) 0.05 % cream Apply topically at bedtime. Apply a pea sized amount mixed with a quarter sized amount to the affected area three nights a week   [DISCONTINUED] furosemide  (LASIX ) 20 MG tablet Take 1 tablet (20 mg total) by mouth daily for 5 days.    Immunization History  Administered Date(s) Administered   Fluad Quad(high Dose 65+) 09/26/2023   Influenza-Unspecified 06/28/2021   PNEUMOCOCCAL CONJUGATE-20 09/07/2021  Tdap 03/03/2020      Objective:     BP 122/70 (BP Location: Right Arm, Cuff Size: Normal)   Pulse 87   Temp (!) 97.3 F (36.3 C)   Ht 5\' 6"  (1.676 m)   Wt 170 lb (77.1 kg) Comment: per the patient. Did not want to weigh today.  LMP  (LMP Unknown)   SpO2 100%   BMI 27.44 kg/m   SpO2: 100 % O2 Device: None (Room air)  GENERAL: Well-developed, well-nourished woman, well-groomed presents fully ambulatory no conversational dyspnea. HEAD: Normocephalic, atraumatic.  EYES: Pupils equal, round, reactive to light.  No scleral icterus.  MOUTH: Dentition intact, oral mucosa moist. NECK: Supple. No thyromegaly. Trachea midline. No JVD.  No adenopathy. PULMONARY: Good air entry bilaterally.  No adventitious sounds. CARDIOVASCULAR: S1 and S2. Regular rate and rhythm.  There is a loud grade 3-4/6 mitral regurgitation murmur noted. ABDOMEN:Benign, no overt distention. MUSCULOSKELETAL: No joint deformity, no clubbing, no overt edema.  NEUROLOGIC: No overt focal deficit, no gait disturbance, speech is fluent. SKIN: Intact,warm,dry. PSYCH: Anxious mood, normal behavior.  Recent Results   CBC with Differential (Cancer Center Only)     Status: Abnormal   Collection Time: 10/03/23 12:02 PM  Result Value Ref Range   WBC Count 6.0 4.0 - 10.5 K/uL    RBC 3.16 (L) 3.87 - 5.11 MIL/uL   Hemoglobin 12.8 12.0 - 15.0 g/dL   HCT 16.1 09.6 - 04.5 %   MCV 120.9 (H) 80.0 - 100.0 fL   MCH 40.5 (H) 26.0 - 34.0 pg   MCHC 33.5 30.0 - 36.0 g/dL   RDW 40.9 81.1 - 91.4 %   Platelet Count 600 (H) 150 - 400 K/uL   nRBC 0.0 0.0 - 0.2 %   Neutrophils Relative % 83 %   Neutro Abs 4.9 1.7 - 7.7 K/uL   Lymphocytes Relative 10 %   Lymphs Abs 0.6 (L) 0.7 - 4.0 K/uL   Monocytes Relative 6 %   Monocytes Absolute 0.4 0.1 - 1.0 K/uL   Eosinophils Relative 1 %   Eosinophils Absolute 0.1 0.0 - 0.5 K/uL   Basophils Relative 0 %   Basophils Absolute 0.0 0.0 - 0.1 K/uL   Immature Granulocytes 0 %   Abs Immature Granulocytes 0.02 0.00 - 0.07 K/uL    Comment: Performed at [facility], [address], [place], [place] [address]  CBC with Differential     Status: Abnormal   Collection Time: 10/06/23  8:53 AM  Result Value Ref Range   WBC 5.5 4.0 - 10.5 K/uL   RBC 2.93 (L) 3.87 - 5.11 MIL/uL   Hemoglobin 11.9 (L) 12.0 - 15.0 g/dL   HCT 62.1 (L) 30.8 - 65.7 %   MCV 119.5 (H) 80.0 - 100.0 fL   MCH 40.6 (H) 26.0 - 34.0 pg   MCHC 34.0 30.0 - 36.0 g/dL   RDW 84.6 96.2 - 95.2 %   Platelets 587 (H) 150 - 400 K/uL   nRBC 0.0 0.0 - 0.2 %   Neutrophils Relative % 89 %   Neutro Abs 5.0 1.7 - 7.7 K/uL   Lymphocytes Relative 6 %   Lymphs Abs 0.4 (L) 0.7 - 4.0 K/uL   Monocytes Relative 4 %   Monocytes Absolute 0.2 0.1 - 1.0 K/uL   Eosinophils Relative 1 %   Eosinophils Absolute 0.1 0.0 - 0.5 K/uL   Basophils Relative 0 %   Basophils Absolute 0.0 0.0 - 0.1 K/uL  Immature Granulocytes 0 %   Abs Immature Granulocytes 0.01 0.00 - 0.07 K/uL    Comment: Performed at [facility], [address] [place], [place] [address]  Comprehensive metabolic panel     Status: Abnormal   Collection Time: 10/06/23  8:53 AM  Result Value Ref Range   Sodium 143 135 - 145 mmol/L   Potassium 3.7 3.5 - 5.1 mmol/L   Chloride 107 98 - 111  mmol/L   CO2 23 22 - 32 mmol/L   Glucose, Bld 104 (H) 70 - 99 mg/dL    Comment: Glucose reference range applies only to samples taken after fasting for at least 8 hours.   BUN 11 8 - 23 mg/dL   Creatinine, Ser 4.78 0.44 - 1.00 mg/dL   Calcium  9.8 8.9 - 10.3 mg/dL   Total Protein 6.4 (L) 6.5 - 8.1 g/dL   Albumin 3.9 3.5 - 5.0 g/dL   AST 17 15 - 41 U/L   ALT 15 0 - 44 U/L   Alkaline Phosphatase 58 38 - 126 U/L   Total Bilirubin 0.5 0.0 - 1.2 mg/dL   GFR, Estimated >29 >56 mL/min    Comment: (NOTE) Calculated using the CKD-EPI Creatinine Equation (2021)    Anion gap 13 5 - 15    Comment: Performed at [facility], [address] [place], [place] [address]  D-dimer, quantitative     Status: Abnormal   Collection Time: 10/06/23  8:53 AM  Result Value Ref Range   D-Dimer, Quant 0.85 (H) 0.00 - 0.50 ug/mL-FEU    Comment: (NOTE) At the manufacturer cut-off value of 0.5 g/mL FEU, this assay has a negative predictive value of 95-100%.This assay is intended for use in conjunction with a clinical pretest probability (PTP) assessment model to exclude pulmonary embolism (PE) and deep venous thrombosis (DVT) in outpatients suspected of PE or DVT. Results should be correlated with clinical presentation. Performed at [facility], [address], [place], [place] [address]   Brain natriuretic peptide     Status: Abnormal   Collection Time: 10/06/23  8:53 AM  Result Value Ref Range   B Natriuretic Peptide 195.5 (H) 0.0 - 100.0 pg/mL    Comment: Performed at [facility], [address] [place], [place] [address]  Troponin I (High Sensitivity)     Status: None   Collection Time: 10/06/23  8:53 AM  Result Value Ref Range   Troponin I (High Sensitivity) 4 <18 ng/L    Comment: (NOTE) Elevated high sensitivity troponin I (hsTnI) values and significant  changes across serial measurements may suggest ACS but many other  chronic and  acute conditions are known to elevate hsTnI results.  Refer to the "Links" section for chest pain algorithms and additional  guidance. Performed at [facility], [address] [place], [place] [address]       Assessment & Plan:     ICD-10-CM   1. Mitral valve insufficiency, unspecified etiology  I34.0 Ambulatory referral to Cardiology    ECHOCARDIOGRAM COMPLETE    2. MVP (mitral valve prolapse)  I34.1 Ambulatory referral to Cardiology    ECHOCARDIOGRAM COMPLETE    3. Pleural effusion, bilateral  J90     4. Thrombocytosis  D75.839       Orders Placed This Encounter  Procedures   Ambulatory referral to Cardiology    Referral Priority:   Urgent    Referral Type:   Consultation    Referral Reason:   Specialty  Services Required    Number of Visits Requested:   1   ECHOCARDIOGRAM COMPLETE    Patient with prior history of mitral valve prolapse now with LOUD mitral regurgitation murmur.    Standing Status:   Future    Expiration Date:   10/07/2024    Where should this test be performed:   [facility])    Does the patient weigh less than or greater than 250 lbs?:   Patient weighs less than 250 lbs    Perflutren DEFINITY (image enhancing agent) should be administered unless hypersensitivity or allergy exist:   Administer Perflutren    Reason for exam-Echo:   Mitral Valve Disorder  I05.9    Meds ordered this encounter  Medications   furosemide  (LASIX ) 20 MG tablet    Sig: Take 1 tablet (20 mg total) by mouth daily.    Dispense:  15 tablet    Refill:  0   Discussion:    Bilateral Pleural Effusions Bilateral small pleural effusions identified on CT scan, likely secondary to systemic disease.  Small and not amenable to thoracentesis.  Differential diagnosis includes cardiac, renal and hepatic etiologies.  Renal function and hepatic function are normal. Discussed potential impact of viral infection on heart muscle strength.  However,  concern is that of worsening mitral regurgitation.  The patient does have a history of mitral valve prolapse however she does exhibit a very LOUD murmur consistent with mitral regurgitation noted on exam today. - Order echocardiogram ASAP - Refer to cardiology (last seen 2023 by Dr. Maximo Spar however, patient did not follow-up) - Continue Lasix  20 mg daily until seen by cardiology - Advise ED visit if symptoms worsen - Advise limiting physical activity until further evaluation  Mitral Valve Prolapse Mitral valve prolapse with associated PVCs and recent increase in arrhythmias and dyspnea. Possible worsening of mitral valve function, potentially exacerbated by recent viral infection. Discussed potential for mitral valve incompetence and surgical repair options, versus potential valve replacement.  Patient concerned due to issues with AVMs chronic GI blood loss.  Advised that bioprosthetic valves do not require anticoagulation. - Evaluate mitral valve function with echocardiogram - Refer to cardiology for further management  General Health Maintenance Received flu vaccine on September 06, 2023. - Ensure follow-up with primary care for routine health maintenance  Follow-up - Scheduled echocardiogram for October 11, 2023 - Coordinate with cardiology for follow-up appointment - Ensure insurance precertification for echocardiogram.   Will see the patient in follow-up in 3 to 4 weeks time she is to contact us  prior to that time should any new difficulties arise.  Advised if symptoms do not improve or worsen, to please contact office for sooner follow up or seek emergency care.    I spent 75 minutes of dedicated to the care of this patient on the date of this encounter to include pre-visit review of records, face-to-face time with the patient discussing conditions above, post visit ordering of testing, clinical documentation with the electronic health record, making appropriate referrals as documented,  and communicating necessary findings to members of the patients care team.   C. Chloe Counter, MD [facility]-    *This note was dictated using voice recognition software/Dragon.  Despite best efforts to proofread, errors can occur which can change the meaning. Any transcriptional errors that result from this process are unintentional and may not be fully corrected at the time of dictation.

## 2023-10-08 NOTE — Patient Instructions (Signed)
 VISIT SUMMARY:  Kristina Osborne, a 73 year old female with a history of mitral valve prolapse, presented with shortness of breath and tachycardia. A CT scan revealed bilateral pleural effusions. Her symptoms began after a flu vaccine and a subsequent viral illness. She has been treated with Lasix  and Zithromax  and has a history of mitral valve prolapse and PVCs. Recent tests showed normal heart, kidney, and liver function.  YOUR PLAN:  -BILATERAL PLEURAL EFFUSIONS: Bilateral pleural effusions are a buildup of fluid in the space around the lungs, likely due to a systemic disease. We will perform an echocardiogram to check your heart function, refer you to a cardiologist, and continue your Lasix  20 mg. Please visit the emergency department if your symptoms worsen and limit physical activity until further evaluation.  -MITRAL VALVE PROLAPSE: Mitral valve prolapse is a condition where the valve between your heart's left upper chamber and left lower chamber doesn't close properly. We will evaluate your mitral valve function with an echocardiogram and refer you to a cardiologist for further management.  -GENERAL HEALTH MAINTENANCE: You received a flu vaccine on September 06, 2023. Please ensure you follow up with your primary care provider for routine health maintenance.  INSTRUCTIONS:  Please schedule an echocardiogram for October 11, 2023, and coordinate with a cardiologist for a follow-up appointment. Ensure insurance precertification for the echocardiogram.

## 2023-10-09 ENCOUNTER — Telehealth: Payer: Self-pay | Admitting: Internal Medicine

## 2023-10-09 NOTE — Telephone Encounter (Signed)
Patient has not seen Dr. Rennis Golden since 2023. She has an upcoming appointment with MD in March. She has an echo ordered by pulm, scheduled on 2/13

## 2023-10-09 NOTE — Telephone Encounter (Signed)
Returned pt's call. She states "I don't want my results to go to pulmonary.  I don't want to talk to her. She has told me I have an enlarged heart. And I told my other doctor and he said they same thing. "What is she talking about?" I think she got joy out of scaring the hell out of me. I am done with her, I do not want my results from her." Pt informed the results will automatically go the that office since they ordered the test however at her appt with Dr. Rennis Golden they should be able to discuss the results. Pt will keep her appt for March 7th with Dr. Rennis Golden. Echo is scheduled for Thursday Feb 13th.

## 2023-10-09 NOTE — Telephone Encounter (Signed)
Patient would like Dr. Rennis Golden to review her EKG. Patient would like to know his opinion on the EKG results. Please advise.

## 2023-10-10 NOTE — Telephone Encounter (Signed)
Kristina Nose, MD  [place], [place] hours ago (10:28 AM)    Ok thanks .Marland Kitchen The echo is important as it will let us know more clearly "why" the heart was felt to be enlarged and I will discuss it with her in the office in March.    Spoke with patient about testing ordered. Explained that her pulmonologist note AND diagnosis code associated w/echo test, that the indication for the test is to assess her mitral valve. She was concerned that the echo test may have some abnormal findings that would warrant immediate follow up or an ED visit.   Advised that she can have pulmonologist sent a copy of the report (once read) to Dr. Rennis Golden OR she can send [place] a notice in MyChart once the test is done/read and see if MD can review.   Advised he said he would review with her in detail at her March visit.

## 2023-10-11 ENCOUNTER — Telehealth: Payer: Self-pay | Admitting: Internal Medicine

## 2023-10-11 ENCOUNTER — Ambulatory Visit (HOSPITAL_COMMUNITY): Payer: Medicare Other | Attending: Cardiology

## 2023-10-11 ENCOUNTER — Encounter: Payer: Self-pay | Admitting: Internal Medicine

## 2023-10-11 DIAGNOSIS — I341 Nonrheumatic mitral (valve) prolapse: Secondary | ICD-10-CM

## 2023-10-11 DIAGNOSIS — Z01812 Encounter for preprocedural laboratory examination: Secondary | ICD-10-CM

## 2023-10-11 DIAGNOSIS — I340 Nonrheumatic mitral (valve) insufficiency: Secondary | ICD-10-CM | POA: Diagnosis not present

## 2023-10-11 LAB — ECHOCARDIOGRAM COMPLETE
Area-P 1/2: 4.96 cm2
MV M vel: 4.03 m/s
MV Peak grad: 65 mmHg
Radius: 0.8 cm

## 2023-10-11 NOTE — Telephone Encounter (Signed)
Pt wants Dr Rennis Golden to look at her Echo results and tell her if there is anything she needs to be worried about

## 2023-10-12 ENCOUNTER — Telehealth: Payer: Self-pay | Admitting: *Deleted

## 2023-10-12 ENCOUNTER — Telehealth: Payer: Self-pay | Admitting: Internal Medicine

## 2023-10-12 ENCOUNTER — Ambulatory Visit: Payer: Medicare Other

## 2023-10-12 VITALS — BP 104/63 | HR 57 | Temp 97.9°F | Resp 18 | Ht 66.0 in

## 2023-10-12 DIAGNOSIS — D500 Iron deficiency anemia secondary to blood loss (chronic): Secondary | ICD-10-CM | POA: Diagnosis not present

## 2023-10-12 DIAGNOSIS — K5520 Angiodysplasia of colon without hemorrhage: Secondary | ICD-10-CM

## 2023-10-12 MED ORDER — SODIUM CHLORIDE 0.9 % IV SOLN
510.0000 mg | Freq: Once | INTRAVENOUS | Status: AC
  Administered 2023-10-12: 510 mg via INTRAVENOUS
  Filled 2023-10-12: qty 17

## 2023-10-12 MED ORDER — METOPROLOL TARTRATE 25 MG PO TABS: ORAL_TABLET | ORAL | 3 refills | Status: DC

## 2023-10-12 NOTE — Telephone Encounter (Signed)
Received call from pt. She is calling because she had an echocardiogram and she had developed severe MVR and needs a mitral valve replacement. Due to additional upcoming appts she needs to change a lab appt. For next week.  Scheduling message sent to change lab from 10/17/23 to 10/16/23. She is having a TEE on 10/17/23. Advised we will work with her regarding her hydroxyurea prior to her surgery. Pt voiced understanding.

## 2023-10-12 NOTE — Telephone Encounter (Signed)
Spoke to patient Dr.Hilty's advice given.She will start Metoprolol Tart 25 mg 1/2 tablet twice a day.

## 2023-10-12 NOTE — Telephone Encounter (Signed)
Message sent to Dr.Hilty for advice.

## 2023-10-12 NOTE — Telephone Encounter (Signed)
Spoke to patient appointment scheduled with Reather Littler NP 2/19 at 11:20 am to discuss TEE and give instructions.TEE scheduled with Dr.Hilty 2/26 at [facility] arrive at 7:30 am.Advised she will have cbc,bmet after 2/19 office visit.

## 2023-10-12 NOTE — Telephone Encounter (Signed)
Spoke to patient she stated she is having frequent PVC's.She has not been taking Metoprolol.Stated she has not taken Metoprolol in over 1 year.She is afraid to take  due to her mitral valve.Advised she should be taking Metoprolol Tartrate 25 mg 1/2 tablet twice a day.Advised I will send message to Dr.Hilty

## 2023-10-12 NOTE — Telephone Encounter (Signed)
Pt states her bp is 104/60 and asked if its safe to take metoprolol.

## 2023-10-12 NOTE — Telephone Encounter (Signed)
Patient is requesting a call back to discuss results. She says she doesn't have access to MyChart but her doctor called her and suggested she bee seen sooner due to mitral valve.

## 2023-10-12 NOTE — Telephone Encounter (Signed)
See previous note

## 2023-10-12 NOTE — Progress Notes (Signed)
Diagnosis: Iron Deficiency Anemia  Provider:  Chilton Greathouse MD  Procedure: IV Infusion  IV Type: Peripheral, IV Location: R Forearm  Feraheme (Ferumoxytol), Dose: 510 mg  Infusion Start Time: 1523  Infusion Stop Time: 1540  Post Infusion IV Care: Patient declined observation and Peripheral IV Discontinued  Discharge: Condition: Good, Destination: Home . AVS Declined  Performed by:  Garnette Czech, RN

## 2023-10-12 NOTE — Telephone Encounter (Signed)
Pt was to know if she can take something for arrhythmia

## 2023-10-15 ENCOUNTER — Emergency Department (HOSPITAL_COMMUNITY)
Admission: EM | Admit: 2023-10-15 | Discharge: 2023-10-16 | Disposition: A | Discharge status: Home / Self Care | Payer: Medicare Other | Attending: Emergency Medicine | Admitting: Emergency Medicine

## 2023-10-15 ENCOUNTER — Telehealth: Payer: Self-pay | Admitting: Internal Medicine

## 2023-10-15 ENCOUNTER — Emergency Department (HOSPITAL_COMMUNITY): Payer: Medicare Other

## 2023-10-15 ENCOUNTER — Other Ambulatory Visit: Payer: Self-pay

## 2023-10-15 ENCOUNTER — Telehealth: Payer: Self-pay | Admitting: Hematology and Oncology

## 2023-10-15 DIAGNOSIS — K2900 Acute gastritis without bleeding: Secondary | ICD-10-CM | POA: Diagnosis not present

## 2023-10-15 DIAGNOSIS — R1013 Epigastric pain: Secondary | ICD-10-CM | POA: Diagnosis present

## 2023-10-15 DIAGNOSIS — R002 Palpitations: Secondary | ICD-10-CM | POA: Insufficient documentation

## 2023-10-15 LAB — CBC WITH DIFFERENTIAL/PLATELET
Abs Immature Granulocytes: 0.02 10*3/uL (ref 0.00–0.07)
Basophils Absolute: 0 10*3/uL (ref 0.0–0.1)
Basophils Relative: 1 %
Eosinophils Absolute: 0 10*3/uL (ref 0.0–0.5)
Eosinophils Relative: 0 %
HCT: 35.8 % — ABNORMAL LOW (ref 36.0–46.0)
Hemoglobin: 12.2 g/dL (ref 12.0–15.0)
Immature Granulocytes: 1 %
Lymphocytes Relative: 6 %
Lymphs Abs: 0.2 10*3/uL — ABNORMAL LOW (ref 0.7–4.0)
MCH: 41.1 pg — ABNORMAL HIGH (ref 26.0–34.0)
MCHC: 34.1 g/dL (ref 30.0–36.0)
MCV: 120.5 fL — ABNORMAL HIGH (ref 80.0–100.0)
Monocytes Absolute: 0.3 10*3/uL (ref 0.1–1.0)
Monocytes Relative: 8 %
Neutro Abs: 3 10*3/uL (ref 1.7–7.7)
Neutrophils Relative %: 84 %
Platelets: 663 10*3/uL — ABNORMAL HIGH (ref 150–400)
RBC: 2.97 MIL/uL — ABNORMAL LOW (ref 3.87–5.11)
RDW: 14.1 % (ref 11.5–15.5)
WBC: 3.5 10*3/uL — ABNORMAL LOW (ref 4.0–10.5)
nRBC: 0 % (ref 0.0–0.2)

## 2023-10-15 LAB — COMPREHENSIVE METABOLIC PANEL
ALT: 25 U/L (ref 0–44)
AST: 30 U/L (ref 15–41)
Albumin: 3.7 g/dL (ref 3.5–5.0)
Alkaline Phosphatase: 47 U/L (ref 38–126)
Anion gap: 13 (ref 5–15)
BUN: 17 mg/dL (ref 8–23)
CO2: 20 mmol/L — ABNORMAL LOW (ref 22–32)
Calcium: 9.3 mg/dL (ref 8.9–10.3)
Chloride: 107 mmol/L (ref 98–111)
Creatinine, Ser: 0.73 mg/dL (ref 0.44–1.00)
GFR, Estimated: >60 mL/min (ref >60)
Glucose, Bld: 106 mg/dL — ABNORMAL HIGH (ref 70–99)
Potassium: 3 mmol/L — ABNORMAL LOW (ref 3.5–5.1)
Sodium: 140 mmol/L (ref 135–145)
Total Bilirubin: 0.9 mg/dL (ref 0.0–1.2)
Total Protein: 6.4 g/dL — ABNORMAL LOW (ref 6.5–8.1)

## 2023-10-15 LAB — MAGNESIUM: Magnesium: 1.7 mg/dL (ref 1.7–2.4)

## 2023-10-15 LAB — LIPASE, BLOOD: Lipase: 34 U/L (ref 11–51)

## 2023-10-15 MED ORDER — ACETAMINOPHEN 500 MG PO TABS
1000.0000 mg | ORAL_TABLET | Freq: Once | ORAL | Status: AC
  Administered 2023-10-15: 1000 mg via ORAL
  Filled 2023-10-15: qty 2

## 2023-10-15 MED ORDER — SODIUM CHLORIDE 0.9 % IV BOLUS
1000.0000 mL | Freq: Once | INTRAVENOUS | Status: AC
  Administered 2023-10-15: 1000 mL via INTRAVENOUS

## 2023-10-15 MED ORDER — IOHEXOL 350 MG/ML SOLN
75.0000 mL | Freq: Once | INTRAVENOUS | Status: AC | PRN
  Administered 2023-10-15: 75 mL via INTRAVENOUS

## 2023-10-15 MED ORDER — ALUM & MAG HYDROXIDE-SIMETH 200-200-20 MG/5ML PO SUSP
30.0000 mL | Freq: Once | ORAL | Status: AC
  Administered 2023-10-15: 30 mL via ORAL
  Filled 2023-10-15: qty 30

## 2023-10-15 MED ORDER — LIDOCAINE VISCOUS HCL 2 % MT SOLN
15.0000 mL | Freq: Once | OROMUCOSAL | Status: AC
  Administered 2023-10-15: 15 mL via ORAL
  Filled 2023-10-15: qty 15

## 2023-10-15 NOTE — Telephone Encounter (Signed)
Rescheduled LAB appointment per conflicting appointments. Patient is aware of the changes made.

## 2023-10-15 NOTE — ED Triage Notes (Signed)
BIB [facility] for Gastritis and palpitations that started today. Pt has recently been dx with flu. Pt denies any problems breathing and states that nausea and vomiting are better since EMS admin 500 mL bolus of NS and 4 mg of Zofran.

## 2023-10-15 NOTE — Telephone Encounter (Signed)
Patient is calling in to request some medication for her stomach cramping called bentyl she is in pain and can not put anything on her stomach

## 2023-10-15 NOTE — Telephone Encounter (Signed)
Spoke to patient she stated she has been nauseated since last night.Stated she has vomited x 1.Advised to call PCP.

## 2023-10-15 NOTE — Telephone Encounter (Signed)
Spoke to patient Dr.Hilty's advice given.Advised to keep appointment with Reather Littler NP 2/19 at 11:20 am.

## 2023-10-15 NOTE — Telephone Encounter (Signed)
Pt c/o medication issue:  1. Name of Medication: Something for upset stomach   2. How are you currently taking this medication (dosage and times per day)?   3. Are you having a reaction (difficulty breathing--STAT)?   4. What is your medication issue?   Patient states that she has been nauseous and has vomited and would like a call back to discuss if this could be from heart condition of if this is a stomach bug. She states she would like to discuss getting medication to help with this issue.

## 2023-10-16 ENCOUNTER — Inpatient Hospital Stay: Payer: Medicare Other

## 2023-10-16 ENCOUNTER — Telehealth: Payer: Self-pay | Admitting: *Deleted

## 2023-10-16 DIAGNOSIS — K2900 Acute gastritis without bleeding: Secondary | ICD-10-CM | POA: Diagnosis not present

## 2023-10-16 MED ORDER — ONDANSETRON 4 MG PO TBDP: 4.0000 mg | ORAL_TABLET | Freq: Three times a day (TID) | ORAL | 0 refills | Status: DC | PRN

## 2023-10-16 MED ORDER — POTASSIUM CHLORIDE CRYS ER 20 MEQ PO TBCR
40.0000 meq | EXTENDED_RELEASE_TABLET | Freq: Once | ORAL | Status: AC
  Administered 2023-10-16: 40 meq via ORAL
  Filled 2023-10-16: qty 2

## 2023-10-16 MED ORDER — DICYCLOMINE HCL 10 MG PO CAPS
10.0000 mg | ORAL_CAPSULE | Freq: Once | ORAL | Status: AC
  Administered 2023-10-16: 10 mg via ORAL
  Filled 2023-10-16: qty 1

## 2023-10-16 MED ORDER — DICYCLOMINE HCL 20 MG PO TABS: 20.0000 mg | ORAL_TABLET | Freq: Two times a day (BID) | ORAL | 0 refills | Status: DC | PRN

## 2023-10-16 MED ORDER — ONDANSETRON HCL 4 MG/2ML IJ SOLN
4.0000 mg | Freq: Once | INTRAMUSCULAR | Status: AC
  Administered 2023-10-16: 4 mg via INTRAVENOUS
  Filled 2023-10-16: qty 2

## 2023-10-16 MED ORDER — DICYCLOMINE HCL 20 MG PO TABS: 20.0000 mg | ORAL_TABLET | Freq: Three times a day (TID) | ORAL | 1 refills | Status: DC | PRN

## 2023-10-16 NOTE — Progress Notes (Unsigned)
Cardiology Office Note    Date:  10/17/2023  ID:  Kristina Osborne, DOB 06-14-1951, MRN [id number] PCP:  Corwin Levins, MD  Cardiologist:  [name], MD  Electrophysiologist:  None   Chief Complaint: Follow up for mitral valve prolapse, palpitations   History of Present Illness: .    Kristina Osborne is a 73 y.o. female with visit-pertinent history of mitral valve prolapse, palpitations, PVCs.  Patient first evaluated by Dr. Rennis Golden on 10/07/2021, per notes patient had a remote history of an echocardiogram as well as stress testing greater than 7 years ago that was reassuring.  She was noted to have some mild regurgitation and prolapse of the mitral valve.  It was noted that there is a family history of heart disease in her mother who had a CABG and died of aortic aneurysm.  Given very frequent PVCs she is previous switch from short acting propranolol to metoprolol tartrate 12.5 mg twice daily.  She was then lost to follow-up.  On 10/11/2023 she underwent echocardiogram that indicated LVEF of 60 to 65%, LV with normal function, no RWMA, diastolic parameters were indeterminate.  RV systolic function and size was normal.  The left atrial size was severely dilated.  The mitral valve was markedly abnormal, there was moderate calcification and deformity of the posterior leaflet with at least moderate prolapse.  There was severe posterior directed MR filling the entire LA, the mitral valve is abnormal, severe mitral valve regurgitation, no evidence of mitral stenosis, there is prolapse of multiple scallops of the posterior leaflet of the mitral valve.  TEE was recommended for further evaluation.  On chart review patient notified the office of increased arrhythmias, she was restarted on metoprolol tartrate 12.5 mg twice daily.  On 08/14/2024 patient called paramedics for palpitations.  She is found to have rapid heartbeat, she received IV fluids and antinausea medication with paramedics and reported  overall feeling better.  She noted epigastric pain that felt similar to her chronic gastritis.  It was noted that it was difficult to discern if patient was having episode of atrial fibrillation versus sinus tachycardia with PACs, ECG in the ER indicated normal rhythm.  Today she presents for follow-up.  She reports that she is doing ok.  She notes that she has had increased stress at home.  She notes that in January she and her husband just developed the flu, following her husband reportedly had a stroke.  She has now been assisting in caring for him.  She notes that recently she has been increasingly fatigued and short of breath, improved with Lasix use.  She denies chest pain, lower extremity edema, orthopnea or PND.  She notes that in the last week she started having diarrhea and GI upset, she plans to follow-up with her PCP if symptoms are ongoing.  She notes that her blood pressure has been running slightly lower than her normal, she is notes typically her systolic blood pressure will run between 100-1 10, has been consistently in the 90s.  She denies dizziness, lightheadedness, presyncope or syncope.  She notes that she did not start on metoprolol tartrate.  Labwork independently reviewed: 10/15/2023: Sodium 140, potassium 3.0, creatinine 0.73, AST 30, ALT 25, WBC 3.5, RBC 2.97, hemoglobin 12.2, hematocrit 35.8  ROS: .   Today she denies chest pain, lower extremity edema, melena, hematuria, hemoptysis, diaphoresis, weakness, presyncope, syncope, orthopnea, and PND.  All other systems are reviewed and otherwise negative. Studies Reviewed: Marland Kitchen    EKG:  EKG is ordered today, personally reviewed, demonstrating  EKG Interpretation Date/Time:  Wednesday October 17 2023 11:15:20 EST Ventricular Rate:  76 PR Interval:  142 QRS Duration:  88 QT Interval:  410 QTC Calculation: 461 R Axis:   30  Text Interpretation: Normal sinus rhythm Possible Left atrial enlargement Confirmed by Reather Littler [phone])  on 10/17/2023 4:03:27 PM   CV Studies:  Cardiac Studies & Procedures   ______________________________________________________________________________________________     ECHOCARDIOGRAM  ECHOCARDIOGRAM COMPLETE 10/11/2023  Narrative ECHOCARDIOGRAM REPORT    Patient Name:   Kristina Osborne Date of Exam: 10/11/2023 Medical Rec #:  [id number]         Height:       66.0 in Accession #:    [id number]        Weight:       170.0 lb Date of Birth:  11-16-1950         BSA:          1.866 m Patient Age:    72 years          BP:           122/70 mmHg Patient Gender: F                 HR:           87 bpm. Exam Location:  [address]  Procedure: 2D Echo, 3D Echo and Strain Analysis (Both Spectral and Color Flow Doppler were utilized during procedure).  Indications:    I05.9 Mitral valve disorder  History:        Patient has no prior history of Echocardiogram examinations. Mitral Valve Prolapse, Arrythmias:PVC; Signs/Symptoms:Shortness of Breath. Tachycardia. Palpitations. Pleural effusions.  Sonographer:    Cathie Beams RCS Referring Phys: [id number] CARMEN L GONZALEZ  IMPRESSIONS   1. Left ventricular ejection fraction, by estimation, is 60 to 65%. Left ventricular ejection fraction by 3D volume is 62 %. The left ventricle has normal function. The left ventricle has no regional wall motion abnormalities. Left ventricular diastolic parameters are indeterminate. The average left ventricular global longitudinal strain is -25.9 %. The global longitudinal strain is normal. 2. Right ventricular systolic function is normal. The right ventricular size is normal. 3. Left atrial size was severely dilated. 4. The MV is markedly abnormal. There is moderate calcification and deformity of the posterior leaflet with at least moderate prolapse. There is very severe posteriorly directed MR filling the entire LA. The mitral valve is abnormal. Severe mitral valve regurgitation. No evidence of mitral  stenosis. There is prolapse of multiple scallops of the posterior leaflet of the mitral valve. 5. The aortic valve is normal in structure. Aortic valve regurgitation is not visualized. Aortic valve sclerosis/calcification is present, without any evidence of aortic stenosis. 6. The inferior vena cava is normal in size with greater than 50% respiratory variability, suggesting right atrial pressure of 3 mmHg.  Conclusion(s)/Recommendation(s): Suggest TEE to further evalaute the mitral valve.  FINDINGS Left Ventricle: Left ventricular ejection fraction, by estimation, is 60 to 65%. Left ventricular ejection fraction by 3D volume is 62 %. The left ventricle has normal function. The left ventricle has no regional wall motion abnormalities. The average left ventricular global longitudinal strain is -25.9 %. Strain was performed and the global longitudinal strain is normal. The left ventricular internal cavity size was normal in size. There is no left ventricular hypertrophy. Left ventricular diastolic parameters are indeterminate.  Right Ventricle: The right ventricular size is normal. No increase in right ventricular  wall thickness. Right ventricular systolic function is normal.  Left Atrium: Left atrial size was severely dilated.  Right Atrium: Right atrial size was normal in size.  Pericardium: There is no evidence of pericardial effusion.  Mitral Valve: The MV is markedly abnormal. There is moderate calcification and deformity of the posterior leaflet with at least moderate prolapse. There is very severe posteriorly directed MR filling the entire LA. The mitral valve is abnormal. There is prolapse of multiple scallops of the posterior leaflet of the mitral valve. There is moderate thickening of the mitral valve leaflet(s). There is moderate calcification of the mitral valve leaflet(s). Mild mitral annular calcification. Severe mitral valve regurgitation. No evidence of mitral valve  stenosis.  Tricuspid Valve: The tricuspid valve is normal in structure. Tricuspid valve regurgitation is mild . No evidence of tricuspid stenosis.  Aortic Valve: The aortic valve is normal in structure. Aortic valve regurgitation is not visualized. Aortic valve sclerosis/calcification is present, without any evidence of aortic stenosis.  Pulmonic Valve: The pulmonic valve was normal in structure. Pulmonic valve regurgitation is trivial. No evidence of pulmonic stenosis.  Aorta: The aortic root is normal in size and structure.  Venous: The inferior vena cava is normal in size with greater than 50% respiratory variability, suggesting right atrial pressure of 3 mmHg.  IAS/Shunts: No atrial level shunt detected by color flow Doppler.  Additional Comments: 3D was performed not requiring image post processing on an independent workstation and was normal.   LEFT VENTRICLE PLAX 2D LVIDd:         5.30 cm         Diastology LV PW:         1.00 cm         LV e' medial:    6.96 cm/s LV IVS:        0.80 cm         LV E/e' medial:  16.7 LVOT diam:     2.00 cm         LV e' lateral:   11.00 cm/s LV SV:         49              LV E/e' lateral: 10.5 LV SV Index:   26 LVOT Area:     3.14 cm        2D Longitudinal Strain 2D Strain GLS  -25.9 % Avg:  3D Volume EF LV 3D EF:    Left ventricul ar ejection fraction by 3D volume is 62 %.  3D Volume EF: 3D EF:        62 % LV EDV:       151 ml LV ESV:       58 ml LV SV:        93 ml  RIGHT VENTRICLE RV Basal diam:  3.10 cm RV S prime:     12.20 cm/s TAPSE (M-mode): 2.0 cm  LEFT ATRIUM             Index        RIGHT ATRIUM           Index LA diam:        4.80 cm 2.57 cm/m   RA Area:     12.10 cm LA Vol (A2C):   90.6 ml 48.54 ml/m  RA Volume:   25.10 ml  13.45 ml/m LA Vol (A4C):   79.9 ml 42.81 ml/m LA Biplane Vol: 92.0 ml 49.29 ml/m AORTIC VALVE LVOT Vmax:  97.87 cm/s LVOT Vmean:  55.833 cm/s LVOT VTI:    0.155 m  AORTA Ao  Root diam: 2.90 cm  MITRAL VALVE MV Area (PHT): 4.96 cm       SHUNTS MV Decel Time: 153 msec       Systemic VTI:  0.16 m MR Peak grad:    65.0 mmHg    Systemic Diam: 2.00 cm MR Mean grad:    39.0 mmHg MR Vmax:         403.00 cm/s MR Vmean:        297.0 cm/s MR PISA:         4.02 cm MR PISA Eff ROA: 40 mm MR PISA Radius:  0.80 cm MV E velocity: 116.00 cm/s MV A velocity: 58.00 cm/s MV E/A ratio:  2.00  Arvilla Meres MD Electronically signed by Arvilla Meres MD Signature Date/Time: 10/11/2023/3:18:33 PM    Final          ______________________________________________________________________________________________        Current Reported Medications:.    Current Meds  Medication Sig   acetaminophen (TYLENOL) 500 MG tablet Take 1,000 mg by mouth every 6 (six) hours as needed for mild pain or headache.   Ascorbic Acid (VITAMIN C) 1000 MG tablet Take 500 mg by mouth in the morning and at bedtime.   cholecalciferol (VITAMIN D3) 25 MCG (1000 UNIT) tablet Take 1,000 Units by mouth daily.   famotidine (PEPCID) 40 MG tablet TAKE 1 TABLET(40 MG) BY MOUTH DAILY   folic acid (FOLVITE) 1 MG tablet Take 1 tablet (1 mg total) by mouth daily.   furosemide (LASIX) 20 MG tablet Take 1 tablet (20 mg total) by mouth daily.   glucosamine-chondroitin 500-400 MG tablet Take 1 tablet by mouth 2 (two) times daily.   hydroxyurea (HYDREA) 500 MG capsule Take 1 capsule (500 mg total) by mouth 2 (two) times daily.   metoprolol tartrate (LOPRESSOR) 25 MG tablet Take 1/2 tablet ( 12.5 mg ) twice a day   Multiple Vitamins-Minerals (MULTIVITAMIN ADULTS 50+) TABS Take 1 tablet by mouth daily.   Multiple Vitamins-Minerals (PRESERVISION AREDS 2+MULTI VIT PO) Take 1 capsule by mouth 2 (two) times daily.   ondansetron (ZOFRAN-ODT) 4 MG disintegrating tablet Take 1 tablet (4 mg total) by mouth every 8 (eight) hours as needed for nausea or vomiting.   sucralfate (CARAFATE) 1 g tablet TAKE 1 TABLET(1  GRAM) BY MOUTH FOUR TIMES DAILY AT BEDTIME WITH MEALS   Physical Exam:    VS:  BP 98/62   Pulse 76   Ht 5\' 6"  (1.676 m)   Wt 146 lb 6.4 oz (66.4 kg)   LMP  (LMP Unknown)   SpO2 98%   BMI 23.63 kg/m    Wt Readings from Last 3 Encounters:  10/17/23 146 lb 6.4 oz (66.4 kg)  10/15/23 170 lb (77.1 kg)  10/08/23 170 lb (77.1 kg)   GEN: Well nourished, well developed in no acute distress NECK: No JVD; No carotid bruits CARDIAC: RRR, 3-4/6 systolic murmur, no rubs or gallops RESPIRATORY:  Overall clear to auscultation, minimal crackles in left lower base without rales, wheezing or rhonchi  ABDOMEN: Soft, non-tender, non-distended EXTREMITIES:  No edema; No acute deformity  Asessement and Plan:Marland Kitchen    Mitral valve prolapse/severe MR: On 10/11/2023 she underwent echocardiogram that indicated LVEF of 60 to 65%, LV with normal function, no RWMA, diastolic parameters were indeterminate. The mitral valve was markedly abnormal, there was moderate calcification and deformity of the posterior leaflet  with at least moderate prolapse.  There was severe posterior directed MR filling the entire LA, the mitral valve is abnormal, severe mitral valve regurgitation, no evidence of mitral stenosis, there is prolapse of multiple scallops of the posterior leaflet of the mitral valve.  TEE was recommended for further evaluation.  Today she notes that she has been having increased fatigue and some shortness of breath, she is currently on Lasix for bilateral pleural effusions, notes that this has been slowly improving her breathing.  She denies lower extremity edema, orthopnea or PND. Patient scheduled to undergo TEE on 2/26, discussed indication and likely referral to CT surgery, patient expressed her understanding and agrees to proceed, please see consent below.  Informed Consent   Shared Decision Making/Informed Consent   The risks [esophageal damage, perforation (1:10,000 risk), bleeding, pharyngeal hematoma as well as  other potential complications associated with conscious sedation including aspiration, arrhythmia, respiratory failure and death], benefits (treatment guidance and diagnostic support) and alternatives of a transesophageal echocardiogram were discussed in detail with Kristina Osborne and she is willing to proceed.       Palpitations: Patient with history of frequent PVCs.  Recently presented to the ED with increased palpitations in setting of acute gastritis. It was noted that it was difficult to discern if patient was having episode of atrial fibrillation versus sinus tachycardia with PACs, ECG in the ER indicated normal rhythm.  Patient's potassium level was also 3.0.  Patient notes that she does have occasional PVCs, she denies any further sustained palpitations.  She notes that she did not start on metoprolol tartrate, given lower blood pressures she deferred at this time.  Given recent episodes of increased palpitations will have patient wear 2-week cardiac monitor following her TEE to evaluate for arrhythmia.  Check CBC, BMET and TSH today.  Reviewed ED precautions.  Hypokalemia: Potassium 3.0 on 10/15/23.  Treated with potassium chloride 40 meq while in the ED. Check BMET today.   Pleural effusions: Patient noted to have bilateral small pleural effusions identified on CT scan on 2/8, small and not amenable to thoracentesis.  Started on Lasix 20 mg daily by pulmonology.  Today she notes that her shortness of breath has been improving, she has minimal crackles in base of left lung.  She will continue Lasix 20 mg daily, instructed to hold if systolic blood pressure is less than 90.   Disposition: F/u with Dr. Rennis Golden on 11/02/23 as scheduled.   Signed, Rip Harbour, NP

## 2023-10-16 NOTE — ED Provider Notes (Signed)
[place] EMERGENCY DEPARTMENT AT [facility] Provider Note  CSN: [id number] Arrival date & time: 10/15/23 1958  Chief Complaint(s) Abdominal Pain and Palpitations  HPI Kristina Osborne is a 73 y.o. female history of essential thrombocytosis, gastritis, GERD, mitral valve prolapse, presenting to the emergency department with epigastric pain.  Patient reports epigastric pain, sensation of gas pain, started today.  She reports associated nausea, vomiting.  No hematemesis, hematochezia.  No rectal bleeding.  No chest pain, shortness of breath.  She notes that she developed palpitations, which is why she called paramedics.  Paramedics found the patient had rapid heartbeat, potentially atrial fibrillation, patient denies any history of this.  She did receive IV fluids and antinausea medication with the paramedics and reports overall feels better.  She reports that epigastric pain she is experiencing feels similar to her chronic gastritis but is typically more well-controlled.   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   Thrombocytosis    Patient Active Problem List   Diagnosis Date Noted   Posterior neck pain 09/08/2023   Coccyx pain 09/08/2023   LLQ pain 09/08/2023   Acute non-recurrent maxillary sinusitis 08/09/2023   Sinus congestion 08/09/2023   Sinus headache 08/09/2023   Macrocytic anemia 06/25/2022   GI bleed 06/24/2022   Iron deficiency anemia    Heme positive stool    Jejunal polyp    Benign neoplasm of colon    ABLA (acute blood loss anemia) 04/16/2022   Symptomatic anemia 04/16/2022   GI bleeding 04/15/2022   Right elbow pain 11/06/2021   Palpitations  11/06/2021   Cervical radiculitis 11/02/2021   Need for vaccination 09/10/2021   AVM (arteriovenous malformation) of small bowel, 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 Authorizing Provider  ondansetron (ZOFRAN-ODT) 4 MG disintegrating tablet Take 1 tablet (4 mg total) by mouth every 8 (eight) hours as needed for nausea or vomiting. 10/16/23  Yes Lonell Grandchild, MD  acetaminophen (TYLENOL) 500 MG tablet Take 1,000 mg by mouth every 6 (six) hours as needed for mild pain or headache.    [provider]  Ascorbic Acid (VITAMIN C) 1000 MG tablet Take 500 mg by mouth in the morning and at bedtime.    [provider]  azithromycin (ZITHROMAX) 250 MG tablet Take first 2 tablets together today, and then take 1 tablet every day until finished. 10/06/23   Curatolo, Adam, DO  chlorhexidine (PERIDEX) 0.12 % solution 5 mLs by Mouth Rinse route as needed. 05/29/22   [provider]  cholecalciferol (VITAMIN D3) 25 MCG (1000 UNIT) tablet Take 1,000 Units by mouth daily.    [provider]  cholestyramine light (PREVALITE) 4 g packet Take 1 packet (4 g total) by mouth 2 (two) times daily. 07/17/22   Corwin Levins, MD  cyclobenzaprine (FLEXERIL) 5 MG tablet Take 1 tablet (5 mg total)  by mouth 3 (three) times daily as needed for muscle spasms. 09/12/23   Corwin Levins, MD  dicyclomine (BENTYL) 20 MG tablet Take 1 tablet (20 mg total) by mouth 3 (three) times daily as needed for spasms. 10/16/23   Corwin Levins, MD  famotidine (PEPCID) 40 MG tablet TAKE 1 TABLET(40 MG) BY MOUTH DAILY 08/23/23   Jaci Standard, MD   folic acid (FOLVITE) 1 MG tablet Take 1 tablet (1 mg total) by mouth daily. 05/22/23   Jaci Standard, MD  furosemide (LASIX) 20 MG tablet Take 1 tablet (20 mg total) by mouth daily. 10/08/23 10/07/24  Salena Saner, MD  glucosamine-chondroitin 500-400 MG tablet Take 1 tablet by mouth 2 (two) times daily.    [provider]  hydroxyurea (HYDREA) 500 MG capsule Take 1 capsule (500 mg total) by mouth 2 (two) times daily. 05/22/23   Jaci Standard, MD  lidocaine (LIDODERM) 5 % Place 1 patch onto the skin daily. Remove & Discard patch within 12 hours or as directed by MD 08/31/23   Elayne Snare K, DO  metoprolol tartrate (LOPRESSOR) 25 MG tablet Take 1/2 tablet ( 12.5 mg ) twice a day 10/12/23   [name], MD  Multiple Vitamins-Minerals (MULTIVITAMIN ADULTS 50+) TABS Take 1 tablet by mouth daily.    [provider]  Multiple Vitamins-Minerals (PRESERVISION AREDS 2+MULTI VIT PO) Take 1 capsule by mouth 2 (two) times daily.    [provider]  sucralfate (CARAFATE) 1 g tablet TAKE 1 TABLET(1 GRAM) BY MOUTH FOUR TIMES DAILY AT BEDTIME WITH MEALS 08/31/23   Jaci Standard, MD  tretinoin (RETIN-A) 0.05 % cream Apply topically at bedtime. Apply a pea sized amount mixed with a quarter sized amount to the affected area three nights a week 12/25/22 12/25/23  Terri Piedra, DO                                                                                                                                    Past Surgical History Past Surgical History:  Procedure Laterality Date   BIOPSY  06/26/2022   Procedure: BIOPSY;  Surgeon: Jenel Lucks, MD;  Location: [facility];  Service: Gastroenterology;;   COLONOSCOPY N/A 04/17/2022   Procedure: COLONOSCOPY;  Surgeon: Beverley Fiedler, MD;  Location: [facility];  Service: Gastroenterology;  Laterality: N/A;   COLONOSCOPY WITH PROPOFOL N/A 06/26/2022   Procedure: COLONOSCOPY WITH PROPOFOL;  Surgeon: Jenel Lucks, MD;  Location: [facility];  Service: Gastroenterology;  Laterality: N/A;   ENTEROSCOPY N/A 04/17/2022   Procedure: ENTEROSCOPY;  Surgeon: Beverley Fiedler, MD;  Location: [facility];  Service: Gastroenterology;  Laterality: N/A;   ENTEROSCOPY N/A 06/26/2022   Procedure: ENTEROSCOPY;  Surgeon: Jenel Lucks, MD;  Location: [facility];  Service: Gastroenterology;  Laterality: N/A;   GIVENS CAPSULE STUDY N/A 06/26/2022   Procedure: GIVENS  CAPSULE STUDY;  Surgeon: Jenel Lucks, MD;  Location: [facility];  Service: Gastroenterology;  Laterality: N/A;   HEMOSTASIS CLIP PLACEMENT  04/17/2022   Procedure: HEMOSTASIS CLIP PLACEMENT;  Surgeon: Beverley Fiedler, MD;  Location: [facility];  Service: Gastroenterology;;   HEMOSTASIS CLIP PLACEMENT  06/26/2022   Procedure: HEMOSTASIS CLIP PLACEMENT;  Surgeon: Jenel Lucks, MD;  Location: [facility];  Service: Gastroenterology;;   HOT HEMOSTASIS N/A 04/17/2022   Procedure: HOT HEMOSTASIS (ARGON PLASMA COAGULATION/BICAP);  Surgeon: Beverley Fiedler, MD;  Location: [facility];  Service: Gastroenterology;  Laterality: N/A;   HOT HEMOSTASIS N/A 06/26/2022   Procedure: HOT HEMOSTASIS (ARGON PLASMA COAGULATION/BICAP);  Surgeon: Jenel Lucks, MD;  Location: [facility];  Service: Gastroenterology;  Laterality: N/A;   POLYPECTOMY  04/17/2022   Procedure: POLYPECTOMY;  Surgeon: Beverley Fiedler, MD;  Location: [facility];  Service: Gastroenterology;;   POLYPECTOMY  06/26/2022   Procedure: POLYPECTOMY;  Surgeon: Jenel Lucks, MD;  Location: [facility];  Service: Gastroenterology;;   SMALL INTESTINE SURGERY  1994   SUBMUCOSAL TATTOO INJECTION  06/26/2022   Procedure: SUBMUCOSAL TATTOO INJECTION;  Surgeon: Jenel Lucks, MD;  Location: [facility];  Service: Gastroenterology;;   THYMECTOMY N/A    Family History Family History  Problem Relation Age of Onset   Heart disease Mother    Valvular heart disease  Mother    Healthy Sister    Alcoholism Half-Sister     Social History Social History   Tobacco Use   Smoking status: Former    Current packs/day: 0.00    Types: Cigarettes    Quit date: 2002    Years since quitting: 23.1   Smokeless tobacco: Never   Tobacco comments:    Started smoking in her early 20's.    Smoked 0.5PP at her heaviest.   Vaping Use   Vaping status: Never Used  Substance Use Topics   Alcohol use: Not Currently   Drug use: Never   Allergies Aspirin and Sulfa antibiotics  Review of Systems Review of Systems  All other systems reviewed and are negative.   Physical Exam Vital Signs  I have reviewed the triage vital signs BP (!) 129/45   Pulse 86   Temp 97.9 F (36.6 C) (Oral)   Resp 18   Ht 5\' 6"  (1.676 m)   Wt 77.1 kg   LMP  (LMP Unknown)   SpO2 96%   BMI 27.44 kg/m  Physical Exam Vitals and nursing note reviewed.  Constitutional:      General: She is not in acute distress.    Appearance: She is well-developed.  HENT:     Head: Normocephalic and atraumatic.     Mouth/Throat:     Mouth: Mucous membranes are moist.  Eyes:     Pupils: Pupils are equal, round, and reactive to light.  Cardiovascular:     Rate and Rhythm: Normal rate and regular rhythm.     Heart sounds: No murmur heard. Pulmonary:     Effort: Pulmonary effort is normal. No respiratory distress.     Breath sounds: Normal breath sounds.  Abdominal:     General: Abdomen is flat.     Palpations: Abdomen is soft.     Tenderness: There is abdominal tenderness in the epigastric area and left upper quadrant.  Musculoskeletal:        General: No tenderness.     Right lower leg: No edema.     Left lower leg: No edema.  Skin:    General: Skin is warm and dry.  Neurological:     General: No focal deficit present.     Mental Status: She is alert. Mental status is at baseline.  Psychiatric:        Mood and Affect: Mood normal.        Behavior: Behavior normal.     ED  Results and Treatments Labs (all labs ordered are listed, but only abnormal results are displayed) Labs Reviewed  COMPREHENSIVE METABOLIC PANEL - Abnormal; Notable for the following components:      Result Value   Potassium 3.0 (*)    CO2 20 (*)    Glucose, Bld 106 (*)    Total Protein 6.4 (*)    All other components within normal limits  CBC WITH DIFFERENTIAL/PLATELET - Abnormal; Notable for the following components:   WBC 3.5 (*)    RBC 2.97 (*)    HCT 35.8 (*)    MCV 120.5 (*)    MCH 41.1 (*)    Platelets 663 (*)    Lymphs Abs 0.2 (*)    All other components within normal limits  MAGNESIUM  LIPASE, BLOOD                                                                                                                          Radiology CT ABDOMEN PELVIS W CONTRAST Result Date: 10/15/2023 CLINICAL DATA:  Abdominal pain EXAM: CT ABDOMEN AND PELVIS WITH CONTRAST TECHNIQUE: Multidetector CT imaging of the abdomen and pelvis was performed using the standard protocol following bolus administration of intravenous contrast. RADIATION DOSE REDUCTION: This exam was performed according to the departmental dose-optimization program which includes automated exposure control, adjustment of the mA and/or kV according to patient size and/or use of iterative reconstruction technique. CONTRAST:  75mL OMNIPAQUE IOHEXOL 350 MG/ML SOLN COMPARISON:  None Available. FINDINGS: Lower Chest: Normal. Hepatobiliary: Normal hepatic contours. No intra- or extrahepatic biliary dilatation. The gallbladder is normal. Pancreas: Normal pancreas. No ductal dilatation or peripancreatic fluid collection. Spleen: Normal. Adrenals/Urinary Tract: The adrenal glands are normal. No hydronephrosis, nephroureterolithiasis or solid renal mass. The urinary bladder is normal for degree of distention Stomach/Bowel: There is no hiatal hernia. Normal duodenal course and caliber. No small bowel dilatation or inflammation. There is mild wall  thickening of the distal rectum without inflammatory change. There are anastomoses in the right and left lower quadrants. Vascular/Lymphatic: There is calcific atherosclerosis of the abdominal aorta. No lymphadenopathy. Reproductive: Status post hysterectomy. No adnexal mass. Other: None. Musculoskeletal: No bony spinal canal stenosis or focal osseous abnormality. IMPRESSION: 1. No acute abnormality of the abdomen or pelvis. Electronically Signed   By: Deatra Robinson M.D.   On: 10/15/2023 22:11   DG Chest Portable 1 View Result Date: 10/15/2023 CLINICAL DATA:  Shortness of breath.  Palpitation. EXAM: PORTABLE CHEST 1 VIEW COMPARISON:  Chest radiograph dated 10/06/2023. FINDINGS: No focal consolidation, pleural effusion, pneumothorax. Top-normal cardiac size. Calcification of the mitral  annulus. Median sternotomy wires. No acute osseous pathology. IMPRESSION: No active disease. Electronically Signed   By: Elgie Collard M.D.   On: 10/15/2023 21:15    Pertinent labs & imaging results that were available during my care of the patient were reviewed by me and considered in my medical decision making (see MDM for details).  Medications Ordered in ED Medications  sodium chloride 0.9 % bolus 1,000 mL (0 mLs Intravenous Stopped 10/15/23 2139)  iohexol (OMNIPAQUE) 350 MG/ML injection 75 mL (75 mLs Intravenous Contrast Given 10/15/23 2146)  acetaminophen (TYLENOL) tablet 1,000 mg (1,000 mg Oral Given 10/15/23 2354)  alum & mag hydroxide-simeth (MAALOX/MYLANTA) 200-200-20 MG/5ML suspension 30 mL (30 mLs Oral Given 10/15/23 2355)    And  lidocaine (XYLOCAINE) 2 % viscous mouth solution 15 mL (15 mLs Oral Given 10/15/23 2355)  potassium chloride SA (KLOR-CON M) CR tablet 40 mEq (40 mEq Oral Given 10/16/23 0018)  ondansetron (ZOFRAN) injection 4 mg (4 mg Intravenous Given 10/16/23 0046)  dicyclomine (BENTYL) capsule 10 mg (10 mg Oral Given 10/16/23 0046)                                                                                                                                      Procedures Procedures  (including critical care time)  Medical Decision Making / ED Course   MDM:  73 year old presenting to the emergency department with nausea, palpitations.  On exam, patient did have some gastric and left upper quadrant tenderness.  She reports that symptoms feel similar to prior episode of gastritis.  Given tenderness and age, CT scan was obtained, which is reassuring without evidence of acute process such as obstruction, perforation, volvulus, abscess, diverticulitis.  Given upper abdominal pain also obtained x-ray of the chest which is negative for any acute process.  Suspect symptoms are related to her chronic gastritis.  Recommended follow-up with her GI physician.  She request prescription for Bentyl which she reports she has taken previously and this has helped.  She reports that she also had a sensation of some gas pain and this improved after she had a bowel movement.  No concern for other process such as dissection, ACS, pulmonary embolism, or other unusual cause of epigastric pain.  Patient able to tolerate p.o. in the emergency department.  Patient is also concerned about palpitations, reviewed paramedic EKG which does show an irregular tachycardia, rate of 130.  Difficult to see whether the patient has any P waves as there is significant artifact in the baseline.  Differential includes atrial fibrillation versus sinus tachycardia with PAC.  ECG here taken after patient had converted to a normal rhythm.  She has follow-up with cardiology next week.  Given no clear EKG with A-fib and now patient is definitely back in a normal sinus rhythm would defer further discussions with anticoagulation to cardiology.  Patient also reports that she cannot take anticoagulation given history of a  GI bleed.  Discussed that if she has recurrent palpitation she should return to the emergency department.  Will discharge  patient to home. All questions answered. Patient comfortable with plan of discharge. Return precautions discussed with patient and specified on the after visit summary.   Clinical Course as of 10/16/23 1632  Mon Oct 15, 2023  2347 Magnesium: 1.7 [WS]    Clinical Course User Index [WS] Lonell Grandchild, MD     Additional history obtained: -Additional history obtained from ems and friend -External records from outside source obtained and reviewed including: Chart review including previous notes, labs, imaging, consultation notes including prior notes    Lab Tests: -I ordered, reviewed, and interpreted labs.   The pertinent results include:   Labs Reviewed  COMPREHENSIVE METABOLIC PANEL - Abnormal; Notable for the following components:      Result Value   Potassium 3.0 (*)    CO2 20 (*)    Glucose, Bld 106 (*)    Total Protein 6.4 (*)    All other components within normal limits  CBC WITH DIFFERENTIAL/PLATELET - Abnormal; Notable for the following components:   WBC 3.5 (*)    RBC 2.97 (*)    HCT 35.8 (*)    MCV 120.5 (*)    MCH 41.1 (*)    Platelets 663 (*)    Lymphs Abs 0.2 (*)    All other components within normal limits  MAGNESIUM  LIPASE, BLOOD    Notable for mild hypolkalemia   EKG   EKG Interpretation Date/Time:  Monday October 15 2023 20:25:10 EST Ventricular Rate:  96 PR Interval:  136 QRS Duration:  96 QT Interval:  342 QTC Calculation: 433 R Axis:   68  Text Interpretation: Sinus rhythm Probable left atrial enlargement RSR' in V1 or V2, probably normal variant Borderline repolarization abnormality Confirmed by Alvino Blood ([id number]) on 10/15/2023 11:57:19 PM Also confirmed by Alvino Blood ([id number]), editor Stetler, Angela ([id number])  on 10/16/2023 7:01:04 AM         Imaging Studies ordered: I ordered imaging studies including CT abd, CXR On my interpretation imaging demonstrates no acute process I independently visualized and interpreted  imaging. I agree with the radiologist interpretation   Medicines ordered and prescription drug management: Meds ordered this encounter  Medications   sodium chloride 0.9 % bolus 1,000 mL   iohexol (OMNIPAQUE) 350 MG/ML injection 75 mL   acetaminophen (TYLENOL) tablet 1,000 mg   AND Linked Order Group    alum & mag hydroxide-simeth (MAALOX/MYLANTA) 200-200-20 MG/5ML suspension 30 mL    lidocaine (XYLOCAINE) 2 % viscous mouth solution 15 mL   potassium chloride SA (KLOR-CON M) CR tablet 40 mEq   DISCONTD: dicyclomine (BENTYL) 20 MG tablet    Sig: Take 1 tablet (20 mg total) by mouth 2 (two) times daily as needed for spasms.    Dispense:  20 tablet    Refill:  0   ondansetron (ZOFRAN-ODT) 4 MG disintegrating tablet    Sig: Take 1 tablet (4 mg total) by mouth every 8 (eight) hours as needed for nausea or vomiting.    Dispense:  12 tablet    Refill:  0   ondansetron (ZOFRAN) injection 4 mg   dicyclomine (BENTYL) capsule 10 mg    -I have reviewed the patients home medicines and have made adjustments as needed   Cardiac Monitoring: The patient was maintained on a cardiac monitor.  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    Final Clinical Impression(s) / ED  Diagnoses Final diagnoses:  Acute gastritis without hemorrhage, unspecified gastritis type  Palpitations     This chart was dictated using voice recognition software.  Despite best efforts to proofread,  errors can occur which can change the documentation meaning.    Lonell Grandchild, MD 10/16/23 [id number]

## 2023-10-16 NOTE — Telephone Encounter (Signed)
 Ok done erx

## 2023-10-16 NOTE — H&P (View-Only) (Signed)
 Cardiology Office Note    Date:  10/17/2023  ID:  Kristina Osborne, DOB [birth year], MRN [id number] PCP:  Corwin Levins, MD  Cardiologist:  [name], MD  Electrophysiologist:  None   Chief Complaint: Follow up for mitral valve prolapse, palpitations   History of Present Illness: .    Kristina Osborne is a 73 y.o. female with visit-pertinent history of mitral valve prolapse, palpitations, PVCs.  Patient first evaluated by Dr. Rennis Golden on 10/07/2021, per notes patient had a remote history of an echocardiogram as well as stress testing greater than 7 years ago that was reassuring.  She was noted to have some mild regurgitation and prolapse of the mitral valve.  It was noted that there is a family history of heart disease in her mother who had a CABG and died of aortic aneurysm.  Given very frequent PVCs she is previous switch from short acting propranolol to metoprolol tartrate 12.5 mg twice daily.  She was then lost to follow-up.  On 10/11/2023 she underwent echocardiogram that indicated LVEF of 60 to 65%, LV with normal function, no RWMA, diastolic parameters were indeterminate.  RV systolic function and size was normal.  The left atrial size was severely dilated.  The mitral valve was markedly abnormal, there was moderate calcification and deformity of the posterior leaflet with at least moderate prolapse.  There was severe posterior directed MR filling the entire LA, the mitral valve is abnormal, severe mitral valve regurgitation, no evidence of mitral stenosis, there is prolapse of multiple scallops of the posterior leaflet of the mitral valve.  TEE was recommended for further evaluation.  On chart review patient notified the office of increased arrhythmias, she was restarted on metoprolol tartrate 12.5 mg twice daily.  On 08/14/2024 patient called paramedics for palpitations.  She is found to have rapid heartbeat, she received IV fluids and antinausea medication with paramedics and reported  overall feeling better.  She noted epigastric pain that felt similar to her chronic gastritis.  It was noted that it was difficult to discern if patient was having episode of atrial fibrillation versus sinus tachycardia with PACs, ECG in the ER indicated normal rhythm.  Today she presents for follow-up.  She reports that she is doing ok.  She notes that she has had increased stress at home.  She notes that in January she and her husband just developed the flu, following her husband reportedly had a stroke.  She has now been assisting in caring for him.  She notes that recently she has been increasingly fatigued and short of breath, improved with Lasix use.  She denies chest pain, lower extremity edema, orthopnea or PND.  She notes that in the last week she started having diarrhea and GI upset, she plans to follow-up with her PCP if symptoms are ongoing.  She notes that her blood pressure has been running slightly lower than her normal, she is notes typically her systolic blood pressure will run between 100-1 10, has been consistently in the 90s.  She denies dizziness, lightheadedness, presyncope or syncope.  She notes that she did not start on metoprolol tartrate.  Labwork independently reviewed: 10/15/2023: Sodium 140, potassium 3.0, creatinine 0.73, AST 30, ALT 25, WBC 3.5, RBC 2.97, hemoglobin 12.2, hematocrit 35.8  ROS: .   Today she denies chest pain, lower extremity edema, melena, hematuria, hemoptysis, diaphoresis, weakness, presyncope, syncope, orthopnea, and PND.  All other systems are reviewed and otherwise negative. Studies Reviewed: Marland Kitchen    EKG:  EKG is ordered today, personally reviewed, demonstrating  EKG Interpretation Date/Time:  Wednesday October 17 2023 11:15:20 EST Ventricular Rate:  76 PR Interval:  142 QRS Duration:  88 QT Interval:  410 QTC Calculation: 461 R Axis:   30  Text Interpretation: Normal sinus rhythm Possible Left atrial enlargement Confirmed by Reather Littler [phone])  on 10/17/2023 4:03:27 PM   CV Studies:  Cardiac Studies & Procedures   ______________________________________________________________________________________________     ECHOCARDIOGRAM  ECHOCARDIOGRAM COMPLETE 10/11/2023  Narrative ECHOCARDIOGRAM REPORT    Patient Name:   Kristina Osborne Date of Exam: 10/11/2023 Medical Rec #:  [id number]         Height:       66.0 in Accession #:    [id number]        Weight:       170.0 lb Date of Birth:  1951-01-25         BSA:          1.866 m Patient Age:    72 years          BP:           122/70 mmHg Patient Gender: F                 HR:           87 bpm. Exam Location:  [address]  Procedure: 2D Echo, 3D Echo and Strain Analysis (Both Spectral and Color Flow Doppler were utilized during procedure).  Indications:    I05.9 Mitral valve disorder  History:        Patient has no prior history of Echocardiogram examinations. Mitral Valve Prolapse, Arrythmias:PVC; Signs/Symptoms:Shortness of Breath. Tachycardia. Palpitations. Pleural effusions.  Sonographer:    Cathie Beams RCS Referring Phys: [id number] CARMEN L GONZALEZ  IMPRESSIONS   1. Left ventricular ejection fraction, by estimation, is 60 to 65%. Left ventricular ejection fraction by 3D volume is 62 %. The left ventricle has normal function. The left ventricle has no regional wall motion abnormalities. Left ventricular diastolic parameters are indeterminate. The average left ventricular global longitudinal strain is -25.9 %. The global longitudinal strain is normal. 2. Right ventricular systolic function is normal. The right ventricular size is normal. 3. Left atrial size was severely dilated. 4. The MV is markedly abnormal. There is moderate calcification and deformity of the posterior leaflet with at least moderate prolapse. There is very severe posteriorly directed MR filling the entire LA. The mitral valve is abnormal. Severe mitral valve regurgitation. No evidence of mitral  stenosis. There is prolapse of multiple scallops of the posterior leaflet of the mitral valve. 5. The aortic valve is normal in structure. Aortic valve regurgitation is not visualized. Aortic valve sclerosis/calcification is present, without any evidence of aortic stenosis. 6. The inferior vena cava is normal in size with greater than 50% respiratory variability, suggesting right atrial pressure of 3 mmHg.  Conclusion(s)/Recommendation(s): Suggest TEE to further evalaute the mitral valve.  FINDINGS Left Ventricle: Left ventricular ejection fraction, by estimation, is 60 to 65%. Left ventricular ejection fraction by 3D volume is 62 %. The left ventricle has normal function. The left ventricle has no regional wall motion abnormalities. The average left ventricular global longitudinal strain is -25.9 %. Strain was performed and the global longitudinal strain is normal. The left ventricular internal cavity size was normal in size. There is no left ventricular hypertrophy. Left ventricular diastolic parameters are indeterminate.  Right Ventricle: The right ventricular size is normal. No increase in right ventricular  wall thickness. Right ventricular systolic function is normal.  Left Atrium: Left atrial size was severely dilated.  Right Atrium: Right atrial size was normal in size.  Pericardium: There is no evidence of pericardial effusion.  Mitral Valve: The MV is markedly abnormal. There is moderate calcification and deformity of the posterior leaflet with at least moderate prolapse. There is very severe posteriorly directed MR filling the entire LA. The mitral valve is abnormal. There is prolapse of multiple scallops of the posterior leaflet of the mitral valve. There is moderate thickening of the mitral valve leaflet(s). There is moderate calcification of the mitral valve leaflet(s). Mild mitral annular calcification. Severe mitral valve regurgitation. No evidence of mitral valve  stenosis.  Tricuspid Valve: The tricuspid valve is normal in structure. Tricuspid valve regurgitation is mild . No evidence of tricuspid stenosis.  Aortic Valve: The aortic valve is normal in structure. Aortic valve regurgitation is not visualized. Aortic valve sclerosis/calcification is present, without any evidence of aortic stenosis.  Pulmonic Valve: The pulmonic valve was normal in structure. Pulmonic valve regurgitation is trivial. No evidence of pulmonic stenosis.  Aorta: The aortic root is normal in size and structure.  Venous: The inferior vena cava is normal in size with greater than 50% respiratory variability, suggesting right atrial pressure of 3 mmHg.  IAS/Shunts: No atrial level shunt detected by color flow Doppler.  Additional Comments: 3D was performed not requiring image post processing on an independent workstation and was normal.   LEFT VENTRICLE PLAX 2D LVIDd:         5.30 cm         Diastology LV PW:         1.00 cm         LV e' medial:    6.96 cm/s LV IVS:        0.80 cm         LV E/e' medial:  16.7 LVOT diam:     2.00 cm         LV e' lateral:   11.00 cm/s LV SV:         49              LV E/e' lateral: 10.5 LV SV Index:   26 LVOT Area:     3.14 cm        2D Longitudinal Strain 2D Strain GLS  -25.9 % Avg:  3D Volume EF LV 3D EF:    Left ventricul ar ejection fraction by 3D volume is 62 %.  3D Volume EF: 3D EF:        62 % LV EDV:       151 ml LV ESV:       58 ml LV SV:        93 ml  RIGHT VENTRICLE RV Basal diam:  3.10 cm RV S prime:     12.20 cm/s TAPSE (M-mode): 2.0 cm  LEFT ATRIUM             Index        RIGHT ATRIUM           Index LA diam:        4.80 cm 2.57 cm/m   RA Area:     12.10 cm LA Vol (A2C):   90.6 ml 48.54 ml/m  RA Volume:   25.10 ml  13.45 ml/m LA Vol (A4C):   79.9 ml 42.81 ml/m LA Biplane Vol: 92.0 ml 49.29 ml/m AORTIC VALVE LVOT Vmax:  97.87 cm/s LVOT Vmean:  55.833 cm/s LVOT VTI:    0.155 m  AORTA Ao  Root diam: 2.90 cm  MITRAL VALVE MV Area (PHT): 4.96 cm       SHUNTS MV Decel Time: 153 msec       Systemic VTI:  0.16 m MR Peak grad:    65.0 mmHg    Systemic Diam: 2.00 cm MR Mean grad:    39.0 mmHg MR Vmax:         403.00 cm/s MR Vmean:        297.0 cm/s MR PISA:         4.02 cm MR PISA Eff ROA: 40 mm MR PISA Radius:  0.80 cm MV E velocity: 116.00 cm/s MV A velocity: 58.00 cm/s MV E/A ratio:  2.00  Arvilla Meres MD Electronically signed by Arvilla Meres MD Signature Date/Time: 10/11/2023/3:18:33 PM    Final          ______________________________________________________________________________________________        Current Reported Medications:.    Current Meds  Medication Sig   acetaminophen (TYLENOL) 500 MG tablet Take 1,000 mg by mouth every 6 (six) hours as needed for mild pain or headache.   Ascorbic Acid (VITAMIN C) 1000 MG tablet Take 500 mg by mouth in the morning and at bedtime.   cholecalciferol (VITAMIN D3) 25 MCG (1000 UNIT) tablet Take 1,000 Units by mouth daily.   famotidine (PEPCID) 40 MG tablet TAKE 1 TABLET(40 MG) BY MOUTH DAILY   folic acid (FOLVITE) 1 MG tablet Take 1 tablet (1 mg total) by mouth daily.   furosemide (LASIX) 20 MG tablet Take 1 tablet (20 mg total) by mouth daily.   glucosamine-chondroitin 500-400 MG tablet Take 1 tablet by mouth 2 (two) times daily.   hydroxyurea (HYDREA) 500 MG capsule Take 1 capsule (500 mg total) by mouth 2 (two) times daily.   metoprolol tartrate (LOPRESSOR) 25 MG tablet Take 1/2 tablet ( 12.5 mg ) twice a day   Multiple Vitamins-Minerals (MULTIVITAMIN ADULTS 50+) TABS Take 1 tablet by mouth daily.   Multiple Vitamins-Minerals (PRESERVISION AREDS 2+MULTI VIT PO) Take 1 capsule by mouth 2 (two) times daily.   ondansetron (ZOFRAN-ODT) 4 MG disintegrating tablet Take 1 tablet (4 mg total) by mouth every 8 (eight) hours as needed for nausea or vomiting.   sucralfate (CARAFATE) 1 g tablet TAKE 1 TABLET(1  GRAM) BY MOUTH FOUR TIMES DAILY AT BEDTIME WITH MEALS   Physical Exam:    VS:  BP 98/62   Pulse 76   Ht 5\' 6"  (1.676 m)   Wt 146 lb 6.4 oz (66.4 kg)   LMP  (LMP Unknown)   SpO2 98%   BMI 23.63 kg/m    Wt Readings from Last 3 Encounters:  10/17/23 146 lb 6.4 oz (66.4 kg)  10/15/23 170 lb (77.1 kg)  10/08/23 170 lb (77.1 kg)   GEN: Well nourished, well developed in no acute distress NECK: No JVD; No carotid bruits CARDIAC: RRR, 3-4/6 systolic murmur, no rubs or gallops RESPIRATORY:  Overall clear to auscultation, minimal crackles in left lower base without rales, wheezing or rhonchi  ABDOMEN: Soft, non-tender, non-distended EXTREMITIES:  No edema; No acute deformity  Asessement and Plan:Marland Kitchen    Mitral valve prolapse/severe MR: On 10/11/2023 she underwent echocardiogram that indicated LVEF of 60 to 65%, LV with normal function, no RWMA, diastolic parameters were indeterminate. The mitral valve was markedly abnormal, there was moderate calcification and deformity of the posterior leaflet  with at least moderate prolapse.  There was severe posterior directed MR filling the entire LA, the mitral valve is abnormal, severe mitral valve regurgitation, no evidence of mitral stenosis, there is prolapse of multiple scallops of the posterior leaflet of the mitral valve.  TEE was recommended for further evaluation.  Today she notes that she has been having increased fatigue and some shortness of breath, she is currently on Lasix for bilateral pleural effusions, notes that this has been slowly improving her breathing.  She denies lower extremity edema, orthopnea or PND. Patient scheduled to undergo TEE on 2/26, discussed indication and likely referral to CT surgery, patient expressed her understanding and agrees to proceed, please see consent below.  Informed Consent   Shared Decision Making/Informed Consent   The risks [esophageal damage, perforation (1:10,000 risk), bleeding, pharyngeal hematoma as well as  other potential complications associated with conscious sedation including aspiration, arrhythmia, respiratory failure and death], benefits (treatment guidance and diagnostic support) and alternatives of a transesophageal echocardiogram were discussed in detail with Kristina Osborne and she is willing to proceed.       Palpitations: Patient with history of frequent PVCs.  Recently presented to the ED with increased palpitations in setting of acute gastritis. It was noted that it was difficult to discern if patient was having episode of atrial fibrillation versus sinus tachycardia with PACs, ECG in the ER indicated normal rhythm.  Patient's potassium level was also 3.0.  Patient notes that she does have occasional PVCs, she denies any further sustained palpitations.  She notes that she did not start on metoprolol tartrate, given lower blood pressures she deferred at this time.  Given recent episodes of increased palpitations will have patient wear 2-week cardiac monitor following her TEE to evaluate for arrhythmia.  Check CBC, BMET and TSH today.  Reviewed ED precautions.  Hypokalemia: Potassium 3.0 on 10/15/23.  Treated with potassium chloride 40 meq while in the ED. Check BMET today.   Pleural effusions: Patient noted to have bilateral small pleural effusions identified on CT scan on 2/8, small and not amenable to thoracentesis.  Started on Lasix 20 mg daily by pulmonology.  Today she notes that her shortness of breath has been improving, she has minimal crackles in base of left lung.  She will continue Lasix 20 mg daily, instructed to hold if systolic blood pressure is less than 90.   Disposition: F/u with Dr. Rennis Golden on 11/02/23 as scheduled.   Signed, Kristina Harbour, NP

## 2023-10-16 NOTE — Discharge Instructions (Addendum)
We evaluated you for your abdominal pain.  Your symptoms seem most consistent with gastritis.  Your symptoms improved in the emergency department with treatment.  Your ECG with the paramedics does show a fast heart rate which may be atrial fibrillation.  Our ECGs in the emergency department show a normal rhythm.  Please discuss this with your cardiologist.  You may need to have an outpatient cardiac monitor to see if there are any further episodes of this.   Please be sure to keep up with your fluid intake.  We have prescribed you nausea medication to help with this.  Please follow-up also with your primary doctor.  If you do develop any new or worsening symptoms such as recurrent pain, lightheadedness or dizziness, fevers or chills, diarrhea, uncontrolled vomiting, difficulty breathing, or any other new symptoms, please return to the emergency department.

## 2023-10-16 NOTE — Telephone Encounter (Signed)
Patient LVM: Had labs completed last night during ED Visit. Called EMTs - transported to ED for rapid irregular heartbeat and gastritis symptoms. Patient states that since she was not at home last evening and was at her sister's this morning following evening in ED, she missed yesterday evening and this morning's doses of Hydroxurea, but with count over 600 in ED labs, was not as concerned.  Asked to cancel lab appt today at [facility]. Appt cancelled.

## 2023-10-17 ENCOUNTER — Encounter: Payer: Self-pay | Admitting: Cardiology

## 2023-10-17 ENCOUNTER — Telehealth: Payer: Self-pay

## 2023-10-17 ENCOUNTER — Other Ambulatory Visit: Payer: Medicare Other

## 2023-10-17 ENCOUNTER — Ambulatory Visit: Payer: Medicare Other | Attending: Cardiology | Admitting: Cardiology

## 2023-10-17 ENCOUNTER — Ambulatory Visit (INDEPENDENT_AMBULATORY_CARE_PROVIDER_SITE_OTHER): Payer: Medicare Other

## 2023-10-17 ENCOUNTER — Ambulatory Visit: Payer: Self-pay | Admitting: Internal Medicine

## 2023-10-17 VITALS — BP 98/62 | HR 76 | Ht 66.0 in | Wt 146.4 lb

## 2023-10-17 DIAGNOSIS — I341 Nonrheumatic mitral (valve) prolapse: Secondary | ICD-10-CM

## 2023-10-17 DIAGNOSIS — R002 Palpitations: Secondary | ICD-10-CM

## 2023-10-17 DIAGNOSIS — I340 Nonrheumatic mitral (valve) insufficiency: Secondary | ICD-10-CM | POA: Diagnosis not present

## 2023-10-17 DIAGNOSIS — E876 Hypokalemia: Secondary | ICD-10-CM | POA: Diagnosis present

## 2023-10-17 DIAGNOSIS — I493 Ventricular premature depolarization: Secondary | ICD-10-CM | POA: Diagnosis present

## 2023-10-17 NOTE — Telephone Encounter (Signed)
Copied From CRM [id number]. Reason for Triage: having liquid stools for a couple days now    Chief Complaint: Diarrhea  Symptoms: Diarrhea, mild abdominal cramps Frequency: 7 episodes in the last 24 hours  Pertinent Negatives: Patient denies fevers, current vomiting  Disposition: [] ED /[] Urgent Care (no appt availability in office) / [x] Appointment(In office/virtual)/ []  [facility]/ [] Home Care/ [] Refused Recommended Disposition /[] [facility]/ []  Follow-up with PCP Additional Notes: Patient reports that she has been experiencing diarrhea for the last 3 days. She states with her diarrhea she also had vomiting but that resolved 2 days ago, as well as abdominal cramps. She states that she was seen in the ED for the same 2 days ago and her symptoms have all improved except for her diarrhea. Home care instructions discussed with patient and appointment made for tomorrow for evaluation.     Reason for Disposition  [1] SEVERE diarrhea (e.g., 7 or more times / day more than normal) AND [2] present > 24 hours (1 day)  Answer Assessment - Initial Assessment Questions 1. DIARRHEA SEVERITY: "How bad is the diarrhea?" "How many more stools have you had in the past 24 hours than normal?"    - NO DIARRHEA (SCALE 0)   - MILD (SCALE 1-3): Few loose or mushy BMs; increase of 1-3 stools over normal daily number of stools; mild increase in ostomy output.   -  MODERATE (SCALE 4-7): Increase of 4-6 stools daily over normal; moderate increase in ostomy output.   -  SEVERE (SCALE 8-10; OR "WORST POSSIBLE"): Increase of 7 or more stools daily over normal; moderate increase in ostomy output; incontinence.     7 times  2. ONSET: "When did the diarrhea begin?"      3 days ago 3. BM CONSISTENCY: "How loose or watery is the diarrhea?"      Watery 4. VOMITING: "Are you also vomiting?" If Yes, ask: "How many times in the past 24 hours?"      No vomiting since Monday  5. ABDOMEN PAIN: "Are you  having any abdomen pain?" If Yes, ask: "What does it feel like?" (e.g., crampy, dull, intermittent, constant)      Cramping 6. ABDOMEN PAIN SEVERITY: If present, ask: "How bad is the pain?"  (e.g., Scale 1-10; mild, moderate, or severe)   - MILD (1-3): doesn't interfere with normal activities, abdomen soft and not tender to touch    - MODERATE (4-7): interferes with normal activities or awakens from sleep, abdomen tender to touch    - SEVERE (8-10): excruciating pain, doubled over, unable to do any normal activities       Mild 7. ORAL INTAKE: If vomiting, "Have you been able to drink liquids?" "How much liquids have you had in the past 24 hours?"     Yes 8. HYDRATION: "Any signs of dehydration?" (e.g., dry mouth [not just dry lips], too weak to stand, dizziness, new weight loss) "When did you last urinate?"     Dry mouth  9. EXPOSURE: "Have you traveled to a foreign country recently?" "Have you been exposed to anyone with diarrhea?" "Could you have eaten any food that was spoiled?"     No 10. ANTIBIOTIC USE: "Are you taking antibiotics now or have you taken antibiotics in the past 2 months?"       Yes 11. OTHER SYMPTOMS: "Do you have any other symptoms?" (e.g., fever, blood in stool)       No 12. PREGNANCY: "Is there any chance  you are pregnant?" "When was your last menstrual period?"       No  Protocols used: [facility]

## 2023-10-17 NOTE — Progress Notes (Unsigned)
Enrolled patient for a 14 day ZIo XT monitor to be mailed to patients home  Hilty to read

## 2023-10-17 NOTE — Patient Instructions (Signed)
Medication Instructions:  Hold Lasix for a systolic blood pressure less than 90 *If you need a refill on your cardiac medications before your next appointment, please call your pharmacy*  Lab Work: At your convenience we will need to draw a CBC, Bmet, and TSH If you have labs (blood work) drawn today and your tests are completely normal, you will receive your results only by: MyChart Message (if you have MyChart) OR A paper copy in the mail If you have any lab test that is abnormal or we need to change your treatment, we will call you to review the results.  Testing/Procedures: No testing  Follow-Up: Place after TEE ZIO XT- Long Term Monitor Instructions  Your physician has requested you wear a ZIO patch monitor for 14 days.  This is a single patch monitor. [employer] supplies one patch monitor per enrollment. Additional stickers are not available. Please do not apply patch if you will be having a Nuclear Stress Test,  Echocardiogram, Cardiac CT, MRI, or Chest Xray during the period you would be wearing the  monitor. The patch cannot be worn during these tests. You cannot remove and re-apply the  ZIO XT patch monitor.  Your ZIO patch monitor will be mailed 3 day USPS to your address on file. It may take 3-5 days  to receive your monitor after you have been enrolled.  Once you have received your monitor, please review the enclosed instructions. Your monitor  has already been registered assigning a specific monitor serial # to you.  Billing and Patient Assistance Program Information  We have supplied [employer] with any of your insurance information on file for billing purposes. [employer] offers a sliding scale Patient Assistance Program for patients that do not have  insurance, or whose insurance does not completely cover the cost of the ZIO monitor.  You must apply for the Patient Assistance Program to qualify for this discounted rate.  To apply, please call [employer] at [phone], select  option 4, select option 2, ask to apply for  Patient Assistance Program. Meredeth Ide will ask your household income, and how many people  are in your household. They will quote your out-of-pocket cost based on that information.  [employer] will also be able to set up a 58-month, interest-free payment plan if needed.  Applying the monitor   Shave hair from upper left chest.  Hold abrader disc by orange tab. Rub abrader in 40 strokes over the upper left chest as  indicated in your monitor instructions.  Clean area with 4 enclosed alcohol pads. Let dry.  Apply patch as indicated in monitor instructions. Patch will be placed under collarbone on left  side of chest with arrow pointing upward.  Rub patch adhesive wings for 2 minutes. Remove white label marked "1". Remove the white  label marked "2". Rub patch adhesive wings for 2 additional minutes.  While looking in a mirror, press and release button in center of patch. A small green light will  flash 3-4 times. This will be your only indicator that the monitor has been turned on.  Do not shower for the first 24 hours. You may shower after the first 24 hours.  Press the button if you feel a symptom. You will hear a small click. Record Date, Time and  Symptom in the Patient Logbook.  When you are ready to remove the patch, follow instructions on the last 2 pages of Patient  Logbook. Stick patch monitor onto the last page of Patient Logbook.  Place  Patient Logbook in the blue and white box. Use locking tab on box and tape box closed  securely. The blue and white box has prepaid postage on it. Please place it in the mailbox as  soon as possible. Your physician should have your test results approximately 7 days after the  monitor has been mailed back to [facility].  Call [facility] Customer Care at [phone] if you have questions regarding  your ZIO XT patch monitor. Call them immediately if you see an orange light blinking on your  monitor.   If your monitor falls off in less than 4 days, contact our Monitor department at [phone].  If your monitor becomes loose or falls off after 4 days call [employer] at [phone] for  suggestions on securing your monitor   [facility], you and your health needs are our priority.  As part of our continuing mission to provide you with exceptional heart care, we have created designated Provider Care Teams.  These Care Teams include your primary Cardiologist (physician) and Advanced Practice Providers (APPs -  Physician Assistants and Nurse Practitioners) who all work together to provide you with the care you need, when you need it.  We recommend signing up for the patient portal called "MyChart".  Sign up information is provided on this After Visit Summary.  MyChart is used to connect with patients for Virtual Visits (Telemedicine).  Patients are able to view lab/test results, encounter notes, upcoming appointments, etc.  Non-urgent messages can be sent to your provider as well.   To learn more about what you can do with MyChart, go to [email or web address].    Your next appointment:   As scheduled

## 2023-10-18 ENCOUNTER — Encounter: Payer: Self-pay | Admitting: Internal Medicine

## 2023-10-18 ENCOUNTER — Ambulatory Visit (INDEPENDENT_AMBULATORY_CARE_PROVIDER_SITE_OTHER): Payer: Medicare Other | Admitting: Internal Medicine

## 2023-10-18 VITALS — BP 122/64 | HR 78 | Temp 98.1°F | Ht 66.0 in

## 2023-10-18 DIAGNOSIS — E876 Hypokalemia: Secondary | ICD-10-CM

## 2023-10-18 DIAGNOSIS — R197 Diarrhea, unspecified: Secondary | ICD-10-CM

## 2023-10-18 DIAGNOSIS — I340 Nonrheumatic mitral (valve) insufficiency: Secondary | ICD-10-CM

## 2023-10-18 DIAGNOSIS — Z1159 Encounter for screening for other viral diseases: Secondary | ICD-10-CM

## 2023-10-18 DIAGNOSIS — D500 Iron deficiency anemia secondary to blood loss (chronic): Secondary | ICD-10-CM | POA: Diagnosis not present

## 2023-10-18 DIAGNOSIS — K219 Gastro-esophageal reflux disease without esophagitis: Secondary | ICD-10-CM | POA: Diagnosis not present

## 2023-10-18 NOTE — Assessment & Plan Note (Signed)
Lab Results  Component Value Date   WBC 3.5 (L) 10/15/2023   HGB 12.2 10/15/2023   HCT 35.8 (L) 10/15/2023   MCV 120.5 (H) 10/15/2023   PLT 663 (H) 10/15/2023    Stable, cont current med tx

## 2023-10-18 NOTE — Patient Instructions (Addendum)
Please continue all other medications as before, including the lasix for now  Please have the pharmacy call with any other refills you may need.  Please continue your efforts at being more active, low cholesterol diet, and weight control.  You are otherwise up to date with prevention measures today.  Please keep your appointments with your specialists as you may have planned - lab testing per cardiology soon as you have planned which should include the potassium lab follow up  Please go to the LAB at the blood drawing area for the tests to be done - just the stool testing today  You will be contacted by phone if any changes need to be made immediately.  Otherwise, you will receive a letter about your results with an explanation, but please check with MyChart first.

## 2023-10-18 NOTE — Assessment & Plan Note (Signed)
Mild, s/p oral replacement, with ongoing diarrhea and lasix since then, pt declines f/u lab today but has lab per cardiology soon

## 2023-10-18 NOTE — Assessment & Plan Note (Signed)
 Stable overall,  to f/u any worsening symptoms or concerns

## 2023-10-18 NOTE — Assessment & Plan Note (Signed)
Likely recent mild lower volume but appears improved with less GI symptoms, pt to continue lasix for now, f/u TEE as planned

## 2023-10-18 NOTE — Assessment & Plan Note (Signed)
With mild abd pain , no fever or blood but had recent antiobiotic x 2 - for GI profile r/o c diff or other, for probiotic asd,  to f/u any worsening symptoms or concerns

## 2023-10-18 NOTE — Progress Notes (Signed)
Patient ID: Kristina Osborne, female   DOB: [birth year], 73 y.o.   MRN: [id number]        Chief Complaint: follow up recent serial illness including influenza A episode, mild  dehydration, low K, and watery diarrhea for last several days in the setting of new finding severe MR and stress associated with husband recent illness as well       HPI:  Kristina Osborne is a 72 y.o. female here after spent 4 night in hosp with husband 1/26 for several days for flu A/sepsis and TIA, pt became pos about jan 31, tx with tamilfu and amoxil and then feb 8 with tachycardia sob with bilat pulm effusoin, tx with zpack and lasix 20 mg , then referred for echo found to have severe MR maybe needs MVR, and also referred pulm with lasix refill, , then seemed improved, then sun feb 18 with epigsatric pain, tx with dicycomine and zofran, but next day with liquid watery diarrhea still no fever but n/v. Tx for mild low K 3.0 on feb 18 with oral 40 meq Pot cl but then diarrhea as above.  Sleeping separate , no other infectious contacts.  Only ate out x 1 but likely and liekly had diarrhea start just prior.  Still taking lasix.  Trying to keep up fluids per hematology but also trying not to overdue it per cardiology.  Taking lasix the last few days.  Pt denies chest pain, increased sob or doe, wheezing, orthopnea, PND, increased LE swelling, palpitations, dizziness or syncope, except for some lightheaded last evening to get up the BR and not over tachycardic.         Wt Readings from Last 3 Encounters:  10/17/23 146 lb 6.4 oz (66.4 kg)  10/15/23 170 lb (77.1 kg)  10/08/23 170 lb (77.1 kg)   BP Readings from Last 3 Encounters:  10/18/23 122/64  10/17/23 98/62  10/16/23 (!) 129/45         Past Medical History:  Diagnosis Date   ABLA (acute blood loss anemia) 04/16/2022   Congenital malformation of intestinal fixation    Gastritis    Gastroesophageal reflux disease without esophagitis 09/10/2021   IDA (iron deficiency  anemia)    Mitral valve prolapse    Persistent hyperplasia of thymus (HCC)    S/P Thymectomy   Thrombocytosis    Past Surgical History:  Procedure Laterality Date   BIOPSY  06/26/2022   Procedure: BIOPSY;  Surgeon: Jenel Lucks, MD;  Location: [facility];  Service: Gastroenterology;;   COLONOSCOPY N/A 04/17/2022   Procedure: COLONOSCOPY;  Surgeon: Beverley Fiedler, MD;  Location: [facility];  Service: Gastroenterology;  Laterality: N/A;   COLONOSCOPY WITH PROPOFOL N/A 06/26/2022   Procedure: COLONOSCOPY WITH PROPOFOL;  Surgeon: Jenel Lucks, MD;  Location: [facility];  Service: Gastroenterology;  Laterality: N/A;   ENTEROSCOPY N/A 04/17/2022   Procedure: ENTEROSCOPY;  Surgeon: Beverley Fiedler, MD;  Location: [facility];  Service: Gastroenterology;  Laterality: N/A;   ENTEROSCOPY N/A 06/26/2022   Procedure: ENTEROSCOPY;  Surgeon: Jenel Lucks, MD;  Location: [facility];  Service: Gastroenterology;  Laterality: N/A;   GIVENS CAPSULE STUDY N/A 06/26/2022   Procedure: GIVENS CAPSULE STUDY;  Surgeon: Jenel Lucks, MD;  Location: [facility];  Service: Gastroenterology;  Laterality: N/A;   HEMOSTASIS CLIP PLACEMENT  04/17/2022   Procedure: HEMOSTASIS CLIP PLACEMENT;  Surgeon: Beverley Fiedler, MD;  Location: [facility];  Service: Gastroenterology;;   HEMOSTASIS CLIP PLACEMENT  06/26/2022  Procedure: HEMOSTASIS CLIP PLACEMENT;  Surgeon: Jenel Lucks, MD;  Location: [facility];  Service: Gastroenterology;;   HOT HEMOSTASIS N/A 04/17/2022   Procedure: HOT HEMOSTASIS (ARGON PLASMA COAGULATION/BICAP);  Surgeon: Beverley Fiedler, MD;  Location: [facility];  Service: Gastroenterology;  Laterality: N/A;   HOT HEMOSTASIS N/A 06/26/2022   Procedure: HOT HEMOSTASIS (ARGON PLASMA COAGULATION/BICAP);  Surgeon: Jenel Lucks, MD;  Location: [facility];  Service: Gastroenterology;  Laterality: N/A;   POLYPECTOMY  04/17/2022   Procedure: POLYPECTOMY;  Surgeon:  Beverley Fiedler, MD;  Location: [facility];  Service: Gastroenterology;;   POLYPECTOMY  06/26/2022   Procedure: POLYPECTOMY;  Surgeon: Jenel Lucks, MD;  Location: [facility];  Service: Gastroenterology;;   SMALL INTESTINE SURGERY  1994   SUBMUCOSAL TATTOO INJECTION  06/26/2022   Procedure: SUBMUCOSAL TATTOO INJECTION;  Surgeon: Jenel Lucks, MD;  Location: [facility];  Service: Gastroenterology;;   THYMECTOMY N/A     reports that she quit smoking about 23 years ago. Her smoking use included cigarettes. She has never used smokeless tobacco. She reports that she does not currently use alcohol. She reports that she does not use drugs. family history includes Alcoholism in her half-sister; Healthy in her sister; Heart disease in her mother; Valvular heart disease in her mother. Allergies  Allergen Reactions   Aspirin Other (See Comments)    "Due to intestinal issue"   Sulfa Antibiotics Other (See Comments)    bleeding   Current Outpatient Medications on File Prior to Visit  Medication Sig Dispense Refill   acetaminophen (TYLENOL) 500 MG tablet Take 1,000 mg by mouth every 6 (six) hours as needed (pain.).     Ascorbic Acid (VITAMIN C) 1000 MG tablet Take 1,000 mg by mouth in the morning.     cholecalciferol (VITAMIN D3) 25 MCG (1000 UNIT) tablet Take 1,000 Units by mouth in the morning.     famotidine (PEPCID) 40 MG tablet TAKE 1 TABLET(40 MG) BY MOUTH DAILY (Patient taking differently: Take 40 mg by mouth at bedtime.) 90 tablet 0   folic acid (FOLVITE) 1 MG tablet Take 1 tablet (1 mg total) by mouth daily. (Patient taking differently: Take 1 mg by mouth at bedtime.) 90 tablet 1   furosemide (LASIX) 20 MG tablet Take 1 tablet (20 mg total) by mouth daily. 15 tablet 0   hydroxyurea (HYDREA) 500 MG capsule Take 1 capsule (500 mg total) by mouth 2 (two) times daily. (Patient taking differently: Take 500 mg by mouth See admin instructions. Take 1 capsule (500 mg) by mouth in the  morning & take 2 capsules (1000 mg) by mouth at night.) 180 capsule 1   metoprolol tartrate (LOPRESSOR) 25 MG tablet Take 1/2 tablet ( 12.5 mg ) twice a day (Patient taking differently: Take 12.5 mg by mouth 2 (two) times daily as needed (palpitation.).) 90 tablet 3   Multiple Vitamins-Minerals (PRESERVISION AREDS 2+MULTI VIT PO) Take 1 capsule by mouth 2 (two) times daily.     sucralfate (CARAFATE) 1 g tablet TAKE 1 TABLET(1 GRAM) BY MOUTH FOUR TIMES DAILY AT BEDTIME WITH MEALS (Patient taking differently: Take 1 g by mouth at bedtime.) 360 tablet 1   Glucosamine-Chondroitin (COSAMIN DS PO) Take 1 tablet by mouth daily.     Multiple Vitamin (MULTIVITAMIN WITH MINERALS) TABS tablet Take 1 tablet by mouth in the morning.     No current facility-administered medications on file prior to visit.        ROS:  All others reviewed  and negative.  Objective        PE:  BP 122/64 (BP Location: Right Arm, Patient Position: Sitting, Cuff Size: Normal)   Pulse 78   Temp 98.1 F (36.7 C) (Oral)   Ht 5\' 6"  (1.676 m)   LMP  (LMP Unknown)   SpO2 99%   BMI 23.63 kg/m                 Constitutional: Pt appears in NAD               HENT: Head: NCAT.                Right Ear: External ear normal.                 Left Ear: External ear normal.                Eyes: . Pupils are equal, round, and reactive to light. Conjunctivae and EOM are normal               Nose: without d/c or deformity               Neck: Neck supple. Gross normal ROM               Cardiovascular: Normal rate and regular rhythm.  With gr 3/6 sys murmur               Pulmonary/Chest: Effort normal and breath sounds without rales or wheezing.                Abd:  Soft, NT, ND, + BS, no organomegaly               Neurological: Pt is alert. At baseline orientation, motor grossly intact               Skin: Skin is warm. No rashes, no other new lesions, LE edema - none               Psychiatric: Pt behavior is normal without agitation    Micro: none  Cardiac tracings I have personally interpreted today:  none  Pertinent Radiological findings (summarize): none   Lab Results  Component Value Date   WBC 3.5 (L) 10/15/2023   HGB 12.2 10/15/2023   HCT 35.8 (L) 10/15/2023   PLT 663 (H) 10/15/2023   GLUCOSE 106 (H) 10/15/2023   ALT 25 10/15/2023   AST 30 10/15/2023   NA 140 10/15/2023   K 3.0 (L) 10/15/2023   CL 107 10/15/2023   CREATININE 0.73 10/15/2023   BUN 17 10/15/2023   CO2 20 (L) 10/15/2023   TSH 3.341 06/26/2022   INR 1.0 06/24/2022   HGBA1C 4.4 (L) 04/16/2022   Assessment/Plan:  Kristina Osborne is a 73 y.o. White or Caucasian [1] female with  has a past medical history of ABLA (acute blood loss anemia) (04/16/2022), Congenital malformation of intestinal fixation, Gastritis, Gastroesophageal reflux disease without esophagitis (09/10/2021), IDA (iron deficiency anemia), Mitral valve prolapse, Persistent hyperplasia of thymus (HCC), and Thrombocytosis.  Diarrhea With mild abd pain , no fever or blood but had recent antiobiotic x 2 - for GI profile r/o c diff or other, for probiotic asd,  to f/u any worsening symptoms or concerns  Gastroesophageal reflux disease without esophagitis Stable overall,  to f/u any worsening symptoms or concerns  Hypokalemia Mild, s/p oral replacement, with ongoing diarrhea and lasix since then, pt declines f/u lab today but has lab per  cardiology soon  Severe mitral regurgitation Likely recent mild lower volume but appears improved with less GI symptoms, pt to continue lasix for now, f/u TEE as planned  Iron deficiency anemia Lab Results  Component Value Date   WBC 3.5 (L) 10/15/2023   HGB 12.2 10/15/2023   HCT 35.8 (L) 10/15/2023   MCV 120.5 (H) 10/15/2023   PLT 663 (H) 10/15/2023    Stable, cont current med tx  Followup: Return in about 3 months (around 01/15/2024).  Oliver Barre, MD 10/18/2023 9:13 PM [facility]

## 2023-10-19 LAB — CBC WITH DIFFERENTIAL/PLATELET

## 2023-10-19 NOTE — Telephone Encounter (Signed)
First attempt to call patient for hospital follow up.

## 2023-10-20 LAB — BASIC METABOLIC PANEL
BUN/Creatinine Ratio: 11 — ABNORMAL LOW (ref 12–28)
BUN: 9 mg/dL (ref 8–27)
CO2: 22 mmol/L (ref 20–29)
Calcium: 10.1 mg/dL (ref 8.7–10.3)
Chloride: 104 mmol/L (ref 96–106)
Creatinine, Ser: 0.82 mg/dL (ref 0.57–1.00)
Glucose: 94 mg/dL (ref 70–99)
Potassium: 5.5 mmol/L — ABNORMAL HIGH (ref 3.5–5.2)
Sodium: 140 mmol/L (ref 134–144)
eGFR: 76 mL/min/{1.73_m2} (ref >59)

## 2023-10-20 LAB — CBC WITH DIFFERENTIAL/PLATELET
Basophils Absolute: 0 10*3/uL (ref 0.0–0.2)
Basos: 1 %
EOS (ABSOLUTE): 0.1 10*3/uL (ref 0.0–0.4)
Eos: 2 %
Hematocrit: 37.8 % (ref 34.0–46.6)
Hemoglobin: 13.1 g/dL (ref 11.1–15.9)
Immature Grans (Abs): 0 10*3/uL (ref 0.0–0.1)
Immature Granulocytes: 0 %
Lymphocytes Absolute: 0.5 10*3/uL — ABNORMAL LOW (ref 0.7–3.1)
Lymphs: 14 %
MCH: 41.5 pg — ABNORMAL HIGH (ref 26.6–33.0)
MCHC: 34.7 g/dL (ref 31.5–35.7)
MCV: 120 fL — ABNORMAL HIGH (ref 79–97)
Monocytes Absolute: 0.5 10*3/uL (ref 0.1–0.9)
Monocytes: 14 %
Neutrophils Absolute: 2.7 10*3/uL (ref 1.4–7.0)
Neutrophils: 69 %
Platelets: 895 10*3/uL (ref 150–450)
RBC: 3.16 x10E6/uL — ABNORMAL LOW (ref 3.77–5.28)
RDW: 14.5 % (ref 11.7–15.4)
WBC: 3.9 10*3/uL (ref 3.4–10.8)

## 2023-10-21 ENCOUNTER — Encounter: Payer: Self-pay | Admitting: Internal Medicine

## 2023-10-21 LAB — GI PROFILE, STOOL, PCR

## 2023-10-22 ENCOUNTER — Ambulatory Visit: Payer: Medicare Other | Admitting: Pulmonary Disease

## 2023-10-23 ENCOUNTER — Other Ambulatory Visit: Payer: Self-pay | Admitting: Pulmonary Disease

## 2023-10-23 ENCOUNTER — Telehealth: Payer: Self-pay | Admitting: Internal Medicine

## 2023-10-23 DIAGNOSIS — I340 Nonrheumatic mitral (valve) insufficiency: Secondary | ICD-10-CM

## 2023-10-23 MED ORDER — FUROSEMIDE 20 MG PO TABS
20.0000 mg | ORAL_TABLET | Freq: Every day | ORAL | 3 refills | Status: DC
Start: 2023-10-23 — End: 2023-11-02

## 2023-10-23 NOTE — Progress Notes (Signed)
 Called patient with pre-procedure instructions for tomorrow.   Patient informed of:   Time to arrive for procedure. 0700 Remain NPO past midnight.  Must have a ride home and a responsible adult to remain with them for 24 hours post procedure.  Confirmed blood thinner. Confirmed no breaks in taking blood thinner for 3+ weeks prior to procedure. Confirmed patient stopped all GLP-1s and GLP-2s for at least one week before procedure.

## 2023-10-23 NOTE — Telephone Encounter (Signed)
*  STAT* If patient is at the pharmacy, call can be transferred to refill team.   1. Which medications need to be refilled? (please list name of each medication and dose if known)   furosemide (LASIX) 20 MG tablet    4. Which pharmacy/location (including street and city if local pharmacy) is medication to be sent to?[id number] - [place], [place] - [address] AT [facility] OF [address]    5. Do they need a 30 day or 90 day supply? 90

## 2023-10-24 ENCOUNTER — Encounter (HOSPITAL_COMMUNITY)
Admission: RE | Disposition: A | Discharge status: Home / Self Care | Payer: Self-pay | Source: Home / Self Care | Attending: Internal Medicine

## 2023-10-24 ENCOUNTER — Ambulatory Visit (HOSPITAL_COMMUNITY): Payer: Medicare Other

## 2023-10-24 ENCOUNTER — Other Ambulatory Visit: Payer: Self-pay

## 2023-10-24 ENCOUNTER — Ambulatory Visit (HOSPITAL_COMMUNITY)
Admission: RE | Admit: 2023-10-24 | Discharge: 2023-10-24 | Disposition: A | Discharge status: Home / Self Care | Payer: Medicare Other | Attending: Internal Medicine | Admitting: Internal Medicine

## 2023-10-24 ENCOUNTER — Encounter (HOSPITAL_COMMUNITY): Payer: Self-pay | Admitting: Internal Medicine

## 2023-10-24 ENCOUNTER — Telehealth: Payer: Self-pay | Admitting: Internal Medicine

## 2023-10-24 ENCOUNTER — Ambulatory Visit (HOSPITAL_BASED_OUTPATIENT_CLINIC_OR_DEPARTMENT_OTHER): Payer: Medicare Other | Admitting: Anesthesiology

## 2023-10-24 ENCOUNTER — Ambulatory Visit (HOSPITAL_COMMUNITY): Payer: Medicare Other | Admitting: Anesthesiology

## 2023-10-24 DIAGNOSIS — I341 Nonrheumatic mitral (valve) prolapse: Secondary | ICD-10-CM | POA: Insufficient documentation

## 2023-10-24 DIAGNOSIS — Z8249 Family history of ischemic heart disease and other diseases of the circulatory system: Secondary | ICD-10-CM | POA: Insufficient documentation

## 2023-10-24 DIAGNOSIS — I493 Ventricular premature depolarization: Secondary | ICD-10-CM | POA: Diagnosis not present

## 2023-10-24 DIAGNOSIS — I340 Nonrheumatic mitral (valve) insufficiency: Secondary | ICD-10-CM

## 2023-10-24 DIAGNOSIS — J90 Pleural effusion, not elsewhere classified: Secondary | ICD-10-CM | POA: Insufficient documentation

## 2023-10-24 DIAGNOSIS — R002 Palpitations: Secondary | ICD-10-CM | POA: Diagnosis not present

## 2023-10-24 DIAGNOSIS — I10 Essential (primary) hypertension: Secondary | ICD-10-CM | POA: Diagnosis not present

## 2023-10-24 DIAGNOSIS — Z87891 Personal history of nicotine dependence: Secondary | ICD-10-CM | POA: Diagnosis not present

## 2023-10-24 DIAGNOSIS — Z79899 Other long term (current) drug therapy: Secondary | ICD-10-CM | POA: Diagnosis not present

## 2023-10-24 DIAGNOSIS — K219 Gastro-esophageal reflux disease without esophagitis: Secondary | ICD-10-CM | POA: Insufficient documentation

## 2023-10-24 HISTORY — PX: TRANSESOPHAGEAL ECHOCARDIOGRAM (CATH LAB): EP1270

## 2023-10-24 LAB — POCT I-STAT, CHEM 8
BUN: 12 mg/dL (ref 8–23)
Calcium, Ion: 1.14 mmol/L — ABNORMAL LOW (ref 1.15–1.40)
Chloride: 109 mmol/L (ref 98–111)
Creatinine, Ser: 1 mg/dL (ref 0.44–1.00)
Glucose, Bld: 100 mg/dL — ABNORMAL HIGH (ref 70–99)
HCT: 36 % (ref 36.0–46.0)
Hemoglobin: 12.2 g/dL (ref 12.0–15.0)
Potassium: 3.2 mmol/L — ABNORMAL LOW (ref 3.5–5.1)
Sodium: 144 mmol/L (ref 135–145)
TCO2: 20 mmol/L — ABNORMAL LOW (ref 22–32)

## 2023-10-24 LAB — ECHO TEE
MV M vel: 3.09 m/s
MV Peak grad: 38.2 mm[Hg]
Radius: 1 cm

## 2023-10-24 SURGERY — TRANSESOPHAGEAL ECHOCARDIOGRAM (TEE) (CATHLAB)
Anesthesia: Monitor Anesthesia Care

## 2023-10-24 MED ORDER — POTASSIUM CHLORIDE CRYS ER 20 MEQ PO TBCR
EXTENDED_RELEASE_TABLET | ORAL | Status: AC
  Filled 2023-10-24: qty 2

## 2023-10-24 MED ORDER — PROPOFOL 10 MG/ML IV BOLUS
INTRAVENOUS | Status: DC | PRN
  Administered 2023-10-24: 250 ug/kg/min via INTRAVENOUS
  Administered 2023-10-24 (×2): 50 mg via INTRAVENOUS

## 2023-10-24 MED ORDER — POTASSIUM CHLORIDE CRYS ER 20 MEQ PO TBCR
40.0000 meq | EXTENDED_RELEASE_TABLET | ORAL | Status: AC
  Administered 2023-10-24: 40 meq via ORAL

## 2023-10-24 MED ORDER — SODIUM CHLORIDE 0.9 % IV SOLN: INTRAVENOUS | Status: DC

## 2023-10-24 NOTE — Interval H&P Note (Signed)
 History and Physical Interval Note:  10/24/2023 8:22 AM  Kristina Osborne  has presented today for surgery, with the diagnosis of MITRAL VALVE INSUFFICIENCY.  The various methods of treatment have been discussed with the patient and family. After consideration of risks, benefits and other options for treatment, the patient has consented to  Procedure(s): TRANSESOPHAGEAL ECHOCARDIOGRAM (N/A) as a surgical intervention.  The patient's history has been reviewed, patient examined, no change in status, stable for surgery.  I have reviewed the patient's chart and labs.  Questions were answered to the patient's satisfaction.     [name]

## 2023-10-24 NOTE — Anesthesia Preprocedure Evaluation (Signed)
 Anesthesia Evaluation  Patient identified by MRN, date of birth, ID band Patient awake    Reviewed: Allergy & Precautions, NPO status , Patient's Chart, lab work & pertinent test results, reviewed documented beta blocker date and time   Airway Mallampati: II  TM Distance: >3 FB Neck ROM: Full    Dental  (+) Teeth Intact, Dental Advisory Given   Pulmonary former smoker   Pulmonary exam normal breath sounds clear to auscultation       Cardiovascular hypertension, Pt. on home beta blockers + Valvular Problems/Murmurs MVP and MR  Rhythm:Regular Rate:Normal + Systolic murmurs    Neuro/Psych  Headaches  Neuromuscular disease    GI/Hepatic Neg liver ROS,GERD  ,,  Endo/Other  negative endocrine ROS    Renal/GU negative Renal ROS     Musculoskeletal negative musculoskeletal ROS (+)    Abdominal   Peds  Hematology negative hematology ROS (+)   Anesthesia Other Findings Day of surgery medications reviewed with the patient.  Reproductive/Obstetrics                              Anesthesia Physical Anesthesia Plan  ASA: 4  Anesthesia Plan: MAC   Post-op Pain Management:    Induction: Intravenous  PONV Risk Score and Plan: 2 and TIVA  Airway Management Planned: Natural Airway and Simple Face Mask  Additional Equipment:   Intra-op Plan:   Post-operative Plan:   Informed Consent: I have reviewed the patients History and Physical, chart, labs and discussed the procedure including the risks, benefits and alternatives for the proposed anesthesia with the patient or authorized representative who has indicated his/her understanding and acceptance.     Dental advisory given  Plan Discussed with: CRNA and Anesthesiologist  Anesthesia Plan Comments:          Anesthesia Quick Evaluation

## 2023-10-24 NOTE — Transfer of Care (Signed)
 Immediate Anesthesia Transfer of Care Note  Patient: Kristina Osborne  Procedure(s) Performed: TRANSESOPHAGEAL ECHOCARDIOGRAM  Patient Location: PACU  Anesthesia Type:General  Level of Consciousness: awake, alert , and oriented  Airway & Oxygen Therapy: Patient Spontanous Breathing  Post-op Assessment: Report given to RN  Post vital signs: Reviewed and stable  Last Vitals:  Vitals Value Taken Time  BP 94/58 10/24/23 0912  Temp 36.7 C 10/24/23 0911  Pulse 78 10/24/23 0913  Resp 25 10/24/23 0913  SpO2 100 % 10/24/23 0913  Vitals shown include unfiled device data.  Last Pain:  Vitals:   10/24/23 0911  TempSrc: Temporal  PainSc: 0-No pain         Complications: No notable events documented.

## 2023-10-24 NOTE — Telephone Encounter (Signed)
 Patient would like to further discuss lab results. She would like to know if she should limit daily activity such as walking. She states due to platelet issue(s) she normally walks up to 8,000 steps daily to avoid clots. Please advise.

## 2023-10-24 NOTE — Anesthesia Procedure Notes (Signed)
 Procedure Name: MAC Date/Time: 10/24/2023 8:49 AM  Performed by: Bartholomew Crews, CRNAPre-anesthesia Checklist: Patient identified, Emergency Drugs available, Suction available, Patient being monitored and Timeout performed Patient Re-evaluated:Patient Re-evaluated prior to induction Preoxygenation: Pre-oxygenation with 100% oxygen Induction Type: IV induction Placement Confirmation: positive ETCO2 Dental Injury: Teeth and Oropharynx as per pre-operative assessment

## 2023-10-24 NOTE — Progress Notes (Signed)
*  PRELIMINARY RESULTS* Echocardiogram Echocardiogram Transesophageal has been performed.  Kristina Osborne 10/24/2023, 9:24 AM

## 2023-10-24 NOTE — CV Procedure (Signed)
 TRANSESOPHAGEAL ECHOCARDIOGRAM (TEE) NOTE  INDICATIONS: Mitral regurgitation  PROCEDURE:   Informed consent was obtained prior to the procedure. The risks, benefits and alternatives for the procedure were discussed and the patient comprehended these risks.  Risks include, but are not limited to, cough, sore throat, vomiting, nausea, somnolence, esophageal and stomach trauma or perforation, bleeding, low blood pressure, aspiration, pneumonia, infection, trauma to the teeth and death.    After a procedural time-out, the patient was given propofol for sedation by anesthesia. See their separate report.  The patient's heart rate, blood pressure, and oxygen saturation are monitored continuously during the procedure.The oropharynx was anesthetized with topical cetacaine.  The transesophageal probe was inserted in the esophagus and stomach without difficulty and multiple views were obtained.  The patient was kept under observation until the patient left the procedure room.  I was present face-to-face 100% of this time. The patient left the procedure room in stable condition.   Agitated microbubble saline contrast was administered.  COMPLICATIONS:    There were no immediate complications.  Findings:  LEFT VENTRICLE: The left ventricular wall thickness is normal.  The left ventricular cavity is normal in size. Wall motion is normal.  LVEF is 60-65%.  RIGHT VENTRICLE:  The right ventricle is normal in structure and function without any thrombus or masses.    LEFT ATRIUM:  The left atrium is severely dilated in size without any thrombus or masses.  There is not spontaneous echo contrast ("smoke") in the left atrium consistent with a low flow state.  LEFT ATRIAL APPENDAGE:  The left atrial appendage is free of any thrombus or masses. The appendage has single lobes. Pulse doppler indicates moderate flow in the appendage.  ATRIAL SEPTUM:  The atrial septum appears intact and is free of thrombus  and/or masses.  There is no evidence for interatrial shunting by color doppler and saline microbubble.  RIGHT ATRIUM:  The right atrium is normal in size and function without any thrombus or masses.  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 There is  Severe eccentric antero-septally directed regurgitation.  This was well visualized in 2D and 3D modes. There were no vegetations or stenosis. No appreciable posterior mitral annular calcification was noted.  AORTIC VALVE:  The aortic valve is trileaflet, normal in structure and function with  no  regurgitation.  There were no vegetations or stenosis  TRICUSPID VALVE:  The tricuspid valve is normal in structure and function with Mild regurgitation.  There were no vegetations or stenosis   PULMONIC VALVE:  The pulmonic valve is normal in structure and function with  trivial  regurgitation.  There were no vegetations or stenosis.   AORTIC ARCH, ASCENDING AND DESCENDING AORTA:  There was grade 1 Myrtis Ser et. Al, 1992) atherosclerosis of the aortic arch.  12. PULMONARY VEINS: Anomalous pulmonary venous return was not noted.  13. PERICARDIUM: The pericardium appeared normal and non-thickened.  There is no pericardial effusion.  IMPRESSION:   Severe eccentric MR secondary to severe prolapse/flail posterior mitral leaflet. No significant annular calcification Severe LAE No LAA thrombus Negative for PFO by color doppler and saline microbubble contrast LVEF 60-65%  RECOMMENDATIONS:    Placed referral to Dr. Leafy Ro with CT surgery to evaluate for mitral valve repair.  Time Spent Directly with the Patient:  60 minutes   [name], MD, [facility], FACP  [place]  [facility]  Medical Director of  the Advanced Lipid Disorders &  Cardiovascular Risk Reduction Clinic Diplomate of the American Board of Clinical Lipidology Attending Cardiologist  Direct Dial:  [phone]  Fax: [phone]  Website:  [email or web address] Nicely Keshon Markovitz 10/24/2023, 9:15 AM

## 2023-10-24 NOTE — Anesthesia Postprocedure Evaluation (Signed)
 Anesthesia Post Note  Patient: Kristina Osborne  Procedure(s) Performed: TRANSESOPHAGEAL ECHOCARDIOGRAM     Patient location during evaluation: Cath Lab Anesthesia Type: MAC Level of consciousness: oriented, awake and alert and awake Pain management: pain level controlled Vital Signs Assessment: post-procedure vital signs reviewed and stable Respiratory status: spontaneous breathing, nonlabored ventilation, respiratory function stable and patient connected to nasal cannula oxygen Cardiovascular status: blood pressure returned to baseline and stable Postop Assessment: no headache, no backache and no apparent nausea or vomiting Anesthetic complications: no   No notable events documented.  Last Vitals:  Vitals:   10/24/23 0940 10/24/23 0945  BP: 105/74 102/67  Pulse: 76 70  Resp: (!) 22 17  Temp:    SpO2: 100% 99%    Last Pain:  Vitals:   10/24/23 0945  TempSrc:   PainSc: 0-No pain                 Collene Schlichter

## 2023-10-24 NOTE — Telephone Encounter (Signed)
 Patient identification verified by 2 forms. Marilynn Rail, RN    Called and spoke to patient  Patient states:   -completed TEE this morning with Dr. Rennis Golden   -Dr. Rennis Golden provided results, she has severe mitral regurgitation   -Dr. Rennis Golden stated next steps would be meeting with surgeon   -she has a clotting issue, walks 8,000 steps daily   -with new findings she would like to know what he activity limits are   -she does not want to be in active and possibly develop a clot  Informed patient message sent to Dr. Rennis Golden

## 2023-10-25 NOTE — Telephone Encounter (Signed)
 [name], MD  You8 minutes ago (5:07 PM)    I messaged scheduling to do the referral to Dr. Eugenio Hoes (CT surgery) - I do not see that is was done yet. If you can place the referral for ASAP, that would be great.  Dr Dyann Ruddle, Lisette Abu, MD  You10 minutes ago (5:05 PM)    It was supposed to be - thanks .Marland Kitchen Please try to reschedule in the Summer    Noted  Referral placed for Dr. Eugenio Hoes Cardio Thoracic Surgery (urgent)

## 2023-10-25 NOTE — Telephone Encounter (Signed)
 Patient identification verified by 2 forms. Marilynn Rail, RN    Called and spoke to patient  Informed patient:   -referral placed for Dr. Leafy Ro   -3/7 OV can be rescheduled  Patient appointment scheduled for 6/27 at 8:40am  Patient agrees, no questions at this time

## 2023-10-25 NOTE — Telephone Encounter (Signed)
 Kristina Nose, MD  You17 hours ago (4:17 PM)    She doesn't have to restrict walking - I just told her today if she gets short of breath, that she should stop and rest.  With her leaky mitral valve, she may get short of breath easier.  Dr. Rexene Edison   Patient identification verified by 2 forms. Kristina Rail, RN    Called and spoke to patient  Relayed provider message  Advised patient to follow up at 3/7 OV with Dr. Rennis Golden  Patient states:   -recalls Dr. Rennis Golden stating he would cancel 3/7 OV since results are available   -Dr. Rennis Golden recommended referral to surgeon for follow up   -would like to know what the plan is  Per patient request, message sent to Dr. Rennis Golden for input/advisement

## 2023-10-27 DIAGNOSIS — I493 Ventricular premature depolarization: Secondary | ICD-10-CM | POA: Diagnosis not present

## 2023-10-27 DIAGNOSIS — E876 Hypokalemia: Secondary | ICD-10-CM

## 2023-10-27 DIAGNOSIS — R002 Palpitations: Secondary | ICD-10-CM

## 2023-10-27 DIAGNOSIS — I341 Nonrheumatic mitral (valve) prolapse: Secondary | ICD-10-CM

## 2023-10-27 DIAGNOSIS — I340 Nonrheumatic mitral (valve) insufficiency: Secondary | ICD-10-CM | POA: Diagnosis not present

## 2023-10-29 ENCOUNTER — Telehealth: Payer: Self-pay | Admitting: Internal Medicine

## 2023-10-29 ENCOUNTER — Other Ambulatory Visit: Payer: Self-pay | Admitting: Internal Medicine

## 2023-10-29 DIAGNOSIS — I340 Nonrheumatic mitral (valve) insufficiency: Secondary | ICD-10-CM

## 2023-10-29 DIAGNOSIS — I341 Nonrheumatic mitral (valve) prolapse: Secondary | ICD-10-CM

## 2023-10-29 NOTE — Telephone Encounter (Signed)
 Contacted patient via telephone today to discuss the need for cardiac catheterization as part of a preoperative workup for severe mitral regurgitation which was requested by CT surgery.  I discussed the risk, benefits and alternatives of cardiac catheterization with her and she is agreeable to proceed.  Will try to schedule right and left heart catheterization sometime this week.  Those results will be sent to Dr. Milinda Antis with the plan to see her as soon as he has time available.  Informed Consent   Shared Decision Making/Informed Consent The risks [stroke (1 in 1000), death (1 in 1000), kidney failure [usually temporary] (1 in 500), bleeding (1 in 200), allergic reaction [possibly serious] (1 in 200)], benefits (diagnostic support and management of coronary artery disease) and alternatives of a cardiac catheterization were discussed in detail with Kristina Osborne and she is willing to proceed.    [name], MD, [facility], FACP  [place]  [facility]  Medical Director of the Advanced Lipid Disorders &  Cardiovascular Risk Reduction Clinic Diplomate of the American Board of Clinical Lipidology Attending Cardiologist  Direct Dial: [phone]  Fax: [phone]  Website:  [email or web address]

## 2023-10-29 NOTE — Telephone Encounter (Signed)
 Patient contacted with procedure instructions as noted below. Reviewed in detail. Answered any questions. Advised will send a copy of instructions via email, as she does not check MyChart.

## 2023-10-29 NOTE — Telephone Encounter (Signed)
 Per Dr. Rennis Golden, patient needs to be scheduled for right & left cardiac catheterization as part of pre-surgical work up for MVR/MVP (per TCTS). Dr. Rennis Golden contacted patient today, 10/29/23, about this procedure and obtained consent (note in chart)  Called cardiac cath lab and scheduled right & left heart cath on 11/01/23 with Dr. Kirke Corin at 1:30pm -- arrival at 11:30am.

## 2023-10-29 NOTE — Telephone Encounter (Signed)
  [facility] A DEPT OF [facility] AT [facility] [address] [place] [address] Dept: [phone] Loc: [phone]  Ebony Yorio  10/29/2023  You are scheduled for a Cardiac Catheterization on Thursday, March 6 with Dr. Lorine Bears.  1. Please arrive at the [facility] (Main Entrance A) at [facility]: [address] [place], [place] [address] at 11:30 AM (This time is 2 hour(s) before your procedure to ensure your preparation).   Free valet parking service is available. You will check in at ADMITTING. The support person will be asked to wait in the waiting room.  It is OK to have someone drop you off and come back when you are ready to be discharged.    Special note: Every effort is made to have your procedure done on time. Please understand that emergencies sometimes delay scheduled procedures.  2. Diet: Do not eat solid foods after midnight.  The patient may have clear liquids until 5am upon the day of the procedure.  3. Labs: NO LABS needed  4. Medication instructions in preparation for your procedure:   Contrast Allergy: NO  On the morning of your procedure, take any morning medicines NOT listed above.  You may use sips of water.  5. Plan to go home the same day, you will only stay overnight if medically necessary. 6. Bring a current list of your medications and current insurance cards. 7. You MUST have a responsible person to drive you home. 8. Someone MUST be with you the first 24 hours after you arrive home or your discharge will be delayed. 9. Please wear clothes that are easy to get on and off and wear slip-on shoes.  Thank you for allowing [place] to care for you!   -- [place] [facility]

## 2023-10-30 ENCOUNTER — Encounter (HOSPITAL_BASED_OUTPATIENT_CLINIC_OR_DEPARTMENT_OTHER): Payer: Self-pay | Admitting: Emergency Medicine

## 2023-10-30 ENCOUNTER — Telehealth: Payer: Self-pay | Admitting: *Deleted

## 2023-10-30 ENCOUNTER — Emergency Department (HOSPITAL_BASED_OUTPATIENT_CLINIC_OR_DEPARTMENT_OTHER)

## 2023-10-30 ENCOUNTER — Inpatient Hospital Stay (HOSPITAL_BASED_OUTPATIENT_CLINIC_OR_DEPARTMENT_OTHER)
Admission: EM | Admit: 2023-10-30 | Discharge: 2023-11-02 | DRG: 287 | Disposition: A | Discharge status: Home / Self Care | Attending: Cardiovascular Disease | Admitting: Cardiovascular Disease

## 2023-10-30 ENCOUNTER — Emergency Department (HOSPITAL_BASED_OUTPATIENT_CLINIC_OR_DEPARTMENT_OTHER): Admitting: Radiology

## 2023-10-30 ENCOUNTER — Telehealth: Payer: Self-pay | Admitting: Internal Medicine

## 2023-10-30 ENCOUNTER — Other Ambulatory Visit: Payer: Self-pay

## 2023-10-30 DIAGNOSIS — Z79899 Other long term (current) drug therapy: Secondary | ICD-10-CM

## 2023-10-30 DIAGNOSIS — K219 Gastro-esophageal reflux disease without esophagitis: Secondary | ICD-10-CM | POA: Diagnosis present

## 2023-10-30 DIAGNOSIS — Z886 Allergy status to analgesic agent status: Secondary | ICD-10-CM

## 2023-10-30 DIAGNOSIS — Z8249 Family history of ischemic heart disease and other diseases of the circulatory system: Secondary | ICD-10-CM

## 2023-10-30 DIAGNOSIS — I272 Pulmonary hypertension, unspecified: Secondary | ICD-10-CM | POA: Diagnosis present

## 2023-10-30 DIAGNOSIS — I340 Nonrheumatic mitral (valve) insufficiency: Secondary | ICD-10-CM | POA: Diagnosis not present

## 2023-10-30 DIAGNOSIS — R059 Cough, unspecified: Secondary | ICD-10-CM | POA: Diagnosis present

## 2023-10-30 DIAGNOSIS — D473 Essential (hemorrhagic) thrombocythemia: Secondary | ICD-10-CM | POA: Diagnosis present

## 2023-10-30 DIAGNOSIS — Z882 Allergy status to sulfonamides status: Secondary | ICD-10-CM

## 2023-10-30 DIAGNOSIS — D75839 Thrombocytosis, unspecified: Secondary | ICD-10-CM | POA: Diagnosis present

## 2023-10-30 DIAGNOSIS — I493 Ventricular premature depolarization: Secondary | ICD-10-CM | POA: Diagnosis present

## 2023-10-30 DIAGNOSIS — K5520 Angiodysplasia of colon without hemorrhage: Secondary | ICD-10-CM | POA: Diagnosis present

## 2023-10-30 DIAGNOSIS — I341 Nonrheumatic mitral (valve) prolapse: Secondary | ICD-10-CM

## 2023-10-30 DIAGNOSIS — E860 Dehydration: Secondary | ICD-10-CM | POA: Diagnosis present

## 2023-10-30 DIAGNOSIS — I5032 Chronic diastolic (congestive) heart failure: Secondary | ICD-10-CM | POA: Diagnosis present

## 2023-10-30 DIAGNOSIS — I38 Endocarditis, valve unspecified: Secondary | ICD-10-CM | POA: Diagnosis not present

## 2023-10-30 DIAGNOSIS — I5030 Unspecified diastolic (congestive) heart failure: Secondary | ICD-10-CM | POA: Insufficient documentation

## 2023-10-30 DIAGNOSIS — D509 Iron deficiency anemia, unspecified: Secondary | ICD-10-CM | POA: Diagnosis present

## 2023-10-30 LAB — RESP PANEL BY RT-PCR (RSV, FLU A&B, COVID)  RVPGX2
Influenza A by PCR: NEGATIVE
Influenza B by PCR: NEGATIVE
Resp Syncytial Virus by PCR: NEGATIVE
SARS Coronavirus 2 by RT PCR: NEGATIVE

## 2023-10-30 LAB — TSH: TSH: 1.488 u[IU]/mL (ref 0.350–4.500)

## 2023-10-30 LAB — CBC
HCT: 38.6 % (ref 36.0–46.0)
Hemoglobin: 13.2 g/dL (ref 12.0–15.0)
MCH: 40.5 pg — ABNORMAL HIGH (ref 26.0–34.0)
MCHC: 34.2 g/dL (ref 30.0–36.0)
MCV: 118.4 fL — ABNORMAL HIGH (ref 80.0–100.0)
Platelets: 402 10*3/uL — ABNORMAL HIGH (ref 150–400)
RBC: 3.26 MIL/uL — ABNORMAL LOW (ref 3.87–5.11)
RDW: 14.6 % (ref 11.5–15.5)
WBC: 7.2 10*3/uL (ref 4.0–10.5)
nRBC: 0 % (ref 0.0–0.2)

## 2023-10-30 LAB — BASIC METABOLIC PANEL
Anion gap: 9 (ref 5–15)
BUN: 19 mg/dL (ref 8–23)
CO2: 27 mmol/L (ref 22–32)
Calcium: 9.7 mg/dL (ref 8.9–10.3)
Chloride: 101 mmol/L (ref 98–111)
Creatinine, Ser: 0.79 mg/dL (ref 0.44–1.00)
GFR, Estimated: >60 mL/min (ref >60)
Glucose, Bld: 106 mg/dL — ABNORMAL HIGH (ref 70–99)
Potassium: 3.7 mmol/L (ref 3.5–5.1)
Sodium: 137 mmol/L (ref 135–145)

## 2023-10-30 LAB — BRAIN NATRIURETIC PEPTIDE: B Natriuretic Peptide: 99.4 pg/mL (ref 0.0–100.0)

## 2023-10-30 LAB — T4, FREE: Free T4: 0.88 ng/dL (ref 0.61–1.12)

## 2023-10-30 LAB — TROPONIN I (HIGH SENSITIVITY)
Troponin I (High Sensitivity): 4 ng/L (ref <18)
Troponin I (High Sensitivity): 3 ng/L (ref <18)

## 2023-10-30 MED ORDER — ACETAMINOPHEN 325 MG PO TABS
650.0000 mg | ORAL_TABLET | Freq: Once | ORAL | Status: AC
  Administered 2023-10-30: 650 mg via ORAL
  Filled 2023-10-30: qty 2

## 2023-10-30 MED ORDER — IOHEXOL 350 MG/ML SOLN
100.0000 mL | Freq: Once | INTRAVENOUS | Status: AC | PRN
  Administered 2023-10-30: 75 mL via INTRAVENOUS

## 2023-10-30 NOTE — Telephone Encounter (Signed)
 Received a call from patient she stated at lunch today she noticed she was more sob,very weak.No chest pain.Stated she has a frequent cough,coughing clear phlegm.Heart rate when she stands up 130.Stated she just don't feel right.Advised she needs to go to ED to be evaluated.I will make Dr.Hilty aware.

## 2023-10-30 NOTE — Progress Notes (Deleted)
   Acute Office Visit  Subjective:     Patient ID: Kristina Osborne, female    DOB: 01/18/1951, 73 y.o.   MRN: [id number]  No chief complaint on file.   HPI Patient is in today for evaluation of ***, for the last ***. Has tried Denies known sick contacts, Denies abdominal pain, nausea, vomiting, diarrhea, rash, fever, chills, other symptoms.  Medical hx as outlined below.  ROS Per HPI      Objective:    LMP  (LMP Unknown)    Physical Exam Vitals and nursing note reviewed.  HENT:     Head: Normocephalic and atraumatic.     Nose: No congestion.     Mouth/Throat:     Mouth: Mucous membranes are moist.     Pharynx: Oropharynx is clear. No oropharyngeal exudate or posterior oropharyngeal erythema.  Eyes:     Extraocular Movements: Extraocular movements intact.  Cardiovascular:     Rate and Rhythm: Normal rate and regular rhythm.  Pulmonary:     Effort: Pulmonary effort is normal. No respiratory distress.  Musculoskeletal:     Cervical back: Normal range of motion and neck supple.  Lymphadenopathy:     Cervical: No cervical adenopathy.  Skin:    General: Skin is warm and dry.  Neurological:     Mental Status: She is alert.   No results found for any visits on 10/31/23.      Assessment & Plan:  ***  No orders of the defined types were placed in this encounter.   No follow-ups on file.  Moshe Cipro, FNP

## 2023-10-30 NOTE — Telephone Encounter (Addendum)
 Cardiac Catheterization scheduled at [facility] for: Thursday November 01, 2023 1:30 PM Arrival time [facility] Main Entrance A at: 11 AM-needs BMP  Nothing to eat after midnight prior to procedure, clear liquids until 5 AM day of procedure.  Medication instructions: -Hold:  Lasix-AM of procedure -Usual morning medications can be taken with sips of water.  Plan to go home the same day, you will only stay overnight if medically necessary.  You must have responsible adult to drive you home.  Someone must be with you the first 24 hours after you arrive home.   Reviewed procedure instructions with patient 10/29/23.

## 2023-10-30 NOTE — ED Notes (Signed)
 Pt with concerns of catching a virus. Pt states she is stuffy has a cough and feels hot even though she is not running any fevers. Pt denies any shortness of breath. Pt states that when she stands up she feels like her heart rate jumps up.

## 2023-10-30 NOTE — ED Provider Notes (Signed)
 [place] EMERGENCY DEPARTMENT AT [facility] Provider Note   CSN: [id number] Arrival date & time: 10/30/23  1612     History  No chief complaint on file.   Kristina Osborne is a 73 y.o. female.  HPI   73 year old female with medical history significant for essential thrombocytosis, gastritis, GERD, mitral valve prolapse and recent discovery of severe mitral valve insufficiency on echocardiogram on 10/24/2023 with severe mitral valve regurgitation who presents to the emergency department with shortness of breath.  The patient called her cardiologist, Dr. Rennis Golden and stated that she was unable to lie flat and having significantly worsening dyspnea over the past few days.  She denies any lower extremity swelling.  She denies any cough, fever or chills.  She denies any active chest discomfort.  She states that she was scheduled for further evaluation via cardiac catheterization at [facility] on Thursday, March 6 however given her worsening symptoms she was unable to wait for further outpatient workup and she was advised by her cardiologist to present to the emergency department for admission for further management. She states that her HR is elevated to the 130s when standing. She feels very fatigued.  Home Medications Prior to Admission medications   Medication Sig Start Date End Date Taking? Authorizing Provider  acetaminophen (TYLENOL) 500 MG tablet Take 1,000 mg by mouth every 6 (six) hours as needed (pain.).    [provider]  Ascorbic Acid (VITAMIN C) 1000 MG tablet Take 1,000 mg by mouth in the morning.    [provider]  cholecalciferol (VITAMIN D3) 25 MCG (1000 UNIT) tablet Take 1,000 Units by mouth in the morning.    [provider]  famotidine (PEPCID) 40 MG tablet TAKE 1 TABLET(40 MG) BY MOUTH DAILY Patient taking differently: Take 40 mg by mouth at bedtime. 08/23/23   Jaci Standard, MD  folic acid (FOLVITE) 1 MG tablet Take 1 tablet (1 mg total)  by mouth daily. Patient taking differently: Take 1 mg by mouth at bedtime. 05/22/23   Jaci Standard, MD  furosemide (LASIX) 20 MG tablet Take 1 tablet (20 mg total) by mouth daily. 10/23/23   Hilty, Lisette Abu, MD  Glucosamine-Chondroitin (COSAMIN DS PO) Take 1 tablet by mouth daily.    [provider]  hydroxyurea (HYDREA) 500 MG capsule Take 1 capsule (500 mg total) by mouth 2 (two) times daily. Patient taking differently: Take 500 mg by mouth See admin instructions. Take 1 capsule (500 mg) by mouth in the morning & take 2 capsules (1000 mg) by mouth at night. 05/22/23   Jaci Standard, MD  metoprolol tartrate (LOPRESSOR) 25 MG tablet Take 1/2 tablet ( 12.5 mg ) twice a day Patient taking differently: Take 12.5 mg by mouth 2 (two) times daily as needed (palpitation.). 10/12/23   [name], MD  Multiple Vitamin (MULTIVITAMIN WITH MINERALS) TABS tablet Take 1 tablet by mouth in the morning.    [provider]  Multiple Vitamins-Minerals (PRESERVISION AREDS 2+MULTI VIT PO) Take 1 capsule by mouth 2 (two) times daily.    [provider]  sucralfate (CARAFATE) 1 g tablet TAKE 1 TABLET(1 GRAM) BY MOUTH FOUR TIMES DAILY AT BEDTIME WITH MEALS Patient taking differently: Take 1 g by mouth at bedtime. 08/31/23   Jaci Standard, MD      Allergies    Aspirin and Sulfa antibiotics    Review of Systems   Review of Systems  All other systems reviewed  and are negative.   Physical Exam Updated Vital Signs BP (!) 106/51   Pulse 96   Temp (!) 103.1 F (39.5 C) (Oral)   Resp 17   LMP  (LMP Unknown)   SpO2 98%  Physical Exam Vitals and nursing note reviewed.  Constitutional:      General: She is not in acute distress.    Appearance: She is well-developed.  HENT:     Head: Normocephalic and atraumatic.  Eyes:     Conjunctiva/sclera: Conjunctivae normal.  Cardiovascular:     Rate and Rhythm: Normal rate and regular rhythm.     Heart sounds: Murmur heard.      Systolic murmur is present.  Pulmonary:     Effort: Pulmonary effort is normal. Tachypnea present. No respiratory distress.     Breath sounds: Normal breath sounds.  Abdominal:     Palpations: Abdomen is soft.     Tenderness: There is no abdominal tenderness.  Musculoskeletal:        General: No swelling.     Cervical back: Neck supple.  Skin:    General: Skin is warm and dry.     Capillary Refill: Capillary refill takes less than 2 seconds.  Neurological:     Mental Status: She is alert.  Psychiatric:        Mood and Affect: Mood normal.     ED Results / Procedures / Treatments   Labs (all labs ordered are listed, but only abnormal results are displayed) Labs Reviewed  BASIC METABOLIC PANEL - Abnormal; Notable for the following components:      Result Value   Glucose, Bld 106 (*)    All other components within normal limits  CBC - Abnormal; Notable for the following components:   RBC 3.26 (*)    MCV 118.4 (*)    MCH 40.5 (*)    Platelets 402 (*)    All other components within normal limits  RESP PANEL BY RT-PCR (RSV, FLU A&B, COVID)  RVPGX2  BRAIN NATRIURETIC PEPTIDE  TSH  T4, FREE  TROPONIN I (HIGH SENSITIVITY)  TROPONIN I (HIGH SENSITIVITY)    EKG EKG Interpretation Date/Time:  Tuesday October 30 2023 16:19:20 EST Ventricular Rate:  98 PR Interval:  136 QRS Duration:  86 QT Interval:  336 QTC Calculation: 428 R Axis:   80  Text Interpretation: Normal sinus rhythm Possible Left atrial enlargement Nonspecific ST and T wave abnormality Abnormal ECG When compared with ECG of 17-Oct-2023 11:15, Nonspecific T wave abnormality, worse in Lateral leads Confirmed by Ernie Avena ([id number]) on 10/30/2023 5:01:19 PM  Radiology CT Angio Chest PE W and/or Wo Contrast Result Date: 10/30/2023 CLINICAL DATA:  Shortness of breath and tachycardia EXAM: CT ANGIOGRAPHY CHEST WITH CONTRAST TECHNIQUE: Multidetector CT imaging of the chest was performed using the standard protocol during  bolus administration of intravenous contrast. Multiplanar CT image reconstructions and MIPs were obtained to evaluate the vascular anatomy. RADIATION DOSE REDUCTION: This exam was performed according to the departmental dose-optimization program which includes automated exposure control, adjustment of the mA and/or kV according to patient size and/or use of iterative reconstruction technique. CONTRAST:  75mL OMNIPAQUE IOHEXOL 350 MG/ML SOLN COMPARISON:  Film from earlier in the same day. FINDINGS: Cardiovascular: Thoracic aorta shows atherosclerotic calcifications. No significant enhancement is identified. No aneurysmal dilatation is seen. The heart is at the upper limits of normal in size. Stable mitral calcifications are noted. The pulmonary artery shows a normal branching pattern bilaterally. No intraluminal filling defect  is identified to suggest pulmonary embolism. Mediastinum/Nodes: Thoracic inlet is within normal limits. No hilar or mediastinal adenopathy is noted. The esophagus as visualized is within normal limits. Lungs/Pleura: The lungs are well aerated bilaterally. No focal infiltrate or effusion is seen. No parenchymal nodules are noted. Upper Abdomen: Visualized upper abdomen shows no acute abnormality. Musculoskeletal: No acute rib abnormality is noted. Review of the MIP images confirms the above findings. IMPRESSION: No evidence of pulmonary embolism. No acute abnormality noted. Electronically Signed   By: Alcide Clever M.D.   On: 10/30/2023 20:10   DG Chest 2 View Result Date: 10/30/2023 CLINICAL DATA:  Chest pain EXAM: CHEST - 2 VIEW COMPARISON:  10/15/2023 FINDINGS: Sternotomy. No acute airspace disease or effusion. Normal cardiac size. No pneumothorax. Metallic artifact over the left chest IMPRESSION: No active cardiopulmonary disease. Electronically Signed   By: Jasmine Pang M.D.   On: 10/30/2023 19:20    Procedures Procedures    Medications Ordered in ED Medications  iohexol  (OMNIPAQUE) 350 MG/ML injection 100 mL (75 mLs Intravenous Contrast Given 10/30/23 1803)  acetaminophen (TYLENOL) tablet 650 mg (650 mg Oral Given 10/30/23 1945)    ED Course/ Medical Decision Making/ A&P                                 Medical Decision Making Amount and/or Complexity of Data Reviewed Labs: ordered. Radiology: ordered.  Risk Prescription drug management. Decision regarding hospitalization.    73 year old female with medical history significant for essential thrombocytosis, gastritis, GERD, mitral valve prolapse and recent discovery of severe mitral valve insufficiency on echocardiogram on 10/24/2023 with severe mitral valve regurgitation who presents to the emergency department with shortness of breath.  The patient called her cardiologist, Dr. Rennis Golden and stated that she was unable to lie flat and having significantly worsening dyspnea over the past few days.  She denies any lower extremity swelling.  She denies any cough, fever or chills.  She denies any active chest discomfort.  She states that she was scheduled for further evaluation via cardiac catheterization at [facility] on Thursday, March 6 however given her worsening symptoms she was unable to wait for further outpatient workup and she was advised by her cardiologist to present to the emergency department for admission for further management. She states that her HR is elevated to the 130s when standing. She feels very fatigued.  On arrival, the patient was afebrile, temperature 97.9, tachycardic heart rate 102, RSV rate 18, BP 109/71, saturating 98% on room air.  Physical exam revealed a harsh systolic murmur.  Primary concern at this time is for worsening mitral regurgitation and valvular emergency.  Additionally considered pericardial effusion.  Patient does not present with tamponade physiology.  Considered pulmonary embolism.  Considered viral infection, pleural effusion, PNA, less likely ACS, dissection.  EKG: Sinus rhythm,  ventricular rate 98, nonspecific ST changes, no STEMI  CXR: No active cardiopulmonary disease  CTA PE: Cute abnormality, no evidence of PE, no pulmonary edema or pneumonia  Labs: COVID, influenza, RSV PCR testing negative, BNP 99, TSH normal, cardiac troponins normal, BMP unremarkable, CBC without a leukocytosis or anemia  Cardiology Consult: I spoke with on-call cardiology, Dr. Bjorn Pippin who agreed this is likely worsening symptoms associated with the patient's mitral valve dysfunction.  Recommended inpatient management on the cardiology service.  Orders placed and the patient was updated regarding the plan of care.   Final Clinical Impression(s) / ED  Diagnoses Final diagnoses:  Valvular regurgitation    Rx / DC Orders ED Discharge Orders     None         Ernie Avena, MD 10/30/23 2029

## 2023-10-30 NOTE — ED Triage Notes (Signed)
 Sob and hr 130s when standing X weeks But worse in the last few days Sinus symptoms Scheduled for cardiac cath on Thursday  Patient wearing 2 weeks heart monitor

## 2023-10-30 NOTE — Telephone Encounter (Signed)
 Patient c/o Palpitations:  STAT if patient reporting lightheadedness, shortness of breath, or chest pain  How long have you had palpitations/irregular HR/ Afib? Are you having the symptoms now? Since right after eating   Are you currently experiencing lightheadedness, SOB or CP? SOB  Do you have a history of afib (atrial fibrillation) or irregular heart rhythm? PVC's, mirtal regurg, has pending cath on thurs  Have you checked your BP or HR? (document readings if available): HR 120  Are you experiencing any other symptoms? weakness

## 2023-10-31 ENCOUNTER — Inpatient Hospital Stay: Payer: Medicare Other | Attending: Hematology and Oncology

## 2023-10-31 ENCOUNTER — Ambulatory Visit: Admitting: Family Medicine

## 2023-10-31 ENCOUNTER — Other Ambulatory Visit: Payer: Self-pay

## 2023-10-31 DIAGNOSIS — I493 Ventricular premature depolarization: Secondary | ICD-10-CM | POA: Diagnosis present

## 2023-10-31 DIAGNOSIS — R059 Cough, unspecified: Secondary | ICD-10-CM | POA: Diagnosis present

## 2023-10-31 DIAGNOSIS — D473 Essential (hemorrhagic) thrombocythemia: Secondary | ICD-10-CM

## 2023-10-31 DIAGNOSIS — D509 Iron deficiency anemia, unspecified: Secondary | ICD-10-CM | POA: Diagnosis present

## 2023-10-31 DIAGNOSIS — Z886 Allergy status to analgesic agent status: Secondary | ICD-10-CM | POA: Diagnosis not present

## 2023-10-31 DIAGNOSIS — D75839 Thrombocytosis, unspecified: Secondary | ICD-10-CM | POA: Diagnosis present

## 2023-10-31 DIAGNOSIS — R058 Other specified cough: Secondary | ICD-10-CM | POA: Diagnosis not present

## 2023-10-31 DIAGNOSIS — I5032 Chronic diastolic (congestive) heart failure: Secondary | ICD-10-CM | POA: Diagnosis present

## 2023-10-31 DIAGNOSIS — R5383 Other fatigue: Secondary | ICD-10-CM | POA: Diagnosis not present

## 2023-10-31 DIAGNOSIS — I341 Nonrheumatic mitral (valve) prolapse: Secondary | ICD-10-CM | POA: Diagnosis not present

## 2023-10-31 DIAGNOSIS — E860 Dehydration: Secondary | ICD-10-CM | POA: Diagnosis present

## 2023-10-31 DIAGNOSIS — Q2730 Arteriovenous malformation, site unspecified: Secondary | ICD-10-CM | POA: Diagnosis not present

## 2023-10-31 DIAGNOSIS — I340 Nonrheumatic mitral (valve) insufficiency: Secondary | ICD-10-CM

## 2023-10-31 DIAGNOSIS — R051 Acute cough: Secondary | ICD-10-CM

## 2023-10-31 DIAGNOSIS — K5520 Angiodysplasia of colon without hemorrhage: Secondary | ICD-10-CM | POA: Diagnosis present

## 2023-10-31 DIAGNOSIS — I272 Pulmonary hypertension, unspecified: Secondary | ICD-10-CM | POA: Diagnosis present

## 2023-10-31 DIAGNOSIS — Z882 Allergy status to sulfonamides status: Secondary | ICD-10-CM | POA: Diagnosis not present

## 2023-10-31 DIAGNOSIS — K219 Gastro-esophageal reflux disease without esophagitis: Secondary | ICD-10-CM | POA: Diagnosis present

## 2023-10-31 DIAGNOSIS — Z8249 Family history of ischemic heart disease and other diseases of the circulatory system: Secondary | ICD-10-CM | POA: Diagnosis not present

## 2023-10-31 DIAGNOSIS — I38 Endocarditis, valve unspecified: Secondary | ICD-10-CM

## 2023-10-31 DIAGNOSIS — Z79899 Other long term (current) drug therapy: Secondary | ICD-10-CM | POA: Diagnosis not present

## 2023-10-31 DIAGNOSIS — R0981 Nasal congestion: Secondary | ICD-10-CM

## 2023-10-31 LAB — CBC
HCT: 36.5 % (ref 36.0–46.0)
Hemoglobin: 12.7 g/dL (ref 12.0–15.0)
MCH: 41.2 pg — ABNORMAL HIGH (ref 26.0–34.0)
MCHC: 34.8 g/dL (ref 30.0–36.0)
MCV: 118.5 fL — ABNORMAL HIGH (ref 80.0–100.0)
Platelets: 333 10*3/uL (ref 150–400)
RBC: 3.08 MIL/uL — ABNORMAL LOW (ref 3.87–5.11)
RDW: 14.8 % (ref 11.5–15.5)
WBC: 6.3 10*3/uL (ref 4.0–10.5)
nRBC: 0 % (ref 0.0–0.2)

## 2023-10-31 LAB — BASIC METABOLIC PANEL
Anion gap: 14 (ref 5–15)
BUN: 8 mg/dL (ref 8–23)
CO2: 21 mmol/L — ABNORMAL LOW (ref 22–32)
Calcium: 9.6 mg/dL (ref 8.9–10.3)
Chloride: 103 mmol/L (ref 98–111)
Creatinine, Ser: 0.66 mg/dL (ref 0.44–1.00)
GFR, Estimated: >60 mL/min (ref >60)
Glucose, Bld: 96 mg/dL (ref 70–99)
Potassium: 3.7 mmol/L (ref 3.5–5.1)
Sodium: 138 mmol/L (ref 135–145)

## 2023-10-31 MED ORDER — HYDROXYUREA 500 MG PO CAPS: 1000.0000 mg | ORAL_CAPSULE | Freq: Every day | ORAL | Status: DC

## 2023-10-31 MED ORDER — HYDROXYUREA 500 MG PO CAPS: 500.0000 mg | ORAL_CAPSULE | ORAL | Status: DC

## 2023-10-31 MED ORDER — BENZONATATE 100 MG PO CAPS
100.0000 mg | ORAL_CAPSULE | Freq: Once | ORAL | Status: AC
  Administered 2023-10-31: 100 mg via ORAL
  Filled 2023-10-31: qty 1

## 2023-10-31 MED ORDER — FUROSEMIDE 20 MG PO TABS
20.0000 mg | ORAL_TABLET | Freq: Once | ORAL | Status: AC
  Administered 2023-10-31: 20 mg via ORAL
  Filled 2023-10-31: qty 1

## 2023-10-31 MED ORDER — FAMOTIDINE 20 MG PO TABS
40.0000 mg | ORAL_TABLET | Freq: Every day | ORAL | Status: DC
  Administered 2023-10-31 – 2023-11-01 (×2): 40 mg via ORAL
  Filled 2023-10-31 (×2): qty 2

## 2023-10-31 MED ORDER — SUCRALFATE 1 G PO TABS
1.0000 g | ORAL_TABLET | Freq: Every day | ORAL | Status: DC
  Administered 2023-10-31 – 2023-11-01 (×2): 1 g via ORAL
  Filled 2023-10-31 (×2): qty 1

## 2023-10-31 MED ORDER — ACETAMINOPHEN 500 MG PO TABS
1000.0000 mg | ORAL_TABLET | Freq: Four times a day (QID) | ORAL | Status: DC | PRN
  Administered 2023-10-31 – 2023-11-02 (×2): 1000 mg via ORAL
  Filled 2023-10-31 (×2): qty 2

## 2023-10-31 MED ORDER — CHOLESTYRAMINE LIGHT 4 G PO PACK
4.0000 g | PACK | Freq: Every day | ORAL | Status: DC
  Administered 2023-11-01 – 2023-11-02 (×2): 4 g via ORAL
  Filled 2023-10-31 (×3): qty 1

## 2023-10-31 MED ORDER — DEXTROMETHORPHAN POLISTIREX ER 30 MG/5ML PO SUER
60.0000 mg | Freq: Two times a day (BID) | ORAL | Status: DC | PRN
  Administered 2023-10-31 – 2023-11-01 (×4): 60 mg via ORAL
  Filled 2023-10-31 (×5): qty 10

## 2023-10-31 MED ORDER — SODIUM CHLORIDE 0.9 % WEIGHT BASED INFUSION
3.0000 mL/kg/h | INTRAVENOUS | Status: DC
  Administered 2023-11-01: 3 mL/kg/h via INTRAVENOUS

## 2023-10-31 MED ORDER — HYDROXYUREA 500 MG PO CAPS
500.0000 mg | ORAL_CAPSULE | Freq: Every day | ORAL | Status: DC
  Administered 2023-11-01 – 2023-11-02 (×2): 500 mg via ORAL
  Filled 2023-10-31 (×2): qty 1

## 2023-10-31 MED ORDER — SODIUM CHLORIDE 0.9 % WEIGHT BASED INFUSION: 1.0000 mL/kg/h | INTRAVENOUS | Status: DC

## 2023-10-31 MED ORDER — ONDANSETRON HCL 4 MG/2ML IJ SOLN: 4.0000 mg | Freq: Four times a day (QID) | INTRAMUSCULAR | Status: DC | PRN

## 2023-10-31 MED ORDER — SODIUM CHLORIDE 0.9 % WEIGHT BASED INFUSION: 3.0000 mL/kg/h | INTRAVENOUS | Status: DC

## 2023-10-31 MED ORDER — ENOXAPARIN SODIUM 40 MG/0.4ML IJ SOSY: 40.0000 mg | PREFILLED_SYRINGE | INTRAMUSCULAR | Status: DC

## 2023-10-31 MED ORDER — HYDROXYUREA 500 MG PO CAPS
1000.0000 mg | ORAL_CAPSULE | Freq: Every day | ORAL | Status: DC
  Administered 2023-10-31 – 2023-11-01 (×2): 1000 mg via ORAL
  Filled 2023-10-31 (×3): qty 2

## 2023-10-31 MED ORDER — FOLIC ACID 1 MG PO TABS
1.0000 mg | ORAL_TABLET | Freq: Every day | ORAL | Status: DC
Start: 2023-11-01 — End: 2023-11-02
  Administered 2023-11-02: 1 mg via ORAL
  Filled 2023-10-31: qty 1

## 2023-10-31 MED ORDER — FUROSEMIDE 20 MG PO TABS: 20.0000 mg | ORAL_TABLET | Freq: Every day | ORAL | Status: DC

## 2023-10-31 MED ORDER — HYDROXYUREA 500 MG PO CAPS
500.0000 mg | ORAL_CAPSULE | Freq: Every day | ORAL | Status: DC
Start: 2023-11-01 — End: 2023-10-31

## 2023-10-31 NOTE — Progress Notes (Addendum)
 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 She had admission in 03/2022 had EGD with enteroscpy showed several freshly bleeding AVMs which were cauterized with argon plasma. Unsure of clinical significance of this and how much this should be taken into consideration about VTE prophylaxis and heart cath tomorrow (if found to have disease requiring DAPT). Prior notes do not explicitly state she has contraindications to blood thinners. Also with history of essential thrombocytosis with positive JAK2 mutation, potential plans of potential bone marrow biopsy? Please address tomorrow, for now she has declined Lovenox per nursing staff.   Would considering consulting GI and or heme for more input.

## 2023-10-31 NOTE — Progress Notes (Signed)
 Patient was really upset about getting started on Lovenox. Pt stated she cannot take  any blood thinners due to bleeding AVM in abdomen. Stated she had mentioned it to cardiology. Thamas Jaegers PA notified. Lovenox was d/ced. Plan of care continues.

## 2023-10-31 NOTE — ED Notes (Signed)
 Called Monisha at [facility] for transport

## 2023-10-31 NOTE — ED Provider Notes (Signed)
 This patient has been admitted to the hospitalist service but has remained in the [facility] awaiting an open bed at another [facility] facility.  She reports feeling well today.  She remains hemodynamically stable at this time   Royanne Foots, DO 10/31/23 1456

## 2023-10-31 NOTE — H&P (Addendum)
 Cardiology Admission History and Physical   Patient ID: Kristina Osborne MRN: [id number]; DOB: Nov 03, 1950   Admission date: 10/30/2023  PCP:  Corwin Levins, MD   [facility] Providers Cardiologist:  [name], MD   {  Chief Complaint:  fatigue/SOB, severe MR  Patient Profile:   Kristina Osborne is a 73 y.o. 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  History of Present Illness:   Kristina Osborne was seen by Dr. Rennis Golden in February 2023 noted to have some mild regurgitation and prolapse of the MV.  Lost in follow-up eventually was prompted back to follow-up when she had echocardiogram on 2/13 that showed preserved EF with moderate calcification and deformity of the posterior leaflet with moderate prolapse.  Very severely directed MR filling of the entire LA.  Severe MR.  Continued to undergo evaluation for this had TEE confirming severe flail of the posterior leaflet and degenerative MV with severe MR.  No appendage thrombus.  She is scheduled for right and left heart catheterization tomorrow.  But due to severe symptoms went to [facility]  Patient initially seen at [facility] noting significant shortness of breath and DOE.  She reports that for the last 10 days she really has had a rapid progression and shortness of breath and only able to ambulate 25 to 50 feet whereas before she could exercise and walk up hills with little to no difficulty.  However she denies any chest pain, orthopnea, palpitations, peripheral edema.  She does have significant cough with mild chest discomfort when this occurs but had negative respiratory panel.  In regards to her PVCs, reportedly had this surrounding acute illness of flu but after fluid hydration she has not had any significant recurrences and is not even taking her Lopressor at  home. Also with 2 week heart monitor that she is still wearing.   CTA negative for PE.  Negative chest x-ray.  Negative troponins x 2.  Normal TSH and T4.  Potassium 3.7.  Normal creatinine.  BNP 99.4.  Hemoglobin 13.2.  Platelets 402 (sees a hematologist).  Past Medical History:  Diagnosis Date   ABLA (acute blood loss anemia) 04/16/2022   Congenital malformation of intestinal fixation    Gastritis    Gastroesophageal reflux disease without esophagitis 09/10/2021   IDA (iron deficiency anemia)    Mitral valve prolapse    Persistent hyperplasia of thymus (HCC)    S/P Thymectomy   Thrombocytosis     Past Surgical History:  Procedure Laterality Date   BIOPSY  06/26/2022   Procedure: BIOPSY;  Surgeon: Jenel Lucks, MD;  Location: [facility];  Service: Gastroenterology;;   COLONOSCOPY N/A 04/17/2022   Procedure: COLONOSCOPY;  Surgeon: Beverley Fiedler, MD;  Location: [facility];  Service: Gastroenterology;  Laterality: N/A;   COLONOSCOPY WITH PROPOFOL N/A 06/26/2022   Procedure: COLONOSCOPY WITH PROPOFOL;  Surgeon: Jenel Lucks, MD;  Location: [facility];  Service: Gastroenterology;  Laterality: N/A;   ENTEROSCOPY N/A 04/17/2022   Procedure: ENTEROSCOPY;  Surgeon: Beverley Fiedler, MD;  Location: [facility];  Service: Gastroenterology;  Laterality: N/A;   ENTEROSCOPY N/A 06/26/2022   Procedure: ENTEROSCOPY;  Surgeon: Jenel Lucks, MD;  Location: [facility];  Service: Gastroenterology;  Laterality: N/A;   GIVENS CAPSULE STUDY N/A 06/26/2022   Procedure: GIVENS CAPSULE STUDY;  Surgeon: Jenel Lucks, MD;  Location: [facility];  Service: Gastroenterology;  Laterality: N/A;   HEMOSTASIS CLIP PLACEMENT  04/17/2022   Procedure: HEMOSTASIS CLIP PLACEMENT;  Surgeon: Beverley Fiedler, MD;  Location: [facility];  Service: Gastroenterology;;   HEMOSTASIS CLIP PLACEMENT  06/26/2022   Procedure: HEMOSTASIS CLIP PLACEMENT;  Surgeon: Jenel Lucks, MD;  Location:  [facility];  Service: Gastroenterology;;   HOT HEMOSTASIS N/A 04/17/2022   Procedure: HOT HEMOSTASIS (ARGON PLASMA COAGULATION/BICAP);  Surgeon: Beverley Fiedler, MD;  Location: [facility];  Service: Gastroenterology;  Laterality: N/A;   HOT HEMOSTASIS N/A 06/26/2022   Procedure: HOT HEMOSTASIS (ARGON PLASMA COAGULATION/BICAP);  Surgeon: Jenel Lucks, MD;  Location: [facility];  Service: Gastroenterology;  Laterality: N/A;   POLYPECTOMY  04/17/2022   Procedure: POLYPECTOMY;  Surgeon: Beverley Fiedler, MD;  Location: [facility];  Service: Gastroenterology;;   POLYPECTOMY  06/26/2022   Procedure: POLYPECTOMY;  Surgeon: Jenel Lucks, MD;  Location: [facility];  Service: Gastroenterology;;   SMALL INTESTINE SURGERY  1994   SUBMUCOSAL TATTOO INJECTION  06/26/2022   Procedure: SUBMUCOSAL TATTOO INJECTION;  Surgeon: Jenel Lucks, MD;  Location: [facility];  Service: Gastroenterology;;   THYMECTOMY N/A    TRANSESOPHAGEAL ECHOCARDIOGRAM (CATH LAB) N/A 10/24/2023   Procedure: TRANSESOPHAGEAL ECHOCARDIOGRAM;  Surgeon: [name], MD;  Location: [facility];  Service: Cardiovascular;  Laterality: N/A;     Medications Prior to Admission: Prior to Admission medications   Medication Sig Start Date End Date Taking? Authorizing Provider  acetaminophen (TYLENOL) 500 MG tablet Take 1,000 mg by mouth every 6 (six) hours as needed (pain.).   Yes [provider]  Ascorbic Acid (VITAMIN C) 1000 MG tablet Take 1,000 mg by mouth in the morning.   Yes [provider]  cholecalciferol (VITAMIN D3) 25 MCG (1000 UNIT) tablet Take 1,000 Units by mouth in the morning.   Yes [provider]  cholestyramine light (PREVALITE) 4 g packet Take 4 g by mouth daily. *1/2 packet in morning and 1/2 packet at night*   Yes [provider]  famotidine (PEPCID) 40 MG tablet TAKE 1 TABLET(40 MG) BY MOUTH DAILY Patient taking differently: Take 40 mg by mouth at  bedtime. 08/23/23  Yes Jaci Standard, MD  folic acid (FOLVITE) 1 MG tablet Take 1 tablet (1 mg total) by mouth daily. Patient taking differently: Take 1 mg by mouth at bedtime. 05/22/23  Yes Jaci Standard, MD  furosemide (LASIX) 20 MG tablet Take 1 tablet (20 mg total) by mouth daily. 10/23/23  Yes Hilty, Lisette Abu, MD  Glucosamine-Chondroitin (COSAMIN DS PO) Take 1 tablet by mouth daily.   Yes [provider]  hydroxyurea (HYDREA) 500 MG capsule Take 1 capsule (500 mg total) by mouth 2 (two) times daily. Patient taking differently: Take 500 mg by mouth See admin instructions. Take 1 capsule (500 mg) by mouth in the morning & take 2 capsules (1000 mg) by mouth at night. 05/22/23  Yes Jaci Standard, MD  Multiple Vitamin (MULTIVITAMIN WITH MINERALS) TABS tablet Take 1 tablet by mouth in the morning.   Yes [provider]  Multiple Vitamins-Minerals (PRESERVISION AREDS 2+MULTI VIT PO) Take 1 capsule by mouth 2 (two) times daily.   Yes [provider]  sucralfate (CARAFATE) 1 g tablet TAKE 1 TABLET(1 GRAM) BY MOUTH FOUR TIMES DAILY AT BEDTIME WITH MEALS Patient taking differently: Take 1 g by mouth at bedtime. 08/31/23  Yes Jaci Standard, MD  metoprolol tartrate (LOPRESSOR) 25 MG tablet Take 1/2 tablet ( 12.5 mg ) twice a day Patient taking differently: Take 12.5 mg by mouth 2 (two) times daily as needed (palpitation.). 10/12/23   Hilty, Lisette Abu, MD     Allergies:    Allergies  Allergen Reactions   Aspirin Other (See Comments)    "Due to intestinal issue"   Sulfa Antibiotics Other (See Comments)    bleeding    Social History:   Social History   Socioeconomic History   Marital status: Married    Spouse name: Not on file   Number of children: 0   Years of education: Not on file   Highest education level: Not on file  Occupational History   Not on file  Tobacco Use   Smoking status: Former    Current packs/day: 0.00    Types: Cigarettes    Quit  date: 2002    Years since quitting: 23.1   Smokeless tobacco: Never   Tobacco comments:    Started smoking in her early 45's.    Smoked 0.5PP at her heaviest.   Vaping Use   Vaping status: Never Used  Substance and Sexual Activity   Alcohol use: Not Currently   Drug use: Never   Sexual activity: Yes    Partners: Male  Other Topics Concern   Not on file  Social History Narrative   Not on file   Social Drivers of Health   Financial Resource Strain: Not on file  Food Insecurity: No Food Insecurity (06/24/2022)   Hunger Vital Sign    Worried About Running Out of Food in the Last Year: Never true    Ran Out of Food in the Last Year: Never true  Transportation Needs: No Transportation Needs (06/24/2022)   PRAPARE - Administrator, Civil Service (Medical): No    Lack of Transportation (Non-Medical): No  Physical Activity: Not on file  Stress: Not on file  Social Connections: Not on file  Intimate Partner Violence: Not At Risk (06/24/2022)   Humiliation, Afraid, Rape, and Kick questionnaire    Fear of Current or Ex-Partner: No    Emotionally Abused: No    Physically Abused: No    Sexually Abused: No    Family History:   The patient's family history includes Alcoholism in her half-sister; Healthy in her sister; Heart disease in her mother; Valvular heart disease in her mother.    ROS:  Please see the history of present illness.  All other ROS reviewed and negative.     Physical Exam/Data:   Vitals:   10/31/23 1300 10/31/23 1441 10/31/23 1626 10/31/23 1631  BP: 116/75 100/75  108/79  Pulse:  88  85  Resp: 19 19  (!) 21  Temp:  98.4 F (36.9 C)  98.6 F (37 C)  TempSrc:  Oral  Oral  SpO2: 97% 100%  98%  Weight:   64 kg   Height:   5\' 6"  (1.676 m)    No intake or output data in the 24 hours ending 10/31/23 1659    10/31/2023    4:26 PM 10/17/2023   12:14 PM 10/15/2023    8:18 PM  Last 3 Weights  Weight (lbs) 141 lb 1.5 oz 146 lb 6.4 oz 170 lb  Weight  (kg) 64 kg 66.407 kg 77.111 kg     Body mass index is 22.77 kg/m.  General:  Well nourished, well developed, in no acute distress HEENT: normal Neck: no JVD  Vascular: No carotid bruits; Distal pulses 2+ bilaterally   Cardiac:  normal S1, S2; RRR; 4 out of 6 murmur Lungs:  clear to auscultation bilaterally, no wheezing, rhonchi or rales  Abd: soft, nontender, no hepatomegaly  Ext: no edema Musculoskeletal:  No deformities, BUE and BLE strength normal and equal Skin: warm and dry  Neuro:  CNs 2-12 intact, no focal abnormalities noted Psych:  Normal affect    EKG:  NSR 98 no acute STTW changes.   Relevant CV Studies:  TEE 2/26/202 1. Left ventricular ejection fraction, by estimation, is 60 to 65%. The  left ventricle has normal function.   2. Right ventricular systolic function is normal. The right ventricular  size is normal.   3. Left atrial size was severely dilated. No left atrial/left atrial  appendage thrombus was detected.   4. Severe flail of posterior leaflet.   5. The mitral valve is degenerative. Severe mitral valve regurgitation.  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   6. The aortic valve is tricuspid. Aortic valve regurgitation is not  visualized. No aortic stenosis is present.   7. 3D performed of the mitral valve and demonstrates 3D views to  demonstrate flail posterior leaflet and eccentric MR.   Conclusion(s)/Recommendation(s): Severe MR with flail posterior mitral  leaflet that may be amenable to repair. Will refer to Dr. Leafy Ro (CT  surgery) for further evaluation. Of note, she had prior sternotomy for  thymic hyperplasia.   Echocardiogram 10/11/2023 1. Left ventricular ejection fraction, by estimation, is 60 to 65%. Left  ventricular ejection fraction by 3D volume is 62 %. The left ventricle has  normal function. The left ventricle has no regional wall motion  abnormalities. Left  ventricular diastolic   parameters are indeterminate. The average left ventricular global  longitudinal strain is -25.9 %. The global longitudinal strain is normal.   2. Right ventricular systolic function is normal. The right ventricular  size is normal.   3. Left atrial size was severely dilated.   4. The MV is markedly abnormal. There is moderate calcification and  deformity of the posterior leaflet with at least moderate prolapse. There  is very severe posteriorly directed MR filling the entire LA. The mitral  valve is abnormal. Severe mitral valve   regurgitation. No evidence of mitral stenosis. There is prolapse of  multiple scallops of the posterior leaflet of the mitral valve.   5. The aortic valve is normal in structure. Aortic valve regurgitation is  not visualized. Aortic valve sclerosis/calcification is present, without  any evidence of aortic stenosis.   6. The inferior vena cava is normal in size with greater than 50%  respiratory variability, suggesting right atrial pressure of 3 mmHg.   Conclusion(s)/Recommendation(s): Suggest TEE to further evalaute the  mitral valve.   Laboratory Data:  High Sensitivity Troponin:   Recent Labs  Lab 10/06/23 0853 10/30/23 1623 10/30/23 1822  TROPONINIHS 4 4 3       Chemistry Recent Labs  Lab 10/30/23 1623  NA 137  K 3.7  CL 101  CO2 27  GLUCOSE 106*  BUN 19  CREATININE 0.79  CALCIUM 9.7  GFRNONAA >60  ANIONGAP 9    No results for input(s): "PROT", "ALBUMIN", "AST", "ALT", "ALKPHOS", "BILITOT" in the last 168 hours. Lipids No results for input(s): "CHOL", "TRIG", "HDL", "LABVLDL", "LDLCALC", "CHOLHDL" in the last 168 hours. Hematology Recent Labs  Lab 10/30/23 1623  WBC 7.2  RBC 3.26*  HGB 13.2  HCT 38.6  MCV 118.4*  MCH 40.5*  MCHC 34.2  RDW 14.6  PLT 402*   Thyroid  Recent Labs  Lab 10/30/23 1700  TSH 1.488  FREET4 0.88   BNP Recent Labs  Lab 10/30/23 1630  BNP 99.4    DDimer No results for  input(s): "DDIMER" in the last 168 hours.   Radiology/Studies:  CT Angio Chest PE W and/or Wo Contrast Result Date: 10/30/2023 CLINICAL DATA:  Shortness of breath and tachycardia EXAM: CT ANGIOGRAPHY CHEST WITH CONTRAST TECHNIQUE: Multidetector CT imaging of the chest was performed using the standard protocol during bolus administration of intravenous contrast. Multiplanar CT image reconstructions and MIPs were obtained to evaluate the vascular anatomy. RADIATION DOSE REDUCTION: This exam was performed according to the departmental dose-optimization program which includes automated exposure control, adjustment of the mA and/or kV according to patient size and/or use of iterative reconstruction technique. CONTRAST:  75mL OMNIPAQUE IOHEXOL 350 MG/ML SOLN COMPARISON:  Film from earlier in the same day. FINDINGS: Cardiovascular: Thoracic aorta shows atherosclerotic calcifications. No significant enhancement is identified. No aneurysmal dilatation is seen. The heart is at the upper limits of normal in size. Stable mitral calcifications are noted. The pulmonary artery shows a normal branching pattern bilaterally. No intraluminal filling defect is identified to suggest pulmonary embolism. Mediastinum/Nodes: Thoracic inlet is within normal limits. No hilar or mediastinal adenopathy is noted. The esophagus as visualized is within normal limits. Lungs/Pleura: The lungs are well aerated bilaterally. No focal infiltrate or effusion is seen. No parenchymal nodules are noted. Upper Abdomen: Visualized upper abdomen shows no acute abnormality. Musculoskeletal: No acute rib abnormality is noted. Review of the MIP images confirms the above findings. IMPRESSION: No evidence of pulmonary embolism. No acute abnormality noted. Electronically Signed   By: Alcide Clever M.D.   On: 10/30/2023 20:10     Assessment and Plan:   Severe MR with MVP TEE confirming severe flail meant of posterior leaflet with degenerative MV.  Initially  had plans for right and left heart catheterization outpatient yesterday but had significant worsening shortness of breath/fatigue and came to the ER.  Will continue preoperative evaluation, eventually will need input from either structural or CT surgery about clipping versus surgical repair. Will stop her p.o. Lasix 20 mg.  Initially was on for pleural effusions but does not note here anymore. Can give prn.   Informed Consent   Shared Decision Making/Informed Consent{ The risks [stroke (1 in 1000), death (1 in 1000), kidney failure [usually temporary] (1 in 500), bleeding (1 in 200), allergic reaction [possibly serious] (1 in 200)], benefits (diagnostic support and management of coronary artery disease) and alternatives of a cardiac catheterization were discussed in detail with Kristina Osborne and she is willing to proceed.     Palpitations History of PVCs throughout all she denies this being an issue anymore.  Reports that episode was exacerbated by dehydration but essentially is all but resolved now.  No complaints and does not even take her Lopressor at home.  Will continue to hold off on this given soft blood pressures here.  Thrombocytosis She is on hydroxy 500 mg in the day, 1000 mg at night. PLT 400   Cough No clear etiology.  Imaging without any infectious pathology, negative respiratory panel.  Not likely related to volume.  Continue to monitor, still persistent could consider consulting hospitalist team  Risk Assessment/Risk Scores:   Code Status: Full Code  Severity of Illness: The appropriate  patient status for this patient is INPATIENT. Inpatient status is judged to be reasonable and necessary in order to provide the required intensity of service to ensure the patient's safety. The patient's presenting symptoms, physical exam findings, and initial radiographic and laboratory data in the context of their chronic comorbidities is felt to place them at high risk for further clinical  deterioration. Furthermore, it is not anticipated that the patient will be medically stable for discharge from the hospital within 2 midnights of admission.   * I certify that at the point of admission it is my clinical judgment that the patient will require inpatient hospital care spanning beyond 2 midnights from the point of admission due to high intensity of service, high risk for further deterioration and high frequency of surveillance required.*   For questions or updates, please contact [facility] Please consult [email or web address] for contact info under     Signed, Abagail Kitchens, PA-C  10/31/2023 4:59 PM     Patient seen and examined. Agree with assessment and plan.  Kristina Osborne is a very pleasant 73 year old female who is followed by Dr. Rennis Golden.  She has a history of mitral valve prolapse with mild MR in the past.  Recently, she has noticed increasing shortness of breath has significantly progressed over the past 10 days.  She underwent a transesophageal echo on October 24, 2023 which showed normal EF at 60 to 65%.  However, left atrium was severely dilated and there was severe flail leaflet of the posterior mitral valve resulting in severe mitral regurgitation.  There was severe late systolic prolapse of multiple scallops of the posterior leaflet.  Subsequently, the patient has noticed some increasing cough.  Remotely she had the flu.  She also has a history of essential thrombocytosis with a JAK2 mutation and has been on hydroxyurea followed by Dr. Jeanie Sewer.  Following her TEE, the patient was scheduled to undergo right and left heart cardiac catheterization tomorrow.  Due to worsening shortness of breath, she presented to the [facility] emergency room and was now transferred to [facility] with plans for cardiac catheterization tomorrow.  CTA was negative for PE.  Chest x-ray did not reveal any active cardiopulmonary disease or airspace disease or effusion.  There was no  pneumothorax.  On exam, blood pressure is 110/79.  She is in sinus rhythm with pulse in the 80s.  She has frequent cough.  There is no JVD.  Breath sounds were slightly diminished without wheezing.  Rhythm was regular with 3/6 MR murmur at the apex radiating to the axilla.  Abdomen was nontender.  There was no edema.  She was wearing compression socks.  Normal affect.  ECG sinus rhythm at 98 bpm with nonspecific CT abnormality.  BC reveals hemoglobin 13.2 hematocrit 38.6.  MCV is significantly increased at 118.4 platelets 402, improved platelet count 895 12 days ago.  Discussed the right and left heart catheterization with the patient.  Dr. Leafy Ro will need to be notified following the procedure evaluation of potential minimally invasive mitral valve repair surgery.   Lennette Bihari, MD, [facility] 10/31/2023 5:49 PM

## 2023-10-31 NOTE — Progress Notes (Signed)
 Patient arrived to 4E 23 via [facility] from [facility]. CHG completed. Connected to tele and CCMD notified. Cardiology paged. Oriented pt to room and call bell system. Call bell within reach. Plan of care continues.

## 2023-10-31 NOTE — Progress Notes (Signed)
 Informed consent signed and in the chart. Patient states she can NOT have stents because she can not go on anticoagulants. I explained that she could wait to talk to doctor before signing consent. Patient said she was going to the procedure anyway because she needed it. I explained that she would need to speak with doctor prior to right and left cardiac catheterization to address any concerns. Pt resting with call bell within reach.  Will continue to monitor.

## 2023-11-01 ENCOUNTER — Encounter (HOSPITAL_COMMUNITY)

## 2023-11-01 ENCOUNTER — Ambulatory Visit (HOSPITAL_COMMUNITY): Admission: RE | Admit: 2023-11-01 | Source: Home / Self Care | Admitting: Cardiovascular Disease

## 2023-11-01 ENCOUNTER — Telehealth: Payer: Self-pay | Admitting: *Deleted

## 2023-11-01 ENCOUNTER — Encounter (HOSPITAL_COMMUNITY): Payer: Self-pay | Admitting: Cardiovascular Disease

## 2023-11-01 ENCOUNTER — Telehealth: Payer: Self-pay | Admitting: Gastroenterology

## 2023-11-01 ENCOUNTER — Encounter (HOSPITAL_COMMUNITY)
Admission: EM | Disposition: A | Discharge status: Home / Self Care | Payer: Self-pay | Source: Home / Self Care | Attending: Cardiovascular Disease

## 2023-11-01 DIAGNOSIS — R5383 Other fatigue: Secondary | ICD-10-CM | POA: Diagnosis not present

## 2023-11-01 DIAGNOSIS — I38 Endocarditis, valve unspecified: Secondary | ICD-10-CM | POA: Diagnosis not present

## 2023-11-01 DIAGNOSIS — Z01812 Encounter for preprocedural laboratory examination: Secondary | ICD-10-CM

## 2023-11-01 DIAGNOSIS — I341 Nonrheumatic mitral (valve) prolapse: Secondary | ICD-10-CM | POA: Diagnosis not present

## 2023-11-01 DIAGNOSIS — I340 Nonrheumatic mitral (valve) insufficiency: Secondary | ICD-10-CM | POA: Diagnosis not present

## 2023-11-01 DIAGNOSIS — Q2730 Arteriovenous malformation, site unspecified: Secondary | ICD-10-CM | POA: Diagnosis not present

## 2023-11-01 HISTORY — PX: RIGHT/LEFT HEART CATH AND CORONARY ANGIOGRAPHY: CATH118266

## 2023-11-01 LAB — POCT I-STAT 7, (LYTES, BLD GAS, ICA,H+H)
Acid-Base Excess: 0 mmol/L (ref 0.0–2.0)
Bicarbonate: 24.2 mmol/L (ref 20.0–28.0)
Calcium, Ion: 1.28 mmol/L (ref 1.15–1.40)
HCT: 37 % (ref 36.0–46.0)
Hemoglobin: 12.6 g/dL (ref 12.0–15.0)
O2 Saturation: 92 %
Potassium: 3.6 mmol/L (ref 3.5–5.1)
Sodium: 140 mmol/L (ref 135–145)
TCO2: 25 mmol/L (ref 22–32)
pCO2 arterial: 38.9 mmHg (ref 32–48)
pH, Arterial: 7.401 (ref 7.35–7.45)
pO2, Arterial: 62 mmHg — ABNORMAL LOW (ref 83–108)

## 2023-11-01 LAB — CBC
HCT: 37.2 % (ref 36.0–46.0)
Hemoglobin: 12.7 g/dL (ref 12.0–15.0)
MCH: 40.6 pg — ABNORMAL HIGH (ref 26.0–34.0)
MCHC: 34.1 g/dL (ref 30.0–36.0)
MCV: 118.8 fL — ABNORMAL HIGH (ref 80.0–100.0)
Platelets: 309 10*3/uL (ref 150–400)
RBC: 3.13 MIL/uL — ABNORMAL LOW (ref 3.87–5.11)
RDW: 14.6 % (ref 11.5–15.5)
WBC: 6.8 10*3/uL (ref 4.0–10.5)
nRBC: 0 % (ref 0.0–0.2)

## 2023-11-01 LAB — BASIC METABOLIC PANEL
Anion gap: 4 — ABNORMAL LOW (ref 5–15)
BUN: 13 mg/dL (ref 8–23)
CO2: 25 mmol/L (ref 22–32)
Calcium: 9.2 mg/dL (ref 8.9–10.3)
Chloride: 106 mmol/L (ref 98–111)
Creatinine, Ser: 0.78 mg/dL (ref 0.44–1.00)
GFR, Estimated: >60 mL/min (ref >60)
Glucose, Bld: 104 mg/dL — ABNORMAL HIGH (ref 70–99)
Potassium: 4.1 mmol/L (ref 3.5–5.1)
Sodium: 135 mmol/L (ref 135–145)

## 2023-11-01 LAB — POCT I-STAT EG7
Acid-Base Excess: 0 mmol/L (ref 0.0–2.0)
Bicarbonate: 25.3 mmol/L (ref 20.0–28.0)
Calcium, Ion: 1.29 mmol/L (ref 1.15–1.40)
HCT: 36 % (ref 36.0–46.0)
Hemoglobin: 12.2 g/dL (ref 12.0–15.0)
O2 Saturation: 73 %
Potassium: 3.6 mmol/L (ref 3.5–5.1)
Sodium: 140 mmol/L (ref 135–145)
TCO2: 27 mmol/L (ref 22–32)
pCO2, Ven: 41.1 mmHg — ABNORMAL LOW (ref 44–60)
pH, Ven: 7.397 (ref 7.25–7.43)
pO2, Ven: 39 mmHg (ref 32–45)

## 2023-11-01 SURGERY — RIGHT/LEFT HEART CATH AND CORONARY ANGIOGRAPHY
Anesthesia: LOCAL

## 2023-11-01 MED ORDER — LIDOCAINE HCL (PF) 1 % IJ SOLN
INTRAMUSCULAR | Status: DC | PRN
  Administered 2023-11-01: 15 mL

## 2023-11-01 MED ORDER — SODIUM CHLORIDE 0.9 % IV SOLN: 250.0000 mL | INTRAVENOUS | Status: AC | PRN

## 2023-11-01 MED ORDER — HEPARIN (PORCINE) IN NACL 1000-0.9 UT/500ML-% IV SOLN
INTRAVENOUS | Status: DC | PRN
  Administered 2023-11-01: 500 mL
  Administered 2023-11-01: 500 mL via INTRA_ARTERIAL

## 2023-11-01 MED ORDER — MIDAZOLAM HCL 2 MG/2ML IJ SOLN
INTRAMUSCULAR | Status: AC
  Filled 2023-11-01: qty 2

## 2023-11-01 MED ORDER — SODIUM CHLORIDE 0.9% FLUSH: 3.0000 mL | INTRAVENOUS | Status: DC | PRN

## 2023-11-01 MED ORDER — FENTANYL CITRATE (PF) 100 MCG/2ML IJ SOLN
INTRAMUSCULAR | Status: DC | PRN
  Administered 2023-11-01: 25 ug via INTRAVENOUS

## 2023-11-01 MED ORDER — HEPARIN SODIUM (PORCINE) 1000 UNIT/ML IJ SOLN
INTRAMUSCULAR | Status: DC | PRN
  Administered 2023-11-01: 2000 [IU] via INTRAVENOUS

## 2023-11-01 MED ORDER — FENTANYL CITRATE (PF) 100 MCG/2ML IJ SOLN
INTRAMUSCULAR | Status: AC
  Filled 2023-11-01: qty 2

## 2023-11-01 MED ORDER — MIDAZOLAM HCL 2 MG/2ML IJ SOLN
INTRAMUSCULAR | Status: DC | PRN
  Administered 2023-11-01: 1 mg via INTRAVENOUS

## 2023-11-01 MED ORDER — VERAPAMIL HCL 2.5 MG/ML IV SOLN
INTRAVENOUS | Status: DC | PRN
  Administered 2023-11-01: 10 mL via INTRA_ARTERIAL

## 2023-11-01 MED ORDER — FUROSEMIDE 10 MG/ML IJ SOLN
20.0000 mg | Freq: Two times a day (BID) | INTRAMUSCULAR | Status: DC
  Administered 2023-11-01 – 2023-11-02 (×2): 20 mg via INTRAVENOUS
  Filled 2023-11-01 (×2): qty 2

## 2023-11-01 MED ORDER — SODIUM CHLORIDE 0.9% FLUSH
3.0000 mL | Freq: Two times a day (BID) | INTRAVENOUS | Status: DC
  Administered 2023-11-01 – 2023-11-02 (×3): 3 mL via INTRAVENOUS

## 2023-11-01 MED ORDER — LIDOCAINE HCL (PF) 1 % IJ SOLN
INTRAMUSCULAR | Status: AC
  Filled 2023-11-01: qty 30

## 2023-11-01 MED ORDER — VERAPAMIL HCL 2.5 MG/ML IV SOLN
INTRAVENOUS | Status: AC
  Filled 2023-11-01: qty 2

## 2023-11-01 MED ORDER — HEPARIN SODIUM (PORCINE) 1000 UNIT/ML IJ SOLN
INTRAMUSCULAR | Status: AC
  Filled 2023-11-01: qty 10

## 2023-11-01 MED ORDER — IOHEXOL 350 MG/ML SOLN
INTRAVENOUS | Status: DC | PRN
  Administered 2023-11-01: 18 mL

## 2023-11-01 SURGICAL SUPPLY — 9 items
CATH BALLN WEDGE 5F 110CM (CATHETERS) IMPLANT
CATH INFINITI AMBI 5FR JK (CATHETERS) IMPLANT
DEVICE RAD COMP TR BAND LRG (VASCULAR PRODUCTS) IMPLANT
GLIDESHEATH SLEND SS 6F .021 (SHEATH) IMPLANT
GUIDEWIRE INQWIRE 1.5J.035X260 (WIRE) IMPLANT
INQWIRE 1.5J .035X260CM (WIRE) ×1 IMPLANT
PACK CARDIAC CATHETERIZATION (CUSTOM PROCEDURE TRAY) ×1 IMPLANT
SET ATX-X65L (MISCELLANEOUS) IMPLANT
SHEATH GLIDE SLENDER 4/5FR (SHEATH) IMPLANT

## 2023-11-01 NOTE — Telephone Encounter (Signed)
 PT is calling to confirm that she wants to have Dr. Rhea Belton take over her care since Dr. Russella Dar has retired. She is requesting to speak to a nurse regarding if she needs to take an anti-coagulant for her cardio catheterization. She has been having valvular regurgitation and feels its gonna make things worse. Please  advise.

## 2023-11-01 NOTE — Telephone Encounter (Signed)
 The Kristina Osborne has been advised that I will notate in her chart that she prefers to see Dr Rhea Belton.  She has also been advised that she needs to speak with cardiology regarding her other concerns. The Kristina Osborne has been advised of the information and verbalized understanding.

## 2023-11-01 NOTE — H&P (View-Only) (Signed)
 Rounding Note    Patient Name: Kristina Osborne Date of Encounter: 11/01/2023  [facility] Cardiologist: [name], MD   Subjective   She notes improvement in SOB. Still present with exertion or ambulating in room but resolved at rest. She continue to have cough with clear phlegm. She is concerned about having to take antiplatelets or anticoagulant during this admission and post procedure. She has extensive history of repeat GI bleeding with last occurrence in 2023. She called her GI doctor last night who recommended that benefit outweighs the risk. She is willing to get stent if needed but would prefer to avoid antiplatelets and anticoagulation if possible.  Inpatient Medications    Scheduled Meds:  cholestyramine light  4 g Oral Daily   famotidine  40 mg Oral QHS   folic acid  1 mg Oral Daily   hydroxyurea  500 mg Oral Daily   And   hydroxyurea  1,000 mg Oral QHS   sucralfate  1 g Oral QHS   Continuous Infusions:  sodium chloride 1 mL/kg/hr (11/01/23 0705)   PRN Meds: acetaminophen, dextromethorphan, ondansetron (ZOFRAN) IV   Vital Signs    Vitals:   10/31/23 2336 10/31/23 2338 11/01/23 0336 11/01/23 0903  BP: (!) 126/110 107/66 (!) 93/51 105/63  Pulse: 80 92 80 86  Resp: 20 20 20 18   Temp: 98.5 F (36.9 C)  98.7 F (37.1 C) (!) 97.4 F (36.3 C)  TempSrc: Oral  Oral Oral  SpO2: 98% 99% 98% 99%  Weight:      Height:        Intake/Output Summary (Last 24 hours) at 11/01/2023 0932 Last data filed at 10/31/2023 2200 Gross per 24 hour  Intake 240 ml  Output --  Net 240 ml      10/31/2023    4:26 PM 10/17/2023   12:14 PM 10/15/2023    8:18 PM  Last 3 Weights  Weight (lbs) 141 lb 1.5 oz 146 lb 6.4 oz 170 lb  Weight (kg) 64 kg 66.407 kg 77.111 kg      Telemetry    NSR 70-80's - Personally Reviewed  ECG    None new - Personally Reviewed  Physical Exam   GEN: Sitting on the side of bed without no acute distress.   Neck: No JVD Cardiac:  RRR, rubs, or gallops; +2 radial pulse; 4/6 MR radiating to axilla  Respiratory: Clear to auscultation bilaterally. GI: Soft, nontender, non-distended  MS: No edema; No deformity. Neuro:  Nonfocal  Psych: Normal affect   Labs    High Sensitivity Troponin:   Recent Labs  Lab 10/06/23 0853 10/30/23 1623 10/30/23 1822  TROPONINIHS 4 4 3      Chemistry Recent Labs  Lab 10/30/23 1623 10/31/23 1812 11/01/23 0333  NA 137 138 135  K 3.7 3.7 4.1  CL 101 103 106  CO2 27 21* 25  GLUCOSE 106* 96 104*  BUN 19 8 13   CREATININE 0.79 0.66 0.78  CALCIUM 9.7 9.6 9.2  GFRNONAA >60 >60 >60  ANIONGAP 9 14 4*    Lipids No results for input(s): "CHOL", "TRIG", "HDL", "LABVLDL", "LDLCALC", "CHOLHDL" in the last 168 hours.  Hematology Recent Labs  Lab 10/30/23 1623 10/31/23 1812 11/01/23 0333  WBC 7.2 6.3 6.8  RBC 3.26* 3.08* 3.13*  HGB 13.2 12.7 12.7  HCT 38.6 36.5 37.2  MCV 118.4* 118.5* 118.8*  MCH 40.5* 41.2* 40.6*  MCHC 34.2 34.8 34.1  RDW 14.6 14.8 14.6  PLT 402* 333  309   Thyroid  Recent Labs  Lab 10/30/23 1700  TSH 1.488  FREET4 0.88    BNP Recent Labs  Lab 10/30/23 1630  BNP 99.4    DDimer No results for input(s): "DDIMER" in the last 168 hours.   Radiology    CT Angio Chest PE W and/or Wo Contrast Result Date: 10/30/2023 CLINICAL DATA:  Shortness of breath and tachycardia EXAM: CT ANGIOGRAPHY CHEST WITH CONTRAST TECHNIQUE: Multidetector CT imaging of the chest was performed using the standard protocol during bolus administration of intravenous contrast. Multiplanar CT image reconstructions and MIPs were obtained to evaluate the vascular anatomy. RADIATION DOSE REDUCTION: This exam was performed according to the departmental dose-optimization program which includes automated exposure control, adjustment of the mA and/or kV according to patient size and/or use of iterative reconstruction technique. CONTRAST:  75mL OMNIPAQUE IOHEXOL 350 MG/ML SOLN COMPARISON:  Film  from earlier in the same day. FINDINGS: Cardiovascular: Thoracic aorta shows atherosclerotic calcifications. No significant enhancement is identified. No aneurysmal dilatation is seen. The heart is at the upper limits of normal in size. Stable mitral calcifications are noted. The pulmonary artery shows a normal branching pattern bilaterally. No intraluminal filling defect is identified to suggest pulmonary embolism. Mediastinum/Nodes: Thoracic inlet is within normal limits. No hilar or mediastinal adenopathy is noted. The esophagus as visualized is within normal limits. Lungs/Pleura: The lungs are well aerated bilaterally. No focal infiltrate or effusion is seen. No parenchymal nodules are noted. Upper Abdomen: Visualized upper abdomen shows no acute abnormality. Musculoskeletal: No acute rib abnormality is noted. Review of the MIP images confirms the above findings. IMPRESSION: No evidence of pulmonary embolism. No acute abnormality noted. Electronically Signed   By: Alcide Clever M.D.   On: 10/30/2023 20:10   DG Chest 2 View Result Date: 10/30/2023 CLINICAL DATA:  Chest pain EXAM: CHEST - 2 VIEW COMPARISON:  10/15/2023 FINDINGS: Sternotomy. No acute airspace disease or effusion. Normal cardiac size. No pneumothorax. Metallic artifact over the left chest IMPRESSION: No active cardiopulmonary disease. Electronically Signed   By: Jasmine Pang M.D.   On: 10/30/2023 19:20    Cardiac Studies   TEE 10/24/2023 1. Left ventricular ejection fraction, by estimation, is 60 to 65%. The  left ventricle has normal function.   2. Right ventricular systolic function is normal. The right ventricular  size is normal.   3. Left atrial size was severely dilated. No left atrial/left atrial  appendage thrombus was detected.   4. Severe flail of posterior leaflet.   5. The mitral valve is degenerative. Severe mitral valve regurgitation.  There is severe late systolic prolapse of multiple scallops of the  posterior leaflet  of the mitral valve.   6. The aortic valve is tricuspid. Aortic valve regurgitation is not  visualized. No aortic stenosis is present.   7. 3D performed of the mitral valve and demonstrates 3D views to  demonstrate flail posterior leaflet and eccentric MR.   Conclusion(s)/Recommendation(s): Severe MR with flail posterior mitral  leaflet that may be amenable to repair. Will refer to Dr. Leafy Ro (CT  surgery) for further evaluation. Of note, she had prior sternotomy for  thymic hyperplasia.    Echocardiogram 10/11/2023 1. Left ventricular ejection fraction, by estimation, is 60 to 65%. Left  ventricular ejection fraction by 3D volume is 62 %. The left ventricle has  normal function. The left ventricle has no regional wall motion  abnormalities. Left ventricular diastolic   parameters are indeterminate. The average left ventricular global  longitudinal strain is -25.9 %. The global longitudinal strain is normal.   2. Right ventricular systolic function is normal. The right ventricular  size is normal.   3. Left atrial size was severely dilated.   4. The MV is markedly abnormal. There is moderate calcification and  deformity of the posterior leaflet with at least moderate prolapse. There  is very severe posteriorly directed MR filling the entire LA. The mitral  valve is abnormal. Severe mitral valve   regurgitation. No evidence of mitral stenosis. There is prolapse of  multiple scallops of the posterior leaflet of the mitral valve.   5. The aortic valve is normal in structure. Aortic valve regurgitation is  not visualized. Aortic valve sclerosis/calcification is present, without  any evidence of aortic stenosis.   6. The inferior vena cava is normal in size with greater than 50%  respiratory variability, suggesting right atrial pressure of 3 mmHg.   Conclusion(s)/Recommendation(s): Suggest TEE to further evalaute the  mitral valve.   Patient Profile     73 y.o. 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 LVEF preserved 60-60%. LA severely dilated.  - Scheduled for [facility] on 10/31/23 outpatient but presented to [facility] with significant worsening SOB and fatigue  then transferred to [facility] for plans with cardiac cath today.  - Negative CTA for PE, CXR negative  - d/c po Lasix 20 mg yesterday.  She was taken for pleural effusion which has resolved. Can give prn  - this am, improvement in SOB. Still present with exertion or ambulating in room but resolved at rest. - Very concerned about repeat GI bleed with the use of antiplatelets or anticoagulants. Last GI bleed in 2023 and has remained stable since then.  - Discussed that cath can not be completed without heparin in order to avoid stroke.  - She understands the importance of medication compliance if stent is indicated.  - She remains agreeable to cath.  - Plan for [facility] today. Once resulted, awaiting CT surgery input for MR repair or surgery.   Palpitations - History of PVCs but denies any issues at this moment - Reported exacerbated by dehydration has resolved now - She prefers to not take Lopressor at home  - Continue to hold off given soft blood pressure  Cough - no clear etiology  - Imaging without any infectious pathology, negative respiratory panel.  - Not likely related to volume.  - Continue to monitor, still persistent  - this am, reported still persistent with clear phlegm.  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  - denied Lovenox since she cannot be on blood thinner  with AVM history  Essential thrombocytosis with positive JAK2 mutation - currently on hydroxyurea followed by Dr. Jeanie Sewer.   For questions or updates, please contact [facility] Please consult [email or web address] for contact info under        Signed, Basilio Cairo, PA-C  11/01/2023, 9:32 AM     Patient seen and examined. Agree with assessment and plan.  Patient is anxiously awaiting her right and left  heart cardiac catheterization today.  Cough has improved with treatment last evening.  Patient has a history of AVMs, and is concerned about potential need for anticoagulation.  Rhythm currently stable without ectopy presently.  Will most likely need surgical consultation with Dr. Leafy Ro following the right and left heart cardiac catheterization procedure   Lennette Bihari, MD, [facility] 11/01/2023 11:44 AM

## 2023-11-01 NOTE — Telephone Encounter (Signed)
 Received call from pt. She is calling because she is currently hospitalized for cardiac reasons. She is to have a cardiac cath today and has been told she may need to be on blood thinners. She is concerned because she has GI AVM's and is worried that they will bleed while she is on blood thinners. Advised to discuss the pros and cons with both her cardiologist and her GI doctors and follow their recommendations. Advised that we will also follow her more closely should she need IV iron etc.  Pt voiced understanding. She stated she will call tomorrow after she has the cardiac cath to let me know what has been decided.

## 2023-11-01 NOTE — Progress Notes (Addendum)
 Rounding Note    Patient Name: Kristina Osborne Date of Encounter: 11/01/2023  [facility] Cardiologist: [name], MD   Subjective   She notes improvement in SOB. Still present with exertion or ambulating in room but resolved at rest. She continue to have cough with clear phlegm. She is concerned about having to take antiplatelets or anticoagulant during this admission and post procedure. She has extensive history of repeat GI bleeding with last occurrence in 2023. She called her GI doctor last night who recommended that benefit outweighs the risk. She is willing to get stent if needed but would prefer to avoid antiplatelets and anticoagulation if possible.  Inpatient Medications    Scheduled Meds:  cholestyramine light  4 g Oral Daily   famotidine  40 mg Oral QHS   folic acid  1 mg Oral Daily   hydroxyurea  500 mg Oral Daily   And   hydroxyurea  1,000 mg Oral QHS   sucralfate  1 g Oral QHS   Continuous Infusions:  sodium chloride 1 mL/kg/hr (11/01/23 0705)   PRN Meds: acetaminophen, dextromethorphan, ondansetron (ZOFRAN) IV   Vital Signs    Vitals:   10/31/23 2336 10/31/23 2338 11/01/23 0336 11/01/23 0903  BP: (!) 126/110 107/66 (!) 93/51 105/63  Pulse: 80 92 80 86  Resp: 20 20 20 18   Temp: 98.5 F (36.9 C)  98.7 F (37.1 C) (!) 97.4 F (36.3 C)  TempSrc: Oral  Oral Oral  SpO2: 98% 99% 98% 99%  Weight:      Height:        Intake/Output Summary (Last 24 hours) at 11/01/2023 0932 Last data filed at 10/31/2023 2200 Gross per 24 hour  Intake 240 ml  Output --  Net 240 ml      10/31/2023    4:26 PM 10/17/2023   12:14 PM 10/15/2023    8:18 PM  Last 3 Weights  Weight (lbs) 141 lb 1.5 oz 146 lb 6.4 oz 170 lb  Weight (kg) 64 kg 66.407 kg 77.111 kg      Telemetry    NSR 70-80's - Personally Reviewed  ECG    None new - Personally Reviewed  Physical Exam   GEN: Sitting on the side of bed without no acute distress.   Neck: No JVD Cardiac:  RRR, rubs, or gallops; +2 radial pulse; 4/6 MR radiating to axilla  Respiratory: Clear to auscultation bilaterally. GI: Soft, nontender, non-distended  MS: No edema; No deformity. Neuro:  Nonfocal  Psych: Normal affect   Labs    High Sensitivity Troponin:   Recent Labs  Lab 10/06/23 0853 10/30/23 1623 10/30/23 1822  TROPONINIHS 4 4 3      Chemistry Recent Labs  Lab 10/30/23 1623 10/31/23 1812 11/01/23 0333  NA 137 138 135  K 3.7 3.7 4.1  CL 101 103 106  CO2 27 21* 25  GLUCOSE 106* 96 104*  BUN 19 8 13   CREATININE 0.79 0.66 0.78  CALCIUM 9.7 9.6 9.2  GFRNONAA >60 >60 >60  ANIONGAP 9 14 4*    Lipids No results for input(s): "CHOL", "TRIG", "HDL", "LABVLDL", "LDLCALC", "CHOLHDL" in the last 168 hours.  Hematology Recent Labs  Lab 10/30/23 1623 10/31/23 1812 11/01/23 0333  WBC 7.2 6.3 6.8  RBC 3.26* 3.08* 3.13*  HGB 13.2 12.7 12.7  HCT 38.6 36.5 37.2  MCV 118.4* 118.5* 118.8*  MCH 40.5* 41.2* 40.6*  MCHC 34.2 34.8 34.1  RDW 14.6 14.8 14.6  PLT 402* 333  309   Thyroid  Recent Labs  Lab 10/30/23 1700  TSH 1.488  FREET4 0.88    BNP Recent Labs  Lab 10/30/23 1630  BNP 99.4    DDimer No results for input(s): "DDIMER" in the last 168 hours.   Radiology    CT Angio Chest PE W and/or Wo Contrast Result Date: 10/30/2023 CLINICAL DATA:  Shortness of breath and tachycardia EXAM: CT ANGIOGRAPHY CHEST WITH CONTRAST TECHNIQUE: Multidetector CT imaging of the chest was performed using the standard protocol during bolus administration of intravenous contrast. Multiplanar CT image reconstructions and MIPs were obtained to evaluate the vascular anatomy. RADIATION DOSE REDUCTION: This exam was performed according to the departmental dose-optimization program which includes automated exposure control, adjustment of the mA and/or kV according to patient size and/or use of iterative reconstruction technique. CONTRAST:  75mL OMNIPAQUE IOHEXOL 350 MG/ML SOLN COMPARISON:  Film  from earlier in the same day. FINDINGS: Cardiovascular: Thoracic aorta shows atherosclerotic calcifications. No significant enhancement is identified. No aneurysmal dilatation is seen. The heart is at the upper limits of normal in size. Stable mitral calcifications are noted. The pulmonary artery shows a normal branching pattern bilaterally. No intraluminal filling defect is identified to suggest pulmonary embolism. Mediastinum/Nodes: Thoracic inlet is within normal limits. No hilar or mediastinal adenopathy is noted. The esophagus as visualized is within normal limits. Lungs/Pleura: The lungs are well aerated bilaterally. No focal infiltrate or effusion is seen. No parenchymal nodules are noted. Upper Abdomen: Visualized upper abdomen shows no acute abnormality. Musculoskeletal: No acute rib abnormality is noted. Review of the MIP images confirms the above findings. IMPRESSION: No evidence of pulmonary embolism. No acute abnormality noted. Electronically Signed   By: Alcide Clever M.D.   On: 10/30/2023 20:10   DG Chest 2 View Result Date: 10/30/2023 CLINICAL DATA:  Chest pain EXAM: CHEST - 2 VIEW COMPARISON:  10/15/2023 FINDINGS: Sternotomy. No acute airspace disease or effusion. Normal cardiac size. No pneumothorax. Metallic artifact over the left chest IMPRESSION: No active cardiopulmonary disease. Electronically Signed   By: Jasmine Pang M.D.   On: 10/30/2023 19:20    Cardiac Studies   TEE 10/24/2023 1. Left ventricular ejection fraction, by estimation, is 60 to 65%. The  left ventricle has normal function.   2. Right ventricular systolic function is normal. The right ventricular  size is normal.   3. Left atrial size was severely dilated. No left atrial/left atrial  appendage thrombus was detected.   4. Severe flail of posterior leaflet.   5. The mitral valve is degenerative. Severe mitral valve regurgitation.  There is severe late systolic prolapse of multiple scallops of the  posterior leaflet  of the mitral valve.   6. The aortic valve is tricuspid. Aortic valve regurgitation is not  visualized. No aortic stenosis is present.   7. 3D performed of the mitral valve and demonstrates 3D views to  demonstrate flail posterior leaflet and eccentric MR.   Conclusion(s)/Recommendation(s): Severe MR with flail posterior mitral  leaflet that may be amenable to repair. Will refer to Dr. Leafy Ro (CT  surgery) for further evaluation. Of note, she had prior sternotomy for  thymic hyperplasia.    Echocardiogram 10/11/2023 1. Left ventricular ejection fraction, by estimation, is 60 to 65%. Left  ventricular ejection fraction by 3D volume is 62 %. The left ventricle has  normal function. The left ventricle has no regional wall motion  abnormalities. Left ventricular diastolic   parameters are indeterminate. The average left ventricular global  longitudinal strain is -25.9 %. The global longitudinal strain is normal.   2. Right ventricular systolic function is normal. The right ventricular  size is normal.   3. Left atrial size was severely dilated.   4. The MV is markedly abnormal. There is moderate calcification and  deformity of the posterior leaflet with at least moderate prolapse. There  is very severe posteriorly directed MR filling the entire LA. The mitral  valve is abnormal. Severe mitral valve   regurgitation. No evidence of mitral stenosis. There is prolapse of  multiple scallops of the posterior leaflet of the mitral valve.   5. The aortic valve is normal in structure. Aortic valve regurgitation is  not visualized. Aortic valve sclerosis/calcification is present, without  any evidence of aortic stenosis.   6. The inferior vena cava is normal in size with greater than 50%  respiratory variability, suggesting right atrial pressure of 3 mmHg.   Conclusion(s)/Recommendation(s): Suggest TEE to further evalaute the  mitral valve.   Patient Profile     73 y.o. 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 LVEF preserved 60-60%. LA severely dilated.  - Scheduled for [facility] on 10/31/23 outpatient but presented to [facility] with significant worsening SOB and fatigue  then transferred to [facility] for plans with cardiac cath today.  - Negative CTA for PE, CXR negative  - d/c po Lasix 20 mg yesterday.  She was taken for pleural effusion which has resolved. Can give prn  - this am, improvement in SOB. Still present with exertion or ambulating in room but resolved at rest. - Very concerned about repeat GI bleed with the use of antiplatelets or anticoagulants. Last GI bleed in 2023 and has remained stable since then.  - Discussed that cath can not be completed without heparin in order to avoid stroke.  - She understands the importance of medication compliance if stent is indicated.  - She remains agreeable to cath.  - Plan for [facility] today. Once resulted, awaiting CT surgery input for MR repair or surgery.   Palpitations - History of PVCs but denies any issues at this moment - Reported exacerbated by dehydration has resolved now - She prefers to not take Lopressor at home  - Continue to hold off given soft blood pressure  Cough - no clear etiology  - Imaging without any infectious pathology, negative respiratory panel.  - Not likely related to volume.  - Continue to monitor, still persistent  - this am, reported still persistent with clear phlegm.  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  - denied Lovenox since she cannot be on blood thinner  with AVM history  Essential thrombocytosis with positive JAK2 mutation - currently on hydroxyurea followed by Dr. Jeanie Sewer.   For questions or updates, please contact [facility] Please consult [email or web address] for contact info under        Signed, Basilio Cairo, PA-C  11/01/2023, 9:32 AM     Patient seen and examined. Agree with assessment and plan.  Patient is anxiously awaiting her right and left  heart cardiac catheterization today.  Cough has improved with treatment last evening.  Patient has a history of AVMs, and is concerned about potential need for anticoagulation.  Rhythm currently stable without ectopy presently.  Will most likely need surgical consultation with Dr. Leafy Ro following the right and left heart cardiac catheterization procedure   Lennette Bihari, MD, [facility] 11/01/2023 11:44 AM

## 2023-11-01 NOTE — Consult Note (Signed)
 [address]       [place] [address]             [phone]        Kristina Osborne [facility] Medical Record #[id number] Date of Birth: [birth year]  Referring: Tresa Endo Primary Care: Corwin Levins, MD Primary Cardiologist:Kenneth Alona Bene, MD  Chief Complaint:   Mitral Regurgitation  History of Present Illness:      Kristina Osborne is a 73 yo female with history of essential thrombocytosis, GERD, Mitral valve prolapse and severe mitral regurgitation.  This was recently diagnosed on 10/24/2023.  She presented to the Emergency Department on 10/30/2023 with complaints of shortness of breath, inability to lay flat, and elevated HR.  She contacted her primary cardiologist Dr. Rennis Golden who recommended she present to the ED for evaluation.  Workup in the ED showed no evidence of edema.  She was noted to have elevated HR of 130s with standing.  She overall complained of being fatigued.  She was afebrile, BP was normal, exam had a murmur which was not unexpected.  EKG showed NSR without ST changes.  CTA obtained showed no evidence of PE or pleural effusion.  No evidence of acute heart failure. Cardiology was consulted who felt the patients symptoms could be related to worsening mitral valve regurgitation.  Cardiac catheterization was performed and showed no evidence of obstructive CAD.  Overall she tries to very active at home.  She aims for 8000 steps per day and use weights.  However, she notices that she has been unable to do this as easily as before.  She states she is more short of breath than normal trying to do her normal activities.  She is also more fatigued than normal.  She is a former smoker quitting about 20 years ago of less than pack per day.  She does not drink.  She has a previous sternotomy for Thymectomy in her 65s.  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  She is very apprehensive about receiving any sort of blood thinners with her previous GI bleeding  issues.  She denies chest pain but does experience palpitations.  Current Activity/ Functional Status: Patient is independent with mobility/ambulation, transfers, ADL's, IADL's.   Past Medical History:  Diagnosis Date   ABLA (acute blood loss anemia) 04/16/2022   Congenital malformation of intestinal fixation    Gastritis    Gastroesophageal reflux disease without esophagitis 09/10/2021   IDA (iron deficiency anemia)    Mitral valve prolapse    Persistent hyperplasia of thymus (HCC)    S/P Thymectomy   Thrombocytosis    Past Surgical History:  Procedure Laterality Date   BIOPSY  06/26/2022   Procedure: BIOPSY;  Surgeon: Jenel Lucks, MD;  Location: [facility];  Service: Gastroenterology;;   COLONOSCOPY N/A 04/17/2022   Procedure: COLONOSCOPY;  Surgeon: Beverley Fiedler, MD;  Location: [facility];  Service: Gastroenterology;  Laterality: N/A;   COLONOSCOPY WITH PROPOFOL N/A 06/26/2022   Procedure: COLONOSCOPY WITH PROPOFOL;  Surgeon: Jenel Lucks, MD;  Location: [facility];  Service: Gastroenterology;  Laterality: N/A;   ENTEROSCOPY N/A 04/17/2022   Procedure: ENTEROSCOPY;  Surgeon: Beverley Fiedler, MD;  Location: [facility];  Service: Gastroenterology;  Laterality: N/A;   ENTEROSCOPY N/A 06/26/2022   Procedure: ENTEROSCOPY;  Surgeon: Jenel Lucks, MD;  Location: [facility];  Service: Gastroenterology;  Laterality: N/A;   GIVENS CAPSULE STUDY N/A 06/26/2022   Procedure: GIVENS CAPSULE STUDY;  Surgeon: Jenel Lucks, MD;  Location: [facility];  Service: Gastroenterology;  Laterality: N/A;   HEMOSTASIS CLIP PLACEMENT  04/17/2022   Procedure: HEMOSTASIS CLIP PLACEMENT;  Surgeon: Beverley Fiedler, MD;  Location: [facility];  Service: Gastroenterology;;   HEMOSTASIS CLIP PLACEMENT  06/26/2022   Procedure: HEMOSTASIS CLIP PLACEMENT;  Surgeon: Jenel Lucks, MD;  Location: [facility];  Service: Gastroenterology;;   HOT HEMOSTASIS N/A 04/17/2022    Procedure: HOT HEMOSTASIS (ARGON PLASMA COAGULATION/BICAP);  Surgeon: Beverley Fiedler, MD;  Location: [facility];  Service: Gastroenterology;  Laterality: N/A;   HOT HEMOSTASIS N/A 06/26/2022   Procedure: HOT HEMOSTASIS (ARGON PLASMA COAGULATION/BICAP);  Surgeon: Jenel Lucks, MD;  Location: [facility];  Service: Gastroenterology;  Laterality: N/A;   POLYPECTOMY  04/17/2022   Procedure: POLYPECTOMY;  Surgeon: Beverley Fiedler, MD;  Location: [facility];  Service: Gastroenterology;;   POLYPECTOMY  06/26/2022   Procedure: POLYPECTOMY;  Surgeon: Jenel Lucks, MD;  Location: [facility];  Service: Gastroenterology;;   SMALL INTESTINE SURGERY  1994   SUBMUCOSAL TATTOO INJECTION  06/26/2022   Procedure: SUBMUCOSAL TATTOO INJECTION;  Surgeon: Jenel Lucks, MD;  Location: [facility];  Service: Gastroenterology;;   THYMECTOMY N/A    TRANSESOPHAGEAL ECHOCARDIOGRAM (CATH LAB) N/A 10/24/2023   Procedure: TRANSESOPHAGEAL ECHOCARDIOGRAM;  Surgeon: [name], MD;  Location: [facility];  Service: Cardiovascular;  Laterality: N/A;   Social History   Tobacco Use  Smoking Status Former   Current packs/day: 0.00   Types: Cigarettes   Quit date: 2002   Years since quitting: 23.1  Smokeless Tobacco Never  Tobacco Comments   Started smoking in her early 20's.   Smoked 0.5PP at her heaviest.     Social History   Substance and Sexual Activity  Alcohol Use Not Currently   Allergies  Allergen Reactions   Aspirin Other (See Comments)    "Due to intestinal issue"   Sulfa Antibiotics Other (See Comments)    bleeding    Current Facility-Administered Medications  Medication Dose Route Frequency Provider Last Rate Last Admin   0.9 %  sodium chloride infusion  250 mL Intravenous PRN [place] Ouch, MD       acetaminophen (TYLENOL) tablet 1,000 mg  1,000 mg Oral Q6H PRN Iran Ouch, MD   1,000 mg at 10/31/23 0144   cholestyramine light (PREVALITE) packet 4 g   4 g Oral Daily Iran Ouch, MD       dextromethorphan (DELSYM) 30 MG/5ML liquid 60 mg  60 mg Oral BID PRN Iran Ouch, MD   60 mg at 11/01/23 0915   famotidine (PEPCID) tablet 40 mg  40 mg Oral QHS Lorine Bears A, MD   40 mg at 10/31/23 2131   folic acid (FOLVITE) tablet 1 mg  1 mg Oral Daily Lorine Bears A, MD       furosemide (LASIX) injection 20 mg  20 mg Intravenous BID Lorine Bears A, MD       hydroxyurea (HYDREA) capsule 500 mg  500 mg Oral Daily Lorine Bears A, MD   500 mg at 11/01/23 9604   And   hydroxyurea (HYDREA) capsule 1,000 mg  1,000 mg Oral QHS Lorine Bears A, MD   1,000 mg at 10/31/23 2131   ondansetron (ZOFRAN) injection 4 mg  4 mg Intravenous Q6H PRN Lorine Bears A, MD       sodium chloride flush (NS) 0.9 % injection 3 mL  3 mL Intravenous  Q12H Iran Ouch, MD       sodium chloride flush (NS) 0.9 % injection 3 mL  3 mL Intravenous PRN Iran Ouch, MD       sucralfate (CARAFATE) tablet 1 g  1 g Oral QHS Lorine Bears A, MD   1 g at 10/31/23 2131    Medications Prior to Admission  Medication Sig Dispense Refill Last Dose/Taking   acetaminophen (TYLENOL) 500 MG tablet Take 1,000 mg by mouth every 6 (six) hours as needed (pain.).   10/30/2023   Ascorbic Acid (VITAMIN C) 1000 MG tablet Take 1,000 mg by mouth in the morning.   10/30/2023 Morning   cholecalciferol (VITAMIN D3) 25 MCG (1000 UNIT) tablet Take 1,000 Units by mouth in the morning.   10/30/2023 Morning   cholestyramine light (PREVALITE) 4 g packet Take 4 g by mouth daily. *1/2 packet in morning and 1/2 packet at night*   10/30/2023   famotidine (PEPCID) 40 MG tablet TAKE 1 TABLET(40 MG) BY MOUTH DAILY (Patient taking differently: Take 40 mg by mouth at bedtime.) 90 tablet 0 10/30/2023 Bedtime   folic acid (FOLVITE) 1 MG tablet Take 1 tablet (1 mg total) by mouth daily. (Patient taking differently: Take 1 mg by mouth at bedtime.) 90 tablet 1 10/30/2023   furosemide (LASIX) 20 MG tablet Take  1 tablet (20 mg total) by mouth daily. 90 tablet 3 10/30/2023 Morning   Glucosamine-Chondroitin (COSAMIN DS PO) Take 1 tablet by mouth daily.   10/29/2023   hydroxyurea (HYDREA) 500 MG capsule Take 1 capsule (500 mg total) by mouth 2 (two) times daily. (Patient taking differently: Take 500 mg by mouth See admin instructions. Take 1 capsule (500 mg) by mouth in the morning & take 2 capsules (1000 mg) by mouth at night.) 180 capsule 1 10/30/2023 Morning   Multiple Vitamin (MULTIVITAMIN WITH MINERALS) TABS tablet Take 1 tablet by mouth in the morning.   10/29/2023   Multiple Vitamins-Minerals (PRESERVISION AREDS 2+MULTI VIT PO) Take 1 capsule by mouth 2 (two) times daily.   10/30/2023   sucralfate (CARAFATE) 1 g tablet TAKE 1 TABLET(1 GRAM) BY MOUTH FOUR TIMES DAILY AT BEDTIME WITH MEALS (Patient taking differently: Take 1 g by mouth at bedtime.) 360 tablet 1 10/29/2023   metoprolol tartrate (LOPRESSOR) 25 MG tablet Take 1/2 tablet ( 12.5 mg ) twice a day (Patient taking differently: Take 12.5 mg by mouth 2 (two) times daily as needed (palpitation.).) 90 tablet 3     Family History  Problem Relation Age of Onset   Heart disease Mother    Valvular heart disease Mother    Healthy Sister    Alcoholism Half-Sister      Review of Systems:      Cardiac Review of Systems: Y or  [    ]= no  Chest Pain [  N  ]  Resting SOB [   ] Exertional SOB  [ Y ]  Orthopnea [  ]   Pedal Edema [ N  ]    Palpitations [ Y ] Syncope  [  ]   Presyncope [   ]  General Review of Systems: [Y] = yes [  ]=no Constitional: recent weight change [  ]; anorexia [  ]; fatigue [ Y ]; nausea [  ]; night sweats [  ]; fever [  ]; or chills [  ]  Dental: Last Dentist visit:  August 2024  Eye : blurred vision [  ]; diplopia [   ]; vision changes [  ];  Amaurosis fugax[  ]; Resp: cough [ Y ];  wheezing[  ];  hemoptysis[  ]; shortness of breath[  ]; paroxysmal nocturnal dyspnea[  ];  dyspnea on exertion[Y ]; or orthopnea[  ];  GI:  gallstones[  ], vomiting[  ];  dysphagia[  ]; melena[  ];  hematochezia [  ]; heartburn[  ];   Hx of  Colonoscopy[  ]; GU: kidney stones [  ]; hematuria[  ];   dysuria [  ];  nocturia[  ];  history of     obstruction [  ]; urinary frequency [  ]             Skin: rash, swelling[ N ];, hair loss[  ];  peripheral edema[N ];  or itching[  ]; Musculosketetal: myalgias[  ];  joint swelling[  ];  joint erythema[  ];  joint pain[  ];  back pain[  ];  Heme/Lymph: bruising[  ];  bleeding[  ];  anemia[  ];  Neuro: TIA[  ];  headaches[ N ];  stroke[  ];  vertigo[  ];  seizures[  ];   paresthesias[  ];  difficulty walking[ N ];  Psych:depression[  ]; anxiety[  ];  Endocrine: diabetes[ N ];  thyroid dysfunction[N  ];  Physical Exam: BP (!) 99/55 (BP Location: Left Arm)   Pulse 78   Temp (!) 97.4 F (36.3 C) (Oral)   Resp 18   Ht 5\' 6"  (1.676 m)   Wt 64 kg   LMP  (LMP Unknown)   SpO2 98%   BMI 22.77 kg/m   General appearance: alert, cooperative, no distress, and patient looks great Head: Normocephalic, without obvious abnormality, atraumatic Neck: no adenopathy, no carotid bruit, no JVD, supple, symmetrical, trachea midline, and thyroid not enlarged, symmetric, no tenderness/mass/nodules Resp: clear to auscultation bilaterally Cardio: regular rate and rhythm GI: soft, non-tender; bowel sounds normal; no masses,  no organomegaly Extremities: extremities normal, atraumatic, no cyanosis or edema Neurologic: Grossly normal  Diagnostic Studies & Laboratory data:     Recent Radiology Findings:   CARDIAC CATHETERIZATION Result Date: 11/01/2023 1.  Normal coronary arteries. 2.  Left ventricular angiography was not performed.  EF was normal by echo. 3.  Severe mitral annular calcifications. 4.  Right heart catheterization showed moderately elevated wedge pressure, moderate pulmonary hypertension and high cardiac output.  Giant V waves on pulmonary wedge  tracing consistent with severe mitral regurgitation. Recommendations: Will diurese with IV Lasix 20 mg twice daily. Recommend surgical consultation for mitral valve repair/replacement.   CT Angio Chest PE W and/or Wo Contrast Result Date: 10/30/2023 CLINICAL DATA:  Shortness of breath and tachycardia EXAM: CT ANGIOGRAPHY CHEST WITH CONTRAST TECHNIQUE: Multidetector CT imaging of the chest was performed using the standard protocol during bolus administration of intravenous contrast. Multiplanar CT image reconstructions and MIPs were obtained to evaluate the vascular anatomy. RADIATION DOSE REDUCTION: This exam was performed according to the departmental dose-optimization program which includes automated exposure control, adjustment of the mA and/or kV according to patient size and/or use of iterative reconstruction technique. CONTRAST:  75mL OMNIPAQUE IOHEXOL 350 MG/ML SOLN COMPARISON:  Film from earlier in the same day. FINDINGS: Cardiovascular: Thoracic aorta shows atherosclerotic calcifications. No significant enhancement is identified. No aneurysmal dilatation is seen. The heart is at the upper limits of normal in size. Stable mitral  calcifications are noted. The pulmonary artery shows a normal branching pattern bilaterally. No intraluminal filling defect is identified to suggest pulmonary embolism. Mediastinum/Nodes: Thoracic inlet is within normal limits. No hilar or mediastinal adenopathy is noted. The esophagus as visualized is within normal limits. Lungs/Pleura: The lungs are well aerated bilaterally. No focal infiltrate or effusion is seen. No parenchymal nodules are noted. Upper Abdomen: Visualized upper abdomen shows no acute abnormality. Musculoskeletal: No acute rib abnormality is noted. Review of the MIP images confirms the above findings. IMPRESSION: No evidence of pulmonary embolism. No acute abnormality noted. Electronically Signed   By: Alcide Clever M.D.   On: 10/30/2023 20:10   DG Chest 2  View Result Date: 10/30/2023 CLINICAL DATA:  Chest pain EXAM: CHEST - 2 VIEW COMPARISON:  10/15/2023 FINDINGS: Sternotomy. No acute airspace disease or effusion. Normal cardiac size. No pneumothorax. Metallic artifact over the left chest IMPRESSION: No active cardiopulmonary disease. Electronically Signed   By: Jasmine Pang M.D.   On: 10/30/2023 19:20     I have independently reviewed the above radiologic studies and discussed with the patient   Recent Lab Findings: Lab Results  Component Value Date   WBC 6.8 11/01/2023   HGB 12.7 11/01/2023   HCT 37.2 11/01/2023   PLT 309 11/01/2023   GLUCOSE 104 (H) 11/01/2023   ALT 25 10/15/2023   AST 30 10/15/2023   NA 135 11/01/2023   K 4.1 11/01/2023   CL 106 11/01/2023   CREATININE 0.78 11/01/2023   BUN 13 11/01/2023   CO2 25 11/01/2023   TSH 1.488 10/30/2023   INR 1.0 06/24/2022   HGBA1C 4.4 (L) 04/16/2022    Assessment / Plan:      Mitral Valve Prolapse with Severe Mitral Regurgitation H/O GI Bleeding due to GI AVMs' very hesitant to have blood thinners, explained these are required for surgical procedure and she is agreeable.  I told her we will monitor her very closely and intervene immediately should we notice GI bleeding Gastritis- using Carafate Thrombocytosis- followed by Hematology.Marland Kitchen on Hydroxyurea PVCs- was undergoing evaluation with ZIO monitor on outpatient basis  Dispo- patient presented to ED with complaints of worsening fatigue and shortness of breath.  No chest pain and no evidence of heart failure.  She is agreeable to anti-coagulation for surgery.  Dr. Leafy Ro will evaluate on Monday to further discuss surgery.  She will require MV Repair vs. Replacement.  She will be a Redo Sternotomy with previous surgery for thymectomy in her 98s.  The risks and benefits of the procedure were explained to the patient and she was agreeable to proceed.  I  spent 40 minutes counseling the patient face to face.   Christyn Gutkowski,  PA-C 11/01/2023 1:17 PM

## 2023-11-01 NOTE — Interval H&P Note (Signed)
 History and Physical Interval Note:  11/01/2023 12:11 PM  Kristina Osborne  has presented today for surgery, with the diagnosis of mr.  The various methods of treatment have been discussed with the patient and family. After consideration of risks, benefits and other options for treatment, the patient has consented to  Procedure(s): RIGHT/LEFT HEART CATH AND CORONARY ANGIOGRAPHY (N/A) as a surgical intervention.  The patient's history has been reviewed, patient examined, no change in status, stable for surgery.  I have reviewed the patient's chart and labs.  Questions were answered to the patient's satisfaction.     Lorine Bears

## 2023-11-01 NOTE — Progress Notes (Addendum)
 Patient back to 4E from cath lab. VSS. Telemetry box applied, CCMD notified. Call bell in reach.   11/01/23 1305  Vitals  BP (!) 99/55  MAP (mmHg) 69  BP Location Left Arm  BP Method Automatic  Patient Position (if appropriate) Lying  Pulse Rate 78  Pulse Rate Source Monitor  ECG Heart Rate 80  Resp 18  MEWS COLOR  MEWS Score Color Green  Oxygen Therapy  SpO2 98 %  O2 Device Room Air  MEWS Score  MEWS Temp 0  MEWS Systolic 1  MEWS Pulse 0  MEWS RR 0  MEWS LOC 0  MEWS Score 1

## 2023-11-01 NOTE — Telephone Encounter (Signed)
 Kristina Osborne contacted me yesterday evening, and informed me that she was in the hospital, likely undergoing mitral valve repair.  As part of her preoperative evaluation she is also going to undergo cardiac catheterization. She is very concerned about having recurrent bleeding from her gastrointestinal AVMs if she needs to be anticoagulated.  She has a history of significant bleeding when she was taking aspirin, but she has not had any bleeding problems since her aspirin was discontinued in 2023.  I reassured her that the heparin that she would be receiving in association with catheterization would pose no significant bleeding risk, but if she does have significant coronary artery disease have require stents, then she would need indefinite aspirin therapy, and a period of dual antiplatelet therapy.  She would be at increased risk for bleeding in this situation.  However, we discussed how GI bleeding from gastrointestinal AVMs is rarely life-threatening, whereas decreased blood flow to the heart can be.  I recommended to the patient that if she requires coronary stenting or anticoagulation following mitral valve repair, then the benefits of those interventions would likely outweigh the risk of subsequent GI bleeding from her intestinal AVMs.

## 2023-11-02 ENCOUNTER — Other Ambulatory Visit (HOSPITAL_COMMUNITY): Payer: Self-pay

## 2023-11-02 ENCOUNTER — Inpatient Hospital Stay (HOSPITAL_COMMUNITY)

## 2023-11-02 ENCOUNTER — Ambulatory Visit: Payer: Medicare Other | Admitting: Internal Medicine

## 2023-11-02 DIAGNOSIS — R059 Cough, unspecified: Secondary | ICD-10-CM | POA: Insufficient documentation

## 2023-11-02 DIAGNOSIS — D75839 Thrombocytosis, unspecified: Secondary | ICD-10-CM

## 2023-11-02 DIAGNOSIS — I272 Pulmonary hypertension, unspecified: Secondary | ICD-10-CM | POA: Insufficient documentation

## 2023-11-02 DIAGNOSIS — I5030 Unspecified diastolic (congestive) heart failure: Secondary | ICD-10-CM | POA: Insufficient documentation

## 2023-11-02 DIAGNOSIS — R058 Other specified cough: Secondary | ICD-10-CM | POA: Diagnosis not present

## 2023-11-02 DIAGNOSIS — I340 Nonrheumatic mitral (valve) insufficiency: Secondary | ICD-10-CM

## 2023-11-02 DIAGNOSIS — I341 Nonrheumatic mitral (valve) prolapse: Secondary | ICD-10-CM | POA: Diagnosis not present

## 2023-11-02 LAB — BASIC METABOLIC PANEL
Anion gap: 8 (ref 5–15)
BUN: 14 mg/dL (ref 8–23)
CO2: 24 mmol/L (ref 22–32)
Calcium: 9.3 mg/dL (ref 8.9–10.3)
Chloride: 105 mmol/L (ref 98–111)
Creatinine, Ser: 0.73 mg/dL (ref 0.44–1.00)
GFR, Estimated: >60 mL/min (ref >60)
Glucose, Bld: 110 mg/dL — ABNORMAL HIGH (ref 70–99)
Potassium: 3.9 mmol/L (ref 3.5–5.1)
Sodium: 137 mmol/L (ref 135–145)

## 2023-11-02 LAB — CBC
HCT: 37.1 % (ref 36.0–46.0)
Hemoglobin: 12.5 g/dL (ref 12.0–15.0)
MCH: 39.9 pg — ABNORMAL HIGH (ref 26.0–34.0)
MCHC: 33.7 g/dL (ref 30.0–36.0)
MCV: 118.5 fL — ABNORMAL HIGH (ref 80.0–100.0)
Platelets: 290 10*3/uL (ref 150–400)
RBC: 3.13 MIL/uL — ABNORMAL LOW (ref 3.87–5.11)
RDW: 14.6 % (ref 11.5–15.5)
WBC: 5.4 10*3/uL (ref 4.0–10.5)
nRBC: 0 % (ref 0.0–0.2)

## 2023-11-02 MED ORDER — FUROSEMIDE 40 MG PO TABS: 40.0000 mg | ORAL_TABLET | Freq: Every day | ORAL | 2 refills | Status: DC

## 2023-11-02 MED ORDER — FUROSEMIDE 20 MG PO TABS
40.0000 mg | ORAL_TABLET | Freq: Every day | ORAL | 3 refills | Status: DC
  Filled 2023-11-02: qty 90, 45d supply, fill #0

## 2023-11-02 NOTE — Plan of Care (Signed)

## 2023-11-02 NOTE — Plan of Care (Signed)
 Problem: Education: Goal: Knowledge of General Education information will improve Description: Including pain rating scale, medication(s)/side effects and non-pharmacologic comfort measures 11/02/2023 1212 by Dallas Breeding, RN Outcome: Adequate for Discharge 11/02/2023 1211 by Dallas Breeding, RN Outcome: Progressing   Problem: Health Behavior/Discharge Planning: Goal: Ability to manage health-related needs will improve Outcome: Adequate for Discharge   Problem: Clinical Measurements: Goal: Ability to maintain clinical measurements within normal limits will improve Outcome: Adequate for Discharge Goal: Will remain free from infection Outcome: Adequate for Discharge Goal: Diagnostic test results will improve Outcome: Adequate for Discharge Goal: Respiratory complications will improve Outcome: Adequate for Discharge Goal: Cardiovascular complication will be avoided Outcome: Adequate for Discharge   Problem: Activity: Goal: Risk for activity intolerance will decrease 11/02/2023 1212 by Dallas Breeding, RN Outcome: Adequate for Discharge 11/02/2023 1211 by Dallas Breeding, RN Outcome: Progressing   Problem: Nutrition: Goal: Adequate nutrition will be maintained 11/02/2023 1212 by Dallas Breeding, RN Outcome: Adequate for Discharge 11/02/2023 1211 by Dallas Breeding, RN Outcome: Progressing   Problem: Coping: Goal: Level of anxiety will decrease 11/02/2023 1212 by Dallas Breeding, RN Outcome: Adequate for Discharge 11/02/2023 1211 by Dallas Breeding, RN Outcome: Progressing   Problem: Elimination: Goal: Will not experience complications related to bowel motility 11/02/2023 1212 by Dallas Breeding, RN Outcome: Adequate for Discharge 11/02/2023 1211 by Dallas Breeding, RN Outcome: Progressing Goal: Will not experience complications related to urinary retention Outcome: Adequate for Discharge   Problem: Pain  Managment: Goal: General experience of comfort will improve and/or be controlled Outcome: Adequate for Discharge   Problem: Safety: Goal: Ability to remain free from injury will improve Outcome: Adequate for Discharge   Problem: Skin Integrity: Goal: Risk for impaired skin integrity will decrease Outcome: Adequate for Discharge   Problem: Education: Goal: Knowledge of General Education information will improve Description: Including pain rating scale, medication(s)/side effects and non-pharmacologic comfort measures Outcome: Adequate for Discharge   Problem: Health Behavior/Discharge Planning: Goal: Ability to manage health-related needs will improve Outcome: Adequate for Discharge   Problem: Clinical Measurements: Goal: Ability to maintain clinical measurements within normal limits will improve Outcome: Adequate for Discharge Goal: Will remain free from infection Outcome: Adequate for Discharge Goal: Diagnostic test results will improve Outcome: Adequate for Discharge Goal: Respiratory complications will improve Outcome: Adequate for Discharge Goal: Cardiovascular complication will be avoided Outcome: Adequate for Discharge   Problem: Activity: Goal: Risk for activity intolerance will decrease Outcome: Adequate for Discharge   Problem: Nutrition: Goal: Adequate nutrition will be maintained Outcome: Adequate for Discharge   Problem: Coping: Goal: Level of anxiety will decrease Outcome: Adequate for Discharge   Problem: Elimination: Goal: Will not experience complications related to bowel motility Outcome: Adequate for Discharge Goal: Will not experience complications related to urinary retention Outcome: Adequate for Discharge   Problem: Pain Managment: Goal: General experience of comfort will improve and/or be controlled Outcome: Adequate for Discharge   Problem: Safety: Goal: Ability to remain free from injury will improve Outcome: Adequate for Discharge    Problem: Skin Integrity: Goal: Risk for impaired skin integrity will decrease Outcome: Adequate for Discharge   Problem: Education: Goal: Understanding of CV disease, CV risk reduction, and recovery process will improve Outcome: Adequate for Discharge Goal: Individualized Educational Video(s) Outcome: Adequate for Discharge   Problem: Activity: Goal: Ability to return to baseline activity level will improve Outcome: Adequate for Discharge   Problem: Cardiovascular: Goal: Ability to achieve and maintain adequate cardiovascular perfusion  will improve Outcome: Adequate for Discharge   Problem: Education: Goal: Knowledge of disease or condition will improve Outcome: Adequate for Discharge Goal: Understanding of medication regimen will improve Outcome: Adequate for Discharge Goal: Individualized Educational Video(s) Outcome: Adequate for Discharge   Problem: Activity: Goal: Ability to tolerate increased activity will improve Outcome: Adequate for Discharge   Problem: Cardiac: Goal: Ability to achieve and maintain adequate cardiopulmonary perfusion will improve Outcome: Adequate for Discharge   Problem: Health Behavior/Discharge Planning: Goal: Ability to safely manage health-related needs after discharge will improve Outcome: Adequate for Discharge

## 2023-11-02 NOTE — Progress Notes (Signed)
 Pt has been ambulating hall. C/o SOB which surprises her. HR 90-105 ST in room. Discussed light mobilization preop.  Discussed with pt IS (1400 ml currently, coughing with complete inspiration), sternal precautions, mobility post op, and d/c planning. She is receptive. She cares for her husband but has set up 24 hr HH assist for him. Gave her [facility] booklet, careguide, and preop video.  [id number] Kristina Osborne BS, ACSM-CEP 11/02/2023 10:17 AM

## 2023-11-02 NOTE — Discharge Summary (Addendum)
 Discharge Summary    Patient ID: Kristina Osborne MRN: [id number]; DOB: 1950/12/06  Admit date: 10/30/2023 Discharge date: 11/02/2023  PCP:  Corwin Levins, MD   [facility] Providers Cardiologist:  [name], MD   {  Discharge Diagnoses    Principal Problem:   Severe mitral regurgitation Active Problems:   Thrombocytosis   Valvular regurgitation   Pulmonary hypertension, unspecified (HCC)   (HFpEF) heart failure with preserved ejection fraction (HCC)   Cough  Diagnostic Studies/Procedures    TEE 10/24/2023 1. Left ventricular ejection fraction, by estimation, is 60 to 65%. The  left ventricle has normal function.   2. Right ventricular systolic function is normal. The right ventricular  size is normal.   3. Left atrial size was severely dilated. No left atrial/left atrial  appendage thrombus was detected.   4. Severe flail of posterior leaflet.   5. The mitral valve is degenerative. Severe mitral valve regurgitation.  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   6. The aortic valve is tricuspid. Aortic valve regurgitation is not  visualized. No aortic stenosis is present.   7. 3D performed of the mitral valve and demonstrates 3D views to  demonstrate flail posterior leaflet and eccentric MR.   Conclusion(s)/Recommendation(s): Severe MR with flail posterior mitral  leaflet that may be amenable to repair. Will refer to Dr. Leafy Ro (CT  surgery) for further evaluation. Of note, she had prior sternotomy for  thymic hyperplasia.    Echocardiogram 10/11/2023 1. Left ventricular ejection fraction, by estimation, is 60 to 65%. Left  ventricular ejection fraction by 3D volume is 62 %. The left ventricle has  normal function. The left ventricle has no regional wall motion  abnormalities. Left ventricular diastolic   parameters are indeterminate. The average left ventricular global  longitudinal  strain is -25.9 %. The global longitudinal strain is normal.   2. Right ventricular systolic function is normal. The right ventricular  size is normal.   3. Left atrial size was severely dilated.   4. The MV is markedly abnormal. There is moderate calcification and  deformity of the posterior leaflet with at least moderate prolapse. There  is very severe posteriorly directed MR filling the entire LA. The mitral  valve is abnormal. Severe mitral valve   regurgitation. No evidence of mitral stenosis. There is prolapse of  multiple scallops of the posterior leaflet of the mitral valve.   5. The aortic valve is normal in structure. Aortic valve regurgitation is  not visualized. Aortic valve sclerosis/calcification is present, without  any evidence of aortic stenosis.   6. The inferior vena cava is normal in size with greater than 50%  respiratory variability, suggesting right atrial pressure of 3 mmHg.   Conclusion(s)/Recommendation(s): Suggest TEE to further evalaute the  mitral valve.   Left & R Heart Catheterization  11/01/23 Diagnostic Dominance: Right Left Main  Vessel is angiographically normal.    Left Anterior Descending    First Diagonal Branch  Vessel is angiographically normal.    Second Diagonal Branch  Vessel is angiographically normal.    Third Diagonal Branch  Vessel is angiographically normal.    Left Circumflex  Vessel is angiographically normal.    First Obtuse Marginal Branch  Vessel is small in size. Vessel is angiographically normal.    Second Obtuse Marginal Branch  Vessel is angiographically normal.    Third Obtuse Marginal Branch  Vessel is angiographically  normal.    Right Coronary Artery  Vessel is angiographically normal.    Right Posterior Descending Artery  Vessel is angiographically normal.    Right Posterior Atrioventricular Artery  Vessel is angiographically normal.    First Right Posterolateral Branch  Vessel is angiographically  normal.    Intervention   No interventions have been documented.   Hemo Data  Flowsheet Row Most Recent Value  Fick Cardiac Output 6.97 L/min  Fick Cardiac Output Index 4.05 (L/min)/BSA  RA A Wave 9 mmHg  RA V Wave 6 mmHg  RA Mean 5 mmHg  RV Systolic Pressure 44 mmHg  RV Diastolic Pressure 2 mmHg  RV EDP 12 mmHg  PA Systolic Pressure 49 mmHg  PA Diastolic Pressure 17 mmHg  PA Mean 31 mmHg  PW A Wave 22 mmHg  PW V Wave 48 mmHg  PW Mean 27 mmHg  AO Systolic Pressure 100 mmHg  AO Diastolic Pressure 64 mmHg  AO Mean 77 mmHg  LV Systolic Pressure 117 mmHg  LV Diastolic Pressure 5 mmHg  LV EDP 16 mmHg  Arterial Occlusion Pressure Extended Systolic Pressure 104 mmHg  Arterial Occlusion Pressure Extended Diastolic Pressure 71 mmHg  Arterial Occlusion Pressure Extended Mean Pressure 81 mmHg  Left Ventricular Apex Extended Systolic Pressure 108 mmHg  LVp Diastolic Pressure 7 mmHg  Left Ventricular Apex Extended EDP Pressure 23 mmHg  QP/QS 1  TPVR Index 7.65 HRUI  TSVR Index 19 HRUI  PVR SVR Ratio 0.06  TPVR/TSVR Ratio 0.4   Conclusion 1.  Normal coronary arteries. 2.  Left ventricular angiography was not performed.  EF was normal by echo. 3.  Severe mitral annular calcifications. 4.  Right heart catheterization showed moderately elevated wedge pressure, moderate pulmonary hypertension and high cardiac output.  Giant V waves on pulmonary wedge tracing consistent with severe mitral regurgitation.   Recommendations: Will diurese with IV Lasix 20 mg twice daily. Recommend surgical consultation for mitral valve repair/replacement.    _____________   History of Present Illness     Kristina Osborne is a 73 y.o. female with severe MR secondary to prolapse, palpitations, PVCs, thrombocytosis, gastritis, remote history of AVMs who  was evaluated 10/31/2023 for worsening shortness of breath/severe MR.   Ms. Osborne was seen by Dr. Rennis Golden in February 2023 noted to have some mild  regurgitation and prolapse of the MV.  Lost in follow-up eventually was prompted back to follow-up when she had echocardiogram on 10/11/23 that showed preserved EF with moderate calcification and deformity of the posterior leaflet with moderate prolapse.  Very severely directed MR filling of the entire LA.  Severe MR.  Continued to undergo evaluation for this had TEE confirming severe flail of the posterior leaflet and degenerative MV with severe MR.  No appendage thrombus.  She  was scheduled for right and left heart catheterization on 11/01/23 but due to severe worsening shortness of breath went to [facility].   Patient initially seen at [facility] noting significant shortness of breath and DOE.  She reports that for the last 10 days she really has had a rapid progression and shortness of breath and only able to ambulate 25 to 50 feet whereas before she could exercise and walk up hills with little to no difficulty.  However she denies any chest pain, orthopnea, palpitations, peripheral edema.  She does have significant cough with mild chest discomfort when this occurs but had negative respiratory panel.  In regards to her PVCs, reportedly had this surrounding acute illness  of flu but after fluid hydration she has not had any significant recurrences and is not even taking her Lopressor at home. Also with 2 week heart monitor that she is still wearing.    CTA negative for PE.  Negative chest x-ray.  Negative troponins x 2.  Normal TSH and T4.  Potassium 3.7.  Normal creatinine.  BNP 99.4.  Hemoglobin 13.2.  Platelets 402 (sees a hematologist).  Hospital Course     Consultants: Cardiothoracic surgery   General:  Well nourished, well developed, in no acute distress HEENT: normal Neck: no JVD Vascular: No carotid bruits; Distal pulses 2+ bilaterally   Cardiac:  normal S1, S2; RRR; MVR 4/6 radiating to axilla  Lungs:  clear to auscultation bilaterally, no wheezing, rhonchi or rales  Abd: soft,  nontender, no hepatomegaly  Ext: no edema Musculoskeletal:  No deformities, BUE and BLE strength normal and equal; radial access site is without hematoma or abnormalities.  Skin: warm and dry  Neuro:  CNs 2-12 intact, no focal abnormalities noted Psych:  Normal affect   Severe MR with MVP  - TEE 10/24/23 confirmed Severe MR with flail posterior mitral leaflet. LVEF preserved 60-60%. LA severely dilated.  - Scheduled for [facility] on 11/01/23 outpatient but presented to [facility] with significant worsening SOB and fatigue  then transferred to [facility] for plans with cardiac cath.  - Negative CTA for PE, CXR negative  - d/c po Lasix 20 mg yesterday.  She was taken for pleural effusion which has resolved. Can give prn  - 11/01/23: this am, she noted improvement in SOB. Still present with exertion or ambulating in room but resolved at rest. - Very concerned about repeat GI bleed with the use of antiplatelets or anticoagulants. Last GI bleed in 2023 and has remained stable since then.  - LHC 11/01/23: normal coronary arteries - RHC 11/01/23: moderately elevated wedge pressure, moderate pulmonary hypertension and high cardiac output.  Giant V waves on pulmonary wedge tracing consistent with severe mitral regurgitation. PLAN: IV lasix 20 mg  & surgical consultation for mitral valve repair/replacement. - Discussed limiting physical activity for at least 1 week with recent radial access site for cath.  - 11/02/23: this am, she denies any CP, SOB or dizziness. She was ambulating in the halls by herself with any complaint. She denies any edema  She admits to improvement with cough throughout the night with IV lasix 20 mg. She was complaint with PO lasix 20 mg daily at home but reports it was night working.   - D/c IV lasix and continue home PO Lasix 20 daily  - Apt scheduled with Dr. Milinda Antis on Monday 11/05/23 at 2 pm. She will need to call to confirm appointment on Monday. She is aware and understands.  - Follow up  outpatient on 11/20/23 at 10:05 am with [facility]   Palpitations - History of PVCs but denies any issues at this moment - Reported exacerbated by dehydration has resolved now - She prefers to not take Lopressor at home.  - Can consider restarting in the future if need and VS allow.    Cough - no clear etiology  - Imaging without any infectious pathology, negative respiratory panel.  - Not likely related to volume.  - Continue to monitor, still persistent  - this am, reported improvement with cough on IV lasix last night.   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  - denied Lovenox since she cannot be on blood thinner with AVM history   Essential thrombocytosis with positive JAK2 mutation - currently on hydroxyurea followed by Dr. Jeanie Sewer.   Did the patient have an acute coronary syndrome (MI, NSTEMI, STEMI, etc) this admission?:  No                               Did the patient have a percutaneous coronary intervention (stent / angioplasty)?:  Yes.     Cath/PCI Registry Performance & Quality Measures: Aspirin prescribed? -  ADP Receptor Inhibitor (Plavix/Clopidogrel, Brilinta/Ticagrelor or Effient/Prasugrel) prescribed (includes medically managed patients)? -  High Intensity Statin (Lipitor 40-80mg  or Crestor 20-40mg ) prescribed? -  For EF <40%, was ACEI/ARB prescribed? - Not Applicable (EF >/= 40%) For EF <40%, Aldosterone Antagonist (Spironolactone or Eplerenone) prescribed? - Not Applicable (EF >/= 40%) Cardiac Rehab Phase II ordered? -       The patient will be scheduled for a TOC follow up appointment in 10-14 days.  A message has been sent to the [facility] and Scheduling Pool at the office where the patient should be seen for follow up.  _____________  Discharge Vitals Blood pressure 90/64, pulse 76, temperature 98.3 F (36.8 C), temperature source Oral,  resp. rate 20, height 5\' 6"  (1.676 m), weight 64 kg, SpO2 96%.  Filed Weights   10/31/23 1626  Weight: 64 kg    Labs & Radiologic Studies    CBC Recent Labs    11/01/23 0333 11/01/23 1231 11/01/23 1235 11/02/23 0319  WBC 6.8  --   --  5.4  HGB 12.7   < > 12.2 12.5  HCT 37.2   < > 36.0 37.1  MCV 118.8*  --   --  118.5*  PLT 309  --   --  290   < > = values in this interval not displayed.   Basic Metabolic Panel Recent Labs    27/25/36 0333 11/01/23 1231 11/01/23 1235 11/02/23 0319  NA 135   < > 140 137  K 4.1   < > 3.6 3.9  CL 106  --   --  105  CO2 25  --   --  24  GLUCOSE 104*  --   --  110*  BUN 13  --   --  14  CREATININE 0.78  --   --  0.73  CALCIUM 9.2  --   --  9.3   < > = values in this interval not displayed.   Liver Function Tests No results for input(s): "AST", "ALT", "ALKPHOS", "BILITOT", "PROT", "ALBUMIN" in the last 72 hours. No results for input(s): "LIPASE", "AMYLASE" in the last 72 hours. High Sensitivity Troponin:   Recent Labs  Lab 10/06/23 0853 10/30/23 1623 10/30/23 1822  TROPONINIHS 4 4 3     BNP Invalid input(s): "POCBNP" D-Dimer No results for input(s): "DDIMER" in the last 72 hours. Hemoglobin A1C No results for input(s): "HGBA1C" in the last 72 hours. Fasting Lipid Panel No results for input(s): "CHOL", "HDL", "LDLCALC", "TRIG", "CHOLHDL", "LDLDIRECT" in the last 72 hours. Thyroid Function Tests Recent Labs    10/30/23 1700  TSH 1.488   _____________  CARDIAC CATHETERIZATION Result Date: 11/01/2023 1.  Normal coronary arteries. 2.  Left ventricular angiography was not performed.  EF was normal by echo. 3.  Severe mitral annular calcifications. 4.  Right heart catheterization  showed moderately elevated wedge pressure, moderate pulmonary hypertension and high cardiac output.  Giant V waves on pulmonary wedge tracing consistent with severe mitral regurgitation. Recommendations: Will diurese with IV Lasix 20 mg twice daily.  Recommend surgical consultation for mitral valve repair/replacement.   CT Angio Chest PE W and/or Wo Contrast Result Date: 10/30/2023 CLINICAL DATA:  Shortness of breath and tachycardia EXAM: CT ANGIOGRAPHY CHEST WITH CONTRAST TECHNIQUE: Multidetector CT imaging of the chest was performed using the standard protocol during bolus administration of intravenous contrast. Multiplanar CT image reconstructions and MIPs were obtained to evaluate the vascular anatomy. RADIATION DOSE REDUCTION: This exam was performed according to the departmental dose-optimization program which includes automated exposure control, adjustment of the mA and/or kV according to patient size and/or use of iterative reconstruction technique. CONTRAST:  75mL OMNIPAQUE IOHEXOL 350 MG/ML SOLN COMPARISON:  Film from earlier in the same day. FINDINGS: Cardiovascular: Thoracic aorta shows atherosclerotic calcifications. No significant enhancement is identified. No aneurysmal dilatation is seen. The heart is at the upper limits of normal in size. Stable mitral calcifications are noted. The pulmonary artery shows a normal branching pattern bilaterally. No intraluminal filling defect is identified to suggest pulmonary embolism. Mediastinum/Nodes: Thoracic inlet is within normal limits. No hilar or mediastinal adenopathy is noted. The esophagus as visualized is within normal limits. Lungs/Pleura: The lungs are well aerated bilaterally. No focal infiltrate or effusion is seen. No parenchymal nodules are noted. Upper Abdomen: Visualized upper abdomen shows no acute abnormality. Musculoskeletal: No acute rib abnormality is noted. Review of the MIP images confirms the above findings. IMPRESSION: No evidence of pulmonary embolism. No acute abnormality noted. Electronically Signed   By: Alcide Clever M.D.   On: 10/30/2023 20:10   DG Chest 2 View Result Date: 10/30/2023 CLINICAL DATA:  Chest pain EXAM: CHEST - 2 VIEW COMPARISON:  10/15/2023 FINDINGS:  Sternotomy. No acute airspace disease or effusion. Normal cardiac size. No pneumothorax. Metallic artifact over the left chest IMPRESSION: No active cardiopulmonary disease. Electronically Signed   By: Jasmine Pang M.D.   On: 10/30/2023 19:20   ECHO TEE Result Date: 10/24/2023    TRANSESOPHOGEAL ECHO REPORT   Patient Name:   CHAKIA COUNTS Date of Exam: 10/24/2023 Medical Rec #:  [id number]         Height:       66.0 in Accession #:    [id number]        Weight:       146.4 lb Date of Birth:  Dec 27, 1950         BSA:          1.751 m Patient Age:    72 years          BP:           104/71 mmHg Patient Gender: F                 HR:           90 bpm. Exam Location:  Inpatient Procedure: 3D Echo, Transesophageal Echo, Cardiac Doppler, Limited Color Doppler            and Saline Contrast Bubble Study (Both Spectral and Color Flow            Doppler were utilized during procedure). Indications:    Mitral Valve Prolapse  History:        Patient has prior history of Echocardiogram examinations, most  recent 10/11/2023. Mitral Valve Disease.  Sonographer:    Dondra Prader RVT RCS Referring Phys: [id number] KATLYN D WEST PROCEDURE: After discussion of the risks and benefits of a TEE, an informed consent was obtained from the patient. The transesophogeal probe was passed without difficulty through the esophogus of the patient. Imaged were obtained with the patient in a supine position. Sedation performed by different physician. The patient developed no complications during the procedure.  IMPRESSIONS  1. Left ventricular ejection fraction, by estimation, is 60 to 65%. The left ventricle has normal function.  2. Right ventricular systolic function is normal. The right ventricular size is normal.  3. Left atrial size was severely dilated. No left atrial/left atrial appendage thrombus was detected.  4. Severe flail of posterior leaflet.  5. The mitral valve is degenerative. Severe mitral valve regurgitation. There is  severe late systolic prolapse of multiple scallops of the posterior leaflet of the mitral valve.  6. The aortic valve is tricuspid. Aortic valve regurgitation is not visualized. No aortic stenosis is present.  7. 3D performed of the mitral valve and demonstrates 3D views to demonstrate flail posterior leaflet and eccentric MR. Conclusion(s)/Recommendation(s): Severe MR with flail posterior mitral leaflet that may be amenable to repair. Will refer to Dr. Leafy Ro (CT surgery) for further evaluation. Of note, she had prior sternotomy for thymic hyperplasia. FINDINGS  Left Ventricle: Left ventricular ejection fraction, by estimation, is 60 to 65%. The left ventricle has normal function. Strain imaging was not performed. The left ventricular internal cavity size was normal in size. There is no left ventricular hypertrophy. Right Ventricle: The right ventricular size is normal. No increase in right ventricular wall thickness. Right ventricular systolic function is normal. Left Atrium: Left atrial size was severely dilated. No left atrial/left atrial appendage thrombus was detected. Right Atrium: Right atrial size was normal in size. Pericardium: There is no evidence of pericardial effusion. Mitral Valve: The mitral valve is degenerative in appearance. There is severe late systolic prolapse of multiple scallops of the posterior leaflet of the mitral valve. Severe flail of multiple scallops of the posterior mitral valve leaflet. 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 Severe mitral valve regurgitation, with eccentric medially directed jet. Pulmonary venous flow shows systolic flow reversal. Tricuspid Valve: The tricuspid valve is grossly normal. Tricuspid valve regurgitation is mild. Aortic Valve: The aortic valve is tricuspid. Aortic valve regurgitation is not visualized. No aortic stenosis is present. Pulmonic Valve: The pulmonic valve was grossly normal. Pulmonic valve regurgitation  is trivial. Aorta: The aortic root and ascending aorta are structurally normal, with no evidence of dilitation. There is minimal (Grade I) plaque involving the aortic arch. IAS/Shunts: No atrial level shunt detected by color flow Doppler. Agitated saline contrast was given intravenously to evaluate for intracardiac shunting. Additional Comments: 3D was performed not requiring image post processing on an independent workstation and was abnormal. MR Peak grad:    38.2 mmHg MR Mean grad:    28.0 mmHg MR Vmax:         309.00 cm/s MR Vmean:        242.0 cm/s MR PISA:         6.28 cm MR PISA Eff ROA: 78 mm MR PISA Radius:  1.00 cm Zoila Shutter MD Electronically signed by Zoila Shutter MD Signature Date/Time: 10/24/2023/1:10:11 PM    Final    EP STUDY Result Date: 10/24/2023 See surgical note for result.  CT ABDOMEN PELVIS W CONTRAST  Result Date: 10/15/2023 CLINICAL DATA:  Abdominal pain EXAM: CT ABDOMEN AND PELVIS WITH CONTRAST TECHNIQUE: Multidetector CT imaging of the abdomen and pelvis was performed using the standard protocol following bolus administration of intravenous contrast. RADIATION DOSE REDUCTION: This exam was performed according to the departmental dose-optimization program which includes automated exposure control, adjustment of the mA and/or kV according to patient size and/or use of iterative reconstruction technique. CONTRAST:  75mL OMNIPAQUE IOHEXOL 350 MG/ML SOLN COMPARISON:  None Available. FINDINGS: Lower Chest: Normal. Hepatobiliary: Normal hepatic contours. No intra- or extrahepatic biliary dilatation. The gallbladder is normal. Pancreas: Normal pancreas. No ductal dilatation or peripancreatic fluid collection. Spleen: Normal. Adrenals/Urinary Tract: The adrenal glands are normal. No hydronephrosis, nephroureterolithiasis or solid renal mass. The urinary bladder is normal for degree of distention Stomach/Bowel: There is no hiatal hernia. Normal duodenal course and caliber. No small bowel  dilatation or inflammation. There is mild wall thickening of the distal rectum without inflammatory change. There are anastomoses in the right and left lower quadrants. Vascular/Lymphatic: There is calcific atherosclerosis of the abdominal aorta. No lymphadenopathy. Reproductive: Status post hysterectomy. No adnexal mass. Other: None. Musculoskeletal: No bony spinal canal stenosis or focal osseous abnormality. IMPRESSION: 1. No acute abnormality of the abdomen or pelvis. Electronically Signed   By: Deatra Robinson M.D.   On: 10/15/2023 22:11   DG Chest Portable 1 View Result Date: 10/15/2023 CLINICAL DATA:  Shortness of breath.  Palpitation. EXAM: PORTABLE CHEST 1 VIEW COMPARISON:  Chest radiograph dated 10/06/2023. FINDINGS: No focal consolidation, pleural effusion, pneumothorax. Top-normal cardiac size. Calcification of the mitral annulus. Median sternotomy wires. No acute osseous pathology. IMPRESSION: No active disease. Electronically Signed   By: Elgie Collard M.D.   On: 10/15/2023 21:15   ECHOCARDIOGRAM COMPLETE Result Date: 10/11/2023    ECHOCARDIOGRAM REPORT   Patient Name:   Kristina Osborne Date of Exam: 10/11/2023 Medical Rec #:  [id number]         Height:       66.0 in Accession #:    [id number]        Weight:       170.0 lb Date of Birth:  Jan 22, 1951         BSA:          1.866 m Patient Age:    72 years          BP:           122/70 mmHg Patient Gender: F                 HR:           87 bpm. Exam Location:  [address] Procedure: 2D Echo, 3D Echo and Strain Analysis (Both Spectral and Color Flow            Doppler were utilized during procedure). Indications:    I05.9 Mitral valve disorder  History:        Patient has no prior history of Echocardiogram examinations.                 Mitral Valve Prolapse, Arrythmias:PVC; Signs/Symptoms:Shortness                 of Breath. Tachycardia. Palpitations. Pleural effusions.  Sonographer:    Cathie Beams RCS Referring Phys: [id number] CARMEN L GONZALEZ  IMPRESSIONS  1. Left ventricular ejection fraction, by estimation, is 60 to 65%. Left ventricular ejection fraction by 3D volume is 62 %. The left ventricle has normal function. The left  ventricle has no regional wall motion abnormalities. Left ventricular diastolic  parameters are indeterminate. The average left ventricular global longitudinal strain is -25.9 %. The global longitudinal strain is normal.  2. Right ventricular systolic function is normal. The right ventricular size is normal.  3. Left atrial size was severely dilated.  4. The MV is markedly abnormal. There is moderate calcification and deformity of the posterior leaflet with at least moderate prolapse. There is very severe posteriorly directed MR filling the entire LA. The mitral valve is abnormal. Severe mitral valve  regurgitation. No evidence of mitral stenosis. There is prolapse of multiple scallops of the posterior leaflet of the mitral valve.  5. The aortic valve is normal in structure. Aortic valve regurgitation is not visualized. Aortic valve sclerosis/calcification is present, without any evidence of aortic stenosis.  6. The inferior vena cava is normal in size with greater than 50% respiratory variability, suggesting right atrial pressure of 3 mmHg. Conclusion(s)/Recommendation(s): Suggest TEE to further evalaute the mitral valve. FINDINGS  Left Ventricle: Left ventricular ejection fraction, by estimation, is 60 to 65%. Left ventricular ejection fraction by 3D volume is 62 %. The left ventricle has normal function. The left ventricle has no regional wall motion abnormalities. The average left ventricular global longitudinal strain is -25.9 %. Strain was performed and the global longitudinal strain is normal. The left ventricular internal cavity size was normal in size. There is no left ventricular hypertrophy. Left ventricular diastolic parameters are indeterminate. Right Ventricle: The right ventricular size is normal. No increase in right  ventricular wall thickness. Right ventricular systolic function is normal. Left Atrium: Left atrial size was severely dilated. Right Atrium: Right atrial size was normal in size. Pericardium: There is no evidence of pericardial effusion. Mitral Valve: The MV is markedly abnormal. There is moderate calcification and deformity of the posterior leaflet with at least moderate prolapse. There is very severe posteriorly directed MR filling the entire LA. The mitral valve is abnormal. There is prolapse of multiple scallops of the posterior leaflet of the mitral valve. There is moderate thickening of the mitral valve leaflet(s). There is moderate calcification of the mitral valve leaflet(s). Mild mitral annular calcification. Severe mitral valve regurgitation. No evidence of mitral valve stenosis. Tricuspid Valve: The tricuspid valve is normal in structure. Tricuspid valve regurgitation is mild . No evidence of tricuspid stenosis. Aortic Valve: The aortic valve is normal in structure. Aortic valve regurgitation is not visualized. Aortic valve sclerosis/calcification is present, without any evidence of aortic stenosis. Pulmonic Valve: The pulmonic valve was normal in structure. Pulmonic valve regurgitation is trivial. No evidence of pulmonic stenosis. Aorta: The aortic root is normal in size and structure. Venous: The inferior vena cava is normal in size with greater than 50% respiratory variability, suggesting right atrial pressure of 3 mmHg. IAS/Shunts: No atrial level shunt detected by color flow Doppler. Additional Comments: 3D was performed not requiring image post processing on an independent workstation and was normal.  LEFT VENTRICLE PLAX 2D LVIDd:         5.30 cm         Diastology LV PW:         1.00 cm         LV e' medial:    6.96 cm/s LV IVS:        0.80 cm         LV E/e' medial:  16.7 LVOT diam:     2.00 cm  LV e' lateral:   11.00 cm/s LV SV:         49              LV E/e' lateral: 10.5 LV SV Index:    26 LVOT Area:     3.14 cm        2D                                Longitudinal                                Strain                                2D Strain GLS  -25.9 %                                Avg:                                 3D Volume EF                                LV 3D EF:    Left                                             ventricul                                             ar                                             ejection                                             fraction                                             by 3D                                             volume is                                             62 %.  3D Volume EF:                                3D EF:        62 %                                LV EDV:       151 ml                                LV ESV:       58 ml                                LV SV:        93 ml RIGHT VENTRICLE RV Basal diam:  3.10 cm RV S prime:     12.20 cm/s TAPSE (M-mode): 2.0 cm LEFT ATRIUM             Index        RIGHT ATRIUM           Index LA diam:        4.80 cm 2.57 cm/m   RA Area:     12.10 cm LA Vol (A2C):   90.6 ml 48.54 ml/m  RA Volume:   25.10 ml  13.45 ml/m LA Vol (A4C):   79.9 ml 42.81 ml/m LA Biplane Vol: 92.0 ml 49.29 ml/m  AORTIC VALVE LVOT Vmax:   97.87 cm/s LVOT Vmean:  55.833 cm/s LVOT VTI:    0.155 m  AORTA Ao Root diam: 2.90 cm MITRAL VALVE MV Area (PHT): 4.96 cm       SHUNTS MV Decel Time: 153 msec       Systemic VTI:  0.16 m MR Peak grad:    65.0 mmHg    Systemic Diam: 2.00 cm MR Mean grad:    39.0 mmHg MR Vmax:         403.00 cm/s MR Vmean:        297.0 cm/s MR PISA:         4.02 cm MR PISA Eff ROA: 40 mm MR PISA Radius:  0.80 cm MV E velocity: 116.00 cm/s MV A velocity: 58.00 cm/s MV E/A ratio:  2.00 Arvilla Meres MD Electronically signed by Arvilla Meres MD Signature Date/Time: 10/11/2023/3:18:33 PM    Final    CT Angio Chest PE W and/or Wo Contrast Result Date:  10/06/2023 CLINICAL DATA:  Pulmonary embolism suspected, low to intermediate probability. Positive D-dimer. Patient diagnosed with influenza it for 2 weeks. EXAM: CT ANGIOGRAPHY CHEST WITH CONTRAST TECHNIQUE: Multidetector CT imaging of the chest was performed using the standard protocol during bolus administration of intravenous contrast. Multiplanar CT image reconstructions and MIPs were obtained to evaluate the vascular anatomy. RADIATION DOSE REDUCTION: This exam was performed according to the departmental dose-optimization program which includes automated exposure control, adjustment of the mA and/or kV according to patient size and/or use of iterative reconstruction technique. CONTRAST:  75mL OMNIPAQUE IOHEXOL 350 MG/ML SOLN COMPARISON:  None Available. FINDINGS: Cardiovascular: The heart size is normal. Atherosclerotic calcifications are present at the aortic arch. No aneurysm or stenosis is present. No dissection is present. Pulmonary artery opacification is excellent. No focal filling defects are present to suggest  pulmonary embolus. Pulmonary artery size is normal. Mediastinum/Nodes: No enlarged mediastinal, hilar, or axillary lymph nodes. Thyroid gland, trachea, and esophagus demonstrate no significant findings. Lungs/Pleura: Mild dependent atelectasis is present bilaterally. Bilateral pleural effusions are present, right greater than left. No pneumothorax is present. No focal nodule or mass lesion is present. No airspace disease is present. The airways are patent. Upper Abdomen: The upper abdomen is within normal limits. Musculoskeletal: No chest wall abnormality. No acute or significant osseous findings. Review of the MIP images confirms the above findings. IMPRESSION: 1. No pulmonary embolus. 2. Bilateral pleural effusions, right greater than left. 3. Mild dependent atelectasis bilaterally. Electronically Signed   By: Marin Roberts M.D.   On: 10/06/2023 11:14   DG Chest Portable 1 View Result  Date: 10/06/2023 CLINICAL DATA:  Shortness of breath and chest pain. EXAM: PORTABLE CHEST 1 VIEW COMPARISON:  04/15/2022 FINDINGS: The lungs are clear without focal pneumonia, edema, pneumothorax or pleural effusion. Interstitial markings are diffusely coarsened with chronic features. Cardiopericardial silhouette is at upper limits of normal for size. No acute bony abnormality. IMPRESSION: Chronic interstitial coarsening without acute cardiopulmonary findings. Electronically Signed   By: Kennith Center M.D.   On: 10/06/2023 09:20   Disposition   Pt is being discharged home today in good condition.  Follow-up Plans & Appointments        Discharge Medications   Allergies as of 11/02/2023       Reactions   Aspirin Other (See Comments)   "Due to intestinal issue"   Sulfa Antibiotics Other (See Comments)   bleeding        Medication List     TAKE these medications    acetaminophen 500 MG tablet Commonly known as: TYLENOL Take 1,000 mg by mouth every 6 (six) hours as needed (pain.).   cholecalciferol 25 MCG (1000 UNIT) tablet Commonly known as: VITAMIN D3 Take 1,000 Units by mouth in the morning.   cholestyramine light 4 g packet Commonly known as: PREVALITE Take 4 g by mouth daily. *1/2 packet in morning and 1/2 packet at night*   COSAMIN DS PO Take 1 tablet by mouth daily.   famotidine 40 MG tablet Commonly known as: PEPCID TAKE 1 TABLET(40 MG) BY MOUTH DAILY What changed: See the new instructions.   folic acid 1 MG tablet Commonly known as: FOLVITE Take 1 tablet (1 mg total) by mouth daily. What changed: when to take this   furosemide 20 MG tablet Commonly known as: LASIX Take 1 tablet (20 mg total) by mouth daily.   hydroxyurea 500 MG capsule Commonly known as: HYDREA Take 1 capsule (500 mg total) by mouth 2 (two) times daily. What changed:  when to take this additional instructions   metoprolol tartrate 25 MG tablet Commonly known as: LOPRESSOR Take 1/2  tablet ( 12.5 mg ) twice a day   multivitamin with minerals Tabs tablet Take 1 tablet by mouth in the morning.   PRESERVISION AREDS 2+MULTI VIT PO Take 1 capsule by mouth 2 (two) times daily.   sucralfate 1 g tablet Commonly known as: CARAFATE TAKE 1 TABLET(1 GRAM) BY MOUTH FOUR TIMES DAILY AT BEDTIME WITH MEALS What changed: See the new instructions.   vitamin C 1000 MG tablet Take 1,000 mg by mouth in the morning.         Outstanding Labs/Studies    Duration of Discharge Encounter: APP Time: 35 minutes   Signed, Basilio Cairo, PA-C 11/02/2023, 11:13 AM    Patient seen  and examined. Agree with assessment and plan.  Initially, patient was walking to home.  She did this without shortness of breath development with slow walk.  I had a long talk with the patient.  I reviewed the catheterization data with the patient.  I have recommended continuation of furosemide  at 40 mg which she had increased prior to coming to the hospital.  Apparently there was some confusion regarding whether the patient was to stay in the hospital and see Dr. Nevin Bloodgood on Monday in the hospital or keep her 2:00 appointment in the office which had previously been scheduled.  Apparently she has been put back on the 2:00 appointment in the office to be seen by Dr. Leafy Ro.  I do not believe the patient needs to stay into the hospital for Dr. Karolee Ohs evaluation but I have advised that she not do her typical leg march exercises this weekend but continue to extend and hyperextend her feet due to her concerns for clot formation.  She has essential thrombocytosis with MCV at 118.5 and platelet count 290 on hydroxyurea.  She has significant concerns about bleeding as well as clot formation.  Catheterization did not reveal any coronary artery disease.  Patient will be discharged today with plans to see Dr. Leafy Ro at 2 PM on Monday as previously scheduled in the office with plans for future mitral valve surgery.  DC time  38 minutes Lennette Bihari, MD, [facility] 11/02/2023 11:46 AM

## 2023-11-05 ENCOUNTER — Encounter: Payer: Self-pay | Admitting: Thoracic Surgery (Cardiothoracic Vascular Surgery)

## 2023-11-05 ENCOUNTER — Institutional Professional Consult (permissible substitution) (INDEPENDENT_AMBULATORY_CARE_PROVIDER_SITE_OTHER): Admitting: Thoracic Surgery (Cardiothoracic Vascular Surgery)

## 2023-11-05 ENCOUNTER — Telehealth: Payer: Self-pay | Admitting: Internal Medicine

## 2023-11-05 ENCOUNTER — Encounter: Admitting: Thoracic Surgery (Cardiothoracic Vascular Surgery)

## 2023-11-05 ENCOUNTER — Telehealth: Payer: Self-pay | Admitting: *Deleted

## 2023-11-05 ENCOUNTER — Telehealth: Payer: Self-pay

## 2023-11-05 VITALS — BP 107/67 | HR 82 | Resp 18 | Ht 66.0 in | Wt 139.0 lb

## 2023-11-05 DIAGNOSIS — I340 Nonrheumatic mitral (valve) insufficiency: Secondary | ICD-10-CM | POA: Diagnosis not present

## 2023-11-05 NOTE — Patient Instructions (Signed)
 Evaluate for Mitral clip

## 2023-11-05 NOTE — Progress Notes (Signed)
 [address]       [place] [address]             [phone]           [name] Date of Birth: 1951-08-03  Kristina Nose, MD Kristina Levins, MD  Chief Complaint:   Severe MR  History of Present Illness:     Pt is a nervous 73 yo female who is relatively very active. She walks daily and has a hill at end of her walk that over the past several months she started to have to stop and rest half way. 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 She was medically managed to be able to be discharged. She had a CTA  of chest which ruled out PE and cath with no CAD and moderate PHTN and elevated PCWP. She has a history of significant GI bleed with colonic AVMs and has not had a GI bleed for some time but is very anxious not to receive blood thinners. She had a thymectomy removal by sternotomy at age 73 for hyperplasia. No myesthenia gravis.       Past Medical History:  Diagnosis Date   ABLA (acute blood loss anemia) 04/16/2022   Congenital malformation of intestinal fixation    Gastritis    Gastroesophageal reflux disease without esophagitis 09/10/2021   IDA (iron deficiency anemia)    Mitral valve prolapse    Persistent hyperplasia of thymus (HCC)    S/P Thymectomy   Thrombocytosis     Past Surgical History:  Procedure Laterality Date   BIOPSY  06/26/2022   Procedure: BIOPSY;  Surgeon: Jenel Lucks, MD;  Location: [facility];  Service: Gastroenterology;;   COLONOSCOPY N/A 04/17/2022   Procedure: COLONOSCOPY;  Surgeon: Beverley Fiedler, MD;  Location: [facility];  Service: Gastroenterology;  Laterality: N/A;   COLONOSCOPY WITH PROPOFOL N/A 06/26/2022   Procedure: COLONOSCOPY WITH PROPOFOL;  Surgeon: Jenel Lucks, MD;  Location: [facility];  Service: Gastroenterology;  Laterality: N/A;    ENTEROSCOPY N/A 04/17/2022   Procedure: ENTEROSCOPY;  Surgeon: Beverley Fiedler, MD;  Location: [facility];  Service: Gastroenterology;  Laterality: N/A;   ENTEROSCOPY N/A 06/26/2022   Procedure: ENTEROSCOPY;  Surgeon: Jenel Lucks, MD;  Location: [facility];  Service: Gastroenterology;  Laterality: N/A;   GIVENS CAPSULE STUDY N/A 06/26/2022   Procedure: GIVENS CAPSULE STUDY;  Surgeon: Jenel Lucks, MD;  Location: [facility];  Service: Gastroenterology;  Laterality: N/A;   HEMOSTASIS CLIP PLACEMENT  04/17/2022   Procedure: HEMOSTASIS CLIP PLACEMENT;  Surgeon: Beverley Fiedler, MD;  Location: [facility];  Service: Gastroenterology;;   HEMOSTASIS CLIP PLACEMENT  06/26/2022   Procedure: HEMOSTASIS CLIP PLACEMENT;  Surgeon: Jenel Lucks, MD;  Location: [facility];  Service: Gastroenterology;;   HOT HEMOSTASIS N/A 04/17/2022   Procedure: HOT HEMOSTASIS (ARGON PLASMA COAGULATION/BICAP);  Surgeon: Beverley Fiedler, MD;  Location: [facility];  Service: Gastroenterology;  Laterality: N/A;   HOT HEMOSTASIS N/A 06/26/2022   Procedure: HOT HEMOSTASIS (ARGON PLASMA COAGULATION/BICAP);  Surgeon: Jenel Lucks, MD;  Location: [facility];  Service: Gastroenterology;  Laterality: N/A;   POLYPECTOMY  04/17/2022   Procedure: POLYPECTOMY;  Surgeon: Beverley Fiedler, MD;  Location: [facility];  Service: Gastroenterology;;   POLYPECTOMY  06/26/2022   Procedure: POLYPECTOMY;  Surgeon: Jenel Lucks,  MD;  Location: [facility];  Service: Gastroenterology;;   RIGHT/LEFT HEART CATH AND CORONARY ANGIOGRAPHY N/A 11/01/2023   Procedure: RIGHT/LEFT HEART CATH AND CORONARY ANGIOGRAPHY;  Surgeon: Iran Ouch, MD;  Location: [facility];  Service: Cardiovascular;  Laterality: N/A;   SMALL INTESTINE SURGERY  1994   SUBMUCOSAL TATTOO INJECTION  06/26/2022   Procedure: SUBMUCOSAL TATTOO INJECTION;  Surgeon: Jenel Lucks, MD;  Location: [facility];  Service:  Gastroenterology;;   THYMECTOMY N/A    TRANSESOPHAGEAL ECHOCARDIOGRAM (CATH LAB) N/A 10/24/2023   Procedure: TRANSESOPHAGEAL ECHOCARDIOGRAM;  Surgeon: Kristina Nose, MD;  Location: [facility];  Service: Cardiovascular;  Laterality: N/A;    Social History   Tobacco Use  Smoking Status Former   Current packs/day: 0.00   Types: Cigarettes   Quit date: 2002   Years since quitting: 23.2  Smokeless Tobacco Never  Tobacco Comments   Started smoking in her early 20's.   Smoked 0.5PP at her heaviest.     Social History   Substance and Sexual Activity  Alcohol Use Not Currently    Social History   Socioeconomic History   Marital status: Married    Spouse name: Not on file   Number of children: 0   Years of education: Not on file   Highest education level: Not on file  Occupational History   Not on file  Tobacco Use   Smoking status: Former    Current packs/day: 0.00    Types: Cigarettes    Quit date: 2002    Years since quitting: 23.2   Smokeless tobacco: Never   Tobacco comments:    Started smoking in her early 38's.    Smoked 0.5PP at her heaviest.   Vaping Use   Vaping status: Never Used  Substance and Sexual Activity   Alcohol use: Not Currently   Drug use: Never   Sexual activity: Yes    Partners: Male  Other Topics Concern   Not on file  Social History Narrative   Not on file   Social Drivers of Health   Financial Resource Strain: Not on file  Food Insecurity: No Food Insecurity (11/01/2023)   Hunger Vital Sign    Worried About Running Out of Food in the Last Year: Never true    Ran Out of Food in the Last Year: Never true  Transportation Needs: No Transportation Needs (11/01/2023)   PRAPARE - Administrator, Civil Service (Medical): No    Lack of Transportation (Non-Medical): No  Physical Activity: Not on file  Stress: Not on file  Social Connections: Socially Integrated (11/01/2023)   Social Connection and Isolation Panel [NHANES]     Frequency of Communication with Friends and Family: More than three times a week    Frequency of Social Gatherings with Friends and Family: Once a week    Attends Religious Services: More than 4 times per year    Active Member of [employer] or Organizations: Yes    Attends Engineer, structural: More than 4 times per year    Marital Status: Married  Catering manager Violence: Not At Risk (11/01/2023)   Humiliation, Afraid, Rape, and Kick questionnaire    Fear of Current or Ex-Partner: No    Emotionally Abused: No    Physically Abused: No    Sexually Abused: No    Allergies  Allergen Reactions   Aspirin Other (See Comments)    "Due to intestinal issue"   Sulfa Antibiotics Other (See  Comments)    bleeding    Current Outpatient Medications  Medication Sig Dispense Refill   acetaminophen (TYLENOL) 500 MG tablet Take 1,000 mg by mouth every 6 (six) hours as needed (pain.).     Ascorbic Acid (VITAMIN C) 1000 MG tablet Take 1,000 mg by mouth in the morning.     cholecalciferol (VITAMIN D3) 25 MCG (1000 UNIT) tablet Take 1,000 Units by mouth in the morning.     cholestyramine light (PREVALITE) 4 g packet Take 4 g by mouth daily. *1/2 packet in morning and 1/2 packet at night*     famotidine (PEPCID) 40 MG tablet TAKE 1 TABLET(40 MG) BY MOUTH DAILY (Patient taking differently: Take 40 mg by mouth at bedtime.) 90 tablet 0   folic acid (FOLVITE) 1 MG tablet Take 1 tablet (1 mg total) by mouth daily. (Patient taking differently: Take 1 mg by mouth at bedtime.) 90 tablet 1   furosemide (LASIX) 40 MG tablet Take 1 tablet (40 mg total) by mouth daily. 30 tablet 2   Glucosamine-Chondroitin (COSAMIN DS PO) Take 1 tablet by mouth daily.     hydroxyurea (HYDREA) 500 MG capsule Take 1 capsule (500 mg total) by mouth 2 (two) times daily. (Patient taking differently: Take 500 mg by mouth See admin instructions. Take 1 capsule (500 mg) by mouth in the morning & take 2 capsules (1000 mg) by mouth at  night.) 180 capsule 1   metoprolol tartrate (LOPRESSOR) 25 MG tablet Take 1/2 tablet ( 12.5 mg ) twice a day (Patient taking differently: Take 12.5 mg by mouth 2 (two) times daily as needed (palpitation.).) 90 tablet 3   Multiple Vitamin (MULTIVITAMIN WITH MINERALS) TABS tablet Take 1 tablet by mouth in the morning.     Multiple Vitamins-Minerals (PRESERVISION AREDS 2+MULTI VIT PO) Take 1 capsule by mouth 2 (two) times daily.     sucralfate (CARAFATE) 1 g tablet TAKE 1 TABLET(1 GRAM) BY MOUTH FOUR TIMES DAILY AT BEDTIME WITH MEALS (Patient taking differently: Take 1 g by mouth at bedtime.) 360 tablet 1   No current facility-administered medications for this visit.     Family History  Problem Relation Age of Onset   Heart disease Mother    Valvular heart disease Mother    Healthy Sister    Alcoholism Half-Sister        Physical Exam: LMP  (LMP Unknown)  Lungs: clear  Card: rr with harsh systolic murmur Ext: Warm no edema Neuro; no focal deficits    Diagnostic Studies & Laboratory data: I have personally reviewed the following studies and agree with the findings   Cath (10/2023) Conclusion  1.  Normal coronary arteries. 2.  Left ventricular angiography was not performed.  EF was normal by echo. 3.  Severe mitral annular calcifications. 4.  Right heart catheterization showed moderately elevated wedge pressure, moderate pulmonary hypertension and high cardiac output.  Giant V waves on pulmonary wedge tracing consistent with severe mitral regurgitation.   Flowsheet Row Most Recent Value  Fick Cardiac Output 6.97 L/min  Fick Cardiac Output Index 4.05 (L/min)/BSA  RA A Wave 9 mmHg  RA V Wave 6 mmHg  RA Mean 5 mmHg  RV Systolic Pressure 44 mmHg  RV Diastolic Pressure 2 mmHg  RV EDP 12 mmHg  PA Systolic Pressure 49 mmHg  PA Diastolic Pressure 17 mmHg  PA Mean 31 mmHg  PW A Wave 22 mmHg  PW V Wave 48 mmHg  PW Mean 27 mmHg  AO Systolic Pressure  100 mmHg  AO Diastolic  Pressure 64 mmHg  AO Mean 77 mmHg  LV Systolic Pressure 117 mmHg  LV Diastolic Pressure 5 mmHg  LV EDP 16 mmHg  Arterial Occlusion Pressure Extended Systolic Pressure 104 mmHg  Arterial Occlusion Pressure Extended Diastolic Pressure 71 mmHg  Arterial Occlusion Pressure Extended Mean Pressure 81 mmHg  Left Ventricular Apex Extended Systolic Pressure 108 mmHg  LVp Diastolic Pressure 7 mmHg  Left Ventricular Apex Extended EDP Pressure 23 mmHg  QP/QS 1  TPVR Index 7.65 HRUI  TSVR Index 19 HRUI  PVR SVR Ratio 0.06  TPVR/TSVR Ratio     TEE (09/2023) FINDINGS   Left Ventricle: Left ventricular ejection fraction, by estimation, is 60  to 65%. The left ventricle has normal function. Strain imaging was not  performed. The left ventricular internal cavity size was normal in size.  There is no left ventricular  hypertrophy.   Right Ventricle: The right ventricular size is normal. No increase in  right ventricular wall thickness. Right ventricular systolic function is  normal.   Left Atrium: Left atrial size was severely dilated. No left atrial/left  atrial appendage thrombus was detected.   Right Atrium: Right atrial size was normal in size.   Pericardium: There is no evidence of pericardial effusion.   Mitral Valve: The mitral valve is degenerative in appearance. There is  severe late systolic prolapse of multiple scallops of the posterior  leaflet of the mitral valve. Severe flail of multiple scallops of the  posterior mitral valve leaflet. 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 Severe  mitral valve regurgitation, with eccentric medially directed jet.  Pulmonary venous flow shows systolic flow reversal.   Tricuspid Valve: The tricuspid valve is grossly normal. Tricuspid valve  regurgitation is mild.   Aortic Valve: The aortic valve is tricuspid. Aortic valve regurgitation is  not visualized. No aortic stenosis is present.   Pulmonic  Valve: The pulmonic valve was grossly normal. Pulmonic valve  regurgitation is trivial.   Aorta: The aortic root and ascending aorta are structurally normal, with  no evidence of dilitation. There is minimal (Grade I) plaque involving the  aortic arch.   IAS/Shunts: No atrial level shunt detected by color flow Doppler. Agitated  saline contrast was given intravenously to evaluate for intracardiac  shunting.   Additional Comments: 3D was performed not requiring image post processing  on an independent workstation and was abnormal.   MR Peak grad:    38.2 mmHg  MR Mean grad:    28.0 mmHg  MR Vmax:         309.00 cm/s  MR Vmean:        242.0 cm/s  MR PISA:         6.28 cm  MR PISA Eff ROA: 78 mm  MR PISA Radius:  1.00 cm    Recent Radiology Findings:       Recent Lab Findings: Lab Results  Component Value Date   WBC 5.4 11/02/2023   HGB 12.5 11/02/2023   HCT 37.1 11/02/2023   PLT 290 11/02/2023   GLUCOSE 110 (H) 11/02/2023   ALT 25 10/15/2023   AST 30 10/15/2023   NA 137 11/02/2023   K 3.9 11/02/2023   CL 105 11/02/2023   CREATININE 0.73 11/02/2023   BUN 14 11/02/2023   CO2 24 11/02/2023   TSH 1.488 10/30/2023   INR 1.0 06/24/2022   HGBA1C 4.4 (L) 04/16/2022  Assessment / Plan:     73 yo female with NYHA class 3 symptoms of 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 She has normal LV with no CAD and moderate PHTN. She would need a redo sternotomy and with the severe MAC options for repair limited without ring and also if needs replacement would have to decalcify annulus and with increased risk for dehiscence. Pt understands the surgical issues and will have Dr Lynnette Caffey evaluate for mitral clip since her operative mortality with open surgery significantly elevated.   I have spent 60 min in review of the records, viewing studies and in face to face with patient and in coordination of future care    Eugenio Hoes 11/05/2023 11:04 AM

## 2023-11-05 NOTE — Telephone Encounter (Signed)
 Per [facility] review of 10/24/2023 TEE,  "2 flail segments visualized at medial A2/P2. 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 Eccentric anterior and laterally directed jet. Suggest 2 ACEs medially at medial A2/P2, given gradient is within acceptable range.  Obtain septal height day of procedure."

## 2023-11-05 NOTE — Telephone Encounter (Signed)
 MD called patient, per request

## 2023-11-05 NOTE — Transitions of Care (Post Inpatient/ED Visit) (Signed)
 11/05/2023  Name: Kristina Osborne MRN: [id number] DOB: 04-Nov-1950  Today's TOC FU Call Status: Today's TOC FU Call Status:: Successful TOC FU Call Completed TOC FU Call Complete Date: 11/05/23 Patient's Name and Date of Birth confirmed.  Transition Care Management Follow-up Telephone Call Date of Discharge: 11/02/23 Discharge Facility: [facility]) Type of Discharge: Inpatient Admission Primary Inpatient Discharge Diagnosis:: Severe Mitral Regurgitation How have you been since you were released from the hospital?: Better ("I am okay, still short of breath; the heart surgeon today didn't give me good news; I am going to get a second opinion; I am independent and other than this bad news that they may not be able to do surgery- I am fine; I don't want or need weekly calls") Any questions or concerns?: No  Items Reviewed: Did you receive and understand the discharge instructions provided?: Yes (thoroughly reviewed with patient who verbalizes good understanding of same) Medications obtained,verified, and reconciled?: Yes (Medications Reviewed) (Full medication reconciliation/ review completed; no concerns or discrepancies identified; confirmed no newly Rx'd medications; self-manages medications and denies questions/ concerns around medications today) Any new allergies since your discharge?: No Dietary orders reviewed?: Yes Type of Diet Ordered:: "Heart Healthy, low salt" Do you have support at home?: Yes People in Home: spouse Name of Support/Comfort Primary Source: Reports independent in self-care activities; spouse assists as/ if needed/ indicated  Medications Reviewed Today: Medications Reviewed Today     Reviewed by Michaela Corner, RN (Registered Nurse) on 11/05/23 at 1528  Med List Status: <None>   Medication Order Taking? Sig Documenting Provider Last Dose Status Informant  acetaminophen (TYLENOL) 500 MG tablet [id number] Yes Take 1,000 mg by mouth every 6 (six) hours as  needed (pain.). [provider] Taking Active Self  Ascorbic Acid (VITAMIN C) 1000 MG tablet [id number] Yes Take 1,000 mg by mouth in the morning. [provider] Taking Active Self  cholecalciferol (VITAMIN D3) 25 MCG (1000 UNIT) tablet [id number] Yes Take 1,000 Units by mouth in the morning. [provider] Taking Active Self  cholestyramine light (PREVALITE) 4 g packet [id number] Yes Take 4 g by mouth daily. *1/2 packet in morning and 1/2 packet at night* [provider] Taking Active Self  famotidine (PEPCID) 40 MG tablet [id number] Yes TAKE 1 TABLET(40 MG) BY MOUTH DAILY  Patient taking differently: Take 40 mg by mouth at bedtime.   Jaci Standard, MD Taking Active Self  folic acid (FOLVITE) 1 MG tablet [id number] Yes Take 1 tablet (1 mg total) by mouth daily.  Patient taking differently: Take 1 mg by mouth at bedtime.   Jaci Standard, MD Taking Active Self  furosemide (LASIX) 40 MG tablet [id number] Yes Take 1 tablet (40 mg total) by mouth daily. Arty Baumgartner, NP Taking Active   Glucosamine-Chondroitin [facility] DS PO) [id number] Yes Take 1 tablet by mouth daily. [provider] Taking Active Self  hydroxyurea (HYDREA) 500 MG capsule [id number] Yes Take 1 capsule (500 mg total) by mouth 2 (two) times daily.  Patient taking differently: Take 500 mg by mouth See admin instructions. Take 1 capsule (500 mg) by mouth in the morning & take 2 capsules (1000 mg) by mouth at night.   Jaci Standard, MD Taking Active Self           Med Note Raquel Sarna Oct 18, 2023 12:41 PM)    metoprolol tartrate (LOPRESSOR) 25 MG tablet [id number] No Take 1/2 tablet (  12.5 mg ) twice a day  Patient not taking: Reported on 11/05/2023   [name], MD Not Taking Active Self           Med Note Merlene Morse, KIMBERLY L   Wed Oct 31, 2023 11:32 AM) Pt hasn't been taking lately  Multiple Vitamin (MULTIVITAMIN WITH MINERALS) TABS tablet [id number] Yes Take  1 tablet by mouth in the morning. [provider] Taking Active Self  Multiple Vitamins-Minerals (PRESERVISION AREDS 2+MULTI VIT PO) [id number] Yes Take 1 capsule by mouth 2 (two) times daily. [provider] Taking Active Self  sucralfate (CARAFATE) 1 g tablet [id number] Yes TAKE 1 TABLET(1 GRAM) BY MOUTH FOUR TIMES DAILY AT BEDTIME WITH MEALS  Patient taking differently: Take 1 g by mouth at bedtime.   Jaci Standard, MD Taking Active Self           Home Care and Equipment/Supplies: Were Home Health Services Ordered?: No Any new equipment or medical supplies ordered?: No  Functional Questionnaire: Do you need assistance with bathing/showering or dressing?: No Do you need assistance with meal preparation?: No Do you need assistance with eating?: No Do you have difficulty maintaining continence: No Do you need assistance with getting out of bed/getting out of a chair/moving?: No Do you have difficulty managing or taking your medications?: No  Follow up appointments reviewed: PCP Follow-up appointment confirmed?: NA (verified not indicated per hospital discharging provider discharge notes) MD Provider Line Number:[phone] Given: No (verified well-established with current PCP) Specialist Hospital Follow-up appointment confirmed?: Yes Date of Specialist follow-up appointment?: 11/05/23 Follow-Up Specialty Provider:: Verified attended [facility] surgical provider office visit earlier today; verified aware of with plans to attend scheduled 11/20/23 cardiology provider office visit Do you need transportation to your follow-up appointment?: No Do you understand care options if your condition(s) worsen?: Yes-patient verbalized understanding  SDOH Interventions Today    Flowsheet Row Most Recent Value  SDOH Interventions   Food Insecurity Interventions Intervention Not Indicated  Housing Interventions Intervention Not Indicated  Transportation Interventions Intervention  Not Indicated  [drives self,  family assists as indicated]  Utilities Interventions Intervention Not Indicated      Interventions Today    Flowsheet Row Most Recent Value  Chronic Disease   Chronic disease during today's visit Other  [Severe Mitral regurgitation]  General Interventions   General Interventions Discussed/Reviewed General Interventions Discussed, Durable Medical Equipment (DME), Doctor Visits  Doctor Visits Discussed/Reviewed Doctor Visits Discussed, Specialist  Durable Medical Equipment (DME) Other  [confirmed not currently requiring/ using assistive devices for ambulation]  PCP/Specialist Visits Compliance with follow-up visit  Nutrition Interventions   Nutrition Discussed/Reviewed Nutrition Discussed  Pharmacy Interventions   Pharmacy Dicussed/Reviewed Pharmacy Topics Discussed  [Full medication review with updating medication list in EHR per patient report]      TOC Interventions Today    Flowsheet Row Most Recent Value  TOC Interventions   TOC Interventions Discussed/Reviewed TOC Interventions Discussed  [Patient declines need for ongoing/ further care management outreach,  declines enrollment in 30-day TOC program,  provided my direct contact information should questions/ concerns/ needs arise post-TOC call]      Total time spent from review to signing of note/ including any care coordination interventions: 39 minutes  Pls call/ message for questions,  Caryl Pina, RN, BSN, Media planner  Transitions of Care  [facility] [phone]: direct office

## 2023-11-05 NOTE — Telephone Encounter (Signed)
 Pt requesting cb preferably from Dr Rennis Golden how her surgical consult went this morning.

## 2023-11-05 NOTE — Telephone Encounter (Signed)
Will forward to dr hilty 

## 2023-11-06 ENCOUNTER — Telehealth: Payer: Self-pay

## 2023-11-06 NOTE — Telephone Encounter (Signed)
 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 Anterior leaflet restriction noted. Unclear if patient makes challenging windows or if a more focused bicom/xplane sweep of the valve intra op will provide improved imaging. Study does not show posterior leaflet all the way to the annulus, however, what is visualized measures >1.2cm.  Flail gap in LVOT with color is approximately .3cm. Flail width is visualized well in 3D and suggests a multiple clip case. IC images are difficult to interpret. 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 Anticipate placement of initial clip at most medial torn chord, with a 2nd clip lateral to the first. MVA and gradient not seen in this study and should be measured prior to making final device selection. XTW appropriate.

## 2023-11-08 NOTE — Progress Notes (Unsigned)
 Patient ID: Kristina Osborne MRN: [id number] DOB/AGE: September 29, 1950 73 y.o.  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 The patient is a 73 year old female with the above listed medical problems referred for recommendations regarding her mitral regurgitation.  Patient was recently admitted with acute on chronic diastolic heart failure exacerbation.  A TEE demonstrated severe likely degenerative mitral regurgitation which was confirmed by TEE which I reviewed that looks to show P2 flail.  There is seems to be to areas of flail with a broad jet.  Coronary angiography was reassuring.  Right heart catheterization demonstrated giant V waves to around 48 mmHg with a mean wedge pressure of 27 mmHg.  She was medically stabilized.  Following discharge she met with Dr. Leafy Ro and due to severe mitral annular calcification he did not think that she would be best served by a surgical approach.  She is here to discuss mitral transcatheter edge-to-edge repair.  Recently, she has experienced increased shortness of breath and fatigue, significantly impacting her daily activities. She used to walk 8,000 steps a day and take her dog for walks but now finds it difficult. These symptoms have been present for the past three to four months and have worsened over time. No chest discomfort except for musculoskeletal pain secondary to coughing, which has improved  with Lasix. She experiences arrhythmias when lying on her left side, described as a 'skipping rhythm'.  She has thrombocytosis, diagnosed after an episode of feeling ill with tachycardia and nausea, leading to an ER visit where she was found to have a platelet count of 1.5 million and severe anemia. She was diagnosed with JAK2 mutation and started on hydroxyurea. Initially treated with aspirin due to stroke risk concerns, she developed gastrointestinal bleeding, leading to iron infusions over a year and a half. Her last GI bleed was last year, and she has been advised against taking anticoagulants. She continues to receive iron infusions as needed, with the last one six weeks ago, due to anemia from hydroxyurea use.  She has a history of anemia, likely exacerbated by hydroxyurea treatment and previous GI bleeding. She receives iron infusions as needed, with the last infusion approximately six weeks ago. Currently, she is stable without recent bleeding episodes.  Her mother had a history of mitral valve issues and uncontrolled hypertension, leading to a mitral valve replacement and CABG, but she passed away due to a dissecting aneurysm.  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 She does not smoke.  She denies any dental complaints.     Past Medical History:  Diagnosis Date   ABLA (acute blood loss anemia) 04/16/2022   Congenital malformation of intestinal fixation    Gastritis    Gastroesophageal reflux disease without esophagitis 09/10/2021   IDA (iron deficiency anemia)    Mitral valve prolapse    Persistent hyperplasia of thymus (HCC)    S/P Thymectomy   Thrombocytosis     Past Surgical History:  Procedure Laterality Date   BIOPSY  06/26/2022   Procedure: BIOPSY;  Surgeon: Jenel Lucks, MD;  Location: [facility];  Service: Gastroenterology;;   COLONOSCOPY N/A 04/17/2022   Procedure: COLONOSCOPY;  Surgeon: Beverley Fiedler, MD;  Location: [facility];   Service: Gastroenterology;  Laterality: N/A;   COLONOSCOPY WITH PROPOFOL N/A 06/26/2022   Procedure: COLONOSCOPY WITH PROPOFOL;  Surgeon: Jenel Lucks, MD;  Location: [facility];  Service: Gastroenterology;  Laterality: N/A;   ENTEROSCOPY N/A 04/17/2022   Procedure: ENTEROSCOPY;  Surgeon: Beverley Fiedler, MD;  Location: [facility];  Service: Gastroenterology;  Laterality: N/A;   ENTEROSCOPY N/A 06/26/2022   Procedure: ENTEROSCOPY;  Surgeon: Jenel Lucks, MD;  Location: [facility];  Service: Gastroenterology;  Laterality: N/A;   GIVENS CAPSULE STUDY N/A 06/26/2022   Procedure: GIVENS CAPSULE STUDY;  Surgeon: Jenel Lucks, MD;  Location: [facility];  Service: Gastroenterology;  Laterality: N/A;   HEMOSTASIS CLIP PLACEMENT  04/17/2022   Procedure: HEMOSTASIS CLIP PLACEMENT;  Surgeon: Beverley Fiedler, MD;  Location: [facility];  Service: Gastroenterology;;   HEMOSTASIS CLIP PLACEMENT  06/26/2022   Procedure: HEMOSTASIS CLIP PLACEMENT;  Surgeon: Jenel Lucks, MD;  Location: [facility];  Service: Gastroenterology;;   HOT HEMOSTASIS N/A 04/17/2022   Procedure: HOT HEMOSTASIS (ARGON PLASMA COAGULATION/BICAP);  Surgeon: Beverley Fiedler, MD;  Location: [facility];  Service: Gastroenterology;  Laterality: N/A;   HOT HEMOSTASIS N/A 06/26/2022   Procedure: HOT HEMOSTASIS (ARGON PLASMA COAGULATION/BICAP);  Surgeon: Jenel Lucks, MD;  Location: [facility];  Service: Gastroenterology;  Laterality: N/A;   POLYPECTOMY  04/17/2022   Procedure: POLYPECTOMY;  Surgeon: Beverley Fiedler, MD;  Location: [facility];  Service: Gastroenterology;;   POLYPECTOMY  06/26/2022   Procedure: POLYPECTOMY;  Surgeon: Jenel Lucks, MD;  Location: [facility];  Service: Gastroenterology;;   RIGHT/LEFT HEART CATH AND CORONARY ANGIOGRAPHY N/A 11/01/2023   Procedure: RIGHT/LEFT HEART CATH AND CORONARY ANGIOGRAPHY;  Surgeon: Iran Ouch, MD;  Location: [facility];  Service:  Cardiovascular;  Laterality: N/A;   SMALL INTESTINE SURGERY  1994   SUBMUCOSAL TATTOO INJECTION  06/26/2022   Procedure: SUBMUCOSAL TATTOO INJECTION;  Surgeon: Jenel Lucks, MD;  Location: [facility];  Service: Gastroenterology;;   THYMECTOMY N/A    TRANSESOPHAGEAL ECHOCARDIOGRAM (CATH LAB) N/A 10/24/2023   Procedure: TRANSESOPHAGEAL ECHOCARDIOGRAM;  Surgeon: [name], MD;  Location: [facility];  Service: Cardiovascular;  Laterality: N/A;    Family History  Problem Relation Age of Onset   Heart disease Mother    Valvular heart disease Mother    Healthy Sister    Alcoholism Half-Sister     Social History   Socioeconomic History   Marital status: Married    Spouse name: Not on file   Number of children: 0   Years of education: Not on file   Highest education level: Not on file  Occupational History   Not on file  Tobacco Use   Smoking status: Former    Current packs/day: 0.00    Types: Cigarettes    Quit date: 2002    Years since quitting: 23.2   Smokeless tobacco: Never   Tobacco comments:    Started smoking in her early 42's.    Smoked 0.5PP at her heaviest.   Vaping Use   Vaping status: Never Used  Substance and Sexual Activity   Alcohol use: Not Currently   Drug use: Never   Sexual activity: Yes  Partners: Male  Other Topics Concern   Not on file  Social History Narrative   Not on file   Social Drivers of Health   Financial Resource Strain: Not on file  Food Insecurity: No Food Insecurity (11/05/2023)   Hunger Vital Sign    Worried About Running Out of Food in the Last Year: Never true    Ran Out of Food in the Last Year: Never true  Transportation Needs: No Transportation Needs (11/05/2023)   PRAPARE - Administrator, Civil Service (Medical): No    Lack of Transportation (Non-Medical): No  Physical Activity: Not on file  Stress: Not on file  Social Connections: Socially Integrated (11/01/2023)   Social Connection and  Isolation Panel [NHANES]    Frequency of Communication with Friends and Family: More than three times a week    Frequency of Social Gatherings with Friends and Family: Once a week    Attends Religious Services: More than 4 times per year    Active Member of [employer] or Organizations: Yes    Attends Engineer, structural: More than 4 times per year    Marital Status: Married  Catering manager Violence: Not At Risk (11/05/2023)   Humiliation, Afraid, Rape, and Kick questionnaire    Fear of Current or Ex-Partner: No    Emotionally Abused: No    Physically Abused: No    Sexually Abused: No     Prior to Admission medications   Medication Sig Start Date End Date Taking? Authorizing Provider  acetaminophen (TYLENOL) 500 MG tablet Take 1,000 mg by mouth every 6 (six) hours as needed (pain.).    [provider]  Ascorbic Acid (VITAMIN C) 1000 MG tablet Take 1,000 mg by mouth in the morning.    [provider]  cholecalciferol (VITAMIN D3) 25 MCG (1000 UNIT) tablet Take 1,000 Units by mouth in the morning.    [provider]  cholestyramine light (PREVALITE) 4 g packet Take 4 g by mouth daily. *1/2 packet in morning and 1/2 packet at night*    [provider]  famotidine (PEPCID) 40 MG tablet TAKE 1 TABLET(40 MG) BY MOUTH DAILY Patient taking differently: Take 40 mg by mouth at bedtime. 08/23/23   Jaci Standard, MD  folic acid (FOLVITE) 1 MG tablet Take 1 tablet (1 mg total) by mouth daily. Patient taking differently: Take 1 mg by mouth at bedtime. 05/22/23   Jaci Standard, MD  furosemide (LASIX) 40 MG tablet Take 1 tablet (40 mg total) by mouth daily. 11/02/23   Arty Baumgartner, NP  Glucosamine-Chondroitin (COSAMIN DS PO) Take 1 tablet by mouth daily.    [provider]  hydroxyurea (HYDREA) 500 MG capsule Take 1 capsule (500 mg total) by mouth 2 (two) times daily. Patient taking differently: Take 500 mg by mouth See admin instructions. Take  1 capsule (500 mg) by mouth in the morning & take 2 capsules (1000 mg) by mouth at night. 05/22/23   Jaci Standard, MD  metoprolol tartrate (LOPRESSOR) 25 MG tablet Take 1/2 tablet ( 12.5 mg ) twice a day Patient not taking: Reported on 11/05/2023 10/12/23   [name], MD  Multiple Vitamin (MULTIVITAMIN WITH MINERALS) TABS tablet Take 1 tablet by mouth in the morning.    [provider]  Multiple Vitamins-Minerals (PRESERVISION AREDS 2+MULTI VIT PO) Take 1 capsule by mouth 2 (two) times daily.    [provider]  sucralfate (CARAFATE) 1  g tablet TAKE 1 TABLET(1 GRAM) BY MOUTH FOUR TIMES DAILY AT BEDTIME WITH MEALS Patient taking differently: Take 1 g by mouth at bedtime. 08/31/23   Jaci Standard, MD    Allergies  Allergen Reactions   Aspirin Other (See Comments)    "Due to intestinal issue"   Sulfa Antibiotics Other (See Comments)    bleeding    REVIEW OF SYSTEMS:  General: no fevers/chills/night sweats Eyes: no blurry vision, diplopia, or amaurosis ENT: no sore throat or hearing loss Resp: no cough, wheezing, or hemoptysis CV: no edema or palpitations GI: no abdominal pain, nausea, vomiting, diarrhea, or constipation GU: no dysuria, frequency, or hematuria Skin: no rash Neuro: no headache, numbness, tingling, or weakness of extremities Musculoskeletal: no joint pain or swelling Heme: no bleeding, DVT, or easy bruising Endo: no polydipsia or polyuria  BP 106/72   Pulse 90   Ht 5\' 6"  (1.676 m)   Wt 140 lb (63.5 kg)   LMP  (LMP Unknown)   SpO2 96%   BMI 22.60 kg/m   PHYSICAL EXAM: GEN:  AO x 3 in no acute distress HEENT: normal Dentition: Normal Neck: JVP normal. +2 carotid upstrokes without bruits. No thyromegaly. Lungs: equal expansion, clear bilaterally CV: Apex is discrete and nondisplaced, RRR with 3 out of 6 holosystolic murmur which increases with expiration Abd: soft, non-tender, non-distended; no bruit; positive bowel sounds Ext: no  edema, ecchymoses, or cyanosis Vascular: 2+ femoral pulses, 2+ radial pulses       Skin: warm and dry without rash Neuro: CN II-XII grossly intact; motor and sensory grossly intact    DATA AND STUDIES:  EKG: EKG from March 2025 demonstrates sinus rhythm with left atrial enlargement  EKG Interpretation Date/Time:    Ventricular Rate:    PR Interval:    QRS Duration:    QT Interval:    QTC Calculation:   R Axis:      Text Interpretation:          Cardiac Studies & Procedures   ______________________________________________________________________________________________ CARDIAC CATHETERIZATION  CARDIAC CATHETERIZATION 11/01/2023  Narrative 1.  Normal coronary arteries. 2.  Left ventricular angiography was not performed.  EF was normal by echo. 3.  Severe mitral annular calcifications. 4.  Right heart catheterization showed moderately elevated wedge pressure, moderate pulmonary hypertension and high cardiac output.  Giant V waves on pulmonary wedge tracing consistent with severe mitral regurgitation.  Recommendations: Will diurese with IV Lasix 20 mg twice daily. Recommend surgical consultation for mitral valve repair/replacement.  Findings Coronary Findings Diagnostic  Dominance: Right  Left Main Vessel is angiographically normal.  Left Anterior Descending  First Diagonal Branch Vessel is angiographically normal.  Second Diagonal Branch Vessel is angiographically normal.  Third Diagonal Branch Vessel is angiographically normal.  Left Circumflex Vessel is angiographically normal.  First Obtuse Marginal Branch Vessel is small in size. Vessel is angiographically normal.  Second Obtuse Marginal Branch Vessel is angiographically normal.  Third Obtuse Marginal Branch Vessel is angiographically normal.  Right Coronary Artery Vessel is angiographically normal.  Right Posterior Descending Artery Vessel is angiographically normal.  Right Posterior  Atrioventricular Artery Vessel is angiographically normal.  First Right Posterolateral Branch Vessel is angiographically normal.  Intervention  No interventions have been documented.     ECHOCARDIOGRAM  ECHOCARDIOGRAM COMPLETE 10/11/2023  Narrative ECHOCARDIOGRAM REPORT    Patient Name:   Kristina Osborne Date of Exam: 10/11/2023 Medical Rec #:  [id number]         Height:  66.0 in Accession #:    [id number]        Weight:       170.0 lb Date of Birth:  11-Oct-1950         BSA:          1.866 m Patient Age:    72 years          BP:           122/70 mmHg Patient Gender: F                 HR:           87 bpm. Exam Location:  [address]  Procedure: 2D Echo, 3D Echo and Strain Analysis (Both Spectral and Color Flow Doppler were utilized during procedure).  Indications:    I05.9 Mitral valve disorder  History:        Patient has no prior history of Echocardiogram examinations. Mitral Valve Prolapse, Arrythmias:PVC; Signs/Symptoms:Shortness of Breath. Tachycardia. Palpitations. Pleural effusions.  Sonographer:    Cathie Beams RCS Referring Phys: [id number] CARMEN L GONZALEZ  IMPRESSIONS   1. Left ventricular ejection fraction, by estimation, is 60 to 65%. Left ventricular ejection fraction by 3D volume is 62 %. The left ventricle has normal function. The left ventricle has no regional wall motion abnormalities. Left ventricular diastolic parameters are indeterminate. The average left ventricular global longitudinal strain is -25.9 %. The global longitudinal strain is normal. 2. Right ventricular systolic function is normal. The right ventricular size is normal. 3. Left atrial size was severely dilated. 4. The MV is markedly abnormal. There is moderate calcification and deformity of the posterior leaflet with at least moderate prolapse. There is very severe posteriorly directed MR filling the entire LA. The mitral valve is abnormal. Severe mitral valve regurgitation. No  evidence of mitral stenosis. There is prolapse of multiple scallops of the posterior leaflet of the mitral valve. 5. The aortic valve is normal in structure. Aortic valve regurgitation is not visualized. Aortic valve sclerosis/calcification is present, without any evidence of aortic stenosis. 6. The inferior vena cava is normal in size with greater than 50% respiratory variability, suggesting right atrial pressure of 3 mmHg.  Conclusion(s)/Recommendation(s): Suggest TEE to further evalaute the mitral valve.  FINDINGS Left Ventricle: Left ventricular ejection fraction, by estimation, is 60 to 65%. Left ventricular ejection fraction by 3D volume is 62 %. The left ventricle has normal function. The left ventricle has no regional wall motion abnormalities. The average left ventricular global longitudinal strain is -25.9 %. Strain was performed and the global longitudinal strain is normal. The left ventricular internal cavity size was normal in size. There is no left ventricular hypertrophy. Left ventricular diastolic parameters are indeterminate.  Right Ventricle: The right ventricular size is normal. No increase in right ventricular wall thickness. Right ventricular systolic function is normal.  Left Atrium: Left atrial size was severely dilated.  Right Atrium: Right atrial size was normal in size.  Pericardium: There is no evidence of pericardial effusion.  Mitral Valve: The MV is markedly abnormal. There is moderate calcification and deformity of the posterior leaflet with at least moderate prolapse. There is very severe posteriorly directed MR filling the entire LA. The mitral valve is abnormal. There is prolapse of multiple scallops of the posterior leaflet of the mitral valve. There is moderate thickening of the mitral valve leaflet(s). There is moderate calcification of the mitral valve leaflet(s). Mild mitral annular calcification. Severe mitral valve regurgitation. No evidence of mitral  valve stenosis.  Tricuspid Valve: The tricuspid valve is normal in structure. Tricuspid valve regurgitation is mild . No evidence of tricuspid stenosis.  Aortic Valve: The aortic valve is normal in structure. Aortic valve regurgitation is not visualized. Aortic valve sclerosis/calcification is present, without any evidence of aortic stenosis.  Pulmonic Valve: The pulmonic valve was normal in structure. Pulmonic valve regurgitation is trivial. No evidence of pulmonic stenosis.  Aorta: The aortic root is normal in size and structure.  Venous: The inferior vena cava is normal in size with greater than 50% respiratory variability, suggesting right atrial pressure of 3 mmHg.  IAS/Shunts: No atrial level shunt detected by color flow Doppler.  Additional Comments: 3D was performed not requiring image post processing on an independent workstation and was normal.   LEFT VENTRICLE PLAX 2D LVIDd:         5.30 cm         Diastology LV PW:         1.00 cm         LV e' medial:    6.96 cm/s LV IVS:        0.80 cm         LV E/e' medial:  16.7 LVOT diam:     2.00 cm         LV e' lateral:   11.00 cm/s LV SV:         49              LV E/e' lateral: 10.5 LV SV Index:   26 LVOT Area:     3.14 cm        2D Longitudinal Strain 2D Strain GLS  -25.9 % Avg:  3D Volume EF LV 3D EF:    Left ventricul ar ejection fraction by 3D volume is 62 %.  3D Volume EF: 3D EF:        62 % LV EDV:       151 ml LV ESV:       58 ml LV SV:        93 ml  RIGHT VENTRICLE RV Basal diam:  3.10 cm RV S prime:     12.20 cm/s TAPSE (M-mode): 2.0 cm  LEFT ATRIUM             Index        RIGHT ATRIUM           Index LA diam:        4.80 cm 2.57 cm/m   RA Area:     12.10 cm LA Vol (A2C):   90.6 ml 48.54 ml/m  RA Volume:   25.10 ml  13.45 ml/m LA Vol (A4C):   79.9 ml 42.81 ml/m LA Biplane Vol: 92.0 ml 49.29 ml/m AORTIC VALVE LVOT Vmax:   97.87 cm/s LVOT Vmean:  55.833 cm/s LVOT VTI:    0.155  m  AORTA Ao Root diam: 2.90 cm  MITRAL VALVE MV Area (PHT): 4.96 cm       SHUNTS MV Decel Time: 153 msec       Systemic VTI:  0.16 m MR Peak grad:    65.0 mmHg    Systemic Diam: 2.00 cm MR Mean grad:    39.0 mmHg MR Vmax:         403.00 cm/s MR Vmean:        297.0 cm/s MR PISA:         4.02 cm MR PISA Eff ROA: 40 mm MR  PISA Radius:  0.80 cm MV E velocity: 116.00 cm/s MV A velocity: 58.00 cm/s MV E/A ratio:  2.00  Arvilla Meres MD Electronically signed by Arvilla Meres MD Signature Date/Time: 10/11/2023/3:18:33 PM    Final   TEE  ECHO TEE 10/24/2023  Narrative TRANSESOPHOGEAL ECHO REPORT    Patient Name:   Kristina Osborne Date of Exam: 10/24/2023 Medical Rec #:  [id number]         Height:       66.0 in Accession #:    [id number]        Weight:       146.4 lb Date of Birth:  [birth year]         BSA:          1.751 m Patient Age:    72 years          BP:           104/71 mmHg Patient Gender: F                 HR:           90 bpm. Exam Location:  Inpatient  Procedure: 3D Echo, Transesophageal Echo, Cardiac Doppler, Limited Color Doppler and Saline Contrast Bubble Study (Both Spectral and Color Flow Doppler were utilized during procedure).  Indications:    Mitral Valve Prolapse  History:        Patient has prior history of Echocardiogram examinations, most recent 10/11/2023. Mitral Valve Disease.  Sonographer:    Dondra Prader RVT RCS Referring Phys: [id number] KATLYN D WEST  PROCEDURE: After discussion of the risks and benefits of a TEE, an informed consent was obtained from the patient. The transesophogeal probe was passed without difficulty through the esophogus of the patient. Imaged were obtained with the patient in a supine position. Sedation performed by different physician. The patient developed no complications during the procedure.  IMPRESSIONS   1. Left ventricular ejection fraction, by estimation, is 60 to 65%. The left ventricle has normal  function. 2. Right ventricular systolic function is normal. The right ventricular size is normal. 3. Left atrial size was severely dilated. No left atrial/left atrial appendage thrombus was detected. 4. Severe flail of posterior leaflet. 5. The mitral valve is degenerative. Severe mitral valve regurgitation. There is severe late systolic prolapse of multiple scallops of the posterior leaflet of the mitral valve. 6. The aortic valve is tricuspid. Aortic valve regurgitation is not visualized. No aortic stenosis is present. 7. 3D performed of the mitral valve and demonstrates 3D views to demonstrate flail posterior leaflet and eccentric MR.  Conclusion(s)/Recommendation(s): Severe MR with flail posterior mitral leaflet that may be amenable to repair. Will refer to Dr. Leafy Ro (CT surgery) for further evaluation. Of note, she had prior sternotomy for thymic hyperplasia.  FINDINGS Left Ventricle: Left ventricular ejection fraction, by estimation, is 60 to 65%. The left ventricle has normal function. Strain imaging was not performed. The left ventricular internal cavity size was normal in size. There is no left ventricular hypertrophy.  Right Ventricle: The right ventricular size is normal. No increase in right ventricular wall thickness. Right ventricular systolic function is normal.  Left Atrium: Left atrial size was severely dilated. No left atrial/left atrial appendage thrombus was detected.  Right Atrium: Right atrial size was normal in size.  Pericardium: There is no evidence of pericardial effusion.  Mitral Valve: The mitral valve is degenerative in appearance. There is severe late systolic prolapse of multiple scallops of the posterior leaflet of  the mitral valve. Severe flail of multiple scallops of the posterior mitral valve leaflet. 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 Severe mitral valve regurgitation, with eccentric medially directed jet.  Pulmonary venous flow shows systolic flow reversal.  Tricuspid Valve: The tricuspid valve is grossly normal. Tricuspid valve regurgitation is mild.  Aortic Valve: The aortic valve is tricuspid. Aortic valve regurgitation is not visualized. No aortic stenosis is present.  Pulmonic Valve: The pulmonic valve was grossly normal. Pulmonic valve regurgitation is trivial.  Aorta: The aortic root and ascending aorta are structurally normal, with no evidence of dilitation. There is minimal (Grade I) plaque involving the aortic arch.  IAS/Shunts: No atrial level shunt detected by color flow Doppler. Agitated saline contrast was given intravenously to evaluate for intracardiac shunting.  Additional Comments: 3D was performed not requiring image post processing on an independent workstation and was abnormal.  MR Peak grad:    38.2 mmHg MR Mean grad:    28.0 mmHg MR Vmax:         309.00 cm/s MR Vmean:        242.0 cm/s MR PISA:         6.28 cm MR PISA Eff ROA: 78 mm MR PISA Radius:  1.00 cm  Zoila Shutter MD Electronically signed by Zoila Shutter MD Signature Date/Time: 10/24/2023/1:10:11 PM    Final        ______________________________________________________________________________________________      10/15/2023: ALT 25; Magnesium 1.7 10/30/2023: B Natriuretic Peptide 99.4; TSH 1.488 11/02/2023: BUN 14; Creatinine, Ser 0.73; Hemoglobin 12.5; Platelets 290; Potassium 3.9; Sodium 137   STS RISK CALCULATOR: Pending  NHYA CLASS: 2     ASSESSMENT AND PLAN:   1. Severe mitral regurgitation   2. Hyperlipidemia LDL goal <70   3. Aortic atherosclerosis (HCC)   4. Thrombocytosis     Severe mitral regurgitation: Patient seen surgery and due to mitral annular calcification was thought best served by a nonsurgical approach.  I reviewed the patient's TEE.  She looks to have anatomy feasible for mitral transcatheter edge-to-edge repair though some views are limited.  I would like to  repeat a structural specific TEE to definitively assess her candidacy for mitral transcatheter edge-to-edge repair.  In particular I am concerned about the patient's degree of mitral annular calcification and what her starting gradient would be.  This would certainly affect our clip choice as we would like to avoid iatrogenic mitral stenosis in this very active 73 year old.  She has had issues with aspirin monotherapy in the past due to GI bleeding.  We spoke about antiplatelet therapy after the procedure and she is willing to start Plavix monotherapy after mitral transcatheter edge-to-edge repair. Hyperlipidemia: By virtue of the presence of aortic atherosclerosis for goal LDL is less than 70.  Consider lipid-lowering therapy in the future.   Aortic atherosclerosis: Consider lipid-lowering therapy in the future Thrombocytosis: Continue hydroxyurea 500 mg twice daily.  I have personally reviewed the patients imaging data as summarized above.  I have reviewed the natural history of mitral regurgitation with the patient and family members who are present today. We have discussed the limitations of medical therapy and the poor prognosis associated with symptomatic mitral regurgitation. We have also reviewed potential treatment options, including palliative medical therapy, conventional mitral surgery, and transcatheter mitral edge-to-edge repair. We discussed treatment options in the context of this patient's specific comorbid medical conditions.   All of the patient's questions were answered today. Will make  further recommendations based on the results of studies outlined above.      [name], MD  11/09/2023 9:45 AM    [facility] [address] [place], [place], [place]  [address] Phone: [phone]; Fax: [phone]

## 2023-11-09 ENCOUNTER — Other Ambulatory Visit: Payer: Self-pay

## 2023-11-09 ENCOUNTER — Ambulatory Visit: Attending: Cardiology | Admitting: Internal Medicine

## 2023-11-09 ENCOUNTER — Encounter: Payer: Self-pay | Admitting: Internal Medicine

## 2023-11-09 VITALS — BP 106/72 | HR 90 | Ht 66.0 in | Wt 140.0 lb

## 2023-11-09 DIAGNOSIS — D75839 Thrombocytosis, unspecified: Secondary | ICD-10-CM | POA: Diagnosis present

## 2023-11-09 DIAGNOSIS — I700 Atherosclerosis of aorta: Secondary | ICD-10-CM | POA: Diagnosis present

## 2023-11-09 DIAGNOSIS — I340 Nonrheumatic mitral (valve) insufficiency: Secondary | ICD-10-CM | POA: Insufficient documentation

## 2023-11-09 DIAGNOSIS — E785 Hyperlipidemia, unspecified: Secondary | ICD-10-CM | POA: Insufficient documentation

## 2023-11-09 DIAGNOSIS — I3481 Nonrheumatic mitral (valve) annulus calcification: Secondary | ICD-10-CM

## 2023-11-09 NOTE — Patient Instructions (Signed)
 Testing/Procedures: TEE on the 3/24   Follow-Up: At [facility], you and your health needs are our priority.  As part of our continuing mission to provide you with exceptional heart care, we have created designated Provider Care Teams.  These Care Teams include your primary Cardiologist (physician) and Advanced Practice Providers (APPs -  Physician Assistants and Nurse Practitioners) who all work together to provide you with the care you need, when you need it.   Your next appointment:   Follow up with Dr. Rennis Golden   Other Instructions   1st Floor: - Lobby - Registration  - Pharmacy  - Lab - Cafe  2nd Floor: - PV Lab - Diagnostic Testing (echo, CT, nuclear med)  3rd Floor: - Vacant  4th Floor: - TCTS (cardiothoracic surgery) - AFib Clinic - Structural Heart Clinic - Vascular Surgery  - Vascular Ultrasound  5th Floor: - HeartCare Cardiology (general and EP) - Clinical Pharmacy for coumadin, hypertension, lipid, weight-loss medications, and med management appointments    Valet parking services will be available as well.

## 2023-11-15 ENCOUNTER — Telehealth: Payer: Self-pay

## 2023-11-15 NOTE — Telephone Encounter (Signed)
-----   Message from [name] sent at 11/11/2023  9:32 PM EDT ----- OK, very good.  We can defer the repeat TEE.  Thanks so much! ----- Message ----- From: Thurmon Fair, MD Sent: 11/11/2023   4:55 PM EDT To: [name], MD  Consuella Lose, I pulled up the images of her TEE and was able to measure a mean mitral valve gradient of 2.5 mmHg at 85 bpm.  The data was there, but not measured or reported.  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   Would that information suffice to plan the TEER and save her a repeat TEE? If not, I am glad to do the repeat TEE as scheduled later this month. Mihai ----- Message ----- From: [name], MD Sent: 11/09/2023  12:16 PM EDT To: Thurmon Fair, MD  Hi Rachelle Hora,     I'm referring this patient for repeat TEE.  She has MAC and a MVA and mean gradient were not measured on the last study.  She is quite active so I wanted to completely understand our clip strategy beforehand.  As you know, with MAC, the increase in gradient can be more than we would expect.  FYI and thanks.  AT

## 2023-11-15 NOTE — Telephone Encounter (Signed)
 Spoke with the patient at length. Per Dr. Lynnette Caffey, cancelled 11/19/2023 TEE as Dr. Royann Shivers was able to retrieve sufficient information from previous imaging.  Scheduled the patient for pre-procedure visit with Carlean Jews on 3/31 in preparation for mTEER on 11/28/2023. She was grateful for call and agreed with plan.

## 2023-11-19 ENCOUNTER — Ambulatory Visit (HOSPITAL_COMMUNITY): Admission: RE | Admit: 2023-11-19 | Source: Home / Self Care | Admitting: Cardiovascular Disease

## 2023-11-19 ENCOUNTER — Encounter (HOSPITAL_COMMUNITY): Admission: RE | Payer: Self-pay | Source: Home / Self Care

## 2023-11-19 DIAGNOSIS — I340 Nonrheumatic mitral (valve) insufficiency: Secondary | ICD-10-CM

## 2023-11-19 SURGERY — TRANSESOPHAGEAL ECHOCARDIOGRAM (TEE) (CATHLAB)
Anesthesia: Monitor Anesthesia Care

## 2023-11-20 ENCOUNTER — Other Ambulatory Visit: Payer: Self-pay

## 2023-11-20 ENCOUNTER — Ambulatory Visit: Admitting: Physician Assistant

## 2023-11-20 DIAGNOSIS — I3481 Nonrheumatic mitral (valve) annulus calcification: Secondary | ICD-10-CM

## 2023-11-20 DIAGNOSIS — I340 Nonrheumatic mitral (valve) insufficiency: Secondary | ICD-10-CM

## 2023-11-20 DIAGNOSIS — I341 Nonrheumatic mitral (valve) prolapse: Secondary | ICD-10-CM

## 2023-11-21 ENCOUNTER — Institutional Professional Consult (permissible substitution): Admitting: Internal Medicine

## 2023-11-21 ENCOUNTER — Other Ambulatory Visit: Payer: Self-pay | Admitting: Hematology and Oncology

## 2023-11-23 ENCOUNTER — Telehealth: Payer: Self-pay | Admitting: *Deleted

## 2023-11-23 ENCOUNTER — Telehealth: Payer: Self-pay | Admitting: Internal Medicine

## 2023-11-23 NOTE — Telephone Encounter (Signed)
 Rescheduled appointment per the patients request, due to conflicting appointments. Left the patient a voicemail with the new appointment details. The patient will be mailed an appointment reminder.

## 2023-11-23 NOTE — Telephone Encounter (Signed)
 Received call from pt to let me know that she is having a procedure done to her mitral valve next week-11/28/23 and that she will be on blood thinners afterwards for at least 3 months. 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 She is concerned that she will bleed more. Advised that we will certainly keep track of her CBC and iron studies and treat as indicated. Advised that she will need to let her GI provider know of the situation. Her next appt here is actually on the same day as her heart surgery. Advised that I will have that re-scheduled to the following week. She states she will only be in the hospital overnight.  Scheduling message sent. Dr. Leonides Schanz made aware.

## 2023-11-23 NOTE — Telephone Encounter (Signed)
 Called and spoke to patient about her request to check Iron Levels.  Pt states she does see a Hematologist who manages her Hydroxyurea and her levels have been low in the past.  She is scheduled for: TRANSCATHETER MITRAL EDGE TO EDGE REPAIR on 11/28/2023.  Has pre op appt on 11/26/2023.  She states she may need to be on a blood thinner after her procedure and is worried about low iron levels. Will route to provider and nurse.   Advised pt to ask at her appointment on 11/26/2023 about this.

## 2023-11-23 NOTE — Telephone Encounter (Signed)
 Pt wants to know if we can check her Iron levels when we do blood work. Please advise

## 2023-11-23 NOTE — Progress Notes (Signed)
 STS scoring: Procedure Type: Isolated MVR Perioperative Outcome Estimate % Operative Mortality 1.78% Morbidity & Mortality 7.83% Stroke 1.19% Renal Failure 0.995% Reoperation 3.08% Prolonged Ventilation 3.75% Deep Sternal Wound Infection 0.036% Long Hospital Stay (>14 days) 4.99% Short Hospital Stay (<6 days)* 33.1%  Procedure Type: Isolated MVr Perioperative Outcome Estimate % Operative Mortality 0.738% Morbidity & Mortality 4.08% Stroke 1.12% Renal Failure 0.404% Reoperation 1.96% Prolonged Ventilation 1.58% Deep Sternal Wound Infection 0.026% Long Hospital Stay (>14 days) 2% Short Hospital Stay (<6 days)* 57.8%

## 2023-11-25 NOTE — H&P (View-Only) (Signed)
 [facility]   MULTIDISCIPLINARY HEART VALVE CLINIC                                     Cardiology Office Note:    Date:  11/26/2023   ID:  Kristina Osborne, DOB 1951-02-19, MRN [id number]  PCP:  Corwin Levins, MD  [facility] Cardiologist:  [name], MD  [facility] Structural heart: [name], MD [facility] Electrophysiologist:  None   Referring MD: Corwin Levins, MD   Pre procedure visit   History of Present Illness:    Kristina Osborne is a 73 y.o. 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   She has a history of thrombocytosis, diagnosed after an episode of feeling ill with tachycardia and nausea, leading to an ER visit where she was found to have a platelet count of 1.5 million and severe anemia. She was diagnosed with JAK2 mutation and started on hydroxyurea. Initially treated with aspirin due to stroke risk concerns, she developed gastrointestinal bleeding, leading to iron infusions over a year and a half. Her last GI bleed was last year, and she has been advised against taking anticoagulants. She continues to receive iron infusions as needed due to anemia from hydroxyurea use.  Echocardiogram 10/11/23 showed preserved EF 60-65% with marked abnormality of the mitral valve with moderate calcification and deformity of the posterior leaflet with moderate prolapse and very severely directed MR with filling of the entire LA. TEE 10/24/23 confirmed severe flail of the posterior leaflet and degenerative MV with severe MR. She was admitted 3/4-11/02/23 with acute on chronic diastolic heart failure exacerbation. [facility] 11/01/23 showed normal coronary arteries and giant V waves to around 48 mmHg with a mean  wedge pressure of 27 mmHg. Following discharge she met with Dr. Leafy Ro and due to severe mitral annular calcification, he did not think that she would be best served by a surgical approach and referred her to Dr. Lynnette Caffey for consideration of mTEER. There were plans for a repeat TEE, but this was later cancelled as the necessary information was extracted from the previous study (mean mitral valve gradient of 2.5 mmHg at 85 bpm and clear P2 flail) and she was scheduled for mTEER on 11/28/23. Of note, there were plans to use Plavix monotherapy after mTEER given issues with bleeding and anemia on aspirin in the past.   Today the patient presents to clinic for follow up. Here with her husband. Today just feels a little fatigued with a mild headache but otherwise okay with no ongoing issues. No CP or SOB. No LE edema, orthopnea or PND. No dizziness or syncope. No blood in stool or urine. No palpitations. She asks very good questions about potential outcomes and wanted to make sure it was clear that if something were to go catastrophically wrong, she would like everything done to save her. She also has had some concerns with nosebleeds and a cut from shaving.    Past Medical History:  Diagnosis Date   ABLA (acute blood loss anemia) 04/16/2022   Congenital malformation of intestinal fixation    Gastritis    Gastroesophageal reflux disease without esophagitis 09/10/2021  IDA (iron deficiency anemia)    Mitral valve prolapse    Persistent hyperplasia of thymus (HCC)    S/P Thymectomy   Thrombocytosis      Current Medications: Current Meds  Medication Sig   acetaminophen (TYLENOL) 500 MG tablet Take 1,000 mg by mouth every 6 (six) hours as needed for headache (pain.).   Ascorbic Acid (VITAMIN C) 1000 MG tablet Take 1,000 mg by mouth in the morning.   cholestyramine light (PREVALITE) 4 g packet Take 2 g by mouth 2 (two) times daily.   famotidine (PEPCID) 40 MG tablet TAKE 1 TABLET(40 MG) BY MOUTH DAILY    folic acid (FOLVITE) 1 MG tablet TAKE 1 TABLET(1 MG) BY MOUTH DAILY   furosemide (LASIX) 40 MG tablet Take 1 tablet (40 mg total) by mouth daily.   Glucosamine-Chondroitin (COSAMIN DS PO) Take 1 tablet by mouth daily.   hydroxyurea (HYDREA) 500 MG capsule TAKE 1 CAPSULE(500 MG) BY MOUTH TWICE DAILY   Lycopene 10 MG CAPS Take 10 mg by mouth daily.   MAGNESIUM PO Take 1 tablet by mouth daily. Triple   metoprolol tartrate (LOPRESSOR) 25 MG tablet Take 1/2 tablet ( 12.5 mg ) twice a day   Multiple Vitamin (MULTIVITAMIN WITH MINERALS) TABS tablet Take 1 tablet by mouth in the morning.   Multiple Vitamins-Minerals (PRESERVISION AREDS 2+MULTI VIT PO) Take 1 capsule by mouth 2 (two) times daily.   sucralfate (CARAFATE) 1 g tablet TAKE 1 TABLET(1 GRAM) BY MOUTH FOUR TIMES DAILY AT BEDTIME WITH MEALS (Patient taking differently: Take 1 g by mouth at bedtime.)   VITAMIN D PO Take 5,000 Units by mouth in the morning.      ROS:   Please see the history of present illness.    All other systems reviewed and are negative.  EKGs   EKG Interpretation Date/Time:  Monday November 26 2023 10:40:36 EDT Ventricular Rate:  83 PR Interval:  142 QRS Duration:  90 QT Interval:  396 QTC Calculation: 465 R Axis:   27  Text Interpretation: Normal sinus rhythm Possible Left atrial enlargement Confirmed by Cline Crock [phone]) on 11/26/2023 10:53:52 AM   Risk Assessment/Calculations:           Physical Exam:    VS:  BP 106/60   Pulse 83   Ht 5\' 6"  (1.676 m)   Wt 140 lb (63.5 kg)   LMP  (LMP Unknown)   SpO2 97%   BMI 22.60 kg/m     Wt Readings from Last 3 Encounters:  11/26/23 140 lb (63.5 kg)  11/09/23 140 lb (63.5 kg)  11/05/23 139 lb (63 kg)     GEN: Well nourished, well developed in no acute distress NECK: No JVD CARDIAC: RRR, 4/6 holosystolic  murmur heard best at the apex but audible throughout entire precordium, rubs, gallops RESPIRATORY:  Clear to auscultation without rales,  wheezing or rhonchi  ABDOMEN: Soft, non-tender, non-distended EXTREMITIES:  No edema; No deformity.  ASSESSMENT:    1. Severe mitral regurgitation   2. Chronic heart failure with preserved ejection fraction (HCC)   3. Anemia, unspecified type   4. Thrombocytosis     PLAN:    In order of problems listed above:  Severe mitral regurgitation:  -- Scheduled for mTEER on 11/28/23 with Dr. Lynnette Caffey. -- NYHA class II symptoms, mostly of fatigue and decreased exercise tolerance.  -- CMET, CBC, BNP, INR, ferritin (per pt's request). Pt given soap and instruction letter.    HFpEF: -- Appears euvolemic.  --  Continue Lasix 40mg  daily.  -- CMET today.   Anemia: -- Most recent Hg 12.5 (personally reviewed).  -- Plans for monotherapy with Plavix after mTEER given GI bleeding on aspirin. -- Pt very concerned about potential for bleeding. Will monitor closely perioperatively and after. -- CBC today.   Thrombocytosis: -- Platelets 309 on last recheck (personally reviewed). -- Continue hydroxyurea 500 mg twice daily.  -- CBC today.     Medication Adjustments/Labs and Tests Ordered: Current medicines are reviewed at length with the patient today.  Concerns regarding medicines are outlined above.  Orders Placed This Encounter  Procedures   CBC   Comp Met (CMET)   Protime-INR   Ferritin   Pro b natriuretic peptide (BNP)   EKG 12-Lead   No orders of the defined types were placed in this encounter.   Patient Instructions  Medication Instructions:  Your physician recommends that you continue on your current medications as directed. Please refer to the Current Medication list given to you today.  *If you need a refill on your cardiac medications before your next appointment, please call your pharmacy*  Lab Work: TODAY: CMET, CBC, BNP, INR, FERRITIN  If you have labs (blood work) drawn today and your tests are completely normal, you will receive your results only by: MyChart Message  (if you have MyChart) OR A paper copy in the mail If you have any lab test that is abnormal or we need to change your treatment, we will call you to review the results.  Testing/Procedures: SEE INSTRUCTION LETTER  Follow-Up: At [facility], you and your health needs are our priority.  As part of our continuing mission to provide you with exceptional heart care, our providers are all part of one team.  This team includes your primary Cardiologist (physician) and Advanced Practice Providers or APPs (Physician Assistants and Nurse Practitioners) who all work together to provide you with the care you need, when you need it.  Your next appointment:   KEEP FOLLOW-UP APPOINTMENTS   We recommend signing up for the patient portal called "MyChart".  Sign up information is provided on this After Visit Summary.  MyChart is used to connect with patients for Virtual Visits (Telemedicine).  Patients are able to view lab/test results, encounter notes, upcoming appointments, etc.  Non-urgent messages can be sent to your provider as well.   To learn more about what you can do with MyChart, go to [email or web address].      1st Floor: - Lobby - Registration  - Pharmacy  - Lab - Cafe  2nd Floor: - PV Lab - Diagnostic Testing (echo, CT, nuclear med)  3rd Floor: - Vacant  4th Floor: - TCTS (cardiothoracic surgery) - AFib Clinic - Structural Heart Clinic - Vascular Surgery  - Vascular Ultrasound  5th Floor: - HeartCare Cardiology (general and EP) - Clinical Pharmacy for coumadin, hypertension, lipid, weight-loss medications, and med management appointments    Valet parking services will be available as well.      Signed, Cline Crock, PA-C  11/26/2023 2:16 PM    [facility]

## 2023-11-25 NOTE — Progress Notes (Unsigned)
 [facility]   MULTIDISCIPLINARY HEART VALVE CLINIC                                     Cardiology Office Note:    Date:  11/26/2023   ID:  Kristina Osborne, DOB 1951-02-19, MRN [id number]  PCP:  Corwin Levins, MD  [facility] Cardiologist:  [name], MD  [facility] Structural heart: [name], MD [facility] Electrophysiologist:  None   Referring MD: Corwin Levins, MD   Pre procedure visit   History of Present Illness:    Kristina Osborne is a 73 y.o. 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   She has a history of thrombocytosis, diagnosed after an episode of feeling ill with tachycardia and nausea, leading to an ER visit where she was found to have a platelet count of 1.5 million and severe anemia. She was diagnosed with JAK2 mutation and started on hydroxyurea. Initially treated with aspirin due to stroke risk concerns, she developed gastrointestinal bleeding, leading to iron infusions over a year and a half. Her last GI bleed was last year, and she has been advised against taking anticoagulants. She continues to receive iron infusions as needed due to anemia from hydroxyurea use.  Echocardiogram 10/11/23 showed preserved EF 60-65% with marked abnormality of the mitral valve with moderate calcification and deformity of the posterior leaflet with moderate prolapse and very severely directed MR with filling of the entire LA. TEE 10/24/23 confirmed severe flail of the posterior leaflet and degenerative MV with severe MR. She was admitted 3/4-11/02/23 with acute on chronic diastolic heart failure exacerbation. [facility] 11/01/23 showed normal coronary arteries and giant V waves to around 48 mmHg with a mean  wedge pressure of 27 mmHg. Following discharge she met with Dr. Leafy Ro and due to severe mitral annular calcification, he did not think that she would be best served by a surgical approach and referred her to Dr. Lynnette Caffey for consideration of mTEER. There were plans for a repeat TEE, but this was later cancelled as the necessary information was extracted from the previous study (mean mitral valve gradient of 2.5 mmHg at 85 bpm and clear P2 flail) and she was scheduled for mTEER on 11/28/23. Of note, there were plans to use Plavix monotherapy after mTEER given issues with bleeding and anemia on aspirin in the past.   Today the patient presents to clinic for follow up. Here with her husband. Today just feels a little fatigued with a mild headache but otherwise okay with no ongoing issues. No CP or SOB. No LE edema, orthopnea or PND. No dizziness or syncope. No blood in stool or urine. No palpitations. She asks very good questions about potential outcomes and wanted to make sure it was clear that if something were to go catastrophically wrong, she would like everything done to save her. She also has had some concerns with nosebleeds and a cut from shaving.    Past Medical History:  Diagnosis Date   ABLA (acute blood loss anemia) 04/16/2022   Congenital malformation of intestinal fixation    Gastritis    Gastroesophageal reflux disease without esophagitis 09/10/2021  IDA (iron deficiency anemia)    Mitral valve prolapse    Persistent hyperplasia of thymus (HCC)    S/P Thymectomy   Thrombocytosis      Current Medications: Current Meds  Medication Sig   acetaminophen (TYLENOL) 500 MG tablet Take 1,000 mg by mouth every 6 (six) hours as needed for headache (pain.).   Ascorbic Acid (VITAMIN C) 1000 MG tablet Take 1,000 mg by mouth in the morning.   cholestyramine light (PREVALITE) 4 g packet Take 2 g by mouth 2 (two) times daily.   famotidine (PEPCID) 40 MG tablet TAKE 1 TABLET(40 MG) BY MOUTH DAILY    folic acid (FOLVITE) 1 MG tablet TAKE 1 TABLET(1 MG) BY MOUTH DAILY   furosemide (LASIX) 40 MG tablet Take 1 tablet (40 mg total) by mouth daily.   Glucosamine-Chondroitin (COSAMIN DS PO) Take 1 tablet by mouth daily.   hydroxyurea (HYDREA) 500 MG capsule TAKE 1 CAPSULE(500 MG) BY MOUTH TWICE DAILY   Lycopene 10 MG CAPS Take 10 mg by mouth daily.   MAGNESIUM PO Take 1 tablet by mouth daily. Triple   metoprolol tartrate (LOPRESSOR) 25 MG tablet Take 1/2 tablet ( 12.5 mg ) twice a day   Multiple Vitamin (MULTIVITAMIN WITH MINERALS) TABS tablet Take 1 tablet by mouth in the morning.   Multiple Vitamins-Minerals (PRESERVISION AREDS 2+MULTI VIT PO) Take 1 capsule by mouth 2 (two) times daily.   sucralfate (CARAFATE) 1 g tablet TAKE 1 TABLET(1 GRAM) BY MOUTH FOUR TIMES DAILY AT BEDTIME WITH MEALS (Patient taking differently: Take 1 g by mouth at bedtime.)   VITAMIN D PO Take 5,000 Units by mouth in the morning.      ROS:   Please see the history of present illness.    All other systems reviewed and are negative.  EKGs   EKG Interpretation Date/Time:  Monday November 26 2023 10:40:36 EDT Ventricular Rate:  83 PR Interval:  142 QRS Duration:  90 QT Interval:  396 QTC Calculation: 465 R Axis:   27  Text Interpretation: Normal sinus rhythm Possible Left atrial enlargement Confirmed by Cline Crock [phone]) on 11/26/2023 10:53:52 AM   Risk Assessment/Calculations:           Physical Exam:    VS:  BP 106/60   Pulse 83   Ht 5\' 6"  (1.676 m)   Wt 140 lb (63.5 kg)   LMP  (LMP Unknown)   SpO2 97%   BMI 22.60 kg/m     Wt Readings from Last 3 Encounters:  11/26/23 140 lb (63.5 kg)  11/09/23 140 lb (63.5 kg)  11/05/23 139 lb (63 kg)     GEN: Well nourished, well developed in no acute distress NECK: No JVD CARDIAC: RRR, 4/6 holosystolic  murmur heard best at the apex but audible throughout entire precordium, rubs, gallops RESPIRATORY:  Clear to auscultation without rales,  wheezing or rhonchi  ABDOMEN: Soft, non-tender, non-distended EXTREMITIES:  No edema; No deformity.  ASSESSMENT:    1. Severe mitral regurgitation   2. Chronic heart failure with preserved ejection fraction (HCC)   3. Anemia, unspecified type   4. Thrombocytosis     PLAN:    In order of problems listed above:  Severe mitral regurgitation:  -- Scheduled for mTEER on 11/28/23 with Dr. Lynnette Caffey. -- NYHA class II symptoms, mostly of fatigue and decreased exercise tolerance.  -- CMET, CBC, BNP, INR, ferritin (per pt's request). Pt given soap and instruction letter.    HFpEF: -- Appears euvolemic.  --  Continue Lasix 40mg  daily.  -- CMET today.   Anemia: -- Most recent Hg 12.5 (personally reviewed).  -- Plans for monotherapy with Plavix after mTEER given GI bleeding on aspirin. -- Pt very concerned about potential for bleeding. Will monitor closely perioperatively and after. -- CBC today.   Thrombocytosis: -- Platelets 309 on last recheck (personally reviewed). -- Continue hydroxyurea 500 mg twice daily.  -- CBC today.     Medication Adjustments/Labs and Tests Ordered: Current medicines are reviewed at length with the patient today.  Concerns regarding medicines are outlined above.  Orders Placed This Encounter  Procedures   CBC   Comp Met (CMET)   Protime-INR   Ferritin   Pro b natriuretic peptide (BNP)   EKG 12-Lead   No orders of the defined types were placed in this encounter.   Patient Instructions  Medication Instructions:  Your physician recommends that you continue on your current medications as directed. Please refer to the Current Medication list given to you today.  *If you need a refill on your cardiac medications before your next appointment, please call your pharmacy*  Lab Work: TODAY: CMET, CBC, BNP, INR, FERRITIN  If you have labs (blood work) drawn today and your tests are completely normal, you will receive your results only by: MyChart Message  (if you have MyChart) OR A paper copy in the mail If you have any lab test that is abnormal or we need to change your treatment, we will call you to review the results.  Testing/Procedures: SEE INSTRUCTION LETTER  Follow-Up: At [facility], you and your health needs are our priority.  As part of our continuing mission to provide you with exceptional heart care, our providers are all part of one team.  This team includes your primary Cardiologist (physician) and Advanced Practice Providers or APPs (Physician Assistants and Nurse Practitioners) who all work together to provide you with the care you need, when you need it.  Your next appointment:   KEEP FOLLOW-UP APPOINTMENTS   We recommend signing up for the patient portal called "MyChart".  Sign up information is provided on this After Visit Summary.  MyChart is used to connect with patients for Virtual Visits (Telemedicine).  Patients are able to view lab/test results, encounter notes, upcoming appointments, etc.  Non-urgent messages can be sent to your provider as well.   To learn more about what you can do with MyChart, go to [email or web address].      1st Floor: - Lobby - Registration  - Pharmacy  - Lab - Cafe  2nd Floor: - PV Lab - Diagnostic Testing (echo, CT, nuclear med)  3rd Floor: - Vacant  4th Floor: - TCTS (cardiothoracic surgery) - AFib Clinic - Structural Heart Clinic - Vascular Surgery  - Vascular Ultrasound  5th Floor: - HeartCare Cardiology (general and EP) - Clinical Pharmacy for coumadin, hypertension, lipid, weight-loss medications, and med management appointments    Valet parking services will be available as well.      Signed, Cline Crock, PA-C  11/26/2023 2:16 PM    [facility]

## 2023-11-26 ENCOUNTER — Ambulatory Visit (INDEPENDENT_AMBULATORY_CARE_PROVIDER_SITE_OTHER): Admitting: Physician Assistant

## 2023-11-26 ENCOUNTER — Other Ambulatory Visit: Payer: Self-pay | Admitting: Hematology and Oncology

## 2023-11-26 VITALS — BP 106/60 | HR 83 | Ht 66.0 in | Wt 140.0 lb

## 2023-11-26 DIAGNOSIS — I5032 Chronic diastolic (congestive) heart failure: Secondary | ICD-10-CM | POA: Insufficient documentation

## 2023-11-26 DIAGNOSIS — D649 Anemia, unspecified: Secondary | ICD-10-CM | POA: Insufficient documentation

## 2023-11-26 DIAGNOSIS — I340 Nonrheumatic mitral (valve) insufficiency: Secondary | ICD-10-CM | POA: Insufficient documentation

## 2023-11-26 DIAGNOSIS — D75839 Thrombocytosis, unspecified: Secondary | ICD-10-CM | POA: Insufficient documentation

## 2023-11-26 NOTE — Patient Instructions (Signed)
 Medication Instructions:  Your physician recommends that you continue on your current medications as directed. Please refer to the Current Medication list given to you today.  *If you need a refill on your cardiac medications before your next appointment, please call your pharmacy*  Lab Work: TODAY: CMET, CBC, BNP, INR, FERRITIN  If you have labs (blood work) drawn today and your tests are completely normal, you will receive your results only by: MyChart Message (if you have MyChart) OR A paper copy in the mail If you have any lab test that is abnormal or we need to change your treatment, we will call you to review the results.  Testing/Procedures: SEE INSTRUCTION LETTER  Follow-Up: At [facility], you and your health needs are our priority.  As part of our continuing mission to provide you with exceptional heart care, our providers are all part of one team.  This team includes your primary Cardiologist (physician) and Advanced Practice Providers or APPs (Physician Assistants and Nurse Practitioners) who all work together to provide you with the care you need, when you need it.  Your next appointment:   KEEP FOLLOW-UP APPOINTMENTS   We recommend signing up for the patient portal called "MyChart".  Sign up information is provided on this After Visit Summary.  MyChart is used to connect with patients for Virtual Visits (Telemedicine).  Patients are able to view lab/test results, encounter notes, upcoming appointments, etc.  Non-urgent messages can be sent to your provider as well.   To learn more about what you can do with MyChart, go to [email or web address].      1st Floor: - Lobby - Registration  - Pharmacy  - Lab - Cafe  2nd Floor: - PV Lab - Diagnostic Testing (echo, CT, nuclear med)  3rd Floor: - Vacant  4th Floor: - TCTS (cardiothoracic surgery) - AFib Clinic - Structural Heart Clinic - Vascular Surgery  - Vascular Ultrasound  5th Floor: - HeartCare  Cardiology (general and EP) - Clinical Pharmacy for coumadin, hypertension, lipid, weight-loss medications, and med management appointments    Valet parking services will be available as well.

## 2023-11-27 LAB — CBC
Hematocrit: 36.8 % (ref 34.0–46.6)
Hemoglobin: 13.3 g/dL (ref 11.1–15.9)
MCH: 43.2 pg — ABNORMAL HIGH (ref 26.6–33.0)
MCHC: 36.1 g/dL — ABNORMAL HIGH (ref 31.5–35.7)
MCV: 120 fL — ABNORMAL HIGH (ref 79–97)
Platelets: 681 10*3/uL — ABNORMAL HIGH (ref 150–450)
RBC: 3.08 x10E6/uL — ABNORMAL LOW (ref 3.77–5.28)
RDW: 15.4 % (ref 11.7–15.4)
WBC: 3.9 10*3/uL (ref 3.4–10.8)

## 2023-11-27 LAB — PROTIME-INR
INR: 1.1 (ref 0.9–1.2)
Prothrombin Time: 12.5 s — ABNORMAL HIGH (ref 9.1–12.0)

## 2023-11-27 LAB — COMPREHENSIVE METABOLIC PANEL WITH GFR
ALT: 19 IU/L (ref 0–32)
AST: 25 IU/L (ref 0–40)
Albumin: 4.8 g/dL (ref 3.8–4.8)
Alkaline Phosphatase: 83 IU/L (ref 44–121)
BUN/Creatinine Ratio: 20 (ref 12–28)
BUN: 17 mg/dL (ref 8–27)
Bilirubin Total: 0.4 mg/dL (ref 0.0–1.2)
CO2: 26 mmol/L (ref 20–29)
Calcium: 10.3 mg/dL (ref 8.7–10.3)
Chloride: 100 mmol/L (ref 96–106)
Creatinine, Ser: 0.85 mg/dL (ref 0.57–1.00)
Globulin, Total: 1.9 g/dL (ref 1.5–4.5)
Glucose: 96 mg/dL (ref 70–99)
Potassium: 5.3 mmol/L — ABNORMAL HIGH (ref 3.5–5.2)
Sodium: 139 mmol/L (ref 134–144)
Total Protein: 6.7 g/dL (ref 6.0–8.5)
eGFR: 73 mL/min/{1.73_m2} (ref >59)

## 2023-11-27 LAB — FERRITIN: Ferritin: 346 ng/mL — ABNORMAL HIGH (ref 15–150)

## 2023-11-27 LAB — PRO B NATRIURETIC PEPTIDE: NT-Pro BNP: 474 pg/mL — ABNORMAL HIGH (ref 0–301)

## 2023-11-27 NOTE — Anesthesia Preprocedure Evaluation (Signed)
 Anesthesia Evaluation  Patient identified by MRN, date of birth, ID band Patient awake    Reviewed: Allergy & Precautions, NPO status , Patient's Chart, lab work & pertinent test results, reviewed documented beta blocker date and time   History of Anesthesia Complications Negative for: history of anesthetic complications  Airway Mallampati: I  TM Distance: >3 FB Neck ROM: Full    Dental  (+) Dental Advisory Given   Pulmonary former smoker   breath sounds clear to auscultation       Cardiovascular hypertension, Pt. on medications and Pt. on home beta blockers (-) angina + Valvular Problems/Murmurs MR and MVP  Rhythm:Regular Rate:Normal + Systolic murmurs 08/6107 Cath: normal coronaries, EF normal, Giant V waves consistent with severe MR  09/2023 ECHO: EF 60-65%, normal LVF, normal RVF, severe MR with flail posterior leaflet   Neuro/Psych  Headaches    GI/Hepatic Neg liver ROS,GERD  Medicated and Controlled,,  Endo/Other  negative endocrine ROS    Renal/GU negative Renal ROS     Musculoskeletal   Abdominal   Peds  Hematology Hb 13.3, plt 681k   Anesthesia Other Findings   Reproductive/Obstetrics                             Anesthesia Physical Anesthesia Plan  ASA: 4  Anesthesia Plan: General   Post-op Pain Management: Tylenol PO (pre-op)*   Induction: Intravenous  PONV Risk Score and Plan: 3 and Ondansetron, Dexamethasone and Treatment may vary due to age or medical condition  Airway Management Planned: Oral ETT  Additional Equipment: Arterial line and None  Intra-op Plan:   Post-operative Plan: Extubation in OR  Informed Consent: I have reviewed the patients History and Physical, chart, labs and discussed the procedure including the risks, benefits and alternatives for the proposed anesthesia with the patient or authorized representative who has indicated his/her understanding and  acceptance.     Dental advisory given  Plan Discussed with: CRNA and Surgeon  Anesthesia Plan Comments:         Anesthesia Quick Evaluation

## 2023-11-27 NOTE — Addendum Note (Signed)
 Addended by: Gunnar Fusi A on: 11/27/2023 10:28 AM   Modules accepted: Orders

## 2023-11-28 ENCOUNTER — Inpatient Hospital Stay (HOSPITAL_COMMUNITY)

## 2023-11-28 ENCOUNTER — Inpatient Hospital Stay (HOSPITAL_COMMUNITY): Payer: Self-pay | Admitting: Certified Registered Nurse Anesthetist

## 2023-11-28 ENCOUNTER — Other Ambulatory Visit: Payer: Self-pay

## 2023-11-28 ENCOUNTER — Encounter (HOSPITAL_COMMUNITY): Payer: Self-pay | Admitting: Internal Medicine

## 2023-11-28 ENCOUNTER — Inpatient Hospital Stay: Payer: Medicare Other | Admitting: Hematology and Oncology

## 2023-11-28 ENCOUNTER — Inpatient Hospital Stay: Payer: Medicare Other

## 2023-11-28 ENCOUNTER — Encounter (HOSPITAL_COMMUNITY)
Admission: RE | Disposition: A | Discharge status: Home / Self Care | Payer: Self-pay | Source: Home / Self Care | Attending: Internal Medicine

## 2023-11-28 ENCOUNTER — Inpatient Hospital Stay (HOSPITAL_COMMUNITY)
Admission: RE | Admit: 2023-11-28 | Discharge: 2023-11-29 | DRG: 266 | Disposition: A | Discharge status: Home / Self Care | Attending: Internal Medicine | Admitting: Internal Medicine

## 2023-11-28 DIAGNOSIS — Z882 Allergy status to sulfonamides status: Secondary | ICD-10-CM

## 2023-11-28 DIAGNOSIS — K5520 Angiodysplasia of colon without hemorrhage: Secondary | ICD-10-CM | POA: Diagnosis present

## 2023-11-28 DIAGNOSIS — I340 Nonrheumatic mitral (valve) insufficiency: Principal | ICD-10-CM

## 2023-11-28 DIAGNOSIS — K219 Gastro-esophageal reflux disease without esophagitis: Secondary | ICD-10-CM | POA: Diagnosis present

## 2023-11-28 DIAGNOSIS — Z006 Encounter for examination for normal comparison and control in clinical research program: Secondary | ICD-10-CM

## 2023-11-28 DIAGNOSIS — Z9889 Other specified postprocedural states: Principal | ICD-10-CM

## 2023-11-28 DIAGNOSIS — D75839 Thrombocytosis, unspecified: Secondary | ICD-10-CM | POA: Diagnosis present

## 2023-11-28 DIAGNOSIS — Z7902 Long term (current) use of antithrombotics/antiplatelets: Secondary | ICD-10-CM

## 2023-11-28 DIAGNOSIS — I341 Nonrheumatic mitral (valve) prolapse: Secondary | ICD-10-CM

## 2023-11-28 DIAGNOSIS — Z886 Allergy status to analgesic agent status: Secondary | ICD-10-CM | POA: Diagnosis not present

## 2023-11-28 DIAGNOSIS — I5033 Acute on chronic diastolic (congestive) heart failure: Secondary | ICD-10-CM

## 2023-11-28 DIAGNOSIS — I5030 Unspecified diastolic (congestive) heart failure: Secondary | ICD-10-CM | POA: Diagnosis present

## 2023-11-28 DIAGNOSIS — Z87891 Personal history of nicotine dependence: Secondary | ICD-10-CM

## 2023-11-28 DIAGNOSIS — I3481 Nonrheumatic mitral (valve) annulus calcification: Secondary | ICD-10-CM

## 2023-11-28 DIAGNOSIS — K922 Gastrointestinal hemorrhage, unspecified: Secondary | ICD-10-CM | POA: Diagnosis present

## 2023-11-28 DIAGNOSIS — E785 Hyperlipidemia, unspecified: Secondary | ICD-10-CM | POA: Diagnosis present

## 2023-11-28 DIAGNOSIS — Z954 Presence of other heart-valve replacement: Secondary | ICD-10-CM | POA: Diagnosis not present

## 2023-11-28 DIAGNOSIS — Z95818 Presence of other cardiac implants and grafts: Principal | ICD-10-CM

## 2023-11-28 DIAGNOSIS — I110 Hypertensive heart disease with heart failure: Secondary | ICD-10-CM

## 2023-11-28 HISTORY — PX: TRANSCATHETER MITRAL EDGE TO EDGE REPAIR: CATH118311

## 2023-11-28 HISTORY — PX: TRANSESOPHAGEAL ECHOCARDIOGRAM (CATH LAB): EP1270

## 2023-11-28 HISTORY — DX: Other specified postprocedural states: Z98.890

## 2023-11-28 LAB — ECHO TEE
MV M vel: 4.23 m/s
MV Peak grad: 71.6 mmHg
MV VTI: 1.1 cm2
Radius: 1.2 cm

## 2023-11-28 LAB — TYPE AND SCREEN
ABO/RH(D): A POS
Antibody Screen: NEGATIVE

## 2023-11-28 LAB — BASIC METABOLIC PANEL WITH GFR
Anion gap: 8 (ref 5–15)
BUN: 15 mg/dL (ref 8–23)
CO2: 26 mmol/L (ref 22–32)
Calcium: 9.7 mg/dL (ref 8.9–10.3)
Chloride: 106 mmol/L (ref 98–111)
Creatinine, Ser: 0.79 mg/dL (ref 0.44–1.00)
GFR, Estimated: >60 mL/min (ref >60)
Glucose, Bld: 114 mg/dL — ABNORMAL HIGH (ref 70–99)
Potassium: 3.7 mmol/L (ref 3.5–5.1)
Sodium: 140 mmol/L (ref 135–145)

## 2023-11-28 LAB — ABO/RH: ABO/RH(D): A POS

## 2023-11-28 LAB — POCT ACTIVATED CLOTTING TIME
Activated Clotting Time: 210 s
Activated Clotting Time: 227 s
Activated Clotting Time: 250 s
Activated Clotting Time: 262 s

## 2023-11-28 LAB — SURGICAL PCR SCREEN
MRSA, PCR: NEGATIVE
Staphylococcus aureus: NEGATIVE

## 2023-11-28 SURGERY — MITRAL VALVE REPAIR
Anesthesia: General

## 2023-11-28 MED ORDER — CHLORHEXIDINE GLUCONATE 4 % EX SOLN: 60.0000 mL | Freq: Once | CUTANEOUS | Status: DC

## 2023-11-28 MED ORDER — SODIUM CHLORIDE 0.9% FLUSH
3.0000 mL | Freq: Two times a day (BID) | INTRAVENOUS | Status: DC
  Administered 2023-11-28 – 2023-11-29 (×3): 3 mL via INTRAVENOUS

## 2023-11-28 MED ORDER — SUCRALFATE 1 G PO TABS
1.0000 g | ORAL_TABLET | Freq: Every day | ORAL | Status: DC
  Administered 2023-11-28: 1 g via ORAL
  Filled 2023-11-28: qty 1

## 2023-11-28 MED ORDER — SODIUM CHLORIDE 0.9 % IV SOLN: INTRAVENOUS | Status: DC

## 2023-11-28 MED ORDER — FENTANYL CITRATE (PF) 100 MCG/2ML IJ SOLN
INTRAMUSCULAR | Status: AC
Start: 2023-11-28 — End: 2023-11-28
  Filled 2023-11-28: qty 2

## 2023-11-28 MED ORDER — HYDROXYUREA 500 MG PO CAPS
500.0000 mg | ORAL_CAPSULE | Freq: Two times a day (BID) | ORAL | Status: DC
  Administered 2023-11-28 – 2023-11-29 (×3): 500 mg via ORAL
  Filled 2023-11-28 (×3): qty 1

## 2023-11-28 MED ORDER — FAMOTIDINE 20 MG PO TABS
40.0000 mg | ORAL_TABLET | Freq: Every day | ORAL | Status: DC
  Administered 2023-11-28 – 2023-11-29 (×2): 40 mg via ORAL
  Filled 2023-11-28 (×2): qty 2

## 2023-11-28 MED ORDER — OXYCODONE HCL 5 MG/5ML PO SOLN: 5.0000 mg | Freq: Once | ORAL | Status: AC | PRN

## 2023-11-28 MED ORDER — EPHEDRINE SULFATE-NACL 50-0.9 MG/10ML-% IV SOSY
PREFILLED_SYRINGE | INTRAVENOUS | Status: DC | PRN
  Administered 2023-11-28 (×2): 5 mg via INTRAVENOUS

## 2023-11-28 MED ORDER — ACETAMINOPHEN 500 MG PO TABS
1000.0000 mg | ORAL_TABLET | Freq: Once | ORAL | Status: AC
  Administered 2023-11-28: 1000 mg via ORAL
  Filled 2023-11-28: qty 2

## 2023-11-28 MED ORDER — SUGAMMADEX SODIUM 200 MG/2ML IV SOLN
INTRAVENOUS | Status: DC | PRN
  Administered 2023-11-28: 200 mg via INTRAVENOUS

## 2023-11-28 MED ORDER — CHLORHEXIDINE GLUCONATE 0.12 % MT SOLN
15.0000 mL | Freq: Once | OROMUCOSAL | Status: AC
  Administered 2023-11-28: 15 mL via OROMUCOSAL
  Filled 2023-11-28: qty 15

## 2023-11-28 MED ORDER — SODIUM CHLORIDE 0.9 % IV SOLN: 250.0000 mL | INTRAVENOUS | Status: DC | PRN

## 2023-11-28 MED ORDER — SODIUM CHLORIDE 0.9% FLUSH: 3.0000 mL | INTRAVENOUS | Status: DC | PRN

## 2023-11-28 MED ORDER — FOLIC ACID 1 MG PO TABS
1.0000 mg | ORAL_TABLET | Freq: Every day | ORAL | Status: DC
  Administered 2023-11-28 – 2023-11-29 (×2): 1 mg via ORAL
  Filled 2023-11-28 (×2): qty 1

## 2023-11-28 MED ORDER — CLOPIDOGREL BISULFATE 75 MG PO TABS
75.0000 mg | ORAL_TABLET | Freq: Every day | ORAL | Status: DC
  Administered 2023-11-29: 75 mg via ORAL
  Filled 2023-11-28: qty 1

## 2023-11-28 MED ORDER — HEPARIN (PORCINE) IN NACL 2000-0.9 UNIT/L-% IV SOLN
INTRAVENOUS | Status: DC | PRN
  Administered 2023-11-28: 2000 mL via SURGICAL_CAVITY

## 2023-11-28 MED ORDER — FENTANYL CITRATE (PF) 250 MCG/5ML IJ SOLN
INTRAMUSCULAR | Status: DC | PRN
  Administered 2023-11-28: 100 ug via INTRAVENOUS

## 2023-11-28 MED ORDER — HEPARIN SODIUM (PORCINE) 1000 UNIT/ML IJ SOLN
INTRAMUSCULAR | Status: DC | PRN
  Administered 2023-11-28 (×2): 3000 [IU] via INTRAVENOUS
  Administered 2023-11-28: 9000 [IU] via INTRAVENOUS
  Administered 2023-11-28: 5000 [IU] via INTRAVENOUS

## 2023-11-28 MED ORDER — PROPOFOL 10 MG/ML IV BOLUS
INTRAVENOUS | Status: DC | PRN
Start: 2023-11-28 — End: 2023-11-28
  Administered 2023-11-28: 100 mg via INTRAVENOUS

## 2023-11-28 MED ORDER — OXYCODONE HCL 5 MG PO TABS
ORAL_TABLET | ORAL | Status: AC
  Filled 2023-11-28: qty 1

## 2023-11-28 MED ORDER — HEPARIN (PORCINE) IN NACL 1000-0.9 UT/500ML-% IV SOLN
INTRAVENOUS | Status: DC | PRN
  Administered 2023-11-28: 500 mL

## 2023-11-28 MED ORDER — CEFAZOLIN SODIUM-DEXTROSE 2-4 GM/100ML-% IV SOLN
2.0000 g | INTRAVENOUS | Status: AC
Start: 2023-11-28 — End: 2023-11-28
  Administered 2023-11-28: 2 g via INTRAVENOUS
  Filled 2023-11-28: qty 100

## 2023-11-28 MED ORDER — ACETAMINOPHEN 325 MG PO TABS
650.0000 mg | ORAL_TABLET | ORAL | Status: DC | PRN
  Administered 2023-11-28 – 2023-11-29 (×3): 650 mg via ORAL
  Filled 2023-11-28 (×3): qty 2

## 2023-11-28 MED ORDER — FENTANYL CITRATE (PF) 100 MCG/2ML IJ SOLN
25.0000 ug | INTRAMUSCULAR | Status: DC | PRN
  Administered 2023-11-28 (×2): 50 ug via INTRAVENOUS

## 2023-11-28 MED ORDER — FENTANYL CITRATE (PF) 100 MCG/2ML IJ SOLN
INTRAMUSCULAR | Status: AC
  Filled 2023-11-28: qty 2

## 2023-11-28 MED ORDER — MIDAZOLAM HCL 2 MG/2ML IJ SOLN
INTRAMUSCULAR | Status: AC
  Filled 2023-11-28: qty 2

## 2023-11-28 MED ORDER — LIDOCAINE 2% (20 MG/ML) 5 ML SYRINGE
INTRAMUSCULAR | Status: DC | PRN
  Administered 2023-11-28: 20 mg via INTRAVENOUS

## 2023-11-28 MED ORDER — DEXAMETHASONE SODIUM PHOSPHATE 10 MG/ML IJ SOLN
INTRAMUSCULAR | Status: DC | PRN
  Administered 2023-11-28: 10 mg via INTRAVENOUS

## 2023-11-28 MED ORDER — LABETALOL HCL 5 MG/ML IV SOLN: 10.0000 mg | INTRAVENOUS | Status: DC | PRN

## 2023-11-28 MED ORDER — OXYCODONE HCL 5 MG PO TABS
5.0000 mg | ORAL_TABLET | Freq: Once | ORAL | Status: AC | PRN
  Administered 2023-11-28: 5 mg via ORAL
  Filled 2023-11-28: qty 1

## 2023-11-28 MED ORDER — HYDRALAZINE HCL 20 MG/ML IJ SOLN: 5.0000 mg | INTRAMUSCULAR | Status: DC | PRN

## 2023-11-28 MED ORDER — ONDANSETRON HCL 4 MG/2ML IJ SOLN
4.0000 mg | Freq: Four times a day (QID) | INTRAMUSCULAR | Status: DC | PRN
  Administered 2023-11-29: 4 mg via INTRAVENOUS
  Filled 2023-11-28: qty 2

## 2023-11-28 MED ORDER — NOREPINEPHRINE 4 MG/250ML-% IV SOLN
INTRAVENOUS | Status: DC | PRN
  Administered 2023-11-28: 2 ug/min via INTRAVENOUS

## 2023-11-28 MED ORDER — LACTATED RINGERS IV SOLN: INTRAVENOUS | Status: DC | PRN

## 2023-11-28 MED ORDER — MIDAZOLAM HCL 2 MG/2ML IJ SOLN
INTRAMUSCULAR | Status: DC | PRN
  Administered 2023-11-28: 2 mg via INTRAVENOUS

## 2023-11-28 MED ORDER — ONDANSETRON HCL 4 MG/2ML IJ SOLN
INTRAMUSCULAR | Status: DC | PRN
  Administered 2023-11-28: 4 mg via INTRAVENOUS

## 2023-11-28 MED ORDER — ROCURONIUM BROMIDE 10 MG/ML (PF) SYRINGE
PREFILLED_SYRINGE | INTRAVENOUS | Status: DC | PRN
  Administered 2023-11-28 (×2): 10 mg via INTRAVENOUS
  Administered 2023-11-28: 60 mg via INTRAVENOUS
  Administered 2023-11-28: 10 mg via INTRAVENOUS

## 2023-11-28 MED ORDER — OXYCODONE HCL 5 MG PO TABS
5.0000 mg | ORAL_TABLET | Freq: Once | ORAL | Status: AC | PRN
  Administered 2023-11-28: 5 mg via ORAL

## 2023-11-28 SURGICAL SUPPLY — 18 items
CATH MITRA STEERABLE GUIDE (CATHETERS) IMPLANT
CLIP MITRA G4 DELIVERY SYS NTW (Clip) IMPLANT
CLIP MITRA G4 DELIVERY SYS XTW (Clip) IMPLANT
CLOSURE PERCLOSE PROSTYLE (VASCULAR PRODUCTS) IMPLANT
HEMOSTAT KELLY 5.5 SS STRL (MISCELLANEOUS) IMPLANT
KIT HEART LEFT (KITS) ×2 IMPLANT
KIT VERSACROSS LRG ACCESS (CATHETERS) IMPLANT
PACK CARDIAC CATHETERIZATION (CUSTOM PROCEDURE TRAY) ×1 IMPLANT
SHEATH DILAT COONS TAPER 22F (SHEATH) IMPLANT
SHEATH PINNACLE 8F 10CM (SHEATH) IMPLANT
SHEATH PROBE COVER 6X72 (BAG) ×1 IMPLANT
STOPCOCK MORSE 400PSI 3WAY (MISCELLANEOUS) ×6 IMPLANT
SYSTEM MITRACLIP G4 (SYSTAGENIX WOUND MANAGEMENT) IMPLANT
TRANSDUCER W/STOPCOCK (MISCELLANEOUS) ×1 IMPLANT
TUBING ART PRESS 72 MALE/FEM (TUBING) ×1 IMPLANT
TUBING CIL FLEX 10 FLL-RA (TUBING) IMPLANT
WIRE EMERALD 3MM-J .035X150CM (WIRE) IMPLANT
WIRE MICRO SET SILHO 5FR 7 (SHEATH) IMPLANT

## 2023-11-28 NOTE — Anesthesia Postprocedure Evaluation (Signed)
 Anesthesia Post Note  Patient: Kristina Osborne  Procedure(s) Performed: TRANSCATHETER MITRAL EDGE TO EDGE REPAIR TRANSESOPHAGEAL ECHOCARDIOGRAM     Patient location during evaluation: Nursing Unit Anesthesia Type: General Level of consciousness: awake and alert, patient cooperative and oriented Pain management: pain level controlled Vital Signs Assessment: post-procedure vital signs reviewed and stable Respiratory status: nonlabored ventilation, spontaneous breathing and respiratory function stable Cardiovascular status: blood pressure returned to baseline and stable Postop Assessment: no apparent nausea or vomiting and adequate PO intake Anesthetic complications: no   There were no known notable events for this encounter.  Last Vitals:  Vitals:   11/28/23 1215 11/28/23 1500  BP: (!) 95/53 (!) 95/51  Pulse: 75 75  Resp: 17 16  Temp:  36.6 C  SpO2: 98% 98%    Last Pain:  Vitals:   11/28/23 1500  TempSrc: Oral  PainSc:                  Shuntell Foody,E. Catheline Hixon

## 2023-11-28 NOTE — Anesthesia Procedure Notes (Signed)
 Arterial Line Insertion Start/End4/09/2023 7:22 AM, 11/28/2023 7:32 AM Performed by: Darryl Nestle, CRNA, CRNA  Patient location: Pre-op. Preanesthetic checklist: patient identified, IV checked, site marked, risks and benefits discussed, surgical consent, monitors and equipment checked, pre-op evaluation, timeout performed and anesthesia consent Lidocaine 1% used for infiltration Left, radial was placed Catheter size: 20 G Hand hygiene performed  and maximum sterile barriers used   Attempts: 1 Procedure performed without using ultrasound guided technique. Following insertion, dressing applied and Biopatch. Post procedure assessment: normal and unchanged  Patient tolerated the procedure well with no immediate complications.

## 2023-11-28 NOTE — Discharge Summary (Incomplete)
 HEART AND VASCULAR CENTER   MULTIDISCIPLINARY HEART VALVE TEAM  Discharge Summary    Patient ID: Kristina Osborne MRN: [id number]; DOB: [birth year]  Admit date: 11/28/2023 Discharge date: 11/29/2023  PCP:  Corwin Levins, MD  [facility] Cardiologist:  [name], MD  [facility] Structural heart: [name], MD [facility] Electrophysiologist:  None   Discharge Diagnoses    Principal Problem:   S/P mitral valve clip implantation Active Problems:   Thrombocytosis   AVM (arteriovenous malformation) of small bowel, acquired   Gastroesophageal reflux disease without esophagitis   GI bleeding   Severe mitral regurgitation   (HFpEF) heart failure with preserved ejection fraction (HCC)   Allergies Allergies  Allergen Reactions   Aspirin Other (See Comments)    "Due to intestinal issue"   Sulfa Antibiotics Other (See Comments)    bleeding    Diagnostic Studies/Procedures    HEART AND VASCULAR CENTER   MULTIDISCIPLINARY HEART TEAM   Date of Procedure:                11/28/2023   Preoperative Diagnosis:Severe Symptomatic Mitral Regurgitation (Stage D)   Postoperative Diagnosis:    Same    Procedure Performed: Ultrasound-guided right transfemoral venous access Double PreClose right femoral vein Transseptal puncture using Bailess RF needle Mitral valve repair with MitraClip XTW + NTW at A2/P2   Surgeon: Alverda Skeans, MD    Echocardiographer: Eden Emms   Anesthesiologist: Jean Rosenthal   Device Implant: Mitraclip XTW and NTW   Procedural Indication: Severe Non-rheumatic Mitral Regurgitation (Stage D)  _____________    Echo 11/29/23: completed but pending formal read at the time of discharge   History of Present Illness     Kristina Osborne is a 73 y.o. 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   She has a history of thrombocytosis, diagnosed after an episode of feeling ill with tachycardia and nausea, leading to an ER visit where she was found to have a platelet count of 1.5 million and severe anemia. She was diagnosed with JAK2 mutation and started on hydroxyurea. Initially treated with aspirin due to stroke risk concerns, she developed gastrointestinal bleeding, leading to iron infusions over a year and a half. Her last GI bleed was last year, and she has been advised against taking anticoagulants. She continues to receive iron infusions as needed due to anemia from hydroxyurea use.   Echocardiogram 10/11/23 showed preserved EF 60-65% with marked abnormality of the mitral valve with moderate calcification and deformity of the posterior leaflet with moderate prolapse and very severely directed MR with filling of the entire LA. TEE 10/24/23 confirmed severe flail of the posterior leaflet and degenerative MV with severe MR. She was admitted 3/4-11/02/23 with acute on chronic diastolic heart failure exacerbation. [facility] 11/01/23 showed normal coronary arteries and giant V waves to around 48 mmHg with a mean wedge pressure of 27 mmHg. Following discharge she met with Dr. Leafy Ro and due to severe mitral annular calcification, he did not think that she would be best served by a surgical approach and referred her to Dr. Lynnette Caffey for consideration of mTEER. There were plans for a repeat TEE, but this was later cancelled as the necessary information was extracted from the previous study (mean mitral valve gradient of  2.5 mmHg at 85 bpm and clear P2 flail) and she was scheduled for mTEER on 11/28/23.  Hospital Course     Consultants: none   Severe mitral regurgitation:  -- S/p percutaneous mitral valve repair with MitraClip XTW + NTW at A2/P2 on 11/28/23 by Dr. Lynnette Caffey.  -- Plan Plavix 75mg  daily monotherapy given issues  with GI bleeding and anemia in the past.  -- Groin site stable after treatment with lido/epi/silver nitrate.  -- Post op echo completed but pending formal read. -- Seen by cardiac rehab team.  -- Plan discharge home today with close follow up in the outpatient setting.   Acute on chronic HFpEF: -- Pre op BNP 474. -- Continue Lasix 40mg  daily.  -- Will re-evaluate volume status at next visit likely change Lasix to PRN.   Anemia: -- Hg 11.2 today.  -- Plans for monotherapy with Plavix after mTEER given GI bleeding on aspirin.   Thrombocytosis: -- PLAT 657 today.  -- Continue hydroxyurea 500 mg twice daily.  -- Followed by Dr. Leonides Schanz.  _____________  Discharge Vitals Blood pressure (!) 109/41, pulse 68, temperature 98.1 F (36.7 C), temperature source Oral, resp. rate 18, height 5\' 6"  (1.676 m), weight 63 kg, SpO2 99%.  Filed Weights   11/28/23 0644  Weight: 63 kg     GEN: Well nourished, well developed in no acute distress NECK: No JVD CARDIAC: RRR, 1/6 holosystolic murmur at apex. No rubs, gallops RESPIRATORY:  Clear to auscultation without rales, wheezing or rhonchi  ABDOMEN: Soft, non-tender, non-distended EXTREMITIES:  No edema; No deformity.  Groin sites clear without hematoma or ecchymosis.    Disposition   Pt is being discharged home today in good condition.  Follow-up Plans & Appointments     Follow-up Information     Janetta Hora, PA-C. Go on 12/03/2023.   Specialties: Cardiology, Radiology Why: @ 9:30am, please arrive 15 minutes early. Contact information: [address] [place] [address] [phone]                Discharge Instructions     Amb Referral to Cardiac Rehabilitation   Complete by: As directed    Diagnosis: Valve Repair   Valve: Mitral   After initial evaluation and assessments completed: Virtual Based Care may be provided alone or in conjunction with Phase 2 Cardiac Rehab based on patient barriers.:  Yes   Intensive Cardiac Rehabilitation (ICR) [facility] location only OR Traditional Cardiac Rehabilitation (TCR) *If criteria for ICR are not met will enroll in TCR [facility] only): Yes       Discharge Medications   Allergies as of 11/29/2023       Reactions   Aspirin Other (See Comments)   "Due to intestinal issue"   Sulfa Antibiotics Other (See Comments)   bleeding        Medication List     STOP taking these medications    metoprolol tartrate 25 MG tablet Commonly known as: LOPRESSOR       TAKE these medications    acetaminophen 500 MG tablet Commonly known as: TYLENOL Take 1,000 mg by mouth every 6 (six) hours as needed for headache (pain.).   cholestyramine light 4 g packet Commonly known as: PREVALITE Take 2 g by mouth 2 (two) times daily.   clopidogrel 75 MG tablet Commonly known as: PLAVIX Take 1 tablet (75 mg total) by mouth daily with breakfast. Start taking on: November 30, 2023   COSAMIN DS PO Take 1 tablet  by mouth daily.   famotidine 40 MG tablet Commonly known as: PEPCID TAKE 1 TABLET(40 MG) BY MOUTH DAILY   folic acid 1 MG tablet Commonly known as: FOLVITE TAKE 1 TABLET(1 MG) BY MOUTH DAILY   furosemide 40 MG tablet Commonly known as: LASIX Take 1 tablet (40 mg total) by mouth daily.   hydroxyurea 500 MG capsule Commonly known as: HYDREA TAKE 1 CAPSULE(500 MG) BY MOUTH TWICE DAILY   Lycopene 10 MG Caps Take 10 mg by mouth daily.   MAGNESIUM PO Take 1 tablet by mouth daily. Triple   multivitamin with minerals Tabs tablet Take 1 tablet by mouth in the morning.   PRESERVISION AREDS 2+MULTI VIT PO Take 1 capsule by mouth 2 (two) times daily.   sucralfate 1 g tablet Commonly known as: CARAFATE TAKE 1 TABLET(1 GRAM) BY MOUTH FOUR TIMES DAILY AT BEDTIME WITH MEALS What changed: See the new instructions.   vitamin C 1000 MG tablet Take 1,000 mg by mouth in the morning.   VITAMIN D PO Take 5,000 Units by mouth in the morning.           Outstanding Labs/Studies   none ______________________  Duration of Discharge Encounter: APP Time: 15 minutes    Signed, Cline Crock, PA-C 11/29/2023, 9:42 AM [phone]   ATTENDING ATTESTATION:  After conducting a review of all available clinical information with the care team, interviewing the patient, and performing a physical exam, I agree with the findings and plan described in this note.   GEN: No acute distress.   HEENT:  MMM, no JVD, no scleral icterus Cardiac: RRR, no murmurs, rubs, or gallops.  Respiratory: Clear to auscultation bilaterally. GI: Soft, nontender, non-distended  MS: No edema; No deformity. Neuro:  Nonfocal  Vasc:  +2 radial pulses  Patient is doing well after uncomplicated mitral transcatheter edge-to-edge repair with XTW and NTW clips reducing 4+ degenerative mitral regurgitation to 1+.  Her postprocedure course has been unremarkable other than right access site pain which is improving with medications.  She has remained hemodynamically stable with no evidence of stroke.  Her monitor demonstrates no atrial fibrillation.  I did discussion with the patient regarding her medical therapy moving forwards.  Given her history of GI bleeding I think we will pursue Plavix monotherapy for now.  If she has recurrent GI bleeding we may need to modify this approach.  We will continue Lasix for now with a plan to perhaps discontinue this at follow-up.  I spent 20 minutes viewing the patient's echocardiographic images from today's study, discussing the patient's medical therapy, her overall prognosis, and the relative risks of bleeding versus stroke following mitral transcatheter edge-to-edge repair.  Discharge today with close hospital follow-up.  APP discharge time:15 MD discharge time:20  Alverda Skeans, MD Pager [phone]

## 2023-11-28 NOTE — Op Note (Signed)
 HEART AND VASCULAR CENTER   MULTIDISCIPLINARY HEART TEAM  Date of Procedure:  11/28/2023  Preoperative Diagnosis: Severe Symptomatic Mitral Regurgitation (Stage D)  Postoperative Diagnosis: Same   Procedure Performed: Ultrasound-guided right transfemoral venous access Double PreClose right femoral vein Transseptal puncture using Bailess RF needle Mitral valve repair with MitraClip XTW + NTW at A2/P2  Surgeon: Alverda Skeans, MD   Echocardiographer: Eden Emms  Anesthesiologist: Jean Rosenthal  Device Implant: Mitraclip XTW and NTW  Procedural Indication: Severe Non-rheumatic Mitral Regurgitation (Stage D)   Brief History: Patient is a 73 year old female with a history of chronic diastolic heart failure, gastrointestinal bleed secondary to AVMs, thrombocytosis, thymic hyperplasia, and severe degenerative mitral regurgitation who is seen by cardiothoracic surgery and thought to be best served by a nonsurgical approach.  For this reason the patient is referred for elective mitral transcatheter edge-to-edge repair.  Echo Findings: Preop:  Normal LV systolic function Severe degenerative MR secondary to P2 flail and prolapse, Grade 4+ NYHA class2 Post-op:  Unchanged LV systolic function 1+ residual MR  Procedural Details: Prep The patient is brought to the cardiac catheterization lab in the fasting state. General anesthesia is induced. The patient is prepped from the groin to chin. A foley catheter is placed. Hemodynamics are monitored via a radial artery line.   Venous Access Using ultrasound guidance, the right femoral vein is punctured. Ultrasound images are captured and stored in the patient's chart. The vein is dilated and 2 Perclose devices are deplyed at 10' and 2' positions to 'Preclose' the femoral vein. An 8 Fr sheath is inserted.  Transseptal Puncture A Baylis Versacross wire is advanced into the SVC A Baylis transseptal dilator is advanced into the SVC, and the VersaCross RF  wire is retracted into the dilator  The transseptal sheath is retracted into the RA under fluoroscopic and echo guidance to obtain position on the posterior fossa where echo measurements are made to assure appropriate access to the mitral valve. Once proper position is confirmed by echo, RF energy is delivered and the VersaCross wire is advanced into the LA without resistance. The dilator and sheath are advanced over the wire where proper position is confirmed by echo and pressure measurement Weight based IV heparin is administered and a therapeutic ACT > 250 is confirmed  Steerable Guide Catheter Insertion The VersaCross wire is positioned at the left upper pulmonary vein The femoral vein is progressively dilated and the 24 Fr Steerable guide catheter is inserted and then directed across the interatrial septum over the wire. Position is confirmed approximately 3 cm into the left atrium The guide is de-aired   MitraClip Insertion The MitraClip XTW is prepped per protocol and inserted via the introducer into the steerable guide catheter The Clip Delivery System (CDS) is advanced under fluoro and echo guidance so that the sleeve markers are evenly spaced on each side of the guide marker  MitraClip Positioning in the Left Atrium (Supravalvular Alignment) M-knob is applied to bring the Clip towards the mitral valve. Echo guidance is used to avoid contact with LA structures. The Clip arms are opened to 180 degrees 2D and 3D TEE imaging is performed in multiple planes and the Clip is positioned and aligned above the valve using standard steering techniques   Entry into the Left Ventricle and Mitral Valve Leaflet Grasp The Clip is advanced across the mitral valve into the LV, maintaining proper orientation The Clip arms are opened to 120 degrees and the Clip is slowly retracted  Capture of both the  anterior and posterior leaflets are visualized by echo and the grippers are dropped  MitraClip  Deployment After extensive echo evaluation, reduction in mitral regurgitation is felt to be adequate Following standard protocol, the lock line is removed after testing the lock mechanism. The lock is rechecked and is shown to be intact. The MitraClip device is deployed and the clip delivery system is removed under echo guidance with caution taken to avoid contact with LA structures  Placement of Clip #2 MitraClip Insertion The MitraClip NTW is prepped per protocol and inserted via the introducer into the steerable guide catheter The Clip Delivery System (CDS) is advanced under fluoro and echo guidance so that the sleeve markers are evenly spaced on each side of the guide marker  MitraClip Positioning in the Left Atrium (Supravalvular Alignment) M-knob is applied to bring the Clip towards the mitral valve. Echo guidance is used to avoid contact with LA structures. Low tidal volume respiration is initiated The Clip arms are opened to 180 degrees 2D and 3D TEE imaging is performed in multiple planes and the Clip is positioned and aligned above the valve using standard steering techniques   Entry into the Left Ventricle and Mitral Valve Leaflet Grasp The Clip is advanced across the mitral valve into the LV, maintaining proper orientation and with caution taken to avoid contact with the first Clip The Clip arms are opened further and the Clip is slowly retracted  Capture of both the anterior and posterior leaflets are visualized by echo and the grippers are dropped  MitraClip Deployment After extensive echo evaluation, reduction in mitral regurgitation is felt to be adequate Following standard protocol, the MitraClip device is deployed, gripper line and lock line are removed  Device Removal The clip delivery system is removed under echo guidance The steerable guide catheter is retracted into the right atrium and the interatrial septum is assessed by echo without evidence of right-to-left  shunting or large ASD  Hemostasis The guide catheter is removed over a 0.035" wire and the Perclose sutures are tightened with complete hemostasis and no evidence of hematoma  Estimated blood loss: minimal  There are no immediate procedural complications. The patient is transferred to the post-procedure recovery area in stable condition.   Charlies Constable [facility] 11/28/2023 11:04 AM

## 2023-11-28 NOTE — Anesthesia Procedure Notes (Signed)
 Procedure Name: Intubation Date/Time: 11/28/2023 8:30 AM  Performed by: Darryl Nestle, CRNAPre-anesthesia Checklist: Patient identified, Emergency Drugs available, Suction available and Patient being monitored Patient Re-evaluated:Patient Re-evaluated prior to induction Oxygen Delivery Method: Circle system utilized Preoxygenation: Pre-oxygenation with 100% oxygen Induction Type: IV induction Ventilation: Mask ventilation without difficulty Laryngoscope Size: Mac and 3 Grade View: Grade I Tube type: Oral Tube size: 7.0 mm Number of attempts: 1 Airway Equipment and Method: Stylet Placement Confirmation: ETT inserted through vocal cords under direct vision, positive ETCO2 and breath sounds checked- equal and bilateral Secured at: 21 cm Tube secured with: Tape Dental Injury: Teeth and Oropharynx as per pre-operative assessment

## 2023-11-28 NOTE — TOC Initial Note (Signed)
 Transition of Care [facility]) - Initial/Assessment Note    Patient Details  Name: Kristina Osborne MRN: [id number] Date of Birth: 04-Mar-1951  Transition of Care [facility]) CM/SW Contact:    Marliss Coots, LCSW Phone Number: 11/28/2023, 2:28 PM  Clinical Narrative:                  2:28 PM CSW introduced self and role to patient at bedside. CSW offered support/resources and needed. Patient declined CSW offer. Patient confirmed she is from home with spouse where they reside together in a one story home. Patient stated her spouse or family may be able to provide transportation depending on discharge date/time.  Expected Discharge Plan: Home/Self Care Barriers to Discharge: Continued Medical Work up   Patient Goals and CMS Choice Patient states their goals for this hospitalization and ongoing recovery are:: to return home          Expected Discharge Plan and Services       Living arrangements for the past 2 months: Single Family Home                                      Prior Living Arrangements/Services Living arrangements for the past 2 months: Single Family Home Lives with:: Spouse Patient language and need for interpreter reviewed:: Yes Do you feel safe going back to the place where you live?: Yes            Criminal Activity/Legal Involvement Pertinent to Current Situation/Hospitalization: No - Comment as needed  Activities of Daily Living   ADL Screening (condition at time of admission) Independently performs ADLs?: Yes (appropriate for developmental age) Is the patient deaf or have difficulty hearing?: No Does the patient have difficulty seeing, even when wearing glasses/contacts?: No Does the patient have difficulty concentrating, remembering, or making decisions?: No  Permission Sought/Granted Permission sought to share information with : Family Supports Permission granted to share information with : No (Contact information on chart)  Share Information  with NAME: Kathleen Argue     Permission granted to share info w Relationship: Sister  Permission granted to share info w Contact Information: [phone]  Emotional Assessment Appearance:: Appears stated age Attitude/Demeanor/Rapport: Engaged Affect (typically observed): Accepting, Adaptable, Pleasant, Stable, Calm, Appropriate Orientation: : Oriented to Self, Oriented to Place, Oriented to  Time, Oriented to Situation Alcohol / Substance Use: Not Applicable Psych Involvement: No (comment)  Admission diagnosis:  S/P mitral valve clip implantation [U13.244, Z95.818] Patient Active Problem List   Diagnosis Date Noted   S/P mitral valve clip implantation 11/28/2023   Pulmonary hypertension, unspecified (HCC) 11/02/2023   (HFpEF) heart failure with preserved ejection fraction (HCC) 11/02/2023   Hypokalemia 10/18/2023   Severe mitral regurgitation 10/18/2023   Posterior neck pain 09/08/2023   LLQ pain 09/08/2023   Acute non-recurrent maxillary sinusitis 08/09/2023   Sinus congestion 08/09/2023   Sinus headache 08/09/2023   Macrocytic anemia 06/25/2022   GI bleed 06/24/2022   Iron deficiency anemia    Heme positive stool    Jejunal polyp    Benign neoplasm of colon    Symptomatic anemia 04/16/2022   GI bleeding 04/15/2022   Right elbow pain 11/06/2021   Palpitations 11/06/2021   Cervical radiculitis 11/02/2021   AVM (arteriovenous malformation) of small bowel, 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   MVP (mitral valve prolapse) 09/10/2021   Gastroesophageal reflux disease without esophagitis 09/10/2021   Visit for screening mammogram 09/07/2021  Thrombocytosis 11/12/2019   PCP:  Corwin Levins, MD Pharmacy:   [facility] [phone] [place], [place] - [address] AT [facility] [address] [facility] [address] [facility] [address] [place] [address] Phone: [phone] Fax: [phone]     Social Drivers of Health (SDOH) Social History: SDOH Screenings   Food Insecurity: No Food  Insecurity (11/05/2023)  Housing: Low Risk  (11/05/2023)  Transportation Needs: No Transportation Needs (11/05/2023)  Utilities: Not At Risk (11/05/2023)  Depression (PHQ2-9): Low Risk  (10/18/2023)  Social Connections: Socially Integrated (11/01/2023)  Tobacco Use: Medium Risk (11/28/2023)   SDOH Interventions:     Readmission Risk Interventions     No data to display

## 2023-11-28 NOTE — Progress Notes (Signed)
 Went to 2C15 to assess the patient's right femoral vein site that was bleeding. 2C RN holding pressure. Groin site noted to have slight ooze, manual pressure held for approximately 15 minutes by [facility] RN, site still had very slight ooze. Tegaderm dressing placed tightly with guaze. Patient also reported that she had a pain in between her shoulder blades prior to pressure being held on her groin that she described as a burning sensation that lasted for approximately a minute then resolved. Notified Dr. Lynnette Caffey regarding groin site as well as patient reporting the episode of pain.

## 2023-11-28 NOTE — Interval H&P Note (Signed)
 History and Physical Interval Note:  11/28/2023 7:36 AM  Kristina Osborne  has presented today for surgery, with the diagnosis of mitral insufficiency.  The various methods of treatment have been discussed with the patient and family. After consideration of risks, benefits and other options for treatment, the patient has consented to  Procedure(s): TRANSCATHETER MITRAL EDGE TO EDGE REPAIR (N/A) TRANSESOPHAGEAL ECHOCARDIOGRAM (N/A) as a surgical intervention.  The patient's history has been reviewed, patient examined, no change in status, stable for surgery.  I have reviewed the patient's chart and labs.  Questions were answered to the patient's satisfaction.     [name]

## 2023-11-28 NOTE — Op Note (Signed)
 PROCEDURE:  Transcatheter edge to edge mitral valve repair (TEER) INDICATION: Severe symptomatic mitral regurgitation (Stage D)  SURGEON:  Alverda Skeans, MD  CO-SURGEON: Tonny Bollman, MD   PROCEDURAL DETAILS: General anesthesia is induced.  The patient is prepped and draped.  Baseline transesophageal echo images are obtained and confirm appropriate anatomy for transcatheter edge-to-edge mitral valve repair.  Using vascular ultrasound guidance, the right common femoral vein is accessed via a front wall puncture.  2 Perclose sutures are deployed and an 8 [place] sheath is inserted.  A versa cross wire is advanced into the SVC.  Heparin is administered and a therapeutic ACT is achieved.  Transseptal puncture is performed over the mid posterior portion of the fossa.  The septum is dilated while the MitraClip steerable guide catheter is prepped.  After progressively dilating the right common femoral vein, the 24 French MitraClip steerable guide catheter is inserted and advanced across the interatrial septum.  The dilator and the versa cross wire were carefully removed and the guide tip is appropriately positioned approximately 3 cm across the interatrial septum.  A MitraClip XTW device is prepped per protocol.  The device is inserted through the steerable guide catheter with caution taken to avoid air entrapment.  The MitraClip device is positioned above the mitral valve after applying appropriate curve to the guide catheter.  The MitraClip device is then oriented coaxially with the anterior and posterior leaflets of the mitral valve.  Low tidal volume ventilation is initiated.  The clip device is advanced across the mitral valve and pulled back until capture of both the anterior and posterior leaflets is achieved.  Grippers are dropped and the clip is closed under TEE guidance.  Careful TEE assessment is performed and reduction in mitral valve regurgitation is felt to be appropriate.  Leaflet insertion is  verified using 3D imaging.  The MitraClip device is deployed using normal technique and the clip delivery system is removed.  After full TEE assessment, the procedural result is felt to be adequate with reduction of mitral regurgitation to 2+.   After the first clip is released, the valve is interrogated carefully with a TEE.  There was an acceptable result with 1-2+ residual MR, but there was persistent prolapse and we felt the result would be more durable with a second (stabilizing) clip.  An NTW device is prepped per protocol, reinserted through the steerable guide catheter, and positioned just medial to the first clip.  There was not enough height to check clip arm orientation above the valve so the valve is crossed, positioned just beneath the leaflets, placed is close to the first clip is possible, and then pulled back to obtain good leaflet insertion about the anterior and posterior leaflets.  The clip is closed and leaflet insertion checked with an excellent result.  The lock is checked per protocol, then the clip released.  At the completion of the procedure, the mean transmitral gradient is 6 mmHg and there is only mild residual MR with both clips in parallel with 1 another in appropriate position.  PROCEDURE COMPLETION: The steerable guide catheter is pulled back into the right atrium and the interatrial septum is assessed with TEE.  There is no significant septal injury seen and no right to left shunting.  The guide catheter is removed and the Perclose sutures are tightened.    CONCLUSION: Successful transcatheter edge-to-edge mitral valve repair under fluoroscopic and echo guidance, reducing baseline 4+ mitral regurgitation to 1+, 2 clips positioned A2/P2 (second  clip medial to first clip).  Tonny Bollman 11/28/2023 10:29 AM

## 2023-11-28 NOTE — Transfer of Care (Addendum)
 Immediate Anesthesia Transfer of Care Note  Patient: Kristina Osborne  Procedure(s) Performed: TRANSCATHETER MITRAL EDGE TO EDGE REPAIR TRANSESOPHAGEAL ECHOCARDIOGRAM  Patient Location: Cath Lab  Anesthesia Type:General  Level of Consciousness: awake  Airway & Oxygen Therapy: Patient Spontanous Breathing  Post-op Assessment: Report given to RN and Post -op Vital signs reviewed and stable  Post vital signs: Reviewed and stable  Last Vitals:  Vitals Value Taken Time  BP 103/55 11/28/23 1145  Temp 36.4 C 11/28/23 1107  Pulse 60 11/28/23 1148  Resp 15 11/28/23 1156  SpO2 97 % 11/28/23 1148  Vitals shown include unfiled device data.  Last Pain:  Vitals:   11/28/23 1155  TempSrc:   PainSc: 7       Patients Stated Pain Goal: 0 (11/28/23 0654)  Complications: There were no known notable events for this encounter.

## 2023-11-28 NOTE — Progress Notes (Signed)
  HEART AND VASCULAR CENTER   MULTIDISCIPLINARY HEART VALVE TEAM  Patient doing well s/p mTEER. She is hemodynamically stable. Right groin sight with persistent oozing. This responded nicely to of lido/epi and silver nitrate. Transferred from cath lab holding to [facility]. Has some transient burning back pain that sounded nerve related. Early ambulation after bedrest completed and hopeful discharge over the next 24-48 hours.   Cline Crock PA-C  [facility]  Pager [phone]

## 2023-11-29 ENCOUNTER — Telehealth: Payer: Self-pay | Admitting: Internal Medicine

## 2023-11-29 ENCOUNTER — Other Ambulatory Visit: Payer: Self-pay | Admitting: Physician Assistant

## 2023-11-29 ENCOUNTER — Inpatient Hospital Stay (HOSPITAL_COMMUNITY)

## 2023-11-29 ENCOUNTER — Encounter (HOSPITAL_COMMUNITY): Payer: Self-pay | Admitting: Internal Medicine

## 2023-11-29 DIAGNOSIS — Z95818 Presence of other cardiac implants and grafts: Secondary | ICD-10-CM

## 2023-11-29 DIAGNOSIS — Z9889 Other specified postprocedural states: Secondary | ICD-10-CM

## 2023-11-29 DIAGNOSIS — Z954 Presence of other heart-valve replacement: Secondary | ICD-10-CM

## 2023-11-29 DIAGNOSIS — I340 Nonrheumatic mitral (valve) insufficiency: Secondary | ICD-10-CM

## 2023-11-29 LAB — ECHOCARDIOGRAM COMPLETE
AR max vel: 2.34 cm2
AV Area VTI: 2.4 cm2
AV Area mean vel: 2.46 cm2
AV Mean grad: 3 mmHg
AV Peak grad: 5 mmHg
Ao pk vel: 1.12 m/s
Area-P 1/2: 2.29 cm2
Height: 66 in
MV VTI: 1.25 cm2
S' Lateral: 3.9 cm
Weight: 2224 [oz_av]

## 2023-11-29 LAB — BASIC METABOLIC PANEL WITH GFR
Anion gap: 9 (ref 5–15)
BUN: 13 mg/dL (ref 8–23)
CO2: 23 mmol/L (ref 22–32)
Calcium: 9.4 mg/dL (ref 8.9–10.3)
Chloride: 106 mmol/L (ref 98–111)
Creatinine, Ser: 0.84 mg/dL (ref 0.44–1.00)
GFR, Estimated: >60 mL/min (ref >60)
Glucose, Bld: 119 mg/dL — ABNORMAL HIGH (ref 70–99)
Potassium: 3.6 mmol/L (ref 3.5–5.1)
Sodium: 138 mmol/L (ref 135–145)

## 2023-11-29 LAB — CBC
HCT: 33 % — ABNORMAL LOW (ref 36.0–46.0)
Hemoglobin: 11.2 g/dL — ABNORMAL LOW (ref 12.0–15.0)
MCH: 42.6 pg — ABNORMAL HIGH (ref 26.0–34.0)
MCHC: 33.9 g/dL (ref 30.0–36.0)
MCV: 125.5 fL — ABNORMAL HIGH (ref 80.0–100.0)
Platelets: 657 10*3/uL — ABNORMAL HIGH (ref 150–400)
RBC: 2.63 MIL/uL — ABNORMAL LOW (ref 3.87–5.11)
RDW: 15.3 % (ref 11.5–15.5)
WBC: 5.7 10*3/uL (ref 4.0–10.5)
nRBC: 0 % (ref 0.0–0.2)

## 2023-11-29 MED ORDER — CLOPIDOGREL BISULFATE 75 MG PO TABS: 75.0000 mg | ORAL_TABLET | Freq: Every day | ORAL | 1 refills | Status: DC

## 2023-11-29 NOTE — Progress Notes (Signed)
 Pt seen in bed, pt was educated on restrictions, ex guidelines, HH diet, and CRP2. Pt will be referred to [facility] for rehab. She is looking forward to getting stronger.   [id number]  Faustino Congress MS, ACSM--CEP  11/29/2023 8:46 AM

## 2023-11-29 NOTE — Progress Notes (Signed)
 DISCHARGE NOTE HOME Kristina Osborne to be discharged Home per MD order. Discussed prescriptions and follow up appointments with the patient. Prescriptions given to patient; medication list explained in detail. Patient verbalized understanding.  Skin clean, dry and intact without evidence of skin break down, no evidence of skin tears noted. IV catheter discontinued intact. Site without signs and symptoms of complications. Dressing and pressure applied. Pt denies pain at the site currently. No complaints noted.  Patient free of lines, drains, and wounds.   An After Visit Summary (AVS) was printed and given to the patient. Patient escorted via wheelchair, and discharged home via private auto.  [name], RN

## 2023-11-29 NOTE — Progress Notes (Signed)
 AVS gone over with patient, answered any/all questions, IV's removed, patient belongings gathered, and patient was wheeled out to family vehicle.

## 2023-11-29 NOTE — Telephone Encounter (Signed)
 Called patient back about message. Patient stated she needs to ask Carlean Jews PA a questions. The question-  Patient's sister is sick with a fever. She has her dog. Should she wait to pick up her dog or is it okay to go pick her dog up form her sister's house?

## 2023-11-29 NOTE — Telephone Encounter (Signed)
 Janetta Hora, PA-C to Me    11/29/23 10:11 AM I think it's okay to get the dog, but try to minimize exposure. Would recommend she wear a mask.   Called patient aback with recommendations.

## 2023-11-29 NOTE — Telephone Encounter (Signed)
 Patient wants a call back from RN Baxter Hire regarding their discussion this morning.

## 2023-11-29 NOTE — TOC Transition Note (Signed)
 Transition of Care [facility]) - Discharge Note   Patient Details  Name: Kristina Osborne MRN: [id number] Date of Birth: 13-Jun-1951  [facility] of [facility]) CM/SW Contact:  Harriet Masson, RN Phone Number: 11/29/2023, 12:38 PM   Clinical Narrative:    Patient stable to discharge home.  No TOC needs at this time.    Final next level of care: Home/Self Care Barriers to Discharge: Barriers Resolved   Patient Goals and CMS Choice Patient states their goals for this hospitalization and ongoing recovery are:: return home          Discharge Placement            Home           Discharge Plan and Services Additional resources added to the After Visit Summary for                                       Social Drivers of Health (SDOH) Interventions SDOH Screenings   Food Insecurity: No Food Insecurity (11/28/2023)  Housing: Low Risk  (11/28/2023)  Transportation Needs: No Transportation Needs (11/28/2023)  Utilities: Not At Risk (11/28/2023)  Depression (PHQ2-9): Low Risk  (10/18/2023)  Social Connections: Socially Integrated (11/28/2023)  Tobacco Use: Medium Risk (11/28/2023)     Readmission Risk Interventions     No data to display

## 2023-11-29 NOTE — Discharge Instructions (Signed)
 Home Care Following Your Mitral Valve Clip Procedure      If you have any questions or concerns you can call the structural heart office at [phone] during normal business hours 8am-4pm. If you have an urgent need after hours or on the weekend, please call [phone] to talk to the on call provider for general cardiology. If you have an emergency that requires immediate attention, please call 911.   Groin Site Care Refer to this sheet in the next few weeks. These instructions provide you with information on caring for yourself after your procedure. Your caregiver may also give you more specific instructions. Your treatment has been planned according to current medical practices, but problems sometimes occur. Call your caregiver if you have any problems or questions after your procedure. HOME CARE INSTRUCTIONS You may shower 24 hours after the procedure. Remove the bandage (dressing) and gently wash the site with plain soap and water. Gently pat the site dry.  Do not apply powder or lotion to the site.  Do not sit in a bathtub, swimming pool, or whirlpool for 5 to 7 days.  No bending, squatting, or lifting anything over 10 pounds (4.5 kg) as directed by your caregiver.  Inspect the site at least twice daily.  Do not drive home if you are discharged the same day of the procedure. Have someone else drive you.  You may drive 72 hours after the procedure unless otherwise instructed by your caregiver.  What to expect: Any bruising will usually fade within 1 to 2 weeks.  Blood that collects in the tissue (hematoma) may be painful to the touch. It should usually decrease in size and tenderness within 1 to 2 weeks.  SEEK IMMEDIATE MEDICAL CARE IF: You have unusual pain at the groin site or down the affected leg.  You have redness, warmth, swelling, or pain at the groin site.  You have drainage (other than a small amount of blood on the dressing).  You have chills.  You have a fever or persistent  symptoms for more than 72 hours.  You have a fever and your symptoms suddenly get worse.  Your leg becomes pale, cool, tingly, or numb.  You have bleeding from the site. Hold pressure on the site until it subsides.    After MitraClip Checklist  Check  Test Description   Follow up appointment in 1-2 weeks  Most of our patients will see our structural heart APP or your primary cardiologist within 1-2 weeks. Your incision site will be checked and you will be cleared to resume all normal activities if you are doing well.     1 month echo and follow up  You will have an echo to check on your heart valve clip and be seen back in the office by our structural heart APP   Follow up with your primary cardiologist You will need to be seen by your primary cardiologist in the following 3-6 months after your 1 month appointment in the valve clinic. Often times your blood thinners will be changed. This is decided on a case by case basis.    1 year echo and follow up You will have another echo to check on your heart valve after one year and be seen back in the office by our structural heart APP. This your last structural heart visit.   Bacterial endocarditis prophylaxis  You will have to take antibiotics for the rest of your life before all dental procedures (even dental cleanings) to protect your  heart valve from potential infection. Antibiotics are also required before some surgeries. Please check with your cardiologist before scheduling any surgeries. Also, please make sure to tell us if you have a penicillin allergy as you will require an alternative antibiotic.    ______________  Your Implant Identification Card Following your procedure, you will receive an Implant Identification Card, which your doctor will fill out and which you must carry with you at all times. Show your Implant Identification Card if you report to an emergency room. This card identifies you as a patient who has had a device implanted. If  you require a magnetic resonance imaging (MRI) scan, tell your doctor or MRI technician that you have a clip device implanted. Test results indicate that patients with the mitral valve clip can safely undergo MRI scans under certain conditions described on the card.

## 2023-11-30 ENCOUNTER — Telehealth: Payer: Self-pay

## 2023-11-30 ENCOUNTER — Telehealth: Payer: Self-pay | Admitting: Physician Assistant

## 2023-11-30 ENCOUNTER — Encounter (HOSPITAL_BASED_OUTPATIENT_CLINIC_OR_DEPARTMENT_OTHER): Payer: Self-pay

## 2023-11-30 NOTE — Telephone Encounter (Signed)
  HEART AND VASCULAR CENTER   MULTIDISCIPLINARY HEART VALVE TEAM   Patient contacted regarding discharge from [facility] on 11/29/23  Patient understands to follow up with a structural heart APP on 12/03/23 at [address] [facility]  Patient understands discharge instructions? yes Patient understands medications and regimen? yes Patient understands to bring all medications to this visit? yes  Cline Crock PA-C  [facility]

## 2023-11-30 NOTE — Progress Notes (Signed)
 HEART AND VASCULAR CENTER   MULTIDISCIPLINARY HEART VALVE CLINIC                                     Cardiology Office Note:    Date:  12/03/2023   ID:  Kristina Osborne, DOB April 14, 1951, MRN [id number]  PCP:  Corwin Levins, MD  [facility] HeartCare Cardiologist:  [name], MD  [facility] Structural heart: [name], MD [facility] Electrophysiologist:  None   Referring MD: Corwin Levins, MD   TOC s/p mTEER  History of Present Illness:    Kristina Osborne is a 73 y.o. 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   She has a history of thrombocytosis, diagnosed after an episode of feeling ill with tachycardia and nausea, leading to an ER visit where she was found to have a platelet count of 1.5 million and severe anemia. She was diagnosed with JAK2 mutation and started on hydroxyurea. Initially treated with aspirin due to stroke risk concerns, she developed gastrointestinal bleeding, leading to iron infusions over a year and a half. Her last GI bleed was last year, and she has been advised against taking anticoagulants. She continues to receive iron infusions as needed due to anemia from hydroxyurea use.   Echocardiogram 10/11/23 showed preserved EF 60-65% with marked abnormality of the mitral valve with moderate calcification and deformity of the posterior leaflet with moderate prolapse and very severely directed MR with filling of the entire LA. TEE 10/24/23 confirmed severe flail of the posterior leaflet and degenerative MV with severe MR. She was admitted 3/4-11/02/23 with acute on chronic diastolic heart failure exacerbation. [facility] 11/01/23 showed normal coronary arteries and giant V waves to around 48 mmHg with a mean wedge pressure of  27 mmHg. Following discharge she met with Dr. Leafy Ro and due to severe mitral annular calcification, he did not think that she would be best served by a surgical approach and referred her to Dr. Lynnette Caffey for consideration of mTEER. There were plans for a repeat TEE, but this was later cancelled as the necessary information was extracted from the previous study (mean mitral valve gradient of 2.5 mmHg at 85 bpm and clear P2 flail). S/p percutaneous mitral valve repair with MitraClip XTW + NTW at A2/P2 on 11/28/23 by Dr. Lynnette Caffey. Post op echo showed EF 65-70%, small iASD, and 2 mitra-clips noted in the A2/P2 position with mild residual MR with a mean gradient of 6 mm hg. She was discharged on Plavix monotherapy given history of GI bleeding.  Today the patient presents to clinic for follow up. Here with her husband. Having unremitting diarrhea and fatigue. No CP or SOB. No LE edema, orthopnea or PND. No dizziness or syncope. No blood in stool or urine. No palpitations. Has had very good oral intake with a bland diet and electrolyte water. She is worried about her hemorrhoids with being on Plavix.      Past Medical History:  Diagnosis Date   ABLA (acute blood loss anemia) 04/16/2022   Congenital malformation of intestinal fixation    Gastritis    Gastroesophageal reflux disease without esophagitis 09/10/2021   IDA (iron deficiency anemia)  Mitral valve prolapse    Persistent hyperplasia of thymus (HCC)    S/P Thymectomy   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 Lynnette Caffey   Thrombocytosis      Current Medications: Current Meds  Medication Sig   acetaminophen (TYLENOL) 500 MG tablet Take 1,000 mg by mouth every 6 (six) hours as needed for headache (pain.).   amoxicillin (AMOXIL) 500 MG tablet Take 4 tablets (2,000 mg total) by mouth as directed. 1 hour prior to dental work including cleanings   cholestyramine  light (PREVALITE) 4 g packet Take 2 g by mouth 2 (two) times daily.   clopidogrel (PLAVIX) 75 MG tablet Take 1 tablet (75 mg total) by mouth daily with breakfast.   famotidine (PEPCID) 40 MG tablet TAKE 1 TABLET(40 MG) BY MOUTH DAILY   folic acid (FOLVITE) 1 MG tablet TAKE 1 TABLET(1 MG) BY MOUTH DAILY   furosemide (LASIX) 40 MG tablet Take 1 tablet (40 mg total) by mouth as needed for fluid or edema.   Glucosamine-Chondroitin (COSAMIN DS PO) Take 1 tablet by mouth daily.   hydroxyurea (HYDREA) 500 MG capsule TAKE 1 CAPSULE(500 MG) BY MOUTH TWICE DAILY   Lycopene 10 MG CAPS Take 10 mg by mouth daily.   sucralfate (CARAFATE) 1 g tablet TAKE 1 TABLET(1 GRAM) BY MOUTH FOUR TIMES DAILY AT BEDTIME WITH MEALS (Patient taking differently: Take 1 g by mouth at bedtime.)   [DISCONTINUED] Ascorbic Acid (VITAMIN C) 1000 MG tablet Take 1,000 mg by mouth in the morning.   [DISCONTINUED] furosemide (LASIX) 40 MG tablet Take 1 tablet (40 mg total) by mouth daily.   [DISCONTINUED] MAGNESIUM PO Take 1 tablet by mouth daily. Triple   [DISCONTINUED] Multiple Vitamin (MULTIVITAMIN WITH MINERALS) TABS tablet Take 1 tablet by mouth in the morning.   [DISCONTINUED] Multiple Vitamins-Minerals (PRESERVISION AREDS 2+MULTI VIT PO) Take 1 capsule by mouth 2 (two) times daily.   [DISCONTINUED] VITAMIN D PO Take 5,000 Units by mouth in the morning.      ROS:   Please see the history of present illness.    All other systems reviewed and are negative.  EKGs       Risk Assessment/Calculations:           Physical Exam:    VS:  BP 100/60 (BP Location: Right Arm)   Pulse 84   Ht 5\' 6"  (1.676 m)   Wt 143 lb (64.9 kg)   LMP  (LMP Unknown)   SpO2 97%   BMI 23.08 kg/m     Wt Readings from Last 3 Encounters:  12/03/23 143 lb (64.9 kg)  11/28/23 139 lb (63 kg)  11/26/23 140 lb (63.5 kg)     GEN: Well nourished, well developed in no acute distress NECK: No JVD CARDIAC: RRR, 1/6 holosystolic murmur @ apex.  No rubs, gallops RESPIRATORY:  Clear to auscultation without rales, wheezing or rhonchi  ABDOMEN: Soft, non-tender, non-distended EXTREMITIES:  No edema; No deformity.  Groin site clear without hematoma or ecchymosis. Some mild delayed healing at incision site.  ASSESSMENT:    1. S/P mitral valve clip implantation   2. Chronic heart failure with preserved ejection fraction (HCC)   3. Anemia, unspecified type   4. Thrombocytosis   5. Diarrhea, unspecified type     PLAN:    In order of problems listed above:  Severe mitral regurgitation s/p mTEER:  -- Pt doing okay  s/p clip.   -- Groin site healing well.  -- SBE prophylaxis discussed; I have RX'd amoxicillin.  -- Continue Plavix monotherapy given issues with GI bleeding and anemia in the past.  -- Cleared to resume all activities without restriction. -- I will see back for 1 month echo and OV.  Chronic HFpEF: -- Appears euvolemic.  -- Has been holding Lasix in the setting of ongoing diarrhea.  -- Change Lasix to PRN.   Anemia: -- Followed closely by Dr. Leonides Schanz  Thrombocytosis: -- Continue hydroxyurea 500 mg twice daily.  -- Followed by Dr. Leonides Schanz.  Diarrhea:  -- Pt concerned this is related to Plavix.  -- Okay to use imodium as needed. -- Likely viral in etiology. Will continue to monitor.     Cardiac Rehabilitation Eligibility Assessment  The patient is ready to start cardiac rehabilitation from a cardiac standpoint.     Medication Adjustments/Labs and Tests Ordered: Current medicines are reviewed at length with the patient today.  Concerns regarding medicines are outlined above.  No orders of the defined types were placed in this encounter.  Meds ordered this encounter  Medications   amoxicillin (AMOXIL) 500 MG tablet    Sig: Take 4 tablets (2,000 mg total) by mouth as directed. 1 hour prior to dental work including cleanings    Dispense:  12 tablet    Refill:  12    Supervising Provider:   COOPER, MICHAEL  [[id number]]   furosemide (LASIX) 40 MG tablet    Sig: Take 1 tablet (40 mg total) by mouth as needed for fluid or edema.    Dispense:  90 tablet    Refill:  3    Patient Instructions  Medication Instructions:  Your physician has recommended you make the following change in your medication:  Start Amoxicillin 500 mg, take 4 tablets by mouth 1 hour prior to dental procedures and cleanings.  2. DECREASE LASIX TO AS NEEDED.   *If you need a refill on your cardiac medications before your next appointment, please call your pharmacy*  Lab Work: NONE If you have labs (blood work) drawn today and your tests are completely normal, you will receive your results only by: MyChart Message (if you have MyChart) OR A paper copy in the mail If you have any lab test that is abnormal or we need to change your treatment, we will call you to review the results.  Testing/Procedures: NONE  Follow-Up: At [facility], you and your health needs are our priority.  As part of our continuing mission to provide you with exceptional heart care, our providers are all part of one team.  This team includes your primary Cardiologist (physician) and Advanced Practice Providers or APPs (Physician Assistants and Nurse Practitioners) who all work together to provide you with the care you need, when you need it.  Your next appointment:   KEEP SCHEDULED FOLLOW-UP  We recommend signing up for the patient portal called "MyChart".  Sign up information is provided on this After Visit Summary.  MyChart is used to connect with patients for Virtual Visits (Telemedicine).  Patients are able to view lab/test results, encounter notes, upcoming appointments, etc.  Non-urgent messages can be sent to your provider as well.   To learn more about what you can do with MyChart, go to [email or web address].   Other Instructions       1st Floor: - Lobby - Registration  - Pharmacy  - Lab - Cafe  2nd Floor: - PV Lab -  Diagnostic Testing (echo, CT, nuclear med)  3rd Floor: - Vacant  4th Floor: - TCTS (cardiothoracic surgery) - AFib Clinic - Structural Heart Clinic - Vascular Surgery  - Vascular Ultrasound  5th Floor: - HeartCare Cardiology (general and EP) - Clinical Pharmacy for coumadin, hypertension, lipid, weight-loss medications, and med management appointments    Valet parking services will be available as well.      Signed, Cline Crock, PA-C  12/03/2023 10:15 AM    [facility]

## 2023-11-30 NOTE — Telephone Encounter (Signed)
 The patient called into the office with concerns about drainage from her groin site after mTEER.  The patient describes dark red drainage that she just noticed and wanted to make sure the site was okay heading into the weekend.  The patient denies pain or swelling at site but does have soreness in her thigh. The patient required xylocaine injection and application of silver nitrate at site following her procedure due to oozing. I advised the pt that the drainage is from the area where the silver nitrate was applied and that this should resolve. The patient will continue to monitor her groin site and call with any additional questions or concerns.

## 2023-12-03 ENCOUNTER — Ambulatory Visit: Attending: Physician Assistant | Admitting: Physician Assistant

## 2023-12-03 ENCOUNTER — Ambulatory Visit: Admitting: Internal Medicine

## 2023-12-03 VITALS — BP 100/60 | HR 84 | Ht 66.0 in | Wt 143.0 lb

## 2023-12-03 DIAGNOSIS — Z9889 Other specified postprocedural states: Secondary | ICD-10-CM

## 2023-12-03 DIAGNOSIS — Z95818 Presence of other cardiac implants and grafts: Secondary | ICD-10-CM

## 2023-12-03 DIAGNOSIS — R197 Diarrhea, unspecified: Secondary | ICD-10-CM

## 2023-12-03 DIAGNOSIS — I5032 Chronic diastolic (congestive) heart failure: Secondary | ICD-10-CM | POA: Diagnosis not present

## 2023-12-03 DIAGNOSIS — D75839 Thrombocytosis, unspecified: Secondary | ICD-10-CM | POA: Diagnosis not present

## 2023-12-03 DIAGNOSIS — D649 Anemia, unspecified: Secondary | ICD-10-CM | POA: Diagnosis not present

## 2023-12-03 MED ORDER — AMOXICILLIN 500 MG PO TABS: 2000.0000 mg | ORAL_TABLET | ORAL | 12 refills | Status: DC

## 2023-12-03 MED ORDER — FUROSEMIDE 40 MG PO TABS: 40.0000 mg | ORAL_TABLET | ORAL | 3 refills | Status: DC | PRN

## 2023-12-03 NOTE — Patient Instructions (Signed)
 Medication Instructions:  Your physician has recommended you make the following change in your medication:  Start Amoxicillin 500 mg, take 4 tablets by mouth 1 hour prior to dental procedures and cleanings.  2. DECREASE LASIX TO AS NEEDED.   *If you need a refill on your cardiac medications before your next appointment, please call your pharmacy*  Lab Work: NONE If you have labs (blood work) drawn today and your tests are completely normal, you will receive your results only by: MyChart Message (if you have MyChart) OR A paper copy in the mail If you have any lab test that is abnormal or we need to change your treatment, we will call you to review the results.  Testing/Procedures: NONE  Follow-Up: At [facility], you and your health needs are our priority.  As part of our continuing mission to provide you with exceptional heart care, our providers are all part of one team.  This team includes your primary Cardiologist (physician) and Advanced Practice Providers or APPs (Physician Assistants and Nurse Practitioners) who all work together to provide you with the care you need, when you need it.  Your next appointment:   KEEP SCHEDULED FOLLOW-UP  We recommend signing up for the patient portal called "MyChart".  Sign up information is provided on this After Visit Summary.  MyChart is used to connect with patients for Virtual Visits (Telemedicine).  Patients are able to view lab/test results, encounter notes, upcoming appointments, etc.  Non-urgent messages can be sent to your provider as well.   To learn more about what you can do with MyChart, go to [email or web address].   Other Instructions       1st Floor: - Lobby - Registration  - Pharmacy  - Lab - Cafe  2nd Floor: - PV Lab - Diagnostic Testing (echo, CT, nuclear med)  3rd Floor: - Vacant  4th Floor: - TCTS (cardiothoracic surgery) - AFib Clinic - Structural Heart Clinic - Vascular Surgery  - Vascular  Ultrasound  5th Floor: - [facility] (general and EP) - Clinical Pharmacy for coumadin, hypertension, lipid, weight-loss medications, and med management appointments    Valet parking services will be available as well.

## 2023-12-04 ENCOUNTER — Telehealth: Payer: Self-pay | Admitting: Physician Assistant

## 2023-12-04 ENCOUNTER — Encounter (HOSPITAL_COMMUNITY): Payer: Self-pay | Admitting: Internal Medicine

## 2023-12-04 NOTE — Telephone Encounter (Signed)
 Patient c/o Palpitations: STAT if patient c/o lightheadedness, shortness of breath, or chest pain  How long have you had palpitations/irregular HR/ Afib? Are you having the symptoms now? After lunch and then she noticed the PVC'S so she drank water and it helped but it was off and on for about an hour.   Are you currently experiencing lightheadedness, SOB or CP? No, just felling fatigue.   Do you have a history of afib (atrial fibrillation) or irregular heart rhythm? PVC'S  Have you checked your BP or HR? (document readings if available): HR 96 sitting down, HR 115 standing.    Are you experiencing any other symptoms? Still has then here and there when moving around. She'd like a callback to discuss concerns.

## 2023-12-04 NOTE — Telephone Encounter (Signed)
 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 She reported she walked 1/6 miles today and has had diarrhea (this was reported at [facility] yesterday). She ate 2 bananas, yogurt, drank a bottle of water, and feels much better now.  She is stressed about the PVCs earlier. Reiterated to her that she may have been dehydrated or her K may have been a little low due to diarrhea, and that is why fluids and K rich foods decreased her symptoms.  Instructed her to continue to stay hydrated, eat foods high in potassium, and to try to remain calm as stress (both emotional and physical) can exacerbate palpitations/PVCs.  Encouraged her to continue to monitor symptoms and to call if PVCs return and are bothersome. She was grateful for call and reassurance.

## 2023-12-05 ENCOUNTER — Inpatient Hospital Stay (HOSPITAL_BASED_OUTPATIENT_CLINIC_OR_DEPARTMENT_OTHER): Admitting: Hematology and Oncology

## 2023-12-05 ENCOUNTER — Inpatient Hospital Stay: Attending: Hematology and Oncology

## 2023-12-05 VITALS — BP 114/70 | HR 87 | Temp 97.9°F | Resp 14 | Wt 137.1 lb

## 2023-12-05 DIAGNOSIS — D473 Essential (hemorrhagic) thrombocythemia: Secondary | ICD-10-CM | POA: Diagnosis not present

## 2023-12-05 DIAGNOSIS — Z79899 Other long term (current) drug therapy: Secondary | ICD-10-CM | POA: Insufficient documentation

## 2023-12-05 DIAGNOSIS — D500 Iron deficiency anemia secondary to blood loss (chronic): Secondary | ICD-10-CM

## 2023-12-05 DIAGNOSIS — K5520 Angiodysplasia of colon without hemorrhage: Secondary | ICD-10-CM

## 2023-12-05 LAB — CMP (CANCER CENTER ONLY)
ALT: 40 U/L (ref 0–44)
AST: 29 U/L (ref 15–41)
Albumin: 4.7 g/dL (ref 3.5–5.0)
Alkaline Phosphatase: 87 U/L (ref 38–126)
Anion gap: 8 (ref 5–15)
BUN: 11 mg/dL (ref 8–23)
CO2: 28 mmol/L (ref 22–32)
Calcium: 10.4 mg/dL — ABNORMAL HIGH (ref 8.9–10.3)
Chloride: 103 mmol/L (ref 98–111)
Creatinine: 0.76 mg/dL (ref 0.44–1.00)
GFR, Estimated: >60 mL/min (ref >60)
Glucose, Bld: 119 mg/dL — ABNORMAL HIGH (ref 70–99)
Potassium: 3.7 mmol/L (ref 3.5–5.1)
Sodium: 139 mmol/L (ref 135–145)
Total Bilirubin: 0.6 mg/dL (ref 0.0–1.2)
Total Protein: 7.7 g/dL (ref 6.5–8.1)

## 2023-12-05 LAB — CBC WITH DIFFERENTIAL (CANCER CENTER ONLY)
Abs Immature Granulocytes: 0.03 10*3/uL (ref 0.00–0.07)
Basophils Absolute: 0.1 10*3/uL (ref 0.0–0.1)
Basophils Relative: 1 %
Eosinophils Absolute: 0 10*3/uL (ref 0.0–0.5)
Eosinophils Relative: 1 %
HCT: 37.7 % (ref 36.0–46.0)
Hemoglobin: 13 g/dL (ref 12.0–15.0)
Immature Granulocytes: 1 %
Lymphocytes Relative: 14 %
Lymphs Abs: 0.7 10*3/uL (ref 0.7–4.0)
MCH: 41.5 pg — ABNORMAL HIGH (ref 26.0–34.0)
MCHC: 34.5 g/dL (ref 30.0–36.0)
MCV: 120.4 fL — ABNORMAL HIGH (ref 80.0–100.0)
Monocytes Absolute: 0.5 10*3/uL (ref 0.1–1.0)
Monocytes Relative: 9 %
Neutro Abs: 3.9 10*3/uL (ref 1.7–7.7)
Neutrophils Relative %: 74 %
Platelet Count: 800 10*3/uL — ABNORMAL HIGH (ref 150–400)
RBC: 3.13 MIL/uL — ABNORMAL LOW (ref 3.87–5.11)
RDW: 14.7 % (ref 11.5–15.5)
WBC Count: 5.2 10*3/uL (ref 4.0–10.5)
nRBC: 0 % (ref 0.0–0.2)

## 2023-12-05 LAB — FERRITIN: Ferritin: 174 ng/mL (ref 11–307)

## 2023-12-05 NOTE — Progress Notes (Signed)
 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 Leonides Schanz  10/26/2021: WBC 6.7, Hgb 13.3, MCV 121.7, Plt 204 03/10/2022: WBC 3.7, Hgb 11.2, MCV 120, Plt 261. Decrease hydroxyurea to 1000 mg BID due to cytopenias.  06/24/2022-06/27/2022: Admitted with worsening GI bleed. Hydroxyurea held.  08/01/2022: WBC 4.4, hemoglobin 9.8, MCV 105.1, and platelets of 190 02/26/2023: WBC 7.3, Hgb 12.5, MCV 109.1, Plt 807. Increase hydroxyurea to 1000 mg daily with 1500 mg every other day 04/27/2023: WBC 4.8, Hgb 12.5, MCV 111.3, Plt 528. Increase hydroxyurea to 1500 mg daily 06/20/2023: Return dose to hydroxyurea 500 mg twice daily 07/17/2023: increased to1500 mg hydroxyurea daily (1000 in PM and 500 in AM)   Interval History:  Kristina Osborne 73 y.o. female with medical history significant for essential thrombocytosis who presents for a follow up visit. The patient's last visit was on 10/03/2023. In the interim since the last visit Kristina Osborne has continued 1500 mg hydroxyurea daily (1000 in PM and 500 in AM).  She also had a mitral valve clip on 11/28/2023.   On exam today Kristina Osborne reports her mitral valve procedure on 11/28/2023 went well.  She reports she was having some PVCs yesterday but overall is not having any major symptoms.  She is having some occasional shortness of breath.  She notes that she is having some fatigue and diarrhea as her 2 primary symptoms.  She reports that she is taking Plavix but has not noticed any bleeding yet.   She denies any maroon-colored stools or dark stools.  Reports she is also eating a brat diet to try to help with the diarrhea.  She would like to take Imodium but is afraid about the interaction with Plavix.  She reports that she is passing light dysphagia to solid formed stools.  Otherwise she is at her baseline level of health with no questions concerns or complaints today.  She denies any fevers, chills, sweats, nausea, vomiting or diarrhea.  A full 10 point ROS is listed below.  Today we discussed the increase in her platelets up to 800.  We discussed increasing her hydroxyurea to 1000 mg twice daily.  She was agreeable to this increase in treatment.   MEDICAL HISTORY:  Past Medical History:  Diagnosis Date   ABLA (acute blood loss anemia) 04/16/2022   Congenital malformation of intestinal fixation    Gastritis    Gastroesophageal reflux disease without esophagitis 09/10/2021   IDA (iron deficiency anemia)    Mitral valve prolapse    Persistent hyperplasia of thymus (HCC)    S/P Thymectomy   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 Lynnette Caffey   Thrombocytosis     SURGICAL HISTORY: Past Surgical History:  Procedure Laterality Date   BIOPSY  06/26/2022   Procedure: BIOPSY;  Surgeon: Jenel Lucks, MD;  Location: [facility];  Service: Gastroenterology;;   COLONOSCOPY N/A 04/17/2022   Procedure: COLONOSCOPY;  Surgeon: Beverley Fiedler, MD;  Location: [facility];  Service:  Gastroenterology;  Laterality: N/A;   COLONOSCOPY WITH PROPOFOL N/A 06/26/2022   Procedure: COLONOSCOPY WITH PROPOFOL;  Surgeon: Jenel Lucks, MD;  Location: [facility];  Service: Gastroenterology;  Laterality: N/A;   ENTEROSCOPY N/A 04/17/2022   Procedure: ENTEROSCOPY;  Surgeon: Beverley Fiedler, MD;  Location: [facility];  Service: Gastroenterology;  Laterality: N/A;   ENTEROSCOPY N/A 06/26/2022   Procedure:  ENTEROSCOPY;  Surgeon: Jenel Lucks, MD;  Location: [facility];  Service: Gastroenterology;  Laterality: N/A;   GIVENS CAPSULE STUDY N/A 06/26/2022   Procedure: GIVENS CAPSULE STUDY;  Surgeon: Jenel Lucks, MD;  Location: [facility];  Service: Gastroenterology;  Laterality: N/A;   HEMOSTASIS CLIP PLACEMENT  04/17/2022   Procedure: HEMOSTASIS CLIP PLACEMENT;  Surgeon: Beverley Fiedler, MD;  Location: [facility];  Service: Gastroenterology;;   HEMOSTASIS CLIP PLACEMENT  06/26/2022   Procedure: HEMOSTASIS CLIP PLACEMENT;  Surgeon: Jenel Lucks, MD;  Location: [facility];  Service: Gastroenterology;;   HOT HEMOSTASIS N/A 04/17/2022   Procedure: HOT HEMOSTASIS (ARGON PLASMA COAGULATION/BICAP);  Surgeon: Beverley Fiedler, MD;  Location: [facility];  Service: Gastroenterology;  Laterality: N/A;   HOT HEMOSTASIS N/A 06/26/2022   Procedure: HOT HEMOSTASIS (ARGON PLASMA COAGULATION/BICAP);  Surgeon: Jenel Lucks, MD;  Location: [facility];  Service: Gastroenterology;  Laterality: N/A;   POLYPECTOMY  04/17/2022   Procedure: POLYPECTOMY;  Surgeon: Beverley Fiedler, MD;  Location: [facility];  Service: Gastroenterology;;   POLYPECTOMY  06/26/2022   Procedure: POLYPECTOMY;  Surgeon: Jenel Lucks, MD;  Location: [facility];  Service: Gastroenterology;;   RIGHT/LEFT HEART CATH AND CORONARY ANGIOGRAPHY N/A 11/01/2023   Procedure: RIGHT/LEFT HEART CATH AND CORONARY ANGIOGRAPHY;  Surgeon: Iran Ouch, MD;  Location: [facility];  Service: Cardiovascular;  Laterality: N/A;   SMALL INTESTINE SURGERY  1994   SUBMUCOSAL TATTOO INJECTION  06/26/2022   Procedure: SUBMUCOSAL TATTOO INJECTION;  Surgeon: Jenel Lucks, MD;  Location: [facility];  Service: Gastroenterology;;   THYMECTOMY N/A    TRANSCATHETER MITRAL EDGE TO EDGE REPAIR N/A 11/28/2023   Procedure: TRANSCATHETER MITRAL EDGE TO EDGE REPAIR;  Surgeon: [name], MD;  Location: [facility];   Service: Cardiovascular;  Laterality: N/A;   TRANSESOPHAGEAL ECHOCARDIOGRAM (CATH LAB) N/A 10/24/2023   Procedure: TRANSESOPHAGEAL ECHOCARDIOGRAM;  Surgeon: [name], MD;  Location: [facility];  Service: Cardiovascular;  Laterality: N/A;   TRANSESOPHAGEAL ECHOCARDIOGRAM (CATH LAB) N/A 11/28/2023   Procedure: TRANSESOPHAGEAL ECHOCARDIOGRAM;  Surgeon: [name], MD;  Location: [facility];  Service: Cardiovascular;  Laterality: N/A;    SOCIAL HISTORY: Social History   Socioeconomic History   Marital status: Married    Spouse name: Not on file   Number of children: 0   Years of education: Not on file   Highest education level: Not on file  Occupational History   Not on file  Tobacco Use   Smoking status: Former    Current packs/day: 0.00    Types: Cigarettes    Quit date: 2002    Years since quitting: 23.2   Smokeless tobacco: Never   Tobacco comments:    Started smoking in her early 53's.    Smoked 0.5PP at her heaviest.   Vaping Use   Vaping status: Never Used  Substance and Sexual Activity   Alcohol use: Not Currently   Drug use: Never   Sexual activity: Yes    Partners: Male  Other Topics Concern   Not on  file  Social History Narrative   Not on file   Social Drivers of Health   Financial Resource Strain: Not on file  Food Insecurity: No Food Insecurity (11/28/2023)   Hunger Vital Sign    Worried About Running Out of Food in the Last Year: Never true    Ran Out of Food in the Last Year: Never true  Transportation Needs: No Transportation Needs (11/28/2023)   PRAPARE - Administrator, Civil Service (Medical): No    Lack of Transportation (Non-Medical): No  Physical Activity: Not on file  Stress: Not on file  Social Connections: Socially Integrated (11/28/2023)   Social Connection and Isolation Panel [NHANES]    Frequency of Communication with Friends and Family: More than three times a week    Frequency of Social Gatherings with  Friends and Family: Once a week    Attends Religious Services: More than 4 times per year    Active Member of [employer] or Organizations: Yes    Attends Engineer, structural: More than 4 times per year    Marital Status: Married  Catering manager Violence: Not At Risk (11/28/2023)   Humiliation, Afraid, Rape, and Kick questionnaire    Fear of Current or Ex-Partner: No    Emotionally Abused: No    Physically Abused: No    Sexually Abused: No    FAMILY HISTORY: Family History  Problem Relation Age of Onset   Heart disease Mother    Valvular heart disease Mother    Healthy Sister    Alcoholism Half-Sister     ALLERGIES:  is allergic to aspirin and sulfa antibiotics.  MEDICATIONS:  Current Outpatient Medications  Medication Sig Dispense Refill   acetaminophen (TYLENOL) 500 MG tablet Take 1,000 mg by mouth every 6 (six) hours as needed for headache (pain.).     amoxicillin (AMOXIL) 500 MG tablet Take 4 tablets (2,000 mg total) by mouth as directed. 1 hour prior to dental work including cleanings 12 tablet 12   cholestyramine light (PREVALITE) 4 g packet Take 2 g by mouth 2 (two) times daily.     clopidogrel (PLAVIX) 75 MG tablet Take 1 tablet (75 mg total) by mouth daily with breakfast. 90 tablet 1   famotidine (PEPCID) 40 MG tablet TAKE 1 TABLET(40 MG) BY MOUTH DAILY 90 tablet 0   folic acid (FOLVITE) 1 MG tablet TAKE 1 TABLET(1 MG) BY MOUTH DAILY 90 tablet 1   furosemide (LASIX) 40 MG tablet Take 1 tablet (40 mg total) by mouth as needed for fluid or edema. 90 tablet 3   Glucosamine-Chondroitin (COSAMIN DS PO) Take 1 tablet by mouth daily.     hydroxyurea (HYDREA) 500 MG capsule TAKE 1 CAPSULE(500 MG) BY MOUTH TWICE DAILY 180 capsule 1   Lycopene 10 MG CAPS Take 10 mg by mouth daily.     sucralfate (CARAFATE) 1 g tablet TAKE 1 TABLET(1 GRAM) BY MOUTH FOUR TIMES DAILY AT BEDTIME WITH MEALS (Patient taking differently: Take 1 g by mouth at bedtime.) 360 tablet 1   No current  facility-administered medications for this visit.    REVIEW OF SYSTEMS:   Constitutional: ( - ) fevers, ( - )  chills , ( - ) night sweats Eyes: ( - ) blurriness of vision, ( - ) double vision, ( - ) watery eyes Ears, nose, mouth, throat, and face: ( - ) mucositis, ( - ) sore throat Respiratory: ( - ) cough, ( - ) dyspnea, ( - ) wheezes  Cardiovascular: ( - ) palpitation, ( - ) chest discomfort, ( - ) lower extremity swelling Gastrointestinal:  ( - ) nausea, ( - ) heartburn, ( - ) change in bowel habits Skin: ( - ) abnormal skin rashes Lymphatics: ( - ) new lymphadenopathy, ( - ) easy bruising Neurological: ( - ) numbness, ( - ) tingling, ( - ) new weaknesses Behavioral/Psych: ( - ) mood change, ( - ) new changes  All other systems were reviewed with the patient and are negative.  PHYSICAL EXAMINATION:  Vitals:   12/05/23 0953  BP: 114/70  Pulse: 87  Resp: 14  Temp: 97.9 F (36.6 C)  SpO2: 100%      GENERAL: well appearing elderly Caucasian female, in NAD  SKIN: skin color, texture, turgor are normal, no rashes or significant lesions EYES: conjunctiva are pink and non-injected, sclera clear LUNGS: clear to auscultation and percussion with normal breathing effort HEART: regular rate & rhythm and no murmurs and no lower extremity edema Musculoskeletal: no cyanosis of digits and no clubbing  PSYCH: alert & oriented x 3, fluent speech NEURO: no focal motor/sensory deficits   LABORATORY DATA:  I have reviewed the data as listed    Latest Ref Rng & Units 12/05/2023    9:29 AM 11/29/2023    2:27 AM 11/26/2023   11:48 AM  CBC  WBC 4.0 - 10.5 K/uL 5.2  5.7  3.9   Hemoglobin 12.0 - 15.0 g/dL 62.1  30.8  65.7   Hematocrit 36.0 - 46.0 % 37.7  33.0  36.8   Platelets 150 - 400 K/uL 800  657  681        Latest Ref Rng & Units 12/05/2023    9:29 AM 11/29/2023    2:27 AM 11/28/2023    6:30 AM  CMP  Glucose 70 - 99 mg/dL 846  962  952   BUN 8 - 23 mg/dL 11  13  15    Creatinine 0.44 -  1.00 mg/dL 8.41  3.24  4.01   Sodium 135 - 145 mmol/L 139  138  140   Potassium 3.5 - 5.1 mmol/L 3.7  3.6  3.7   Chloride 98 - 111 mmol/L 103  106  106   CO2 22 - 32 mmol/L 28  23  26    Calcium 8.9 - 10.3 mg/dL 02.7  9.4  9.7   Total Protein 6.5 - 8.1 g/dL 7.7     Total Bilirubin 0.0 - 1.2 mg/dL 0.6     Alkaline Phos 38 - 126 U/L 87     AST 15 - 41 U/L 29     ALT 0 - 44 U/L 40      RADIOGRAPHIC STUDIES: ECHOCARDIOGRAM COMPLETE Result Date: 11/29/2023    ECHOCARDIOGRAM REPORT   Patient Name:   Kristina Osborne Date of Exam: 11/29/2023 Medical Rec #:  [id number]         Height:       66.0 in Accession #:    [id number]        Weight:       139.0 lb Date of Birth:  17-Sep-1950         BSA:          1.713 m Patient Age:    72 years          BP:           109/41 mmHg Patient Gender: F  HR:           75 bpm. Exam Location:  Inpatient Procedure: 2D Echo, Cardiac Doppler and Color Doppler (Both Spectral and Color            Flow Doppler were utilized during procedure). Indications:    S/P mitral valve repair  History:        Patient has prior history of Echocardiogram examinations, most                 recent 11/28/2023. Mitral Valve Disease.                  Mitral Valve: Mitra-Clip valve is present in the mitral                 position. Procedure Date: 11/29/23.  Sonographer:    Darlys Gales Referring Phys: [id number] KATHRYN R THOMPSON IMPRESSIONS  1. Left ventricular ejection fraction, by estimation, is 65 to 70%. The left ventricle has normal function. The left ventricle has no regional wall motion abnormalities. Left ventricular diastolic function could not be evaluated.  2. Right ventricular systolic function is normal. The right ventricular size is normal. Tricuspid regurgitation signal is inadequate for assessing PA pressure.  3. Left atrial size was moderately dilated.  4. Possible small iASD with left to right flow secondary to transeptal puncture.  5. 2 mitra-clips noted in the A2/P2 position  -appear stable. The mitral valve has been repaired/replaced. Trivial mitral valve regurgitation. Mild mitral stenosis. The mean mitral valve gradient is 6.0 mmHg with average heart rate of 74 bpm. Moderate mitral annular calcification. There is a Mitra-Clip present in the mitral position. Procedure Date: 11/29/23.  6. The aortic valve is tricuspid. Aortic valve regurgitation is trivial. Aortic valve sclerosis/calcification is present, without any evidence of aortic stenosis.  7. The inferior vena cava is normal in size with <50% respiratory variability, suggesting right atrial pressure of 8 mmHg. Comparison(s): Changes from prior study are noted. 11/28/2023: LVEF 65-70%. FINDINGS  Left Ventricle: Left ventricular ejection fraction, by estimation, is 65 to 70%. The left ventricle has normal function. The left ventricle has no regional wall motion abnormalities. The left ventricular internal cavity size was normal in size. There is  no left ventricular hypertrophy. Left ventricular diastolic function could not be evaluated due to mitral valve repair. Left ventricular diastolic function could not be evaluated. Right Ventricle: The right ventricular size is normal. No increase in right ventricular wall thickness. Right ventricular systolic function is normal. Tricuspid regurgitation signal is inadequate for assessing PA pressure. Left Atrium: Left atrial size was moderately dilated. Right Atrium: Right atrial size was normal in size. Pericardium: There is no evidence of pericardial effusion. Mitral Valve: 2 mitra-clips noted in the A2/P2 position -appear stable. The mitral valve has been repaired/replaced. Moderate mitral annular calcification. Trivial mitral valve regurgitation. There is a Mitra-Clip present in the mitral position. Procedure Date: 11/29/23. Mild mitral valve stenosis. MV peak gradient, 10.4 mmHg. The mean mitral valve gradient is 6.0 mmHg with average heart rate of 74 bpm. Tricuspid Valve: The tricuspid  valve is normal in structure. Tricuspid valve regurgitation is not demonstrated. Aortic Valve: The aortic valve is tricuspid. Aortic valve regurgitation is trivial. Aortic valve sclerosis/calcification is present, without any evidence of aortic stenosis. Aortic valve mean gradient measures 3.0 mmHg. Aortic valve peak gradient measures 5.0 mmHg. Aortic valve area, by VTI measures 2.40 cm. Pulmonic Valve: The pulmonic valve was grossly normal. Pulmonic valve regurgitation is trivial. Aorta:  The aortic root and ascending aorta are structurally normal, with no evidence of dilitation. Venous: The inferior vena cava is normal in size with less than 50% respiratory variability, suggesting right atrial pressure of 8 mmHg. IAS/Shunts: No atrial level shunt detected by color flow Doppler.  LEFT VENTRICLE PLAX 2D LVIDd:         5.50 cm   Diastology LVIDs:         3.90 cm   LV e' medial:    5.98 cm/s LV PW:         0.90 cm   LV E/e' medial:  26.4 LV IVS:        0.90 cm   LV e' lateral:   6.53 cm/s LVOT diam:     1.80 cm   LV E/e' lateral: 24.2 LV SV:         59 LV SV Index:   34 LVOT Area:     2.54 cm  RIGHT VENTRICLE RV S prime:     14.50 cm/s TAPSE (M-mode): 3.1 cm LEFT ATRIUM             Index        RIGHT ATRIUM           Index LA Vol (A2C):   78.7 ml 45.93 ml/m  RA Area:     18.30 cm LA Vol (A4C):   46.3 ml 27.02 ml/m  RA Volume:   49.70 ml  29.01 ml/m LA Biplane Vol: 64.7 ml 37.76 ml/m  AORTIC VALVE AV Area (Vmax):    2.34 cm AV Area (Vmean):   2.46 cm AV Area (VTI):     2.40 cm AV Vmax:           112.00 cm/s AV Vmean:          81.800 cm/s AV VTI:            0.244 m AV Peak Grad:      5.0 mmHg AV Mean Grad:      3.0 mmHg LVOT Vmax:         103.00 cm/s LVOT Vmean:        79.100 cm/s LVOT VTI:          0.230 m LVOT/AV VTI ratio: 0.94  AORTA Ao Root diam: 2.80 cm MITRAL VALVE MV Area (PHT): 2.29 cm     SHUNTS MV Area VTI:   1.25 cm     Systemic VTI:  0.23 m MV Peak grad:  10.4 mmHg    Systemic Diam: 1.80 cm MV  Mean grad:  6.0 mmHg MV Vmax:       1.62 m/s MV Vmean:      113.5 cm/s MV Decel Time: 331 msec MV E velocity: 158.00 cm/s MV A velocity: 108.00 cm/s MV E/A ratio:  1.46 Kristina Shutter MD Electronically signed by Kristina Shutter MD Signature Date/Time: 11/29/2023/11:39:33 AM    Final    ECHO TEE Result Date: 11/28/2023    TRANSESOPHOGEAL ECHO REPORT   Patient Name:   Kristina Osborne Date of Exam: 11/28/2023 Medical Rec #:  [id number]         Height:       66.0 in Accession #:    [id number]        Weight:       139.0 lb Date of Birth:  04/14/1951         BSA:          1.713 m Patient Age:    46  years          BP:           101/50 mmHg Patient Gender: F                 HR:           81 bpm. Exam Location:  Inpatient Procedure: Transesophageal Echo, 3D Echo, Cardiac Doppler and Color Doppler            (Both Spectral and Color Flow Doppler were utilized during            procedure). Indications:    MitraClip  History:        Patient has prior history of Echocardiogram examinations, most                 recent 10/24/2023. Mitral Valve Disease.  Sonographer:    Darlys Gales Referring Phys: [id number] [name] PROCEDURE: After discussion of the risks and benefits of a TEE, an informed consent was obtained from the patient. The transesophogeal probe was passed without difficulty through the esophogus of the patient. Sedation performed by different physician. The patient was monitored while under deep sedation. The patient developed no complications during the procedure.  IMPRESSIONS  1. Successful mTeer with two XTW clips placed in A2/P2 positions. Grade 1 MR with mean diastolic gradient 4 mmHg.  2. Left ventricular ejection fraction, by estimation, is 65 to 70%. The left ventricle has normal function.  3. Right ventricular systolic function is normal. The right ventricular size is normal.  4. Left atrial size was severely dilated. No left atrial/left atrial appendage thrombus was detected.  5. Right atrial size was mildly  dilated.  6. A small pericardial effusion is present. The pericardial effusion is localized near the right atrium.  7. Pre clip with flail P2 segment Extensive 3 D imaging perfomred to guide clip procedure Severe posterior annular MAC. First XTW clip placed between P2/A2 with decrease in grade MR from plus 4 to plus 2. A second XTW clip placed medial to first with decrease in MR grade to 1/4. Good bridge on 3 D imaging and documented leaflet capture on 2 D imaging. . The mitral valve has been repaired/replaced. Severe mitral valve regurgitation.  8. Tricuspid valve regurgitation is mild to moderate.  9. The aortic valve is tricuspid. Aortic valve regurgitation is not visualized. No aortic stenosis is present. 10. 3D performed of the mitral valve and demonstrates Extensive 3 D imaging of mitral valve perfomed to guide two XTW clip placement. 11. Left to right shunt post trans septal. FINDINGS  Left Ventricle: Left ventricular ejection fraction, by estimation, is 65 to 70%. The left ventricle has normal function. Strain was performed and the global longitudinal strain is indeterminate. The left ventricular internal cavity size was normal in size. Right Ventricle: The right ventricular size is normal. Right vetricular wall thickness was not assessed. Right ventricular systolic function is normal. Left Atrium: Left atrial size was severely dilated. No left atrial/left atrial appendage thrombus was detected. Right Atrium: Right atrial size was mildly dilated. Pericardium: A small pericardial effusion is present. The pericardial effusion is localized near the right atrium. Mitral Valve: Pre clip with flail P2 segment Extensive 3 D imaging perfomred to guide clip procedure Severe posterior annular MAC. First XTW clip placed between P2/A2 with decrease in grade MR from plus 4 to plus 2. A second XTW clip placed medial to first with decrease in MR grade to 1/4. Good bridge on  3 D imaging and documented leaflet capture on 2 D  imaging. The mitral valve has been repaired/replaced. Severe mitral valve regurgitation. MV peak gradient, 8.9 mmHg. The mean mitral valve gradient is 4.0 mmHg. Tricuspid Valve: The tricuspid valve is normal in structure. Tricuspid valve regurgitation is mild to moderate. Aortic Valve: The aortic valve is tricuspid. Aortic valve regurgitation is not visualized. No aortic stenosis is present. Pulmonic Valve: The pulmonic valve was normal in structure. Pulmonic valve regurgitation is mild. Aorta: The aortic root is normal in size and structure. IAS/Shunts: Left to right shunt post trans septal. Additional Comments: Successful mTeer with two XTW clips placed in A2/P2 positions. Grade 1 MR with mean diastolic gradient 4 mmHg. 3D was performed not requiring image post processing on an independent workstation and was abnormal. LEFT VENTRICLE PLAX 2D LVOT diam:     1.70 cm LV SV:         41 LV SV Index:   24 LVOT Area:     2.27 cm  AORTIC VALVE LVOT Vmax:   85.70 cm/s LVOT Vmean:  54.000 cm/s LVOT VTI:    0.180 m MITRAL VALVE MV Area VTI:  1.10 cm        SHUNTS MV Peak grad: 8.9 mmHg        Systemic VTI:  0.18 m MV Mean grad: 4.0 mmHg        Systemic Diam: 1.70 cm MV Vmax:      1.49 m/s MV Vmean:     93.3 cm/s MR Peak grad:    71.6 mmHg MR Mean grad:    43.0 mmHg MR Vmax:         423.00 cm/s MR Vmean:        312.0 cm/s MR PISA:         9.05 cm MR PISA Eff ROA: 82 mm MR PISA Radius:  1.20 cm Charlton Haws MD Electronically signed by Charlton Haws MD Signature Date/Time: 11/28/2023/2:53:54 PM    Final    DG Chest 2 View Result Date: 11/28/2023 CLINICAL DATA:  Severe mitral regurgitation. Mitral annular calcification. Mitral valve prolapse. Preop. EXAM: CHEST - 2 VIEW COMPARISON:  Radiograph and CT 10/30/2023 FINDINGS: Prior median sternotomy. Stable heart size and mediastinal contours. Mitral annulus calcifications. No focal airspace disease, pulmonary edema, or pleural effusion. No acute osseous abnormalities. IMPRESSION:  Prior median sternotomy. Mitral annulus calcifications. No acute findings. Electronically Signed   By: Narda Rutherford M.D.   On: 11/28/2023 14:05   EP STUDY Result Date: 11/28/2023 See surgical note for result.  Structural Heart Procedure Result Date: 11/28/2023 See surgical note for result.  LONG TERM MONITOR (3-14 DAYS) Result Date: 11/23/2023 Patch Wear Time:  4 days and 10 hours (2025-03-01T07:37:40-0500 to 2025-03-05T17:50:24-0500) Patient had a min HR of 59 bpm, max HR of 169 bpm, and avg HR of 86 bpm. Predominant underlying rhythm was Sinus Rhythm. 6 Supraventricular Tachycardia runs occurred, the run with the fastest interval lasting 5 beats with a max rate of 169 bpm, the longest lasting 7 beats with an avg rate of 142 bpm. Isolated SVEs were rare (<1.0%), SVE Couplets were rare (<1.0%), and SVE Triplets were rare (<1.0%). Isolated VEs were rare (<1.0%), VE Couplets were rare (<1.0%), and no VE Triplets were present. Ventricular Bigeminy was present. Monitor showed sinus rhythm with 6 PSVT runs (less than 5 beats). PAC's and PVC's were noted, occasionally in bigeminy. [name], MD, [facility], FACP [place]  [facility] Medical Director of the  [facility] Diplomate of the American Board of Clinical Lipidology Attending Cardiologist Direct Dial: [phone]  Fax: [phone] Website:  [email or web address]   ASSESSMENT & PLAN Kristina Osborne 73 y.o. female with medical history significant for essential thrombocytosis who presents for a follow up visit.   After review of the labs, review of the records, and discussion with the patient the patients findings are most consistent with thrombocytosis, reported due to essential thrombocythemia. We have received the records showing that she has a JAK2 mutation.  Additionally she will require a bone marrow biopsy in order to assure that essential thrombocythemia is the diagnosis.  In the  interim we will have the patient continue her hydroxyurea as previously prescribed by her provider in [place].  # Essential thrombocythemia, JAK2 Positive  -- At this time we will need to confirm that the patient has essential thrombocythemia.  She will require a bone marrow biopsy to confirm this diagnosis --JAK2 testing received from patient's prior hematologist. Confirmed JAK2 mutation. Plan:   -- If and when the patient is agreeable we will schedule her for a bone marrow biopsy. --Hydroxyurea was previously held due to GI bleed and anemia.  Anemia is improving with IV iron therapy and therefore we can cotinue hydroxyurea at this time. --previously on 1500 mg hydroxyurea daily (1000 in PM and 500 in AM) .  Will increase to 1000 mg twice daily as her platelets are currently above target. -- Labs today show WBC 5.2, hemoglobin 13.0, MCV 120.4, platelets 800 --continue with labs q 2 weeks followed by a clinic visit in 8 weeks.  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  More recent doses were performed on 11/07/2022, 01/18/2023, and most recently 06/26/2023. Feraheme administered on 2/7-2/14/2025.  --Patient has history of GI bleeding. Patient notes no active maroon/red/black stools.  Appreciate assistance of GI in the management of this case. --Labs today as noted above.  -- If the patient were to have recurrent anemia or persistent iron deficiency will order another dose of IV iron --continue to follow with above labs.   No orders of the defined types were placed in this encounter.   All questions were answered. The patient knows to call the clinic with any problems, questions or concerns.  A total of more than 30 minutes were spent on this encounter with face-to-face time and non-face-to-face time, including preparing to see the patient, ordering tests  and/or medications, counseling the patient and coordination of care as outlined above.   Ulysees Barns, MD Department of Hematology/Oncology [facility] Phone: [phone] Pager: [phone] Email: [email or web address]  12/05/2023 11:07 AM

## 2023-12-14 ENCOUNTER — Telehealth (HOSPITAL_COMMUNITY): Payer: Self-pay

## 2023-12-14 NOTE — Telephone Encounter (Signed)
 Called patient to see if she was interested in participating in the Cardiac Rehab Program. Patient will come in for orientation on 4/24@1 :15 and will attend the 10:15 exercise class.

## 2023-12-19 ENCOUNTER — Inpatient Hospital Stay

## 2023-12-19 ENCOUNTER — Encounter (HOSPITAL_COMMUNITY): Payer: Self-pay | Admitting: Internal Medicine

## 2023-12-19 DIAGNOSIS — D473 Essential (hemorrhagic) thrombocythemia: Secondary | ICD-10-CM

## 2023-12-19 LAB — CBC WITH DIFFERENTIAL (CANCER CENTER ONLY)
Abs Immature Granulocytes: 0 10*3/uL (ref 0.00–0.07)
Basophils Absolute: 0 10*3/uL (ref 0.0–0.1)
Basophils Relative: 1 %
Eosinophils Absolute: 0 10*3/uL (ref 0.0–0.5)
Eosinophils Relative: 0 %
HCT: 35.6 % — ABNORMAL LOW (ref 36.0–46.0)
Hemoglobin: 12.6 g/dL (ref 12.0–15.0)
Immature Granulocytes: 0 %
Lymphocytes Relative: 13 %
Lymphs Abs: 0.5 10*3/uL — ABNORMAL LOW (ref 0.7–4.0)
MCH: 43.2 pg — ABNORMAL HIGH (ref 26.0–34.0)
MCHC: 35.4 g/dL (ref 30.0–36.0)
MCV: 121.9 fL — ABNORMAL HIGH (ref 80.0–100.0)
Monocytes Absolute: 0.3 10*3/uL (ref 0.1–1.0)
Monocytes Relative: 9 %
Neutro Abs: 2.8 10*3/uL (ref 1.7–7.7)
Neutrophils Relative %: 77 %
Platelet Count: 299 10*3/uL (ref 150–400)
RBC: 2.92 MIL/uL — ABNORMAL LOW (ref 3.87–5.11)
RDW: 14.2 % (ref 11.5–15.5)
WBC Count: 3.6 10*3/uL — ABNORMAL LOW (ref 4.0–10.5)
nRBC: 0 % (ref 0.0–0.2)

## 2023-12-19 LAB — CMP (CANCER CENTER ONLY)
ALT: 17 U/L (ref 0–44)
AST: 20 U/L (ref 15–41)
Albumin: 4.5 g/dL (ref 3.5–5.0)
Alkaline Phosphatase: 68 U/L (ref 38–126)
Anion gap: 4 — ABNORMAL LOW (ref 5–15)
BUN: 14 mg/dL (ref 8–23)
CO2: 30 mmol/L (ref 22–32)
Calcium: 9.9 mg/dL (ref 8.9–10.3)
Chloride: 104 mmol/L (ref 98–111)
Creatinine: 0.74 mg/dL (ref 0.44–1.00)
GFR, Estimated: >60 mL/min (ref >60)
Glucose, Bld: 99 mg/dL (ref 70–99)
Potassium: 4.2 mmol/L (ref 3.5–5.1)
Sodium: 138 mmol/L (ref 135–145)
Total Bilirubin: 0.5 mg/dL (ref 0.0–1.2)
Total Protein: 7 g/dL (ref 6.5–8.1)

## 2023-12-19 LAB — FERRITIN: Ferritin: 209 ng/mL (ref 11–307)

## 2023-12-20 ENCOUNTER — Encounter (HOSPITAL_COMMUNITY)
Admission: RE | Admit: 2023-12-20 | Discharge: 2023-12-20 | Disposition: A | Discharge status: Home / Self Care | Source: Ambulatory Visit | Attending: Internal Medicine | Admitting: Internal Medicine

## 2023-12-20 VITALS — BP 104/60 | HR 75 | Ht 66.0 in | Wt 142.4 lb

## 2023-12-20 DIAGNOSIS — Z9889 Other specified postprocedural states: Secondary | ICD-10-CM | POA: Insufficient documentation

## 2023-12-20 NOTE — Progress Notes (Signed)
 Cardiac Rehab Medication Review   Does the patient  feel that his/her medications are working for him/her?  yes  Has the patient been experiencing any side effects to the medications prescribed?  no  Does the patient measure his/her own blood pressure or blood glucose at home?  unknown  Does the patient have any problems obtaining medications due to transportation or finances?   no  Understanding of regimen: excellent Understanding of indications: excellent Potential of compliance: excellent    Comments: Patient feels good about her medication routine.     Raju Coppolino S Arraya Buck 12/20/2023 4:34 PM

## 2023-12-20 NOTE — Progress Notes (Signed)
 Cardiac Individual Treatment Plan  Patient Details  Name: Kristina Osborne MRN: [id number] Date of Birth: [birth year] Referring Provider:   Flowsheet Row INTENSIVE CARDIAC REHAB ORIENT from 12/20/2023 in [facility]  Referring Provider Dinah Franco, MD       Initial Encounter Date:  Flowsheet Row INTENSIVE CARDIAC REHAB ORIENT from 12/20/2023 in [facility]  Date 12/20/23       Visit Diagnosis: 11/28/23 mitraclip  Patient's Home Medications on Admission:  Current Outpatient Medications:    acetaminophen  (TYLENOL ) 500 MG tablet, Take 1,000 mg by mouth every 6 (six) hours as needed for headache (pain.)., Disp: , Rfl:    cholestyramine  light (PREVALITE ) 4 g packet, Take 2 g by mouth 2 (two) times daily., Disp: , Rfl:    clopidogrel  (PLAVIX ) 75 MG tablet, Take 1 tablet (75 mg total) by mouth daily with breakfast., Disp: 90 tablet, Rfl: 1   famotidine  (PEPCID ) 40 MG tablet, TAKE 1 TABLET(40 MG) BY MOUTH DAILY, Disp: 90 tablet, Rfl: 0   folic acid  (FOLVITE ) 1 MG tablet, TAKE 1 TABLET(1 MG) BY MOUTH DAILY, Disp: 90 tablet, Rfl: 1   furosemide  (LASIX ) 40 MG tablet, Take 1 tablet (40 mg total) by mouth as needed for fluid or edema., Disp: 90 tablet, Rfl: 3   hydroxyurea  (HYDREA ) 500 MG capsule, TAKE 1 CAPSULE(500 MG) BY MOUTH TWICE DAILY, Disp: 180 capsule, Rfl: 1   sucralfate  (CARAFATE ) 1 g tablet, TAKE 1 TABLET(1 GRAM) BY MOUTH FOUR TIMES DAILY AT BEDTIME WITH MEALS (Patient taking differently: Take 1 g by mouth at bedtime.), Disp: 360 tablet, Rfl: 1   amoxicillin  (AMOXIL ) 500 MG tablet, Take 4 tablets (2,000 mg total) by mouth as directed. 1 hour prior to dental work including cleanings (Patient not taking: Reported on 12/20/2023), Disp: 12 tablet, Rfl: 12   Glucosamine-Chondroitin (COSAMIN DS PO), Take 1 tablet by mouth daily. (Patient not taking: Reported on 12/20/2023), Disp:  , Rfl:    Lycopene 10 MG CAPS, Take 10 mg by mouth daily. 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 Lorie Rook   Thrombocytosis     Tobacco Use: Social History   Tobacco Use  Smoking Status Former   Current packs/day: 0.00   Types: Cigarettes   Quit date: 2002   Years since quitting: 23.3  Smokeless Tobacco Never  Tobacco Comments   Started smoking in her early 20's.   Smoked 0.5PP at her heaviest.     Labs: Review Flowsheet       Latest Ref Rng & Units 04/16/2022 10/24/2023 11/01/2023  Labs for ITP Cardiac and Pulmonary Rehab  Hemoglobin A1c 4.8 - 5.6 % 4.4  - -  PH, Arterial 7.35 - 7.45 - - 7.401   PCO2 arterial 32 - 48 mmHg - - 38.9   Bicarbonate 20.0 - 28.0 mmol/L - - 25.3  24.2   TCO2 22 - 32 mmol/L -  20  27  25    O2 Saturation % - - 73  92     Details       Multiple values from one day are sorted in reverse-chronological order         Capillary Blood Glucose: No results found for: "GLUCAP"   Exercise Target Goals: Exercise Program Goal: Individual exercise prescription set using results from initial 6 min walk test and THRR while considering  patient's activity barriers and safety.   Exercise Prescription Goal: Initial exercise prescription builds to 30-45 minutes a day of aerobic activity, 2-3 days per week.  Home exercise guidelines will be given to patient during program as part of exercise prescription that the participant will acknowledge.  Activity Barriers &  Risk Stratification:  Activity Barriers & Cardiac Risk Stratification - 12/20/23 1605       Activity Barriers & Cardiac Risk Stratification   Activity Barriers Deconditioning;History of Falls    Cardiac Risk Stratification High             6 Minute Walk:  6 Minute Walk     Row Name 12/20/23 1601         6 Minute Walk   Phase Initial     Distance 1745 feet     Walk Time 6 minutes     # of Rest Breaks 0     MPH 3.3     METS 3.64     RPE 7     Perceived Dyspnea  0     VO2 Peak 12.75     Symptoms Yes (comment)     Comments Post walk test patient states she felt "swimmy headed", patient denigns SOB, light headed feeling or visual changes. Resolved within 30 seconds. Orthostatics seated 98/63 standing 93/63 asymptomatic.     Resting HR 75 bpm     Resting BP 104/60     Resting Oxygen Saturation  98 %     Exercise Oxygen Saturation  during 6 min walk 100 %     Max Ex. HR 88 bpm     Max Ex. BP 116/62     2 Minute Post BP 100/60              Oxygen Initial Assessment:   Oxygen Re-Evaluation:   Oxygen Discharge (Final Oxygen Re-Evaluation):   Initial Exercise Prescription:  Initial Exercise Prescription - 12/20/23 1500       Date of Initial Exercise RX and Referring Provider   Date 12/20/23    Referring Provider Dinah Franco, MD    Expected Discharge Date 03/12/24      NuStep   Level 1    SPM 80    Minutes 15    METs 1.8      Recumbant Elliptical   Level 1    RPM 40    Watts 60    Minutes 15    METs 2.1      Prescription Details   Frequency (times per week) 3    Duration Progress to 30 minutes of continuous aerobic without signs/symptoms of physical distress      Intensity   THRR 40-80% of Max Heartrate 60-119    Ratings of Perceived Exertion 11-13    Perceived Dyspnea 0-4      Progression   Progression Continue progressive overload as per policy without signs/symptoms or physical distress.      Resistance Training   Training  Prescription Yes    Reps 10-15  Perform Capillary Blood Glucose checks as needed.  Exercise Prescription Changes:   Exercise Comments:   Exercise Goals and Review:   Exercise Goals     Row Name 12/20/23 1528             Exercise Goals   Increase Physical Activity Yes       Intervention Provide advice, education, support and counseling about physical activity/exercise needs.;Develop an individualized exercise prescription for aerobic and resistive training based on initial evaluation findings, risk stratification, comorbidities and participant's personal goals.       Expected Outcomes Short Term: Attend rehab on a regular basis to increase amount of physical activity.;Long Term: Exercising regularly at least 3-5 days a week.;Long Term: Add in home exercise to make exercise part of routine and to increase amount of physical activity.       Increase Strength and Stamina Yes       Intervention Provide advice, education, support and counseling about physical activity/exercise needs.;Develop an individualized exercise prescription for aerobic and resistive training based on initial evaluation findings, risk stratification, comorbidities and participant's personal goals.       Expected Outcomes Short Term: Increase workloads from initial exercise prescription for resistance, speed, and METs.;Short Term: Perform resistance training exercises routinely during rehab and add in resistance training at home;Long Term: Improve cardiorespiratory fitness, muscular endurance and strength as measured by increased METs and functional capacity ( )       Able to understand and use rate of perceived exertion (RPE) scale Yes       Intervention Provide education and explanation on how to use RPE scale       Expected Outcomes Long Term:  Able to use RPE to guide intensity level when exercising independently;Short Term: Able to use RPE daily in rehab to express subjective intensity level        Knowledge and understanding of Target Heart Rate Range (THRR) Yes       Intervention Provide education and explanation of THRR including how the numbers were predicted and where they are located for reference       Expected Outcomes Short Term: Able to state/look up THRR;Short Term: Able to use daily as guideline for intensity in rehab;Long Term: Able to use THRR to govern intensity when exercising independently       Understanding of Exercise Prescription Yes       Intervention Provide education, explanation, and written materials on patient's individual exercise prescription       Expected Outcomes Short Term: Able to explain program exercise prescription;Long Term: Able to explain home exercise prescription to exercise independently                Exercise Goals Re-Evaluation :   Discharge Exercise Prescription (Final Exercise Prescription Changes):   Nutrition:  Target Goals: Understanding of nutrition guidelines, daily intake of sodium 1500mg , cholesterol 200mg , calories 30% from fat and 7% or less from saturated fats, daily to have 5 or more servings of fruits and vegetables.  Biometrics:   Post Biometrics - 12/20/23 1527        Post  Biometrics   Waist Circumference 30.5 inches    Hip Circumference 38 inches    Waist to Hip Ratio 0.8 %    Triceps Skinfold 21 mm    % Body Fat 33.4 %    Grip Strength 20 kg    Flexibility 18.25 in    Single Leg Stand 30 seconds  Nutrition Therapy Plan and Nutrition Goals:   Nutrition Assessments:  MEDIFICTS Score Key: >=70 Need to make dietary changes  40-70 Heart Healthy Diet <= 40 Therapeutic Level Cholesterol Diet    Picture Your Plate Scores: <78 Unhealthy dietary pattern with much room for improvement. 41-50 Dietary pattern unlikely to meet recommendations for good health and room for improvement. 51-60 More healthful dietary pattern, with some room for improvement.  >60 Healthy dietary pattern,  although there may be some specific behaviors that could be improved.    Nutrition Goals Re-Evaluation:   Nutrition Goals Re-Evaluation:   Nutrition Goals Discharge (Final Nutrition Goals Re-Evaluation):   Psychosocial: Target Goals: Acknowledge presence or absence of significant depression and/or stress, maximize coping skills, provide positive support system. Participant is able to verbalize types and ability to use techniques and skills needed for reducing stress and depression.  Initial Review & Psychosocial Screening:  Initial Psych Review & Screening - 12/20/23 1617       Initial Review   Current issues with Current Sleep Concerns;Current Stress Concerns    Source of Stress Concerns Chronic Illness;Family;Financial;Unable to perform yard/household activities    Comments She is feeling some stress with her family needs and issues with chronic fatigue. She's having a hard time with stamina to keep up an active lifestyle. Not interested in counseling at this time, but she knows to ask any time for additional resources      Family Dynamics   Good Support System? Yes      Barriers   Psychosocial barriers to participate in program The patient should benefit from training in stress management and relaxation.;Psychosocial barriers identified (see note)      Screening Interventions   Interventions Encouraged to exercise;To provide support and resources with identified psychosocial needs;Provide feedback about the scores to participant    Expected Outcomes Long Term Goal: Stressors or current issues are controlled or eliminated.;Short Term goal: Identification and review with participant of any Quality of Life or Depression concerns found by scoring the questionnaire.;Long Term goal: The participant improves quality of Life and PHQ9 Scores as seen by post scores and/or verbalization of changes             Quality of Life Scores:  Quality of Life - 12/20/23 1621       Quality  of Life   Select Quality of Life      Quality of Life Scores   Health/Function Pre 18.03 %    Socioeconomic Pre 25.25 %    Psych/Spiritual Pre 16.5 %    Family Pre 21 %    GLOBAL Pre 19.42 %            Scores of 19 and below usually indicate a poorer quality of life in these areas.  A difference of  2-3 points is a clinically meaningful difference.  A difference of 2-3 points in the total score of the Quality of Life Index has been associated with significant improvement in overall quality of life, self-image, physical symptoms, and general health in studies assessing change in quality of life.  PHQ-9: Review Flowsheet  More data exists      12/20/2023 10/18/2023 09/06/2023 08/09/2023 06/26/2023  Depression screen PHQ 2/9  Decreased Interest 0 0 0 0 0  Down, Depressed, Hopeless 0 0 0 0 0  PHQ - 2 Score 0 0 0 0 0  Altered sleeping 2 - - - -  Tired, decreased energy 3 - - - -  Change in appetite 0 - - - -  Feeling bad or failure about yourself  0 - - - -  Trouble concentrating 0 - - - -  Moving slowly or fidgety/restless 0 - - - -  Suicidal thoughts 0 - - - -  PHQ-9 Score 5 - - - -  Difficult doing work/chores Somewhat difficult - - - -   Interpretation of Total Score  Total Score Depression Severity:  1-4 = Minimal depression, 5-9 = Mild depression, 10-14 = Moderate depression, 15-19 = Moderately severe depression, 20-27 = Severe depression   Psychosocial Evaluation and Intervention:   Psychosocial Re-Evaluation:   Psychosocial Discharge (Final Psychosocial Re-Evaluation):   Vocational Rehabilitation: Provide vocational rehab assistance to qualifying candidates.   Vocational Rehab Evaluation & Intervention:  Vocational Rehab - 12/20/23 1529       Initial Vocational Rehab Evaluation & Intervention   Assessment shows need for Vocational Rehabilitation No   retired            Education: Education Goals: Education classes will be provided on a weekly basis,  covering required topics. Participant will state understanding/return demonstration of topics presented.     Core Videos: Exercise    Move It!  Clinical staff conducted group or individual video education with verbal and written material and guidebook.  Patient learns the recommended Pritikin exercise program. Exercise with the goal of living a long, healthy life. Some of the health benefits of exercise include controlled diabetes, healthier blood pressure levels, improved cholesterol levels, improved heart and lung capacity, improved sleep, and better body composition. Everyone should speak with their doctor before starting or changing an exercise routine.  Biomechanical Limitations Clinical staff conducted group or individual video education with verbal and written material and guidebook.  Patient learns how biomechanical limitations can impact exercise and how we can mitigate and possibly overcome limitations to have an impactful and balanced exercise routine.  Body Composition Clinical staff conducted group or individual video education with verbal and written material and guidebook.  Patient learns that body composition (ratio of muscle mass to fat mass) is a key component to assessing overall fitness, rather than body weight alone. Increased fat mass, especially visceral belly fat, can put us  at increased risk for metabolic syndrome, type 2 diabetes, heart disease, and even death. It is recommended to combine diet and exercise (cardiovascular and resistance training) to improve your body composition. Seek guidance from your physician and exercise physiologist before implementing an exercise routine.  Exercise Action Plan Clinical staff conducted group or individual video education with verbal and written material and guidebook.  Patient learns the recommended strategies to achieve and enjoy long-term exercise adherence, including variety, self-motivation, self-efficacy, and positive  decision making. Benefits of exercise include fitness, good health, weight management, more energy, better sleep, less stress, and overall well-being.  Medical   Heart Disease Risk Reduction Clinical staff conducted group or individual video education with verbal and written material and guidebook.  Patient learns our heart is our most vital organ as it circulates oxygen, nutrients, white blood cells, and hormones throughout the entire body, and carries waste away. Data supports a plant-based eating plan like the Pritikin Program for its effectiveness in slowing progression of and reversing heart disease. The video provides a number of recommendations to address heart disease.   Metabolic Syndrome and Belly Fat  Clinical staff conducted group or individual video education with verbal and written material and guidebook.  Patient learns what metabolic syndrome is, how it leads to heart disease, and how one can  reverse it and keep it from coming back. You have metabolic syndrome if you have 3 of the following 5 criteria: abdominal obesity, high blood pressure, high triglycerides, low HDL cholesterol, and high blood sugar.  Hypertension and Heart Disease Clinical staff conducted group or individual video education with verbal and written material and guidebook.  Patient learns that high blood pressure, or hypertension, is very common in the [place] . Hypertension is largely due to excessive salt intake, but other important risk factors include being overweight, physical inactivity, drinking too much alcohol, smoking, and not eating enough potassium from fruits and vegetables. High blood pressure is a leading risk factor for heart attack, stroke, congestive heart failure, dementia, kidney failure, and premature death. Long-term effects of excessive salt intake include stiffening of the arteries and thickening of heart muscle and organ damage. Recommendations include ways to reduce hypertension and the  risk of heart disease.  Diseases of Our Time - Focusing on Diabetes Clinical staff conducted group or individual video education with verbal and written material and guidebook.  Patient learns why the best way to stop diseases of our time is prevention, through food and other lifestyle changes. Medicine (such as prescription pills and surgeries) is often only a Band-Aid on the problem, not a long-term solution. Most common diseases of our time include obesity, type 2 diabetes, hypertension, heart disease, and cancer. The Pritikin Program is recommended and has been proven to help reduce, reverse, and/or prevent the damaging effects of metabolic syndrome.  Nutrition   Overview of the Pritikin Eating Plan  Clinical staff conducted group or individual video education with verbal and written material and guidebook.  Patient learns about the Pritikin Eating Plan for disease risk reduction. The Pritikin Eating Plan emphasizes a wide variety of unrefined, minimally-processed carbohydrates, like fruits, vegetables, whole grains, and legumes. Go, Caution, and Stop food choices are explained. Plant-based and lean animal proteins are emphasized. Rationale provided for low sodium intake for blood pressure control, low added sugars for blood sugar stabilization, and low added fats and oils for coronary artery disease risk reduction and weight management.  Calorie Density  Clinical staff conducted group or individual video education with verbal and written material and guidebook.  Patient learns about calorie density and how it impacts the Pritikin Eating Plan. Knowing the characteristics of the food you choose will help you decide whether those foods will lead to weight gain or weight loss, and whether you want to consume more or less of them. Weight loss is usually a side effect of the Pritikin Eating Plan because of its focus on low calorie-dense foods.  Label Reading  Clinical staff conducted group or  individual video education with verbal and written material and guidebook.  Patient learns about the Pritikin recommended label reading guidelines and corresponding recommendations regarding calorie density, added sugars, sodium content, and whole grains.  Dining Out - Part 1  Clinical staff conducted group or individual video education with verbal and written material and guidebook.  Patient learns that restaurant meals can be sabotaging because they can be so high in calories, fat, sodium, and/or sugar. Patient learns recommended strategies on how to positively address this and avoid unhealthy pitfalls.  Facts on Fats  Clinical staff conducted group or individual video education with verbal and written material and guidebook.  Patient learns that lifestyle modifications can be just as effective, if not more so, as many medications for lowering your risk of heart disease. A Pritikin lifestyle can help to reduce  your risk of inflammation and atherosclerosis (cholesterol build-up, or plaque, in the artery walls). Lifestyle interventions such as dietary choices and physical activity address the cause of atherosclerosis. A review of the types of fats and their impact on blood cholesterol levels, along with dietary recommendations to reduce fat intake is also included.  Nutrition Action Plan  Clinical staff conducted group or individual video education with verbal and written material and guidebook.  Patient learns how to incorporate Pritikin recommendations into their lifestyle. Recommendations include planning and keeping personal health goals in mind as an important part of their success.  Healthy Mind-Set    Healthy Minds, Bodies, Hearts  Clinical staff conducted group or individual video education with verbal and written material and guidebook.  Patient learns how to identify when they are stressed. Video will discuss the impact of that stress, as well as the many benefits of stress management.  Patient will also be introduced to stress management techniques. The way we think, act, and feel has an impact on our hearts.  How Our Thoughts Can Heal Our Hearts  Clinical staff conducted group or individual video education with verbal and written material and guidebook.  Patient learns that negative thoughts can cause depression and anxiety. This can result in negative lifestyle behavior and serious health problems. Cognitive behavioral therapy is an effective method to help control our thoughts in order to change and improve our emotional outlook.  Additional Videos:  Exercise    Improving Performance  Clinical staff conducted group or individual video education with verbal and written material and guidebook.  Patient learns to use a non-linear approach by alternating intensity levels and lengths of time spent exercising to help burn more calories and lose more body fat. Cardiovascular exercise helps improve heart health, metabolism, hormonal balance, blood sugar control, and recovery from fatigue. Resistance training improves strength, endurance, balance, coordination, reaction time, metabolism, and muscle mass. Flexibility exercise improves circulation, posture, and balance. Seek guidance from your physician and exercise physiologist before implementing an exercise routine and learn your capabilities and proper form for all exercise.  Introduction to Yoga  Clinical staff conducted group or individual video education with verbal and written material and guidebook.  Patient learns about yoga, a discipline of the coming together of mind, breath, and body. The benefits of yoga include improved flexibility, improved range of motion, better posture and core strength, increased lung function, weight loss, and positive self-image. Yoga's heart health benefits include lowered blood pressure, healthier heart rate, decreased cholesterol and triglyceride levels, improved immune function, and reduced stress.  Seek guidance from your physician and exercise physiologist before implementing an exercise routine and learn your capabilities and proper form for all exercise.  Medical   Aging: Enhancing Your Quality of Life  Clinical staff conducted group or individual video education with verbal and written material and guidebook.  Patient learns key strategies and recommendations to stay in good physical health and enhance quality of life, such as prevention strategies, having an advocate, securing a Health Care Proxy and Power of Attorney, and keeping a list of medications and system for tracking them. It also discusses how to avoid risk for bone loss.  Biology of Weight Control  Clinical staff conducted group or individual video education with verbal and written material and guidebook.  Patient learns that weight gain occurs because we consume more calories than we burn (eating more, moving less). Even if your body weight is normal, you may have higher ratios of fat compared to  muscle mass. Too much body fat puts you at increased risk for cardiovascular disease, heart attack, stroke, type 2 diabetes, and obesity-related cancers. In addition to exercise, following the Pritikin Eating Plan can help reduce your risk.  Decoding Lab Results  Clinical staff conducted group or individual video education with verbal and written material and guidebook.  Patient learns that lab test reflects one measurement whose values change over time and are influenced by many factors, including medication, stress, sleep, exercise, food, hydration, pre-existing medical conditions, and more. It is recommended to use the knowledge from this video to become more involved with your lab results and evaluate your numbers to speak with your doctor.   Diseases of Our Time - Overview  Clinical staff conducted group or individual video education with verbal and written material and guidebook.  Patient learns that according to the CDC, 50%  to 70% of chronic diseases (such as obesity, type 2 diabetes, elevated lipids, hypertension, and heart disease) are avoidable through lifestyle improvements including healthier food choices, listening to satiety cues, and increased physical activity.  Sleep Disorders Clinical staff conducted group or individual video education with verbal and written material and guidebook.  Patient learns how good quality and duration of sleep are important to overall health and well-being. Patient also learns about sleep disorders and how they impact health along with recommendations to address them, including discussing with a physician.  Nutrition  Dining Out - Part 2 Clinical staff conducted group or individual video education with verbal and written material and guidebook.  Patient learns how to plan ahead and communicate in order to maximize their dining experience in a healthy and nutritious manner. Included are recommended food choices based on the type of restaurant the patient is visiting.   Fueling a Banker conducted group or individual video education with verbal and written material and guidebook.  There is a strong connection between our food choices and our health. Diseases like obesity and type 2 diabetes are very prevalent and are in large-part due to lifestyle choices. The Pritikin Eating Plan provides plenty of food and hunger-curbing satisfaction. It is easy to follow, affordable, and helps reduce health risks.  Menu Workshop  Clinical staff conducted group or individual video education with verbal and written material and guidebook.  Patient learns that restaurant meals can sabotage health goals because they are often packed with calories, fat, sodium, and sugar. Recommendations include strategies to plan ahead and to communicate with the manager, chef, or server to help order a healthier meal.  Planning Your Eating Strategy  Clinical staff conducted group or individual  video education with verbal and written material and guidebook.  Patient learns about the Pritikin Eating Plan and its benefit of reducing the risk of disease. The Pritikin Eating Plan does not focus on calories. Instead, it emphasizes high-quality, nutrient-rich foods. By knowing the characteristics of the foods, we choose, we can determine their calorie density and make informed decisions.  Targeting Your Nutrition Priorities  Clinical staff conducted group or individual video education with verbal and written material and guidebook.  Patient learns that lifestyle habits have a tremendous impact on disease risk and progression. This video provides eating and physical activity recommendations based on your personal health goals, such as reducing LDL cholesterol, losing weight, preventing or controlling type 2 diabetes, and reducing high blood pressure.  Vitamins and Minerals  Clinical staff conducted group or individual video education with verbal and written material and guidebook.  Patient  learns different ways to obtain key vitamins and minerals, including through a recommended healthy diet. It is important to discuss all supplements you take with your doctor.   Healthy Mind-Set    Smoking Cessation  Clinical staff conducted group or individual video education with verbal and written material and guidebook.  Patient learns that cigarette smoking and tobacco addiction pose a serious health risk which affects millions of people. Stopping smoking will significantly reduce the risk of heart disease, lung disease, and many forms of cancer. Recommended strategies for quitting are covered, including working with your doctor to develop a successful plan.  Culinary   Becoming a Set designer conducted group or individual video education with verbal and written material and guidebook.  Patient learns that cooking at home can be healthy, cost-effective, quick, and puts them in control.  Keys to cooking healthy recipes will include looking at your recipe, assessing your equipment needs, planning ahead, making it simple, choosing cost-effective seasonal ingredients, and limiting the use of added fats, salts, and sugars.  Cooking - Breakfast and Snacks  Clinical staff conducted group or individual video education with verbal and written material and guidebook.  Patient learns how important breakfast is to satiety and nutrition through the entire day. Recommendations include key foods to eat during breakfast to help stabilize blood sugar levels and to prevent overeating at meals later in the day. Planning ahead is also a key component.  Cooking - Educational psychologist conducted group or individual video education with verbal and written material and guidebook.  Patient learns eating strategies to improve overall health, including an approach to cook more at home. Recommendations include thinking of animal protein as a side on your plate rather than center stage and focusing instead on lower calorie dense options like vegetables, fruits, whole grains, and plant-based proteins, such as beans. Making sauces in large quantities to freeze for later and leaving the skin on your vegetables are also recommended to maximize your experience.  Cooking - Healthy Salads and Dressing Clinical staff conducted group or individual video education with verbal and written material and guidebook.  Patient learns that vegetables, fruits, whole grains, and legumes are the foundations of the Pritikin Eating Plan. Recommendations include how to incorporate each of these in flavorful and healthy salads, and how to create homemade salad dressings. Proper handling of ingredients is also covered. Cooking - Soups and [employer] - Soups and Desserts Clinical staff conducted group or individual video education with verbal and written material and guidebook.  Patient learns that Pritikin soups and  desserts make for easy, nutritious, and delicious snacks and meal components that are low in sodium, fat, sugar, and calorie density, while high in vitamins, minerals, and filling fiber. Recommendations include simple and healthy ideas for soups and desserts.   Overview     The Pritikin Solution Program Overview Clinical staff conducted group or individual video education with verbal and written material and guidebook.  Patient learns that the results of the Pritikin Program have been documented in more than 100 articles published in peer-reviewed journals, and the benefits include reducing risk factors for (and, in some cases, even reversing) high cholesterol, high blood pressure, type 2 diabetes, obesity, and more! An overview of the three key pillars of the Pritikin Program will be covered: eating well, doing regular exercise, and having a healthy mind-set.  WORKSHOPS  Exercise: Exercise Basics: Building Your Action Plan Clinical staff led group instruction and group  discussion with PowerPoint presentation and patient guidebook. To enhance the learning environment the use of posters, models and videos may be added. At the conclusion of this workshop, patients will comprehend the difference between physical activity and exercise, as well as the benefits of incorporating both, into their routine. Patients will understand the FITT (Frequency, Intensity, Time, and Type) principle and how to use it to build an exercise action plan. In addition, safety concerns and other considerations for exercise and cardiac rehab will be addressed by the presenter. The purpose of this lesson is to promote a comprehensive and effective weekly exercise routine in order to improve patients' overall level of fitness.   Managing Heart Disease: Your Path to a Healthier Heart Clinical staff led group instruction and group discussion with PowerPoint presentation and patient guidebook. To enhance the learning environment  the use of posters, models and videos may be added.At the conclusion of this workshop, patients will understand the anatomy and physiology of the heart. Additionally, they will understand how Pritikin's three pillars impact the risk factors, the progression, and the management of heart disease.  The purpose of this lesson is to provide a high-level overview of the heart, heart disease, and how the Pritikin lifestyle positively impacts risk factors.  Exercise Biomechanics Clinical staff led group instruction and group discussion with PowerPoint presentation and patient guidebook. To enhance the learning environment the use of posters, models and videos may be added. Patients will learn how the structural parts of their bodies function and how these functions impact their daily activities, movement, and exercise. Patients will learn how to promote a neutral spine, learn how to manage pain, and identify ways to improve their physical movement in order to promote healthy living. The purpose of this lesson is to expose patients to common physical limitations that impact physical activity. Participants will learn practical ways to adapt and manage aches and pains, and to minimize their effect on regular exercise. Patients will learn how to maintain good posture while sitting, walking, and lifting.  Balance Training and Fall Prevention  Clinical staff led group instruction and group discussion with PowerPoint presentation and patient guidebook. To enhance the learning environment the use of posters, models and videos may be added. At the conclusion of this workshop, patients will understand the importance of their sensorimotor skills (vision, proprioception, and the vestibular system) in maintaining their ability to balance as they age. Patients will apply a variety of balancing exercises that are appropriate for their current level of function. Patients will understand the common causes for  poor balance, possible solutions to these problems, and ways to modify their physical environment in order to minimize their fall risk. The purpose of this lesson is to teach patients about the importance of maintaining balance as they age and ways to minimize their risk of falling.  WORKSHOPS   Nutrition:  Fueling a Ship broker led group instruction and group discussion with PowerPoint presentation and patient guidebook. To enhance the learning environment the use of posters, models and videos may be added. Patients will review the foundational principles of the Pritikin Eating Plan and understand what constitutes a serving size in each of the food groups. Patients will also learn Pritikin-friendly foods that are better choices when away from home and review make-ahead meal and snack options. Calorie density will be reviewed and applied to three nutrition priorities: weight maintenance, weight loss, and weight gain. The purpose of this lesson is to reinforce (in a group setting) the  key concepts around what patients are recommended to eat and how to apply these guidelines when away from home by planning and selecting Pritikin-friendly options. Patients will understand how calorie density may be adjusted for different weight management goals.  Mindful Eating  Clinical staff led group instruction and group discussion with PowerPoint presentation and patient guidebook. To enhance the learning environment the use of posters, models and videos may be added. Patients will briefly review the concepts of the Pritikin Eating Plan and the importance of low-calorie dense foods. The concept of mindful eating will be introduced as well as the importance of paying attention to internal hunger signals. Triggers for non-hunger eating and techniques for dealing with triggers will be explored. The purpose of this lesson is to provide patients with the opportunity to review the basic principles of the  Pritikin Eating Plan, discuss the value of eating mindfully and how to measure internal cues of hunger and fullness using the Hunger Scale. Patients will also discuss reasons for non-hunger eating and learn strategies to use for controlling emotional eating.  Targeting Your Nutrition Priorities Clinical staff led group instruction and group discussion with PowerPoint presentation and patient guidebook. To enhance the learning environment the use of posters, models and videos may be added. Patients will learn how to determine their genetic susceptibility to disease by reviewing their family history. Patients will gain insight into the importance of diet as part of an overall healthy lifestyle in mitigating the impact of genetics and other environmental insults. The purpose of this lesson is to provide patients with the opportunity to assess their personal nutrition priorities by looking at their family history, their own health history and current risk factors. Patients will also be able to discuss ways of prioritizing and modifying the Pritikin Eating Plan for their highest risk areas  Menu  Clinical staff led group instruction and group discussion with PowerPoint presentation and patient guidebook. To enhance the learning environment the use of posters, models and videos may be added. Using menus brought in from [employer], or printed from [employer], patients will apply the Pritikin dining out guidelines that were presented in the [employer] video. Patients will also be able to practice these guidelines in a variety of provided scenarios. The purpose of this lesson is to provide patients with the opportunity to practice hands-on learning of the Pritikin Dining Out guidelines with actual menus and practice scenarios.  Label Reading Clinical staff led group instruction and group discussion with PowerPoint presentation and patient guidebook. To enhance the learning environment  the use of posters, models and videos may be added. Patients will review and discuss the Pritikin label reading guidelines presented in Pritikin's Label Reading Educational series video. Using fool labels brought in from local grocery stores and markets, patients will apply the label reading guidelines and determine if the packaged food meet the Pritikin guidelines. The purpose of this lesson is to provide patients with the opportunity to review, discuss, and practice hands-on learning of the Pritikin Label Reading guidelines with actual packaged food labels. Cooking School  Pritikin's [employer] are designed to teach patients ways to prepare quick, simple, and affordable recipes at home. The importance of nutrition's role in chronic disease risk reduction is reflected in its emphasis in the overall Pritikin program. By learning how to prepare essential core Pritikin Eating Plan recipes, patients will increase control over what they eat; be able to customize the flavor of foods without the use of added  salt, sugar, or fat; and improve the quality of the food they consume. By learning a set of core recipes which are easily assembled, quickly prepared, and affordable, patients are more likely to prepare more healthy foods at home. These workshops focus on convenient breakfasts, simple entres, side dishes, and desserts which can be prepared with minimal effort and are consistent with nutrition recommendations for cardiovascular risk reduction. Cooking [employer] are taught by a Armed forces logistics/support/administrative officer (RD) who has been trained by the [employer]. The chef or RD has a clear understanding of the importance of minimizing - if not completely eliminating - added fat, sugar, and sodium in recipes. Throughout the series of Cooking School Workshop sessions, patients will learn about healthy ingredients and efficient methods of cooking to build confidence in their capability to  prepare    Cooking School weekly topics:  Adding Flavor- Sodium-Free  Fast and Healthy Breakfasts  Powerhouse Plant-Based Proteins  Satisfying Salads and Dressings  Simple Sides and Sauces  International Cuisine-Spotlight on the [employer] Zones  Delicious Desserts  Savory Soups  [employer] - Meals in a Astronomer Appetizers and Snacks  Comforting Weekend Breakfasts  One-Pot Wonders   Fast Evening Meals  Landscape architect Your Pritikin Plate  WORKSHOPS   Healthy Mindset (Psychosocial):  Focused Goals, Sustainable Changes Clinical staff led group instruction and group discussion with PowerPoint presentation and patient guidebook. To enhance the learning environment the use of posters, models and videos may be added. Patients will be able to apply effective goal setting strategies to establish at least one personal goal, and then take consistent, meaningful action toward that goal. They will learn to identify common barriers to achieving personal goals and develop strategies to overcome them. Patients will also gain an understanding of how our mind-set can impact our ability to achieve goals and the importance of cultivating a positive and growth-oriented mind-set. The purpose of this lesson is to provide patients with a deeper understanding of how to set and achieve personal goals, as well as the tools and strategies needed to overcome common obstacles which may arise along the way.  From Head to Heart: The Power of a Healthy Outlook  Clinical staff led group instruction and group discussion with PowerPoint presentation and patient guidebook. To enhance the learning environment the use of posters, models and videos may be added. Patients will be able to recognize and describe the impact of emotions and mood on physical health. They will discover the importance of self-care and explore self-care practices which may work for them. Patients will also learn how to utilize  the 4 C's to cultivate a healthier outlook and better manage stress and challenges. The purpose of this lesson is to demonstrate to patients how a healthy outlook is an essential part of maintaining good health, especially as they continue their cardiac rehab journey.  Healthy Sleep for a Healthy Heart Clinical staff led group instruction and group discussion with PowerPoint presentation and patient guidebook. To enhance the learning environment the use of posters, models and videos may be added. At the conclusion of this workshop, patients will be able to demonstrate knowledge of the importance of sleep to overall health, well-being, and quality of life. They will understand the symptoms of, and treatments for, common sleep disorders. Patients will also be able to identify daytime and nighttime behaviors which impact sleep, and they will be able to apply these tools to help manage sleep-related challenges. The purpose  of this lesson is to provide patients with a general overview of sleep and outline the importance of quality sleep. Patients will learn about a few of the most common sleep disorders. Patients will also be introduced to the concept of "sleep hygiene," and discover ways to self-manage certain sleeping problems through simple daily behavior changes. Finally, the workshop will motivate patients by clarifying the links between quality sleep and their goals of heart-healthy living.   Recognizing and Reducing Stress Clinical staff led group instruction and group discussion with PowerPoint presentation and patient guidebook. To enhance the learning environment the use of posters, models and videos may be added. At the conclusion of this workshop, patients will be able to understand the types of stress reactions, differentiate between acute and chronic stress, and recognize the impact that chronic stress has on their health. They will also be able to apply different coping mechanisms, such as reframing  negative self-talk. Patients will have the opportunity to practice a variety of stress management techniques, such as deep abdominal breathing, progressive muscle relaxation, and/or guided imagery.  The purpose of this lesson is to educate patients on the role of stress in their lives and to provide healthy techniques for coping with it.  Learning Barriers/Preferences:  Learning Barriers/Preferences - 12/20/23 1622       Learning Barriers/Preferences   Learning Barriers Exercise Concerns   Patient has had 2 falls in the past year. But she did do well on her 30 second single leg stand. She achieved 30 seconds   Learning Preferences Written Material;Group Instruction;Computer/Internet;Individual Instruction             Education Topics:  Knowledge Questionnaire Score:  Knowledge Questionnaire Score - 12/20/23 1623       Knowledge Questionnaire Score   Pre Score 23/24             Core Components/Risk Factors/Patient Goals at Admission:  Personal Goals and Risk Factors at Admission - 12/20/23 1531       Core Components/Risk Factors/Patient Goals on Admission    Weight Management Yes    Intervention Weight Management: Develop a combined nutrition and exercise program designed to reach desired caloric intake, while maintaining appropriate intake of nutrient and fiber, sodium and fats, and appropriate energy expenditure required for the weight goal.;Weight Management: Provide education and appropriate resources to help participant work on and attain dietary goals.    Expected Outcomes Short Term: Continue to assess and modify interventions until short term weight is achieved;Long Term: Adherence to nutrition and physical activity/exercise program aimed toward attainment of established weight goal;Understanding recommendations for meals to include 15-35% energy as protein, 25-35% energy from fat, 35-60% energy from carbohydrates, less than 200mg  of dietary cholesterol, 20-35 gm of total  fiber daily;Understanding of distribution of calorie intake throughout the day with the consumption of 4-5 meals/snacks    Heart Failure Yes    Intervention Provide a combined exercise and nutrition program that is supplemented with education, support and counseling about heart failure. Directed toward relieving symptoms such as shortness of breath, decreased exercise tolerance, and extremity edema.    Expected Outcomes Improve functional capacity of life;Short term: Attendance in program 2-3 days a week with increased exercise capacity. Reported lower sodium intake. Reported increased fruit and vegetable intake. Reports medication compliance.;Short term: Daily weights obtained and reported for increase. Utilizing diuretic protocols set by physician.;Long term: Adoption of self-care skills and reduction of barriers for early signs and symptoms recognition and intervention leading to self-care maintenance.  Hypertension Yes    Intervention Provide education on lifestyle modifcations including regular physical activity/exercise, weight management, moderate sodium restriction and increased consumption of fresh fruit, vegetables, and low fat dairy, alcohol moderation, and smoking cessation.;Monitor prescription use compliance.    Expected Outcomes Long Term: Maintenance of blood pressure at goal levels.;Short Term: Continued assessment and intervention until BP is < 140/18mm HG in hypertensive participants. < 130/53mm HG in hypertensive participants with diabetes, heart failure or chronic kidney disease.    Lipids Yes    Intervention Provide education and support for participant on nutrition & aerobic/resistive exercise along with prescribed medications to achieve LDL 70mg , HDL >40mg .    Expected Outcomes Short Term: Participant states understanding of desired cholesterol values and is compliant with medications prescribed. Participant is following exercise prescription and nutrition guidelines.;Long Term:  Cholesterol controlled with medications as prescribed, with individualized exercise RX and with personalized nutrition plan. Value goals: LDL < 70mg , HDL > 40 mg.    Stress Yes    Intervention Offer individual and/or small group education and counseling on adjustment to heart disease, stress management and health-related lifestyle change. Teach and support self-help strategies.;Refer participants experiencing significant psychosocial distress to appropriate mental health specialists for further evaluation and treatment. When possible, include family members and significant others in education/counseling sessions.    Expected Outcomes Long Term: Emotional wellbeing is indicated by absence of clinically significant psychosocial distress or social isolation.;Short Term: Participant demonstrates changes in health-related behavior, relaxation and other stress management skills, ability to obtain effective social support, and compliance with psychotropic medications if prescribed.    Personal Goal Other Yes    Personal Goal Patient wants to know limits to exercise, increase strength, stamina. To feel safe getting a good workout    Intervention Will continue to monitor pt and progress workloads as tolerated without sign or symptom    Expected Outcomes Pt will achieve her goals and gain strength             Core Components/Risk Factors/Patient Goals Review:    Core Components/Risk Factors/Patient Goals at Discharge (Final Review):    ITP Comments:  ITP Comments     Row Name 12/20/23 1528           ITP Comments Dr. Gaylyn Keas medical director. Introduction to [facility] education/intensive cardiac rehab. Initial orientation packet reviewed with patient.                Comments: Participant attended orientation for the cardiac rehabilitation program on  12/20/2023  to perform initial intake and exercise walk test. Patient introduced to the [facility] education and orientation packet  was reviewed. Completed 6-minute walk test, measurements, initial ITP, and exercise prescription. Vital signs stable. Patient states feeling "swimmy headed" post walk test. Denying SOB, CP, visual changes or lightheadedness. Resolved within 30 seconds. Orthostatics ok with seated 98/63 and standing 93/63. Telemetry-normal sinus rhythm, PVC's asymptomatic. Felt better with hydration.   Service time was from 1315 to 1530.

## 2023-12-28 ENCOUNTER — Encounter (HOSPITAL_COMMUNITY)
Admission: RE | Admit: 2023-12-28 | Discharge: 2023-12-28 | Disposition: A | Discharge status: Home / Self Care | Source: Ambulatory Visit | Attending: Internal Medicine | Admitting: Internal Medicine

## 2023-12-28 DIAGNOSIS — D473 Essential (hemorrhagic) thrombocythemia: Secondary | ICD-10-CM | POA: Diagnosis not present

## 2023-12-28 DIAGNOSIS — Z9889 Other specified postprocedural states: Secondary | ICD-10-CM | POA: Insufficient documentation

## 2023-12-28 DIAGNOSIS — Z95818 Presence of other cardiac implants and grafts: Secondary | ICD-10-CM | POA: Insufficient documentation

## 2023-12-28 NOTE — Progress Notes (Signed)
 Daily Session Note  Patient Details  Name: Kristina Osborne MRN: [id number] Date of Birth: September 05, 1950 Referring Provider:   Flowsheet Row INTENSIVE CARDIAC REHAB ORIENT from 12/20/2023 in [facility]  Referring Provider Dinah Franco, MD       Encounter Date: 12/28/2023  Check In:  Session Check In - 12/28/23 1043       Check-In   Supervising physician immediately available to respond to emergencies [facility] MD immediately available    Physician(s) Koren Persons, NP    Location [facility]    Staff Present Pablo Boards BS, ACSM-CEP, Exercise Physiologist;Bailey Martina Sledge, MS, Exercise Physiologist;Johnny Alexia Angelucci, MS, Exercise Physiologist;David Makemson, MS, ACSM-CEP, CCRP, Exercise Physiologist;Maria Whitaker, RN, Lysbeth Sauger, MS, ACSM-CEP, Exercise Physiologist    Virtual Visit No    Medication changes reported     No    Fall or balance concerns reported    No    Tobacco Cessation No Change    Warm-up and Cool-down Not performed (comment)   CRP2 orientation   Resistance Training Performed Yes    VAD Patient? No    PAD/SET Patient? No      Pain Assessment   Currently in Pain? No/denies    Pain Score 0-No pain    Multiple Pain Sites No             Capillary Blood Glucose: No results found for this or any previous visit (from the past 24 hours).   Exercise Prescription Changes - 12/28/23 1029       Response to Exercise   Blood Pressure (Admit) 94/62    Blood Pressure (Exercise) 110/60    Blood Pressure (Exit) 90/60    Heart Rate (Admit) 74 bpm    Heart Rate (Exercise) 102 bpm    Heart Rate (Exit) 76 bpm    Symptoms None    Comments Off to a great start with exercise.    Duration Continue with 30 min of aerobic exercise without signs/symptoms of physical distress.    Intensity THRR unchanged      Progression   Progression Continue to progress workloads to maintain intensity without  signs/symptoms of physical distress.    Average METs 4.1      Resistance Training   Training Prescription Yes    Weight 2 lbs    Reps 10-15    Time 10 Minutes      Interval Training   Interval Training No      NuStep   Level 1    Minutes 15      Recumbant Elliptical   Level 1    RPM 38    Watts 74    Minutes 15    METs 4.1             Social History   Tobacco Use  Smoking Status Former   Current packs/day: 0.00   Types: Cigarettes   Quit date: 2002   Years since quitting: 23.3  Smokeless Tobacco Never  Tobacco Comments   Started smoking in her early 20's.   Smoked 0.5PP at her heaviest.     Goals Met:  Exercise tolerated well No report of concerns or symptoms today Strength training completed today  Goals Unmet:  Not Applicable  Comments: [name] started cardiac rehab today, and tolerated exercise well without symptoms. Telemetry was normal sinus rhythm, vital signs within normal limits.   Dr. Gaylyn Keas is Medical Director for Cardiac Rehab at [facility].

## 2023-12-31 ENCOUNTER — Encounter (HOSPITAL_COMMUNITY)
Admission: RE | Admit: 2023-12-31 | Discharge: 2023-12-31 | Disposition: A | Discharge status: Home / Self Care | Source: Ambulatory Visit | Attending: Internal Medicine | Admitting: Internal Medicine

## 2023-12-31 ENCOUNTER — Telehealth (HOSPITAL_COMMUNITY): Payer: Self-pay | Admitting: *Deleted

## 2023-12-31 DIAGNOSIS — Z9889 Other specified postprocedural states: Secondary | ICD-10-CM

## 2023-12-31 DIAGNOSIS — Z95818 Presence of other cardiac implants and grafts: Secondary | ICD-10-CM | POA: Diagnosis not present

## 2023-12-31 NOTE — Telephone Encounter (Signed)
-----   Message from Abagail Hoar sent at 12/31/2023  9:09 AM EDT ----- Thank  you Aleyda Gindlesperger! ----- Message ----- From: Cyndee Dragon, RN Sent: 12/31/2023   8:53 AM EDT To: Ardia Kraft, PA-C  Good morning Ollie Bhat "Trish" started cardiac rehab on Friday. Mrs Iseminger's resting systolic BP's were in the 90's. Mrs Sharry Deem is not on any BP meds but did admit to not drinking a lot of water. Mrs Sharry Deem did well with exercise. I wanted to bring it to your attention as she has an upcoming appointment with you. I did encourage her to hydrate before coming to exercise.  Thank you! Sincerely, [facility]  Cardiac Rehab

## 2024-01-02 ENCOUNTER — Inpatient Hospital Stay

## 2024-01-02 ENCOUNTER — Encounter (HOSPITAL_COMMUNITY)
Admission: RE | Admit: 2024-01-02 | Discharge: 2024-01-02 | Disposition: A | Discharge status: Home / Self Care | Source: Ambulatory Visit | Attending: Internal Medicine

## 2024-01-02 DIAGNOSIS — Z95818 Presence of other cardiac implants and grafts: Secondary | ICD-10-CM | POA: Diagnosis not present

## 2024-01-02 DIAGNOSIS — Z9889 Other specified postprocedural states: Secondary | ICD-10-CM

## 2024-01-03 ENCOUNTER — Telehealth: Payer: Self-pay | Admitting: Hematology and Oncology

## 2024-01-03 ENCOUNTER — Inpatient Hospital Stay: Attending: Hematology and Oncology

## 2024-01-03 NOTE — Progress Notes (Signed)
 QUALITY OF LIFE SCORE REVIEW  Pt completed Quality of Life survey as a participant in Cardiac Rehab.  Scores 19.0 or below are considered low.  Pt score very low in several areas Overall 19.42, Health and Function 18.03, socioeconomic 25.25, physiological and spiritual 16.50, family 21.00. Patient quality of life slightly altered by physical constraints which limits ability to perform as prior to recent cardiac illness Kristina Osborne denies being depressed currently. 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 Kristina Osborne says she is now feeling better and has a little more energy since starting exercise at cardiac rehab.Kristina Osborne said she had home health and found it to be intrusive. Trish's husband has an appointment to be evaluated for neuro rehab. Kristina Osborne says she sometimes has difficulty staying asleep. Discussed sleep hygiene methods.  Offered emotional support and reassurance.  Will continue to monitor and intervene as necessary.  Monte Antonio RN BSN

## 2024-01-04 ENCOUNTER — Inpatient Hospital Stay

## 2024-01-04 ENCOUNTER — Encounter (HOSPITAL_COMMUNITY)
Admission: RE | Admit: 2024-01-04 | Discharge: 2024-01-04 | Disposition: A | Discharge status: Home / Self Care | Source: Ambulatory Visit | Attending: Internal Medicine | Admitting: Internal Medicine

## 2024-01-04 DIAGNOSIS — D500 Iron deficiency anemia secondary to blood loss (chronic): Secondary | ICD-10-CM | POA: Insufficient documentation

## 2024-01-04 DIAGNOSIS — Z95818 Presence of other cardiac implants and grafts: Secondary | ICD-10-CM | POA: Diagnosis not present

## 2024-01-04 DIAGNOSIS — D473 Essential (hemorrhagic) thrombocythemia: Secondary | ICD-10-CM | POA: Insufficient documentation

## 2024-01-04 DIAGNOSIS — Z79899 Other long term (current) drug therapy: Secondary | ICD-10-CM | POA: Insufficient documentation

## 2024-01-04 DIAGNOSIS — K5520 Angiodysplasia of colon without hemorrhage: Secondary | ICD-10-CM | POA: Insufficient documentation

## 2024-01-04 DIAGNOSIS — Z9889 Other specified postprocedural states: Secondary | ICD-10-CM

## 2024-01-04 LAB — CBC WITH DIFFERENTIAL (CANCER CENTER ONLY)
Abs Immature Granulocytes: 0 10*3/uL (ref 0.00–0.07)
Basophils Absolute: 0 10*3/uL (ref 0.0–0.1)
Basophils Relative: 1 %
Eosinophils Absolute: 0 10*3/uL (ref 0.0–0.5)
Eosinophils Relative: 1 %
HCT: 29.4 % — ABNORMAL LOW (ref 36.0–46.0)
Hemoglobin: 10.5 g/dL — ABNORMAL LOW (ref 12.0–15.0)
Immature Granulocytes: 0 %
Lymphocytes Relative: 26 %
Lymphs Abs: 0.4 10*3/uL — ABNORMAL LOW (ref 0.7–4.0)
MCH: 44.3 pg — ABNORMAL HIGH (ref 26.0–34.0)
MCHC: 35.7 g/dL (ref 30.0–36.0)
MCV: 124.1 fL — ABNORMAL HIGH (ref 80.0–100.0)
Monocytes Absolute: 0.1 10*3/uL (ref 0.1–1.0)
Monocytes Relative: 7 %
Neutro Abs: 1.1 10*3/uL — ABNORMAL LOW (ref 1.7–7.7)
Neutrophils Relative %: 65 %
Platelet Count: 314 10*3/uL (ref 150–400)
RBC: 2.37 MIL/uL — ABNORMAL LOW (ref 3.87–5.11)
RDW: 14.5 % (ref 11.5–15.5)
WBC Count: 1.6 10*3/uL — ABNORMAL LOW (ref 4.0–10.5)
nRBC: 0 % (ref 0.0–0.2)

## 2024-01-04 LAB — CMP (CANCER CENTER ONLY)
ALT: 13 U/L (ref 0–44)
AST: 20 U/L (ref 15–41)
Albumin: 4.3 g/dL (ref 3.5–5.0)
Alkaline Phosphatase: 57 U/L (ref 38–126)
Anion gap: 3 — ABNORMAL LOW (ref 5–15)
BUN: 17 mg/dL (ref 8–23)
CO2: 29 mmol/L (ref 22–32)
Calcium: 9.5 mg/dL (ref 8.9–10.3)
Chloride: 106 mmol/L (ref 98–111)
Creatinine: 0.71 mg/dL (ref 0.44–1.00)
GFR, Estimated: >60 mL/min (ref >60)
Glucose, Bld: 98 mg/dL (ref 70–99)
Potassium: 4 mmol/L (ref 3.5–5.1)
Sodium: 138 mmol/L (ref 135–145)
Total Bilirubin: 0.6 mg/dL (ref 0.0–1.2)
Total Protein: 6.6 g/dL (ref 6.5–8.1)

## 2024-01-04 LAB — FERRITIN: Ferritin: 243 ng/mL (ref 11–307)

## 2024-01-07 ENCOUNTER — Telehealth: Payer: Self-pay | Admitting: *Deleted

## 2024-01-07 ENCOUNTER — Other Ambulatory Visit: Payer: Self-pay | Admitting: *Deleted

## 2024-01-07 ENCOUNTER — Encounter (HOSPITAL_COMMUNITY)
Admission: RE | Admit: 2024-01-07 | Discharge: 2024-01-07 | Disposition: A | Discharge status: Home / Self Care | Source: Ambulatory Visit | Attending: Internal Medicine

## 2024-01-07 DIAGNOSIS — Z9889 Other specified postprocedural states: Secondary | ICD-10-CM

## 2024-01-07 DIAGNOSIS — Z95818 Presence of other cardiac implants and grafts: Secondary | ICD-10-CM | POA: Diagnosis not present

## 2024-01-07 DIAGNOSIS — D473 Essential (hemorrhagic) thrombocythemia: Secondary | ICD-10-CM

## 2024-01-07 NOTE — Telephone Encounter (Signed)
 Received call from pt. She states she feels weak and shaky. She had labs done on Friday. She is wondering if her Ferritin is low. Reviewed labs. Advised Ferritin is good at 243. Advised that her HGB dropped from 12.6 to 10.5 in 2 weeks.  Noted WBC is down as well to 1.6, ANC is 1.1. platelets are good @ 314.  She is taking Hydroxyurea  2 tablets twice a day.  Advised that I would discuss this will Wyline Hearing, PA-C and call her back.  She voiced understanding.  She does see her cardiologist on Wednesday. She is currently at cardiac rehab.

## 2024-01-07 NOTE — Telephone Encounter (Signed)
 TCT patient after discussing her recent call/lab results with Wyline Hearing, PA-C. Spoke with her. Advised to hold her Hydrea  until Friday, 01/11/24. Advised we will recheck her labs on Friday and then she will resume her Hydrea  with  the following Schedule : 1000mg  BID MWF 1000mg   in am and then 500mg  in PM for Tuesday, Thursday and Sat/Sun. Advised she can start this with Friday evening dose. Advised with will then see her as scheduled on 01/30/24 for labs and Dr. Rosaline Coma clinic visit. Pt voiced understanding. Lab scheduled for 01/11/24.

## 2024-01-09 ENCOUNTER — Encounter (HOSPITAL_COMMUNITY)
Admission: RE | Admit: 2024-01-09 | Discharge: 2024-01-09 | Disposition: A | Discharge status: Home / Self Care | Source: Ambulatory Visit | Attending: Internal Medicine

## 2024-01-09 DIAGNOSIS — Z95818 Presence of other cardiac implants and grafts: Secondary | ICD-10-CM | POA: Diagnosis not present

## 2024-01-09 DIAGNOSIS — Z9889 Other specified postprocedural states: Secondary | ICD-10-CM

## 2024-01-09 NOTE — Progress Notes (Unsigned)
 HEART AND VASCULAR CENTER   MULTIDISCIPLINARY HEART VALVE CLINIC                                     Cardiology Office Note:    Date:  01/11/2024   ID:  Kristina Osborne, DOB 03-Mar-1951, MRN [id number]  PCP:  Roslyn Coombe, MD  [facility] Cardiologist:  Hazle Lites, MD  [facility] Structural heart: Kyra Phy, MD [facility] Electrophysiologist:  None   Referring MD: Roslyn Coombe, MD   1 month s/p mTEER  History of Present Illness:    Kristina Osborne is a 73 y.o. 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   She has a history of thrombocytosis, diagnosed after an episode of feeling ill with tachycardia and nausea, leading to an ER visit where she was found to have a platelet count of 1.5 million and severe anemia. She was diagnosed with JAK2 mutation and started on hydroxyurea . Initially treated with aspirin due to stroke risk concerns, she developed gastrointestinal bleeding, leading to iron  infusions over a year and a half. Her last GI bleed was last year, and she has been advised against taking anticoagulants. She continues to receive iron  infusions as needed due to anemia from hydroxyurea  use.   Echocardiogram 10/11/23 showed preserved EF 60-65% with marked abnormality of the mitral valve with moderate calcification and deformity of the posterior leaflet with moderate prolapse and very severely directed MR with filling of the entire LA. TEE 10/24/23 confirmed severe flail of the posterior leaflet and degenerative MV with severe MR. She was admitted 3/4-11/02/23 with acute on chronic diastolic heart failure exacerbation. [facility] 11/01/23 showed normal coronary arteries and giant V waves to around 48 mmHg with a mean wedge  pressure of 27 mmHg. Following discharge she met with Dr. Honey Lusty and due to severe mitral annular calcification, he did not think that she would be best served by a surgical approach and referred her to Dr. Lorie Rook for consideration of mTEER. There were plans for a repeat TEE, but this was later cancelled as the necessary information was extracted from the previous study (mean mitral valve gradient of 2.5 mmHg at 85 bpm and clear P2 flail). S/p percutaneous mitral valve repair with MitraClip XTW + NTW at A2/P2 on 11/28/23 by Dr. Lorie Rook. Post op echo showed EF 65-70%, small iASD, and 2 mitra-clips noted in the A2/P2 position with mild residual MR with a mean gradient of 6 mm hg. She was discharged on Plavix  monotherapy given history of GI bleeding. Had post op issues with diarrhea and fatigue   Today the patient presents to clinic for follow up. Here with her husband. She is doing great. No CP or SOB. No LE edema, orthopnea or PND. No dizziness or syncope. Occasional palpitations that she attributes to PVCs. Mostly deals with fatigue. Possibly had some blood in her stool recently and reached out to hematologist who held hydr oxyuria and getting repeat labs.      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 Lorie Rook   Thrombocytosis      Current Medications: Current Meds  Medication Sig   acetaminophen  (TYLENOL ) 500 MG tablet Take 1,000 mg by mouth every 6 (six) hours as needed for headache (pain.).   amoxicillin  (AMOXIL ) 500 MG tablet Take 4 tablets (2,000 mg total) by mouth as directed.  1 hour prior to dental work including cleanings   cholestyramine  light (PREVALITE ) 4 g packet Take 2 g by mouth 2 (two) times daily.   clopidogrel  (PLAVIX ) 75 MG tablet Take 1 tablet (75 mg total) by mouth daily with breakfast.   famotidine  (PEPCID ) 40 MG tablet TAKE 1 TABLET(40 MG) BY MOUTH DAILY   folic acid  (FOLVITE ) 1 MG tablet TAKE 1 TABLET(1 MG) BY MOUTH DAILY   furosemide  (LASIX ) 40 MG tablet Take 1 tablet (40 mg total) by mouth as needed for fluid or edema.   Glucosamine-Chondroitin (COSAMIN DS PO) Take 1 tablet by mouth daily.   hydroxyurea  (HYDREA ) 500 MG capsule TAKE 1 CAPSULE(500 MG) BY MOUTH TWICE DAILY   Lycopene 10 MG CAPS Take 10 mg by mouth daily.   sucralfate  (CARAFATE ) 1 g tablet TAKE 1 TABLET(1 GRAM) BY MOUTH FOUR TIMES DAILY AT BEDTIME WITH MEALS (Patient taking differently: Take 1 g by mouth at bedtime.)      ROS:   Please see the history of present illness.    All other systems reviewed and are negative.  EKGs       Risk Assessment/Calculations:           Physical Exam:    VS:  BP 101/64 (BP Location: Left Arm, Patient Position: Sitting, Cuff Size: Normal)   Pulse 79   Ht 5\' 6"  (1.676 m)   Wt 144 lb (65.3 kg)   LMP  (LMP Unknown)   SpO2 99%   BMI 23.24 kg/m     Wt Readings from Last 3 Encounters:  01/10/24 144 lb (65.3 kg)  12/20/23 142 lb 6.7 oz (64.6 kg)  12/05/23 137 lb 1.6 oz (62.2 kg)     GEN: Well nourished, well developed in no acute distress NECK: No JVD CARDIAC: RRR, 1/6 holosystolic murmur @ apex. No rubs, gallops RESPIRATORY:  Clear to auscultation without rales, wheezing or rhonchi  ABDOMEN: Soft, non-tender, non-distended EXTREMITIES:  No edema; No deformity.    ASSESSMENT:    1. S/P mitral valve clip implantation   2. Chronic heart failure with preserved ejection fraction (HCC)   3. Anemia, unspecified type   4. Thrombocytosis   5. PVC's (premature ventricular contractions)      PLAN:    In order of problems listed  above:  Severe mitral regurgitation s/p mTEER:  -- Echo today shows EF 60%, s/p mTEER with 2 clips A2/P2 with mild-mod residual MR with a mean gradient of 4 mmHg. -- NYHA class I symptoms. She reports clip has made a major difference in her life.  -- SBE prophylaxis discussed; she has amoxicillin .  -- Continue Plavix  monotherapy given issues with GI bleeding and anemia in the past. Will discuss long term antiplatelet therapy with Dr. Lorie Rook. -- I will see back for 1 year echo and OV.  Chronic HFpEF: -- Appears euvolemic.  -- Continue PRN Lasix .  Anemia: -- Followed closely by Dr. Rosaline Coma  Thrombocytosis: -- Continue hydroxyurea  500 mg twice daily (held temporarily).  -- Followed by Dr. Rosaline Coma.  PVCs: -- Notes "annoying heart beats"  -- We discussed options of low dose BB but already has ongoing issues with fatigue and low baseline BPs so we opted for conservative management. -- She felt better with reassurance that these are benign.  Medication Adjustments/Labs and Tests Ordered: Current medicines are reviewed at length with the patient today.  Concerns regarding medicines are outlined above.  No orders of the defined types were placed in this encounter.  No orders of the defined types were placed in this encounter.   Patient Instructions  Medication Instructions:  Your physician recommends that you continue on your current medications as directed. Please refer to the Current Medication list given to you today.  *If you need a refill on your cardiac medications before your next appointment, please call your pharmacy*  Lab Work: NONE If you have labs (blood work) drawn today and your tests are completely normal, you will receive your results only by: MyChart Message (if you have MyChart) OR A paper copy in the mail If you have any lab test that is abnormal or we need to change your treatment, we will call you to review the  results.  Testing/Procedures: NONE  Follow-Up: At [facility], you and your health needs are our priority.  As part of our continuing mission to provide you with exceptional heart care, our providers are all part of one team.  This team includes your primary Cardiologist (physician) and Advanced Practice Providers or APPs (Physician Assistants and Nurse Practitioners) who all work together to provide you with the care you need, when you need it.  Your next appointment:   KEEP SCHEDULED FOLLOW-UP       Signed, Abagail Hoar, PA-C  01/11/2024 8:45 AM    [facility]

## 2024-01-10 ENCOUNTER — Ambulatory Visit (INDEPENDENT_AMBULATORY_CARE_PROVIDER_SITE_OTHER): Admitting: Physician Assistant

## 2024-01-10 ENCOUNTER — Ambulatory Visit (HOSPITAL_COMMUNITY)
Admission: RE | Admit: 2024-01-10 | Discharge: 2024-01-10 | Disposition: A | Discharge status: Home / Self Care | Source: Ambulatory Visit | Attending: Cardiovascular Disease | Admitting: Cardiovascular Disease

## 2024-01-10 VITALS — BP 101/64 | HR 79 | Ht 66.0 in | Wt 144.0 lb

## 2024-01-10 DIAGNOSIS — Z9889 Other specified postprocedural states: Secondary | ICD-10-CM

## 2024-01-10 DIAGNOSIS — I493 Ventricular premature depolarization: Secondary | ICD-10-CM | POA: Diagnosis not present

## 2024-01-10 DIAGNOSIS — Z95818 Presence of other cardiac implants and grafts: Secondary | ICD-10-CM

## 2024-01-10 DIAGNOSIS — D75839 Thrombocytosis, unspecified: Secondary | ICD-10-CM | POA: Insufficient documentation

## 2024-01-10 DIAGNOSIS — R197 Diarrhea, unspecified: Secondary | ICD-10-CM

## 2024-01-10 DIAGNOSIS — I340 Nonrheumatic mitral (valve) insufficiency: Secondary | ICD-10-CM | POA: Insufficient documentation

## 2024-01-10 DIAGNOSIS — D649 Anemia, unspecified: Secondary | ICD-10-CM | POA: Diagnosis not present

## 2024-01-10 DIAGNOSIS — I5032 Chronic diastolic (congestive) heart failure: Secondary | ICD-10-CM | POA: Insufficient documentation

## 2024-01-10 LAB — ECHOCARDIOGRAM COMPLETE
Area-P 1/2: 3.84 cm2
MV M vel: 4.35 m/s
MV Peak grad: 75.7 mmHg
MV VTI: 1.8 cm2
S' Lateral: 3.1 cm

## 2024-01-10 NOTE — Patient Instructions (Signed)
 Medication Instructions:  Your physician recommends that you continue on your current medications as directed. Please refer to the Current Medication list given to you today.  *If you need a refill on your cardiac medications before your next appointment, please call your pharmacy*  Lab Work: NONE If you have labs (blood work) drawn today and your tests are completely normal, you will receive your results only by: MyChart Message (if you have MyChart) OR A paper copy in the mail If you have any lab test that is abnormal or we need to change your treatment, we will call you to review the results.  Testing/Procedures: NONE  Follow-Up: At [facility], you and your health needs are our priority.  As part of our continuing mission to provide you with exceptional heart care, our providers are all part of one team.  This team includes your primary Cardiologist (physician) and Advanced Practice Providers or APPs (Physician Assistants and Nurse Practitioners) who all work together to provide you with the care you need, when you need it.  Your next appointment:   KEEP SCHEDULED FOLLOW-UP

## 2024-01-11 ENCOUNTER — Inpatient Hospital Stay

## 2024-01-11 ENCOUNTER — Telehealth (HOSPITAL_COMMUNITY): Payer: Self-pay

## 2024-01-11 ENCOUNTER — Encounter: Payer: Self-pay | Admitting: Physician Assistant

## 2024-01-11 ENCOUNTER — Encounter (HOSPITAL_COMMUNITY): Admission: RE | Admit: 2024-01-11 | Source: Ambulatory Visit

## 2024-01-11 ENCOUNTER — Telehealth: Payer: Self-pay

## 2024-01-11 ENCOUNTER — Ambulatory Visit: Payer: Self-pay | Admitting: Physician Assistant

## 2024-01-11 DIAGNOSIS — D473 Essential (hemorrhagic) thrombocythemia: Secondary | ICD-10-CM

## 2024-01-11 DIAGNOSIS — Z95818 Presence of other cardiac implants and grafts: Secondary | ICD-10-CM | POA: Diagnosis not present

## 2024-01-11 LAB — CBC WITH DIFFERENTIAL (CANCER CENTER ONLY)
Abs Immature Granulocytes: 0.01 10*3/uL (ref 0.00–0.07)
Basophils Absolute: 0 10*3/uL (ref 0.0–0.1)
Basophils Relative: 0 %
Eosinophils Absolute: 0 10*3/uL (ref 0.0–0.5)
Eosinophils Relative: 0 %
HCT: 32.2 % — ABNORMAL LOW (ref 36.0–46.0)
Hemoglobin: 11.4 g/dL — ABNORMAL LOW (ref 12.0–15.0)
Immature Granulocytes: 1 %
Lymphocytes Relative: 24 %
Lymphs Abs: 0.4 10*3/uL — ABNORMAL LOW (ref 0.7–4.0)
MCH: 45.1 pg — ABNORMAL HIGH (ref 26.0–34.0)
MCHC: 35.4 g/dL (ref 30.0–36.0)
MCV: 127.3 fL — ABNORMAL HIGH (ref 80.0–100.0)
Monocytes Absolute: 0.4 10*3/uL (ref 0.1–1.0)
Monocytes Relative: 23 %
Neutro Abs: 0.9 10*3/uL — ABNORMAL LOW (ref 1.7–7.7)
Neutrophils Relative %: 52 %
Platelet Count: 430 10*3/uL — ABNORMAL HIGH (ref 150–400)
RBC: 2.53 MIL/uL — ABNORMAL LOW (ref 3.87–5.11)
RDW: 15.6 % — ABNORMAL HIGH (ref 11.5–15.5)
WBC Count: 1.8 10*3/uL — ABNORMAL LOW (ref 4.0–10.5)
nRBC: 0 % (ref 0.0–0.2)

## 2024-01-11 LAB — FERRITIN: Ferritin: 229 ng/mL (ref 11–307)

## 2024-01-11 LAB — CMP (CANCER CENTER ONLY)
ALT: 14 U/L (ref 0–44)
AST: 21 U/L (ref 15–41)
Albumin: 4.5 g/dL (ref 3.5–5.0)
Alkaline Phosphatase: 57 U/L (ref 38–126)
Anion gap: 4 — ABNORMAL LOW (ref 5–15)
BUN: 12 mg/dL (ref 8–23)
CO2: 30 mmol/L (ref 22–32)
Calcium: 9.9 mg/dL (ref 8.9–10.3)
Chloride: 105 mmol/L (ref 98–111)
Creatinine: 0.77 mg/dL (ref 0.44–1.00)
GFR, Estimated: >60 mL/min (ref >60)
Glucose, Bld: 90 mg/dL (ref 70–99)
Potassium: 4.5 mmol/L (ref 3.5–5.1)
Sodium: 139 mmol/L (ref 135–145)
Total Bilirubin: 0.4 mg/dL (ref 0.0–1.2)
Total Protein: 7.1 g/dL (ref 6.5–8.1)

## 2024-01-11 NOTE — Telephone Encounter (Signed)
 Patient called in stating she is feeling a lot better since she changed how she was taking the Hydrea  and she had her labs done today. He Hgb 11.4 and Plt 430. Patient stated she was gonna take the med how instructed Friday Saturday and Sunday but needs to know how to take it starting Monday. Please call patient back to let her know.

## 2024-01-11 NOTE — Telephone Encounter (Signed)
 Patient c/o for 10:15am class, no reason given. Will be back Monday.

## 2024-01-14 ENCOUNTER — Encounter (HOSPITAL_COMMUNITY)
Admission: RE | Admit: 2024-01-14 | Discharge: 2024-01-14 | Disposition: A | Discharge status: Home / Self Care | Source: Ambulatory Visit | Attending: Internal Medicine | Admitting: Internal Medicine

## 2024-01-14 ENCOUNTER — Telehealth: Payer: Self-pay | Admitting: *Deleted

## 2024-01-14 DIAGNOSIS — Z95818 Presence of other cardiac implants and grafts: Secondary | ICD-10-CM | POA: Diagnosis not present

## 2024-01-14 DIAGNOSIS — Z9889 Other specified postprocedural states: Secondary | ICD-10-CM

## 2024-01-14 NOTE — Telephone Encounter (Signed)
 TCT patient regarding recent lab results.  Spoke with her. Advised that Wyline Hearing, PA-C reviewed your labs from 01/11/24. WBC and hemoglobin have improved. I recommend continuing with the following Hydrea  dosing:   1000mg  BID on Monday/Wednesday/Friday and  1000mg   in am and then 500mg  in PM for Tuesday, Thursday and Sat/Sun.   Pt voiced understanding. She is pleased with her results. She is aware of her clinic appt. on 01/30/2024.

## 2024-01-16 ENCOUNTER — Inpatient Hospital Stay

## 2024-01-16 ENCOUNTER — Encounter (HOSPITAL_COMMUNITY)
Admission: RE | Admit: 2024-01-16 | Discharge: 2024-01-16 | Disposition: A | Discharge status: Home / Self Care | Source: Ambulatory Visit | Attending: Internal Medicine | Admitting: Internal Medicine

## 2024-01-16 DIAGNOSIS — Z9889 Other specified postprocedural states: Secondary | ICD-10-CM

## 2024-01-16 DIAGNOSIS — Z95818 Presence of other cardiac implants and grafts: Secondary | ICD-10-CM | POA: Diagnosis not present

## 2024-01-16 NOTE — Progress Notes (Signed)
 Cardiac Individual Treatment Plan  Patient Details  Name: Kristina Osborne MRN: [id number] Date of Birth: 30-Dec-1950 Referring Provider:   Flowsheet Row INTENSIVE CARDIAC REHAB ORIENT from 12/20/2023 in [facility]  Referring Provider Dinah Franco, MD       Initial Encounter Date:  Flowsheet Row INTENSIVE CARDIAC REHAB ORIENT from 12/20/2023 in [facility]  Date 12/20/23       Visit Diagnosis: 11/28/23 mitraclip  Patient's Home Medications on Admission:  Current Outpatient Medications:    acetaminophen  (TYLENOL ) 500 MG tablet, Take 1,000 mg by mouth every 6 (six) hours as needed for headache (pain.)., Disp: , Rfl:    amoxicillin  (AMOXIL ) 500 MG tablet, Take 4 tablets (2,000 mg total) by mouth as directed. 1 hour prior to dental work including cleanings, Disp: 12 tablet, Rfl: 12   cholestyramine  light (PREVALITE ) 4 g packet, Take 2 g by mouth 2 (two) times daily., Disp: , Rfl:    clopidogrel  (PLAVIX ) 75 MG tablet, Take 1 tablet (75 mg total) by mouth daily with breakfast., Disp: 90 tablet, Rfl: 1   famotidine  (PEPCID ) 40 MG tablet, TAKE 1 TABLET(40 MG) BY MOUTH DAILY, Disp: 90 tablet, Rfl: 0   folic acid  (FOLVITE ) 1 MG tablet, TAKE 1 TABLET(1 MG) BY MOUTH DAILY, Disp: 90 tablet, Rfl: 1   furosemide  (LASIX ) 40 MG tablet, Take 1 tablet (40 mg total) by mouth as needed for fluid or edema., Disp: 90 tablet, Rfl: 3   Glucosamine-Chondroitin (COSAMIN DS PO), Take 1 tablet by mouth daily., Disp: , Rfl:    hydroxyurea  (HYDREA ) 500 MG capsule, TAKE 1 CAPSULE(500 MG) BY MOUTH TWICE DAILY, Disp: 180 capsule, Rfl: 1   Lycopene 10 MG CAPS, Take 10 mg by mouth daily., Disp: , Rfl:    sucralfate  (CARAFATE ) 1 g tablet, TAKE 1 TABLET(1 GRAM) BY MOUTH FOUR TIMES DAILY AT BEDTIME WITH MEALS (Patient taking differently: Take 1 g by mouth at bedtime.), Disp: 360 tablet, Rfl: 1  Past  Medical History: Past Medical History:  Diagnosis Date   ABLA (acute blood loss anemia) 04/16/2022   Congenital malformation of intestinal fixation    Gastritis    Gastroesophageal reflux disease without esophagitis 09/10/2021   IDA (iron  deficiency anemia)    Mitral valve prolapse    Persistent hyperplasia of thymus (HCC)    S/P Thymectomy   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 Lorie Rook   Thrombocytosis     Tobacco Use: Social History   Tobacco Use  Smoking Status Former   Current packs/day: 0.00   Types: Cigarettes   Quit date: 2002   Years since quitting: 23.4  Smokeless Tobacco Never  Tobacco Comments   Started smoking in her early 20's.   Smoked 0.5PP at her heaviest.     Labs: Review Flowsheet       Latest Ref Rng & Units 04/16/2022 10/24/2023 11/01/2023  Labs for ITP Cardiac and Pulmonary Rehab  Hemoglobin A1c 4.8 - 5.6 % 4.4  - -  PH, Arterial 7.35 - 7.45 - - 7.401   PCO2 arterial 32 - 48 mmHg - - 38.9   Bicarbonate 20.0 - 28.0 mmol/L - - 25.3  24.2   TCO2 22 - 32 mmol/L - 20  27  25    O2 Saturation % - - 73  92  Details       Multiple values from one day are sorted in reverse-chronological order         Capillary Blood Glucose: No results found for: "GLUCAP"   Exercise Target Goals: Exercise Program Goal: Individual exercise prescription set using results from initial 6 min walk test and THRR while considering  patient's activity barriers and safety.   Exercise Prescription Goal: Initial exercise prescription builds to 30-45 minutes a day of aerobic activity, 2-3 days per week.  Home exercise guidelines will be given to patient during program as part of exercise prescription that the participant will acknowledge.  Activity Barriers & Risk Stratification:  Activity Barriers & Cardiac Risk Stratification - 12/20/23 1605       Activity Barriers & Cardiac  Risk Stratification   Activity Barriers Deconditioning;History of Falls    Cardiac Risk Stratification High             6 Minute Walk:  6 Minute Walk     Row Name 12/20/23 1601         6 Minute Walk   Phase Initial     Distance 1745 feet     Walk Time 6 minutes     # of Rest Breaks 0     MPH 3.3     METS 3.64     RPE 7     Perceived Dyspnea  0     VO2 Peak 12.75     Symptoms Yes (comment)     Comments Post walk test patient states she felt "swimmy headed", patient denigns SOB, light headed feeling or visual changes. Resolved within 30 seconds. Orthostatics seated 98/63 standing 93/63 asymptomatic.     Resting HR 75 bpm     Resting BP 104/60     Resting Oxygen Saturation  98 %     Exercise Oxygen Saturation  during 6 min walk 100 %     Max Ex. HR 88 bpm     Max Ex. BP 116/62     2 Minute Post BP 100/60              Oxygen Initial Assessment:   Oxygen Re-Evaluation:   Oxygen Discharge (Final Oxygen Re-Evaluation):   Initial Exercise Prescription:  Initial Exercise Prescription - 12/20/23 1500       Date of Initial Exercise RX and Referring Provider   Date 12/20/23    Referring Provider Dinah Franco, MD    Expected Discharge Date 03/12/24      NuStep   Level 1    SPM 80    Minutes 15    METs 1.8      Recumbant Elliptical   Level 1    RPM 40    Watts 60    Minutes 15    METs 2.1      Prescription Details   Frequency (times per week) 3    Duration Progress to 30 minutes of continuous aerobic without signs/symptoms of physical distress      Intensity   THRR 40-80% of Max Heartrate 60-119    Ratings of Perceived Exertion 11-13    Perceived Dyspnea 0-4      Progression   Progression Continue progressive overload as per policy without signs/symptoms or physical distress.      Resistance Training   Training Prescription Yes    Reps 10-15             Perform Capillary Blood Glucose checks as needed.  Exercise Prescription  Changes:  Exercise Prescription Changes     Row Name 12/28/23 1029 01/07/24 1033           Response to Exercise   Blood Pressure (Admit) 94/62 98/70      Blood Pressure (Exercise) 110/60 108/60      Blood Pressure (Exit) 90/60 100/60      Heart Rate (Admit) 74 bpm 80 bpm      Heart Rate (Exercise) 102 bpm 106 bpm      Heart Rate (Exit) 76 bpm 89 bpm      Symptoms None None      Comments Off to a great start with exercise. --      Duration Continue with 30 min of aerobic exercise without signs/symptoms of physical distress. Continue with 30 min of aerobic exercise without signs/symptoms of physical distress.      Intensity THRR unchanged THRR unchanged        Progression   Progression Continue to progress workloads to maintain intensity without signs/symptoms of physical distress. Continue to progress workloads to maintain intensity without signs/symptoms of physical distress.      Average METs 4.1 3.7        Resistance Training   Training Prescription Yes Yes      Weight 2 lbs 2 lbs      Reps 10-15 10-15      Time 10 Minutes 10 Minutes        Interval Training   Interval Training No No        NuStep   Level 1 2      SPM -- 121      Minutes 15 15      METs -- 3        Recumbant Elliptical   Level 1 1      RPM 38 60      Watts 74 76      Minutes 15 15      METs 4.1 3               Exercise Comments:   Exercise Comments     Row Name 12/28/23 1131           Exercise Comments Joyous tolerated first session of exercise well without symptoms. Oriented to the exercise equipment and stretchign routine.                Exercise Goals and Review:   Exercise Goals     Row Name 12/20/23 1528             Exercise Goals   Increase Physical Activity Yes       Intervention Provide advice, education, support and counseling about physical activity/exercise needs.;Develop an individualized exercise prescription for aerobic and resistive training based  on initial evaluation findings, risk stratification, comorbidities and participant's personal goals.       Expected Outcomes Short Term: Attend rehab on a regular basis to increase amount of physical activity.;Long Term: Exercising regularly at least 3-5 days a week.;Long Term: Add in home exercise to make exercise part of routine and to increase amount of physical activity.       Increase Strength and Stamina Yes       Intervention Provide advice, education, support and counseling about physical activity/exercise needs.;Develop an individualized exercise prescription for aerobic and resistive training based on initial evaluation findings, risk stratification, comorbidities and participant's personal goals.       Expected Outcomes Short Term: Increase workloads from initial exercise prescription for resistance, speed,  and METs.;Short Term: Perform resistance training exercises routinely during rehab and add in resistance training at home;Long Term: Improve cardiorespiratory fitness, muscular endurance and strength as measured by increased METs and functional capacity ( )       Able to understand and use rate of perceived exertion (RPE) scale Yes       Intervention Provide education and explanation on how to use RPE scale       Expected Outcomes Long Term:  Able to use RPE to guide intensity level when exercising independently;Short Term: Able to use RPE daily in rehab to express subjective intensity level       Knowledge and understanding of Target Heart Rate Range (THRR) Yes       Intervention Provide education and explanation of THRR including how the numbers were predicted and where they are located for reference       Expected Outcomes Short Term: Able to state/look up THRR;Short Term: Able to use daily as guideline for intensity in rehab;Long Term: Able to use THRR to govern intensity when exercising independently       Understanding of Exercise Prescription Yes       Intervention Provide  education, explanation, and written materials on patient's individual exercise prescription       Expected Outcomes Short Term: Able to explain program exercise prescription;Long Term: Able to explain home exercise prescription to exercise independently                Exercise Goals Re-Evaluation :  Exercise Goals Re-Evaluation     Row Name 12/28/23 1131             Exercise Goal Re-Evaluation   Exercise Goals Review Increase Physical Activity;Increase Strength and Stamina;Able to understand and use rate of perceived exertion (RPE) scale       Comments Kristina Osborne was able to understand and use RPE scale appropriately.       Expected Outcomes Progress workloads as tolerated to help improve cardiorespiratory fitness.                Discharge Exercise Prescription (Final Exercise Prescription Changes):  Exercise Prescription Changes - 01/07/24 1033       Response to Exercise   Blood Pressure (Admit) 98/70    Blood Pressure (Exercise) 108/60    Blood Pressure (Exit) 100/60    Heart Rate (Admit) 80 bpm    Heart Rate (Exercise) 106 bpm    Heart Rate (Exit) 89 bpm    Symptoms None    Duration Continue with 30 min of aerobic exercise without signs/symptoms of physical distress.    Intensity THRR unchanged      Progression   Progression Continue to progress workloads to maintain intensity without signs/symptoms of physical distress.    Average METs 3.7      Resistance Training   Training Prescription Yes    Weight 2 lbs    Reps 10-15    Time 10 Minutes      Interval Training   Interval Training No      NuStep   Level 2    SPM 121    Minutes 15    METs 3      Recumbant Elliptical   Level 1    RPM 60    Watts 76    Minutes 15    METs 3             Nutrition:  Target Goals: Understanding of nutrition guidelines, daily intake of sodium 1500mg , cholesterol 200mg ,  calories 30% from fat and 7% or less from saturated fats, daily to have 5 or more servings  of fruits and vegetables.  Biometrics:   Post Biometrics - 12/20/23 1527        Post  Biometrics   Waist Circumference 30.5 inches    Hip Circumference 38 inches    Waist to Hip Ratio 0.8 %    Triceps Skinfold 21 mm    % Body Fat 33.4 %    Grip Strength 20 kg    Flexibility 18.25 in    Single Leg Stand 30 seconds             Nutrition Therapy Plan and Nutrition Goals:  Nutrition Therapy & Goals - 12/28/23 1138       Nutrition Therapy   Diet Heart Healthy Diet      Personal Nutrition Goals   Nutrition Goal Patient to identify strategies for reducing cardiovascular risk by attending the Pritikin education and nutrition series weekly.    Personal Goal #2 Patient to improve diet quality by using the plate method as a guide for meal planning to include lean protein/plant protein, fruits, vegetables, whole grains, nonfat dairy as part of a well-balanced diet.    Comments Kristina Osborne has medical history of pulmonary HTN, HFpEF, s/p mitraclip, hx gi bleed, thrombocytosis (hydroxurea). She reports that her husband recently had a stroke and is working on weight gain. She admits to additional stress carrying for him and is eating out more frequently. She is motivated to improve eating habits and opt for convenience cooking methods. She continues regular follow-up with oncology related to iron  deficiency, thrombocytosis. Patient will benefit from participation in intensive cardiac rehab for nutrition, exercise, and lifestyle modification.      Intervention Plan   Intervention Prescribe, educate and counsel regarding individualized specific dietary modifications aiming towards targeted core components such as weight, hypertension, lipid management, diabetes, heart failure and other comorbidities.;Nutrition handout(s) given to patient.    Expected Outcomes Short Term Goal: Understand basic principles of dietary content, such as calories, fat, sodium, cholesterol and nutrients.;Long Term Goal:  Adherence to prescribed nutrition plan.             Nutrition Assessments:  MEDIFICTS Score Key: >=70 Need to make dietary changes  40-70 Heart Healthy Diet <= 40 Therapeutic Level Cholesterol Diet    Picture Your Plate Scores: <40 Unhealthy dietary pattern with much room for improvement. 41-50 Dietary pattern unlikely to meet recommendations for good health and room for improvement. 51-60 More healthful dietary pattern, with some room for improvement.  >60 Healthy dietary pattern, although there may be some specific behaviors that could be improved.    Nutrition Goals Re-Evaluation:  Nutrition Goals Re-Evaluation     Row Name 12/28/23 1138             Goals   Current Weight 143 lb 8.3 oz (65.1 kg)       Expected Outcome Kristina Osborne has medical history of pulmonary HTN, HFpEF, s/p mitraclip, hx gi bleed, thrombocytosis (hydroxurea). She reports that her husband recently had a stroke and is working on weight gain. She admits to additional stress carrying for him and is eating out more frequently. She is motivated to improve eating habits and opt for convenience cooking methods. She continues regular follow-up with oncology related to iron  deficiency, thrombocytosis. Patient will benefit from participation in intensive cardiac rehab for nutrition, exercise, and lifestyle modification.  Nutrition Goals Re-Evaluation:  Nutrition Goals Re-Evaluation     Row Name 12/28/23 1138             Goals   Current Weight 143 lb 8.3 oz (65.1 kg)       Expected Outcome Kristina Osborne has medical history of pulmonary HTN, HFpEF, s/p mitraclip, hx gi bleed, thrombocytosis (hydroxurea). She reports that her husband recently had a stroke and is working on weight gain. She admits to additional stress carrying for him and is eating out more frequently. She is motivated to improve eating habits and opt for convenience cooking methods. She continues regular follow-up with oncology related  to iron  deficiency, thrombocytosis. Patient will benefit from participation in intensive cardiac rehab for nutrition, exercise, and lifestyle modification.                Nutrition Goals Discharge (Final Nutrition Goals Re-Evaluation):  Nutrition Goals Re-Evaluation - 12/28/23 1138       Goals   Current Weight 143 lb 8.3 oz (65.1 kg)    Expected Outcome Kristina Osborne has medical history of pulmonary HTN, HFpEF, s/p mitraclip, hx gi bleed, thrombocytosis (hydroxurea). She reports that her husband recently had a stroke and is working on weight gain. She admits to additional stress carrying for him and is eating out more frequently. She is motivated to improve eating habits and opt for convenience cooking methods. She continues regular follow-up with oncology related to iron  deficiency, thrombocytosis. Patient will benefit from participation in intensive cardiac rehab for nutrition, exercise, and lifestyle modification.             Psychosocial: Target Goals: Acknowledge presence or absence of significant depression and/or stress, maximize coping skills, provide positive support system. Participant is able to verbalize types and ability to use techniques and skills needed for reducing stress and depression.  Initial Review & Psychosocial Screening:  Initial Psych Review & Screening - 12/20/23 1617       Initial Review   Current issues with Current Sleep Concerns;Current Stress Concerns    Source of Stress Concerns Chronic Illness;Family;Financial;Unable to perform yard/household activities    Comments She is feeling some stress with her family needs and issues with chronic fatigue. She's having a hard time with stamina to keep up an active lifestyle. Not interested in counseling at this time, but she knows to ask any time for additional resources      Family Dynamics   Good Support System? Yes      Barriers   Psychosocial barriers to participate in program The patient should benefit from  training in stress management and relaxation.;Psychosocial barriers identified (see note)      Screening Interventions   Interventions Encouraged to exercise;To provide support and resources with identified psychosocial needs;Provide feedback about the scores to participant    Expected Outcomes Long Term Goal: Stressors or current issues are controlled or eliminated.;Short Term goal: Identification and review with participant of any Quality of Life or Depression concerns found by scoring the questionnaire.;Long Term goal: The participant improves quality of Life and PHQ9 Scores as seen by post scores and/or verbalization of changes             Quality of Life Scores:  Quality of Life - 12/20/23 1621       Quality of Life   Select Quality of Life      Quality of Life Scores   Health/Function Pre 18.03 %    Socioeconomic Pre 25.25 %    Psych/Spiritual Pre 16.5 %  Family Pre 21 %    GLOBAL Pre 19.42 %            Scores of 19 and below usually indicate a poorer quality of life in these areas.  A difference of  2-3 points is a clinically meaningful difference.  A difference of 2-3 points in the total score of the Quality of Life Index has been associated with significant improvement in overall quality of life, self-image, physical symptoms, and general health in studies assessing change in quality of life.  PHQ-9: Review Flowsheet  More data exists      12/20/2023 10/18/2023 09/06/2023 08/09/2023 06/26/2023  Depression screen PHQ 2/9  Decreased Interest 0 0 0 0 0  Down, Depressed, Hopeless 0 0 0 0 0  PHQ - 2 Score 0 0 0 0 0  Altered sleeping 2 - - - -  Tired, decreased energy 3 - - - -  Change in appetite 0 - - - -  Feeling bad or failure about yourself  0 - - - -  Trouble concentrating 0 - - - -  Moving slowly or fidgety/restless 0 - - - -  Suicidal thoughts 0 - - - -  PHQ-9 Score 5 - - - -  Difficult doing work/chores Somewhat difficult - - - -   Interpretation of Total  Score  Total Score Depression Severity:  1-4 = Minimal depression, 5-9 = Mild depression, 10-14 = Moderate depression, 15-19 = Moderately severe depression, 20-27 = Severe depression   Psychosocial Evaluation and Intervention:   Psychosocial Re-Evaluation:  Psychosocial Re-Evaluation     Row Name 12/31/23 0839 01/03/24 1040 01/15/24 1310         Psychosocial Re-Evaluation   Current issues with Current Stress Concerns;Current Sleep Concerns Current Stress Concerns;Current Sleep Concerns Current Stress Concerns;Current Sleep Concerns     Comments Kristina Osborne did not voice any increased concerns or stressors on her first day of exercise. Wll review quality of life and PHQ2-9 and quality of life in the upcoming week Reviewed quality of life and PHQ2-9.Kristina Osborne denies being depressed currently. 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 Kristina Osborne says she is now feeling better and has a little more energy since starting exercise at cardiac rehab.Kristina Osborne said she had home health and found it to be intrusive. Kristina Osborne's husband has an appointment to be evaluated for neuro rehab. Kristina Osborne says she sometimes has difficulty staying asleep. Discussed sleep hygiene methods. Kristina Osborne is pleased with her husbands appointment with neuor rehab as he will be getting therapy twice a week.     Expected Outcomes Kristina Osborne will have controlled or decreased stressors upon compleiton of cardiac rehab Kristina Osborne will have controlled or decreased stressors upon compleiton of cardiac rehab Kristina Osborne will have controlled or decreased stressors upon compleiton of cardiac rehab     Interventions Stress management education;Encouraged to attend Cardiac Rehabilitation for the exercise;Relaxation education Stress management education;Encouraged to attend Cardiac Rehabilitation for the exercise;Relaxation education Stress management education;Encouraged to attend Cardiac Rehabilitation for the exercise;Relaxation education     Continue  Psychosocial Services  Follow up required by staff Follow up required by staff Follow up required by staff       Initial Review   Source of Stress Concerns Family;Chronic Illness Family;Chronic Illness Family;Chronic Illness     Comments Will continue to monitor and offer support as needed Will continue to monitor and offer support as needed Will continue to monitor and offer support as needed  Psychosocial Discharge (Final Psychosocial Re-Evaluation):  Psychosocial Re-Evaluation - 01/15/24 1310       Psychosocial Re-Evaluation   Current issues with Current Stress Concerns;Current Sleep Concerns    Comments Kristina Osborne is pleased with her husbands appointment with neuor rehab as he will be getting therapy twice a week.    Expected Outcomes Kristina Osborne will have controlled or decreased stressors upon compleiton of cardiac rehab    Interventions Stress management education;Encouraged to attend Cardiac Rehabilitation for the exercise;Relaxation education    Continue Psychosocial Services  Follow up required by staff      Initial Review   Source of Stress Concerns Family;Chronic Illness    Comments Will continue to monitor and offer support as needed             Vocational Rehabilitation: Provide vocational rehab assistance to qualifying candidates.   Vocational Rehab Evaluation & Intervention:  Vocational Rehab - 12/20/23 1529       Initial Vocational Rehab Evaluation & Intervention   Assessment shows need for Vocational Rehabilitation No   retired            Education: Education Goals: Education classes will be provided on a weekly basis, covering required topics. Participant will state understanding/return demonstration of topics presented.     Core Videos: Exercise    Move It!  Clinical staff conducted group or individual video education with verbal and written material and guidebook.  Patient learns the recommended Pritikin exercise program. Exercise with  the goal of living a long, healthy life. Some of the health benefits of exercise include controlled diabetes, healthier blood pressure levels, improved cholesterol levels, improved heart and lung capacity, improved sleep, and better body composition. Everyone should speak with their doctor before starting or changing an exercise routine.  Biomechanical Limitations Clinical staff conducted group or individual video education with verbal and written material and guidebook.  Patient learns how biomechanical limitations can impact exercise and how we can mitigate and possibly overcome limitations to have an impactful and balanced exercise routine.  Body Composition Clinical staff conducted group or individual video education with verbal and written material and guidebook.  Patient learns that body composition (ratio of muscle mass to fat mass) is a key component to assessing overall fitness, rather than body weight alone. Increased fat mass, especially visceral belly fat, can put us  at increased risk for metabolic syndrome, type 2 diabetes, heart disease, and even death. It is recommended to combine diet and exercise (cardiovascular and resistance training) to improve your body composition. Seek guidance from your physician and exercise physiologist before implementing an exercise routine.  Exercise Action Plan Clinical staff conducted group or individual video education with verbal and written material and guidebook.  Patient learns the recommended strategies to achieve and enjoy long-term exercise adherence, including variety, self-motivation, self-efficacy, and positive decision making. Benefits of exercise include fitness, good health, weight management, more energy, better sleep, less stress, and overall well-being.  Medical   Heart Disease Risk Reduction Clinical staff conducted group or individual video education with verbal and written material and guidebook.  Patient learns our heart is our  most vital organ as it circulates oxygen, nutrients, white blood cells, and hormones throughout the entire body, and carries waste away. Data supports a plant-based eating plan like the Pritikin Program for its effectiveness in slowing progression of and reversing heart disease. The video provides a number of recommendations to address heart disease.   Metabolic Syndrome and Belly Fat  Clinical staff conducted group  or individual video education with verbal and written material and guidebook.  Patient learns what metabolic syndrome is, how it leads to heart disease, and how one can reverse it and keep it from coming back. You have metabolic syndrome if you have 3 of the following 5 criteria: abdominal obesity, high blood pressure, high triglycerides, low HDL cholesterol, and high blood sugar.  Hypertension and Heart Disease Clinical staff conducted group or individual video education with verbal and written material and guidebook.  Patient learns that high blood pressure, or hypertension, is very common in the [place] . Hypertension is largely due to excessive salt intake, but other important risk factors include being overweight, physical inactivity, drinking too much alcohol, smoking, and not eating enough potassium from fruits and vegetables. High blood pressure is a leading risk factor for heart attack, stroke, congestive heart failure, dementia, kidney failure, and premature death. Long-term effects of excessive salt intake include stiffening of the arteries and thickening of heart muscle and organ damage. Recommendations include ways to reduce hypertension and the risk of heart disease.  Diseases of Our Time - Focusing on Diabetes Clinical staff conducted group or individual video education with verbal and written material and guidebook.  Patient learns why the best way to stop diseases of our time is prevention, through food and other lifestyle changes. Medicine (such as prescription pills  and surgeries) is often only a Band-Aid on the problem, not a long-term solution. Most common diseases of our time include obesity, type 2 diabetes, hypertension, heart disease, and cancer. The Pritikin Program is recommended and has been proven to help reduce, reverse, and/or prevent the damaging effects of metabolic syndrome.  Nutrition   Overview of the Pritikin Eating Plan  Clinical staff conducted group or individual video education with verbal and written material and guidebook.  Patient learns about the Pritikin Eating Plan for disease risk reduction. The Pritikin Eating Plan emphasizes a wide variety of unrefined, minimally-processed carbohydrates, like fruits, vegetables, whole grains, and legumes. Go, Caution, and Stop food choices are explained. Plant-based and lean animal proteins are emphasized. Rationale provided for low sodium intake for blood pressure control, low added sugars for blood sugar stabilization, and low added fats and oils for coronary artery disease risk reduction and weight management.  Calorie Density  Clinical staff conducted group or individual video education with verbal and written material and guidebook.  Patient learns about calorie density and how it impacts the Pritikin Eating Plan. Knowing the characteristics of the food you choose will help you decide whether those foods will lead to weight gain or weight loss, and whether you want to consume more or less of them. Weight loss is usually a side effect of the Pritikin Eating Plan because of its focus on low calorie-dense foods.  Label Reading  Clinical staff conducted group or individual video education with verbal and written material and guidebook.  Patient learns about the Pritikin recommended label reading guidelines and corresponding recommendations regarding calorie density, added sugars, sodium content, and whole grains.  Dining Out - Part 1  Clinical staff conducted group or individual video education  with verbal and written material and guidebook.  Patient learns that restaurant meals can be sabotaging because they can be so high in calories, fat, sodium, and/or sugar. Patient learns recommended strategies on how to positively address this and avoid unhealthy pitfalls.  Facts on Fats  Clinical staff conducted group or individual video education with verbal and written material and guidebook.  Patient learns that  lifestyle modifications can be just as effective, if not more so, as many medications for lowering your risk of heart disease. A Pritikin lifestyle can help to reduce your risk of inflammation and atherosclerosis (cholesterol build-up, or plaque, in the artery walls). Lifestyle interventions such as dietary choices and physical activity address the cause of atherosclerosis. A review of the types of fats and their impact on blood cholesterol levels, along with dietary recommendations to reduce fat intake is also included.  Nutrition Action Plan  Clinical staff conducted group or individual video education with verbal and written material and guidebook.  Patient learns how to incorporate Pritikin recommendations into their lifestyle. Recommendations include planning and keeping personal health goals in mind as an important part of their success.  Healthy Mind-Set    Healthy Minds, Bodies, Hearts  Clinical staff conducted group or individual video education with verbal and written material and guidebook.  Patient learns how to identify when they are stressed. Video will discuss the impact of that stress, as well as the many benefits of stress management. Patient will also be introduced to stress management techniques. The way we think, act, and feel has an impact on our hearts.  How Our Thoughts Can Heal Our Hearts  Clinical staff conducted group or individual video education with verbal and written material and guidebook.  Patient learns that negative thoughts can cause depression and  anxiety. This can result in negative lifestyle behavior and serious health problems. Cognitive behavioral therapy is an effective method to help control our thoughts in order to change and improve our emotional outlook.  Additional Videos:  Exercise    Improving Performance  Clinical staff conducted group or individual video education with verbal and written material and guidebook.  Patient learns to use a non-linear approach by alternating intensity levels and lengths of time spent exercising to help burn more calories and lose more body fat. Cardiovascular exercise helps improve heart health, metabolism, hormonal balance, blood sugar control, and recovery from fatigue. Resistance training improves strength, endurance, balance, coordination, reaction time, metabolism, and muscle mass. Flexibility exercise improves circulation, posture, and balance. Seek guidance from your physician and exercise physiologist before implementing an exercise routine and learn your capabilities and proper form for all exercise.  Introduction to Yoga  Clinical staff conducted group or individual video education with verbal and written material and guidebook.  Patient learns about yoga, a discipline of the coming together of mind, breath, and body. The benefits of yoga include improved flexibility, improved range of motion, better posture and core strength, increased lung function, weight loss, and positive self-image. Yoga's heart health benefits include lowered blood pressure, healthier heart rate, decreased cholesterol and triglyceride levels, improved immune function, and reduced stress. Seek guidance from your physician and exercise physiologist before implementing an exercise routine and learn your capabilities and proper form for all exercise.  Medical   Aging: Enhancing Your Quality of Life  Clinical staff conducted group or individual video education with verbal and written material and guidebook.  Patient learns  key strategies and recommendations to stay in good physical health and enhance quality of life, such as prevention strategies, having an advocate, securing a Health Care Proxy and Power of Attorney, and keeping a list of medications and system for tracking them. It also discusses how to avoid risk for bone loss.  Biology of Weight Control  Clinical staff conducted group or individual video education with verbal and written material and guidebook.  Patient learns that weight gain occurs  because we consume more calories than we burn (eating more, moving less). Even if your body weight is normal, you may have higher ratios of fat compared to muscle mass. Too much body fat puts you at increased risk for cardiovascular disease, heart attack, stroke, type 2 diabetes, and obesity-related cancers. In addition to exercise, following the Pritikin Eating Plan can help reduce your risk.  Decoding Lab Results  Clinical staff conducted group or individual video education with verbal and written material and guidebook.  Patient learns that lab test reflects one measurement whose values change over time and are influenced by many factors, including medication, stress, sleep, exercise, food, hydration, pre-existing medical conditions, and more. It is recommended to use the knowledge from this video to become more involved with your lab results and evaluate your numbers to speak with your doctor.   Diseases of Our Time - Overview  Clinical staff conducted group or individual video education with verbal and written material and guidebook.  Patient learns that according to the CDC, 50% to 70% of chronic diseases (such as obesity, type 2 diabetes, elevated lipids, hypertension, and heart disease) are avoidable through lifestyle improvements including healthier food choices, listening to satiety cues, and increased physical activity.  Sleep Disorders Clinical staff conducted group or individual video education with verbal  and written material and guidebook.  Patient learns how good quality and duration of sleep are important to overall health and well-being. Patient also learns about sleep disorders and how they impact health along with recommendations to address them, including discussing with a physician.  Nutrition  Dining Out - Part 2 Clinical staff conducted group or individual video education with verbal and written material and guidebook.  Patient learns how to plan ahead and communicate in order to maximize their dining experience in a healthy and nutritious manner. Included are recommended food choices based on the type of restaurant the patient is visiting.   Fueling a Banker conducted group or individual video education with verbal and written material and guidebook.  There is a strong connection between our food choices and our health. Diseases like obesity and type 2 diabetes are very prevalent and are in large-part due to lifestyle choices. The Pritikin Eating Plan provides plenty of food and hunger-curbing satisfaction. It is easy to follow, affordable, and helps reduce health risks.  Menu Workshop  Clinical staff conducted group or individual video education with verbal and written material and guidebook.  Patient learns that restaurant meals can sabotage health goals because they are often packed with calories, fat, sodium, and sugar. Recommendations include strategies to plan ahead and to communicate with the manager, chef, or server to help order a healthier meal.  Planning Your Eating Strategy  Clinical staff conducted group or individual video education with verbal and written material and guidebook.  Patient learns about the Pritikin Eating Plan and its benefit of reducing the risk of disease. The Pritikin Eating Plan does not focus on calories. Instead, it emphasizes high-quality, nutrient-rich foods. By knowing the characteristics of the foods, we choose, we can  determine their calorie density and make informed decisions.  Targeting Your Nutrition Priorities  Clinical staff conducted group or individual video education with verbal and written material and guidebook.  Patient learns that lifestyle habits have a tremendous impact on disease risk and progression. This video provides eating and physical activity recommendations based on your personal health goals, such as reducing LDL cholesterol, losing weight, preventing or controlling type 2  diabetes, and reducing high blood pressure.  Vitamins and Minerals  Clinical staff conducted group or individual video education with verbal and written material and guidebook.  Patient learns different ways to obtain key vitamins and minerals, including through a recommended healthy diet. It is important to discuss all supplements you take with your doctor.   Healthy Mind-Set    Smoking Cessation  Clinical staff conducted group or individual video education with verbal and written material and guidebook.  Patient learns that cigarette smoking and tobacco addiction pose a serious health risk which affects millions of people. Stopping smoking will significantly reduce the risk of heart disease, lung disease, and many forms of cancer. Recommended strategies for quitting are covered, including working with your doctor to develop a successful plan.  Culinary   Becoming a Set designer conducted group or individual video education with verbal and written material and guidebook.  Patient learns that cooking at home can be healthy, cost-effective, quick, and puts them in control. Keys to cooking healthy recipes will include looking at your recipe, assessing your equipment needs, planning ahead, making it simple, choosing cost-effective seasonal ingredients, and limiting the use of added fats, salts, and sugars.  Cooking - Breakfast and Snacks  Clinical staff conducted group or individual video education  with verbal and written material and guidebook.  Patient learns how important breakfast is to satiety and nutrition through the entire day. Recommendations include key foods to eat during breakfast to help stabilize blood sugar levels and to prevent overeating at meals later in the day. Planning ahead is also a key component.  Cooking - Educational psychologist conducted group or individual video education with verbal and written material and guidebook.  Patient learns eating strategies to improve overall health, including an approach to cook more at home. Recommendations include thinking of animal protein as a side on your plate rather than center stage and focusing instead on lower calorie dense options like vegetables, fruits, whole grains, and plant-based proteins, such as beans. Making sauces in large quantities to freeze for later and leaving the skin on your vegetables are also recommended to maximize your experience.  Cooking - Healthy Salads and Dressing Clinical staff conducted group or individual video education with verbal and written material and guidebook.  Patient learns that vegetables, fruits, whole grains, and legumes are the foundations of the Pritikin Eating Plan. Recommendations include how to incorporate each of these in flavorful and healthy salads, and how to create homemade salad dressings. Proper handling of ingredients is also covered. Cooking - Soups and [employer] - Soups and Desserts Clinical staff conducted group or individual video education with verbal and written material and guidebook.  Patient learns that Pritikin soups and desserts make for easy, nutritious, and delicious snacks and meal components that are low in sodium, fat, sugar, and calorie density, while high in vitamins, minerals, and filling fiber. Recommendations include simple and healthy ideas for soups and desserts.   Overview     The Pritikin Solution Program Overview Clinical staff  conducted group or individual video education with verbal and written material and guidebook.  Patient learns that the results of the Pritikin Program have been documented in more than 100 articles published in peer-reviewed journals, and the benefits include reducing risk factors for (and, in some cases, even reversing) high cholesterol, high blood pressure, type 2 diabetes, obesity, and more! An overview of the three key pillars of the Pritikin Program will be  covered: eating well, doing regular exercise, and having a healthy mind-set.  WORKSHOPS  Exercise: Exercise Basics: Building Your Action Plan Clinical staff led group instruction and group discussion with PowerPoint presentation and patient guidebook. To enhance the learning environment the use of posters, models and videos may be added. At the conclusion of this workshop, patients will comprehend the difference between physical activity and exercise, as well as the benefits of incorporating both, into their routine. Patients will understand the FITT (Frequency, Intensity, Time, and Type) principle and how to use it to build an exercise action plan. In addition, safety concerns and other considerations for exercise and cardiac rehab will be addressed by the presenter. The purpose of this lesson is to promote a comprehensive and effective weekly exercise routine in order to improve patients' overall level of fitness.   Managing Heart Disease: Your Path to a Healthier Heart Clinical staff led group instruction and group discussion with PowerPoint presentation and patient guidebook. To enhance the learning environment the use of posters, models and videos may be added.At the conclusion of this workshop, patients will understand the anatomy and physiology of the heart. Additionally, they will understand how Pritikin's three pillars impact the risk factors, the progression, and the management of heart disease.  The purpose of this lesson is to  provide a high-level overview of the heart, heart disease, and how the Pritikin lifestyle positively impacts risk factors.  Exercise Biomechanics Clinical staff led group instruction and group discussion with PowerPoint presentation and patient guidebook. To enhance the learning environment the use of posters, models and videos may be added. Patients will learn how the structural parts of their bodies function and how these functions impact their daily activities, movement, and exercise. Patients will learn how to promote a neutral spine, learn how to manage pain, and identify ways to improve their physical movement in order to promote healthy living. The purpose of this lesson is to expose patients to common physical limitations that impact physical activity. Participants will learn practical ways to adapt and manage aches and pains, and to minimize their effect on regular exercise. Patients will learn how to maintain good posture while sitting, walking, and lifting.  Balance Training and Fall Prevention  Clinical staff led group instruction and group discussion with PowerPoint presentation and patient guidebook. To enhance the learning environment the use of posters, models and videos may be added. At the conclusion of this workshop, patients will understand the importance of their sensorimotor skills (vision, proprioception, and the vestibular system) in maintaining their ability to balance as they age. Patients will apply a variety of balancing exercises that are appropriate for their current level of function. Patients will understand the common causes for poor balance, possible solutions to these problems, and ways to modify their physical environment in order to minimize their fall risk. The purpose of this lesson is to teach patients about the importance of maintaining balance as they age and ways to minimize their risk of falling.  WORKSHOPS   Nutrition:  Fueling a Ship broker led group instruction and group discussion with PowerPoint presentation and patient guidebook. To enhance the learning environment the use of posters, models and videos may be added. Patients will review the foundational principles of the Pritikin Eating Plan and understand what constitutes a serving size in each of the food groups. Patients will also learn Pritikin-friendly foods that are better choices when away from home and review make-ahead meal and snack options. Calorie density will  be reviewed and applied to three nutrition priorities: weight maintenance, weight loss, and weight gain. The purpose of this lesson is to reinforce (in a group setting) the key concepts around what patients are recommended to eat and how to apply these guidelines when away from home by planning and selecting Pritikin-friendly options. Patients will understand how calorie density may be adjusted for different weight management goals.  Mindful Eating  Clinical staff led group instruction and group discussion with PowerPoint presentation and patient guidebook. To enhance the learning environment the use of posters, models and videos may be added. Patients will briefly review the concepts of the Pritikin Eating Plan and the importance of low-calorie dense foods. The concept of mindful eating will be introduced as well as the importance of paying attention to internal hunger signals. Triggers for non-hunger eating and techniques for dealing with triggers will be explored. The purpose of this lesson is to provide patients with the opportunity to review the basic principles of the Pritikin Eating Plan, discuss the value of eating mindfully and how to measure internal cues of hunger and fullness using the Hunger Scale. Patients will also discuss reasons for non-hunger eating and learn strategies to use for controlling emotional eating.  Targeting Your Nutrition Priorities Clinical staff led group instruction  and group discussion with PowerPoint presentation and patient guidebook. To enhance the learning environment the use of posters, models and videos may be added. Patients will learn how to determine their genetic susceptibility to disease by reviewing their family history. Patients will gain insight into the importance of diet as part of an overall healthy lifestyle in mitigating the impact of genetics and other environmental insults. The purpose of this lesson is to provide patients with the opportunity to assess their personal nutrition priorities by looking at their family history, their own health history and current risk factors. Patients will also be able to discuss ways of prioritizing and modifying the Pritikin Eating Plan for their highest risk areas  Menu  Clinical staff led group instruction and group discussion with PowerPoint presentation and patient guidebook. To enhance the learning environment the use of posters, models and videos may be added. Using menus brought in from [employer], or printed from [employer], patients will apply the Pritikin dining out guidelines that were presented in the [employer] video. Patients will also be able to practice these guidelines in a variety of provided scenarios. The purpose of this lesson is to provide patients with the opportunity to practice hands-on learning of the Pritikin Dining Out guidelines with actual menus and practice scenarios.  Label Reading Clinical staff led group instruction and group discussion with PowerPoint presentation and patient guidebook. To enhance the learning environment the use of posters, models and videos may be added. Patients will review and discuss the Pritikin label reading guidelines presented in Pritikin's Label Reading Educational series video. Using fool labels brought in from local grocery stores and markets, patients will apply the label reading guidelines and determine if the packaged  food meet the Pritikin guidelines. The purpose of this lesson is to provide patients with the opportunity to review, discuss, and practice hands-on learning of the Pritikin Label Reading guidelines with actual packaged food labels. Cooking School  Pritikin's [employer] are designed to teach patients ways to prepare quick, simple, and affordable recipes at home. The importance of nutrition's role in chronic disease risk reduction is reflected in its emphasis in the overall Pritikin program. By learning how to  prepare essential core Pritikin Eating Plan recipes, patients will increase control over what they eat; be able to customize the flavor of foods without the use of added salt, sugar, or fat; and improve the quality of the food they consume. By learning a set of core recipes which are easily assembled, quickly prepared, and affordable, patients are more likely to prepare more healthy foods at home. These workshops focus on convenient breakfasts, simple entres, side dishes, and desserts which can be prepared with minimal effort and are consistent with nutrition recommendations for cardiovascular risk reduction. Cooking [employer] are taught by a Armed forces logistics/support/administrative officer (RD) who has been trained by the [employer]. The chef or RD has a clear understanding of the importance of minimizing - if not completely eliminating - added fat, sugar, and sodium in recipes. Throughout the series of Cooking School Workshop sessions, patients will learn about healthy ingredients and efficient methods of cooking to build confidence in their capability to prepare    Cooking School weekly topics:  Adding Flavor- Sodium-Free  Fast and Healthy Breakfasts  Powerhouse Plant-Based Proteins  Satisfying Salads and Dressings  Simple Sides and Sauces  International Cuisine-Spotlight on the [employer] Zones  Delicious Desserts  Savory Soups  [employer] - Meals in a Astronomer  Appetizers and Snacks  Comforting Weekend Breakfasts  One-Pot Wonders   Fast Evening Meals  Landscape architect Your Pritikin Plate  WORKSHOPS   Healthy Mindset (Psychosocial):  Focused Goals, Sustainable Changes Clinical staff led group instruction and group discussion with PowerPoint presentation and patient guidebook. To enhance the learning environment the use of posters, models and videos may be added. Patients will be able to apply effective goal setting strategies to establish at least one personal goal, and then take consistent, meaningful action toward that goal. They will learn to identify common barriers to achieving personal goals and develop strategies to overcome them. Patients will also gain an understanding of how our mind-set can impact our ability to achieve goals and the importance of cultivating a positive and growth-oriented mind-set. The purpose of this lesson is to provide patients with a deeper understanding of how to set and achieve personal goals, as well as the tools and strategies needed to overcome common obstacles which may arise along the way.  From Head to Heart: The Power of a Healthy Outlook  Clinical staff led group instruction and group discussion with PowerPoint presentation and patient guidebook. To enhance the learning environment the use of posters, models and videos may be added. Patients will be able to recognize and describe the impact of emotions and mood on physical health. They will discover the importance of self-care and explore self-care practices which may work for them. Patients will also learn how to utilize the 4 C's to cultivate a healthier outlook and better manage stress and challenges. The purpose of this lesson is to demonstrate to patients how a healthy outlook is an essential part of maintaining good health, especially as they continue their cardiac rehab journey.  Healthy Sleep for a Healthy Heart Clinical staff led group  instruction and group discussion with PowerPoint presentation and patient guidebook. To enhance the learning environment the use of posters, models and videos may be added. At the conclusion of this workshop, patients will be able to demonstrate knowledge of the importance of sleep to overall health, well-being, and quality of life. They will understand the symptoms of, and treatments for, common sleep disorders. Patients will  also be able to identify daytime and nighttime behaviors which impact sleep, and they will be able to apply these tools to help manage sleep-related challenges. The purpose of this lesson is to provide patients with a general overview of sleep and outline the importance of quality sleep. Patients will learn about a few of the most common sleep disorders. Patients will also be introduced to the concept of "sleep hygiene," and discover ways to self-manage certain sleeping problems through simple daily behavior changes. Finally, the workshop will motivate patients by clarifying the links between quality sleep and their goals of heart-healthy living.   Recognizing and Reducing Stress Clinical staff led group instruction and group discussion with PowerPoint presentation and patient guidebook. To enhance the learning environment the use of posters, models and videos may be added. At the conclusion of this workshop, patients will be able to understand the types of stress reactions, differentiate between acute and chronic stress, and recognize the impact that chronic stress has on their health. They will also be able to apply different coping mechanisms, such as reframing negative self-talk. Patients will have the opportunity to practice a variety of stress management techniques, such as deep abdominal breathing, progressive muscle relaxation, and/or guided imagery.  The purpose of this lesson is to educate patients on the role of stress in their lives and to provide healthy techniques for coping  with it.  Learning Barriers/Preferences:  Learning Barriers/Preferences - 12/20/23 1622       Learning Barriers/Preferences   Learning Barriers Exercise Concerns   Patient has had 2 falls in the past year. But she did do well on her 30 second single leg stand. She achieved 30 seconds   Learning Preferences Written Material;Group Instruction;Computer/Internet;Individual Instruction             Education Topics:  Knowledge Questionnaire Score:  Knowledge Questionnaire Score - 12/20/23 1623       Knowledge Questionnaire Score   Pre Score 23/24             Core Components/Risk Factors/Patient Goals at Admission:  Personal Goals and Risk Factors at Admission - 12/20/23 1531       Core Components/Risk Factors/Patient Goals on Admission    Weight Management Yes    Intervention Weight Management: Develop a combined nutrition and exercise program designed to reach desired caloric intake, while maintaining appropriate intake of nutrient and fiber, sodium and fats, and appropriate energy expenditure required for the weight goal.;Weight Management: Provide education and appropriate resources to help participant work on and attain dietary goals.    Expected Outcomes Short Term: Continue to assess and modify interventions until short term weight is achieved;Long Term: Adherence to nutrition and physical activity/exercise program aimed toward attainment of established weight goal;Understanding recommendations for meals to include 15-35% energy as protein, 25-35% energy from fat, 35-60% energy from carbohydrates, less than 200mg  of dietary cholesterol, 20-35 gm of total fiber daily;Understanding of distribution of calorie intake throughout the day with the consumption of 4-5 meals/snacks    Heart Failure Yes    Intervention Provide a combined exercise and nutrition program that is supplemented with education, support and counseling about heart failure. Directed toward relieving symptoms such  as shortness of breath, decreased exercise tolerance, and extremity edema.    Expected Outcomes Improve functional capacity of life;Short term: Attendance in program 2-3 days a week with increased exercise capacity. Reported lower sodium intake. Reported increased fruit and vegetable intake. Reports medication compliance.;Short term: Daily weights obtained and reported for  increase. Utilizing diuretic protocols set by physician.;Long term: Adoption of self-care skills and reduction of barriers for early signs and symptoms recognition and intervention leading to self-care maintenance.    Hypertension Yes    Intervention Provide education on lifestyle modifcations including regular physical activity/exercise, weight management, moderate sodium restriction and increased consumption of fresh fruit, vegetables, and low fat dairy, alcohol moderation, and smoking cessation.;Monitor prescription use compliance.    Expected Outcomes Long Term: Maintenance of blood pressure at goal levels.;Short Term: Continued assessment and intervention until BP is < 140/42mm HG in hypertensive participants. < 130/27mm HG in hypertensive participants with diabetes, heart failure or chronic kidney disease.    Lipids Yes    Intervention Provide education and support for participant on nutrition & aerobic/resistive exercise along with prescribed medications to achieve LDL 70mg , HDL >40mg .    Expected Outcomes Short Term: Participant states understanding of desired cholesterol values and is compliant with medications prescribed. Participant is following exercise prescription and nutrition guidelines.;Long Term: Cholesterol controlled with medications as prescribed, with individualized exercise RX and with personalized nutrition plan. Value goals: LDL < 70mg , HDL > 40 mg.    Stress Yes    Intervention Offer individual and/or small group education and counseling on adjustment to heart disease, stress management and health-related  lifestyle change. Teach and support self-help strategies.;Refer participants experiencing significant psychosocial distress to appropriate mental health specialists for further evaluation and treatment. When possible, include family members and significant others in education/counseling sessions.    Expected Outcomes Long Term: Emotional wellbeing is indicated by absence of clinically significant psychosocial distress or social isolation.;Short Term: Participant demonstrates changes in health-related behavior, relaxation and other stress management skills, ability to obtain effective social support, and compliance with psychotropic medications if prescribed.    Personal Goal Other Yes    Personal Goal Patient wants to know limits to exercise, increase strength, stamina. To feel safe getting a good workout    Intervention Will continue to monitor pt and progress workloads as tolerated without sign or symptom    Expected Outcomes Pt will achieve her goals and gain strength             Core Components/Risk Factors/Patient Goals Review:   Goals and Risk Factor Review     Row Name 01/15/24 1316             Core Components/Risk Factors/Patient Goals Review   Personal Goals Review Weight Management/Obesity;Stress       Review Kristina Osborne is off to a good start to exercise at cardiac rehab. resting systolic BP's have been in the 90's. Kristina Osborne has been asymptonmatic and encouraged to drink more water as Kristina Osborne is not taking any blood pressure medications at this time       Expected Outcomes Kristina Osborne will continue to participate in cardiac rehab for exercise, nutrition and lifestyle modifications                Core Components/Risk Factors/Patient Goals at Discharge (Final Review):   Goals and Risk Factor Review - 01/15/24 1316       Core Components/Risk Factors/Patient Goals Review   Personal Goals Review Weight Management/Obesity;Stress    Review Kristina Osborne is off to a good start to exercise at cardiac  rehab. resting systolic BP's have been in the 90's. Kristina Osborne has been asymptonmatic and encouraged to drink more water as Kristina Osborne is not taking any blood pressure medications at this time    Expected Outcomes Kristina Osborne will continue to participate in cardiac rehab  for exercise, nutrition and lifestyle modifications             ITP Comments:  ITP Comments     Row Name 12/20/23 1528 12/28/23 1131 01/15/24 1308       ITP Comments Dr. Gaylyn Keas medical director. Introduction to pritikin education/intensive cardiac rehab. Initial orientation packet reviewed with patient. [place] completed first session of exercise in the cardiac rehab program well without symptoms. 30 Day ITP Review. Anahit "Kristina Osborne" is off to a good start ot exercise at cardiac rehab.              Comments: See ITP Comments

## 2024-01-18 ENCOUNTER — Encounter (HOSPITAL_COMMUNITY)
Admission: RE | Admit: 2024-01-18 | Discharge: 2024-01-18 | Disposition: A | Discharge status: Home / Self Care | Source: Ambulatory Visit | Attending: Internal Medicine | Admitting: Internal Medicine

## 2024-01-18 DIAGNOSIS — Z9889 Other specified postprocedural states: Secondary | ICD-10-CM

## 2024-01-23 ENCOUNTER — Telehealth (HOSPITAL_COMMUNITY): Payer: Self-pay

## 2024-01-23 ENCOUNTER — Encounter (HOSPITAL_COMMUNITY): Admission: RE | Admit: 2024-01-23 | Source: Ambulatory Visit

## 2024-01-23 NOTE — Telephone Encounter (Signed)
 Patient c/o for 10:15am class, states she has been around a couple people with strep throat and feels fine but is getting tested today to ensure she does not have it. Patient stated she would call us  back if she tests positive, is aware she must be 48 hour symptom-free to return to class.

## 2024-01-25 ENCOUNTER — Encounter (HOSPITAL_COMMUNITY)
Admission: RE | Admit: 2024-01-25 | Discharge: 2024-01-25 | Disposition: A | Discharge status: Home / Self Care | Source: Ambulatory Visit | Attending: Internal Medicine | Admitting: Internal Medicine

## 2024-01-25 DIAGNOSIS — Z95818 Presence of other cardiac implants and grafts: Secondary | ICD-10-CM | POA: Diagnosis not present

## 2024-01-25 DIAGNOSIS — Z9889 Other specified postprocedural states: Secondary | ICD-10-CM

## 2024-01-28 ENCOUNTER — Encounter (HOSPITAL_COMMUNITY)
Admission: RE | Admit: 2024-01-28 | Discharge: 2024-01-28 | Disposition: A | Discharge status: Home / Self Care | Source: Ambulatory Visit | Attending: Internal Medicine | Admitting: Internal Medicine

## 2024-01-28 ENCOUNTER — Telehealth: Payer: Self-pay | Admitting: *Deleted

## 2024-01-28 ENCOUNTER — Other Ambulatory Visit: Payer: Self-pay | Admitting: *Deleted

## 2024-01-28 DIAGNOSIS — Z87891 Personal history of nicotine dependence: Secondary | ICD-10-CM | POA: Diagnosis not present

## 2024-01-28 DIAGNOSIS — D500 Iron deficiency anemia secondary to blood loss (chronic): Secondary | ICD-10-CM | POA: Diagnosis not present

## 2024-01-28 DIAGNOSIS — Z7964 Long term (current) use of myelosuppressive agent: Secondary | ICD-10-CM | POA: Diagnosis not present

## 2024-01-28 DIAGNOSIS — K5520 Angiodysplasia of colon without hemorrhage: Secondary | ICD-10-CM | POA: Insufficient documentation

## 2024-01-28 DIAGNOSIS — Z79899 Other long term (current) drug therapy: Secondary | ICD-10-CM | POA: Insufficient documentation

## 2024-01-28 DIAGNOSIS — Z7902 Long term (current) use of antithrombotics/antiplatelets: Secondary | ICD-10-CM | POA: Insufficient documentation

## 2024-01-28 DIAGNOSIS — Z9889 Other specified postprocedural states: Secondary | ICD-10-CM | POA: Insufficient documentation

## 2024-01-28 DIAGNOSIS — D473 Essential (hemorrhagic) thrombocythemia: Secondary | ICD-10-CM | POA: Insufficient documentation

## 2024-01-28 DIAGNOSIS — Z95818 Presence of other cardiac implants and grafts: Secondary | ICD-10-CM | POA: Diagnosis present

## 2024-01-28 MED ORDER — HYDROXYUREA 500 MG PO CAPS
1000.0000 mg | ORAL_CAPSULE | Freq: Two times a day (BID) | ORAL | 1 refills | Status: DC
Start: 2024-01-28 — End: 2024-06-12

## 2024-01-28 NOTE — Telephone Encounter (Signed)
 TCT patient regarding refill of her Hydroxyurea . Spoke with her. Advised that her refill has been sent in and she is to continue her current dosing of 2 capsules twice a day on MWF, and 3 capsules on T, Th, Sat/Sun.  Advised that the bottle will say 2 capsules BID every day but she is to continue her med as written above.  Kristina Osborne voiced understanding.

## 2024-01-28 NOTE — Progress Notes (Signed)
 Reviewed home exercise guidelines with Kristina Osborne including endpoints, temperature precautions, target heart rate and rate of perceived exertion. She is currently walking at least 5,000 steps  daily as her mode of home exercise. She wants to be able to exercise without shortness of breath. Kristina Osborne voices understanding of instructions given.  Doree Games, MS, ACSM CEP

## 2024-01-30 ENCOUNTER — Inpatient Hospital Stay

## 2024-01-30 ENCOUNTER — Inpatient Hospital Stay (HOSPITAL_BASED_OUTPATIENT_CLINIC_OR_DEPARTMENT_OTHER): Admitting: Hematology and Oncology

## 2024-01-30 ENCOUNTER — Encounter (HOSPITAL_COMMUNITY)
Admission: RE | Admit: 2024-01-30 | Discharge: 2024-01-30 | Disposition: A | Discharge status: Home / Self Care | Source: Ambulatory Visit | Attending: Internal Medicine | Admitting: Internal Medicine

## 2024-01-30 VITALS — BP 102/61 | HR 83 | Temp 98.3°F | Resp 16

## 2024-01-30 DIAGNOSIS — Z87891 Personal history of nicotine dependence: Secondary | ICD-10-CM | POA: Insufficient documentation

## 2024-01-30 DIAGNOSIS — D500 Iron deficiency anemia secondary to blood loss (chronic): Secondary | ICD-10-CM | POA: Insufficient documentation

## 2024-01-30 DIAGNOSIS — K5520 Angiodysplasia of colon without hemorrhage: Secondary | ICD-10-CM | POA: Insufficient documentation

## 2024-01-30 DIAGNOSIS — Z79899 Other long term (current) drug therapy: Secondary | ICD-10-CM | POA: Insufficient documentation

## 2024-01-30 DIAGNOSIS — D473 Essential (hemorrhagic) thrombocythemia: Secondary | ICD-10-CM

## 2024-01-30 DIAGNOSIS — Z7902 Long term (current) use of antithrombotics/antiplatelets: Secondary | ICD-10-CM | POA: Insufficient documentation

## 2024-01-30 DIAGNOSIS — Z95818 Presence of other cardiac implants and grafts: Secondary | ICD-10-CM | POA: Diagnosis not present

## 2024-01-30 DIAGNOSIS — Z7964 Long term (current) use of myelosuppressive agent: Secondary | ICD-10-CM | POA: Insufficient documentation

## 2024-01-30 DIAGNOSIS — Z9889 Other specified postprocedural states: Secondary | ICD-10-CM

## 2024-01-30 LAB — CMP (CANCER CENTER ONLY)
ALT: 14 U/L (ref 0–44)
AST: 21 U/L (ref 15–41)
Albumin: 4.6 g/dL (ref 3.5–5.0)
Alkaline Phosphatase: 62 U/L (ref 38–126)
Anion gap: 6 (ref 5–15)
BUN: 12 mg/dL (ref 8–23)
CO2: 28 mmol/L (ref 22–32)
Calcium: 9.7 mg/dL (ref 8.9–10.3)
Chloride: 105 mmol/L (ref 98–111)
Creatinine: 0.68 mg/dL (ref 0.44–1.00)
GFR, Estimated: >60 mL/min (ref >60)
Glucose, Bld: 151 mg/dL — ABNORMAL HIGH (ref 70–99)
Potassium: 3.6 mmol/L (ref 3.5–5.1)
Sodium: 139 mmol/L (ref 135–145)
Total Bilirubin: 0.5 mg/dL (ref 0.0–1.2)
Total Protein: 7.1 g/dL (ref 6.5–8.1)

## 2024-01-30 LAB — CBC WITH DIFFERENTIAL (CANCER CENTER ONLY)
Abs Immature Granulocytes: 0.03 10*3/uL (ref 0.00–0.07)
Basophils Absolute: 0 10*3/uL (ref 0.0–0.1)
Basophils Relative: 1 %
Eosinophils Absolute: 0.2 10*3/uL (ref 0.0–0.5)
Eosinophils Relative: 3 %
HCT: 33.8 % — ABNORMAL LOW (ref 36.0–46.0)
Hemoglobin: 12 g/dL (ref 12.0–15.0)
Immature Granulocytes: 1 %
Lymphocytes Relative: 9 %
Lymphs Abs: 0.5 10*3/uL — ABNORMAL LOW (ref 0.7–4.0)
MCH: 44.8 pg — ABNORMAL HIGH (ref 26.0–34.0)
MCHC: 35.5 g/dL (ref 30.0–36.0)
MCV: 126.1 fL — ABNORMAL HIGH (ref 80.0–100.0)
Monocytes Absolute: 0.4 10*3/uL (ref 0.1–1.0)
Monocytes Relative: 6 %
Neutro Abs: 4.4 10*3/uL (ref 1.7–7.7)
Neutrophils Relative %: 80 %
Platelet Count: 305 10*3/uL (ref 150–400)
RBC: 2.68 MIL/uL — ABNORMAL LOW (ref 3.87–5.11)
RDW: 15.3 % (ref 11.5–15.5)
WBC Count: 5.5 10*3/uL (ref 4.0–10.5)
nRBC: 0 % (ref 0.0–0.2)

## 2024-01-30 NOTE — Progress Notes (Signed)
 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 Rosaline Coma  10/26/2021: WBC 6.7, Hgb 13.3, MCV 121.7, Plt 204 03/10/2022: WBC 3.7, Hgb 11.2, MCV 120, Plt 261. Decrease hydroxyurea  to 1000 mg BID due to cytopenias.  06/24/2022-06/27/2022: Admitted with worsening GI bleed. Hydroxyurea  held.  08/01/2022: WBC 4.4, hemoglobin 9.8, MCV 105.1, and platelets of 190 02/26/2023: WBC 7.3, Hgb 12.5, MCV 109.1, Plt 807. Increase hydroxyurea  to 1000 mg daily with 1500 mg every other day 04/27/2023: WBC 4.8, Hgb 12.5, MCV 111.3, Plt 528. Increase hydroxyurea  to 1500 mg daily 06/20/2023: Return dose to hydroxyurea  500 mg twice daily 07/17/2023: increased to1500 mg hydroxyurea  daily (1000 in PM and 500 in AM)   Interval History:  Kristina Osborne 73 y.o. female with medical history significant for essential thrombocytosis who presents for a follow up visit. The patient's last visit was on 12/05/2023. In the interim since the last visit Kristina Osborne has continued 1500 mg hydroxyurea  daily (1000 in PM and 500 in AM).   On exam today Kristina Osborne reports she has been doing very well in the interim since her last visit.  She reports that she is doing cardiac rehab and excelling there.  She notes that her energy level is getting better and better over time.  She has not noticed any trouble with bleeding such as blood in the urine, blood in the stool, or dark stools.  She reports that she continues on Plavix  therapy.  She denies any lightheadedness,  dizziness, or shortness of breath.  She reports her hydroxyurea  medication is not causing any stomach upset or ulcers.  Overall she is willing and able to continue on hydroxyurea  therapy at this time.  A full 10 point ROS is otherwise negative.    MEDICAL HISTORY:  Past Medical History:  Diagnosis Date   ABLA (acute blood loss anemia) 04/16/2022   Congenital malformation of intestinal fixation    Gastritis    Gastroesophageal reflux disease without esophagitis 09/10/2021   IDA (iron  deficiency anemia)    Mitral valve prolapse    Persistent hyperplasia of thymus (HCC)    S/P Thymectomy   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 Lorie Rook   Thrombocytosis     SURGICAL HISTORY: Past Surgical History:  Procedure Laterality Date   BIOPSY  06/26/2022   Procedure: BIOPSY;  Surgeon: Elois Hair, MD;  Location: [facility];  Service: Gastroenterology;;   COLONOSCOPY N/A 04/17/2022   Procedure: COLONOSCOPY;  Surgeon: Nannette Babe, MD;  Location: [facility];  Service: Gastroenterology;  Laterality: N/A;   COLONOSCOPY WITH PROPOFOL  N/A 06/26/2022   Procedure: COLONOSCOPY WITH PROPOFOL ;  Surgeon: Elois Hair, MD;  Location: [facility];  Service: Gastroenterology;  Laterality: N/A;   ENTEROSCOPY N/A 04/17/2022   Procedure: ENTEROSCOPY;  Surgeon: Nannette Babe, MD;  Location: [facility];  Service: Gastroenterology;  Laterality: N/A;   ENTEROSCOPY N/A 06/26/2022   Procedure: ENTEROSCOPY;  Surgeon: Elois Hair, MD;  Location: [facility];  Service: Gastroenterology;  Laterality: N/A;   GIVENS CAPSULE STUDY N/A 06/26/2022   Procedure: GIVENS CAPSULE STUDY;  Surgeon: Elois Hair, MD;  Location: [facility];  Service: Gastroenterology;  Laterality: N/A;   HEMOSTASIS CLIP PLACEMENT  04/17/2022   Procedure: HEMOSTASIS CLIP PLACEMENT;  Surgeon: Nannette Babe, MD;  Location: [facility];  Service: Gastroenterology;;   HEMOSTASIS CLIP PLACEMENT  06/26/2022   Procedure: HEMOSTASIS CLIP PLACEMENT;  Surgeon: Elois Hair, MD;  Location: [facility];  Service: Gastroenterology;;   HOT HEMOSTASIS N/A 04/17/2022   Procedure: HOT HEMOSTASIS (ARGON PLASMA COAGULATION/BICAP);  Surgeon: Nannette Babe, MD;  Location: [facility];  Service: Gastroenterology;  Laterality: N/A;   HOT HEMOSTASIS N/A 06/26/2022   Procedure: HOT HEMOSTASIS (ARGON PLASMA COAGULATION/BICAP);  Surgeon: Elois Hair, MD;  Location: [facility];  Service: Gastroenterology;  Laterality: N/A;   POLYPECTOMY  04/17/2022   Procedure: POLYPECTOMY;  Surgeon: Nannette Babe, MD;  Location: [facility];  Service: Gastroenterology;;   POLYPECTOMY  06/26/2022   Procedure: POLYPECTOMY;  Surgeon: Elois Hair, MD;  Location: [facility];  Service: Gastroenterology;;   RIGHT/LEFT HEART CATH AND CORONARY ANGIOGRAPHY N/A 11/01/2023   Procedure: RIGHT/LEFT HEART CATH AND CORONARY ANGIOGRAPHY;  Surgeon: Wenona Hamilton, MD;  Location: [facility];  Service: Cardiovascular;  Laterality: N/A;   SMALL INTESTINE SURGERY  1994   SUBMUCOSAL TATTOO INJECTION  06/26/2022   Procedure: SUBMUCOSAL TATTOO INJECTION;  Surgeon: Elois Hair, MD;  Location: [facility];  Service: Gastroenterology;;   THYMECTOMY N/A    TRANSCATHETER MITRAL EDGE TO EDGE REPAIR N/A 11/28/2023   Procedure: TRANSCATHETER MITRAL EDGE TO EDGE REPAIR;  Surgeon: Kyra Phy, MD;  Location: [facility];  Service: Cardiovascular;  Laterality: N/A;   TRANSESOPHAGEAL ECHOCARDIOGRAM (CATH LAB) N/A 10/24/2023   Procedure: TRANSESOPHAGEAL ECHOCARDIOGRAM;  Surgeon: Hazle Lites, MD;  Location: [facility];  Service: Cardiovascular;  Laterality: N/A;   TRANSESOPHAGEAL ECHOCARDIOGRAM (CATH LAB) N/A 11/28/2023   Procedure: TRANSESOPHAGEAL ECHOCARDIOGRAM;  Surgeon: Kyra Phy, MD;  Location: [facility];   Service: Cardiovascular;  Laterality: N/A;    SOCIAL HISTORY: Social History   Socioeconomic History   Marital status: Married    Spouse name: Not on file   Number of children: 0   Years of education: Not on file   Highest education level: Not on file  Occupational History   Not on file  Tobacco Use   Smoking status: Former    Current packs/day: 0.00    Types: Cigarettes    Quit date: 2002    Years since quitting: 23.4   Smokeless tobacco: Never   Tobacco comments:    Started smoking in her early 65's.    Smoked 0.5PP at her heaviest.   Vaping Use   Vaping status: Never Used  Substance and Sexual Activity   Alcohol use: Not Currently   Drug use: Never   Sexual activity: Yes    Partners: Male  Other Topics Concern   Not on file  Social History Narrative   Not on file   Social Drivers of Health   Financial Resource Strain: Not on file  Food Insecurity: No Food Insecurity (11/28/2023)   Hunger Vital Sign    Worried About Running Out of Food in the Last Year: Never true    Ran Out of Food in the Last Year: Never true  Transportation Needs: No Transportation Needs (11/28/2023)   PRAPARE - Transportation  Lack of Transportation (Medical): No    Lack of Transportation (Non-Medical): No  Physical Activity: Not on file  Stress: Not on file  Social Connections: Socially Integrated (11/28/2023)   Social Connection and Isolation Panel [NHANES]    Frequency of Communication with Friends and Family: More than three times a week    Frequency of Social Gatherings with Friends and Family: Once a week    Attends Religious Services: More than 4 times per year    Active Member of [employer] or Organizations: Yes    Attends Engineer, structural: More than 4 times per year    Marital Status: Married  Catering manager Violence: Not At Risk (11/28/2023)   Humiliation, Afraid, Rape, and Kick questionnaire    Fear of Current or Ex-Partner: No    Emotionally Abused: No    Physically  Abused: No    Sexually Abused: No    FAMILY HISTORY: Family History  Problem Relation Age of Onset   Heart disease Mother    Valvular heart disease Mother    Healthy Sister    Alcoholism Half-Sister     ALLERGIES:  is allergic to aspirin and sulfa antibiotics.  MEDICATIONS:  Current Outpatient Medications  Medication Sig Dispense Refill   acetaminophen  (TYLENOL ) 500 MG tablet Take 1,000 mg by mouth every 6 (six) hours as needed for headache (pain.).     amoxicillin  (AMOXIL ) 500 MG tablet Take 4 tablets (2,000 mg total) by mouth as directed. 1 hour prior to dental work including cleanings 12 tablet 12   cholestyramine  light (PREVALITE ) 4 g packet Take 2 g by mouth 2 (two) times daily.     clopidogrel  (PLAVIX ) 75 MG tablet Take 1 tablet (75 mg total) by mouth daily with breakfast. 90 tablet 1   famotidine  (PEPCID ) 40 MG tablet TAKE 1 TABLET(40 MG) BY MOUTH DAILY 90 tablet 0   folic acid  (FOLVITE ) 1 MG tablet TAKE 1 TABLET(1 MG) BY MOUTH DAILY 90 tablet 1   furosemide  (LASIX ) 40 MG tablet Take 1 tablet (40 mg total) by mouth as needed for fluid or edema. 90 tablet 3   Glucosamine-Chondroitin (COSAMIN DS PO) Take 1 tablet by mouth daily.     hydroxyurea  (HYDREA ) 500 MG capsule Take 2 capsules (1,000 mg total) by mouth 2 (two) times daily. May take with food to minimize GI side effects. 120 capsule 1   Lycopene 10 MG CAPS Take 10 mg by mouth daily.     sucralfate  (CARAFATE ) 1 g tablet TAKE 1 TABLET(1 GRAM) BY MOUTH FOUR TIMES DAILY AT BEDTIME WITH MEALS (Patient taking differently: Take 1 g by mouth at bedtime.) 360 tablet 1   No current facility-administered medications for this visit.    REVIEW OF SYSTEMS:   Constitutional: ( - ) fevers, ( - )  chills , ( - ) night sweats Eyes: ( - ) blurriness of vision, ( - ) double vision, ( - ) watery eyes Ears, nose, mouth, throat, and face: ( - ) mucositis, ( - ) sore throat Respiratory: ( - ) cough, ( - ) dyspnea, ( - )  wheezes Cardiovascular: ( - ) palpitation, ( - ) chest discomfort, ( - ) lower extremity swelling Gastrointestinal:  ( - ) nausea, ( - ) heartburn, ( - ) change in bowel habits Skin: ( - ) abnormal skin rashes Lymphatics: ( - ) new lymphadenopathy, ( - ) easy bruising Neurological: ( - ) numbness, ( - ) tingling, ( - ) new weaknesses Behavioral/Psych: ( - )  mood change, ( - ) new changes  All other systems were reviewed with the patient and are negative.  PHYSICAL EXAMINATION:  Vitals:   01/30/24 1529  BP: 102/61  Pulse: 83  Resp: 16  Temp: 98.3 F (36.8 C)  SpO2: 100%       GENERAL: well appearing elderly Caucasian female, in NAD  SKIN: skin color, texture, turgor are normal, no rashes or significant lesions EYES: conjunctiva are pink and non-injected, sclera clear LUNGS: clear to auscultation and percussion with normal breathing effort HEART: regular rate & rhythm and no murmurs and no lower extremity edema Musculoskeletal: no cyanosis of digits and no clubbing  PSYCH: alert & oriented x 3, fluent speech NEURO: no focal motor/sensory deficits   LABORATORY DATA:  I have reviewed the data as listed    Latest Ref Rng & Units 01/30/2024    3:09 PM 01/11/2024    1:42 PM 01/04/2024    2:13 PM  CBC  WBC 4.0 - 10.5 K/uL 5.5  1.8  1.6   Hemoglobin 12.0 - 15.0 g/dL 13.0  86.5  78.4   Hematocrit 36.0 - 46.0 % 33.8  32.2  29.4   Platelets 150 - 400 K/uL 305  430  314        Latest Ref Rng & Units 01/30/2024    3:09 PM 01/11/2024    1:42 PM 01/04/2024    2:13 PM  CMP  Glucose 70 - 99 mg/dL 696  90  98   BUN 8 - 23 mg/dL 12  12  17    Creatinine 0.44 - 1.00 mg/dL 2.95  2.84  1.32   Sodium 135 - 145 mmol/L 139  139  138   Potassium 3.5 - 5.1 mmol/L 3.6  4.5  4.0   Chloride 98 - 111 mmol/L 105  105  106   CO2 22 - 32 mmol/L 28  30  29    Calcium  8.9 - 10.3 mg/dL 9.7  9.9  9.5   Total Protein 6.5 - 8.1 g/dL 7.1  7.1  6.6   Total Bilirubin 0.0 - 1.2 mg/dL 0.5  0.4  0.6   Alkaline  Phos 38 - 126 U/L 62  57  57   AST 15 - 41 U/L 21  21  20    ALT 0 - 44 U/L 14  14  13     RADIOGRAPHIC STUDIES: ECHOCARDIOGRAM COMPLETE Result Date: 01/10/2024    ECHOCARDIOGRAM REPORT   Patient Name:   ENRICA CORLISS Date of Exam: 01/10/2024 Medical Rec #:  [id number]         Height:       66.0 in Accession #:    [id number]        Weight:       142.4 lb Date of Birth:  30-Jan-1951         BSA:          1.731 m Patient Age:    72 years          BP:           114/70 mmHg Patient Gender: F                 HR:           80 bpm. Exam Location:  [address] Procedure: 2D Echo, 3D Echo, Cardiac Doppler, Color Doppler and Strain Analysis            (Both Spectral and Color Flow Doppler were utilized during  procedure). Indications:    Z98.890 Status post Mitral Valve Repair-MitraClip  History:        Patient has prior history of Echocardiogram examinations, most                 recent 11/29/2023. Mitral Valve Repair-MitraClip.  Sonographer:    Brigid Canada RDCS Referring Phys: [id number] KATHRYN R THOMPSON IMPRESSIONS  1. Left ventricular ejection fraction, by estimation, is 60 to 65%. Left ventricular ejection fraction by 3D volume is 60 %. The left ventricle has normal function. The left ventricle has no regional wall motion abnormalities. Left ventricular diastolic  parameters are consistent with Grade I diastolic dysfunction (impaired relaxation). The average left ventricular global longitudinal strain is -24.7 %. The global longitudinal strain is normal.  2. Right ventricular systolic function is normal. The right ventricular size is normal. There is normal pulmonary artery systolic pressure. The estimated right ventricular systolic pressure is 25.5 mmHg.  3. Left atrial size was moderately dilated.  4. No definite ASD visualized on this study.  5. Status post mTEER. 2 Mitraclips in the A2/P2 position. Mildly elevated mean gradient 4 mmHg. There is mild-moderate residual mitral regurgitation.  6.  The aortic valve is tricuspid. Aortic valve regurgitation is trivial. No aortic stenosis is present.  7. The inferior vena cava is normal in size with greater than 50% respiratory variability, suggesting right atrial pressure of 3 mmHg. FINDINGS  Left Ventricle: Left ventricular ejection fraction, by estimation, is 60 to 65%. Left ventricular ejection fraction by 3D volume is 60 %. The left ventricle has normal function. The left ventricle has no regional wall motion abnormalities. The average left ventricular global longitudinal strain is -24.7 %. Strain was performed and the global longitudinal strain is normal. The left ventricular internal cavity size was normal in size. There is no left ventricular hypertrophy. Left ventricular diastolic parameters are consistent with Grade I diastolic dysfunction (impaired relaxation). Right Ventricle: The right ventricular size is normal. No increase in right ventricular wall thickness. Right ventricular systolic function is normal. There is normal pulmonary artery systolic pressure. The tricuspid regurgitant velocity is 2.37 m/s, and  with an assumed right atrial pressure of 3 mmHg, the estimated right ventricular systolic pressure is 25.5 mmHg. Left Atrium: Left atrial size was moderately dilated. Right Atrium: Right atrial size was normal in size. Pericardium: There is no evidence of pericardial effusion. Status post mTEER. 2 Mitraclips in the A2/P2 position. Mildly elevated mean gradient 4 mmHg. There is mild-moderate residual mitral regurgitation. The mitral valve has been repaired/replaced. Mild to moderate mitral valve regurgitation. Tricuspid Valve: The tricuspid valve is normal in structure. Tricuspid valve regurgitation is trivial. Aortic Valve: The aortic valve is tricuspid. Aortic valve regurgitation is trivial. No aortic stenosis is present. Pulmonic Valve: The pulmonic valve was normal in structure. Pulmonic valve regurgitation is trivial. Aorta: The aortic root  is normal in size and structure. Venous: The inferior vena cava is normal in size with greater than 50% respiratory variability, suggesting right atrial pressure of 3 mmHg. IAS/Shunts: No definite ASD visualized on this study. Additional Comments: 3D was performed not requiring image post processing on an independent workstation and was normal.  LEFT VENTRICLE PLAX 2D LVIDd:         5.30 cm         Diastology LVIDs:         3.10 cm         LV e' medial:    6.69 cm/s LV PW:  0.80 cm         LV E/e' medial:  22.0 LV IVS:        0.70 cm         LV e' lateral:   9.20 cm/s LVOT diam:     2.20 cm         LV E/e' lateral: 16.0 LV SV:         86 LV SV Index:   50              2D Longitudinal LVOT Area:     3.80 cm        Strain                                2D Strain GLS   -25.2 %                                (A4C):                                2D Strain GLS   -24.5 %                                (A3C):                                2D Strain GLS   -24.4 %                                (A2C):                                2D Strain GLS   -24.7 %                                Avg:                                 3D Volume EF                                LV 3D EF:    Left                                             ventricul                                             ar                                             ejection  fraction                                             by 3D                                             volume is                                             60 %.                                 3D Volume EF:                                3D EF:        60 %                                LV EDV:       120 ml                                LV ESV:       47 ml                                LV SV:        72 ml RIGHT VENTRICLE             IVC RV Basal diam:  3.70 cm     IVC diam: 1.60 cm RV S prime:     15.70 cm/s TAPSE (M-mode): 2.6 cm LEFT  ATRIUM             Index        RIGHT ATRIUM           Index LA diam:        5.40 cm 3.12 cm/m   RA Area:     11.30 cm LA Vol (A2C):   75.1 ml 43.38 ml/m  RA Volume:   25.70 ml  14.85 ml/m LA Vol (A4C):   63.5 ml 36.68 ml/m LA Biplane Vol: 70.5 ml 40.73 ml/m  AORTIC VALVE LVOT Vmax:   117.33 cm/s LVOT Vmean:  77.533 cm/s LVOT VTI:    0.226 m  AORTA Ao Root diam: 3.40 cm Ao Asc diam:  3.30 cm MITRAL VALVE                TRICUSPID VALVE MV Area (PHT): 3.84 cm     TR Peak grad:   22.5 mmHg MV Area VTI:   1.80 cm     TR Vmax:        237.00 cm/s MV Peak grad:  6.8 mmHg MV Mean grad:  4.0 mmHg     SHUNTS MV Vmax:       1.30 m/s     Systemic VTI:  0.23 m MV Vmean:      87.9 cm/s    Systemic Diam: 2.20 cm MV Decel Time: 198 msec MR Peak grad: 75.7 mmHg MR Mean grad: 73.0 mmHg MR Vmax:      435.00 cm/s MR Vmean:     413.0 cm/s MV E velocity: 147.00 cm/s MV A velocity: 150.00 cm/s MV E/A ratio:  0.98 Dalton McleanMD Electronically signed by Archer Bear Signature Date/Time: 01/10/2024/10:47:03 PM    Final     ASSESSMENT & PLAN Kristina Osborne 73 y.o. female with medical history significant for essential thrombocytosis who presents for a follow up visit.   After review of the labs, review of the records, and discussion with the patient the patients findings are most consistent with thrombocytosis, reported due to essential thrombocythemia. We have received the records showing that she has a JAK2 mutation.  Additionally she will require a bone marrow biopsy in order to assure that essential thrombocythemia is the diagnosis.  In the interim we will have the patient continue her hydroxyurea  as previously prescribed by her provider in [place] .  # Essential thrombocythemia, JAK2 Positive  -- At this time we will need to confirm that the patient has essential thrombocythemia.  She will require a bone marrow biopsy to confirm this diagnosis --JAK2 testing received from patient's prior hematologist. Confirmed  JAK2 mutation. Plan:   -- If and when the patient is agreeable we will schedule her for a bone marrow biopsy. --Hydroxyurea  was previously held due to GI bleed and anemia.  Anemia is improving with IV iron  therapy and therefore we can cotinue hydroxyurea  at this time. --previously on 1500 mg hydroxyurea  daily (1000 in PM and 500 in AM) .  Will increase to 1000 mg twice daily as her platelets are currently above target. --I would be very hesitant to transition her to anagrelide in the future given her proclivity for bleeding. -- Labs today show WBC 5.5, hemoglobin 12.0, MCV 126.1, platelets 305 --continue with labs q 4 weeks followed by a clinic visit in 8 weeks.  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  More recent doses were performed on 11/07/2022, 01/18/2023, and most recently 06/26/2023. Feraheme administered on 2/7-2/14/2025.  --Patient has history of GI bleeding. Patient notes no active maroon/red/black stools.  Appreciate assistance of GI in the management of this case. --Labs today as noted above.  -- If the patient were to have recurrent anemia or persistent iron  deficiency will order another dose of IV iron  --continue to follow with above labs.   No orders of the defined types were placed in this encounter.   All questions were answered. The patient knows to call the clinic with any problems, questions or concerns.  A total of more than 30 minutes were spent on this encounter with face-to-face time and non-face-to-face time, including preparing to see the patient, ordering tests and/or medications, counseling the patient and coordination of care as outlined above.   Rogerio Clay, MD Department of Hematology/Oncology [facility] Phone: [phone] Pager: [phone] Email:  [email or web address]  02/03/2024 1:20 PM

## 2024-01-31 LAB — FERRITIN: Ferritin: 86 ng/mL (ref 11–307)

## 2024-02-01 ENCOUNTER — Encounter (HOSPITAL_COMMUNITY)
Admission: RE | Admit: 2024-02-01 | Discharge: 2024-02-01 | Disposition: A | Discharge status: Home / Self Care | Source: Ambulatory Visit | Attending: Internal Medicine | Admitting: Internal Medicine

## 2024-02-01 DIAGNOSIS — Z95818 Presence of other cardiac implants and grafts: Secondary | ICD-10-CM | POA: Diagnosis not present

## 2024-02-01 DIAGNOSIS — Z9889 Other specified postprocedural states: Secondary | ICD-10-CM

## 2024-02-03 ENCOUNTER — Encounter: Payer: Self-pay | Admitting: Hematology and Oncology

## 2024-02-04 ENCOUNTER — Encounter (HOSPITAL_COMMUNITY)
Admission: RE | Admit: 2024-02-04 | Discharge: 2024-02-04 | Disposition: A | Discharge status: Home / Self Care | Source: Ambulatory Visit | Attending: Internal Medicine | Admitting: Internal Medicine

## 2024-02-04 DIAGNOSIS — Z95818 Presence of other cardiac implants and grafts: Secondary | ICD-10-CM | POA: Diagnosis not present

## 2024-02-04 DIAGNOSIS — Z9889 Other specified postprocedural states: Secondary | ICD-10-CM

## 2024-02-06 ENCOUNTER — Telehealth: Payer: Self-pay | Admitting: Internal Medicine

## 2024-02-06 ENCOUNTER — Encounter (HOSPITAL_COMMUNITY)
Admission: RE | Admit: 2024-02-06 | Discharge: 2024-02-06 | Disposition: A | Discharge status: Home / Self Care | Source: Ambulatory Visit | Attending: Internal Medicine | Admitting: Internal Medicine

## 2024-02-06 DIAGNOSIS — Z9889 Other specified postprocedural states: Secondary | ICD-10-CM

## 2024-02-06 LAB — GLUCOSE, CAPILLARY: Glucose-Capillary: 131 mg/dL — ABNORMAL HIGH (ref 70–99)

## 2024-02-06 NOTE — Telephone Encounter (Signed)
 Called patient back about message. Patient was concerned about having some extra fluid on her. Patient stated she felt this on Sunday to Monday, and she took lasix  and things felt better. Patient stated that today she he BP is low and her HR is up. Patient stated she is feeling like she did before her procedure with needing to cough, etc. Patient would like to talk to Jeronimo Moors PA.

## 2024-02-06 NOTE — Telephone Encounter (Signed)
 Per Jeronimo Moors PA, make patient an appointment to see DOD or PA/NP this week. Made patient an appointment with Dr. Stann Earnest, DOD, on Friday. Patient verbalized understanding.

## 2024-02-06 NOTE — Progress Notes (Deleted)
 CARDIOLOGY CONSULT NOTE       Patient ID: Kristina Osborne MRN: [id number] DOB/AGE: September 24, 1950 73 y.o.  Admit date: (Not on file) Referring Physician: Jeronimo Moors PA Primary Physician: Roslyn Coombe, MD Primary Cardiologist: Hilty/[facility]anni Reason for Consultation: Edema, Dizziness , postural hypotension    HPI:  73 y.o. history of GI bleeding, anemia thrombocytosis followed by hematology Hct 33.8 PLT 305 on hydroxyurea . Has been taking lasix  PRN lately for edema. Noted to be postural in office 02/06/24 Sitting BP 124/70 and standing 84/60 mmHg Notes indicate no edema and clear lungs. 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 Most recent echo 01/10/24 no SLD, mild/mod residual MR mean diastolic gradient 4 mmhg EF remains normal 60-65% with normal RV and no pericardial effusion.  Cath 11/01/23 prior to mTEER with no CAD.  Giant V waves consistent with her then severe MR PA pressures 49/16 with mean 31 mmHg and LVEDP 5 mmhg.   ***  ROS All other systems reviewed and negative except as noted above  Past Medical History:  Diagnosis Date   ABLA (acute blood loss anemia) 04/16/2022   Congenital malformation of intestinal fixation    Gastritis    Gastroesophageal reflux disease without esophagitis 09/10/2021   IDA (iron  deficiency anemia)    Mitral valve prolapse    Persistent hyperplasia of thymus (HCC)    S/P Thymectomy   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 Lorie Rook   Thrombocytosis     Family History  Problem Relation Age of Onset   Heart disease Mother    Valvular heart disease Mother    Healthy Sister    Alcoholism Half-Sister     Social History    Socioeconomic History   Marital status: Married    Spouse name: Not on file   Number of children: 0   Years of education: Not on file   Highest education level: Not on file  Occupational History   Not on file  Tobacco Use   Smoking status: Former    Current packs/day: 0.00    Types: Cigarettes    Quit date: 2002    Years since quitting: 23.4   Smokeless tobacco: Never   Tobacco comments:    Started smoking in her early 20's.    Smoked 0.5PP at her heaviest.   Vaping Use   Vaping status: Never Used  Substance and Sexual Activity   Alcohol use: Not Currently   Drug use: Never   Sexual activity: Yes    Partners: Male  Other Topics Concern   Not on file  Social History Narrative   Not on file   Social Drivers of Health   Financial Resource Strain: Not on file  Food Insecurity: No Food Insecurity (11/28/2023)   Hunger Vital Sign    Worried About Running Out of Food in the Last Year: Never true    Ran Out of Food in the Last Year: Never true  Transportation Needs: No Transportation Needs (11/28/2023)   PRAPARE - Administrator, Civil Service (Medical): No    Lack of Transportation (Non-Medical): No  Physical Activity: Not on file  Stress: Not on file  Social Connections:  Socially Integrated (11/28/2023)   Social Connection and Isolation Panel [NHANES]    Frequency of Communication with Friends and Family: More than three times a week    Frequency of Social Gatherings with Friends and Family: Once a week    Attends Religious Services: More than 4 times per year    Active Member of Clubs or Organizations: Yes    Attends Banker Meetings: More than 4 times per year    Marital Status: Married  Catering manager Violence: Not At Risk (11/28/2023)   Humiliation, Afraid, Rape, and Kick questionnaire    Fear of Current or Ex-Partner: No    Emotionally Abused: No    Physically Abused: No    Sexually Abused: No    Past Surgical History:  Procedure  Laterality Date   BIOPSY  06/26/2022   Procedure: BIOPSY;  Surgeon: Elois Hair, MD;  Location: [facility];  Service: Gastroenterology;;   COLONOSCOPY N/A 04/17/2022   Procedure: COLONOSCOPY;  Surgeon: Nannette Babe, MD;  Location: [facility];  Service: Gastroenterology;  Laterality: N/A;   COLONOSCOPY WITH PROPOFOL  N/A 06/26/2022   Procedure: COLONOSCOPY WITH PROPOFOL ;  Surgeon: Elois Hair, MD;  Location: [facility];  Service: Gastroenterology;  Laterality: N/A;   ENTEROSCOPY N/A 04/17/2022   Procedure: ENTEROSCOPY;  Surgeon: Nannette Babe, MD;  Location: [facility];  Service: Gastroenterology;  Laterality: N/A;   ENTEROSCOPY N/A 06/26/2022   Procedure: ENTEROSCOPY;  Surgeon: Elois Hair, MD;  Location: [facility];  Service: Gastroenterology;  Laterality: N/A;   GIVENS CAPSULE STUDY N/A 06/26/2022   Procedure: GIVENS CAPSULE STUDY;  Surgeon: Elois Hair, MD;  Location: [facility];  Service: Gastroenterology;  Laterality: N/A;   HEMOSTASIS CLIP PLACEMENT  04/17/2022   Procedure: HEMOSTASIS CLIP PLACEMENT;  Surgeon: Nannette Babe, MD;  Location: [facility];  Service: Gastroenterology;;   HEMOSTASIS CLIP PLACEMENT  06/26/2022   Procedure: HEMOSTASIS CLIP PLACEMENT;  Surgeon: Elois Hair, MD;  Location: [facility];  Service: Gastroenterology;;   HOT HEMOSTASIS N/A 04/17/2022   Procedure: HOT HEMOSTASIS (ARGON PLASMA COAGULATION/BICAP);  Surgeon: Nannette Babe, MD;  Location: [facility];  Service: Gastroenterology;  Laterality: N/A;   HOT HEMOSTASIS N/A 06/26/2022   Procedure: HOT HEMOSTASIS (ARGON PLASMA COAGULATION/BICAP);  Surgeon: Elois Hair, MD;  Location: [facility];  Service: Gastroenterology;  Laterality: N/A;   POLYPECTOMY  04/17/2022   Procedure: POLYPECTOMY;  Surgeon: Nannette Babe, MD;  Location: [facility];  Service: Gastroenterology;;   POLYPECTOMY  06/26/2022   Procedure: POLYPECTOMY;  Surgeon: Elois Hair, MD;  Location: [facility];  Service: Gastroenterology;;   RIGHT/LEFT HEART CATH AND CORONARY ANGIOGRAPHY N/A 11/01/2023   Procedure: RIGHT/LEFT HEART CATH AND CORONARY ANGIOGRAPHY;  Surgeon: Wenona Hamilton, MD;  Location: [facility];  Service: Cardiovascular;  Laterality: N/A;   SMALL INTESTINE SURGERY  1994   SUBMUCOSAL TATTOO INJECTION  06/26/2022   Procedure: SUBMUCOSAL TATTOO INJECTION;  Surgeon: Elois Hair, MD;  Location: [facility];  Service: Gastroenterology;;   THYMECTOMY N/A    TRANSCATHETER MITRAL EDGE TO EDGE REPAIR N/A 11/28/2023   Procedure: TRANSCATHETER MITRAL EDGE TO EDGE REPAIR;  Surgeon: Kyra Phy, MD;  Location: [facility];  Service: Cardiovascular;  Laterality: N/A;   TRANSESOPHAGEAL ECHOCARDIOGRAM (CATH LAB) N/A 10/24/2023   Procedure: TRANSESOPHAGEAL ECHOCARDIOGRAM;  Surgeon: Hazle Lites, MD;  Location: [facility];  Service: Cardiovascular;  Laterality: N/A;   TRANSESOPHAGEAL ECHOCARDIOGRAM (CATH LAB) N/A 11/28/2023  Procedure: TRANSESOPHAGEAL ECHOCARDIOGRAM;  Surgeon: Kyra Phy, MD;  [facility];  Service: Cardiovascular;  Laterality: N/A;      Current Outpatient Medications:    acetaminophen  (TYLENOL ) 500 MG tablet, Take 1,000 mg by mouth every 6 (six) hours as needed for headache (pain.)., Disp: , Rfl:    amoxicillin  (AMOXIL ) 500 MG tablet, Take 4 tablets (2,000 mg total) by mouth as directed. 1 hour prior to dental work including cleanings, Disp: 12 tablet, Rfl: 12   cholestyramine  light (PREVALITE ) 4 g packet, Take 2 g by mouth 2 (two) times daily., Disp: , Rfl:    clopidogrel  (PLAVIX ) 75 MG tablet, Take 1 tablet (75 mg total) by mouth daily with breakfast., Disp: 90 tablet, Rfl: 1   famotidine  (PEPCID ) 40 MG tablet, TAKE 1 TABLET(40 MG) BY MOUTH DAILY, Disp: 90 tablet, Rfl: 0   folic acid  (FOLVITE ) 1 MG tablet, TAKE 1 TABLET(1 MG) BY MOUTH DAILY, Disp: 90 tablet, Rfl: 1   furosemide   (LASIX ) 40 MG tablet, Take 1 tablet (40 mg total) by mouth as needed for fluid or edema., Disp: 90 tablet, Rfl: 3   Glucosamine-Chondroitin (COSAMIN DS PO), Take 1 tablet by mouth daily. (Patient not taking: Reported on 02/06/2024), Disp: , Rfl:    hydroxyurea  (HYDREA ) 500 MG capsule, Take 2 capsules (1,000 mg total) by mouth 2 (two) times daily. May take with food to minimize GI side effects., Disp: 120 capsule, Rfl: 1   Lycopene 10 MG CAPS, Take 10 mg by mouth daily. (Patient not taking: Reported on 02/06/2024), Disp: , Rfl:    sucralfate  (CARAFATE ) 1 g tablet, TAKE 1 TABLET(1 GRAM) BY MOUTH FOUR TIMES DAILY AT BEDTIME WITH MEALS (Patient taking differently: Take 1 g by mouth at bedtime.), Disp: 360 tablet, Rfl: 1    Physical Exam: There were no vitals taken for this visit.    Elderly female Prior thymectomy No bruit MR murmur Abdomen benign Edema *** Lungs clear   Labs:   Lab Results  Component Value Date   WBC 5.5 01/30/2024   HGB 12.0 01/30/2024   HCT 33.8 (L) 01/30/2024   MCV 126.1 (H) 01/30/2024   PLT 305 01/30/2024   No results for input(s): NA, K, CL, CO2, BUN, CREATININE, CALCIUM , PROT, BILITOT, ALKPHOS, ALT, AST, GLUCOSE in the last 168 hours.  Invalid input(s): LABALBU No results found for: CKTOTAL, CKMB, CKMBINDEX, TROPONINI No results found for: CHOL No results found for: HDL No results found for: LDLCALC No results found for: TRIG No results found for: CHOLHDL No results found for: LDLDIRECT    Radiology: ECHOCARDIOGRAM COMPLETE Result Date: 01/10/2024    ECHOCARDIOGRAM REPORT   Patient Name:   Kristina Osborne Date of Exam: 01/10/2024 Medical Rec #:  [id number]         Height:       66.0 in Accession #:    [id number]        Weight:       142.4 lb Date of Birth:  1950-12-18         BSA:          1.731 m Patient Age:    72 years          BP:           114/70 mmHg Patient Gender: F                 HR:           80 bpm.  Exam Location:  [address] Procedure: 2D Echo, 3D Echo, Cardiac Doppler, Color Doppler and Strain Analysis            (Both Spectral and Color Flow Doppler were utilized during            procedure). Indications:    Z98.890 Status post Mitral Valve Repair-MitraClip  History:        Patient has prior history of Echocardiogram examinations, most                 recent 11/29/2023. Mitral Valve Repair-MitraClip.  Sonographer:    Brigid Canada RDCS Referring Phys: [id number] KATHRYN R THOMPSON IMPRESSIONS  1. Left ventricular ejection fraction, by estimation, is 60 to 65%. Left ventricular ejection fraction by 3D volume is 60 %. The left ventricle has normal function. The left ventricle has no regional wall motion abnormalities. Left ventricular diastolic  parameters are consistent with Grade I diastolic dysfunction (impaired relaxation). The average left ventricular global longitudinal strain is -24.7 %. The global longitudinal strain is normal.  2. Right ventricular systolic function is normal. The right ventricular size is normal. There is normal pulmonary artery systolic pressure. The estimated right ventricular systolic pressure is 25.5 mmHg.  3. Left atrial size was moderately dilated.  4. No definite ASD visualized on this study.  5. Status post mTEER. 2 Mitraclips in the A2/P2 position. Mildly elevated mean gradient 4 mmHg. There is mild-moderate residual mitral regurgitation.  6. The aortic valve is tricuspid. Aortic valve regurgitation is trivial. No aortic stenosis is present.  7. The inferior vena cava is normal in size with greater than 50% respiratory variability, suggesting right atrial pressure of 3 mmHg. FINDINGS  Left Ventricle: Left ventricular ejection fraction, by estimation, is 60 to 65%. Left ventricular ejection fraction by 3D volume is 60 %. The left ventricle has normal function. The left ventricle has no regional wall motion abnormalities. The average left ventricular global longitudinal  strain is -24.7 %. Strain was performed and the global longitudinal strain is normal. The left ventricular internal cavity size was normal in size. There is no left ventricular hypertrophy. Left ventricular diastolic parameters are consistent with Grade I diastolic dysfunction (impaired relaxation). Right Ventricle: The right ventricular size is normal. No increase in right ventricular wall thickness. Right ventricular systolic function is normal. There is normal pulmonary artery systolic pressure. The tricuspid regurgitant velocity is 2.37 m/s, and  with an assumed right atrial pressure of 3 mmHg, the estimated right ventricular systolic pressure is 25.5 mmHg. Left Atrium: Left atrial size was moderately dilated. Right Atrium: Right atrial size was normal in size. Pericardium: There is no evidence of pericardial effusion. Status post mTEER. 2 Mitraclips in the A2/P2 position. Mildly elevated mean gradient 4 mmHg. There is mild-moderate residual mitral regurgitation. The mitral valve has been repaired/replaced. Mild to moderate mitral valve regurgitation. Tricuspid Valve: The tricuspid valve is normal in structure. Tricuspid valve regurgitation is trivial. Aortic Valve: The aortic valve is tricuspid. Aortic valve regurgitation is trivial. No aortic stenosis is present. Pulmonic Valve: The pulmonic valve was normal in structure. Pulmonic valve regurgitation is trivial. Aorta: The aortic root is normal in size and structure. Venous: The inferior vena cava is normal in size with greater than 50% respiratory variability, suggesting right atrial pressure of 3 mmHg. IAS/Shunts: No definite ASD visualized on this study. Additional Comments: 3D was performed not requiring image post processing on an independent workstation and was normal.  LEFT VENTRICLE PLAX 2D LVIDd:  5.30 cm         Diastology LVIDs:         3.10 cm         LV e' medial:    6.69 cm/s LV PW:         0.80 cm         LV E/e' medial:  22.0 LV IVS:         0.70 cm         LV e' lateral:   9.20 cm/s LVOT diam:     2.20 cm         LV E/e' lateral: 16.0 LV SV:         86 LV SV Index:   50              2D Longitudinal LVOT Area:     3.80 cm        Strain                                2D Strain GLS   -25.2 %                                (A4C):                                2D Strain GLS   -24.5 %                                (A3C):                                2D Strain GLS   -24.4 %                                (A2C):                                2D Strain GLS   -24.7 %                                Avg:                                 3D Volume EF                                LV 3D EF:    Left                                             ventricul  ar                                             ejection                                             fraction                                             by 3D                                             volume is                                             60 %.                                 3D Volume EF:                                3D EF:        60 %                                LV EDV:       120 ml                                LV ESV:       47 ml                                LV SV:        72 ml RIGHT VENTRICLE             IVC RV Basal diam:  3.70 cm     IVC diam: 1.60 cm RV S prime:     15.70 cm/s TAPSE (M-mode): 2.6 cm LEFT ATRIUM             Index        RIGHT ATRIUM           Index LA diam:        5.40 cm 3.12 cm/m   RA Area:     11.30 cm LA Vol (A2C):   75.1 ml 43.38 ml/m  RA Volume:   25.70 ml  14.85 ml/m LA Vol (A4C):   63.5 ml 36.68 ml/m LA Biplane Vol: 70.5 ml 40.73 ml/m  AORTIC VALVE LVOT Vmax:   117.33 cm/s LVOT Vmean:  77.533 cm/s LVOT VTI:    0.226 m  AORTA Ao Root diam: 3.40 cm Ao Asc diam:  3.30 cm MITRAL  VALVE                TRICUSPID VALVE MV Area (PHT): 3.84 cm     TR Peak grad:   22.5 mmHg MV Area VTI:   1.80 cm     TR Vmax:         237.00 cm/s MV Peak grad:  6.8 mmHg MV Mean grad:  4.0 mmHg     SHUNTS MV Vmax:       1.30 m/s     Systemic VTI:  0.23 m MV Vmean:      87.9 cm/s    Systemic Diam: 2.20 cm MV Decel Time: 198 msec MR Peak grad: 75.7 mmHg MR Mean grad: 73.0 mmHg MR Vmax:      435.00 cm/s MR Vmean:     413.0 cm/s MV E velocity: 147.00 cm/s MV A velocity: 150.00 cm/s MV E/A ratio:  0.98 Dalton McleanMD Electronically signed by Archer Bear Signature Date/Time: 01/10/2024/10:47:03 PM    Final     EKG: 11/29/23 SR rate 55 normal    ASSESSMENT AND PLAN:   Postural Hypotension: in setting of lasix  use post mTEER Not azotemic on labs 01/30/24 with Hct 33.8.  *** mTeer:  11/28/23 with two clips XTW/NTW recent TTE 01/10/24 with no SLD, mild/mod MR and normal EF with no effusion On plavix  indefinitely Thrombocytosis :  stable on hydroxyurea  count 305 F/U [place]  Anemia: with history of AVM;s Hct stable   ***  F/U Dr Oneta Bilberry and Hilty 4 weeks  Signed: Janelle Mediate 02/06/2024, 5:24 PM

## 2024-02-06 NOTE — Telephone Encounter (Signed)
 Pt requesting a c/b from the nurse she would like to ask a few questions about fluid.please advise

## 2024-02-06 NOTE — Progress Notes (Addendum)
 Patient reports feeling weak. Kristina Osborne says that she took a furosemide  on Sunday night for increased swelling. Weight today 64.5 kg. Sitting blood pressure 124/70 heart rate 88. Standing blood pressure 84/60 heart rate 91. Oxygen saturation 98% on room air. Upon assessment lung fields clear upon auscultation. No peripheral edema. Kristina Osborne said that she ate a 1/2 of a banana and an english muffin for breakfast. Telemetry rhythm Sinus 76. Kristina Osborne said that she put a call into Kristina Osborne about her symptoms this morning. Patient was given water to drink. Recheck BP 86/60 in the lobby. Kristina Osborne from Kristina Osborne office called the patient to speak with her over the phone. I updated Kristina Osborne about Kristina Osborne's vital signs. Patient was taken to the treatment room and placed on Zoll feet elevated. Onsite provider Kristina Duncan NP notified. Patient given more water to drink will recheck orthostatic BP's. Medications reviewed. Taking as prescribed. Patient did not exercise today due to symptoms. 1059 Recheck sitting BP 114/60 hr 74          Standing BP 114/60 heart rate 77 Patient denies feeling lightheaded but does still feel a little weak. Onsite provider Kristina Duncan NP instructed Kristina Osborne to increase her protein intake. Kristina Osborne was also instructed to make sure she hydrates adequately before coming to exercise. Patient states understanding.   Patient left cardiac rehab without further symptoms and will return to exercise on Friday. Patient was transported to the car via transport chair with her husband by her side.Kristina Antonio RN BSN

## 2024-02-06 NOTE — Telephone Encounter (Signed)
 Called patient to tell her about Kristina Moors PA advisement. Patient is in rehab right now and her BP when standing is 86/60  and HR 70's. Patient is feeling very dizzy and lightheaded when up and moving around. Will let Katie know.

## 2024-02-07 ENCOUNTER — Encounter (HOSPITAL_BASED_OUTPATIENT_CLINIC_OR_DEPARTMENT_OTHER): Payer: Self-pay | Admitting: Emergency Medicine

## 2024-02-07 ENCOUNTER — Other Ambulatory Visit: Payer: Self-pay

## 2024-02-07 ENCOUNTER — Emergency Department (HOSPITAL_BASED_OUTPATIENT_CLINIC_OR_DEPARTMENT_OTHER): Admitting: Radiology

## 2024-02-07 ENCOUNTER — Emergency Department (HOSPITAL_BASED_OUTPATIENT_CLINIC_OR_DEPARTMENT_OTHER)
Admission: EM | Admit: 2024-02-07 | Discharge: 2024-02-07 | Disposition: A | Discharge status: Home / Self Care | Attending: Emergency Medicine | Admitting: Emergency Medicine

## 2024-02-07 DIAGNOSIS — Z7902 Long term (current) use of antithrombotics/antiplatelets: Secondary | ICD-10-CM | POA: Insufficient documentation

## 2024-02-07 DIAGNOSIS — I509 Heart failure, unspecified: Secondary | ICD-10-CM | POA: Insufficient documentation

## 2024-02-07 DIAGNOSIS — E860 Dehydration: Secondary | ICD-10-CM | POA: Insufficient documentation

## 2024-02-07 DIAGNOSIS — R531 Weakness: Secondary | ICD-10-CM

## 2024-02-07 DIAGNOSIS — Z79899 Other long term (current) drug therapy: Secondary | ICD-10-CM | POA: Diagnosis not present

## 2024-02-07 LAB — CBC
HCT: 35.3 % — ABNORMAL LOW (ref 36.0–46.0)
Hemoglobin: 12.5 g/dL (ref 12.0–15.0)
MCH: 46 pg — ABNORMAL HIGH (ref 26.0–34.0)
MCHC: 35.4 g/dL (ref 30.0–36.0)
MCV: 129.8 fL — ABNORMAL HIGH (ref 80.0–100.0)
Platelets: 168 10*3/uL (ref 150–400)
RBC: 2.72 MIL/uL — ABNORMAL LOW (ref 3.87–5.11)
RDW: 14.8 % (ref 11.5–15.5)
WBC: 3.5 10*3/uL — ABNORMAL LOW (ref 4.0–10.5)
nRBC: 0 % (ref 0.0–0.2)

## 2024-02-07 LAB — HEPATIC FUNCTION PANEL
ALT: 14 U/L (ref 0–44)
AST: 25 U/L (ref 15–41)
Albumin: 4.6 g/dL (ref 3.5–5.0)
Alkaline Phosphatase: 72 U/L (ref 38–126)
Bilirubin, Direct: 0.2 mg/dL (ref 0.0–0.2)
Indirect Bilirubin: 0.3 mg/dL (ref 0.3–0.9)
Total Bilirubin: 0.6 mg/dL (ref 0.0–1.2)
Total Protein: 6.9 g/dL (ref 6.5–8.1)

## 2024-02-07 LAB — BASIC METABOLIC PANEL WITH GFR
Anion gap: 13 (ref 5–15)
BUN: 8 mg/dL (ref 8–23)
CO2: 21 mmol/L — ABNORMAL LOW (ref 22–32)
Calcium: 10 mg/dL (ref 8.9–10.3)
Chloride: 104 mmol/L (ref 98–111)
Creatinine, Ser: 0.69 mg/dL (ref 0.44–1.00)
GFR, Estimated: >60 mL/min (ref >60)
Glucose, Bld: 94 mg/dL (ref 70–99)
Potassium: 4 mmol/L (ref 3.5–5.1)
Sodium: 137 mmol/L (ref 135–145)

## 2024-02-07 LAB — TROPONIN T, HIGH SENSITIVITY
Troponin T High Sensitivity: <15 ng/L (ref <19)
Troponin T High Sensitivity: <15 ng/L (ref <19)

## 2024-02-07 LAB — LIPASE, BLOOD: Lipase: 41 U/L (ref 11–51)

## 2024-02-07 MED ORDER — DICYCLOMINE HCL 20 MG PO TABS: 20.0000 mg | ORAL_TABLET | Freq: Two times a day (BID) | ORAL | 0 refills | Status: DC

## 2024-02-07 MED ORDER — SODIUM CHLORIDE 0.9 % IV BOLUS
500.0000 mL | Freq: Once | INTRAVENOUS | Status: AC
  Administered 2024-02-07: 500 mL via INTRAVENOUS

## 2024-02-07 NOTE — ED Triage Notes (Signed)
 Pt caox4 c/o weakness that started on Monday, was told she had orthostatic hypotension at cardiac rehab. Pt also reports diarrhea since Tuesday with increased weakness and SOB.

## 2024-02-07 NOTE — Discharge Instructions (Signed)
 As we discussed, I have sent some Bentyl  to your pharmacy.  You can pick this up at your early convenience at the [facility].  I would like for you to follow-up with your primary care doctor for further evaluation.  Continue drinking plenty of fluids and getting plenty of rest.  I do not think that you need to take the Lasix  at this time as you are not having any swelling.  Continue to watch your salt intake.  You may return to the emergency department for any worsening symptoms.

## 2024-02-07 NOTE — ED Notes (Signed)
 Orthostatic vitals completed, EDP notified of results. Pt endorsed some lightheadedness upon standing but reports this is baseline for her and resolves quickly.

## 2024-02-07 NOTE — ED Provider Notes (Signed)
 [place] EMERGENCY DEPARTMENT AT [facility] Provider Note   CSN: [id number] Arrival date & time: 02/07/24  1009     Patient presents with: No chief complaint on file.   Kristina Osborne is a 73 y.o. female patient with history of mitral valve prolapse, GERD, CHF status post mitral valve clip on Plavix  who presents to the emergency department today for further evaluation of shortness of breath upon exertion and generalized weakness.  Patient states that she has been taking care of her husband who recently had a stroke and they have been eating out a little more than normal she has been too tired to cook.  She states that over the weekend she had [employer] back-to-back and had their chili which is very high in sodium content.  She also the following day had a piece of pizza and was worried about her sodium intake so she prophylactically took 20 mg of Lasix .  From Sunday to Monday she lost 2 pounds in fluid.  On Monday at cardiac rehab, she was orthostatic positive with a systolic blood pressure in the 80s.  She was sent home and told to increase her protein intake.  She started eating some things that she normally does not have and Tuesday night started having very watery diarrhea.  She denies any leg swelling, chest pain, syncope, fever, chills, cough, congestion, nausea, vomiting.  Of note, I reviewed her most recent echocardiogram was performed approximately 3 weeks ago.  She had a normal ejection fraction of 60 to 65%.   HPI     Prior to Admission medications   Medication Sig Start Date End Date Taking? Authorizing Provider  dicyclomine  (BENTYL ) 20 MG tablet Take 1 tablet (20 mg total) by mouth 2 (two) times daily. 02/07/24  Yes Jamal Mays, Darryll Raju M, PA-C  acetaminophen  (TYLENOL ) 500 MG tablet Take 1,000 mg by mouth every 6 (six) hours as needed for headache (pain.).    [provider]  amoxicillin  (AMOXIL ) 500 MG tablet Take 4 tablets (2,000 mg total) by mouth as directed.  1 hour prior to dental work including cleanings 12/03/23   Ardia Kraft, PA-C  cholestyramine  light (PREVALITE ) 4 g packet Take 2 g by mouth 2 (two) times daily.    [provider]  clopidogrel  (PLAVIX ) 75 MG tablet Take 1 tablet (75 mg total) by mouth daily with breakfast. 11/30/23   Ardia Kraft, PA-C  famotidine  (PEPCID ) 40 MG tablet TAKE 1 TABLET(40 MG) BY MOUTH DAILY 11/21/23   Dorsey, John T IV, MD  folic acid  (FOLVITE ) 1 MG tablet TAKE 1 TABLET(1 MG) BY MOUTH DAILY 11/21/23   Dorsey, John T IV, MD  furosemide  (LASIX ) 40 MG tablet Take 1 tablet (40 mg total) by mouth as needed for fluid or edema. 12/03/23 03/02/24  Ardia Kraft, PA-C  Glucosamine-Chondroitin (COSAMIN DS PO) Take 1 tablet by mouth daily. Patient not taking: Reported on 02/06/2024    [provider]  hydroxyurea  (HYDREA ) 500 MG capsule Take 2 capsules (1,000 mg total) by mouth 2 (two) times daily. May take with food to minimize GI side effects. 01/28/24   Dorsey, John T IV, MD  Lycopene 10 MG CAPS Take 10 mg by mouth daily. Patient not taking: Reported on 02/06/2024    [provider]  sucralfate  (CARAFATE ) 1 g tablet TAKE 1 TABLET(1 GRAM) BY MOUTH FOUR TIMES DAILY AT BEDTIME WITH MEALS Patient taking differently: Take 1 g by mouth at bedtime. 08/31/23   Dorsey, John T IV, MD  Allergies: Aspirin and Sulfa antibiotics    Review of Systems  All other systems reviewed and are negative.   Updated Vital Signs BP (!) 109/56   Pulse 68   Temp 98.2 F (36.8 C) (Oral)   Resp 20   Ht 5' 6 (1.676 m)   Wt 64.4 kg   LMP  (LMP Unknown)   SpO2 100%   BMI 22.92 kg/m   Physical Exam Vitals and nursing note reviewed.  Constitutional:      General: She is not in acute distress.    Appearance: Normal appearance.  HENT:     Head: Normocephalic and atraumatic.   Eyes:     General:        Right eye: No discharge.        Left eye: No discharge.    Cardiovascular:     Comments: Regular  rate and rhythm.  S1/S2 are distinct without any evidence of murmur, rubs, or gallops.  Radial pulses are 2+ bilaterally.  Dorsalis pedis pulses are 2+ bilaterally.  No evidence of pedal edema. Pulmonary:     Comments: Clear to auscultation bilaterally.  Normal effort.  No respiratory distress.  No evidence of wheezes, rales, or rhonchi heard throughout. Abdominal:     General: Abdomen is flat. Bowel sounds are normal. There is no distension.     Tenderness: There is no abdominal tenderness. There is no guarding or rebound.   Musculoskeletal:        General: Normal range of motion.     Cervical back: Neck supple.   Skin:    General: Skin is warm and dry.     Findings: No rash.   Neurological:     General: No focal deficit present.     Mental Status: She is alert.   Psychiatric:        Mood and Affect: Mood normal.        Behavior: Behavior normal.     (all labs ordered are listed, but only abnormal results are displayed) Labs Reviewed  BASIC METABOLIC PANEL WITH GFR - Abnormal; Notable for the following components:      Result Value   CO2 21 (*)    All other components within normal limits  CBC - Abnormal; Notable for the following components:   WBC 3.5 (*)    RBC 2.72 (*)    HCT 35.3 (*)    MCV 129.8 (*)    MCH 46.0 (*)    All other components within normal limits  HEPATIC FUNCTION PANEL  LIPASE, BLOOD  TROPONIN T, HIGH SENSITIVITY  TROPONIN T, HIGH SENSITIVITY    EKG: EKG Interpretation Date/Time:  Thursday February 07 2024 10:15:55 EDT Ventricular Rate:  98 PR Interval:  138 QRS Duration:  93 QT Interval:  321 QTC Calculation: 410 R Axis:   47  Text Interpretation: Sinus rhythm Left atrial enlargement RSR' in V1 or V2, probably normal variant Nonspecific repol abnormality, diffuse leads Since last tracing rate faster Confirmed by Sueellen Emery [phone]) on 02/07/2024 10:19:47 AM  Radiology: Lenell Query Chest 2 View Result Date: 02/07/2024 CLINICAL DATA:  Shortness of  breath EXAM: CHEST - 2 VIEW COMPARISON:  11/28/2023 FINDINGS: Lungs are clear. Heart size and mediastinal contours are within normal limits. Interval placement of 2 mitral valve clips. Heavy mitral annulus calcification. Aortic Atherosclerosis (ICD10-170.0). No effusion. Sternotomy wires. IMPRESSION: 1. No acute cardiopulmonary disease. 2. Interval mitral clip placement. Electronically Signed   By: Nicoletta Barrier M.D.   On: 02/07/2024 11:19  Procedures   Medications Ordered in the ED  sodium chloride  0.9 % bolus 500 mL (0 mLs Intravenous Stopped 02/07/24 1305)    Clinical Course as of 02/07/24 1404  Thu Feb 07, 2024  1239 On repeat evaluation, patient states she is feeling a little better after fluids.  She does feel about the same when she gets up and walks around. [CF]  1359 On reevaluation, patient states she is feeling much better.  Orthostatics negative.  She has been having some abdominal cramping and I will prescribe her some Bentyl  to go home with. [CF]  1359 CBC(!) No evidence of anemia. [CF]  1359 Basic metabolic panel(!) Negative. [CF]  1359 Troponin T, High Sensitivity Initial and delta troponin are normal. [CF]    Clinical Course User Index [CF] Darletta Ehrich, PA-C   Medical Decision Making Wakisha Alberts is a 72 y.o. female patient who presents to the emergency department today for further evaluation of shortness of breath and generalized weakness.  I do favor hypovolemia as the cause given the patient's watery diarrhea and 2 pound fluid loss.  However, given the patient's cardiac history will get troponins, EKG, and chest x-ray.  Given her diarrhea I will also look at some hepatic function and electrolyte abnormalities. Initial blood pressure is soft with a systolic blood pressure of 100.  As highlighted in the ED course, patient is feeling better after fluids.  Will send Bentyl  into the pharmacy for abdominal cramping.  Will have her follow-up with her primary care  doctor.  Strict return precautions were discussed.  She is safe for discharge.  Amount and/or Complexity of Data Reviewed Labs: ordered. Decision-making details documented in ED Course. Radiology: ordered.  Risk Prescription drug management.     Final diagnoses:  Generalized weakness  Dehydration    ED Discharge Orders          Ordered    dicyclomine  (BENTYL ) 20 MG tablet  2 times daily        02/07/24 1400               Angelyn Kennel [place], [place] 02/07/24 1404    Sueellen Emery, MD 02/07/24 1420

## 2024-02-08 ENCOUNTER — Ambulatory Visit: Attending: Cardiovascular Disease | Admitting: Cardiovascular Disease

## 2024-02-08 ENCOUNTER — Encounter (HOSPITAL_COMMUNITY): Admission: RE | Admit: 2024-02-08 | Source: Ambulatory Visit

## 2024-02-08 ENCOUNTER — Telehealth (HOSPITAL_COMMUNITY): Payer: Self-pay | Admitting: *Deleted

## 2024-02-08 NOTE — Telephone Encounter (Signed)
 Called to check on patient did not return to exercise today.Monte Antonio RN BSN

## 2024-02-11 ENCOUNTER — Encounter (HOSPITAL_COMMUNITY)
Admission: RE | Admit: 2024-02-11 | Discharge: 2024-02-11 | Disposition: A | Discharge status: Home / Self Care | Source: Ambulatory Visit | Attending: Internal Medicine | Admitting: Internal Medicine

## 2024-02-11 ENCOUNTER — Encounter: Payer: Self-pay | Admitting: Cardiovascular Disease

## 2024-02-11 DIAGNOSIS — Z95818 Presence of other cardiac implants and grafts: Secondary | ICD-10-CM | POA: Diagnosis not present

## 2024-02-11 DIAGNOSIS — Z9889 Other specified postprocedural states: Secondary | ICD-10-CM

## 2024-02-11 NOTE — Progress Notes (Signed)
 sitting BP 94/68 standing BP 96/60 hr 75. Discussed with onsite provider Morey Ar NP. Per Bernardo Bridgeman patient is okay to proceed with exercise. Will continue to monitor the patient throughout  the program. Monte Antonio RN BSN

## 2024-02-12 NOTE — Progress Notes (Signed)
 Cardiac Individual Treatment Plan  Patient Details  Name: Kristina Osborne MRN: [id number] Date of Birth: Jun 25, 1951 Referring Provider:   Flowsheet Row INTENSIVE CARDIAC REHAB ORIENT from 12/20/2023 in [facility]  Referring Provider Dinah Franco, MD    Initial Encounter Date:  Flowsheet Row INTENSIVE CARDIAC REHAB ORIENT from 12/20/2023 in [facility]  Date 12/20/23    Visit Diagnosis: 11/28/23 mitraclip  Patient's Home Medications on Admission:  Current Outpatient Medications:    acetaminophen  (TYLENOL ) 500 MG tablet, Take 1,000 mg by mouth every 6 (six) hours as needed for headache (pain.)., Disp: , Rfl:    amoxicillin  (AMOXIL ) 500 MG tablet, Take 4 tablets (2,000 mg total) by mouth as directed. 1 hour prior to dental work including cleanings, Disp: 12 tablet, Rfl: 12   cholestyramine  light (PREVALITE ) 4 g packet, Take 2 g by mouth 2 (two) times daily., Disp: , Rfl:    clopidogrel  (PLAVIX ) 75 MG tablet, Take 1 tablet (75 mg total) by mouth daily with breakfast., Disp: 90 tablet, Rfl: 1   dicyclomine  (BENTYL ) 20 MG tablet, Take 1 tablet (20 mg total) by mouth 2 (two) times daily., Disp: 20 tablet, Rfl: 0   famotidine  (PEPCID ) 40 MG tablet, TAKE 1 TABLET(40 MG) BY MOUTH DAILY, Disp: 90 tablet, Rfl: 0   folic acid  (FOLVITE ) 1 MG tablet, TAKE 1 TABLET(1 MG) BY MOUTH DAILY, Disp: 90 tablet, Rfl: 1   furosemide  (LASIX ) 40 MG tablet, Take 1 tablet (40 mg total) by mouth as needed for fluid or edema., Disp: 90 tablet, Rfl: 3   Glucosamine-Chondroitin (COSAMIN DS PO), Take 1 tablet by mouth daily. (Patient not taking: Reported on 02/06/2024), Disp: , Rfl:    hydroxyurea  (HYDREA ) 500 MG capsule, Take 2 capsules (1,000 mg total) by mouth 2 (two) times daily. May take with food to minimize GI side effects., Disp: 120 capsule, Rfl: 1   Lycopene 10 MG CAPS, Take 10 mg by mouth daily.  (Patient not taking: Reported on 02/06/2024), Disp: , Rfl:    sucralfate  (CARAFATE ) 1 g tablet, TAKE 1 TABLET(1 GRAM) BY MOUTH FOUR TIMES DAILY AT BEDTIME WITH MEALS (Patient taking differently: Take 1 g by mouth at bedtime.), Disp: 360 tablet, Rfl: 1  Past Medical History: Past Medical History:  Diagnosis Date   ABLA (acute blood loss anemia) 04/16/2022   Congenital malformation of intestinal fixation    Gastritis    Gastroesophageal reflux disease without esophagitis 09/10/2021   IDA (iron  deficiency anemia)    Mitral valve prolapse    Persistent hyperplasia of thymus (HCC)    S/P Thymectomy   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 Lorie Rook   Thrombocytosis     Tobacco Use: Social History   Tobacco Use  Smoking Status Former   Current packs/day: 0.00   Types: Cigarettes   Quit date: 2002   Years since quitting: 23.4  Smokeless Tobacco Never  Tobacco Comments   Started smoking in her early 20's.   Smoked 0.5PP at her heaviest.     Labs: Review Flowsheet       Latest Ref Rng & Units 04/16/2022 10/24/2023 11/01/2023  Labs for ITP Cardiac and Pulmonary Rehab  Hemoglobin A1c 4.8 - 5.6 % 4.4  - -  PH, Arterial 7.35 - 7.45 - - 7.401   PCO2 arterial 32 -  48 mmHg - - 38.9   Bicarbonate 20.0 - 28.0 mmol/L - - 25.3  24.2   TCO2 22 - 32 mmol/L - 20  27  25    O2 Saturation % - - 73  92     Details       Multiple values from one day are sorted in reverse-chronological order         Capillary Blood Glucose: Lab Results  Component Value Date   GLUCAP 131 (H) 02/06/2024     Exercise Target Goals: Exercise Program Goal: Individual exercise prescription set using results from initial 6 min walk test and THRR while considering  patient's activity barriers and safety.   Exercise Prescription Goal: Initial exercise prescription builds to 30-45 minutes a day of aerobic activity, 2-3 days  per week.  Home exercise guidelines will be given to patient during program as part of exercise prescription that the participant will acknowledge.  Activity Barriers & Risk Stratification:  Activity Barriers & Cardiac Risk Stratification - 12/20/23 1605       Activity Barriers & Cardiac Risk Stratification   Activity Barriers Deconditioning;History of Falls    Cardiac Risk Stratification High          6 Minute Walk:  6 Minute Walk     Row Name 12/20/23 1601         6 Minute Walk   Phase Initial     Distance 1745 feet     Walk Time 6 minutes     # of Rest Breaks 0     MPH 3.3     METS 3.64     RPE 7     Perceived Dyspnea  0     VO2 Peak 12.75     Symptoms Yes (comment)     Comments Post walk test patient states she felt swimmy headed, patient denigns SOB, light headed feeling or visual changes. Resolved within 30 seconds. Orthostatics seated 98/63 standing 93/63 asymptomatic.     Resting HR 75 bpm     Resting BP 104/60     Resting Oxygen Saturation  98 %     Exercise Oxygen Saturation  during 6 min walk 100 %     Max Ex. HR 88 bpm     Max Ex. BP 116/62     2 Minute Post BP 100/60        Oxygen Initial Assessment:   Oxygen Re-Evaluation:   Oxygen Discharge (Final Oxygen Re-Evaluation):   Initial Exercise Prescription:  Initial Exercise Prescription - 12/20/23 1500       Date of Initial Exercise RX and Referring Provider   Date 12/20/23    Referring Provider Dinah Franco, MD    Expected Discharge Date 03/12/24      NuStep   Level 1    SPM 80    Minutes 15    METs 1.8      Recumbant Elliptical   Level 1    RPM 40    Watts 60    Minutes 15    METs 2.1      Prescription Details   Frequency (times per week) 3    Duration Progress to 30 minutes of continuous aerobic without signs/symptoms of physical distress      Intensity   THRR 40-80% of Max Heartrate 60-119    Ratings of Perceived Exertion 11-13    Perceived Dyspnea 0-4       Progression   Progression Continue progressive overload as per policy without signs/symptoms  or physical distress.      Resistance Training   Training Prescription Yes    Reps 10-15          Perform Capillary Blood Glucose checks as needed.  Exercise Prescription Changes:   Exercise Prescription Changes     Row Name 12/28/23 1029 01/07/24 1033 01/25/24 1025 02/04/24 1036       Response to Exercise   Blood Pressure (Admit) 94/62 98/70 102/62 100/60    Blood Pressure (Exercise) 110/60 108/60 -- --    Blood Pressure (Exit) 90/60 100/60 90/50 100/70    Heart Rate (Admit) 74 bpm 80 bpm 73 bpm 77 bpm    Heart Rate (Exercise) 102 bpm 106 bpm 116 bpm 108 bpm    Heart Rate (Exit) 76 bpm 89 bpm 71 bpm 75 bpm    Symptoms None None None None    Comments Off to a great start with exercise. -- Reviewed home exercise guidelines and goals with Trish. Increased workloads on recumbent elliptical and NuStep machines today. Increased weights. --    Duration Continue with 30 min of aerobic exercise without signs/symptoms of physical distress. Continue with 30 min of aerobic exercise without signs/symptoms of physical distress. Continue with 30 min of aerobic exercise without signs/symptoms of physical distress. Continue with 30 min of aerobic exercise without signs/symptoms of physical distress.    Intensity THRR unchanged THRR unchanged THRR unchanged THRR unchanged      Progression   Progression Continue to progress workloads to maintain intensity without signs/symptoms of physical distress. Continue to progress workloads to maintain intensity without signs/symptoms of physical distress. Continue to progress workloads to maintain intensity without signs/symptoms of physical distress. Continue to progress workloads to maintain intensity without signs/symptoms of physical distress.    Average METs 4.1 3.7 5.5 4.2      Resistance Training   Training Prescription Yes Yes Yes Yes    Weight 2 lbs 2 lbs  3 lbs 3 lbs    Reps 10-15 10-15 10-15 10-15    Time 10 Minutes 10 Minutes 10 Minutes 10 Minutes      Interval Training   Interval Training No No No No      NuStep   Level 1 2 4 4     SPM -- 121 -- 129    Minutes 15 15 15 15     METs -- 3 -- 3.1      Recumbant Elliptical   Level 1 1 3 3     RPM 38 60 60 68    Watts 74 76 99 --    Minutes 15 15 15 15     METs 4.1 3 5.5 5.3      Home Exercise Plan   Plans to continue exercise at -- -- Home (comment)  Walking, fitness videos Home (comment)  Walking, fitness videos    Frequency -- -- Add 4 additional days to program exercise sessions. Add 4 additional days to program exercise sessions.    Initial Home Exercises Provided -- -- 01/25/24 01/25/24       Exercise Comments:   Exercise Comments     Row Name 12/28/23 1131 01/25/24 1049 02/11/24 1121       Exercise Comments Devra Fontana tolerated first session of exercise well without symptoms. Oriented to the exercise equipment and stretchign routine. Reviewed home exercise guidelines and goals with Trish. Reviewed METs with Trish.        Exercise Goals and Review:   Exercise Goals     Row Name 12/20/23  1528             Exercise Goals   Increase Physical Activity Yes       Intervention Provide advice, education, support and counseling about physical activity/exercise needs.;Develop an individualized exercise prescription for aerobic and resistive training based on initial evaluation findings, risk stratification, comorbidities and participant's personal goals.       Expected Outcomes Short Term: Attend rehab on a regular basis to increase amount of physical activity.;Long Term: Exercising regularly at least 3-5 days a week.;Long Term: Add in home exercise to make exercise part of routine and to increase amount of physical activity.       Increase Strength and Stamina Yes       Intervention Provide advice, education, support and counseling about physical activity/exercise  needs.;Develop an individualized exercise prescription for aerobic and resistive training based on initial evaluation findings, risk stratification, comorbidities and participant's personal goals.       Expected Outcomes Short Term: Increase workloads from initial exercise prescription for resistance, speed, and METs.;Short Term: Perform resistance training exercises routinely during rehab and add in resistance training at home;Long Term: Improve cardiorespiratory fitness, muscular endurance and strength as measured by increased METs and functional capacity ( )       Able to understand and use rate of perceived exertion (RPE) scale Yes       Intervention Provide education and explanation on how to use RPE scale       Expected Outcomes Long Term:  Able to use RPE to guide intensity level when exercising independently;Short Term: Able to use RPE daily in rehab to express subjective intensity level       Knowledge and understanding of Target Heart Rate Range (THRR) Yes       Intervention Provide education and explanation of THRR including how the numbers were predicted and where they are located for reference       Expected Outcomes Short Term: Able to state/look up THRR;Short Term: Able to use daily as guideline for intensity in rehab;Long Term: Able to use THRR to govern intensity when exercising independently       Understanding of Exercise Prescription Yes       Intervention Provide education, explanation, and written materials on patient's individual exercise prescription       Expected Outcomes Short Term: Able to explain program exercise prescription;Long Term: Able to explain home exercise prescription to exercise independently          Exercise Goals Re-Evaluation :  Exercise Goals Re-Evaluation     Row Name 12/28/23 1131 01/25/24 1049           Exercise Goal Re-Evaluation   Exercise Goals Review Increase Physical Activity;Increase Strength and Stamina;Able to understand and use rate  of perceived exertion (RPE) scale Increase Physical Activity;Increase Strength and Stamina;Able to understand and use rate of perceived exertion (RPE) scale;Able to check pulse independently;Knowledge and understanding of Target Heart Rate Range (THRR);Understanding of Exercise Prescription      Comments Selda was able to understand and use RPE scale appropriately. Reviewed exercise prescription with Trish. She is walking daily 45 minutes, achieving 5,000 steps. She previously worked out to [employer] for 45 minutes but was only able to achieve 5-10 minutes when she tried a workout recently. Her goal is to be able to do the 45 minute workout again. She wants to be able to exercise without shortness of breath.      Expected Outcomes Progress workloads as tolerated to  help improve cardiorespiratory fitness. Be able to exercise without SOB. Be able to complete [employer] aerobics workout.         Discharge Exercise Prescription (Final Exercise Prescription Changes):  Exercise Prescription Changes - 02/04/24 1036       Response to Exercise   Blood Pressure (Admit) 100/60    Blood Pressure (Exit) 100/70    Heart Rate (Admit) 77 bpm    Heart Rate (Exercise) 108 bpm    Heart Rate (Exit) 75 bpm    Symptoms None    Duration Continue with 30 min of aerobic exercise without signs/symptoms of physical distress.    Intensity THRR unchanged      Progression   Progression Continue to progress workloads to maintain intensity without signs/symptoms of physical distress.    Average METs 4.2      Resistance Training   Training Prescription Yes    Weight 3 lbs    Reps 10-15    Time 10 Minutes      Interval Training   Interval Training No      NuStep   Level 4    SPM 129    Minutes 15    METs 3.1      Recumbant Elliptical   Level 3    RPM 68    Minutes 15    METs 5.3      Home Exercise Plan   Plans to continue exercise at Home (comment)   Walking, fitness videos   Frequency  Add 4 additional days to program exercise sessions.    Initial Home Exercises Provided 01/25/24          Nutrition:  Target Goals: Understanding of nutrition guidelines, daily intake of sodium 1500mg , cholesterol 200mg , calories 30% from fat and 7% or less from saturated fats, daily to have 5 or more servings of fruits and vegetables.  Biometrics:   Post Biometrics - 12/20/23 1527        Post  Biometrics   Waist Circumference 30.5 inches    Hip Circumference 38 inches    Waist to Hip Ratio 0.8 %    Triceps Skinfold 21 mm    % Body Fat 33.4 %    Grip Strength 20 kg    Flexibility 18.25 in    Single Leg Stand 30 seconds          Nutrition Therapy Plan and Nutrition Goals:  Nutrition Therapy & Goals - 02/01/24 1021       Nutrition Therapy   Diet Heart Healthy Diet      Personal Nutrition Goals   Nutrition Goal Patient to identify strategies for reducing cardiovascular risk by attending the [employer] education and nutrition series weekly.   goal not met.   Personal Goal #2 Patient to improve diet quality by using the plate method as a guide for meal planning to include lean protein/plant protein, fruits, vegetables, whole grains, nonfat dairy as part of a well-balanced diet.   goal in progress.   Comments Goals in progress. Kristina Osborne has medical history of pulmonary HTN, HFpEF, s/p mitraclip, hx gi bleed, thrombocytosis (hydroxurea). She reports that her husband recently had a stroke and is working on weight gain. She admits to additional stress carrying for him and is eating out more frequently. She is motivated to improve eating habits and opt for convenience cooking methods. She continues regular follow-up with oncology related to iron  deficiency, thrombocytosis. She does not regularly attend the [employer] education/nutrition series. She has maintained her weight since  starting with our program and BP has been well controlled. Patient will benefit from participation in intensive  cardiac rehab for nutrition, exercise, and lifestyle modification.      Intervention Plan   Intervention Prescribe, educate and counsel regarding individualized specific dietary modifications aiming towards targeted core components such as weight, hypertension, lipid management, diabetes, heart failure and other comorbidities.;Nutrition handout(s) given to patient.    Expected Outcomes Short Term Goal: Understand basic principles of dietary content, such as calories, fat, sodium, cholesterol and nutrients.;Long Term Goal: Adherence to prescribed nutrition plan.          Nutrition Assessments:  MEDIFICTS Score Key: >=70 Need to make dietary changes  40-70 Heart Healthy Diet <= 40 Therapeutic Level Cholesterol Diet    Picture Your Plate Scores: <10 Unhealthy dietary pattern with much room for improvement. 41-50 Dietary pattern unlikely to meet recommendations for good health and room for improvement. 51-60 More healthful dietary pattern, with some room for improvement.  >60 Healthy dietary pattern, although there may be some specific behaviors that could be improved.    Nutrition Goals Re-Evaluation:  Nutrition Goals Re-Evaluation     Row Name 12/28/23 1138 02/01/24 1021           Goals   Current Weight 143 lb 8.3 oz (65.1 kg) 143 lb 4.8 oz (65 kg)      Expected Outcome Trish has medical history of pulmonary HTN, HFpEF, s/p mitraclip, hx gi bleed, thrombocytosis (hydroxurea). She reports that her husband recently had a stroke and is working on weight gain. She admits to additional stress carrying for him and is eating out more frequently. She is motivated to improve eating habits and opt for convenience cooking methods. She continues regular follow-up with oncology related to iron  deficiency, thrombocytosis. Patient will benefit from participation in intensive cardiac rehab for nutrition, exercise, and lifestyle modification. Goals in progress. Kristina Osborne has medical history of pulmonary  HTN, HFpEF, s/p mitraclip, hx gi bleed, thrombocytosis (hydroxurea). She reports that her husband recently had a stroke and is working on weight gain. She admits to additional stress carrying for him and is eating out more frequently. She is motivated to improve eating habits and opt for convenience cooking methods. She continues regular follow-up with oncology related to iron  deficiency, thrombocytosis. She does not regularly attend the [employer] education/nutrition series. She has maintained her weight since starting with our program and BP has been well controlled. Patient will benefit from participation in intensive cardiac rehab for nutrition, exercise, and lifestyle modification.         Nutrition Goals Re-Evaluation:  Nutrition Goals Re-Evaluation     Row Name 12/28/23 1138 02/01/24 1021           Goals   Current Weight 143 lb 8.3 oz (65.1 kg) 143 lb 4.8 oz (65 kg)      Expected Outcome Trish has medical history of pulmonary HTN, HFpEF, s/p mitraclip, hx gi bleed, thrombocytosis (hydroxurea). She reports that her husband recently had a stroke and is working on weight gain. She admits to additional stress carrying for him and is eating out more frequently. She is motivated to improve eating habits and opt for convenience cooking methods. She continues regular follow-up with oncology related to iron  deficiency, thrombocytosis. Patient will benefit from participation in intensive cardiac rehab for nutrition, exercise, and lifestyle modification. Goals in progress. Kristina Osborne has medical history of pulmonary HTN, HFpEF, s/p mitraclip, hx gi bleed, thrombocytosis (hydroxurea). She reports that her husband recently had a  stroke and is working on weight gain. She admits to additional stress carrying for him and is eating out more frequently. She is motivated to improve eating habits and opt for convenience cooking methods. She continues regular follow-up with oncology related to iron  deficiency,  thrombocytosis. She does not regularly attend the [employer] education/nutrition series. She has maintained her weight since starting with our program and BP has been well controlled. Patient will benefit from participation in intensive cardiac rehab for nutrition, exercise, and lifestyle modification.         Nutrition Goals Discharge (Final Nutrition Goals Re-Evaluation):  Nutrition Goals Re-Evaluation - 02/01/24 1021       Goals   Current Weight 143 lb 4.8 oz (65 kg)    Expected Outcome Goals in progress. Kristina Osborne has medical history of pulmonary HTN, HFpEF, s/p mitraclip, hx gi bleed, thrombocytosis (hydroxurea). She reports that her husband recently had a stroke and is working on weight gain. She admits to additional stress carrying for him and is eating out more frequently. She is motivated to improve eating habits and opt for convenience cooking methods. She continues regular follow-up with oncology related to iron  deficiency, thrombocytosis. She does not regularly attend the [facility] education/nutrition series. She has maintained her weight since starting with our program and BP has been well controlled. Patient will benefit from participation in intensive cardiac rehab for nutrition, exercise, and lifestyle modification.          Psychosocial: Target Goals: Acknowledge presence or absence of significant depression and/or stress, maximize coping skills, provide positive support system. Participant is able to verbalize types and ability to use techniques and skills needed for reducing stress and depression.  Initial Review & Psychosocial Screening:  Initial Psych Review & Screening - 12/20/23 1617       Initial Review   Current issues with Current Sleep Concerns;Current Stress Concerns    Source of Stress Concerns Chronic Illness;Family;Financial;Unable to perform yard/household activities    Comments She is feeling some stress with her family needs and issues with chronic fatigue. She's  having a hard time with stamina to keep up an active lifestyle. Not interested in counseling at this time, but she knows to ask any time for additional resources      Family Dynamics   Good Support System? Yes      Barriers   Psychosocial barriers to participate in program The patient should benefit from training in stress management and relaxation.;Psychosocial barriers identified (see note)      Screening Interventions   Interventions Encouraged to exercise;To provide support and resources with identified psychosocial needs;Provide feedback about the scores to participant    Expected Outcomes Long Term Goal: Stressors or current issues are controlled or eliminated.;Short Term goal: Identification and review with participant of any Quality of Life or Depression concerns found by scoring the questionnaire.;Long Term goal: The participant improves quality of Life and PHQ9 Scores as seen by post scores and/or verbalization of changes          Quality of Life Scores:  Quality of Life - 12/20/23 1621       Quality of Life   Select Quality of Life      Quality of Life Scores   Health/Function Pre 18.03 %    Socioeconomic Pre 25.25 %    Psych/Spiritual Pre 16.5 %    Family Pre 21 %    GLOBAL Pre 19.42 %         Scores of 19 and below usually  indicate a poorer quality of life in these areas.  A difference of  2-3 points is a clinically meaningful difference.  A difference of 2-3 points in the total score of the Quality of Life Index has been associated with significant improvement in overall quality of life, self-image, physical symptoms, and general health in studies assessing change in quality of life.  PHQ-9: Review Flowsheet  More data exists      12/20/2023 10/18/2023 09/06/2023 08/09/2023 06/26/2023  Depression screen PHQ 2/9  Decreased Interest 0 0 0 0 0  Down, Depressed, Hopeless 0 0 0 0 0  PHQ - 2 Score 0 0 0 0 0  Altered sleeping 2 - - - -  Tired, decreased energy 3 - - -  -  Change in appetite 0 - - - -  Feeling bad or failure about yourself  0 - - - -  Trouble concentrating 0 - - - -  Moving slowly or fidgety/restless 0 - - - -  Suicidal thoughts 0 - - - -  PHQ-9 Score 5 - - - -  Difficult doing work/chores Somewhat difficult - - - -   Interpretation of Total Score  Total Score Depression Severity:  1-4 = Minimal depression, 5-9 = Mild depression, 10-14 = Moderate depression, 15-19 = Moderately severe depression, 20-27 = Severe depression   Psychosocial Evaluation and Intervention:   Psychosocial Re-Evaluation:  Psychosocial Re-Evaluation     Row Name 12/31/23 1610 01/03/24 1040 01/15/24 1310 02/11/24 1844       Psychosocial Re-Evaluation   Current issues with Current Stress Concerns;Current Sleep Concerns Current Stress Concerns;Current Sleep Concerns Current Stress Concerns;Current Sleep Concerns Current Stress Concerns;Current Sleep Concerns    Comments Trish did not voice any increased concerns or stressors on her first day of exercise. Wll review quality of life and PHQ2-9 and quality of life in the upcoming week Reviewed quality of life and PHQ2-9.Kristina Osborne denies being depressed currently. 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 Kristina Osborne says she is now feeling better and has a little more energy since starting exercise at cardiac rehab.Kristina Osborne said she had home health and found it to be intrusive. Trish's husband has an appointment to be evaluated for neuro rehab. Kristina Osborne says she sometimes has difficulty staying asleep. Discussed sleep hygiene methods. Kristina Osborne is pleased with her husbands appointment with neuor rehab as he will be getting therapy twice a week. Kristina Osborne continues to juggle her own recovery post mitrl valve surgery and taking care of her husband    Expected Outcomes Kristina Osborne will have controlled or decreased stressors upon compleiton of cardiac rehab Trish will have controlled or decreased stressors upon compleiton of  cardiac rehab Trish will have controlled or decreased stressors upon compleiton of cardiac rehab Trish will have controlled or decreased stressors upon compleiton of cardiac rehab    Interventions Stress management education;Encouraged to attend Cardiac Rehabilitation for the exercise;Relaxation education Stress management education;Encouraged to attend Cardiac Rehabilitation for the exercise;Relaxation education Stress management education;Encouraged to attend Cardiac Rehabilitation for the exercise;Relaxation education Stress management education;Encouraged to attend Cardiac Rehabilitation for the exercise;Relaxation education    Continue Psychosocial Services  Follow up required by staff Follow up required by staff Follow up required by staff Follow up required by staff      Initial Review   Source of Stress Concerns Family;Chronic Illness Family;Chronic Illness Family;Chronic Illness Family;Chronic Illness    Comments Will continue to monitor and offer support as needed Will continue to monitor  and offer support as needed Will continue to monitor and offer support as needed Will continue to monitor and offer support as needed       Psychosocial Discharge (Final Psychosocial Re-Evaluation):  Psychosocial Re-Evaluation - 02/11/24 1844       Psychosocial Re-Evaluation   Current issues with Current Stress Concerns;Current Sleep Concerns    Comments Kristina Osborne continues to 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   Vocational Rehab Evaluation & Intervention:  Vocational Rehab - 12/20/23 1529       Initial Vocational Rehab Evaluation & Intervention   Assessment shows need for Vocational Rehabilitation No   retired         Education: Education Goals: Education classes will be provided on a weekly basis, covering required topics. Participant will state understanding/return demonstration of topics presented.     Core Videos: Exercise    Move It!  Clinical staff conducted group or individual video education with verbal and written material and guidebook.  Patient learns the recommended Pritikin exercise program. Exercise with the goal of living a long, healthy life. Some of the health benefits of exercise include controlled diabetes, healthier blood pressure levels, improved cholesterol levels, improved heart and lung capacity, improved sleep, and better body composition. Everyone should speak with their doctor before starting or changing an exercise routine.  Biomechanical Limitations Clinical staff conducted group or individual video education with verbal and written material and guidebook.  Patient learns how biomechanical limitations can impact exercise and how we can mitigate and possibly overcome limitations to have an impactful and balanced exercise routine.  Body Composition Clinical staff conducted group or individual video education with verbal and written material and guidebook.  Patient learns that body composition (ratio of muscle mass to fat mass) is a key component to assessing overall fitness, rather than body weight alone. Increased fat mass, especially visceral belly fat, can put us  at increased risk for metabolic syndrome, type 2 diabetes, heart disease, and even death. It is recommended to combine diet and exercise (cardiovascular and resistance training) to improve your body composition. Seek guidance  from your physician and exercise physiologist before implementing an exercise routine.  Exercise Action Plan Clinical staff conducted group or individual video education with verbal and written material and guidebook.  Patient learns the recommended strategies to achieve and enjoy long-term exercise adherence, including variety, self-motivation, self-efficacy, and positive decision making. Benefits of exercise include fitness, good health, weight management, more energy, better sleep, less stress, and overall well-being.  Medical   Heart Disease Risk Reduction Clinical staff conducted group or individual video education with verbal and written material and guidebook.  Patient learns our heart is our most vital organ as it circulates oxygen, nutrients, white blood cells, and hormones throughout the entire body, and carries waste away. Data supports a plant-based eating plan like the Pritikin Program for its effectiveness in slowing progression of and reversing heart disease. The video  provides a number of recommendations to address heart disease.   Metabolic Syndrome and Belly Fat  Clinical staff conducted group or individual video education with verbal and written material and guidebook.  Patient learns what metabolic syndrome is, how it leads to heart disease, and how one can reverse it and keep it from coming back. You have metabolic syndrome if you have 3 of the following 5 criteria: abdominal obesity, high blood pressure, high triglycerides, low HDL cholesterol, and high blood sugar.  Hypertension and Heart Disease Clinical staff conducted group or individual video education with verbal and written material and guidebook.  Patient learns that high blood pressure, or hypertension, is very common in the [place] . Hypertension is largely due to excessive salt intake, but other important risk factors include being overweight, physical inactivity, drinking too much alcohol, smoking, and not  eating enough potassium from fruits and vegetables. High blood pressure is a leading risk factor for heart attack, stroke, congestive heart failure, dementia, kidney failure, and premature death. Long-term effects of excessive salt intake include stiffening of the arteries and thickening of heart muscle and organ damage. Recommendations include ways to reduce hypertension and the risk of heart disease.  Diseases of Our Time - Focusing on Diabetes Clinical staff conducted group or individual video education with verbal and written material and guidebook.  Patient learns why the best way to stop diseases of our time is prevention, through food and other lifestyle changes. Medicine (such as prescription pills and surgeries) is often only a Band-Aid on the problem, not a long-term solution. Most common diseases of our time include obesity, type 2 diabetes, hypertension, heart disease, and cancer. The Pritikin Program is recommended and has been proven to help reduce, reverse, and/or prevent the damaging effects of metabolic syndrome.  Nutrition   Overview of the Pritikin Eating Plan  Clinical staff conducted group or individual video education with verbal and written material and guidebook.  Patient learns about the Pritikin Eating Plan for disease risk reduction. The Pritikin Eating Plan emphasizes a wide variety of unrefined, minimally-processed carbohydrates, like fruits, vegetables, whole grains, and legumes. Go, Caution, and Stop food choices are explained. Plant-based and lean animal proteins are emphasized. Rationale provided for low sodium intake for blood pressure control, low added sugars for blood sugar stabilization, and low added fats and oils for coronary artery disease risk reduction and weight management.  Calorie Density  Clinical staff conducted group or individual video education with verbal and written material and guidebook.  Patient learns about calorie density and how it impacts the  Pritikin Eating Plan. Knowing the characteristics of the food you choose will help you decide whether those foods will lead to weight gain or weight loss, and whether you want to consume more or less of them. Weight loss is usually a side effect of the Pritikin Eating Plan because of its focus on low calorie-dense foods.  Label Reading  Clinical staff conducted group or individual video education with verbal and written material and guidebook.  Patient learns about the Pritikin recommended label reading guidelines and corresponding recommendations regarding calorie density, added sugars, sodium content, and whole grains.  Dining Out - Part 1  Clinical staff conducted group or individual video education with verbal and written material and guidebook.  Patient learns that restaurant meals can be sabotaging because they can be so high in calories, fat, sodium, and/or sugar. Patient learns recommended strategies on how to positively address this and avoid unhealthy pitfalls.  Facts on  Fats  Clinical staff conducted group or individual video education with verbal and written material and guidebook.  Patient learns that lifestyle modifications can be just as effective, if not more so, as many medications for lowering your risk of heart disease. A Pritikin lifestyle can help to reduce your risk of inflammation and atherosclerosis (cholesterol build-up, or plaque, in the artery walls). Lifestyle interventions such as dietary choices and physical activity address the cause of atherosclerosis. A review of the types of fats and their impact on blood cholesterol levels, along with dietary recommendations to reduce fat intake is also included.  Nutrition Action Plan  Clinical staff conducted group or individual video education with verbal and written material and guidebook.  Patient learns how to incorporate Pritikin recommendations into their lifestyle. Recommendations include planning and keeping personal  health goals in mind as an important part of their success.  Healthy Mind-Set    Healthy Minds, Bodies, Hearts  Clinical staff conducted group or individual video education with verbal and written material and guidebook.  Patient learns how to identify when they are stressed. Video will discuss the impact of that stress, as well as the many benefits of stress management. Patient will also be introduced to stress management techniques. The way we think, act, and feel has an impact on our hearts.  How Our Thoughts Can Heal Our Hearts  Clinical staff conducted group or individual video education with verbal and written material and guidebook.  Patient learns that negative thoughts can cause depression and anxiety. This can result in negative lifestyle behavior and serious health problems. Cognitive behavioral therapy is an effective method to help control our thoughts in order to change and improve our emotional outlook.  Additional Videos:  Exercise    Improving Performance  Clinical staff conducted group or individual video education with verbal and written material and guidebook.  Patient learns to use a non-linear approach by alternating intensity levels and lengths of time spent exercising to help burn more calories and lose more body fat. Cardiovascular exercise helps improve heart health, metabolism, hormonal balance, blood sugar control, and recovery from fatigue. Resistance training improves strength, endurance, balance, coordination, reaction time, metabolism, and muscle mass. Flexibility exercise improves circulation, posture, and balance. Seek guidance from your physician and exercise physiologist before implementing an exercise routine and learn your capabilities and proper form for all exercise.  Introduction to Yoga  Clinical staff conducted group or individual video education with verbal and written material and guidebook.  Patient learns about yoga, a discipline of the coming  together of mind, breath, and body. The benefits of yoga include improved flexibility, improved range of motion, better posture and core strength, increased lung function, weight loss, and positive self-image. Yoga's heart health benefits include lowered blood pressure, healthier heart rate, decreased cholesterol and triglyceride levels, improved immune function, and reduced stress. Seek guidance from your physician and exercise physiologist before implementing an exercise routine and learn your capabilities and proper form for all exercise.  Medical   Aging: Enhancing Your Quality of Life  Clinical staff conducted group or individual video education with verbal and written material and guidebook.  Patient learns key strategies and recommendations to stay in good physical health and enhance quality of life, such as prevention strategies, having an advocate, securing a Health Care Proxy and Power of Attorney, and keeping a list of medications and system for tracking them. It also discusses how to avoid risk for bone loss.  Biology of Weight Control  Clinical  staff conducted group or individual video education with verbal and written material and guidebook.  Patient learns that weight gain occurs because we consume more calories than we burn (eating more, moving less). Even if your body weight is normal, you may have higher ratios of fat compared to muscle mass. Too much body fat puts you at increased risk for cardiovascular disease, heart attack, stroke, type 2 diabetes, and obesity-related cancers. In addition to exercise, following the Pritikin Eating Plan can help reduce your risk.  Decoding Lab Results  Clinical staff conducted group or individual video education with verbal and written material and guidebook.  Patient learns that lab test reflects one measurement whose values change over time and are influenced by many factors, including medication, stress, sleep, exercise, food, hydration,  pre-existing medical conditions, and more. It is recommended to use the knowledge from this video to become more involved with your lab results and evaluate your numbers to speak with your doctor.   Diseases of Our Time - Overview  Clinical staff conducted group or individual video education with verbal and written material and guidebook.  Patient learns that according to the CDC, 50% to 70% of chronic diseases (such as obesity, type 2 diabetes, elevated lipids, hypertension, and heart disease) are avoidable through lifestyle improvements including healthier food choices, listening to satiety cues, and increased physical activity.  Sleep Disorders Clinical staff conducted group or individual video education with verbal and written material and guidebook.  Patient learns how good quality and duration of sleep are important to overall health and well-being. Patient also learns about sleep disorders and how they impact health along with recommendations to address them, including discussing with a physician.  Nutrition  Dining Out - Part 2 Clinical staff conducted group or individual video education with verbal and written material and guidebook.  Patient learns how to plan ahead and communicate in order to maximize their dining experience in a healthy and nutritious manner. Included are recommended food choices based on the type of restaurant the patient is visiting.   Fueling a Banker conducted group or individual video education with verbal and written material and guidebook.  There is a strong connection between our food choices and our health. Diseases like obesity and type 2 diabetes are very prevalent and are in large-part due to lifestyle choices. The Pritikin Eating Plan provides plenty of food and hunger-curbing satisfaction. It is easy to follow, affordable, and helps reduce health risks.  Menu Workshop  Clinical staff conducted group or individual video education  with verbal and written material and guidebook.  Patient learns that restaurant meals can sabotage health goals because they are often packed with calories, fat, sodium, and sugar. Recommendations include strategies to plan ahead and to communicate with the manager, chef, or server to help order a healthier meal.  Planning Your Eating Strategy  Clinical staff conducted group or individual video education with verbal and written material and guidebook.  Patient learns about the Pritikin Eating Plan and its benefit of reducing the risk of disease. The Pritikin Eating Plan does not focus on calories. Instead, it emphasizes high-quality, nutrient-rich foods. By knowing the characteristics of the foods, we choose, we can determine their calorie density and make informed decisions.  Targeting Your Nutrition Priorities  Clinical staff conducted group or individual video education with verbal and written material and guidebook.  Patient learns that lifestyle habits have a tremendous impact on disease risk and progression. This video provides eating and  physical activity recommendations based on your personal health goals, such as reducing LDL cholesterol, losing weight, preventing or controlling type 2 diabetes, and reducing high blood pressure.  Vitamins and Minerals  Clinical staff conducted group or individual video education with verbal and written material and guidebook.  Patient learns different ways to obtain key vitamins and minerals, including through a recommended healthy diet. It is important to discuss all supplements you take with your doctor.   Healthy Mind-Set    Smoking Cessation  Clinical staff conducted group or individual video education with verbal and written material and guidebook.  Patient learns that cigarette smoking and tobacco addiction pose a serious health risk which affects millions of people. Stopping smoking will significantly reduce the risk of heart disease, lung disease,  and many forms of cancer. Recommended strategies for quitting are covered, including working with your doctor to develop a successful plan.  Culinary   Becoming a Set designer conducted group or individual video education with verbal and written material and guidebook.  Patient learns that cooking at home can be healthy, cost-effective, quick, and puts them in control. Keys to cooking healthy recipes will include looking at your recipe, assessing your equipment needs, planning ahead, making it simple, choosing cost-effective seasonal ingredients, and limiting the use of added fats, salts, and sugars.  Cooking - Breakfast and Snacks  Clinical staff conducted group or individual video education with verbal and written material and guidebook.  Patient learns how important breakfast is to satiety and nutrition through the entire day. Recommendations include key foods to eat during breakfast to help stabilize blood sugar levels and to prevent overeating at meals later in the day. Planning ahead is also a key component.  Cooking - Educational psychologist conducted group or individual video education with verbal and written material and guidebook.  Patient learns eating strategies to improve overall health, including an approach to cook more at home. Recommendations include thinking of animal protein as a side on your plate rather than center stage and focusing instead on lower calorie dense options like vegetables, fruits, whole grains, and plant-based proteins, such as beans. Making sauces in large quantities to freeze for later and leaving the skin on your vegetables are also recommended to maximize your experience.  Cooking - Healthy Salads and Dressing Clinical staff conducted group or individual video education with verbal and written material and guidebook.  Patient learns that vegetables, fruits, whole grains, and legumes are the foundations of the Pritikin Eating Plan.  Recommendations include how to incorporate each of these in flavorful and healthy salads, and how to create homemade salad dressings. Proper handling of ingredients is also covered. Cooking - Soups and [employer] - Soups and Desserts Clinical staff conducted group or individual video education with verbal and written material and guidebook.  Patient learns that Pritikin soups and desserts make for easy, nutritious, and delicious snacks and meal components that are low in sodium, fat, sugar, and calorie density, while high in vitamins, minerals, and filling fiber. Recommendations include simple and healthy ideas for soups and desserts.   Overview     The Pritikin Solution Program Overview Clinical staff conducted group or individual video education with verbal and written material and guidebook.  Patient learns that the results of the Pritikin Program have been documented in more than 100 articles published in peer-reviewed journals, and the benefits include reducing risk factors for (and, in some cases, even reversing) high cholesterol, high  blood pressure, type 2 diabetes, obesity, and more! An overview of the three key pillars of the Pritikin Program will be covered: eating well, doing regular exercise, and having a healthy mind-set.  WORKSHOPS  Exercise: Exercise Basics: Building Your Action Plan Clinical staff led group instruction and group discussion with PowerPoint presentation and patient guidebook. To enhance the learning environment the use of posters, models and videos may be added. At the conclusion of this workshop, patients will comprehend the difference between physical activity and exercise, as well as the benefits of incorporating both, into their routine. Patients will understand the FITT (Frequency, Intensity, Time, and Type) principle and how to use it to build an exercise action plan. In addition, safety concerns and other considerations for exercise and cardiac rehab  will be addressed by the presenter. The purpose of this lesson is to promote a comprehensive and effective weekly exercise routine in order to improve patients' overall level of fitness.   Managing Heart Disease: Your Path to a Healthier Heart Clinical staff led group instruction and group discussion with PowerPoint presentation and patient guidebook. To enhance the learning environment the use of posters, models and videos may be added.At the conclusion of this workshop, patients will understand the anatomy and physiology of the heart. Additionally, they will understand how Pritikin's three pillars impact the risk factors, the progression, and the management of heart disease.  The purpose of this lesson is to provide a high-level overview of the heart, heart disease, and how the Pritikin lifestyle positively impacts risk factors.  Exercise Biomechanics Clinical staff led group instruction and group discussion with PowerPoint presentation and patient guidebook. To enhance the learning environment the use of posters, models and videos may be added. Patients will learn how the structural parts of their bodies function and how these functions impact their daily activities, movement, and exercise. Patients will learn how to promote a neutral spine, learn how to manage pain, and identify ways to improve their physical movement in order to promote healthy living. The purpose of this lesson is to expose patients to common physical limitations that impact physical activity. Participants will learn practical ways to adapt and manage aches and pains, and to minimize their effect on regular exercise. Patients will learn how to maintain good posture while sitting, walking, and lifting.  Balance Training and Fall Prevention  Clinical staff led group instruction and group discussion with PowerPoint presentation and patient guidebook. To enhance the learning environment the use of posters, models and videos  may be added. At the conclusion of this workshop, patients will understand the importance of their sensorimotor skills (vision, proprioception, and the vestibular system) in maintaining their ability to balance as they age. Patients will apply a variety of balancing exercises that are appropriate for their current level of function. Patients will understand the common causes for poor balance, possible solutions to these problems, and ways to modify their physical environment in order to minimize their fall risk. The purpose of this lesson is to teach patients about the importance of maintaining balance as they age and ways to minimize their risk of falling.  WORKSHOPS   Nutrition:  Fueling a Ship broker led group instruction and group discussion with PowerPoint presentation and patient guidebook. To enhance the learning environment the use of posters, models and videos may be added. Patients will review the foundational principles of the Pritikin Eating Plan and understand what constitutes a serving size in each of the food groups. Patients will also  learn Pritikin-friendly foods that are better choices when away from home and review make-ahead meal and snack options. Calorie density will be reviewed and applied to three nutrition priorities: weight maintenance, weight loss, and weight gain. The purpose of this lesson is to reinforce (in a group setting) the key concepts around what patients are recommended to eat and how to apply these guidelines when away from home by planning and selecting Pritikin-friendly options. Patients will understand how calorie density may be adjusted for different weight management goals.  Mindful Eating  Clinical staff led group instruction and group discussion with PowerPoint presentation and patient guidebook. To enhance the learning environment the use of posters, models and videos may be added. Patients will briefly review the concepts of the Pritikin  Eating Plan and the importance of low-calorie dense foods. The concept of mindful eating will be introduced as well as the importance of paying attention to internal hunger signals. Triggers for non-hunger eating and techniques for dealing with triggers will be explored. The purpose of this lesson is to provide patients with the opportunity to review the basic principles of the Pritikin Eating Plan, discuss the value of eating mindfully and how to measure internal cues of hunger and fullness using the Hunger Scale. Patients will also discuss reasons for non-hunger eating and learn strategies to use for controlling emotional eating.  Targeting Your Nutrition Priorities Clinical staff led group instruction and group discussion with PowerPoint presentation and patient guidebook. To enhance the learning environment the use of posters, models and videos may be added. Patients will learn how to determine their genetic susceptibility to disease by reviewing their family history. Patients will gain insight into the importance of diet as part of an overall healthy lifestyle in mitigating the impact of genetics and other environmental insults. The purpose of this lesson is to provide patients with the opportunity to assess their personal nutrition priorities by looking at their family history, their own health history and current risk factors. Patients will also be able to discuss ways of prioritizing and modifying the Pritikin Eating Plan for their highest risk areas  Menu  Clinical staff led group instruction and group discussion with PowerPoint presentation and patient guidebook. To enhance the learning environment the use of posters, models and videos may be added. Using menus brought in from [employer], or printed from [employer], patients will apply the Pritikin dining out guidelines that were presented in the [employer] video. Patients will also be able to practice these guidelines  in a variety of provided scenarios. The purpose of this lesson is to provide patients with the opportunity to practice hands-on learning of the Pritikin Dining Out guidelines with actual menus and practice scenarios.  Label Reading Clinical staff led group instruction and group discussion with PowerPoint presentation and patient guidebook. To enhance the learning environment the use of posters, models and videos may be added. Patients will review and discuss the Pritikin label reading guidelines presented in Pritikin's Label Reading Educational series video. Using fool labels brought in from local grocery stores and markets, patients will apply the label reading guidelines and determine if the packaged food meet the Pritikin guidelines. The purpose of this lesson is to provide patients with the opportunity to review, discuss, and practice hands-on learning of the Pritikin Label Reading guidelines with actual packaged food labels. Cooking School  Pritikin's [employer] are designed to teach patients ways to prepare quick, simple, and affordable recipes at home. The importance of  nutrition's role in chronic disease risk reduction is reflected in its emphasis in the overall Pritikin program. By learning how to prepare essential core Pritikin Eating Plan recipes, patients will increase control over what they eat; be able to customize the flavor of foods without the use of added salt, sugar, or fat; and improve the quality of the food they consume. By learning a set of core recipes which are easily assembled, quickly prepared, and affordable, patients are more likely to prepare more healthy foods at home. These workshops focus on convenient breakfasts, simple entres, side dishes, and desserts which can be prepared with minimal effort and are consistent with nutrition recommendations for cardiovascular risk reduction. Cooking [employer] are taught by a Armed forces logistics/support/administrative officer (RD) who has  been trained by the [employer]. The chef or RD has a clear understanding of the importance of minimizing - if not completely eliminating - added fat, sugar, and sodium in recipes. Throughout the series of Cooking School Workshop sessions, patients will learn about healthy ingredients and efficient methods of cooking to build confidence in their capability to prepare    Cooking School weekly topics:  Adding Flavor- Sodium-Free  Fast and Healthy Breakfasts  Powerhouse Plant-Based Proteins  Satisfying Salads and Dressings  Simple Sides and Sauces  International Cuisine-Spotlight on the [employer] Zones  Delicious Desserts  Savory Soups  [employer] - Meals in a Astronomer Appetizers and Snacks  Comforting Weekend Breakfasts  One-Pot Wonders   Fast Evening Meals  Landscape architect Your Pritikin Plate  WORKSHOPS   Healthy Mindset (Psychosocial):  Focused Goals, Sustainable Changes Clinical staff led group instruction and group discussion with PowerPoint presentation and patient guidebook. To enhance the learning environment the use of posters, models and videos may be added. Patients will be able to apply effective goal setting strategies to establish at least one personal goal, and then take consistent, meaningful action toward that goal. They will learn to identify common barriers to achieving personal goals and develop strategies to overcome them. Patients will also gain an understanding of how our mind-set can impact our ability to achieve goals and the importance of cultivating a positive and growth-oriented mind-set. The purpose of this lesson is to provide patients with a deeper understanding of how to set and achieve personal goals, as well as the tools and strategies needed to overcome common obstacles which may arise along the way.  From Head to Heart: The Power of a Healthy Outlook  Clinical staff led group instruction and group discussion with  PowerPoint presentation and patient guidebook. To enhance the learning environment the use of posters, models and videos may be added. Patients will be able to recognize and describe the impact of emotions and mood on physical health. They will discover the importance of self-care and explore self-care practices which may work for them. Patients will also learn how to utilize the 4 C's to cultivate a healthier outlook and better manage stress and challenges. The purpose of this lesson is to demonstrate to patients how a healthy outlook is an essential part of maintaining good health, especially as they continue their cardiac rehab journey.  Healthy Sleep for a Healthy Heart Clinical staff led group instruction and group discussion with PowerPoint presentation and patient guidebook. To enhance the learning environment the use of posters, models and videos may be added. At the conclusion of this workshop, patients will be able to demonstrate knowledge of the importance of sleep to  overall health, well-being, and quality of life. They will understand the symptoms of, and treatments for, common sleep disorders. Patients will also be able to identify daytime and nighttime behaviors which impact sleep, and they will be able to apply these tools to help manage sleep-related challenges. The purpose of this lesson is to provide patients with a general overview of sleep and outline the importance of quality sleep. Patients will learn about a few of the most common sleep disorders. Patients will also be introduced to the concept of "sleep hygiene," and discover ways to self-manage certain sleeping problems through simple daily behavior changes. Finally, the workshop will motivate patients by clarifying the links between quality sleep and their goals of heart-healthy living.   Recognizing and Reducing Stress Clinical staff led group instruction and group discussion with PowerPoint presentation and patient guidebook. To  enhance the learning environment the use of posters, models and videos may be added. At the conclusion of this workshop, patients will be able to understand the types of stress reactions, differentiate between acute and chronic stress, and recognize the impact that chronic stress has on their health. They will also be able to apply different coping mechanisms, such as reframing negative self-talk. Patients will have the opportunity to practice a variety of stress management techniques, such as deep abdominal breathing, progressive muscle relaxation, and/or guided imagery.  The purpose of this lesson is to educate patients on the role of stress in their lives and to provide healthy techniques for coping with it.  Learning Barriers/Preferences:  Learning Barriers/Preferences - 12/20/23 1622       Learning Barriers/Preferences   Learning Barriers Exercise Concerns   Patient has had 2 falls in the past year. But she did do well on her 30 second single leg stand. She achieved 30 seconds   Learning Preferences Written Material;Group Instruction;Computer/Internet;Individual Instruction          Education Topics:  Knowledge Questionnaire Score:  Knowledge Questionnaire Score - 12/20/23 1623       Knowledge Questionnaire Score   Pre Score 23/24          Core Components/Risk Factors/Patient Goals at Admission:  Personal Goals and Risk Factors at Admission - 12/20/23 1531       Core Components/Risk Factors/Patient Goals on Admission    Weight Management Yes    Intervention Weight Management: Develop a combined nutrition and exercise program designed to reach desired caloric intake, while maintaining appropriate intake of nutrient and fiber, sodium and fats, and appropriate energy expenditure required for the weight goal.;Weight Management: Provide education and appropriate resources to help participant work on and attain dietary goals.    Expected Outcomes Short Term: Continue to assess and  modify interventions until short term weight is achieved;Long Term: Adherence to nutrition and physical activity/exercise program aimed toward attainment of established weight goal;Understanding recommendations for meals to include 15-35% energy as protein, 25-35% energy from fat, 35-60% energy from carbohydrates, less than 200mg  of dietary cholesterol, 20-35 gm of total fiber daily;Understanding of distribution of calorie intake throughout the day with the consumption of 4-5 meals/snacks    Heart Failure Yes    Intervention Provide a combined exercise and nutrition program that is supplemented with education, support and counseling about heart failure. Directed toward relieving symptoms such as shortness of breath, decreased exercise tolerance, and extremity edema.    Expected Outcomes Improve functional capacity of life;Short term: Attendance in program 2-3 days a week with increased exercise capacity. Reported lower sodium intake. Reported  increased fruit and vegetable intake. Reports medication compliance.;Short term: Daily weights obtained and reported for increase. Utilizing diuretic protocols set by physician.;Long term: Adoption of self-care skills and reduction of barriers for early signs and symptoms recognition and intervention leading to self-care maintenance.    Hypertension Yes    Intervention Provide education on lifestyle modifcations including regular physical activity/exercise, weight management, moderate sodium restriction and increased consumption of fresh fruit, vegetables, and low fat dairy, alcohol moderation, and smoking cessation.;Monitor prescription use compliance.    Expected Outcomes Long Term: Maintenance of blood pressure at goal levels.;Short Term: Continued assessment and intervention until BP is < 140/62mm HG in hypertensive participants. < 130/41mm HG in hypertensive participants with diabetes, heart failure or chronic kidney disease.    Lipids Yes    Intervention Provide  education and support for participant on nutrition & aerobic/resistive exercise along with prescribed medications to achieve LDL 70mg , HDL >40mg .    Expected Outcomes Short Term: Participant states understanding of desired cholesterol values and is compliant with medications prescribed. Participant is following exercise prescription and nutrition guidelines.;Long Term: Cholesterol controlled with medications as prescribed, with individualized exercise RX and with personalized nutrition plan. Value goals: LDL < 70mg , HDL > 40 mg.    Stress Yes    Intervention Offer individual and/or small group education and counseling on adjustment to heart disease, stress management and health-related lifestyle change. Teach and support self-help strategies.;Refer participants experiencing significant psychosocial distress to appropriate mental health specialists for further evaluation and treatment. When possible, include family members and significant others in education/counseling sessions.    Expected Outcomes Long Term: Emotional wellbeing is indicated by absence of clinically significant psychosocial distress or social isolation.;Short Term: Participant demonstrates changes in health-related behavior, relaxation and other stress management skills, ability to obtain effective social support, and compliance with psychotropic medications if prescribed.    Personal Goal Other Yes    Personal Goal Patient wants to know limits to exercise, increase strength, stamina. To feel safe getting a good workout    Intervention Will continue to monitor pt and progress workloads as tolerated without sign or symptom    Expected Outcomes Pt will achieve her goals and gain strength          Core Components/Risk Factors/Patient Goals Review:   Goals and Risk Factor Review     Row Name 01/15/24 1316 02/11/24 1846           Core Components/Risk Factors/Patient Goals Review   Personal Goals Review Weight  Management/Obesity;Stress Weight Management/Obesity;Stress      Review Kristina Osborne is off to a good start to exercise at cardiac rehab. resting systolic BP's have been in the 90's. Kristina Osborne has been asymptonmatic and encouraged to drink more water as Kristina Osborne is not taking any blood pressure medications at this time resting systolic BP's remains in the 90's. Trish returned to exercis on 02/11/24 after complaining of feeling lightheaded. no reports of symptoms will continue to monitor,      Expected Outcomes Trish will continue to participate in cardiac rehab for exercise, nutrition and lifestyle modifications Kristina Osborne will continue to participate in cardiac rehab for exercise, nutrition and lifestyle modifications         Core Components/Risk Factors/Patient Goals at Discharge (Final Review):   Goals and Risk Factor Review - 02/11/24 1846       Core Components/Risk Factors/Patient Goals Review   Personal Goals Review Weight Management/Obesity;Stress    Review resting systolic BP's remains in the 90's. Trish returned to  exercis on 02/11/24 after complaining of feeling lightheaded. no reports of symptoms will continue to monitor,    Expected Outcomes Trish will continue to participate in cardiac rehab for exercise, nutrition and lifestyle modifications          ITP Comments:  ITP Comments     Row Name 12/20/23 1528 12/28/23 1131 01/15/24 1308 02/11/24 1842     ITP Comments Dr. Gaylyn Keas medical director. Introduction to pritikin education/intensive cardiac rehab. Initial orientation packet reviewed with patient. Melodie completed first session of exercise in the cardiac rehab program well without symptoms. 30 Day ITP Review. Saloma Cadena is off to a good start ot exercise at cardiac rehab. 30 Day ITP Review. Sumaya Riedesel has good participation with exercise at cardiac rehab. Patient retrned to exercise after being out due to complaints of feeling lightheaded       Comments: See ITP Comments

## 2024-02-13 ENCOUNTER — Encounter (HOSPITAL_COMMUNITY)
Admission: RE | Admit: 2024-02-13 | Discharge: 2024-02-13 | Disposition: A | Discharge status: Home / Self Care | Source: Ambulatory Visit | Attending: Internal Medicine

## 2024-02-13 DIAGNOSIS — Z95818 Presence of other cardiac implants and grafts: Secondary | ICD-10-CM | POA: Diagnosis not present

## 2024-02-13 DIAGNOSIS — Z9889 Other specified postprocedural states: Secondary | ICD-10-CM

## 2024-02-15 ENCOUNTER — Telehealth: Payer: Self-pay | Admitting: Internal Medicine

## 2024-02-15 ENCOUNTER — Encounter (HOSPITAL_COMMUNITY)
Admission: RE | Admit: 2024-02-15 | Discharge: 2024-02-15 | Disposition: A | Discharge status: Home / Self Care | Source: Ambulatory Visit | Attending: Internal Medicine | Admitting: Internal Medicine

## 2024-02-15 DIAGNOSIS — Z9889 Other specified postprocedural states: Secondary | ICD-10-CM

## 2024-02-15 DIAGNOSIS — Z95818 Presence of other cardiac implants and grafts: Secondary | ICD-10-CM | POA: Diagnosis not present

## 2024-02-15 NOTE — Telephone Encounter (Signed)
 Spoke with patient and she states she called because she didn't know if she took her plavix  this morning. She then states she realized she did not take it because her bottle was in her purse and she took her dose. Does not need assistance at this time

## 2024-02-15 NOTE — Telephone Encounter (Signed)
 Pt c/o medication issue:  1. Name of Medication: clopidogrel  (PLAVIX ) 75 MG tablet   2. How are you currently taking this medication (dosage and times per day)? 75 mg, Daily with breakfast   3. Are you having a reaction (difficulty breathing--STAT)? No  4. What is your medication issue? Pt can not remember if she took her med this morning and wants to know what to do. Requesting cb

## 2024-02-18 ENCOUNTER — Encounter (HOSPITAL_COMMUNITY)
Admission: RE | Admit: 2024-02-18 | Discharge: 2024-02-18 | Disposition: A | Discharge status: Home / Self Care | Source: Ambulatory Visit | Attending: Internal Medicine | Admitting: Internal Medicine

## 2024-02-18 DIAGNOSIS — Z95818 Presence of other cardiac implants and grafts: Secondary | ICD-10-CM | POA: Diagnosis not present

## 2024-02-18 DIAGNOSIS — Z9889 Other specified postprocedural states: Secondary | ICD-10-CM

## 2024-02-19 ENCOUNTER — Other Ambulatory Visit: Payer: Self-pay | Admitting: Hematology and Oncology

## 2024-02-20 ENCOUNTER — Encounter (HOSPITAL_COMMUNITY)
Admission: RE | Admit: 2024-02-20 | Discharge: 2024-02-20 | Disposition: A | Discharge status: Home / Self Care | Source: Ambulatory Visit | Attending: Internal Medicine

## 2024-02-20 DIAGNOSIS — Z95818 Presence of other cardiac implants and grafts: Secondary | ICD-10-CM | POA: Diagnosis not present

## 2024-02-20 DIAGNOSIS — Z9889 Other specified postprocedural states: Secondary | ICD-10-CM

## 2024-02-22 ENCOUNTER — Encounter (HOSPITAL_COMMUNITY)
Admission: RE | Admit: 2024-02-22 | Discharge: 2024-02-22 | Disposition: A | Discharge status: Home / Self Care | Source: Ambulatory Visit | Attending: Internal Medicine

## 2024-02-22 ENCOUNTER — Ambulatory Visit: Payer: Medicare Other | Admitting: Internal Medicine

## 2024-02-22 ENCOUNTER — Other Ambulatory Visit: Payer: Self-pay | Admitting: Physician Assistant

## 2024-02-22 DIAGNOSIS — Z9889 Other specified postprocedural states: Secondary | ICD-10-CM

## 2024-02-22 DIAGNOSIS — Z95818 Presence of other cardiac implants and grafts: Secondary | ICD-10-CM | POA: Diagnosis not present

## 2024-02-25 ENCOUNTER — Encounter (HOSPITAL_COMMUNITY): Admission: RE | Admit: 2024-02-25 | Source: Ambulatory Visit

## 2024-02-26 ENCOUNTER — Inpatient Hospital Stay

## 2024-02-27 ENCOUNTER — Encounter (HOSPITAL_COMMUNITY)
Admission: RE | Admit: 2024-02-27 | Discharge: 2024-02-27 | Disposition: A | Discharge status: Home / Self Care | Source: Ambulatory Visit | Attending: Internal Medicine | Admitting: Internal Medicine

## 2024-02-27 ENCOUNTER — Telehealth (HOSPITAL_COMMUNITY): Payer: Self-pay | Admitting: *Deleted

## 2024-02-27 DIAGNOSIS — Z9889 Other specified postprocedural states: Secondary | ICD-10-CM | POA: Insufficient documentation

## 2024-02-27 NOTE — Telephone Encounter (Signed)
 Left message to call cardiac rehab.Thayer Headings RN BSN

## 2024-03-03 ENCOUNTER — Encounter (HOSPITAL_COMMUNITY)
Admission: RE | Admit: 2024-03-03 | Discharge: 2024-03-03 | Disposition: A | Discharge status: Home / Self Care | Source: Ambulatory Visit | Attending: Internal Medicine | Admitting: Internal Medicine

## 2024-03-03 DIAGNOSIS — Z9889 Other specified postprocedural states: Secondary | ICD-10-CM

## 2024-03-04 ENCOUNTER — Inpatient Hospital Stay: Attending: Hematology and Oncology

## 2024-03-04 DIAGNOSIS — D500 Iron deficiency anemia secondary to blood loss (chronic): Secondary | ICD-10-CM | POA: Insufficient documentation

## 2024-03-04 DIAGNOSIS — D473 Essential (hemorrhagic) thrombocythemia: Secondary | ICD-10-CM | POA: Insufficient documentation

## 2024-03-04 DIAGNOSIS — K5520 Angiodysplasia of colon without hemorrhage: Secondary | ICD-10-CM | POA: Insufficient documentation

## 2024-03-04 LAB — CBC WITH DIFFERENTIAL (CANCER CENTER ONLY)
Abs Immature Granulocytes: 0.01 K/uL (ref 0.00–0.07)
Basophils Absolute: 0 K/uL (ref 0.0–0.1)
Basophils Relative: 1 %
Eosinophils Absolute: 0 K/uL (ref 0.0–0.5)
Eosinophils Relative: 0 %
HCT: 33.6 % — ABNORMAL LOW (ref 36.0–46.0)
Hemoglobin: 11.9 g/dL — ABNORMAL LOW (ref 12.0–15.0)
Immature Granulocytes: 0 %
Lymphocytes Relative: 16 %
Lymphs Abs: 0.6 K/uL — ABNORMAL LOW (ref 0.7–4.0)
MCH: 45.2 pg — ABNORMAL HIGH (ref 26.0–34.0)
MCHC: 35.4 g/dL (ref 30.0–36.0)
MCV: 127.8 fL — ABNORMAL HIGH (ref 80.0–100.0)
Monocytes Absolute: 0.4 K/uL (ref 0.1–1.0)
Monocytes Relative: 11 %
Neutro Abs: 2.5 K/uL (ref 1.7–7.7)
Neutrophils Relative %: 72 %
Platelet Count: 397 K/uL (ref 150–400)
RBC: 2.63 MIL/uL — ABNORMAL LOW (ref 3.87–5.11)
RDW: 13.3 % (ref 11.5–15.5)
WBC Count: 3.5 K/uL — ABNORMAL LOW (ref 4.0–10.5)
nRBC: 0 % (ref 0.0–0.2)

## 2024-03-04 LAB — CMP (CANCER CENTER ONLY)
ALT: 13 U/L (ref 0–44)
AST: 22 U/L (ref 15–41)
Albumin: 4.3 g/dL (ref 3.5–5.0)
Alkaline Phosphatase: 59 U/L (ref 38–126)
Anion gap: 5 (ref 5–15)
BUN: 12 mg/dL (ref 8–23)
CO2: 29 mmol/L (ref 22–32)
Calcium: 10.1 mg/dL (ref 8.9–10.3)
Chloride: 106 mmol/L (ref 98–111)
Creatinine: 0.74 mg/dL (ref 0.44–1.00)
GFR, Estimated: >60 mL/min (ref >60)
Glucose, Bld: 117 mg/dL — ABNORMAL HIGH (ref 70–99)
Potassium: 4 mmol/L (ref 3.5–5.1)
Sodium: 140 mmol/L (ref 135–145)
Total Bilirubin: 0.5 mg/dL (ref 0.0–1.2)
Total Protein: 6.7 g/dL (ref 6.5–8.1)

## 2024-03-04 LAB — FERRITIN: Ferritin: 64 ng/mL (ref 11–307)

## 2024-03-04 NOTE — Progress Notes (Signed)
 Cardiology Office Note:  .   Date:  03/11/2024  ID:  Kristina Osborne, DOB [birth year], MRN [id number] PCP: Norleen Lynwood ORN, MD  [facility] Providers Cardiologist:  Vinie JAYSON Maxcy, MD Structural Heart:  Lurena MARLA Red, MD   History of Present Illness: .   Kristina Osborne is a 73 y.o. 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 She was diagnosed with JAK2 mutation and started on hydroxyurea . Initially treated with aspirin due to stroke risk concerns, she developed gastrointestinal bleeding, leading to iron  infusions over a year and a half. Her last GI bleed was last year, and she has been advised against taking anticoagulants. She continues to receive iron  infusions as needed due to anemia from hydroxyurea  use.   Echocardiogram 10/11/23 showed preserved EF 60-65% with marked abnormality of the mitral valve with moderate calcification and deformity of the posterior leaflet with moderate prolapse and very severely directed MR with filling of the entire LA. TEE 10/24/23 confirmed severe flail of the posterior leaflet and degenerative MV with severe MR. She was admitted 3/4-11/02/23 with acute on chronic diastolic heart failure exacerbation. [facility] 11/01/23 showed normal coronary arteries and giant V waves to around 48 mmHg with a mean wedge pressure of 27 mmHg. Following discharge she met with Dr. Maryjane and due to severe mitral annular calcification, he did not think that she would be best served by a surgical approach and referred her to Dr. Red for consideration of mTEER. (mean  mitral valve gradient of 2.5 mmHg at 85 bpm and clear P2 flail). S/p percutaneous mitral valve repair with MitraClip XTW + NTW at A2/P2 on 11/28/23 by Dr. Red. Post op echo showed EF 65-70%, small iASD, and 2 mitra-clips noted in the A2/P2 position with mild residual MR with a mean gradient of 6 mm hg. She was discharged on Plavix  monotherapy given history of GI bleeding.  Patient here for f/u. She's really enjoying cardiac rehab and formed a friendship with 3 ladies and will continue exercise with them. She walks her dog a mile daily, gets ~5000 steps a day. No bleeding problems on the plavix . Has occasional PVC's that aren't bothersome.     ROS:    Studies Reviewed: SABRA         Prior CV Studies:   Echo 12/2023 IMPRESSIONS     1. Left ventricular ejection fraction, by estimation, is 60 to 65%. Left  ventricular ejection fraction by 3D volume is 60 %. The left ventricle has  normal function. The left ventricle has no regional wall motion  abnormalities. Left ventricular diastolic   parameters are consistent with Grade I diastolic dysfunction (impaired  relaxation). The average left ventricular global longitudinal strain is  -24.7 %. The global longitudinal strain is normal.   2. Right ventricular systolic function is normal. The right ventricular  size is normal. There is normal pulmonary artery systolic pressure. The  estimated right ventricular systolic pressure is 25.5 mmHg.   3. Left atrial size was moderately dilated.   4. No definite ASD visualized on this study.   5. Status  post mTEER. 2 Mitraclips in the A2/P2 position. Mildly elevated  mean gradient 4 mmHg. There is mild-moderate residual mitral  regurgitation.   6. The aortic valve is tricuspid. Aortic valve regurgitation is trivial.  No aortic stenosis is present.   7. The inferior vena cava is normal in size with greater than 50%  respiratory variability, suggesting right atrial pressure of 3 mmHg.    Risk  Assessment/Calculations:             Physical Exam:   VS:  BP 90/60   Pulse 81   Ht 5' 6 (1.676 m)   Wt 142 lb (64.4 kg)   LMP  (LMP Unknown)   SpO2 97%   BMI 22.92 kg/m    Orhtostatics: No data found. Wt Readings from Last 3 Encounters:  03/11/24 142 lb (64.4 kg)  02/07/24 142 lb (64.4 kg)  01/10/24 144 lb (65.3 kg)    GEN: Well nourished, well developed in no acute distress NECK: No JVD; No carotid bruits CARDIAC: RRR, 1/6 systolic murmur LSB RESPIRATORY:  Clear to auscultation without rales, wheezing or rhonchi  ABDOMEN: Soft, non-tender, non-distended EXTREMITIES:  No edema; No deformity   ASSESSMENT AND PLAN: .   Severe mitral regurgitation s/p mTEER:  -- Pt doing well s/p clip.   -- SBE prophylaxis discussed  -- Continue Plavix  monotherapy given issues with GI bleeding and anemia in the past. Per Dr. Wendel lifelong plavix  would be optimal. -continue cardiac rehab --labs reviewed from 03/04/24 and are stable.    Chronic HFpEF: -- Appears euvolemic.  -- LVEF 60% G1DD  --  Lasix  PRN-not using   Anemia: -- Followed closely by Dr. Federico   Thrombocytosis: -- Continue hydroxyurea  500 mg twice daily.  -- Followed by Dr. Federico.               Dispo: f/u in 6 months  Signed, Olivia Pavy, PA-C

## 2024-03-05 ENCOUNTER — Encounter (HOSPITAL_COMMUNITY)
Admission: RE | Admit: 2024-03-05 | Discharge: 2024-03-05 | Disposition: A | Discharge status: Home / Self Care | Source: Ambulatory Visit | Attending: Internal Medicine

## 2024-03-05 DIAGNOSIS — Z9889 Other specified postprocedural states: Secondary | ICD-10-CM

## 2024-03-07 ENCOUNTER — Encounter (HOSPITAL_COMMUNITY)
Admission: RE | Admit: 2024-03-07 | Discharge: 2024-03-07 | Disposition: A | Discharge status: Home / Self Care | Source: Ambulatory Visit | Attending: Internal Medicine | Admitting: Internal Medicine

## 2024-03-07 DIAGNOSIS — Z9889 Other specified postprocedural states: Secondary | ICD-10-CM | POA: Diagnosis not present

## 2024-03-10 ENCOUNTER — Encounter (HOSPITAL_COMMUNITY)
Admission: RE | Admit: 2024-03-10 | Discharge: 2024-03-10 | Disposition: A | Discharge status: Home / Self Care | Source: Ambulatory Visit | Attending: Internal Medicine

## 2024-03-10 DIAGNOSIS — Z9889 Other specified postprocedural states: Secondary | ICD-10-CM | POA: Diagnosis not present

## 2024-03-11 ENCOUNTER — Ambulatory Visit: Attending: Physician Assistant | Admitting: Physician Assistant

## 2024-03-11 ENCOUNTER — Encounter: Payer: Self-pay | Admitting: Physician Assistant

## 2024-03-11 VITALS — BP 90/60 | HR 81 | Ht 66.0 in | Wt 142.0 lb

## 2024-03-11 DIAGNOSIS — D649 Anemia, unspecified: Secondary | ICD-10-CM | POA: Insufficient documentation

## 2024-03-11 DIAGNOSIS — I5032 Chronic diastolic (congestive) heart failure: Secondary | ICD-10-CM | POA: Insufficient documentation

## 2024-03-11 DIAGNOSIS — D75839 Thrombocytosis, unspecified: Secondary | ICD-10-CM | POA: Diagnosis present

## 2024-03-11 DIAGNOSIS — Z9889 Other specified postprocedural states: Secondary | ICD-10-CM | POA: Diagnosis present

## 2024-03-11 DIAGNOSIS — Z95818 Presence of other cardiac implants and grafts: Secondary | ICD-10-CM | POA: Diagnosis present

## 2024-03-11 NOTE — Progress Notes (Signed)
 Cardiac Individual Treatment Plan  Patient Details  Name: Nefertari Rebman MRN: [id number] Date of Birth: [birth year] Referring Provider:   Flowsheet Row INTENSIVE CARDIAC REHAB ORIENT from 12/20/2023 in [facility]  Referring Provider Vinie Maxcy, MD    Initial Encounter Date:  Flowsheet Row INTENSIVE CARDIAC REHAB ORIENT from 12/20/2023 in [facility]  Date 12/20/23    Visit Diagnosis: 11/28/23 mitraclip  Patient's Home Medications on Admission:  Current Outpatient Medications:    acetaminophen  (TYLENOL ) 500 MG tablet, Take 1,000 mg by mouth every 6 (six) hours as needed for headache (pain.)., Disp: , Rfl:    amoxicillin  (AMOXIL ) 500 MG tablet, Take 4 tablets (2,000 mg total) by mouth as directed. 1 hour prior to dental work including cleanings, Disp: 12 tablet, Rfl: 12   cholestyramine  light (PREVALITE ) 4 g packet, Take 2 g by mouth 2 (two) times daily., Disp: , Rfl:    clopidogrel  (PLAVIX ) 75 MG tablet, Take 1 tablet (75 mg total) by mouth daily with breakfast., Disp: 90 tablet, Rfl: 1   dicyclomine  (BENTYL ) 20 MG tablet, Take 1 tablet (20 mg total) by mouth 2 (two) times daily., Disp: 20 tablet, Rfl: 0   famotidine  (PEPCID ) 40 MG tablet, TAKE 1 TABLET(40 MG) BY MOUTH DAILY, Disp: 90 tablet, Rfl: 0   folic acid  (FOLVITE ) 1 MG tablet, TAKE 1 TABLET(1 MG) BY MOUTH DAILY, Disp: 90 tablet, Rfl: 1   furosemide  (LASIX ) 40 MG tablet, Take 1 tablet (40 mg total) by mouth as needed for fluid or edema., Disp: 90 tablet, Rfl: 3   Glucosamine-Chondroitin (COSAMIN DS PO), Take 1 tablet by mouth daily., Disp: , Rfl:    hydroxyurea  (HYDREA ) 500 MG capsule, Take 2 capsules (1,000 mg total) by mouth 2 (two) times daily. May take with food to minimize GI side effects., Disp: 120 capsule, Rfl: 1   Lycopene 10 MG CAPS, Take 10 mg by mouth daily., Disp: , Rfl:    sucralfate  (CARAFATE ) 1 g  tablet, TAKE 1 TABLET(1 GRAM) BY MOUTH FOUR TIMES DAILY AT BEDTIME WITH MEALS (Patient taking differently: Take 1 g by mouth at bedtime.), Disp: 360 tablet, Rfl: 1  Past Medical History: Past Medical History:  Diagnosis Date   ABLA (acute blood loss anemia) 04/16/2022   Congenital malformation of intestinal fixation    Gastritis    Gastroesophageal reflux disease without esophagitis 09/10/2021   IDA (iron  deficiency anemia)    Mitral valve prolapse    Persistent hyperplasia of thymus (HCC)    S/P Thymectomy   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 Wendel   Thrombocytosis     Tobacco Use: Social History   Tobacco Use  Smoking Status Former   Current packs/day: 0.00   Types: Cigarettes   Quit date: 2002   Years since quitting: 23.5  Smokeless Tobacco Never  Tobacco Comments   Started smoking in her early 20's.   Smoked 0.5PP at her heaviest.     Labs: Review Flowsheet       Latest Ref Rng & Units 04/16/2022 10/24/2023 11/01/2023  Labs for ITP Cardiac and Pulmonary Rehab  Hemoglobin A1c 4.8 - 5.6 % 4.4  - -  PH, Arterial 7.35 - 7.45 - - 7.401   PCO2 arterial 32 - 48 mmHg - - 38.9   Bicarbonate 20.0 - 28.0 mmol/L - -  25.3  24.2   TCO2 22 - 32 mmol/L - 20  27  25    O2 Saturation % - - 73  92     Details       Multiple values from one day are sorted in reverse-chronological order         Capillary Blood Glucose: Lab Results  Component Value Date   GLUCAP 131 (H) 02/06/2024     Exercise Target Goals: Exercise Program Goal: Individual exercise prescription set using results from initial 6 min walk test and THRR while considering  patient's activity barriers and safety.   Exercise Prescription Goal: Initial exercise prescription builds to 30-45 minutes a day of aerobic activity, 2-3 days per week.  Home exercise guidelines will be given to patient during program as part of  exercise prescription that the participant will acknowledge.  Activity Barriers & Risk Stratification:  Activity Barriers & Cardiac Risk Stratification - 12/20/23 1605       Activity Barriers & Cardiac Risk Stratification   Activity Barriers Deconditioning;History of Falls    Cardiac Risk Stratification High          6 Minute Walk:  6 Minute Walk     Row Name 12/20/23 1601         6 Minute Walk   Phase Initial     Distance 1745 feet     Walk Time 6 minutes     # of Rest Breaks 0     MPH 3.3     METS 3.64     RPE 7     Perceived Dyspnea  0     VO2 Peak 12.75     Symptoms Yes (comment)     Comments Post walk test patient states she felt swimmy headed, patient denigns SOB, light headed feeling or visual changes. Resolved within 30 seconds. Orthostatics seated 98/63 standing 93/63 asymptomatic.     Resting HR 75 bpm     Resting BP 104/60     Resting Oxygen Saturation  98 %     Exercise Oxygen Saturation  during 6 min walk 100 %     Max Ex. HR 88 bpm     Max Ex. BP 116/62     2 Minute Post BP 100/60        Oxygen Initial Assessment:   Oxygen Re-Evaluation:   Oxygen Discharge (Final Oxygen Re-Evaluation):   Initial Exercise Prescription:  Initial Exercise Prescription - 12/20/23 1500       Date of Initial Exercise RX and Referring Provider   Date 12/20/23    Referring Provider Vinie Maxcy, MD    Expected Discharge Date 03/12/24      NuStep   Level 1    SPM 80    Minutes 15    METs 1.8      Recumbant Elliptical   Level 1    RPM 40    Watts 60    Minutes 15    METs 2.1      Prescription Details   Frequency (times per week) 3    Duration Progress to 30 minutes of continuous aerobic without signs/symptoms of physical distress      Intensity   THRR 40-80% of Max Heartrate 60-119    Ratings of Perceived Exertion 11-13    Perceived Dyspnea 0-4      Progression   Progression Continue progressive overload as per policy without signs/symptoms or  physical distress.      Paramedic Prescription  Yes    Reps 10-15          Perform Capillary Blood Glucose checks as needed.  Exercise Prescription Changes:   Exercise Prescription Changes     Row Name 12/28/23 1029 01/07/24 1033 01/25/24 1025 02/04/24 1036 02/22/24 1023     Response to Exercise   Blood Pressure (Admit) 94/62 98/70 102/62 100/60 108/60   Blood Pressure (Exercise) 110/60 108/60 -- -- --   Blood Pressure (Exit) 90/60 100/60 90/50 100/70 104/64   Heart Rate (Admit) 74 bpm 80 bpm 73 bpm 77 bpm 77 bpm   Heart Rate (Exercise) 102 bpm 106 bpm 116 bpm 108 bpm 112 bpm   Heart Rate (Exit) 76 bpm 89 bpm 71 bpm 75 bpm 75 bpm   Symptoms None None None None None   Comments Off to a great start with exercise. -- Reviewed home exercise guidelines and goals with Trish. Increased workloads on recumbent elliptical and NuStep machines today. Increased weights. -- --   Duration Continue with 30 min of aerobic exercise without signs/symptoms of physical distress. Continue with 30 min of aerobic exercise without signs/symptoms of physical distress. Continue with 30 min of aerobic exercise without signs/symptoms of physical distress. Continue with 30 min of aerobic exercise without signs/symptoms of physical distress. Continue with 30 min of aerobic exercise without signs/symptoms of physical distress.   Intensity THRR unchanged THRR unchanged THRR unchanged THRR unchanged THRR unchanged     Progression   Progression Continue to progress workloads to maintain intensity without signs/symptoms of physical distress. Continue to progress workloads to maintain intensity without signs/symptoms of physical distress. Continue to progress workloads to maintain intensity without signs/symptoms of physical distress. Continue to progress workloads to maintain intensity without signs/symptoms of physical distress. Continue to progress workloads to maintain intensity without  signs/symptoms of physical distress.   Average METs 4.1 3.7 5.5 4.2 4.2     Resistance Training   Training Prescription Yes Yes Yes Yes Yes   Weight 2 lbs 2 lbs 3 lbs 3 lbs 3 lbs   Reps 10-15 10-15 10-15 10-15 10-15   Time 10 Minutes 10 Minutes 10 Minutes 10 Minutes 5 Minutes     Interval Training   Interval Training No No No No No     NuStep   Level 1 2 4 4 4    SPM -- 121 -- 129 149   Minutes 15 15 15 15 15    METs -- 3 -- 3.1 3.4     Recumbant Elliptical   Level 1 1 3 3 3    RPM 38 60 60 68 67   Watts 74 76 99 -- 94   Minutes 15 15 15 15 15    METs 4.1 3 5.5 5.3 5.1     Home Exercise Plan   Plans to continue exercise at -- -- Home (comment)  Walking, fitness videos Home (comment)  Walking, fitness videos Home (comment)  Walking, fitness videos   Frequency -- -- Add 4 additional days to program exercise sessions. Add 4 additional days to program exercise sessions. Add 4 additional days to program exercise sessions.   Initial Home Exercises Provided -- -- 01/25/24 01/25/24 01/25/24    Row Name 03/05/24 1029 03/10/24 1033           Response to Exercise   Blood Pressure (Admit) 100/60 104/60      Blood Pressure (Exercise) 108/68 --      Blood Pressure (Exit) 100/52 96/60      Heart Rate (  Admit) 70 bpm 72 bpm      Heart Rate (Exercise) 122 bpm 114 bpm      Heart Rate (Exit) 79 bpm 75 bpm      Rating of Perceived Exertion (Exercise) 9 9      Symptoms None None      Comments Started treadmill today. Increased workload on recumbent stepper. --      Duration Continue with 30 min of aerobic exercise without signs/symptoms of physical distress. Continue with 30 min of aerobic exercise without signs/symptoms of physical distress.      Intensity THRR unchanged THRR unchanged        Progression   Progression Continue to progress workloads to maintain intensity without signs/symptoms of physical distress. Continue to progress workloads to maintain intensity without signs/symptoms of  physical distress.      Average METs 3.8 4        Resistance Training   Training Prescription No Yes      Weight Relaxation day, no weights. 3 lbs      Reps -- 10-15      Time -- 5 Minutes        Interval Training   Interval Training No No        Treadmill   MPH 3.3 3.3      Grade 1 1      Minutes 15 15      METs 3.98 3.98        NuStep   Level 5 5      SPM 113 --      Minutes 15 15      METs 3.7 --        Home Exercise Plan   Plans to continue exercise at Home (comment)  Walking, fitness videos Home (comment)  Walking, fitness videos      Frequency Add 4 additional days to program exercise sessions. Add 4 additional days to program exercise sessions.      Initial Home Exercises Provided 01/25/24 01/25/24         Exercise Comments:   Exercise Comments     Row Name 12/28/23 1131 01/25/24 1049 02/11/24 1121 03/03/24 1046     Exercise Comments Avelina tolerated first session of exercise well without symptoms. Oriented to the exercise equipment and stretchign routine. Reviewed home exercise guidelines and goals with Trish. Reviewed METs with Trish. Reviewed METs and goals with Trish.       Exercise Goals and Review:   Exercise Goals     Row Name 12/20/23 1528             Exercise Goals   Increase Physical Activity Yes       Intervention Provide advice, education, support and counseling about physical activity/exercise needs.;Develop an individualized exercise prescription for aerobic and resistive training based on initial evaluation findings, risk stratification, comorbidities and participant's personal goals.       Expected Outcomes Short Term: Attend rehab on a regular basis to increase amount of physical activity.;Long Term: Exercising regularly at least 3-5 days a week.;Long Term: Add in home exercise to make exercise part of routine and to increase amount of physical activity.       Increase Strength and Stamina Yes       Intervention Provide advice,  education, support and counseling about physical activity/exercise needs.;Develop an individualized exercise prescription for aerobic and resistive training based on initial evaluation findings, risk stratification, comorbidities and participant's personal goals.       Expected Outcomes  Short Term: Increase workloads from initial exercise prescription for resistance, speed, and METs.;Short Term: Perform resistance training exercises routinely during rehab and add in resistance training at home;Long Term: Improve cardiorespiratory fitness, muscular endurance and strength as measured by increased METs and functional capacity ( )       Able to understand and use rate of perceived exertion (RPE) scale Yes       Intervention Provide education and explanation on how to use RPE scale       Expected Outcomes Long Term:  Able to use RPE to guide intensity level when exercising independently;Short Term: Able to use RPE daily in rehab to express subjective intensity level       Knowledge and understanding of Target Heart Rate Range (THRR) Yes       Intervention Provide education and explanation of THRR including how the numbers were predicted and where they are located for reference       Expected Outcomes Short Term: Able to state/look up THRR;Short Term: Able to use daily as guideline for intensity in rehab;Long Term: Able to use THRR to govern intensity when exercising independently       Understanding of Exercise Prescription Yes       Intervention Provide education, explanation, and written materials on patient's individual exercise prescription       Expected Outcomes Short Term: Able to explain program exercise prescription;Long Term: Able to explain home exercise prescription to exercise independently          Exercise Goals Re-Evaluation :  Exercise Goals Re-Evaluation     Row Name 12/28/23 1131 01/25/24 1049 02/22/24 1133 03/03/24 1046 03/05/24 1127     Exercise Goal Re-Evaluation   Exercise  Goals Review Increase Physical Activity;Increase Strength and Stamina;Able to understand and use rate of perceived exertion (RPE) scale Increase Physical Activity;Increase Strength and Stamina;Able to understand and use rate of perceived exertion (RPE) scale;Able to check pulse independently;Knowledge and understanding of Target Heart Rate Range (THRR);Understanding of Exercise Prescription Increase Physical Activity;Increase Strength and Stamina;Able to understand and use rate of perceived exertion (RPE) scale;Able to check pulse independently;Knowledge and understanding of Target Heart Rate Range (THRR);Understanding of Exercise Prescription Increase Physical Activity;Increase Strength and Stamina;Able to understand and use rate of perceived exertion (RPE) scale;Able to check pulse independently;Knowledge and understanding of Target Heart Rate Range (THRR);Understanding of Exercise Prescription Increase Physical Activity;Increase Strength and Stamina;Able to understand and use rate of perceived exertion (RPE) scale;Able to check pulse independently;Knowledge and understanding of Target Heart Rate Range (THRR);Understanding of Exercise Prescription   Comments Sukhman was able to understand and use RPE scale appropriately. Reviewed exercise prescription with Trish. She is walking daily 45 minutes, achieving 5,000 steps. She previously worked out to [employer] for 45 minutes but was only able to achieve 5-10 minutes when she tried a workout recently. Her goal is to be able to do the 45 minute workout again. She wants to be able to exercise without shortness of breath. Carley continues to make excellent progress with exercise. Increasing workloads appropriately. Carley states that she still has difficulty walking up hills/ inclines. We discussed adding the treadmill with incline to her exercise prescription, and she will switch from the recumbent elliptical machine to the TM and increase her level on the  recumbent stepper. She hasn't resumed [employer] workouts at home but plans to this week. She would like to extend to make up missed sessions. Will extend to 04/07/24. Trish started the treadmill today to help  build endurance walking up hils/ inclines at home. She tolerated 3.3-3.5 mph with 1% incline well.   Expected Outcomes Progress workloads as tolerated to help improve cardiorespiratory fitness. Be able to exercise without SOB. Be able to complete [employer] aerobics workout. Continue to progress workloads as tolerated to increase strengthe and stamina. Carley will switch to the TM next session with incline to help build endurance walking up hills/ inclines. Continue progression on the TM to build endurance walking up hills and inclines.      Discharge Exercise Prescription (Final Exercise Prescription Changes):  Exercise Prescription Changes - 03/10/24 1033       Response to Exercise   Blood Pressure (Admit) 104/60    Blood Pressure (Exit) 96/60    Heart Rate (Admit) 72 bpm    Heart Rate (Exercise) 114 bpm    Heart Rate (Exit) 75 bpm    Rating of Perceived Exertion (Exercise) 9    Symptoms None    Duration Continue with 30 min of aerobic exercise without signs/symptoms of physical distress.    Intensity THRR unchanged      Progression   Progression Continue to progress workloads to maintain intensity without signs/symptoms of physical distress.    Average METs 4      Resistance Training   Training Prescription Yes    Weight 3 lbs    Reps 10-15    Time 5 Minutes      Interval Training   Interval Training No      Treadmill   MPH 3.3    Grade 1    Minutes 15    METs 3.98      NuStep   Level 5    Minutes 15      Home Exercise Plan   Plans to continue exercise at Home (comment)   Walking, fitness videos   Frequency Add 4 additional days to program exercise sessions.    Initial Home Exercises Provided 01/25/24          Nutrition:  Target Goals: Understanding of  nutrition guidelines, daily intake of sodium 1500mg , cholesterol 200mg , calories 30% from fat and 7% or less from saturated fats, daily to have 5 or more servings of fruits and vegetables.  Biometrics:   Post Biometrics - 12/20/23 1527        Post  Biometrics   Waist Circumference 30.5 inches    Hip Circumference 38 inches    Waist to Hip Ratio 0.8 %    Triceps Skinfold 21 mm    % Body Fat 33.4 %    Grip Strength 20 kg    Flexibility 18.25 in    Single Leg Stand 30 seconds          Nutrition Therapy Plan and Nutrition Goals:  Nutrition Therapy & Goals - 02/27/24 1018       Nutrition Therapy   Diet Heart Healthy Diet      Personal Nutrition Goals   Nutrition Goal Patient to identify strategies for reducing cardiovascular risk by attending the [employer] education and nutrition series weekly.   goal not met.   Personal Goal #2 Patient to improve diet quality by using the plate method as a guide for meal planning to include lean protein/plant protein, fruits, vegetables, whole grains, nonfat dairy as part of a well-balanced diet.   goal in progress.   Comments Goals in progress. Carley has medical history of pulmonary HTN, HFpEF, s/p mitraclip, hx gi bleed, thrombocytosis (hydroxurea). She does not attend the education  component of cariac rehab. She reports that her husband recently had a stroke and he is working on weight gain. She admits to additional stress carrying for him and is eating out more frequently. She is motivated to improve eating habits and opt for convenience cooking methods. 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 Patient will benefit from participation in intensive cardiac rehab for nutrition, exercise, and lifestyle modification.      Intervention Plan   Intervention Prescribe, educate and counsel regarding individualized  specific dietary modifications aiming towards targeted core components such as weight, hypertension, lipid management, diabetes, heart failure and other comorbidities.;Nutrition handout(s) given to patient.    Expected Outcomes Short Term Goal: Understand basic principles of dietary content, such as calories, fat, sodium, cholesterol and nutrients.;Long Term Goal: Adherence to prescribed nutrition plan.          Nutrition Assessments:  MEDIFICTS Score Key: >=70 Need to make dietary changes  40-70 Heart Healthy Diet <= 40 Therapeutic Level Cholesterol Diet    Picture Your Plate Scores: <59 Unhealthy dietary pattern with much room for improvement. 41-50 Dietary pattern unlikely to meet recommendations for good health and room for improvement. 51-60 More healthful dietary pattern, with some room for improvement.  >60 Healthy dietary pattern, although there may be some specific behaviors that could be improved.    Nutrition Goals Re-Evaluation:  Nutrition Goals Re-Evaluation     Row Name 12/28/23 1138 02/01/24 1021 02/27/24 1018         Goals   Current Weight 143 lb 8.3 oz (65.1 kg) 143 lb 4.8 oz (65 kg) 142 lb 6.7 oz (64.6 kg)     Expected Outcome Trish has medical history of pulmonary HTN, HFpEF, s/p mitraclip, hx gi bleed, thrombocytosis (hydroxurea). She reports that her husband recently had a stroke and is working on weight gain. She admits to additional stress carrying for him and is eating out more frequently. She is motivated to improve eating habits and opt for convenience cooking methods. She continues regular follow-up with oncology related to iron  deficiency, thrombocytosis. Patient will benefit from participation in intensive cardiac rehab for nutrition, exercise, and lifestyle modification. Goals in progress. Carley has medical history of pulmonary HTN, HFpEF, s/p mitraclip, hx gi bleed, thrombocytosis (hydroxurea). She reports that her husband recently had a stroke and is  working on weight gain. She admits to additional stress carrying for him and is eating out more frequently. She is motivated to improve eating habits and opt for convenience cooking methods. She continues regular follow-up with oncology related to iron  deficiency, thrombocytosis. She does not regularly attend the [employer] education/nutrition series. She has maintained her weight since starting with our program and BP has been well controlled. Patient will benefit from participation in intensive cardiac rehab for nutrition, exercise, and lifestyle modification. Goals in progress. Carley has medical history of pulmonary HTN, HFpEF, s/p mitraclip, hx gi bleed, thrombocytosis (hydroxurea). She does not attend the education component of cariac rehab. She reports that her husband recently had a stroke and he is working on weight gain. She admits to additional stress carrying for him and is eating out more frequently. She is motivated to improve eating habits and opt for convenience cooking methods. 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 Patient will benefit  from participation in intensive cardiac rehab for nutrition, exercise, and lifestyle modification.        Nutrition Goals Re-Evaluation:  Nutrition Goals Re-Evaluation     Row Name 12/28/23 1138 02/01/24 1021 02/27/24 1018         Goals   Current Weight 143 lb 8.3 oz (65.1 kg) 143 lb 4.8 oz (65 kg) 142 lb 6.7 oz (64.6 kg)     Expected Outcome Trish has medical history of pulmonary HTN, HFpEF, s/p mitraclip, hx gi bleed, thrombocytosis (hydroxurea). She reports that her husband recently had a stroke and is working on weight gain. She admits to additional stress carrying for him and is eating out more frequently. She is motivated to improve eating habits and opt for convenience cooking methods. She continues regular  follow-up with oncology related to iron  deficiency, thrombocytosis. Patient will benefit from participation in intensive cardiac rehab for nutrition, exercise, and lifestyle modification. Goals in progress. Carley has medical history of pulmonary HTN, HFpEF, s/p mitraclip, hx gi bleed, thrombocytosis (hydroxurea). She reports that her husband recently had a stroke and is working on weight gain. She admits to additional stress carrying for him and is eating out more frequently. She is motivated to improve eating habits and opt for convenience cooking methods. She continues regular follow-up with oncology related to iron  deficiency, thrombocytosis. She does not regularly attend the [employer] education/nutrition series. She has maintained her weight since starting with our program and BP has been well controlled. Patient will benefit from participation in intensive cardiac rehab for nutrition, exercise, and lifestyle modification. Goals in progress. Carley has medical history of pulmonary HTN, HFpEF, s/p mitraclip, hx gi bleed, thrombocytosis (hydroxurea). She does not attend the education component of cariac rehab. She reports that her husband recently had a stroke and he is working on weight gain. She admits to additional stress carrying for him and is eating out more frequently. She is motivated to improve eating habits and opt for convenience cooking methods. 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 Patient will benefit from participation in intensive cardiac rehab for nutrition, exercise, and lifestyle modification.        Nutrition Goals Discharge (Final Nutrition Goals Re-Evaluation):  Nutrition Goals Re-Evaluation - 02/27/24 1018       Goals   Current Weight 142 lb 6.7 oz (64.6 kg)    Expected Outcome Goals in progress. 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 She does not attend the education component of cariac rehab. She reports that her husband recently had a stroke and he is working on weight gain. She admits to additional stress carrying for him and is eating out more frequently. She is motivated to improve eating habits and opt for convenience cooking methods. 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 Patient will benefit from participation in intensive cardiac rehab for nutrition, exercise, and lifestyle modification.          Psychosocial: Target Goals: Acknowledge presence or absence of significant depression and/or stress, maximize coping skills, provide positive support system. Participant is able to verbalize types and ability to use techniques and skills needed for reducing stress and depression.  Initial Review & Psychosocial Screening:  Initial Psych Review & Screening - 12/20/23 1617  Initial Review   Current issues with Current Sleep Concerns;Current Stress Concerns    Source of Stress Concerns Chronic Illness;Family;Financial;Unable to perform yard/household activities    Comments She is feeling some stress with her family needs and issues with chronic fatigue. She's having a hard time with stamina to keep up an active lifestyle. Not interested in counseling at this time, but she knows to ask any time for additional resources      Family Dynamics   Good Support System? Yes      Barriers   Psychosocial barriers to participate in program The patient should benefit from training in stress management and relaxation.;Psychosocial barriers identified (see note)      Screening Interventions   Interventions Encouraged to exercise;To provide support and resources with identified psychosocial needs;Provide feedback about the scores  to participant    Expected Outcomes Long Term Goal: Stressors or current issues are controlled or eliminated.;Short Term goal: Identification and review with participant of any Quality of Life or Depression concerns found by scoring the questionnaire.;Long Term goal: The participant improves quality of Life and PHQ9 Scores as seen by post scores and/or verbalization of changes          Quality of Life Scores:  Quality of Life - 12/20/23 1621       Quality of Life   Select Quality of Life      Quality of Life Scores   Health/Function Pre 18.03 %    Socioeconomic Pre 25.25 %    Psych/Spiritual Pre 16.5 %    Family Pre 21 %    GLOBAL Pre 19.42 %         Scores of 19 and below usually indicate a poorer quality of life in these areas.  A difference of  2-3 points is a clinically meaningful difference.  A difference of 2-3 points in the total score of the Quality of Life Index has been associated with significant improvement in overall quality of life, self-image, physical symptoms, and general health in studies assessing change in quality of life.  PHQ-9: Review Flowsheet  More data exists      12/20/2023 10/18/2023 09/06/2023 08/09/2023 06/26/2023  Depression screen PHQ 2/9  Decreased Interest 0 0 0 0 0  Down, Depressed, Hopeless 0 0 0 0 0  PHQ - 2 Score 0 0 0 0 0  Altered sleeping 2 - - - -  Tired, decreased energy 3 - - - -  Change in appetite 0 - - - -  Feeling bad or failure about yourself  0 - - - -  Trouble concentrating 0 - - - -  Moving slowly or fidgety/restless 0 - - - -  Suicidal thoughts 0 - - - -  PHQ-9 Score 5 - - - -  Difficult doing work/chores Somewhat difficult - - - -   Interpretation of Total Score  Total Score Depression Severity:  1-4 = Minimal depression, 5-9 = Mild depression, 10-14 = Moderate depression, 15-19 = Moderately severe depression, 20-27 = Severe depression   Psychosocial Evaluation and Intervention:   Psychosocial Re-Evaluation:   Psychosocial Re-Evaluation     Row Name 12/31/23 0839 01/03/24 1040 01/15/24 1310 02/11/24 1844 03/04/24 1702     Psychosocial Re-Evaluation   Current issues with Current Stress Concerns;Current Sleep Concerns Current Stress Concerns;Current Sleep Concerns Current Stress Concerns;Current Sleep Concerns Current Stress Concerns;Current Sleep Concerns Current Stress Concerns;Current Sleep Concerns   Comments Trish did not voice any increased concerns or stressors on her first  day of exercise. Wll review quality of life and PHQ2-9 and quality of life in the upcoming week Reviewed quality of life and PHQ2-9.Carley denies being depressed currently. Carley says she was initially frightened after having open heart surgery. Carley says she is now feeling better and has a little more energy since starting exercise at cardiac rehab.Carley said she had home health and found it to be intrusive. Trish's husband has an appointment to be evaluated for neuro rehab. Carley says she sometimes has difficulty staying asleep. Discussed sleep hygiene methods. Carley is pleased with her husbands appointment with neuor rehab as he will be getting therapy twice a week. Carley continues to juggle her own recovery post mitrl valve surgery and taking care of her husband Carley continues to manage taking care of her husband. Trish reports she has been feeling better since she has been participating in cardiac rehab   Expected Outcomes Trish will have controlled or decreased stressors upon compleiton of cardiac rehab Trish will have controlled or decreased stressors upon compleiton of cardiac rehab Trish will have controlled or decreased stressors upon compleiton of cardiac rehab Trish will have controlled or decreased stressors upon compleiton of cardiac rehab Trish will have controlled or decreased stressors upon compleiton of cardiac rehab   Interventions Stress management education;Encouraged to attend Cardiac Rehabilitation for the  exercise;Relaxation education Stress management education;Encouraged to attend Cardiac Rehabilitation for the exercise;Relaxation education Stress management education;Encouraged to attend Cardiac Rehabilitation for the exercise;Relaxation education Stress management education;Encouraged to attend Cardiac Rehabilitation for the exercise;Relaxation education Stress management education;Encouraged to attend Cardiac Rehabilitation for the exercise;Relaxation education   Continue Psychosocial Services  Follow up required by staff Follow up required by staff Follow up required by staff Follow up required by staff Follow up required by staff     Initial Review   Source of Stress Concerns Family;Chronic Illness Family;Chronic Illness Family;Chronic Illness Family;Chronic Illness Family;Chronic Illness   Comments Will continue to monitor and offer support as needed Will continue to monitor and offer support as needed Will continue to monitor and offer support as needed Will continue to monitor and offer support as needed Will continue to monitor and offer support as needed      Psychosocial Discharge (Final Psychosocial Re-Evaluation):  Psychosocial Re-Evaluation - 03/04/24 1702       Psychosocial Re-Evaluation   Current issues with Current Stress Concerns;Current Sleep Concerns    Comments Carley continues to manage taking care of her husband. Trish reports she has been feeling better since she has been participating in cardiac rehab    Expected Outcomes Trish will have controlled or decreased stressors upon compleiton of cardiac rehab    Interventions Stress management education;Encouraged to attend Cardiac Rehabilitation for the exercise;Relaxation education    Continue Psychosocial Services  Follow up required by staff      Initial Review   Source of Stress Concerns Family;Chronic Illness    Comments Will continue to monitor and offer support as needed          Vocational  Rehabilitation: Provide vocational rehab assistance to qualifying candidates.   Vocational Rehab Evaluation & Intervention:  Vocational Rehab - 12/20/23 1529       Initial Vocational Rehab Evaluation & Intervention   Assessment shows need for Vocational Rehabilitation No   retired         Education: Education Goals: Education classes will be provided on a weekly basis, covering required topics. Participant will state understanding/return demonstration of topics presented.  Core Videos: Exercise    Move It!  Clinical staff conducted group or individual video education with verbal and written material and guidebook.  Patient learns the recommended Pritikin exercise program. Exercise with the goal of living a long, healthy life. Some of the health benefits of exercise include controlled diabetes, healthier blood pressure levels, improved cholesterol levels, improved heart and lung capacity, improved sleep, and better body composition. Everyone should speak with their doctor before starting or changing an exercise routine.  Biomechanical Limitations Clinical staff conducted group or individual video education with verbal and written material and guidebook.  Patient learns how biomechanical limitations can impact exercise and how we can mitigate and possibly overcome limitations to have an impactful and balanced exercise routine.  Body Composition Clinical staff conducted group or individual video education with verbal and written material and guidebook.  Patient learns that body composition (ratio of muscle mass to fat mass) is a key component to assessing overall fitness, rather than body weight alone. Increased fat mass, especially visceral belly fat, can put us  at increased risk for metabolic syndrome, type 2 diabetes, heart disease, and even death. It is recommended to combine diet and exercise (cardiovascular and resistance training) to improve your body composition. Seek guidance  from your physician and exercise physiologist before implementing an exercise routine.  Exercise Action Plan Clinical staff conducted group or individual video education with verbal and written material and guidebook.  Patient learns the recommended strategies to achieve and enjoy long-term exercise adherence, including variety, self-motivation, self-efficacy, and positive decision making. Benefits of exercise include fitness, good health, weight management, more energy, better sleep, less stress, and overall well-being.  Medical   Heart Disease Risk Reduction Clinical staff conducted group or individual video education with verbal and written material and guidebook.  Patient learns our heart is our most vital organ as it circulates oxygen, nutrients, white blood cells, and hormones throughout the entire body, and carries waste away. Data supports a plant-based eating plan like the Pritikin Program for its effectiveness in slowing progression of and reversing heart disease. The video provides a number of recommendations to address heart disease.   Metabolic Syndrome and Belly Fat  Clinical staff conducted group or individual video education with verbal and written material and guidebook.  Patient learns what metabolic syndrome is, how it leads to heart disease, and how one can reverse it and keep it from coming back. You have metabolic syndrome if you have 3 of the following 5 criteria: abdominal obesity, high blood pressure, high triglycerides, low HDL cholesterol, and high blood sugar.  Hypertension and Heart Disease Clinical staff conducted group or individual video education with verbal and written material and guidebook.  Patient learns that high blood pressure, or hypertension, is very common in the [place] . Hypertension is largely due to excessive salt intake, but other important risk factors include being overweight, physical inactivity, drinking too much alcohol, smoking, and not  eating enough potassium from fruits and vegetables. High blood pressure is a leading risk factor for heart attack, stroke, congestive heart failure, dementia, kidney failure, and premature death. Long-term effects of excessive salt intake include stiffening of the arteries and thickening of heart muscle and organ damage. Recommendations include ways to reduce hypertension and the risk of heart disease.  Diseases of Our Time - Focusing on Diabetes Clinical staff conducted group or individual video education with verbal and written material and guidebook.  Patient learns why the best way to stop diseases of our time  is prevention, through food and other lifestyle changes. Medicine (such as prescription pills and surgeries) is often only a Band-Aid on the problem, not a long-term solution. Most common diseases of our time include obesity, type 2 diabetes, hypertension, heart disease, and cancer. The Pritikin Program is recommended and has been proven to help reduce, reverse, and/or prevent the damaging effects of metabolic syndrome.  Nutrition   Overview of the Pritikin Eating Plan  Clinical staff conducted group or individual video education with verbal and written material and guidebook.  Patient learns about the Pritikin Eating Plan for disease risk reduction. The Pritikin Eating Plan emphasizes a wide variety of unrefined, minimally-processed carbohydrates, like fruits, vegetables, whole grains, and legumes. Go, Caution, and Stop food choices are explained. Plant-based and lean animal proteins are emphasized. Rationale provided for low sodium intake for blood pressure control, low added sugars for blood sugar stabilization, and low added fats and oils for coronary artery disease risk reduction and weight management.  Calorie Density  Clinical staff conducted group or individual video education with verbal and written material and guidebook.  Patient learns about calorie density and how it impacts the  Pritikin Eating Plan. Knowing the characteristics of the food you choose will help you decide whether those foods will lead to weight gain or weight loss, and whether you want to consume more or less of them. Weight loss is usually a side effect of the Pritikin Eating Plan because of its focus on low calorie-dense foods.  Label Reading  Clinical staff conducted group or individual video education with verbal and written material and guidebook.  Patient learns about the Pritikin recommended label reading guidelines and corresponding recommendations regarding calorie density, added sugars, sodium content, and whole grains.  Dining Out - Part 1  Clinical staff conducted group or individual video education with verbal and written material and guidebook.  Patient learns that restaurant meals can be sabotaging because they can be so high in calories, fat, sodium, and/or sugar. Patient learns recommended strategies on how to positively address this and avoid unhealthy pitfalls.  Facts on Fats  Clinical staff conducted group or individual video education with verbal and written material and guidebook.  Patient learns that lifestyle modifications can be just as effective, if not more so, as many medications for lowering your risk of heart disease. A Pritikin lifestyle can help to reduce your risk of inflammation and atherosclerosis (cholesterol build-up, or plaque, in the artery walls). Lifestyle interventions such as dietary choices and physical activity address the cause of atherosclerosis. A review of the types of fats and their impact on blood cholesterol levels, along with dietary recommendations to reduce fat intake is also included.  Nutrition Action Plan  Clinical staff conducted group or individual video education with verbal and written material and guidebook.  Patient learns how to incorporate Pritikin recommendations into their lifestyle. Recommendations include planning and keeping personal  health goals in mind as an important part of their success.  Healthy Mind-Set    Healthy Minds, Bodies, Hearts  Clinical staff conducted group or individual video education with verbal and written material and guidebook.  Patient learns how to identify when they are stressed. Video will discuss the impact of that stress, as well as the many benefits of stress management. Patient will also be introduced to stress management techniques. The way we think, act, and feel has an impact on our hearts.  How Our Thoughts Can Heal Our Hearts  Clinical staff conducted group or individual video  education with verbal and written material and guidebook.  Patient learns that negative thoughts can cause depression and anxiety. This can result in negative lifestyle behavior and serious health problems. Cognitive behavioral therapy is an effective method to help control our thoughts in order to change and improve our emotional outlook.  Additional Videos:  Exercise    Improving Performance  Clinical staff conducted group or individual video education with verbal and written material and guidebook.  Patient learns to use a non-linear approach by alternating intensity levels and lengths of time spent exercising to help burn more calories and lose more body fat. Cardiovascular exercise helps improve heart health, metabolism, hormonal balance, blood sugar control, and recovery from fatigue. Resistance training improves strength, endurance, balance, coordination, reaction time, metabolism, and muscle mass. Flexibility exercise improves circulation, posture, and balance. Seek guidance from your physician and exercise physiologist before implementing an exercise routine and learn your capabilities and proper form for all exercise.  Introduction to Yoga  Clinical staff conducted group or individual video education with verbal and written material and guidebook.  Patient learns about yoga, a discipline of the coming  together of mind, breath, and body. The benefits of yoga include improved flexibility, improved range of motion, better posture and core strength, increased lung function, weight loss, and positive self-image. Yoga's heart health benefits include lowered blood pressure, healthier heart rate, decreased cholesterol and triglyceride levels, improved immune function, and reduced stress. Seek guidance from your physician and exercise physiologist before implementing an exercise routine and learn your capabilities and proper form for all exercise.  Medical   Aging: Enhancing Your Quality of Life  Clinical staff conducted group or individual video education with verbal and written material and guidebook.  Patient learns key strategies and recommendations to stay in good physical health and enhance quality of life, such as prevention strategies, having an advocate, securing a Health Care Proxy and Power of Attorney, and keeping a list of medications and system for tracking them. It also discusses how to avoid risk for bone loss.  Biology of Weight Control  Clinical staff conducted group or individual video education with verbal and written material and guidebook.  Patient learns that weight gain occurs because we consume more calories than we burn (eating more, moving less). Even if your body weight is normal, you may have higher ratios of fat compared to muscle mass. Too much body fat puts you at increased risk for cardiovascular disease, heart attack, stroke, type 2 diabetes, and obesity-related cancers. In addition to exercise, following the Pritikin Eating Plan can help reduce your risk.  Decoding Lab Results  Clinical staff conducted group or individual video education with verbal and written material and guidebook.  Patient learns that lab test reflects one measurement whose values change over time and are influenced by many factors, including medication, stress, sleep, exercise, food, hydration,  pre-existing medical conditions, and more. It is recommended to use the knowledge from this video to become more involved with your lab results and evaluate your numbers to speak with your doctor.   Diseases of Our Time - Overview  Clinical staff conducted group or individual video education with verbal and written material and guidebook.  Patient learns that according to the CDC, 50% to 70% of chronic diseases (such as obesity, type 2 diabetes, elevated lipids, hypertension, and heart disease) are avoidable through lifestyle improvements including healthier food choices, listening to satiety cues, and increased physical activity.  Sleep Disorders Clinical staff conducted group or individual  video education with verbal and written material and guidebook.  Patient learns how good quality and duration of sleep are important to overall health and well-being. Patient also learns about sleep disorders and how they impact health along with recommendations to address them, including discussing with a physician.  Nutrition  Dining Out - Part 2 Clinical staff conducted group or individual video education with verbal and written material and guidebook.  Patient learns how to plan ahead and communicate in order to maximize their dining experience in a healthy and nutritious manner. Included are recommended food choices based on the type of restaurant the patient is visiting.   Fueling a Banker conducted group or individual video education with verbal and written material and guidebook.  There is a strong connection between our food choices and our health. Diseases like obesity and type 2 diabetes are very prevalent and are in large-part due to lifestyle choices. The Pritikin Eating Plan provides plenty of food and hunger-curbing satisfaction. It is easy to follow, affordable, and helps reduce health risks.  Menu Workshop  Clinical staff conducted group or individual video education  with verbal and written material and guidebook.  Patient learns that restaurant meals can sabotage health goals because they are often packed with calories, fat, sodium, and sugar. Recommendations include strategies to plan ahead and to communicate with the manager, chef, or server to help order a healthier meal.  Planning Your Eating Strategy  Clinical staff conducted group or individual video education with verbal and written material and guidebook.  Patient learns about the Pritikin Eating Plan and its benefit of reducing the risk of disease. The Pritikin Eating Plan does not focus on calories. Instead, it emphasizes high-quality, nutrient-rich foods. By knowing the characteristics of the foods, we choose, we can determine their calorie density and make informed decisions.  Targeting Your Nutrition Priorities  Clinical staff conducted group or individual video education with verbal and written material and guidebook.  Patient learns that lifestyle habits have a tremendous impact on disease risk and progression. This video provides eating and physical activity recommendations based on your personal health goals, such as reducing LDL cholesterol, losing weight, preventing or controlling type 2 diabetes, and reducing high blood pressure.  Vitamins and Minerals  Clinical staff conducted group or individual video education with verbal and written material and guidebook.  Patient learns different ways to obtain key vitamins and minerals, including through a recommended healthy diet. It is important to discuss all supplements you take with your doctor.   Healthy Mind-Set    Smoking Cessation  Clinical staff conducted group or individual video education with verbal and written material and guidebook.  Patient learns that cigarette smoking and tobacco addiction pose a serious health risk which affects millions of people. Stopping smoking will significantly reduce the risk of heart disease, lung disease,  and many forms of cancer. Recommended strategies for quitting are covered, including working with your doctor to develop a successful plan.  Culinary   Becoming a Set designer conducted group or individual video education with verbal and written material and guidebook.  Patient learns that cooking at home can be healthy, cost-effective, quick, and puts them in control. Keys to cooking healthy recipes will include looking at your recipe, assessing your equipment needs, planning ahead, making it simple, choosing cost-effective seasonal ingredients, and limiting the use of added fats, salts, and sugars.  Cooking - Breakfast and Snacks  Clinical staff conducted group or individual  video education with verbal and written material and guidebook.  Patient learns how important breakfast is to satiety and nutrition through the entire day. Recommendations include key foods to eat during breakfast to help stabilize blood sugar levels and to prevent overeating at meals later in the day. Planning ahead is also a key component.  Cooking - Educational psychologist conducted group or individual video education with verbal and written material and guidebook.  Patient learns eating strategies to improve overall health, including an approach to cook more at home. Recommendations include thinking of animal protein as a side on your plate rather than center stage and focusing instead on lower calorie dense options like vegetables, fruits, whole grains, and plant-based proteins, such as beans. Making sauces in large quantities to freeze for later and leaving the skin on your vegetables are also recommended to maximize your experience.  Cooking - Healthy Salads and Dressing Clinical staff conducted group or individual video education with verbal and written material and guidebook.  Patient learns that vegetables, fruits, whole grains, and legumes are the foundations of the Pritikin Eating Plan.  Recommendations include how to incorporate each of these in flavorful and healthy salads, and how to create homemade salad dressings. Proper handling of ingredients is also covered. Cooking - Soups and [employer] - Soups and Desserts Clinical staff conducted group or individual video education with verbal and written material and guidebook.  Patient learns that Pritikin soups and desserts make for easy, nutritious, and delicious snacks and meal components that are low in sodium, fat, sugar, and calorie density, while high in vitamins, minerals, and filling fiber. Recommendations include simple and healthy ideas for soups and desserts.   Overview     The Pritikin Solution Program Overview Clinical staff conducted group or individual video education with verbal and written material and guidebook.  Patient learns that the results of the Pritikin Program have been documented in more than 100 articles published in peer-reviewed journals, and the benefits include reducing risk factors for (and, in some cases, even reversing) high cholesterol, high blood pressure, type 2 diabetes, obesity, and more! An overview of the three key pillars of the Pritikin Program will be covered: eating well, doing regular exercise, and having a healthy mind-set.  WORKSHOPS  Exercise: Exercise Basics: Building Your Action Plan Clinical staff led group instruction and group discussion with PowerPoint presentation and patient guidebook. To enhance the learning environment the use of posters, models and videos may be added. At the conclusion of this workshop, patients will comprehend the difference between physical activity and exercise, as well as the benefits of incorporating both, into their routine. Patients will understand the FITT (Frequency, Intensity, Time, and Type) principle and how to use it to build an exercise action plan. In addition, safety concerns and other considerations for exercise and cardiac rehab  will be addressed by the presenter. The purpose of this lesson is to promote a comprehensive and effective weekly exercise routine in order to improve patients' overall level of fitness.   Managing Heart Disease: Your Path to a Healthier Heart Clinical staff led group instruction and group discussion with PowerPoint presentation and patient guidebook. To enhance the learning environment the use of posters, models and videos may be added.At the conclusion of this workshop, patients will understand the anatomy and physiology of the heart. Additionally, they will understand how Pritikin's three pillars impact the risk factors, the progression, and the management of heart disease.  The purpose of  this lesson is to provide a high-level overview of the heart, heart disease, and how the Pritikin lifestyle positively impacts risk factors.  Exercise Biomechanics Clinical staff led group instruction and group discussion with PowerPoint presentation and patient guidebook. To enhance the learning environment the use of posters, models and videos may be added. Patients will learn how the structural parts of their bodies function and how these functions impact their daily activities, movement, and exercise. Patients will learn how to promote a neutral spine, learn how to manage pain, and identify ways to improve their physical movement in order to promote healthy living. The purpose of this lesson is to expose patients to common physical limitations that impact physical activity. Participants will learn practical ways to adapt and manage aches and pains, and to minimize their effect on regular exercise. Patients will learn how to maintain good posture while sitting, walking, and lifting.  Balance Training and Fall Prevention  Clinical staff led group instruction and group discussion with PowerPoint presentation and patient guidebook. To enhance the learning environment the use of posters, models and videos  may be added. At the conclusion of this workshop, patients will understand the importance of their sensorimotor skills (vision, proprioception, and the vestibular system) in maintaining their ability to balance as they age. Patients will apply a variety of balancing exercises that are appropriate for their current level of function. Patients will understand the common causes for poor balance, possible solutions to these problems, and ways to modify their physical environment in order to minimize their fall risk. The purpose of this lesson is to teach patients about the importance of maintaining balance as they age and ways to minimize their risk of falling.  WORKSHOPS   Nutrition:  Fueling a Ship broker led group instruction and group discussion with PowerPoint presentation and patient guidebook. To enhance the learning environment the use of posters, models and videos may be added. Patients will review the foundational principles of the Pritikin Eating Plan and understand what constitutes a serving size in each of the food groups. Patients will also learn Pritikin-friendly foods that are better choices when away from home and review make-ahead meal and snack options. Calorie density will be reviewed and applied to three nutrition priorities: weight maintenance, weight loss, and weight gain. The purpose of this lesson is to reinforce (in a group setting) the key concepts around what patients are recommended to eat and how to apply these guidelines when away from home by planning and selecting Pritikin-friendly options. Patients will understand how calorie density may be adjusted for different weight management goals.  Mindful Eating  Clinical staff led group instruction and group discussion with PowerPoint presentation and patient guidebook. To enhance the learning environment the use of posters, models and videos may be added. Patients will briefly review the concepts of the Pritikin  Eating Plan and the importance of low-calorie dense foods. The concept of mindful eating will be introduced as well as the importance of paying attention to internal hunger signals. Triggers for non-hunger eating and techniques for dealing with triggers will be explored. The purpose of this lesson is to provide patients with the opportunity to review the basic principles of the Pritikin Eating Plan, discuss the value of eating mindfully and how to measure internal cues of hunger and fullness using the Hunger Scale. Patients will also discuss reasons for non-hunger eating and learn strategies to use for controlling emotional eating.  Targeting Your Nutrition Priorities Clinical staff led group instruction  and group discussion with PowerPoint presentation and patient guidebook. To enhance the learning environment the use of posters, models and videos may be added. Patients will learn how to determine their genetic susceptibility to disease by reviewing their family history. Patients will gain insight into the importance of diet as part of an overall healthy lifestyle in mitigating the impact of genetics and other environmental insults. The purpose of this lesson is to provide patients with the opportunity to assess their personal nutrition priorities by looking at their family history, their own health history and current risk factors. Patients will also be able to discuss ways of prioritizing and modifying the Pritikin Eating Plan for their highest risk areas  Menu  Clinical staff led group instruction and group discussion with PowerPoint presentation and patient guidebook. To enhance the learning environment the use of posters, models and videos may be added. Using menus brought in from [employer], or printed from [employer], patients will apply the Pritikin dining out guidelines that were presented in the [employer] video. Patients will also be able to practice these guidelines  in a variety of provided scenarios. The purpose of this lesson is to provide patients with the opportunity to practice hands-on learning of the Pritikin Dining Out guidelines with actual menus and practice scenarios.  Label Reading Clinical staff led group instruction and group discussion with PowerPoint presentation and patient guidebook. To enhance the learning environment the use of posters, models and videos may be added. Patients will review and discuss the Pritikin label reading guidelines presented in Pritikin's Label Reading Educational series video. Using fool labels brought in from local grocery stores and markets, patients will apply the label reading guidelines and determine if the packaged food meet the Pritikin guidelines. The purpose of this lesson is to provide patients with the opportunity to review, discuss, and practice hands-on learning of the Pritikin Label Reading guidelines with actual packaged food labels. Cooking School  Pritikin's [employer] are designed to teach patients ways to prepare quick, simple, and affordable recipes at home. The importance of nutrition's role in chronic disease risk reduction is reflected in its emphasis in the overall Pritikin program. By learning how to prepare essential core Pritikin Eating Plan recipes, patients will increase control over what they eat; be able to customize the flavor of foods without the use of added salt, sugar, or fat; and improve the quality of the food they consume. By learning a set of core recipes which are easily assembled, quickly prepared, and affordable, patients are more likely to prepare more healthy foods at home. These workshops focus on convenient breakfasts, simple entres, side dishes, and desserts which can be prepared with minimal effort and are consistent with nutrition recommendations for cardiovascular risk reduction. Cooking [employer] are taught by a Armed forces logistics/support/administrative officer (RD) who has  been trained by the [employer]. The chef or RD has a clear understanding of the importance of minimizing - if not completely eliminating - added fat, sugar, and sodium in recipes. Throughout the series of Cooking School Workshop sessions, patients will learn about healthy ingredients and efficient methods of cooking to build confidence in their capability to prepare    Cooking School weekly topics:  Adding Flavor- Sodium-Free  Fast and Healthy Breakfasts  Powerhouse Plant-Based Proteins  Satisfying Salads and Dressings  Simple Sides and Sauces  International Cuisine-Spotlight on the [employer] Zones  Delicious Desserts  Savory Soups  [employer] - Meals in  a Snap  Tasty Appetizers and Snacks  Comforting Weekend Breakfasts  One-Pot Wonders   Fast Evening Meals  Landscape architect Your Pritikin Plate  WORKSHOPS   Healthy Mindset (Psychosocial):  Focused Goals, Sustainable Changes Clinical staff led group instruction and group discussion with PowerPoint presentation and patient guidebook. To enhance the learning environment the use of posters, models and videos may be added. Patients will be able to apply effective goal setting strategies to establish at least one personal goal, and then take consistent, meaningful action toward that goal. They will learn to identify common barriers to achieving personal goals and develop strategies to overcome them. Patients will also gain an understanding of how our mind-set can impact our ability to achieve goals and the importance of cultivating a positive and growth-oriented mind-set. The purpose of this lesson is to provide patients with a deeper understanding of how to set and achieve personal goals, as well as the tools and strategies needed to overcome common obstacles which may arise along the way.  From Head to Heart: The Power of a Healthy Outlook  Clinical staff led group instruction and group discussion with  PowerPoint presentation and patient guidebook. To enhance the learning environment the use of posters, models and videos may be added. Patients will be able to recognize and describe the impact of emotions and mood on physical health. They will discover the importance of self-care and explore self-care practices which may work for them. Patients will also learn how to utilize the 4 C's to cultivate a healthier outlook and better manage stress and challenges. The purpose of this lesson is to demonstrate to patients how a healthy outlook is an essential part of maintaining good health, especially as they continue their cardiac rehab journey.  Healthy Sleep for a Healthy Heart Clinical staff led group instruction and group discussion with PowerPoint presentation and patient guidebook. To enhance the learning environment the use of posters, models and videos may be added. At the conclusion of this workshop, patients will be able to demonstrate knowledge of the importance of sleep to overall health, well-being, and quality of life. They will understand the symptoms of, and treatments for, common sleep disorders. Patients will also be able to identify daytime and nighttime behaviors which impact sleep, and they will be able to apply these tools to help manage sleep-related challenges. The purpose of this lesson is to provide patients with a general overview of sleep and outline the importance of quality sleep. Patients will learn about a few of the most common sleep disorders. Patients will also be introduced to the concept of "sleep hygiene," and discover ways to self-manage certain sleeping problems through simple daily behavior changes. Finally, the workshop will motivate patients by clarifying the links between quality sleep and their goals of heart-healthy living.   Recognizing and Reducing Stress Clinical staff led group instruction and group discussion with PowerPoint presentation and patient guidebook. To  enhance the learning environment the use of posters, models and videos may be added. At the conclusion of this workshop, patients will be able to understand the types of stress reactions, differentiate between acute and chronic stress, and recognize the impact that chronic stress has on their health. They will also be able to apply different coping mechanisms, such as reframing negative self-talk. Patients will have the opportunity to practice a variety of stress management techniques, such as deep abdominal breathing, progressive muscle relaxation, and/or guided imagery.  The purpose of this lesson is to educate  patients on the role of stress in their lives and to provide healthy techniques for coping with it.  Learning Barriers/Preferences:  Learning Barriers/Preferences - 12/20/23 1622       Learning Barriers/Preferences   Learning Barriers Exercise Concerns   Patient has had 2 falls in the past year. But she did do well on her 30 second single leg stand. She achieved 30 seconds   Learning Preferences Written Material;Group Instruction;Computer/Internet;Individual Instruction          Education Topics:  Knowledge Questionnaire Score:  Knowledge Questionnaire Score - 12/20/23 1623       Knowledge Questionnaire Score   Pre Score 23/24          Core Components/Risk Factors/Patient Goals at Admission:  Personal Goals and Risk Factors at Admission - 12/20/23 1531       Core Components/Risk Factors/Patient Goals on Admission    Weight Management Yes    Intervention Weight Management: Develop a combined nutrition and exercise program designed to reach desired caloric intake, while maintaining appropriate intake of nutrient and fiber, sodium and fats, and appropriate energy expenditure required for the weight goal.;Weight Management: Provide education and appropriate resources to help participant work on and attain dietary goals.    Expected Outcomes Short Term: Continue to assess and  modify interventions until short term weight is achieved;Long Term: Adherence to nutrition and physical activity/exercise program aimed toward attainment of established weight goal;Understanding recommendations for meals to include 15-35% energy as protein, 25-35% energy from fat, 35-60% energy from carbohydrates, less than 200mg  of dietary cholesterol, 20-35 gm of total fiber daily;Understanding of distribution of calorie intake throughout the day with the consumption of 4-5 meals/snacks    Heart Failure Yes    Intervention Provide a combined exercise and nutrition program that is supplemented with education, support and counseling about heart failure. Directed toward relieving symptoms such as shortness of breath, decreased exercise tolerance, and extremity edema.    Expected Outcomes Improve functional capacity of life;Short term: Attendance in program 2-3 days a week with increased exercise capacity. Reported lower sodium intake. Reported increased fruit and vegetable intake. Reports medication compliance.;Short term: Daily weights obtained and reported for increase. Utilizing diuretic protocols set by physician.;Long term: Adoption of self-care skills and reduction of barriers for early signs and symptoms recognition and intervention leading to self-care maintenance.    Hypertension Yes    Intervention Provide education on lifestyle modifcations including regular physical activity/exercise, weight management, moderate sodium restriction and increased consumption of fresh fruit, vegetables, and low fat dairy, alcohol moderation, and smoking cessation.;Monitor prescription use compliance.    Expected Outcomes Long Term: Maintenance of blood pressure at goal levels.;Short Term: Continued assessment and intervention until BP is < 140/20mm HG in hypertensive participants. < 130/39mm HG in hypertensive participants with diabetes, heart failure or chronic kidney disease.    Lipids Yes    Intervention Provide  education and support for participant on nutrition & aerobic/resistive exercise along with prescribed medications to achieve LDL 70mg , HDL >40mg .    Expected Outcomes Short Term: Participant states understanding of desired cholesterol values and is compliant with medications prescribed. Participant is following exercise prescription and nutrition guidelines.;Long Term: Cholesterol controlled with medications as prescribed, with individualized exercise RX and with personalized nutrition plan. Value goals: LDL < 70mg , HDL > 40 mg.    Stress Yes    Intervention Offer individual and/or small group education and counseling on adjustment to heart disease, stress management and health-related lifestyle change. Teach and support  self-help strategies.;Refer participants experiencing significant psychosocial distress to appropriate mental health specialists for further evaluation and treatment. When possible, include family members and significant others in education/counseling sessions.    Expected Outcomes Long Term: Emotional wellbeing is indicated by absence of clinically significant psychosocial distress or social isolation.;Short Term: Participant demonstrates changes in health-related behavior, relaxation and other stress management skills, ability to obtain effective social support, and compliance with psychotropic medications if prescribed.    Personal Goal Other Yes    Personal Goal Patient wants to know limits to exercise, increase strength, stamina. To feel safe getting a good workout    Intervention Will continue to monitor pt and progress workloads as tolerated without sign or symptom    Expected Outcomes Pt will achieve her goals and gain strength          Core Components/Risk Factors/Patient Goals Review:   Goals and Risk Factor Review     Row Name 01/15/24 1316 02/11/24 1846 03/04/24 1706         Core Components/Risk Factors/Patient Goals Review   Personal Goals Review Weight  Management/Obesity;Stress Weight Management/Obesity;Stress Weight Management/Obesity;Stress     Review Carley is off to a good start to exercise at cardiac rehab. resting systolic BP's have been in the 90's. Carley has been asymptonmatic and encouraged to drink more water as Carley is not taking any blood pressure medications at this time resting systolic BP's remains in the 90's. Trish returned to exercis on 02/11/24 after complaining of feeling lightheaded. no reports of symptoms will continue to monitor, resting systolic BP's remains in the 90's. Trish remains asymptomatic with exercise. Vital signs have been stable. Carley has increased her workloads.     Expected Outcomes Carley will continue to participate in cardiac rehab for exercise, nutrition and lifestyle modifications Carley will continue to participate in cardiac rehab for exercise, nutrition and lifestyle modifications Carley will continue to participate in cardiac rehab for exercise, nutrition and lifestyle modifications        Core Components/Risk Factors/Patient Goals at Discharge (Final Review):   Goals and Risk Factor Review - 03/04/24 1706       Core Components/Risk Factors/Patient Goals Review   Personal Goals Review Weight Management/Obesity;Stress    Review resting systolic BP's remains in the 90's. Trish remains asymptomatic with exercise. Vital signs have been stable. Carley has increased her workloads.    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 Introduction to pritikin education/intensive cardiac rehab. Initial orientation packet reviewed with patient. Navah completed first session of exercise in the cardiac rehab program well without symptoms. 30 Day ITP Review. Darria Corvera is off to a good start ot exercise at cardiac rehab. 30 Day ITP Review. Samayra Hebel has good participation with exercise at cardiac rehab. Patient retrned to exercise after being out due to complaints of feeling lightheaded 30 Day ITP Review. Ginny Loomer has good participation and attendance with exercise at cardiac rehab.      Comments: See ITP Comments

## 2024-03-11 NOTE — Patient Instructions (Signed)
 Medication Instructions:  Your physician recommends that you continue on your current medications as directed. Please refer to the Current Medication list given to you today.  *If you need a refill on your cardiac medications before your next appointment, please call your pharmacy*  Lab Work: None ordered  If you have labs (blood work) drawn today and your tests are completely normal, you will receive your results only by: MyChart Message (if you have MyChart) OR A paper copy in the mail If you have any lab test that is abnormal or we need to change your treatment, we will call you to review the results.  Testing/Procedures: None ordered  Follow-Up: At [facility] you and your health needs are our priority.  As part of our continuing mission to provide you with exceptional heart care, our providers are all part of one team.  This team includes your primary Cardiologist (physician) and Advanced Practice Providers or APPs (Physician Assistants and Nurse Practitioners) who all work together to provide you with the care you need, when you need it.  Your next appointment:   6 MO F/U DR. THUKKANI  1 YEAR F/U DR. HILTY   Provider:        We recommend signing up for the patient portal called MyChart.  Sign up information is provided on this After Visit Summary.  MyChart is used to connect with patients for Virtual Visits (Telemedicine).  Patients are able to view lab/test results, encounter notes, upcoming appointments, etc.  Non-urgent messages can be sent to your provider as well.   To learn more about what you can do with MyChart, go to [email or web address].   Other Instructions

## 2024-03-12 ENCOUNTER — Encounter (HOSPITAL_COMMUNITY)
Admission: RE | Admit: 2024-03-12 | Discharge: 2024-03-12 | Disposition: A | Discharge status: Home / Self Care | Source: Ambulatory Visit | Attending: Internal Medicine

## 2024-03-12 ENCOUNTER — Ambulatory Visit: Admitting: Dermatology

## 2024-03-12 DIAGNOSIS — Z9889 Other specified postprocedural states: Secondary | ICD-10-CM

## 2024-03-14 ENCOUNTER — Encounter (HOSPITAL_COMMUNITY)
Admission: RE | Admit: 2024-03-14 | Discharge: 2024-03-14 | Disposition: A | Discharge status: Home / Self Care | Source: Ambulatory Visit | Attending: Internal Medicine | Admitting: Internal Medicine

## 2024-03-14 DIAGNOSIS — Z9889 Other specified postprocedural states: Secondary | ICD-10-CM | POA: Diagnosis not present

## 2024-03-17 ENCOUNTER — Encounter (HOSPITAL_COMMUNITY)
Admission: RE | Admit: 2024-03-17 | Discharge: 2024-03-17 | Disposition: A | Discharge status: Home / Self Care | Source: Ambulatory Visit | Attending: Internal Medicine | Admitting: Internal Medicine

## 2024-03-17 DIAGNOSIS — Z9889 Other specified postprocedural states: Secondary | ICD-10-CM | POA: Diagnosis not present

## 2024-03-19 ENCOUNTER — Ambulatory Visit (INDEPENDENT_AMBULATORY_CARE_PROVIDER_SITE_OTHER)

## 2024-03-19 ENCOUNTER — Encounter (HOSPITAL_COMMUNITY): Admission: RE | Admit: 2024-03-19 | Source: Ambulatory Visit

## 2024-03-19 ENCOUNTER — Ambulatory Visit (INDEPENDENT_AMBULATORY_CARE_PROVIDER_SITE_OTHER): Admitting: Podiatry

## 2024-03-19 DIAGNOSIS — M722 Plantar fascial fibromatosis: Secondary | ICD-10-CM

## 2024-03-19 DIAGNOSIS — M7732 Calcaneal spur, left foot: Secondary | ICD-10-CM

## 2024-03-19 NOTE — Progress Notes (Signed)
 Today with complaint of pain at the left heel.  Is been bothering for several weeks.  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   Physical exam:  General appearance: Pleasant, and in no acute distress. AOx3.  Vascular: Pedal pulses: DP 2/4 bilaterally, PT 2/4 bilaterally. No edema lower legs bilaterally. Capillary fill time immediate bilateral.  Neurological: Light touch intact feet bilaterally.  Normal Achilles reflex bilaterally.  No clonus or spasticity noted.  Negative Tinel's sign tarsal tunnel porta pedis bilaterally  Dermatologic:   Skin normal temperature bilaterally.  Skin normal color, tone, and texture bilaterally.   Musculoskeletal: Plantar medial aspect heel left at the plantar calcaneal tubercle.  No tenderness along compression of calcaneus.  Radiographs: 3 views foot left: Osteophytic changes posterior aspect calcaneus and very mild changes on the plantar aspect of the calcaneus.  No erosive changes.  No evidence of stress fractures.  Kagar's triangle normal  Diagnosis: 1.  Plantar fasciitis left 2.  Calcaneal spur left.  Plan: -Discussed with her plantar fasciitis etiology and treatment.  Discussed proper shoes to wear.  Will give her stretching exercises. -injected 3cc 2:1 mixture 0.5 cc Marcaine:Kenolog 10mg /67ml at medial plantar calcaneal tubercle origin of plantar fascia..   -Dispensed OTC inserts for shoes     Return 2 weeks follow-up injection plantar fascia left

## 2024-03-19 NOTE — Patient Instructions (Signed)

## 2024-03-20 ENCOUNTER — Telehealth: Payer: Self-pay | Admitting: Podiatry

## 2024-03-20 NOTE — Telephone Encounter (Signed)
 Patient came in today to speak to CMA or Doctor that she saw yesterday. It was lunch time so I told her that I could take a message. She had an injection yesterday. She is in cardiac phycical Therapy. The instructions she was given, states for her to take it easy for the next couple of days. She is needing to know if she can continue or what she may be limited to. Please give the patient a call.

## 2024-03-21 ENCOUNTER — Encounter (HOSPITAL_COMMUNITY): Admission: RE | Admit: 2024-03-21 | Source: Ambulatory Visit

## 2024-03-24 ENCOUNTER — Inpatient Hospital Stay

## 2024-03-24 ENCOUNTER — Inpatient Hospital Stay: Admitting: Hematology and Oncology

## 2024-03-24 ENCOUNTER — Encounter (HOSPITAL_COMMUNITY)
Admission: RE | Admit: 2024-03-24 | Discharge: 2024-03-24 | Disposition: A | Discharge status: Home / Self Care | Source: Ambulatory Visit | Attending: Internal Medicine

## 2024-03-24 DIAGNOSIS — Z9889 Other specified postprocedural states: Secondary | ICD-10-CM | POA: Diagnosis not present

## 2024-03-24 NOTE — Progress Notes (Signed)
 Rescheduled

## 2024-03-25 ENCOUNTER — Inpatient Hospital Stay

## 2024-03-25 DIAGNOSIS — D473 Essential (hemorrhagic) thrombocythemia: Secondary | ICD-10-CM | POA: Diagnosis not present

## 2024-03-25 LAB — CMP (CANCER CENTER ONLY)
ALT: 13 U/L (ref 0–44)
AST: 21 U/L (ref 15–41)
Albumin: 4.3 g/dL (ref 3.5–5.0)
Alkaline Phosphatase: 59 U/L (ref 38–126)
Anion gap: 3 — ABNORMAL LOW (ref 5–15)
BUN: 11 mg/dL (ref 8–23)
CO2: 31 mmol/L (ref 22–32)
Calcium: 9.9 mg/dL (ref 8.9–10.3)
Chloride: 105 mmol/L (ref 98–111)
Creatinine: 0.78 mg/dL (ref 0.44–1.00)
GFR, Estimated: >60 mL/min (ref >60)
Glucose, Bld: 97 mg/dL (ref 70–99)
Potassium: 4.3 mmol/L (ref 3.5–5.1)
Sodium: 139 mmol/L (ref 135–145)
Total Bilirubin: 0.6 mg/dL (ref 0.0–1.2)
Total Protein: 6.8 g/dL (ref 6.5–8.1)

## 2024-03-25 LAB — CBC WITH DIFFERENTIAL (CANCER CENTER ONLY)
Abs Immature Granulocytes: 0.01 K/uL (ref 0.00–0.07)
Basophils Absolute: 0 K/uL (ref 0.0–0.1)
Basophils Relative: 0 %
Eosinophils Absolute: 0 K/uL (ref 0.0–0.5)
Eosinophils Relative: 0 %
HCT: 32.8 % — ABNORMAL LOW (ref 36.0–46.0)
Hemoglobin: 11.2 g/dL — ABNORMAL LOW (ref 12.0–15.0)
Immature Granulocytes: 0 %
Lymphocytes Relative: 14 %
Lymphs Abs: 0.4 K/uL — ABNORMAL LOW (ref 0.7–4.0)
MCH: 45.2 pg — ABNORMAL HIGH (ref 26.0–34.0)
MCHC: 34.1 g/dL (ref 30.0–36.0)
MCV: 132.3 fL — ABNORMAL HIGH (ref 80.0–100.0)
Monocytes Absolute: 0.2 K/uL (ref 0.1–1.0)
Monocytes Relative: 6 %
Neutro Abs: 2.5 K/uL (ref 1.7–7.7)
Neutrophils Relative %: 80 %
Platelet Count: 353 K/uL (ref 150–400)
RBC: 2.48 MIL/uL — ABNORMAL LOW (ref 3.87–5.11)
RDW: 12.7 % (ref 11.5–15.5)
WBC Count: 3.2 K/uL — ABNORMAL LOW (ref 4.0–10.5)
nRBC: 0 % (ref 0.0–0.2)

## 2024-03-25 LAB — FERRITIN: Ferritin: 102 ng/mL (ref 11–307)

## 2024-03-26 ENCOUNTER — Encounter (HOSPITAL_COMMUNITY)
Admission: RE | Admit: 2024-03-26 | Discharge: 2024-03-26 | Disposition: A | Discharge status: Home / Self Care | Source: Ambulatory Visit | Attending: Internal Medicine | Admitting: Internal Medicine

## 2024-03-26 DIAGNOSIS — Z9889 Other specified postprocedural states: Secondary | ICD-10-CM | POA: Diagnosis not present

## 2024-03-28 ENCOUNTER — Encounter (HOSPITAL_COMMUNITY)
Admission: RE | Admit: 2024-03-28 | Discharge: 2024-03-28 | Disposition: A | Discharge status: Home / Self Care | Source: Ambulatory Visit | Attending: Internal Medicine | Admitting: Internal Medicine

## 2024-03-28 DIAGNOSIS — Z95818 Presence of other cardiac implants and grafts: Secondary | ICD-10-CM | POA: Insufficient documentation

## 2024-03-28 DIAGNOSIS — Z9889 Other specified postprocedural states: Secondary | ICD-10-CM | POA: Diagnosis present

## 2024-03-28 DIAGNOSIS — Z48812 Encounter for surgical aftercare following surgery on the circulatory system: Secondary | ICD-10-CM | POA: Diagnosis not present

## 2024-03-31 ENCOUNTER — Encounter (HOSPITAL_COMMUNITY)
Admission: RE | Admit: 2024-03-31 | Discharge: 2024-03-31 | Disposition: A | Discharge status: Home / Self Care | Source: Ambulatory Visit | Attending: Internal Medicine | Admitting: Internal Medicine

## 2024-03-31 DIAGNOSIS — Z48812 Encounter for surgical aftercare following surgery on the circulatory system: Secondary | ICD-10-CM | POA: Diagnosis not present

## 2024-03-31 DIAGNOSIS — Z9889 Other specified postprocedural states: Secondary | ICD-10-CM

## 2024-04-02 ENCOUNTER — Encounter (HOSPITAL_COMMUNITY)
Admission: RE | Admit: 2024-04-02 | Discharge: 2024-04-02 | Disposition: A | Discharge status: Home / Self Care | Source: Ambulatory Visit | Attending: Internal Medicine | Admitting: Internal Medicine

## 2024-04-02 DIAGNOSIS — Z48812 Encounter for surgical aftercare following surgery on the circulatory system: Secondary | ICD-10-CM | POA: Diagnosis not present

## 2024-04-02 DIAGNOSIS — Z9889 Other specified postprocedural states: Secondary | ICD-10-CM

## 2024-04-03 ENCOUNTER — Ambulatory Visit (INDEPENDENT_AMBULATORY_CARE_PROVIDER_SITE_OTHER): Payer: PRIVATE HEALTH INSURANCE | Admitting: Podiatry

## 2024-04-03 DIAGNOSIS — M722 Plantar fascial fibromatosis: Secondary | ICD-10-CM | POA: Diagnosis not present

## 2024-04-03 MED ORDER — TRIAMCINOLONE ACETONIDE 10 MG/ML IJ SUSP
10.0000 mg | Freq: Once | INTRAMUSCULAR | Status: AC
  Administered 2024-04-03: 10 mg

## 2024-04-03 NOTE — Progress Notes (Signed)
 Patient presents follow-up injection plantar fascia left.  Doing about 70 to 75% better.  Much less pain than before.   Physical exam:  General appearance: Pleasant, and in no acute distress. AOx3.  Vascular: Pedal pulses: DP 2/4 bilaterally, PT 2/4 bilaterally.  Mild edema lower legs bilaterally. Capillary fill time immediate bilaterally.  Neurological: Grossly intact bilaterally.  Negative Tinel's sign tarsal tunnel and porta pedis left  Dermatologic:   Skin normal temperature bilaterally.  Skin normal color, tone, and texture bilaterally.   Musculoskeletal: Tenderness plantar medial aspect heel left at medial plantar calcaneal tubercle.  Tenderness along the proximal centimeter several centimeters of the plantar fascia.   Diagnosis: 1 plantar fasciitis left  Plan: -Improved after injection #1.  Continue icing and stretching. -injected 3cc 2:1 mixture 0.5 cc Marcaine:Kenolog 10mg /13ml at origin plantar fascia medial calcaneal tubercle left.    Return 2 weeks f/u injection LT

## 2024-04-04 ENCOUNTER — Encounter (HOSPITAL_COMMUNITY)
Admission: RE | Admit: 2024-04-04 | Discharge: 2024-04-04 | Disposition: A | Discharge status: Home / Self Care | Source: Ambulatory Visit | Attending: Internal Medicine | Admitting: Internal Medicine

## 2024-04-04 DIAGNOSIS — Z9889 Other specified postprocedural states: Secondary | ICD-10-CM

## 2024-04-07 ENCOUNTER — Encounter (HOSPITAL_COMMUNITY)
Admission: RE | Admit: 2024-04-07 | Discharge: 2024-04-07 | Disposition: A | Discharge status: Home / Self Care | Source: Ambulatory Visit | Attending: Internal Medicine

## 2024-04-07 DIAGNOSIS — Z48812 Encounter for surgical aftercare following surgery on the circulatory system: Secondary | ICD-10-CM | POA: Diagnosis not present

## 2024-04-07 DIAGNOSIS — Z9889 Other specified postprocedural states: Secondary | ICD-10-CM

## 2024-04-08 ENCOUNTER — Telehealth: Payer: Self-pay | Admitting: Hematology and Oncology

## 2024-04-08 NOTE — Progress Notes (Signed)
 Cardiac Individual Treatment Plan  Patient Details  Name: Kristina Osborne MRN: [id number] Date of Birth: [birth year] Referring Provider:   Flowsheet Row INTENSIVE CARDIAC REHAB ORIENT from 12/20/2023 in [facility]  Referring Provider Vinie Maxcy, MD    Initial Encounter Date:  Flowsheet Row INTENSIVE CARDIAC REHAB ORIENT from 12/20/2023 in [facility]  Date 12/20/23    Visit Diagnosis: 11/28/23 mitraclip  Patient's Home Medications on Admission:  Current Outpatient Medications:    acetaminophen  (TYLENOL ) 500 MG tablet, Take 1,000 mg by mouth every 6 (six) hours as needed for headache (pain.)., Disp: , Rfl:    amoxicillin  (AMOXIL ) 500 MG tablet, Take 4 tablets (2,000 mg total) by mouth as directed. 1 hour prior to dental work including cleanings, Disp: 12 tablet, Rfl: 12   cholestyramine  light (PREVALITE ) 4 g packet, Take 2 g by mouth 2 (two) times daily., Disp: , Rfl:    clopidogrel  (PLAVIX ) 75 MG tablet, Take 1 tablet (75 mg total) by mouth daily with breakfast., Disp: 90 tablet, Rfl: 1   dicyclomine  (BENTYL ) 20 MG tablet, Take 1 tablet (20 mg total) by mouth 2 (two) times daily., Disp: 20 tablet, Rfl: 0   famotidine  (PEPCID ) 40 MG tablet, TAKE 1 TABLET(40 MG) BY MOUTH DAILY, Disp: 90 tablet, Rfl: 0   folic acid  (FOLVITE ) 1 MG tablet, TAKE 1 TABLET(1 MG) BY MOUTH DAILY, Disp: 90 tablet, Rfl: 1   furosemide  (LASIX ) 40 MG tablet, Take 1 tablet (40 mg total) by mouth as needed for fluid or edema. (Patient not taking: Reported on 04/03/2024), Disp: 90 tablet, Rfl: 3   Glucosamine-Chondroitin (COSAMIN DS PO), Take 1 tablet by mouth daily., Disp: , Rfl:    hydroxyurea  (HYDREA ) 500 MG capsule, Take 2 capsules (1,000 mg total) by mouth 2 (two) times daily. May take with food to minimize GI side effects., Disp: 120 capsule, Rfl: 1   Lycopene 10 MG CAPS, Take 10 mg by mouth daily.,  Disp: , Rfl:    sucralfate  (CARAFATE ) 1 g tablet, TAKE 1 TABLET(1 GRAM) BY MOUTH FOUR TIMES DAILY AT BEDTIME WITH MEALS (Patient taking differently: Take 1 g by mouth at bedtime.), Disp: 360 tablet, Rfl: 1  Past Medical History: Past Medical History:  Diagnosis Date   ABLA (acute blood loss anemia) 04/16/2022   Congenital malformation of intestinal fixation    Gastritis    Gastroesophageal reflux disease without esophagitis 09/10/2021   IDA (iron  deficiency anemia)    Mitral valve prolapse    Persistent hyperplasia of thymus (HCC)    S/P Thymectomy   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 Wendel   Thrombocytosis     Tobacco Use: Social History   Tobacco Use  Smoking Status Former   Current packs/day: 0.00   Types: Cigarettes   Quit date: 2002   Years since quitting: 23.6  Smokeless Tobacco Never  Tobacco Comments   Started smoking in her early 20's.   Smoked 0.5PP at her heaviest.     Labs: Review Flowsheet       Latest Ref Rng & Units 04/16/2022 10/24/2023 11/01/2023  Labs for ITP Cardiac and Pulmonary Rehab  Hemoglobin A1c 4.8 - 5.6 % 4.4  - -  PH, Arterial 7.35 - 7.45 - - 7.401   PCO2 arterial 32 - 48 mmHg - - 38.9  Bicarbonate 20.0 - 28.0 mmol/L - - 25.3  24.2   TCO2 22 - 32 mmol/L - 20  27  25    O2 Saturation % - - 73  92     Details       Multiple values from one day are sorted in reverse-chronological order         Capillary Blood Glucose: Lab Results  Component Value Date   GLUCAP 131 (H) 02/06/2024     Exercise Target Goals: Exercise Program Goal: Individual exercise prescription set using results from initial 6 min walk test and THRR while considering  patient's activity barriers and safety.   Exercise Prescription Goal: Initial exercise prescription builds to 30-45 minutes a day of aerobic activity, 2-3 days per week.  Home exercise guidelines will be  given to patient during program as part of exercise prescription that the participant will acknowledge.  Activity Barriers & Risk Stratification:  Activity Barriers & Cardiac Risk Stratification - 12/20/23 1605       Activity Barriers & Cardiac Risk Stratification   Activity Barriers Deconditioning;History of Falls    Cardiac Risk Stratification High          6 Minute Walk:  6 Minute Walk     Row Name 12/20/23 1601 04/07/24 1049       6 Minute Walk   Phase Initial Discharge    Distance 1745 feet 1920 feet    Distance % Change -- 10.03 %    Distance Feet Change -- 175 ft    Walk Time 6 minutes 6 minutes    # of Rest Breaks 0 0    MPH 3.3 3.64    METS 3.64 4.28    RPE 7 7    Perceived Dyspnea  0 0    VO2 Peak 12.75 14.97    Symptoms Yes (comment) No    Comments Post walk test patient states she felt swimmy headed, patient denigns SOB, light headed feeling or visual changes. Resolved within 30 seconds. Orthostatics seated 98/63 standing 93/63 asymptomatic. --    Resting HR 75 bpm 60 bpm    Resting BP 104/60 98/60    Resting Oxygen Saturation  98 % --    Exercise Oxygen Saturation  during 6 min walk 100 % --    Max Ex. HR 88 bpm 119 bpm    Max Ex. BP 116/62 128/62    2 Minute Post BP 100/60 94/62       Oxygen Initial Assessment:   Oxygen Re-Evaluation:   Oxygen Discharge (Final Oxygen Re-Evaluation):   Initial Exercise Prescription:  Initial Exercise Prescription - 12/20/23 1500       Date of Initial Exercise RX and Referring Provider   Date 12/20/23    Referring Provider Vinie Maxcy, MD    Expected Discharge Date 03/12/24      NuStep   Level 1    SPM 80    Minutes 15    METs 1.8      Recumbant Elliptical   Level 1    RPM 40    Watts 60    Minutes 15    METs 2.1      Prescription Details   Frequency (times per week) 3    Duration Progress to 30 minutes of continuous aerobic without signs/symptoms of physical distress      Intensity    THRR 40-80% of Max Heartrate 60-119    Ratings of Perceived Exertion 11-13    Perceived Dyspnea 0-4  Progression   Progression Continue progressive overload as per policy without signs/symptoms or physical distress.      Resistance Training   Training Prescription Yes    Reps 10-15          Perform Capillary Blood Glucose checks as needed.  Exercise Prescription Changes:   Exercise Prescription Changes     Row Name 12/28/23 1029 01/07/24 1033 01/25/24 1025 02/04/24 1036 02/22/24 1023     Response to Exercise   Blood Pressure (Admit) 94/62 98/70 102/62 100/60 108/60   Blood Pressure (Exercise) 110/60 108/60 -- -- --   Blood Pressure (Exit) 90/60 100/60 90/50 100/70 104/64   Heart Rate (Admit) 74 bpm 80 bpm 73 bpm 77 bpm 77 bpm   Heart Rate (Exercise) 102 bpm 106 bpm 116 bpm 108 bpm 112 bpm   Heart Rate (Exit) 76 bpm 89 bpm 71 bpm 75 bpm 75 bpm   Symptoms None None None None None   Comments Off to a great start with exercise. -- Reviewed home exercise guidelines and goals with Kristina Osborne. Increased workloads on recumbent elliptical and NuStep machines today. Increased weights. -- --   Duration Continue with 30 min of aerobic exercise without signs/symptoms of physical distress. Continue with 30 min of aerobic exercise without signs/symptoms of physical distress. Continue with 30 min of aerobic exercise without signs/symptoms of physical distress. Continue with 30 min of aerobic exercise without signs/symptoms of physical distress. Continue with 30 min of aerobic exercise without signs/symptoms of physical distress.   Intensity THRR unchanged THRR unchanged THRR unchanged THRR unchanged THRR unchanged     Progression   Progression Continue to progress workloads to maintain intensity without signs/symptoms of physical distress. Continue to progress workloads to maintain intensity without signs/symptoms of physical distress. Continue to progress workloads to maintain intensity without  signs/symptoms of physical distress. Continue to progress workloads to maintain intensity without signs/symptoms of physical distress. Continue to progress workloads to maintain intensity without signs/symptoms of physical distress.   Average METs 4.1 3.7 5.5 4.2 4.2     Resistance Training   Training Prescription Yes Yes Yes Yes Yes   Weight 2 lbs 2 lbs 3 lbs 3 lbs 3 lbs   Reps 10-15 10-15 10-15 10-15 10-15   Time 10 Minutes 10 Minutes 10 Minutes 10 Minutes 5 Minutes     Interval Training   Interval Training No No No No No     NuStep   Level 1 2 4 4 4    SPM -- 121 -- 129 149   Minutes 15 15 15 15 15    METs -- 3 -- 3.1 3.4     Recumbant Elliptical   Level 1 1 3 3 3    RPM 38 60 60 68 67   Watts 74 76 99 -- 94   Minutes 15 15 15 15 15    METs 4.1 3 5.5 5.3 5.1     Home Exercise Plan   Plans to continue exercise at -- -- Home (comment)  Walking, fitness videos Home (comment)  Walking, fitness videos Home (comment)  Walking, fitness videos   Frequency -- -- Add 4 additional days to program exercise sessions. Add 4 additional days to program exercise sessions. Add 4 additional days to program exercise sessions.   Initial Home Exercises Provided -- -- 01/25/24 01/25/24 01/25/24    Row Name 03/05/24 1029 03/10/24 1033 03/31/24 1038         Response to Exercise   Blood Pressure (Admit) 100/60 104/60 108/60  Blood Pressure (Exercise) 108/68 -- --     Blood Pressure (Exit) 100/52 96/60 90/58      Heart Rate (Admit) 70 bpm 72 bpm 80 bpm     Heart Rate (Exercise) 122 bpm 114 bpm 133 bpm     Heart Rate (Exit) 79 bpm 75 bpm 82 bpm     Rating of Perceived Exertion (Exercise) 9 9 11      Symptoms None None None     Comments Started treadmill today. Increased workload on recumbent stepper. -- --     Duration Continue with 30 min of aerobic exercise without signs/symptoms of physical distress. Continue with 30 min of aerobic exercise without signs/symptoms of physical distress. Continue  with 30 min of aerobic exercise without signs/symptoms of physical distress.     Intensity THRR unchanged THRR unchanged THRR unchanged       Progression   Progression Continue to progress workloads to maintain intensity without signs/symptoms of physical distress. Continue to progress workloads to maintain intensity without signs/symptoms of physical distress. Continue to progress workloads to maintain intensity without signs/symptoms of physical distress.     Average METs 3.8 4 3.6       Resistance Training   Training Prescription No Yes Yes     Weight Relaxation day, no weights. 3 lbs 3 lbs     Reps -- 10-15 10-15     Time -- 5 Minutes 5 Minutes       Interval Training   Interval Training No No No       Treadmill   MPH 3.3 3.3 3.5     Grade 1 1 1      Minutes 15 15 15      METs 3.98 3.98 4.16       NuStep   Level 5 5 5      SPM 113 -- 122     Minutes 15 15 15      METs 3.7 -- 3.1       Home Exercise Plan   Plans to continue exercise at Home (comment)  Walking, fitness videos Home (comment)  Walking, fitness videos Home (comment)  Walking, fitness videos     Frequency Add 4 additional days to program exercise sessions. Add 4 additional days to program exercise sessions. Add 4 additional days to program exercise sessions.     Initial Home Exercises Provided 01/25/24 01/25/24 01/25/24        Exercise Comments:   Exercise Comments     Row Name 12/28/23 1131 01/25/24 1049 02/11/24 1121 03/03/24 1046 04/02/24 1122   Exercise Comments Kristina Osborne tolerated first session of exercise well without symptoms. Oriented to the exercise equipment and stretchign routine. Reviewed home exercise guidelines and goals with Kristina Osborne. Reviewed METs with Kristina Osborne. Reviewed METs and goals with Kristina Osborne. Reviewed home exercise plan/ goals with Kristina Osborne.    Row Name 04/07/24 1056           Exercise Comments Reviewed discharge exercise action plan and goals with Kristina Osborne.          Exercise Goals and Review:    Exercise Goals     Row Name 12/20/23 1528             Exercise Goals   Increase Physical Activity Yes       Intervention Provide advice, education, support and counseling about physical activity/exercise needs.;Develop an individualized exercise prescription for aerobic and resistive training based on initial evaluation findings, risk stratification, comorbidities and participant's personal goals.  Expected Outcomes Short Term: Attend rehab on a regular basis to increase amount of physical activity.;Long Term: Exercising regularly at least 3-5 days a week.;Long Term: Add in home exercise to make exercise part of routine and to increase amount of physical activity.       Increase Strength and Stamina Yes       Intervention Provide advice, education, support and counseling about physical activity/exercise needs.;Develop an individualized exercise prescription for aerobic and resistive training based on initial evaluation findings, risk stratification, comorbidities and participant's personal goals.       Expected Outcomes Short Term: Increase workloads from initial exercise prescription for resistance, speed, and METs.;Short Term: Perform resistance training exercises routinely during rehab and add in resistance training at home;Long Term: Improve cardiorespiratory fitness, muscular endurance and strength as measured by increased METs and functional capacity ( )       Able to understand and use rate of perceived exertion (RPE) scale Yes       Intervention Provide education and explanation on how to use RPE scale       Expected Outcomes Long Term:  Able to use RPE to guide intensity level when exercising independently;Short Term: Able to use RPE daily in rehab to express subjective intensity level       Knowledge and understanding of Target Heart Rate Range (THRR) Yes       Intervention Provide education and explanation of THRR including how the numbers were predicted and where they are  located for reference       Expected Outcomes Short Term: Able to state/look up THRR;Short Term: Able to use daily as guideline for intensity in rehab;Long Term: Able to use THRR to govern intensity when exercising independently       Understanding of Exercise Prescription Yes       Intervention Provide education, explanation, and written materials on patient's individual exercise prescription       Expected Outcomes Short Term: Able to explain program exercise prescription;Long Term: Able to explain home exercise prescription to exercise independently          Exercise Goals Re-Evaluation :  Exercise Goals Re-Evaluation     Row Name 12/28/23 1131 01/25/24 1049 02/22/24 1133 03/03/24 1046 03/05/24 1127     Exercise Goal Re-Evaluation   Exercise Goals Review Increase Physical Activity;Increase Strength and Stamina;Able to understand and use rate of perceived exertion (RPE) scale Increase Physical Activity;Increase Strength and Stamina;Able to understand and use rate of perceived exertion (RPE) scale;Able to check pulse independently;Knowledge and understanding of Target Heart Rate Range (THRR);Understanding of Exercise Prescription Increase Physical Activity;Increase Strength and Stamina;Able to understand and use rate of perceived exertion (RPE) scale;Able to check pulse independently;Knowledge and understanding of Target Heart Rate Range (THRR);Understanding of Exercise Prescription Increase Physical Activity;Increase Strength and Stamina;Able to understand and use rate of perceived exertion (RPE) scale;Able to check pulse independently;Knowledge and understanding of Target Heart Rate Range (THRR);Understanding of Exercise Prescription Increase Physical Activity;Increase Strength and Stamina;Able to understand and use rate of perceived exertion (RPE) scale;Able to check pulse independently;Knowledge and understanding of Target Heart Rate Range (THRR);Understanding of Exercise Prescription   Comments  Kristina Osborne was able to understand and use RPE scale appropriately. Reviewed exercise prescription with Kristina Osborne. She is walking daily 45 minutes, achieving 5,000 steps. She previously worked out to [employer] for 45 minutes but was only able to achieve 5-10 minutes when she tried a workout recently. Her goal is to be able to do the 45 minute  workout again. She wants to be able to exercise without shortness of breath. Kristina Osborne continues to make excellent progress with exercise. Increasing workloads appropriately. Kristina Osborne states that she still has difficulty walking up hills/ inclines. We discussed adding the treadmill with incline to her exercise prescription, and she will switch from the recumbent elliptical machine to the TM and increase her level on the recumbent stepper. She hasn't resumed [employer] workouts at home but plans to this week. She would like to extend to make up missed sessions. Will extend to 04/07/24. Kristina Osborne started the treadmill today to help build endurance walking up hils/ inclines at home. She tolerated 3.3-3.5 mph with 1% incline well.   Expected Outcomes Progress workloads as tolerated to help improve cardiorespiratory fitness. Be able to exercise without SOB. Be able to complete [employer] aerobics workout. Continue to progress workloads as tolerated to increase strengthe and stamina. Kristina Osborne will switch to the TM next session with incline to help build endurance walking up hills/ inclines. Continue progression on the TM to build endurance walking up hills and inclines.    Row Name 04/02/24 1122 04/07/24 1056           Exercise Goal Re-Evaluation   Exercise Goals Review Increase Physical Activity;Increase Strength and Stamina;Able to understand and use rate of perceived exertion (RPE) scale;Able to check pulse independently;Knowledge and understanding of Target Heart Rate Range (THRR);Understanding of Exercise Prescription Increase Physical Activity;Increase Strength and Stamina;Able to  understand and use rate of perceived exertion (RPE) scale;Able to check pulse independently;Knowledge and understanding of Target Heart Rate Range (THRR);Understanding of Exercise Prescription      Comments Kristina Osborne is scheduled to complete cardiac rehab next week. Discussed her exercise plan. She will resume her workout videos and plans to start this week. Kristina Osborne really feels her participation in the program has been beneficial for her physically with her stamina and for her morale. She will continue walking and she has 2,3, and 4 lbs weights for her resistance training. Encouraged her to continue weights 2-3 non-consecutive days, increasing sets/ reps as tolerated to help improve strength.      Expected Outcomes Kristina Osborne will continue workout videos at home upon completion of the cardiac rehab program. Kristina Osborne will continue workout videos at home upon completion of the cardiac rehab program.         Discharge Exercise Prescription (Final Exercise Prescription Changes):  Exercise Prescription Changes - 03/31/24 1038       Response to Exercise   Blood Pressure (Admit) 108/60    Blood Pressure (Exit) 90/58    Heart Rate (Admit) 80 bpm    Heart Rate (Exercise) 133 bpm    Heart Rate (Exit) 82 bpm    Rating of Perceived Exertion (Exercise) 11    Symptoms None    Duration Continue with 30 min of aerobic exercise without signs/symptoms of physical distress.    Intensity THRR unchanged      Progression   Progression Continue to progress workloads to maintain intensity without signs/symptoms of physical distress.    Average METs 3.6      Resistance Training   Training Prescription Yes    Weight 3 lbs    Reps 10-15    Time 5 Minutes      Interval Training   Interval Training No      Treadmill   MPH 3.5    Grade 1    Minutes 15    METs 4.16      NuStep  Level 5    SPM 122    Minutes 15    METs 3.1      Home Exercise Plan   Plans to continue exercise at Home (comment)   Walking, fitness  videos   Frequency Add 4 additional days to program exercise sessions.    Initial Home Exercises Provided 01/25/24          Nutrition:  Target Goals: Understanding of nutrition guidelines, daily intake of sodium 1500mg , cholesterol 200mg , calories 30% from fat and 7% or less from saturated fats, daily to have 5 or more servings of fruits and vegetables.  Biometrics:   Post Biometrics - 04/07/24 1056        Post  Biometrics   Waist Circumference 31.25 inches    Hip Circumference 37.75 inches    Waist to Hip Ratio 0.83 %    Triceps Skinfold 20 mm    Grip Strength 16 kg    Flexibility 18.75 in    Single Leg Stand 30 seconds          Nutrition Therapy Plan and Nutrition Goals:  Nutrition Therapy & Goals - 03/31/24 1047       Nutrition Therapy   Diet Heart Healthy Diet      Personal Nutrition Goals   Nutrition Goal Patient to identify strategies for reducing cardiovascular risk by attending the [employer] education and nutrition series weekly.   goal not met.   Personal Goal #2 Patient to improve diet quality by using the plate method as a guide for meal planning to include lean protein/plant protein, fruits, vegetables, whole grains, nonfat dairy as part of a well-balanced diet.   goal in progress.   Comments Goals in progress. Kristina Osborne has medical history of pulmonary HTN, HFpEF, s/p mitraclip, hx gi bleed, thrombocytosis (hydroxurea). She does not attend the education component of cariac rehab. She reports that her husband recently had a stroke and he is working on weight gain. She admits to additional stress caring for him and is eating out more frequently. She is motivated to improve eating habits and opt for convenience cooking methods. 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 Patient will benefit from participation/completion in intensive cardiac  rehab and adherence to nutrition, exercise, and lifestyle modification.      Intervention Plan   Intervention Prescribe, educate and counsel regarding individualized specific dietary modifications aiming towards targeted core components such as weight, hypertension, lipid management, diabetes, heart failure and other comorbidities.;Nutrition handout(s) given to patient.    Expected Outcomes Short Term Goal: Understand basic principles of dietary content, such as calories, fat, sodium, cholesterol and nutrients.;Long Term Goal: Adherence to prescribed nutrition plan.          Nutrition Assessments:  MEDIFICTS Score Key: >=70 Need to make dietary changes  40-70 Heart Healthy Diet <= 40 Therapeutic Level Cholesterol Diet    Picture Your Plate Scores: <59 Unhealthy dietary pattern with much room for improvement. 41-50 Dietary pattern unlikely to meet recommendations for good health and room for improvement. 51-60 More healthful dietary pattern, with some room for improvement.  >60 Healthy dietary pattern, although there may be some specific behaviors that could be improved.    Nutrition Goals Re-Evaluation:  Nutrition Goals Re-Evaluation     Row Name 12/28/23 1138 02/01/24 1021 02/27/24 1018 03/31/24 1047       Goals   Current Weight 143 lb 8.3  oz (65.1 kg) 143 lb 4.8 oz (65 kg) 142 lb 6.7 oz (64.6 kg) 140 lb 14 oz (63.9 kg)    Expected Outcome Kristina Osborne has medical history of pulmonary HTN, HFpEF, s/p mitraclip, hx gi bleed, thrombocytosis (hydroxurea). She reports that her husband recently had a stroke and is working on weight gain. She admits to additional stress carrying for him and is eating out more frequently. She is motivated to improve eating habits and opt for convenience cooking methods. She continues regular follow-up with oncology related to iron  deficiency, thrombocytosis. Patient will benefit from participation in intensive cardiac rehab for nutrition, exercise, and lifestyle  modification. Goals in progress. Kristina Osborne has medical history of pulmonary HTN, HFpEF, s/p mitraclip, hx gi bleed, thrombocytosis (hydroxurea). She reports that her husband recently had a stroke and is working on weight gain. She admits to additional stress carrying for him and is eating out more frequently. She is motivated to improve eating habits and opt for convenience cooking methods. She continues regular follow-up with oncology related to iron  deficiency, thrombocytosis. She does not regularly attend the [employer] education/nutrition series. She has maintained her weight since starting with our program and BP has been well controlled. Patient will benefit from participation in intensive cardiac rehab for nutrition, exercise, and lifestyle modification. Goals in progress. Kristina Osborne has medical history of pulmonary HTN, HFpEF, s/p mitraclip, hx gi bleed, thrombocytosis (hydroxurea). She does not attend the education component of cariac rehab. She reports that her husband recently had a stroke and he is working on weight gain. She admits to additional stress carrying for him and is eating out more frequently. She is motivated to improve eating habits and opt for convenience cooking methods. 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 Patient will benefit from participation in intensive cardiac rehab for nutrition, exercise, and lifestyle modification. Goals in progress. Kristina Osborne has medical history of pulmonary HTN, HFpEF, s/p mitraclip, hx gi bleed, thrombocytosis (hydroxurea). She does not attend the education component of cariac rehab. She reports that her husband recently had a stroke and he is working on weight gain. She admits to additional stress caring for him and is eating out more frequently. She is motivated to improve eating habits and opt for convenience cooking methods.  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 Patient will benefit from participation/completion in intensive cardiac rehab and adherence to nutrition, exercise, and lifestyle modification.       Nutrition Goals Re-Evaluation:  Nutrition Goals Re-Evaluation     Row Name 12/28/23 1138 02/01/24 1021 02/27/24 1018 03/31/24 1047       Goals   Current Weight 143 lb 8.3 oz (65.1 kg) 143 lb 4.8 oz (65 kg) 142 lb 6.7 oz (64.6 kg) 140 lb 14 oz (63.9 kg)    Expected Outcome Kristina Osborne has medical history of pulmonary HTN, HFpEF, s/p mitraclip, hx gi bleed, thrombocytosis (hydroxurea). She reports that her husband recently had a stroke and is working on weight gain. She admits to additional stress carrying for him and is eating out more frequently. She is motivated to improve eating habits and opt for convenience cooking methods. She continues regular follow-up with oncology related to iron  deficiency, thrombocytosis. Patient will benefit from participation in intensive cardiac rehab for nutrition, exercise, and lifestyle modification. Goals in progress.  Kristina Osborne has medical history of pulmonary HTN, HFpEF, s/p mitraclip, hx gi bleed, thrombocytosis (hydroxurea). She reports that her husband recently had a stroke and is working on weight gain. She admits to additional stress carrying for him and is eating out more frequently. She is motivated to improve eating habits and opt for convenience cooking methods. She continues regular follow-up with oncology related to iron  deficiency, thrombocytosis. She does not regularly attend the [facility] education/nutrition series. She has maintained her weight since starting with our program and BP has been well controlled. Patient will benefit from participation in intensive cardiac rehab for nutrition, exercise, and lifestyle modification. Goals in progress. Kristina Osborne  has medical history of pulmonary HTN, HFpEF, s/p mitraclip, hx gi bleed, thrombocytosis (hydroxurea). She does not attend the education component of cariac rehab. She reports that her husband recently had a stroke and he is working on weight gain. She admits to additional stress carrying for him and is eating out more frequently. She is motivated to improve eating habits and opt for convenience cooking methods. 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 Patient will benefit from participation in intensive cardiac rehab for nutrition, exercise, and lifestyle modification. Goals in progress. Kristina Osborne has medical history of pulmonary HTN, HFpEF, s/p mitraclip, hx gi bleed, thrombocytosis (hydroxurea). She does not attend the education component of cariac rehab. She reports that her husband recently had a stroke and he is working on weight gain. She admits to additional stress caring for him and is eating out more frequently. She is motivated to improve eating habits and opt for convenience cooking methods. 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 Patient will benefit from participation/completion in intensive cardiac rehab and adherence to nutrition, exercise, and lifestyle modification.       Nutrition Goals Discharge (Final Nutrition Goals Re-Evaluation):  Nutrition Goals Re-Evaluation - 03/31/24 1047       Goals   Current Weight 140 lb 14 oz (63.9 kg)    Expected Outcome Goals in progress. Kristina Osborne has medical history of pulmonary HTN, HFpEF, s/p mitraclip, hx gi bleed, thrombocytosis (hydroxurea). She does not attend the education component of cariac rehab. She reports that her husband recently had a stroke and he is working on weight gain. She  admits to additional stress caring for him and is eating out more frequently. She is motivated to improve eating habits and opt for convenience cooking methods. 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 Patient will benefit from participation/completion in intensive cardiac rehab and adherence to nutrition, exercise, and lifestyle modification.          Psychosocial: Target Goals: Acknowledge presence or absence of significant depression and/or stress, maximize coping skills, provide positive support system. Participant is able to verbalize types and ability to use techniques and skills needed for reducing stress and depression.  Initial Review & Psychosocial Screening:  Initial Psych Review & Screening - 12/20/23 1617       Initial Review   Current issues with Current Sleep Concerns;Current Stress Concerns    Source of Stress Concerns Chronic Illness;Family;Financial;Unable to perform yard/household activities    Comments She is feeling some stress with her family needs and issues with chronic  fatigue. She's having a hard time with stamina to keep up an active lifestyle. Not interested in counseling at this time, but she knows to ask any time for additional resources      Family Dynamics   Good Support System? Yes      Barriers   Psychosocial barriers to participate in program The patient should benefit from training in stress management and relaxation.;Psychosocial barriers identified (see note)      Screening Interventions   Interventions Encouraged to exercise;To provide support and resources with identified psychosocial needs;Provide feedback about the scores to participant    Expected Outcomes Long Term Goal: Stressors or current issues are controlled or eliminated.;Short Term goal: Identification and review with participant of any Quality of Life or  Depression concerns found by scoring the questionnaire.;Long Term goal: The participant improves quality of Life and PHQ9 Scores as seen by post scores and/or verbalization of changes          Quality of Life Scores:  Quality of Life - 04/08/24 1623       Quality of Life   Select Quality of Life      Quality of Life Scores   Health/Function Pre 18.03 %    Health/Function Post 25.67 %    Health/Function % Change 42.37 %    Socioeconomic Pre 25.25 %    Socioeconomic Post 27.58 %    Socioeconomic % Change  9.23 %    Psych/Spiritual Pre 16.5 %    Psych/Spiritual Post 23 %    Psych/Spiritual % Change 39.39 %    Family Pre 21 %    Family Post 26.38 %    Family % Change 25.62 %    GLOBAL Pre 19.42 %    GLOBAL Post 25.53 %    GLOBAL % Change 31.46 %         Scores of 19 and below usually indicate a poorer quality of life in these areas.  A difference of  2-3 points is a clinically meaningful difference.  A difference of 2-3 points in the total score of the Quality of Life Index has been associated with significant improvement in overall quality of life, self-image, physical symptoms, and general health in studies assessing change in quality of life.  PHQ-9: Review Flowsheet  More data exists      04/08/2024 12/20/2023 10/18/2023 09/06/2023 08/09/2023  Depression screen PHQ 2/9  Decreased Interest 0 0 0 0 0  Down, Depressed, Hopeless 0 0 0 0 0  PHQ - 2 Score 0 0 0 0 0  Altered sleeping 1 2 - - -  Tired, decreased energy 0 3 - - -  Change in appetite 0 0 - - -  Feeling bad or failure about yourself  0 0 - - -  Trouble concentrating 0 0 - - -  Moving slowly or fidgety/restless 0 0 - - -  Suicidal thoughts 0 0 - - -  PHQ-9 Score 1 5 - - -  Difficult doing work/chores Not difficult at all Somewhat difficult - - -   Interpretation of Total Score  Total Score Depression Severity:  1-4 = Minimal depression, 5-9 = Mild depression, 10-14 = Moderate depression, 15-19 = Moderately  severe depression, 20-27 = Severe depression   Psychosocial Evaluation and Intervention:   Psychosocial Re-Evaluation:  Psychosocial Re-Evaluation     Row Name 12/31/23 9160 01/03/24 1040 01/15/24 1310 02/11/24 1844 03/04/24 1702     Psychosocial Re-Evaluation   Current issues with Current Stress Concerns;Current  Sleep Concerns Current Stress Concerns;Current Sleep Concerns Current Stress Concerns;Current Sleep Concerns Current Stress Concerns;Current Sleep Concerns Current Stress Concerns;Current Sleep Concerns   Comments Kristina Osborne did not voice any increased concerns or stressors on her first day of exercise. Kristina Osborne review quality of life and PHQ2-9 and quality of life in the upcoming week Reviewed quality of life and PHQ2-9.Kristina Osborne denies being depressed currently. 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 Kristina Osborne says she is now feeling better and has a little more energy since starting exercise at cardiac rehab.Kristina Osborne said she had home health and found it to be intrusive. Kristina Osborne's husband has an appointment to be evaluated for neuro rehab. Kristina Osborne says she sometimes has difficulty staying asleep. Discussed sleep hygiene methods. Kristina Osborne is pleased with her husbands appointment with neuor rehab as he will be getting therapy twice a week. Kristina Osborne continues to juggle her own recovery post mitrl valve surgery and taking care of her husband Kristina Osborne continues to manage taking care of her husband. Kristina Osborne reports she has been feeling better since she has been participating in cardiac rehab   Expected Outcomes Kristina Osborne will have controlled or decreased stressors upon compleiton of cardiac rehab Kristina Osborne will have controlled or decreased stressors upon compleiton of cardiac rehab Kristina Osborne will have controlled or decreased stressors upon compleiton of cardiac rehab Kristina Osborne will have controlled or decreased stressors upon compleiton of cardiac rehab Kristina Osborne will have controlled or decreased stressors upon  compleiton of cardiac rehab   Interventions Stress management education;Encouraged to attend Cardiac Rehabilitation for the exercise;Relaxation education Stress management education;Encouraged to attend Cardiac Rehabilitation for the exercise;Relaxation education Stress management education;Encouraged to attend Cardiac Rehabilitation for the exercise;Relaxation education Stress management education;Encouraged to attend Cardiac Rehabilitation for the exercise;Relaxation education Stress management education;Encouraged to attend Cardiac Rehabilitation for the exercise;Relaxation education   Continue Psychosocial Services  Follow up required by staff Follow up required by staff Follow up required by staff Follow up required by staff Follow up required by staff     Initial Review   Source of Stress Concerns Family;Chronic Illness Family;Chronic Illness Family;Chronic Illness Family;Chronic Illness Family;Chronic Illness   Comments Will continue to monitor and offer support as needed Will continue to monitor and offer support as needed Will continue to monitor and offer support as needed Will continue to monitor and offer support as needed Will continue to monitor and offer support as needed    Row Name 04/08/24 1611             Psychosocial Re-Evaluation   Current issues with Current Stress Concerns;Current Sleep Concerns       Comments Kristina Osborne continues to manage taking care of her husband. Kristina Osborne reports she has been feeling better since she has been participating in cardiac rehab. Kristina Osborne will complete cardiac rehab on 04/11/24       Expected Outcomes Kristina Osborne will have controlled or decreased stressors upon compleiton of cardiac rehab       Interventions Stress management education;Encouraged to attend Cardiac Rehabilitation for the exercise;Relaxation education       Continue Psychosocial Services  Follow up required by staff         Initial Review   Source of Stress Concerns Family;Chronic Illness        Comments Will continue to monitor and offer support as needed          Psychosocial Discharge (Final Psychosocial Re-Evaluation):  Psychosocial Re-Evaluation - 04/08/24 1611       Psychosocial Re-Evaluation  Current issues with Current Stress Concerns;Current Sleep Concerns    Comments Kristina Osborne continues to manage taking care of her husband. Kristina Osborne reports she has been feeling better since she has been participating in cardiac rehab. Kristina Osborne will complete cardiac rehab on 04/11/24    Expected Outcomes Kristina Osborne will have controlled or decreased stressors upon compleiton of cardiac rehab    Interventions Stress management education;Encouraged to attend Cardiac Rehabilitation for the exercise;Relaxation education    Continue Psychosocial Services  Follow up required by staff      Initial Review   Source of Stress Concerns Family;Chronic Illness    Comments Will continue to monitor and offer support as needed          Vocational Rehabilitation: Provide vocational rehab assistance to qualifying candidates.   Vocational Rehab Evaluation & Intervention:  Vocational Rehab - 12/20/23 1529       Initial Vocational Rehab Evaluation & Intervention   Assessment shows need for Vocational Rehabilitation No   retired         Education: Education Goals: Education classes will be provided on a weekly basis, covering required topics. Participant will state understanding/return demonstration of topics presented.     Core Videos: Exercise    Move It!  Clinical staff conducted group or individual video education with verbal and written material and guidebook.  Patient learns the recommended [facility]ikin exercise program. Exercise with the goal of living a long, healthy life. Some of the health benefits of exercise include controlled diabetes, healthier blood pressure levels, improved cholesterol levels, improved heart and lung capacity, improved sleep, and better body composition. Everyone should  speak with their doctor before starting or changing an exercise routine.  Biomechanical Limitations Clinical staff conducted group or individual video education with verbal and written material and guidebook.  Patient learns how biomechanical limitations can impact exercise and how we can mitigate and possibly overcome limitations to have an impactful and balanced exercise routine.  Body Composition Clinical staff conducted group or individual video education with verbal and written material and guidebook.  Patient learns that body composition (ratio of muscle mass to fat mass) is a key component to assessing overall fitness, rather than body weight alone. Increased fat mass, especially visceral belly fat, can put us  at increased risk for metabolic syndrome, type 2 diabetes, heart disease, and even death. It is recommended to combine diet and exercise (cardiovascular and resistance training) to improve your body composition. Seek guidance from your physician and exercise physiologist before implementing an exercise routine.  Exercise Action Plan Clinical staff conducted group or individual video education with verbal and written material and guidebook.  Patient learns the recommended strategies to achieve and enjoy long-term exercise adherence, including variety, self-motivation, self-efficacy, and positive decision making. Benefits of exercise include fitness, good health, weight management, more energy, better sleep, less stress, and overall well-being.  Medical   Heart Disease Risk Reduction Clinical staff conducted group or individual video education with verbal and written material and guidebook.  Patient learns our heart is our most vital organ as it circulates oxygen, nutrients, white blood cells, and hormones throughout the entire body, and carries waste away. Data supports a plant-based eating plan like the Pritikin Program for its effectiveness in slowing progression of and reversing heart  disease. The video provides a number of recommendations to address heart disease.   Metabolic Syndrome and Belly Fat  Clinical staff conducted group or individual video education with verbal and written material and guidebook.  Patient learns what  metabolic syndrome is, how it leads to heart disease, and how one can reverse it and keep it from coming back. You have metabolic syndrome if you have 3 of the following 5 criteria: abdominal obesity, high blood pressure, high triglycerides, low HDL cholesterol, and high blood sugar.  Hypertension and Heart Disease Clinical staff conducted group or individual video education with verbal and written material and guidebook.  Patient learns that high blood pressure, or hypertension, is very common in the [place] . Hypertension is largely due to excessive salt intake, but other important risk factors include being overweight, physical inactivity, drinking too much alcohol, smoking, and not eating enough potassium from fruits and vegetables. High blood pressure is a leading risk factor for heart attack, stroke, congestive heart failure, dementia, kidney failure, and premature death. Long-term effects of excessive salt intake include stiffening of the arteries and thickening of heart muscle and organ damage. Recommendations include ways to reduce hypertension and the risk of heart disease.  Diseases of Our Time - Focusing on Diabetes Clinical staff conducted group or individual video education with verbal and written material and guidebook.  Patient learns why the best way to stop diseases of our time is prevention, through food and other lifestyle changes. Medicine (such as prescription pills and surgeries) is often only a Band-Aid on the problem, not a long-term solution. Most common diseases of our time include obesity, type 2 diabetes, hypertension, heart disease, and cancer. The Pritikin Program is recommended and has been proven to help reduce, reverse,  and/or prevent the damaging effects of metabolic syndrome.  Nutrition   Overview of the Pritikin Eating Plan  Clinical staff conducted group or individual video education with verbal and written material and guidebook.  Patient learns about the Pritikin Eating Plan for disease risk reduction. The Pritikin Eating Plan emphasizes a wide variety of unrefined, minimally-processed carbohydrates, like fruits, vegetables, whole grains, and legumes. Go, Caution, and Stop food choices are explained. Plant-based and lean animal proteins are emphasized. Rationale provided for low sodium intake for blood pressure control, low added sugars for blood sugar stabilization, and low added fats and oils for coronary artery disease risk reduction and weight management.  Calorie Density  Clinical staff conducted group or individual video education with verbal and written material and guidebook.  Patient learns about calorie density and how it impacts the Pritikin Eating Plan. Knowing the characteristics of the food you choose will help you decide whether those foods will lead to weight gain or weight loss, and whether you want to consume more or less of them. Weight loss is usually a side effect of the Pritikin Eating Plan because of its focus on low calorie-dense foods.  Label Reading  Clinical staff conducted group or individual video education with verbal and written material and guidebook.  Patient learns about the Pritikin recommended label reading guidelines and corresponding recommendations regarding calorie density, added sugars, sodium content, and whole grains.  Dining Out - Part 1  Clinical staff conducted group or individual video education with verbal and written material and guidebook.  Patient learns that restaurant meals can be sabotaging because they can be so high in calories, fat, sodium, and/or sugar. Patient learns recommended strategies on how to positively address this and avoid unhealthy  pitfalls.  Facts on Fats  Clinical staff conducted group or individual video education with verbal and written material and guidebook.  Patient learns that lifestyle modifications can be just as effective, if not more so, as many medications for  lowering your risk of heart disease. A Pritikin lifestyle can help to reduce your risk of inflammation and atherosclerosis (cholesterol build-up, or plaque, in the artery walls). Lifestyle interventions such as dietary choices and physical activity address the cause of atherosclerosis. A review of the types of fats and their impact on blood cholesterol levels, along with dietary recommendations to reduce fat intake is also included.  Nutrition Action Plan  Clinical staff conducted group or individual video education with verbal and written material and guidebook.  Patient learns how to incorporate Pritikin recommendations into their lifestyle. Recommendations include planning and keeping personal health goals in mind as an important part of their success.  Healthy Mind-Set    Healthy Minds, Bodies, Hearts  Clinical staff conducted group or individual video education with verbal and written material and guidebook.  Patient learns how to identify when they are stressed. Video will discuss the impact of that stress, as well as the many benefits of stress management. Patient will also be introduced to stress management techniques. The way we think, act, and feel has an impact on our hearts.  How Our Thoughts Can Heal Our Hearts  Clinical staff conducted group or individual video education with verbal and written material and guidebook.  Patient learns that negative thoughts can cause depression and anxiety. This can result in negative lifestyle behavior and serious health problems. Cognitive behavioral therapy is an effective method to help control our thoughts in order to change and improve our emotional outlook.  Additional Videos:  Exercise    Improving  Performance  Clinical staff conducted group or individual video education with verbal and written material and guidebook.  Patient learns to use a non-linear approach by alternating intensity levels and lengths of time spent exercising to help burn more calories and lose more body fat. Cardiovascular exercise helps improve heart health, metabolism, hormonal balance, blood sugar control, and recovery from fatigue. Resistance training improves strength, endurance, balance, coordination, reaction time, metabolism, and muscle mass. Flexibility exercise improves circulation, posture, and balance. Seek guidance from your physician and exercise physiologist before implementing an exercise routine and learn your capabilities and proper form for all exercise.  Introduction to Yoga  Clinical staff conducted group or individual video education with verbal and written material and guidebook.  Patient learns about yoga, a discipline of the coming together of mind, breath, and body. The benefits of yoga include improved flexibility, improved range of motion, better posture and core strength, increased lung function, weight loss, and positive self-image. Yoga's heart health benefits include lowered blood pressure, healthier heart rate, decreased cholesterol and triglyceride levels, improved immune function, and reduced stress. Seek guidance from your physician and exercise physiologist before implementing an exercise routine and learn your capabilities and proper form for all exercise.  Medical   Aging: Enhancing Your Quality of Life  Clinical staff conducted group or individual video education with verbal and written material and guidebook.  Patient learns key strategies and recommendations to stay in good physical health and enhance quality of life, such as prevention strategies, having an advocate, securing a Health Care Proxy and Power of Attorney, and keeping a list of medications and system for tracking them. It  also discusses how to avoid risk for bone loss.  Biology of Weight Control  Clinical staff conducted group or individual video education with verbal and written material and guidebook.  Patient learns that weight gain occurs because we consume more calories than we burn (eating more, moving less). Even if your  body weight is normal, you may have higher ratios of fat compared to muscle mass. Too much body fat puts you at increased risk for cardiovascular disease, heart attack, stroke, type 2 diabetes, and obesity-related cancers. In addition to exercise, following the Pritikin Eating Plan can help reduce your risk.  Decoding Lab Results  Clinical staff conducted group or individual video education with verbal and written material and guidebook.  Patient learns that lab test reflects one measurement whose values change over time and are influenced by many factors, including medication, stress, sleep, exercise, food, hydration, pre-existing medical conditions, and more. It is recommended to use the knowledge from this video to become more involved with your lab results and evaluate your numbers to speak with your doctor.   Diseases of Our Time - Overview  Clinical staff conducted group or individual video education with verbal and written material and guidebook.  Patient learns that according to the CDC, 50% to 70% of chronic diseases (such as obesity, type 2 diabetes, elevated lipids, hypertension, and heart disease) are avoidable through lifestyle improvements including healthier food choices, listening to satiety cues, and increased physical activity.  Sleep Disorders Clinical staff conducted group or individual video education with verbal and written material and guidebook.  Patient learns how good quality and duration of sleep are important to overall health and well-being. Patient also learns about sleep disorders and how they impact health along with recommendations to address them, including  discussing with a physician.  Nutrition  Dining Out - Part 2 Clinical staff conducted group or individual video education with verbal and written material and guidebook.  Patient learns how to plan ahead and communicate in order to maximize their dining experience in a healthy and nutritious manner. Included are recommended food choices based on the type of restaurant the patient is visiting.   Fueling a Banker conducted group or individual video education with verbal and written material and guidebook.  There is a strong connection between our food choices and our health. Diseases like obesity and type 2 diabetes are very prevalent and are in large-part due to lifestyle choices. The Pritikin Eating Plan provides plenty of food and hunger-curbing satisfaction. It is easy to follow, affordable, and helps reduce health risks.  Menu Workshop  Clinical staff conducted group or individual video education with verbal and written material and guidebook.  Patient learns that restaurant meals can sabotage health goals because they are often packed with calories, fat, sodium, and sugar. Recommendations include strategies to plan ahead and to communicate with the manager, chef, or server to help order a healthier meal.  Planning Your Eating Strategy  Clinical staff conducted group or individual video education with verbal and written material and guidebook.  Patient learns about the Pritikin Eating Plan and its benefit of reducing the risk of disease. The Pritikin Eating Plan does not focus on calories. Instead, it emphasizes high-quality, nutrient-rich foods. By knowing the characteristics of the foods, we choose, we can determine their calorie density and make informed decisions.  Targeting Your Nutrition Priorities  Clinical staff conducted group or individual video education with verbal and written material and guidebook.  Patient learns that lifestyle habits have a tremendous  impact on disease risk and progression. This video provides eating and physical activity recommendations based on your personal health goals, such as reducing LDL cholesterol, losing weight, preventing or controlling type 2 diabetes, and reducing high blood pressure.  Vitamins and Minerals  Clinical staff conducted group  or individual video education with verbal and written material and guidebook.  Patient learns different ways to obtain key vitamins and minerals, including through a recommended healthy diet. It is important to discuss all supplements you take with your doctor.   Healthy Mind-Set    Smoking Cessation  Clinical staff conducted group or individual video education with verbal and written material and guidebook.  Patient learns that cigarette smoking and tobacco addiction pose a serious health risk which affects millions of people. Stopping smoking will significantly reduce the risk of heart disease, lung disease, and many forms of cancer. Recommended strategies for quitting are covered, including working with your doctor to develop a successful plan.  Culinary   Becoming a Set designer conducted group or individual video education with verbal and written material and guidebook.  Patient learns that cooking at home can be healthy, cost-effective, quick, and puts them in control. Keys to cooking healthy recipes will include looking at your recipe, assessing your equipment needs, planning ahead, making it simple, choosing cost-effective seasonal ingredients, and limiting the use of added fats, salts, and sugars.  Cooking - Breakfast and Snacks  Clinical staff conducted group or individual video education with verbal and written material and guidebook.  Patient learns how important breakfast is to satiety and nutrition through the entire day. Recommendations include key foods to eat during breakfast to help stabilize blood sugar levels and to prevent overeating at meals  later in the day. Planning ahead is also a key component.  Cooking - Educational psychologist conducted group or individual video education with verbal and written material and guidebook.  Patient learns eating strategies to improve overall health, including an approach to cook more at home. Recommendations include thinking of animal protein as a side on your plate rather than center stage and focusing instead on lower calorie dense options like vegetables, fruits, whole grains, and plant-based proteins, such as beans. Making sauces in large quantities to freeze for later and leaving the skin on your vegetables are also recommended to maximize your experience.  Cooking - Healthy Salads and Dressing Clinical staff conducted group or individual video education with verbal and written material and guidebook.  Patient learns that vegetables, fruits, whole grains, and legumes are the foundations of the Pritikin Eating Plan. Recommendations include how to incorporate each of these in flavorful and healthy salads, and how to create homemade salad dressings. Proper handling of ingredients is also covered. Cooking - Soups and [employer] - Soups and Desserts Clinical staff conducted group or individual video education with verbal and written material and guidebook.  Patient learns that Pritikin soups and desserts make for easy, nutritious, and delicious snacks and meal components that are low in sodium, fat, sugar, and calorie density, while high in vitamins, minerals, and filling fiber. Recommendations include simple and healthy ideas for soups and desserts.   Overview     The Pritikin Solution Program Overview Clinical staff conducted group or individual video education with verbal and written material and guidebook.  Patient learns that the results of the Pritikin Program have been documented in more than 100 articles published in peer-reviewed journals, and the benefits include reducing  risk factors for (and, in some cases, even reversing) high cholesterol, high blood pressure, type 2 diabetes, obesity, and more! An overview of the three key pillars of the Pritikin Program will be covered: eating well, doing regular exercise, and having a healthy mind-set.  WORKSHOPS  Exercise:  Exercise Basics: Building Your Action Plan Clinical staff led group instruction and group discussion with PowerPoint presentation and patient guidebook. To enhance the learning environment the use of posters, models and videos may be added. At the conclusion of this workshop, patients will comprehend the difference between physical activity and exercise, as well as the benefits of incorporating both, into their routine. Patients will understand the FITT (Frequency, Intensity, Time, and Type) principle and how to use it to build an exercise action plan. In addition, safety concerns and other considerations for exercise and cardiac rehab will be addressed by the presenter. The purpose of this lesson is to promote a comprehensive and effective weekly exercise routine in order to improve patients' overall level of fitness.   Managing Heart Disease: Your Path to a Healthier Heart Clinical staff led group instruction and group discussion with PowerPoint presentation and patient guidebook. To enhance the learning environment the use of posters, models and videos may be added.At the conclusion of this workshop, patients will understand the anatomy and physiology of the heart. Additionally, they will understand how Pritikin's three pillars impact the risk factors, the progression, and the management of heart disease.  The purpose of this lesson is to provide a high-level overview of the heart, heart disease, and how the Pritikin lifestyle positively impacts risk factors.  Exercise Biomechanics Clinical staff led group instruction and group discussion with PowerPoint presentation and patient guidebook. To enhance  the learning environment the use of posters, models and videos may be added. Patients will learn how the structural parts of their bodies function and how these functions impact their daily activities, movement, and exercise. Patients will learn how to promote a neutral spine, learn how to manage pain, and identify ways to improve their physical movement in order to promote healthy living. The purpose of this lesson is to expose patients to common physical limitations that impact physical activity. Participants will learn practical ways to adapt and manage aches and pains, and to minimize their effect on regular exercise. Patients will learn how to maintain good posture while sitting, walking, and lifting.  Balance Training and Fall Prevention  Clinical staff led group instruction and group discussion with PowerPoint presentation and patient guidebook. To enhance the learning environment the use of posters, models and videos may be added. At the conclusion of this workshop, patients will understand the importance of their sensorimotor skills (vision, proprioception, and the vestibular system) in maintaining their ability to balance as they age. Patients will apply a variety of balancing exercises that are appropriate for their current level of function. Patients will understand the common causes for poor balance, possible solutions to these problems, and ways to modify their physical environment in order to minimize their fall risk. The purpose of this lesson is to teach patients about the importance of maintaining balance as they age and ways to minimize their risk of falling.  WORKSHOPS   Nutrition:  Fueling a Ship broker led group instruction and group discussion with PowerPoint presentation and patient guidebook. To enhance the learning environment the use of posters, models and videos may be added. Patients will review the foundational principles of the Pritikin Eating Plan  and understand what constitutes a serving size in each of the food groups. Patients will also learn Pritikin-friendly foods that are better choices when away from home and review make-ahead meal and snack options. Calorie density will be reviewed and applied to three nutrition priorities: weight maintenance, weight loss, and weight gain.  The purpose of this lesson is to reinforce (in a group setting) the key concepts around what patients are recommended to eat and how to apply these guidelines when away from home by planning and selecting Pritikin-friendly options. Patients will understand how calorie density may be adjusted for different weight management goals.  Mindful Eating  Clinical staff led group instruction and group discussion with PowerPoint presentation and patient guidebook. To enhance the learning environment the use of posters, models and videos may be added. Patients will briefly review the concepts of the Pritikin Eating Plan and the importance of low-calorie dense foods. The concept of mindful eating will be introduced as well as the importance of paying attention to internal hunger signals. Triggers for non-hunger eating and techniques for dealing with triggers will be explored. The purpose of this lesson is to provide patients with the opportunity to review the basic principles of the Pritikin Eating Plan, discuss the value of eating mindfully and how to measure internal cues of hunger and fullness using the Hunger Scale. Patients will also discuss reasons for non-hunger eating and learn strategies to use for controlling emotional eating.  Targeting Your Nutrition Priorities Clinical staff led group instruction and group discussion with PowerPoint presentation and patient guidebook. To enhance the learning environment the use of posters, models and videos may be added. Patients will learn how to determine their genetic susceptibility to disease by reviewing their family history. Patients  will gain insight into the importance of diet as part of an overall healthy lifestyle in mitigating the impact of genetics and other environmental insults. The purpose of this lesson is to provide patients with the opportunity to assess their personal nutrition priorities by looking at their family history, their own health history and current risk factors. Patients will also be able to discuss ways of prioritizing and modifying the Pritikin Eating Plan for their highest risk areas  Menu  Clinical staff led group instruction and group discussion with PowerPoint presentation and patient guidebook. To enhance the learning environment the use of posters, models and videos may be added. Using menus brought in from [employer], or printed from [employer], patients will apply the Pritikin dining out guidelines that were presented in the [employer] video. Patients will also be able to practice these guidelines in a variety of provided scenarios. The purpose of this lesson is to provide patients with the opportunity to practice hands-on learning of the Pritikin Dining Out guidelines with actual menus and practice scenarios.  Label Reading Clinical staff led group instruction and group discussion with PowerPoint presentation and patient guidebook. To enhance the learning environment the use of posters, models and videos may be added. Patients will review and discuss the Pritikin label reading guidelines presented in Pritikin's Label Reading Educational series video. Using fool labels brought in from local grocery stores and markets, patients will apply the label reading guidelines and determine if the packaged food meet the Pritikin guidelines. The purpose of this lesson is to provide patients with the opportunity to review, discuss, and practice hands-on learning of the Pritikin Label Reading guidelines with actual packaged food labels. Cooking School  Pritikin's [employer]  are designed to teach patients ways to prepare quick, simple, and affordable recipes at home. The importance of nutrition's role in chronic disease risk reduction is reflected in its emphasis in the overall Pritikin program. By learning how to prepare essential core Pritikin Eating Plan recipes, patients will increase control over what they eat;  be able to customize the flavor of foods without the use of added salt, sugar, or fat; and improve the quality of the food they consume. By learning a set of core recipes which are easily assembled, quickly prepared, and affordable, patients are more likely to prepare more healthy foods at home. These workshops focus on convenient breakfasts, simple entres, side dishes, and desserts which can be prepared with minimal effort and are consistent with nutrition recommendations for cardiovascular risk reduction. Cooking [employer] are taught by a Armed forces logistics/support/administrative officer (RD) who has been trained by the [employer]. The chef or RD has a clear understanding of the importance of minimizing - if not completely eliminating - added fat, sugar, and sodium in recipes. Throughout the series of Cooking School Workshop sessions, patients will learn about healthy ingredients and efficient methods of cooking to build confidence in their capability to prepare    Cooking School weekly topics:  Adding Flavor- Sodium-Free  Fast and Healthy Breakfasts  Powerhouse Plant-Based Proteins  Satisfying Salads and Dressings  Simple Sides and Sauces  International Cuisine-Spotlight on the [employer] Zones  Delicious Desserts  Savory Soups  [employer] - Meals in a Astronomer Appetizers and Snacks  Comforting Weekend Breakfasts  One-Pot Wonders   Fast Evening Meals  Landscape architect Your Pritikin Plate  WORKSHOPS   Healthy Mindset (Psychosocial):  Focused Goals, Sustainable Changes Clinical staff led group instruction and group discussion  with PowerPoint presentation and patient guidebook. To enhance the learning environment the use of posters, models and videos may be added. Patients will be able to apply effective goal setting strategies to establish at least one personal goal, and then take consistent, meaningful action toward that goal. They will learn to identify common barriers to achieving personal goals and develop strategies to overcome them. Patients will also gain an understanding of how our mind-set can impact our ability to achieve goals and the importance of cultivating a positive and growth-oriented mind-set. The purpose of this lesson is to provide patients with a deeper understanding of how to set and achieve personal goals, as well as the tools and strategies needed to overcome common obstacles which may arise along the way.  From Head to Heart: The Power of a Healthy Outlook  Clinical staff led group instruction and group discussion with PowerPoint presentation and patient guidebook. To enhance the learning environment the use of posters, models and videos may be added. Patients will be able to recognize and describe the impact of emotions and mood on physical health. They will discover the importance of self-care and explore self-care practices which may work for them. Patients will also learn how to utilize the 4 C's to cultivate a healthier outlook and better manage stress and challenges. The purpose of this lesson is to demonstrate to patients how a healthy outlook is an essential part of maintaining good health, especially as they continue their cardiac rehab journey.  Healthy Sleep for a Healthy Heart Clinical staff led group instruction and group discussion with PowerPoint presentation and patient guidebook. To enhance the learning environment the use of posters, models and videos may be added. At the conclusion of this workshop, patients will be able to demonstrate knowledge of the importance of sleep to overall  health, well-being, and quality of life. They will understand the symptoms of, and treatments for, common sleep disorders. Patients will also be able to identify daytime and nighttime behaviors which impact sleep, and they will  be able to apply these tools to help manage sleep-related challenges. The purpose of this lesson is to provide patients with a general overview of sleep and outline the importance of quality sleep. Patients will learn about a few of the most common sleep disorders. Patients will also be introduced to the concept of "sleep hygiene," and discover ways to self-manage certain sleeping problems through simple daily behavior changes. Finally, the workshop will motivate patients by clarifying the links between quality sleep and their goals of heart-healthy living.   Recognizing and Reducing Stress Clinical staff led group instruction and group discussion with PowerPoint presentation and patient guidebook. To enhance the learning environment the use of posters, models and videos may be added. At the conclusion of this workshop, patients will be able to understand the types of stress reactions, differentiate between acute and chronic stress, and recognize the impact that chronic stress has on their health. They will also be able to apply different coping mechanisms, such as reframing negative self-talk. Patients will have the opportunity to practice a variety of stress management techniques, such as deep abdominal breathing, progressive muscle relaxation, and/or guided imagery.  The purpose of this lesson is to educate patients on the role of stress in their lives and to provide healthy techniques for coping with it.  Learning Barriers/Preferences:  Learning Barriers/Preferences - 12/20/23 1622       Learning Barriers/Preferences   Learning Barriers Exercise Concerns   Patient has had 2 falls in the past year. But she did do well on her 30 second single leg stand. She achieved 30 seconds    Learning Preferences Written Material;Group Instruction;Computer/Internet;Individual Instruction          Education Topics:  Knowledge Questionnaire Score:  Knowledge Questionnaire Score - 04/08/24 1621       Knowledge Questionnaire Score   Pre Score 23/24    Post Score 24/24          Core Components/Risk Factors/Patient Goals at Admission:  Personal Goals and Risk Factors at Admission - 12/20/23 1531       Core Components/Risk Factors/Patient Goals on Admission    Weight Management Yes    Intervention Weight Management: Develop a combined nutrition and exercise program designed to reach desired caloric intake, while maintaining appropriate intake of nutrient and fiber, sodium and fats, and appropriate energy expenditure required for the weight goal.;Weight Management: Provide education and appropriate resources to help participant work on and attain dietary goals.    Expected Outcomes Short Term: Continue to assess and modify interventions until short term weight is achieved;Long Term: Adherence to nutrition and physical activity/exercise program aimed toward attainment of established weight goal;Understanding recommendations for meals to include 15-35% energy as protein, 25-35% energy from fat, 35-60% energy from carbohydrates, less than 200mg  of dietary cholesterol, 20-35 gm of total fiber daily;Understanding of distribution of calorie intake throughout the day with the consumption of 4-5 meals/snacks    Heart Failure Yes    Intervention Provide a combined exercise and nutrition program that is supplemented with education, support and counseling about heart failure. Directed toward relieving symptoms such as shortness of breath, decreased exercise tolerance, and extremity edema.    Expected Outcomes Improve functional capacity of life;Short term: Attendance in program 2-3 days a week with increased exercise capacity. Reported lower sodium intake. Reported increased fruit and  vegetable intake. Reports medication compliance.;Short term: Daily weights obtained and reported for increase. Utilizing diuretic protocols set by physician.;Long term: Adoption of self-care skills and reduction of  barriers for early signs and symptoms recognition and intervention leading to self-care maintenance.    Hypertension Yes    Intervention Provide education on lifestyle modifcations including regular physical activity/exercise, weight management, moderate sodium restriction and increased consumption of fresh fruit, vegetables, and low fat dairy, alcohol moderation, and smoking cessation.;Monitor prescription use compliance.    Expected Outcomes Long Term: Maintenance of blood pressure at goal levels.;Short Term: Continued assessment and intervention until BP is < 140/69mm HG in hypertensive participants. < 130/5mm HG in hypertensive participants with diabetes, heart failure or chronic kidney disease.    Lipids Yes    Intervention Provide education and support for participant on nutrition & aerobic/resistive exercise along with prescribed medications to achieve LDL 70mg , HDL >40mg .    Expected Outcomes Short Term: Participant states understanding of desired cholesterol values and is compliant with medications prescribed. Participant is following exercise prescription and nutrition guidelines.;Long Term: Cholesterol controlled with medications as prescribed, with individualized exercise RX and with personalized nutrition plan. Value goals: LDL < 70mg , HDL > 40 mg.    Stress Yes    Intervention Offer individual and/or small group education and counseling on adjustment to heart disease, stress management and health-related lifestyle change. Teach and support self-help strategies.;Refer participants experiencing significant psychosocial distress to appropriate mental health specialists for further evaluation and treatment. When possible, include family members and significant others in  education/counseling sessions.    Expected Outcomes Long Term: Emotional wellbeing is indicated by absence of clinically significant psychosocial distress or social isolation.;Short Term: Participant demonstrates changes in health-related behavior, relaxation and other stress management skills, ability to obtain effective social support, and compliance with psychotropic medications if prescribed.    Personal Goal Other Yes    Personal Goal Patient wants to know limits to exercise, increase strength, stamina. To feel safe getting a good workout    Intervention Will continue to monitor pt and progress workloads as tolerated without sign or symptom    Expected Outcomes Pt will achieve her goals and gain strength          Core Components/Risk Factors/Patient Goals Review:   Goals and Risk Factor Review     Row Name 01/15/24 1316 02/11/24 1846 03/04/24 1706 04/08/24 1613       Core Components/Risk Factors/Patient Goals Review   Personal Goals Review Weight Management/Obesity;Stress Weight Management/Obesity;Stress Weight Management/Obesity;Stress Weight Management/Obesity;Stress    Review Kristina Osborne is off to a good start to exercise at cardiac rehab. resting systolic BP's have been in the 90's. Kristina Osborne has been asymptonmatic and encouraged to drink more water as Kristina Osborne is not taking any blood pressure medications at this time resting systolic BP's remains in the 90's. Kristina Osborne returned to exercis on 02/11/24 after complaining of feeling lightheaded. no reports of symptoms will continue to monitor, resting systolic BP's remains in the 90's. Kristina Osborne remains asymptomatic with exercise. Vital signs have been stable. Kristina Osborne has increased her workloads. resting systolic BP's remains in the 90's. Kristina Osborne remains asymptomatic with exercise. Vital signs remain stable. Kristina Osborne will complete cardiac rehab on 04/11/24    Expected Outcomes Kristina Osborne will continue to participate in cardiac rehab for exercise, nutrition and lifestyle  modifications Kristina Osborne will continue to participate in cardiac rehab for exercise, nutrition and lifestyle modifications Kristina Osborne will continue to participate in cardiac rehab for exercise, nutrition and lifestyle modifications Kristina Osborne will continue to participate in cardiac rehab for exercise, nutrition and lifestyle modifications       Core Components/Risk Factors/Patient Goals at Discharge (Final Review):  Goals and Risk Factor Review - 04/08/24 1613       Core Components/Risk Factors/Patient Goals Review   Personal Goals Review Weight Management/Obesity;Stress    Review resting systolic BP's remains in the 90's. Kristina Osborne remains asymptomatic with exercise. Vital signs remain stable. 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 Introduction to [facility] education/intensive cardiac rehab. Initial orientation packet reviewed with patient. Kristina Osborne completed first session of exercise in the cardiac rehab program well without symptoms. 30 Day ITP Review. Kristina Osborne is off to a good start ot exercise at cardiac rehab. 30 Day ITP Review. Kristina Osborne has good participation with exercise at cardiac rehab. Patient retrned to exercise after being out due to complaints of feeling lightheaded 30 Day ITP Review. Kristina Osborne has good participation and attendance with exercise at cardiac rehab.    Row Name 04/08/24 1610           ITP Comments 30 Day ITP Review. Kristina Osborne continues to have good participation and attendance with exercise at cardiac rehab. Kristina Osborne will complete cardiac rehab on 04/11/24          Comments: See ITP Comments

## 2024-04-08 NOTE — Telephone Encounter (Signed)
 The patient called and said she has scheduled an appointment with the nurse. I confirmed no scheduled appointments and scheduled one with her for this week. The patient is aware of the changes made and confirmed the details.

## 2024-04-09 ENCOUNTER — Encounter (HOSPITAL_COMMUNITY)
Admission: RE | Admit: 2024-04-09 | Discharge: 2024-04-09 | Disposition: A | Discharge status: Home / Self Care | Source: Ambulatory Visit | Attending: Internal Medicine | Admitting: Internal Medicine

## 2024-04-09 VITALS — BP 100/60 | HR 79 | Ht 66.0 in | Wt 140.0 lb

## 2024-04-09 DIAGNOSIS — Z9889 Other specified postprocedural states: Secondary | ICD-10-CM

## 2024-04-09 DIAGNOSIS — Z48812 Encounter for surgical aftercare following surgery on the circulatory system: Secondary | ICD-10-CM | POA: Diagnosis not present

## 2024-04-09 NOTE — Progress Notes (Signed)
 Discharge Progress Report  Patient Details  Name: Kristina Osborne MRN: [id number] Date of Birth: 20-Jun-1951 Referring Provider:   Flowsheet Row INTENSIVE CARDIAC REHAB ORIENT from 12/20/2023 in [facility]  Referring Provider Vinie Maxcy, MD     Number of Visits: 66  Reason for Discharge:  Patient reached a stable level of exercise. Patient independent in their exercise. Patient has met program and personal goals.  Smoking History:  Social History   Tobacco Use  Smoking Status Former   Current packs/day: 0.00   Types: Cigarettes   Quit date: 2002   Years since quitting: 23.6  Smokeless Tobacco Never  Tobacco Comments   Started smoking in her early 20's.   Smoked 0.5PP at her heaviest.     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  ADL [facility]:   Initial Exercise Prescription:  Initial Exercise Prescription - 12/20/23 1500       Date of Initial Exercise RX and Referring Provider   Date 12/20/23    Referring Provider Vinie Maxcy, MD    Expected Discharge Date 03/12/24      NuStep   Level 1    SPM 80    Minutes 15    METs 1.8      Recumbant Elliptical   Level 1    RPM 40    Watts 60    Minutes 15    METs 2.1      Prescription Details   Frequency (times per week) 3    Duration Progress to 30 minutes of continuous aerobic without signs/symptoms of physical distress      Intensity   THRR 40-80% of Max Heartrate 60-119    Ratings of Perceived Exertion 11-13    Perceived Dyspnea 0-4      Progression   Progression Continue progressive overload as per policy without signs/symptoms or physical distress.      Resistance Training   Training Prescription Yes    Reps 10-15          Discharge Exercise Prescription (Final Exercise Prescription Changes):  Exercise Prescription Changes - 03/31/24 1038       Response to Exercise   Blood Pressure (Admit) 108/60    Blood Pressure (Exit) 90/58    Heart  Rate (Admit) 80 bpm    Heart Rate (Exercise) 133 bpm    Heart Rate (Exit) 82 bpm    Rating of Perceived Exertion (Exercise) 11    Symptoms None    Duration Continue with 30 min of aerobic exercise without signs/symptoms of physical distress.    Intensity THRR unchanged      Progression   Progression Continue to progress workloads to maintain intensity without signs/symptoms of physical distress.    Average METs 3.6      Resistance Training   Training Prescription Yes    Weight 3 lbs    Reps 10-15    Time 5 Minutes      Interval Training   Interval Training No      Treadmill   MPH 3.5    Grade 1    Minutes 15    METs 4.16      NuStep   Level 5    SPM 122    Minutes 15    METs 3.1      Home Exercise Plan   Plans to continue exercise at Home (comment)   Walking, fitness videos   Frequency Add 4 additional days to program exercise sessions.  Initial Home Exercises Provided 01/25/24          Functional Capacity:  6 Minute Walk     Row Name 12/20/23 1601 04/07/24 1049       6 Minute Walk   Phase Initial Discharge    Distance 1745 feet 1920 feet    Distance % Change -- 10.03 %    Distance Feet Change -- 175 ft    Walk Time 6 minutes 6 minutes    # of Rest Breaks 0 0    MPH 3.3 3.64    METS 3.64 4.28    RPE 7 7    Perceived Dyspnea  0 0    VO2 Peak 12.75 14.97    Symptoms Yes (comment) No    Comments Post walk test patient states she felt swimmy headed, patient denigns SOB, light headed feeling or visual changes. Resolved within 30 seconds. Orthostatics seated 98/63 standing 93/63 asymptomatic. --    Resting HR 75 bpm 60 bpm    Resting BP 104/60 98/60    Resting Oxygen Saturation  98 % --    Exercise Oxygen Saturation  during 6 min walk 100 % --    Max Ex. HR 88 bpm 119 bpm    Max Ex. BP 116/62 128/62    2 Minute Post BP 100/60 94/62       Psychological, QOL, Others - Outcomes: PHQ 2/9:    04/08/2024    4:24 PM 12/20/2023    4:26 PM 10/18/2023    10:05 AM 09/06/2023    1:26 PM 08/09/2023    3:01 PM  Depression screen PHQ 2/9  Decreased Interest 0 0 0 0 0  Down, Depressed, Hopeless 0 0 0 0 0  PHQ - 2 Score 0 0 0 0 0  Altered sleeping 1 2     Tired, decreased energy 0 3     Change in appetite 0 0     Feeling bad or failure about yourself  0 0     Trouble concentrating 0 0     Moving slowly or fidgety/restless 0 0     Suicidal thoughts 0 0     PHQ-9 Score 1 5     Difficult doing work/chores Not difficult at all Somewhat difficult       Quality of Life:  Quality of Life - 04/08/24 1623       Quality of Life   Select Quality of Life      Quality of Life Scores   Health/Function Pre 18.03 %    Health/Function Post 25.67 %    Health/Function % Change 42.37 %    Socioeconomic Pre 25.25 %    Socioeconomic Post 27.58 %    Socioeconomic % Change  9.23 %    Psych/Spiritual Pre 16.5 %    Psych/Spiritual Post 23 %    Psych/Spiritual % Change 39.39 %    Family Pre 21 %    Family Post 26.38 %    Family % Change 25.62 %    GLOBAL Pre 19.42 %    GLOBAL Post 25.53 %    GLOBAL % Change 31.46 %          Personal Goals: Goals established at orientation with interventions provided to work toward goal.  Personal Goals and Risk Factors at Admission - 12/20/23 1531       Core Components/Risk Factors/Patient Goals on Admission    Weight Management Yes    Intervention Weight Management: Develop a combined nutrition  and exercise program designed to reach desired caloric intake, while maintaining appropriate intake of nutrient and fiber, sodium and fats, and appropriate energy expenditure required for the weight goal.;Weight Management: Provide education and appropriate resources to help participant work on and attain dietary goals.    Expected Outcomes Short Term: Continue to assess and modify interventions until short term weight is achieved;Long Term: Adherence to nutrition and physical activity/exercise program aimed toward  attainment of established weight goal;Understanding recommendations for meals to include 15-35% energy as protein, 25-35% energy from fat, 35-60% energy from carbohydrates, less than 200mg  of dietary cholesterol, 20-35 gm of total fiber daily;Understanding of distribution of calorie intake throughout the day with the consumption of 4-5 meals/snacks    Heart Failure Yes    Intervention Provide a combined exercise and nutrition program that is supplemented with education, support and counseling about heart failure. Directed toward relieving symptoms such as shortness of breath, decreased exercise tolerance, and extremity edema.    Expected Outcomes Improve functional capacity of life;Short term: Attendance in program 2-3 days a week with increased exercise capacity. Reported lower sodium intake. Reported increased fruit and vegetable intake. Reports medication compliance.;Short term: Daily weights obtained and reported for increase. Utilizing diuretic protocols set by physician.;Long term: Adoption of self-care skills and reduction of barriers for early signs and symptoms recognition and intervention leading to self-care maintenance.    Hypertension Yes    Intervention Provide education on lifestyle modifcations including regular physical activity/exercise, weight management, moderate sodium restriction and increased consumption of fresh fruit, vegetables, and low fat dairy, alcohol moderation, and smoking cessation.;Monitor prescription use compliance.    Expected Outcomes Long Term: Maintenance of blood pressure at goal levels.;Short Term: Continued assessment and intervention until BP is < 140/76mm HG in hypertensive participants. < 130/61mm HG in hypertensive participants with diabetes, heart failure or chronic kidney disease.    Lipids Yes    Intervention Provide education and support for participant on nutrition & aerobic/resistive exercise along with prescribed medications to achieve LDL 70mg , HDL  >40mg .    Expected Outcomes Short Term: Participant states understanding of desired cholesterol values and is compliant with medications prescribed. Participant is following exercise prescription and nutrition guidelines.;Long Term: Cholesterol controlled with medications as prescribed, with individualized exercise RX and with personalized nutrition plan. Value goals: LDL < 70mg , HDL > 40 mg.    Stress Yes    Intervention Offer individual and/or small group education and counseling on adjustment to heart disease, stress management and health-related lifestyle change. Teach and support self-help strategies.;Refer participants experiencing significant psychosocial distress to appropriate mental health specialists for further evaluation and treatment. When possible, include family members and significant others in education/counseling sessions.    Expected Outcomes Long Term: Emotional wellbeing is indicated by absence of clinically significant psychosocial distress or social isolation.;Short Term: Participant demonstrates changes in health-related behavior, relaxation and other stress management skills, ability to obtain effective social support, and compliance with psychotropic medications if prescribed.    Personal Goal Other Yes    Personal Goal Patient wants to know limits to exercise, increase strength, stamina. To feel safe getting a good workout    Intervention Will continue to monitor pt and progress workloads as tolerated without sign or symptom    Expected Outcomes Pt will achieve her goals and gain strength           Personal Goals Discharge:  Goals and Risk Factor Review     Row Name 01/15/24 1316 02/11/24 1846 03/04/24 1706  04/08/24 1613       Core Components/Risk Factors/Patient Goals Review   Personal Goals Review Weight Management/Obesity;Stress Weight Management/Obesity;Stress Weight Management/Obesity;Stress Weight Management/Obesity;Stress    Review Carley is off to a good start  to exercise at cardiac rehab. resting systolic BP's have been in the 90's. Carley has been asymptonmatic and encouraged to drink more water as Carley is not taking any blood pressure medications at this time resting systolic BP's remains in the 90's. Trish returned to exercis on 02/11/24 after complaining of feeling lightheaded. no reports of symptoms will continue to monitor, resting systolic BP's remains in the 90's. Trish remains asymptomatic with exercise. Vital signs have been stable. Carley has increased her workloads. resting systolic BP's remains in the 90's. Trish remains asymptomatic with exercise. Vital signs remain stable. Carley will complete cardiac rehab on 04/11/24    Expected Outcomes Trish will continue to participate in cardiac rehab for exercise, nutrition and lifestyle modifications Carley will continue to participate in cardiac rehab for exercise, nutrition and lifestyle modifications Carley will continue to participate in cardiac rehab for exercise, nutrition and lifestyle modifications Carley will continue to participate in cardiac rehab for exercise, nutrition and lifestyle modifications       Exercise Goals and Review:  Exercise Goals     Row Name 12/20/23 1528             Exercise Goals   Increase Physical Activity Yes       Intervention Provide advice, education, support and counseling about physical activity/exercise needs.;Develop an individualized exercise prescription for aerobic and resistive training based on initial evaluation findings, risk stratification, comorbidities and participant's personal goals.       Expected Outcomes Short Term: Attend rehab on a regular basis to increase amount of physical activity.;Long Term: Exercising regularly at least 3-5 days a week.;Long Term: Add in home exercise to make exercise part of routine and to increase amount of physical activity.       Increase Strength and Stamina Yes       Intervention Provide advice, education, support  and counseling about physical activity/exercise needs.;Develop an individualized exercise prescription for aerobic and resistive training based on initial evaluation findings, risk stratification, comorbidities and participant's personal goals.       Expected Outcomes Short Term: Increase workloads from initial exercise prescription for resistance, speed, and METs.;Short Term: Perform resistance training exercises routinely during rehab and add in resistance training at home;Long Term: Improve cardiorespiratory fitness, muscular endurance and strength as measured by increased METs and functional capacity ( )       Able to understand and use rate of perceived exertion (RPE) scale Yes       Intervention Provide education and explanation on how to use RPE scale       Expected Outcomes Long Term:  Able to use RPE to guide intensity level when exercising independently;Short Term: Able to use RPE daily in rehab to express subjective intensity level       Knowledge and understanding of Target Heart Rate Range (THRR) Yes       Intervention Provide education and explanation of THRR including how the numbers were predicted and where they are located for reference       Expected Outcomes Short Term: Able to state/look up THRR;Short Term: Able to use daily as guideline for intensity in rehab;Long Term: Able to use THRR to govern intensity when exercising independently       Understanding of Exercise Prescription Yes  Intervention Provide education, explanation, and written materials on patient's individual exercise prescription       Expected Outcomes Short Term: Able to explain program exercise prescription;Long Term: Able to explain home exercise prescription to exercise independently          Exercise Goals Re-Evaluation:  Exercise Goals Re-Evaluation     Row Name 12/28/23 1131 01/25/24 1049 02/22/24 1133 03/03/24 1046 03/05/24 1127     Exercise Goal Re-Evaluation   Exercise Goals Review Increase  Physical Activity;Increase Strength and Stamina;Able to understand and use rate of perceived exertion (RPE) scale Increase Physical Activity;Increase Strength and Stamina;Able to understand and use rate of perceived exertion (RPE) scale;Able to check pulse independently;Knowledge and understanding of Target Heart Rate Range (THRR);Understanding of Exercise Prescription Increase Physical Activity;Increase Strength and Stamina;Able to understand and use rate of perceived exertion (RPE) scale;Able to check pulse independently;Knowledge and understanding of Target Heart Rate Range (THRR);Understanding of Exercise Prescription Increase Physical Activity;Increase Strength and Stamina;Able to understand and use rate of perceived exertion (RPE) scale;Able to check pulse independently;Knowledge and understanding of Target Heart Rate Range (THRR);Understanding of Exercise Prescription Increase Physical Activity;Increase Strength and Stamina;Able to understand and use rate of perceived exertion (RPE) scale;Able to check pulse independently;Knowledge and understanding of Target Heart Rate Range (THRR);Understanding of Exercise Prescription   Comments Laurelle was able to understand and use RPE scale appropriately. Reviewed exercise prescription with Trish. She is walking daily 45 minutes, achieving 5,000 steps. She previously worked out to [employer] for 45 minutes but was only able to achieve 5-10 minutes when she tried a workout recently. Her goal is to be able to do the 45 minute workout again. She wants to be able to exercise without shortness of breath. Carley continues to make excellent progress with exercise. Increasing workloads appropriately. Carley states that she still has difficulty walking up hills/ inclines. We discussed adding the treadmill with incline to her exercise prescription, and she will switch from the recumbent elliptical machine to the TM and increase her level on the recumbent stepper. She  hasn't resumed [employer] workouts at home but plans to this week. She would like to extend to make up missed sessions. Will extend to 04/07/24. Trish started the treadmill today to help build endurance walking up hils/ inclines at home. She tolerated 3.3-3.5 mph with 1% incline well.   Expected Outcomes Progress workloads as tolerated to help improve cardiorespiratory fitness. Be able to exercise without SOB. Be able to complete [employer] aerobics workout. Continue to progress workloads as tolerated to increase strengthe and stamina. Carley will switch to the TM next session with incline to help build endurance walking up hills/ inclines. Continue progression on the TM to build endurance walking up hills and inclines.    Row Name 04/02/24 1122 04/07/24 1056 04/09/24 1123         Exercise Goal Re-Evaluation   Exercise Goals Review Increase Physical Activity;Increase Strength and Stamina;Able to understand and use rate of perceived exertion (RPE) scale;Able to check pulse independently;Knowledge and understanding of Target Heart Rate Range (THRR);Understanding of Exercise Prescription Increase Physical Activity;Increase Strength and Stamina;Able to understand and use rate of perceived exertion (RPE) scale;Able to check pulse independently;Knowledge and understanding of Target Heart Rate Range (THRR);Understanding of Exercise Prescription Increase Physical Activity;Increase Strength and Stamina;Able to understand and use rate of perceived exertion (RPE) scale;Able to check pulse independently;Knowledge and understanding of Target Heart Rate Range (THRR);Understanding of Exercise Prescription     Comments Carley is  scheduled to complete cardiac rehab next week. Discussed her exercise plan. She will resume her workout videos and plans to start this week. Trish really feels her participation in the program has been beneficial for her physically with her stamina and for her morale. She will continue walking and she has  2,3, and 4 lbs weights for her resistance training. Encouraged her to continue weights 2-3 non-consecutive days, increasing sets/ reps as tolerated to help improve strength. Carley will complete the cardiac rehab program on Friday. In addition to exercise at home, she will exercise at [facility].     Expected Outcomes Trish will continue workout videos at home upon completion of the cardiac rehab program. Carley will continue workout videos at home upon completion of the cardiac rehab program. Carley will exercise at home and at fitness center to maintain health and fitness gains.        Nutrition & Weight - Outcomes:   Post Biometrics - 04/07/24 1056        Post  Biometrics   Waist Circumference 31.25 inches    Hip Circumference 37.75 inches    Waist to Hip Ratio 0.83 %    Triceps Skinfold 20 mm    Grip Strength 16 kg    Flexibility 18.75 in    Single Leg Stand 30 seconds          Nutrition:  Nutrition Therapy & Goals - 03/31/24 1047       Nutrition Therapy   Diet Heart Healthy Diet      Personal Nutrition Goals   Nutrition Goal Patient to identify strategies for reducing cardiovascular risk by attending the [employer] education and nutrition series weekly.   goal not met.   Personal Goal #2 Patient to improve diet quality by using the plate method as a guide for meal planning to include lean protein/plant protein, fruits, vegetables, whole grains, nonfat dairy as part of a well-balanced diet.   goal in progress.   Comments Goals in progress. Carley has medical history of pulmonary HTN, HFpEF, s/p mitraclip, hx gi bleed, thrombocytosis (hydroxurea). She does not attend the education component of cariac rehab. She reports that her husband recently had a stroke and he is working on weight gain. She admits to additional stress caring for him and is eating out more frequently. She is motivated to improve eating habits and opt for convenience cooking methods. She continues regular follow-up  with oncology related to iron  deficiency, thrombocytosis.She is down 1.5# since starting with our program and BP has been well controlled. Patient will benefit from participation/completion in intensive cardiac rehab and adherence to nutrition, exercise, and lifestyle modification.      Intervention Plan   Intervention Prescribe, educate and counsel regarding individualized specific dietary modifications aiming towards targeted core components such as weight, hypertension, lipid management, diabetes, heart failure and other comorbidities.;Nutrition handout(s) given to patient.    Expected Outcomes Short Term Goal: Understand basic principles of dietary content, such as calories, fat, sodium, cholesterol and nutrients.;Long Term Goal: Adherence to prescribed nutrition plan.          Nutrition Discharge:  Nutrition Assessments - 04/10/24 0916       Rate Your Plate Scores   Post Score 75          Education Questionnaire Score:  Knowledge Questionnaire Score - 04/08/24 1621       Knowledge Questionnaire Score   Pre Score 23/24    Post Score 24/24  Goals reviewed with patient; copy given to patient.Pt graduates from  Intensive/Traditional cardiac rehab program on 04/09/24  with completion of  34  exercise and  2 education sessions. Pt maintained good attendance and progressed nicely during their participation in rehab as evidenced by increased MET level. Trish increased her distance on her post exercise walk test by 175 feet  Medication list reconciled. Repeat  PHQ score- 1 .  Pt has made significant lifestyle changes and should be commended for their success. Trish  achieved her goals during cardiac rehab.  Pt plans to continue exercise at [facility], walking and aerobic exercise videos at home. We are proud of Trish's progress!Hadassah Elpidio Quan RN BSN

## 2024-04-10 ENCOUNTER — Inpatient Hospital Stay (HOSPITAL_BASED_OUTPATIENT_CLINIC_OR_DEPARTMENT_OTHER): Admitting: Hematology and Oncology

## 2024-04-10 ENCOUNTER — Inpatient Hospital Stay: Attending: Hematology and Oncology

## 2024-04-10 VITALS — BP 101/60 | HR 71 | Temp 98.0°F | Resp 18 | Ht 66.0 in | Wt 142.0 lb

## 2024-04-10 DIAGNOSIS — K5520 Angiodysplasia of colon without hemorrhage: Secondary | ICD-10-CM | POA: Diagnosis present

## 2024-04-10 DIAGNOSIS — D500 Iron deficiency anemia secondary to blood loss (chronic): Secondary | ICD-10-CM | POA: Diagnosis present

## 2024-04-10 DIAGNOSIS — Z87891 Personal history of nicotine dependence: Secondary | ICD-10-CM | POA: Insufficient documentation

## 2024-04-10 DIAGNOSIS — Z79899 Other long term (current) drug therapy: Secondary | ICD-10-CM | POA: Insufficient documentation

## 2024-04-10 DIAGNOSIS — Z7902 Long term (current) use of antithrombotics/antiplatelets: Secondary | ICD-10-CM | POA: Insufficient documentation

## 2024-04-10 DIAGNOSIS — Z7964 Long term (current) use of myelosuppressive agent: Secondary | ICD-10-CM | POA: Diagnosis not present

## 2024-04-10 DIAGNOSIS — D473 Essential (hemorrhagic) thrombocythemia: Secondary | ICD-10-CM | POA: Insufficient documentation

## 2024-04-10 LAB — CBC WITH DIFFERENTIAL (CANCER CENTER ONLY)
Abs Immature Granulocytes: 0.02 K/uL (ref 0.00–0.07)
Basophils Absolute: 0 K/uL (ref 0.0–0.1)
Basophils Relative: 1 %
Eosinophils Absolute: 0 K/uL (ref 0.0–0.5)
Eosinophils Relative: 0 %
HCT: 35.5 % — ABNORMAL LOW (ref 36.0–46.0)
Hemoglobin: 12.4 g/dL (ref 12.0–15.0)
Immature Granulocytes: 0 %
Lymphocytes Relative: 14 %
Lymphs Abs: 0.7 K/uL (ref 0.7–4.0)
MCH: 45.1 pg — ABNORMAL HIGH (ref 26.0–34.0)
MCHC: 34.9 g/dL (ref 30.0–36.0)
MCV: 129.1 fL — ABNORMAL HIGH (ref 80.0–100.0)
Monocytes Absolute: 0.5 K/uL (ref 0.1–1.0)
Monocytes Relative: 11 %
Neutro Abs: 3.5 K/uL (ref 1.7–7.7)
Neutrophils Relative %: 74 %
Platelet Count: 418 K/uL — ABNORMAL HIGH (ref 150–400)
RBC: 2.75 MIL/uL — ABNORMAL LOW (ref 3.87–5.11)
RDW: 13.2 % (ref 11.5–15.5)
WBC Count: 4.7 K/uL (ref 4.0–10.5)
nRBC: 0 % (ref 0.0–0.2)

## 2024-04-10 LAB — FERRITIN: Ferritin: 48 ng/mL (ref 11–307)

## 2024-04-10 LAB — CMP (CANCER CENTER ONLY)
ALT: 14 U/L (ref 0–44)
AST: 21 U/L (ref 15–41)
Albumin: 4.6 g/dL (ref 3.5–5.0)
Alkaline Phosphatase: 64 U/L (ref 38–126)
Anion gap: 5 (ref 5–15)
BUN: 14 mg/dL (ref 8–23)
CO2: 30 mmol/L (ref 22–32)
Calcium: 9.9 mg/dL (ref 8.9–10.3)
Chloride: 104 mmol/L (ref 98–111)
Creatinine: 0.78 mg/dL (ref 0.44–1.00)
GFR, Estimated: >60 mL/min (ref >60)
Glucose, Bld: 101 mg/dL — ABNORMAL HIGH (ref 70–99)
Potassium: 4 mmol/L (ref 3.5–5.1)
Sodium: 139 mmol/L (ref 135–145)
Total Bilirubin: 0.4 mg/dL (ref 0.0–1.2)
Total Protein: 6.9 g/dL (ref 6.5–8.1)

## 2024-04-10 NOTE — Progress Notes (Signed)
 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 Federico  10/26/2021: WBC 6.7, Hgb 13.3, MCV 121.7, Plt 204 03/10/2022: WBC 3.7, Hgb 11.2, MCV 120, Plt 261. Decrease hydroxyurea  to 1000 mg BID due to cytopenias.  06/24/2022-06/27/2022: Admitted with worsening GI bleed. Hydroxyurea  held.  08/01/2022: WBC 4.4, hemoglobin 9.8, MCV 105.1, and platelets of 190 02/26/2023: WBC 7.3, Hgb 12.5, MCV 109.1, Plt 807. Increase hydroxyurea  to 1000 mg daily with 1500 mg every other day 04/27/2023: WBC 4.8, Hgb 12.5, MCV 111.3, Plt 528. Increase hydroxyurea  to 1500 mg daily 06/20/2023: Return dose to hydroxyurea  500 mg twice daily 07/17/2023: increased to 1500 mg hydroxyurea  daily (1000 in PM and 500 in AM)   Interval History:  Kristina Osborne 73 y.o. female with medical history significant for essential thrombocytosis who presents for a follow up visit. The patient's last visit was on 01/30/2024. In the interim since the last visit Ms. Dudek has continued 1500 mg hydroxyurea  daily (1000 in PM and 500 in AM) on T Th S S and 2000 mg PO daily ( 1000 mg BID) on MWF.   On exam today Kristina Osborne reports she has been well overall in the room since her last visit with stable health.  She is currently taking the hydroxyurea  as prescribed.  She reports that she has good energy and is graduating from cardiac rehab later this week.  She reports that she will be joining a new fitness center and be exercising 3 times a week.  She notes her strength and  energy are improving.  She reports she is eating well and has had no overt signs of bleeding such as nosebleeds, gum bleeding, or dark/maroon stools.  She continues taking her Plavix  daily.  She notes that she does have a little bit of gastritis and burning in her stomach but has also been drinking more ice sodas.  She reports that she does have some occasional loose stools but no nausea or vomiting.  Overall she is willing and able to continue hydroxyurea  therapy at this time.  A full 10 point ROS is otherwise negative.  MEDICAL HISTORY:  Past Medical History:  Diagnosis Date   ABLA (acute blood loss anemia) 04/16/2022   Congenital malformation of intestinal fixation    Gastritis    Gastroesophageal reflux disease without esophagitis 09/10/2021   IDA (iron  deficiency anemia)    Mitral valve prolapse    Persistent hyperplasia of thymus (HCC)    S/P Thymectomy   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 Wendel   Thrombocytosis     SURGICAL HISTORY: Past Surgical History:  Procedure Laterality Date   BIOPSY  06/26/2022   Procedure: BIOPSY;  Surgeon: Stacia Glendia BRAVO, MD;  Location: [facility];  Service: Gastroenterology;;   COLONOSCOPY N/A 04/17/2022   Procedure: COLONOSCOPY;  Surgeon: Albertus Gordy HERO, MD;  Location: [facility];  Service: Gastroenterology;  Laterality: N/A;   COLONOSCOPY WITH PROPOFOL  N/A 06/26/2022   Procedure: COLONOSCOPY WITH PROPOFOL ;  Surgeon: Stacia Glendia BRAVO, MD;  Location: [facility];  Service: Gastroenterology;  Laterality: N/A;   ENTEROSCOPY N/A 04/17/2022   Procedure: ENTEROSCOPY;  Surgeon: Albertus Gordy HERO, MD;  Location: [facility];  Service: Gastroenterology;  Laterality: N/A;   ENTEROSCOPY N/A 06/26/2022   Procedure: ENTEROSCOPY;  Surgeon: Stacia Glendia BRAVO, MD;  Location: [facility];  Service: Gastroenterology;  Laterality: N/A;   GIVENS CAPSULE STUDY N/A  06/26/2022   Procedure: GIVENS CAPSULE STUDY;  Surgeon: Stacia Glendia BRAVO, MD;  Location: [facility];  Service: Gastroenterology;  Laterality: N/A;   HEMOSTASIS CLIP PLACEMENT  04/17/2022   Procedure: HEMOSTASIS CLIP PLACEMENT;  Surgeon: Albertus Gordy HERO, MD;  Location: [facility];  Service: Gastroenterology;;   HEMOSTASIS CLIP PLACEMENT  06/26/2022   Procedure: HEMOSTASIS CLIP PLACEMENT;  Surgeon: Stacia Glendia BRAVO, MD;  Location: [facility];  Service: Gastroenterology;;   HOT HEMOSTASIS N/A 04/17/2022   Procedure: HOT HEMOSTASIS (ARGON PLASMA COAGULATION/BICAP);  Surgeon: Albertus Gordy HERO, MD;  Location: [facility];  Service: Gastroenterology;  Laterality: N/A;   HOT HEMOSTASIS N/A 06/26/2022   Procedure: HOT HEMOSTASIS (ARGON PLASMA COAGULATION/BICAP);  Surgeon: Stacia Glendia BRAVO, MD;  Location: [facility];  Service: Gastroenterology;  Laterality: N/A;   POLYPECTOMY  04/17/2022   Procedure: POLYPECTOMY;  Surgeon: Albertus Gordy HERO, MD;  Location: [facility];  Service: Gastroenterology;;   POLYPECTOMY  06/26/2022   Procedure: POLYPECTOMY;  Surgeon: Stacia Glendia BRAVO, MD;  Location: [facility];  Service: Gastroenterology;;   RIGHT/LEFT HEART CATH AND CORONARY ANGIOGRAPHY N/A 11/01/2023   Procedure: RIGHT/LEFT HEART CATH AND CORONARY ANGIOGRAPHY;  Surgeon: Darron Deatrice LABOR, MD;  Location: [facility];  Service: Cardiovascular;  Laterality: N/A;   SMALL INTESTINE SURGERY  1994   SUBMUCOSAL TATTOO INJECTION  06/26/2022   Procedure: SUBMUCOSAL TATTOO INJECTION;  Surgeon: Stacia Glendia BRAVO, MD;  Location: [facility];  Service: Gastroenterology;;   THYMECTOMY N/A    TRANSCATHETER MITRAL EDGE TO EDGE REPAIR N/A 11/28/2023   Procedure: TRANSCATHETER MITRAL EDGE TO EDGE REPAIR;  Surgeon: Wendel Lurena POUR, MD;  Location: [facility];  Service: Cardiovascular;  Laterality: N/A;   TRANSESOPHAGEAL ECHOCARDIOGRAM (CATH LAB) N/A 10/24/2023   Procedure: TRANSESOPHAGEAL  ECHOCARDIOGRAM;  Surgeon: Mona Vinie BROCKS, MD;  Location: [facility];  Service: Cardiovascular;  Laterality: N/A;   TRANSESOPHAGEAL ECHOCARDIOGRAM (CATH LAB) N/A 11/28/2023   Procedure: TRANSESOPHAGEAL ECHOCARDIOGRAM;  Surgeon: Wendel Lurena POUR, MD;  Location: [facility];  Service: Cardiovascular;  Laterality: N/A;    SOCIAL HISTORY: Social History   Socioeconomic History   Marital status: Married    Spouse name: Not on file   Number of children: 0   Years of education: Not on file   Highest education level: Not on file  Occupational History   Not on file  Tobacco Use   Smoking status: Former    Current packs/day: 0.00    Types: Cigarettes    Quit date: 2002    Years since quitting: 23.6   Smokeless tobacco: Never   Tobacco comments:    Started smoking in her early 34's.    Smoked 0.5PP at her heaviest.   Vaping Use   Vaping status: Never Used  Substance and Sexual Activity   Alcohol use: Not Currently   Drug use: Never   Sexual activity: Yes    Partners: Male  Other Topics Concern   Not on file  Social History Narrative   Not on file   Social Drivers of Health  Financial Resource Strain: Not on file  Food Insecurity: No Food Insecurity (11/28/2023)   Hunger Vital Sign    Worried About Running Out of Food in the Last Year: Never true    Ran Out of Food in the Last Year: Never true  Transportation Needs: No Transportation Needs (11/28/2023)   PRAPARE - Administrator, Civil Service (Medical): No    Lack of Transportation (Non-Medical): No  Physical Activity: Not on file  Stress: Not on file  Social Connections: Socially Integrated (11/28/2023)   Social Connection and Isolation Panel    Frequency of Communication with Friends and Family: More than three times a week    Frequency of Social Gatherings with Friends and Family: Once a week    Attends Religious Services: More than 4 times per year    Active Member of [employer] or Organizations: Yes     Attends Engineer, structural: More than 4 times per year    Marital Status: Married  Catering manager Violence: Not At Risk (11/28/2023)   Humiliation, Afraid, Rape, and Kick questionnaire    Fear of Current or Ex-Partner: No    Emotionally Abused: No    Physically Abused: No    Sexually Abused: No    FAMILY HISTORY: Family History  Problem Relation Age of Onset   Heart disease Mother    Valvular heart disease Mother    Healthy Sister    Alcoholism Half-Sister     ALLERGIES:  is allergic to aspirin and sulfa antibiotics.  MEDICATIONS:  Current Outpatient Medications  Medication Sig Dispense Refill   acetaminophen  (TYLENOL ) 500 MG tablet Take 1,000 mg by mouth every 6 (six) hours as needed for headache (pain.).     amoxicillin  (AMOXIL ) 500 MG tablet Take 4 tablets (2,000 mg total) by mouth as directed. 1 hour prior to dental work including cleanings 12 tablet 12   cholestyramine  light (PREVALITE ) 4 g packet Take 2 g by mouth 2 (two) times daily.     clopidogrel  (PLAVIX ) 75 MG tablet Take 1 tablet (75 mg total) by mouth daily with breakfast. 90 tablet 1   dicyclomine  (BENTYL ) 20 MG tablet Take 1 tablet (20 mg total) by mouth 2 (two) times daily. (Patient not taking: Reported on 04/09/2024) 20 tablet 0   famotidine  (PEPCID ) 40 MG tablet TAKE 1 TABLET(40 MG) BY MOUTH DAILY 90 tablet 0   folic acid  (FOLVITE ) 1 MG tablet TAKE 1 TABLET(1 MG) BY MOUTH DAILY 90 tablet 1   furosemide  (LASIX ) 40 MG tablet Take 1 tablet (40 mg total) by mouth as needed for fluid or edema. 90 tablet 3   Glucosamine-Chondroitin (COSAMIN DS PO) Take 1 tablet by mouth daily.     hydroxyurea  (HYDREA ) 500 MG capsule Take 2 capsules (1,000 mg total) by mouth 2 (two) times daily. May take with food to minimize GI side effects. 120 capsule 1   Lycopene 10 MG CAPS Take 10 mg by mouth daily. (Patient not taking: Reported on 04/09/2024)     sucralfate  (CARAFATE ) 1 g tablet TAKE 1 TABLET(1 GRAM) BY MOUTH FOUR TIMES  DAILY AT BEDTIME WITH MEALS 360 tablet 1   No current facility-administered medications for this visit.    REVIEW OF SYSTEMS:   Constitutional: ( - ) fevers, ( - )  chills , ( - ) night sweats Eyes: ( - ) blurriness of vision, ( - ) double vision, ( - ) watery eyes Ears, nose, mouth, throat, and face: ( - ) mucositis, ( - )  sore throat Respiratory: ( - ) cough, ( - ) dyspnea, ( - ) wheezes Cardiovascular: ( - ) palpitation, ( - ) chest discomfort, ( - ) lower extremity swelling Gastrointestinal:  ( - ) nausea, ( - ) heartburn, ( - ) change in bowel habits Skin: ( - ) abnormal skin rashes Lymphatics: ( - ) new lymphadenopathy, ( - ) easy bruising Neurological: ( - ) numbness, ( - ) tingling, ( - ) new weaknesses Behavioral/Psych: ( - ) mood change, ( - ) new changes  All other systems were reviewed with the patient and are negative.  PHYSICAL EXAMINATION:  Vitals:   04/10/24 1459  BP: 101/60  Pulse: 71  Resp: 18  Temp: 98 F (36.7 C)  SpO2: 100%        GENERAL: well appearing elderly Caucasian female, in NAD  SKIN: skin color, texture, turgor are normal, no rashes or significant lesions EYES: conjunctiva are pink and non-injected, sclera clear LUNGS: clear to auscultation and percussion with normal breathing effort HEART: regular rate & rhythm and no murmurs and no lower extremity edema Musculoskeletal: no cyanosis of digits and no clubbing  PSYCH: alert & oriented x 3, fluent speech NEURO: no focal motor/sensory deficits   LABORATORY DATA:  I have reviewed the data as listed    Latest Ref Rng & Units 04/10/2024    2:21 PM 03/25/2024   11:17 AM 03/04/2024    1:50 PM  CBC  WBC 4.0 - 10.5 K/uL 4.7  3.2  3.5   Hemoglobin 12.0 - 15.0 g/dL 87.5  88.7  88.0   Hematocrit 36.0 - 46.0 % 35.5  32.8  33.6   Platelets 150 - 400 K/uL 418  353  397        Latest Ref Rng & Units 04/10/2024    2:21 PM 03/25/2024   11:17 AM 03/04/2024    1:50 PM  CMP  Glucose 70 - 99 mg/dL 898   97  882   BUN 8 - 23 mg/dL 14  11  12    Creatinine 0.44 - 1.00 mg/dL 9.21  9.21  9.25   Sodium 135 - 145 mmol/L 139  139  140   Potassium 3.5 - 5.1 mmol/L 4.0  4.3  4.0   Chloride 98 - 111 mmol/L 104  105  106   CO2 22 - 32 mmol/L 30  31  29    Calcium  8.9 - 10.3 mg/dL 9.9  9.9  89.8   Total Protein 6.5 - 8.1 g/dL 6.9  6.8  6.7   Total Bilirubin 0.0 - 1.2 mg/dL 0.4  0.6  0.5   Alkaline Phos 38 - 126 U/L 64  59  59   AST 15 - 41 U/L 21  21  22    ALT 0 - 44 U/L 14  13  13     RADIOGRAPHIC STUDIES: DG Foot Complete Left Result Date: 03/24/2024 Please see detailed radiograph report in office note.   ASSESSMENT & PLAN Kristina Osborne 73 y.o. female with medical history significant for essential thrombocytosis who presents for a follow up visit.   After review of the labs, review of the records, and discussion with the patient the patients findings are most consistent with thrombocytosis, reported due to essential thrombocythemia. We have received the records showing that she has a JAK2 mutation.  Additionally she will require a bone marrow biopsy in order to assure that essential thrombocythemia is the diagnosis.  In the interim we will have the patient  continue her hydroxyurea  as previously prescribed by her provider in [place] .  # Essential thrombocythemia, JAK2 Positive  -- At this time we will need to confirm that the patient has essential thrombocythemia.  She will require a bone marrow biopsy to confirm this diagnosis --JAK2 testing received from patient's prior hematologist. Confirmed JAK2 mutation. Plan:   -- If and when the patient is agreeable we will schedule her for a bone marrow biopsy. --Hydroxyurea  was previously held due to GI bleed and anemia.  Anemia is improving with IV iron  therapy and therefore we can cotinue hydroxyurea  at this time. --previously on 1500 mg hydroxyurea  daily (1000 in PM and 500 in AM) .  Will increase to 1000 mg twice daily as her platelets are  currently above target. --I would be very hesitant to transition her to anagrelide in the future given her proclivity for bleeding. -- Labs today show WBC 4.7, Hgb 12.4, MCV 129.1, Plt 418. Cr 0.78 --continue with labs q 4 weeks followed by a clinic visit in 8 weeks.  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  More recent doses were performed on 11/07/2022, 01/18/2023, and most recently 06/26/2023. Feraheme administered on 2/7-2/14/2025.  --Patient has history of GI bleeding. Patient notes no active maroon/red/black stools.  Appreciate assistance of GI in the management of this case. --Labs today as noted above.  -- If the patient were to have recurrent anemia or persistent iron  deficiency will order another dose of IV iron  --continue to follow with above labs.   No orders of the defined types were placed in this encounter.   All questions were answered. The patient knows to call the clinic with any problems, questions or concerns.  A total of more than 30 minutes were spent on this encounter with face-to-face time and non-face-to-face time, including preparing to see the patient, ordering tests and/or medications, counseling the patient and coordination of care as outlined above.   Norleen IVAR Kidney, MD Department of Hematology/Oncology [facility] Phone: [phone] Pager: [phone] Email: [email or web address]  04/13/2024 5:31 PM

## 2024-04-11 ENCOUNTER — Encounter (HOSPITAL_COMMUNITY)
Admission: RE | Admit: 2024-04-11 | Discharge: 2024-04-11 | Disposition: A | Discharge status: Home / Self Care | Source: Ambulatory Visit | Attending: Internal Medicine | Admitting: Internal Medicine

## 2024-04-17 ENCOUNTER — Ambulatory Visit (INDEPENDENT_AMBULATORY_CARE_PROVIDER_SITE_OTHER): Admitting: Podiatry

## 2024-04-17 ENCOUNTER — Encounter: Payer: Self-pay | Admitting: Podiatry

## 2024-04-17 DIAGNOSIS — M722 Plantar fascial fibromatosis: Secondary | ICD-10-CM

## 2024-04-17 NOTE — Progress Notes (Signed)
 Follow-up plantar fasciitis of the right.  Doing much better still little bit of pain remaining says the pain that she is getting on the outside lateral aspect of the foot has been reducing.   Physical exam:  General appearance: Pleasant, and in no acute distress. AOx3.  Vascular: Pedal pulses: DP 2/4 bilaterally, PT 2/4 bilaterally. Capillary fill time immediate bilaterally.  Neurological: Negative Tinel's sign tarsal tunnel and porta pedis bilaterally.  Dermatologic:   Skin normal temperature bilaterally.  Skin normal color, tone, and texture bilaterally.   Musculoskeletal: Slight tenderness plantar medial aspect of the right heel at the plantar calcaneal tubercle.  No tenderness about the compression of the calcaneus.  No tenderness palpation of the lateral aspect of the foot.   Diagnosis: 1 plantar fasciitis right.  Plan: -Established office visit for evaluation and management level 3 - Discussed with the plantar fasciitis and appears to be mostly resolved still has a little bit of pain.  Recommended she try continue icing and exercises and use Voltaren gel on the area 3-4 times a day.  This will hopefully reduce the risk the discomfort she is on a Jak 2 inhibitor so I had rather avoid using corticosteroids at this point.  Return as needed

## 2024-04-18 ENCOUNTER — Ambulatory Visit: Admitting: Podiatry

## 2024-04-21 ENCOUNTER — Telehealth: Payer: Self-pay | Admitting: *Deleted

## 2024-04-21 NOTE — Telephone Encounter (Signed)
 Received call from pt. She states she thinks she may be having a GI bleed. As she noticed darker stools and some tachycardia. She wants to see how she does today and then get labs checked either later today or in the morning. She states she will call back to arrange if the dark stools continue.

## 2024-04-22 ENCOUNTER — Other Ambulatory Visit: Payer: Self-pay

## 2024-04-22 ENCOUNTER — Inpatient Hospital Stay (HOSPITAL_COMMUNITY)
Admission: EM | Admit: 2024-04-22 | Discharge: 2024-04-26 | DRG: 378 | Disposition: A | Discharge status: Home / Self Care | Source: Ambulatory Visit | Attending: Internal Medicine | Admitting: Internal Medicine

## 2024-04-22 ENCOUNTER — Emergency Department (HOSPITAL_COMMUNITY)

## 2024-04-22 ENCOUNTER — Telehealth: Payer: Self-pay | Admitting: Hematology and Oncology

## 2024-04-22 ENCOUNTER — Other Ambulatory Visit

## 2024-04-22 ENCOUNTER — Inpatient Hospital Stay

## 2024-04-22 ENCOUNTER — Encounter (HOSPITAL_COMMUNITY): Payer: Self-pay | Admitting: Emergency Medicine

## 2024-04-22 DIAGNOSIS — Z87891 Personal history of nicotine dependence: Secondary | ICD-10-CM | POA: Diagnosis not present

## 2024-04-22 DIAGNOSIS — D1339 Benign neoplasm of other parts of small intestine: Secondary | ICD-10-CM | POA: Diagnosis present

## 2024-04-22 DIAGNOSIS — K5521 Angiodysplasia of colon with hemorrhage: Secondary | ICD-10-CM | POA: Diagnosis present

## 2024-04-22 DIAGNOSIS — R54 Age-related physical debility: Secondary | ICD-10-CM | POA: Diagnosis present

## 2024-04-22 DIAGNOSIS — D62 Acute posthemorrhagic anemia: Secondary | ICD-10-CM | POA: Diagnosis present

## 2024-04-22 DIAGNOSIS — E785 Hyperlipidemia, unspecified: Secondary | ICD-10-CM | POA: Diagnosis present

## 2024-04-22 DIAGNOSIS — Q438 Other specified congenital malformations of intestine: Secondary | ICD-10-CM

## 2024-04-22 DIAGNOSIS — Z886 Allergy status to analgesic agent status: Secondary | ICD-10-CM | POA: Diagnosis not present

## 2024-04-22 DIAGNOSIS — D473 Essential (hemorrhagic) thrombocythemia: Secondary | ICD-10-CM

## 2024-04-22 DIAGNOSIS — L818 Other specified disorders of pigmentation: Secondary | ICD-10-CM | POA: Diagnosis not present

## 2024-04-22 DIAGNOSIS — Z882 Allergy status to sulfonamides status: Secondary | ICD-10-CM | POA: Diagnosis not present

## 2024-04-22 DIAGNOSIS — D7589 Other specified diseases of blood and blood-forming organs: Secondary | ICD-10-CM | POA: Diagnosis present

## 2024-04-22 DIAGNOSIS — Z79899 Other long term (current) drug therapy: Secondary | ICD-10-CM

## 2024-04-22 DIAGNOSIS — Z7902 Long term (current) use of antithrombotics/antiplatelets: Secondary | ICD-10-CM | POA: Diagnosis not present

## 2024-04-22 DIAGNOSIS — D500 Iron deficiency anemia secondary to blood loss (chronic): Secondary | ICD-10-CM

## 2024-04-22 DIAGNOSIS — Z9049 Acquired absence of other specified parts of digestive tract: Secondary | ICD-10-CM

## 2024-04-22 DIAGNOSIS — Z980 Intestinal bypass and anastomosis status: Secondary | ICD-10-CM | POA: Diagnosis not present

## 2024-04-22 DIAGNOSIS — K5520 Angiodysplasia of colon without hemorrhage: Secondary | ICD-10-CM

## 2024-04-22 DIAGNOSIS — K2289 Other specified disease of esophagus: Secondary | ICD-10-CM | POA: Diagnosis not present

## 2024-04-22 DIAGNOSIS — K921 Melena: Secondary | ICD-10-CM

## 2024-04-22 DIAGNOSIS — K922 Gastrointestinal hemorrhage, unspecified: Secondary | ICD-10-CM | POA: Diagnosis not present

## 2024-04-22 DIAGNOSIS — D649 Anemia, unspecified: Principal | ICD-10-CM

## 2024-04-22 DIAGNOSIS — I272 Pulmonary hypertension, unspecified: Secondary | ICD-10-CM | POA: Diagnosis present

## 2024-04-22 DIAGNOSIS — Z95818 Presence of other cardiac implants and grafts: Secondary | ICD-10-CM

## 2024-04-22 DIAGNOSIS — I341 Nonrheumatic mitral (valve) prolapse: Secondary | ICD-10-CM | POA: Diagnosis present

## 2024-04-22 DIAGNOSIS — I5032 Chronic diastolic (congestive) heart failure: Secondary | ICD-10-CM | POA: Diagnosis present

## 2024-04-22 DIAGNOSIS — Z8249 Family history of ischemic heart disease and other diseases of the circulatory system: Secondary | ICD-10-CM

## 2024-04-22 DIAGNOSIS — D61818 Other pancytopenia: Secondary | ICD-10-CM | POA: Diagnosis present

## 2024-04-22 DIAGNOSIS — I5030 Unspecified diastolic (congestive) heart failure: Secondary | ICD-10-CM | POA: Diagnosis not present

## 2024-04-22 DIAGNOSIS — D126 Benign neoplasm of colon, unspecified: Secondary | ICD-10-CM | POA: Diagnosis present

## 2024-04-22 DIAGNOSIS — Z7901 Long term (current) use of anticoagulants: Secondary | ICD-10-CM

## 2024-04-22 DIAGNOSIS — K6389 Other specified diseases of intestine: Secondary | ICD-10-CM | POA: Diagnosis not present

## 2024-04-22 LAB — CMP (CANCER CENTER ONLY)
ALT: 14 U/L (ref 0–44)
AST: 22 U/L (ref 15–41)
Albumin: 4 g/dL (ref 3.5–5.0)
Alkaline Phosphatase: 50 U/L (ref 38–126)
Anion gap: 5 (ref 5–15)
BUN: 14 mg/dL (ref 8–23)
CO2: 28 mmol/L (ref 22–32)
Calcium: 9.5 mg/dL (ref 8.9–10.3)
Chloride: 106 mmol/L (ref 98–111)
Creatinine: 0.67 mg/dL (ref 0.44–1.00)
GFR, Estimated: >60 mL/min (ref >60)
Glucose, Bld: 96 mg/dL (ref 70–99)
Potassium: 3.9 mmol/L (ref 3.5–5.1)
Sodium: 139 mmol/L (ref 135–145)
Total Bilirubin: 0.6 mg/dL (ref 0.0–1.2)
Total Protein: 6.3 g/dL — ABNORMAL LOW (ref 6.5–8.1)

## 2024-04-22 LAB — CBC WITH DIFFERENTIAL (CANCER CENTER ONLY)
Abs Immature Granulocytes: 0.02 K/uL (ref 0.00–0.07)
Basophils Absolute: 0 K/uL (ref 0.0–0.1)
Basophils Relative: 1 %
Eosinophils Absolute: 0 K/uL (ref 0.0–0.5)
Eosinophils Relative: 0 %
HCT: 27.8 % — ABNORMAL LOW (ref 36.0–46.0)
Hemoglobin: 9.5 g/dL — ABNORMAL LOW (ref 12.0–15.0)
Immature Granulocytes: 0 %
Lymphocytes Relative: 15 %
Lymphs Abs: 0.7 K/uL (ref 0.7–4.0)
MCH: 44.2 pg — ABNORMAL HIGH (ref 26.0–34.0)
MCHC: 34.2 g/dL (ref 30.0–36.0)
MCV: 129.3 fL — ABNORMAL HIGH (ref 80.0–100.0)
Monocytes Absolute: 0.4 K/uL (ref 0.1–1.0)
Monocytes Relative: 8 %
Neutro Abs: 3.8 K/uL (ref 1.7–7.7)
Neutrophils Relative %: 76 %
Platelet Count: 177 K/uL (ref 150–400)
RBC: 2.15 MIL/uL — ABNORMAL LOW (ref 3.87–5.11)
RDW: 13.2 % (ref 11.5–15.5)
WBC Count: 4.9 K/uL (ref 4.0–10.5)
nRBC: 0 % (ref 0.0–0.2)

## 2024-04-22 LAB — HEMOGLOBIN AND HEMATOCRIT, BLOOD
HCT: 29.5 % — ABNORMAL LOW (ref 36.0–46.0)
Hemoglobin: 9.6 g/dL — ABNORMAL LOW (ref 12.0–15.0)

## 2024-04-22 LAB — POC OCCULT BLOOD, ED: Fecal Occult Bld: POSITIVE — AB

## 2024-04-22 LAB — APTT: aPTT: 26 s (ref 24–36)

## 2024-04-22 LAB — TROPONIN T, HIGH SENSITIVITY
Troponin T High Sensitivity: <15 ng/L (ref 0–19)
Troponin T High Sensitivity: <15 ng/L (ref 0–19)

## 2024-04-22 LAB — FERRITIN: Ferritin: 37 ng/mL (ref 11–307)

## 2024-04-22 MED ORDER — TRAZODONE HCL 50 MG PO TABS
25.0000 mg | ORAL_TABLET | Freq: Every evening | ORAL | Status: DC | PRN
  Administered 2024-04-23: 25 mg via ORAL
  Filled 2024-04-22: qty 1

## 2024-04-22 MED ORDER — PANTOPRAZOLE SODIUM 40 MG IV SOLR
40.0000 mg | INTRAVENOUS | Status: DC
  Administered 2024-04-22 – 2024-04-24 (×3): 40 mg via INTRAVENOUS
  Filled 2024-04-22 (×3): qty 10

## 2024-04-22 MED ORDER — ACETAMINOPHEN 325 MG PO TABS
650.0000 mg | ORAL_TABLET | Freq: Once | ORAL | Status: AC
  Administered 2024-04-22: 650 mg via ORAL
  Filled 2024-04-22: qty 2

## 2024-04-22 MED ORDER — ALBUTEROL SULFATE (2.5 MG/3ML) 0.083% IN NEBU: 2.5000 mg | INHALATION_SOLUTION | RESPIRATORY_TRACT | Status: DC | PRN

## 2024-04-22 MED ORDER — ONDANSETRON HCL 4 MG PO TABS: 4.0000 mg | ORAL_TABLET | Freq: Four times a day (QID) | ORAL | Status: DC | PRN

## 2024-04-22 MED ORDER — ACETAMINOPHEN 325 MG PO TABS
650.0000 mg | ORAL_TABLET | Freq: Four times a day (QID) | ORAL | Status: DC | PRN
  Administered 2024-04-24 – 2024-04-26 (×2): 650 mg via ORAL
  Filled 2024-04-22 (×2): qty 2

## 2024-04-22 MED ORDER — ACETAMINOPHEN 650 MG RE SUPP
650.0000 mg | Freq: Four times a day (QID) | RECTAL | Status: DC | PRN
Start: 2024-04-22 — End: 2024-04-26

## 2024-04-22 MED ORDER — ONDANSETRON HCL 4 MG/2ML IJ SOLN: 4.0000 mg | Freq: Four times a day (QID) | INTRAMUSCULAR | Status: DC | PRN

## 2024-04-22 NOTE — ED Provider Notes (Signed)
 [place] EMERGENCY DEPARTMENT AT [facility] Provider Note   CSN: [id number] Arrival date & time: 04/22/24  1439     Patient presents with: Melena   Kristina Osborne is a 73 y.o. female with past medical history of thrombocytosis (JAK 2 mutation), MR, GERD, IDA, HFpEF, pulmonary HTN, GIB, AVM of small bowel, SB angiectasias presents to Emergency Department for evaluation of anemia, melena, generalized weakness, chest pain, lightheadedness, headache.  Reports that symptoms all started with melena on Sunday afternoon, 3 days ago.  She had 4-5 loose BM then progressing to 2 BM more liquid in consistency today.  Started having exertional shortness of breath, chest pain today.  Also has associated lightheadedness and headache located in temporal and frontal region that has been gradually worsening today.  No blurry vision.  Reports that she also noticed some hypotension of 80 systolic in the afternoon when she is normally 90/60 in the morning and 120/60 throughout the day.  Does have history of IDA with Hgb ranging from 11.2-12.5 over past 3 months.  Is not on p.o. iron  supplementation and has had iron  infusions in the past when iron  was low but has not had one recently.  Of note has a history of GIB secondary to AVM vs anastomosis of small bowel and small bowel resection in 1980s/90s.  Is currently on Plavix  and compliant. Currently following with [facility]    HPI     Prior to Admission medications   Medication Sig Start Date End Date Taking? Authorizing Provider  acetaminophen  (TYLENOL ) 500 MG tablet Take 1,000 mg by mouth every 6 (six) hours as needed for headache (pain.).    [provider]  amoxicillin  (AMOXIL ) 500 MG tablet Take 4 tablets (2,000 mg total) by mouth as directed. 1 hour prior to dental work including cleanings 12/03/23   Sebastian Lamarr SAUNDERS, PA-C  cholestyramine  light (PREVALITE ) 4 g packet Take 2 g by mouth 2 (two) times daily.    [provider]  clopidogrel  (PLAVIX ) 75 MG tablet Take 1 tablet (75 mg total) by mouth daily with breakfast. 11/30/23   Sebastian Lamarr SAUNDERS, PA-C  dicyclomine  (BENTYL ) 20 MG tablet Take 1 tablet (20 mg total) by mouth 2 (two) times daily. 02/07/24   Theotis Peers M, PA-C  famotidine  (PEPCID ) 40 MG tablet TAKE 1 TABLET(40 MG) BY MOUTH DAILY 02/19/24   Dorsey, John T IV, MD  folic acid  (FOLVITE ) 1 MG tablet TAKE 1 TABLET(1 MG) BY MOUTH DAILY 11/21/23   Dorsey, John T IV, MD  furosemide  (LASIX ) 40 MG tablet Take 1 tablet (40 mg total) by mouth as needed for fluid or edema. 12/03/23 04/09/24  Sebastian Lamarr SAUNDERS, PA-C  Glucosamine-Chondroitin (COSAMIN DS PO) Take 1 tablet by mouth daily.    [provider]  hydroxyurea  (HYDREA ) 500 MG capsule Take 2 capsules (1,000 mg total) by mouth 2 (two) times daily. May take with food to minimize GI side effects. 01/28/24   Dorsey, John T IV, MD  Lycopene 10 MG CAPS Take 10 mg by mouth daily.    [provider]  sucralfate  (CARAFATE ) 1 g tablet TAKE 1 TABLET(1 GRAM) BY MOUTH FOUR TIMES DAILY AT BEDTIME WITH MEALS 08/31/23   Dorsey, John T IV, MD    Allergies: Aspirin and Sulfa antibiotics    Review of Systems  Gastrointestinal:  Positive for blood in stool.    Updated Vital Signs BP (!) 107/91 (BP Location: Right Arm)   Pulse 77   Temp 97.9  F (36.6 C) (Oral)   Resp 16   Ht 5' 7 (1.702 m)   Wt 64.9 kg   LMP  (LMP Unknown)   SpO2 98%   BMI 22.40 kg/m   Physical Exam Vitals and nursing note reviewed.  Constitutional:      General: She is not in acute distress.    Appearance: Normal appearance.  HENT:     Head: Normocephalic and atraumatic.  Eyes:     Conjunctiva/sclera: Conjunctivae normal.  Cardiovascular:     Rate and Rhythm: Normal rate.  Pulmonary:     Effort: Pulmonary effort is normal. No respiratory distress.     Breath sounds: Normal breath sounds.  Abdominal:     General: Bowel sounds are normal. There is no distension.      Palpations: Abdomen is soft.     Tenderness: There is no abdominal tenderness. There is no guarding or rebound.  Genitourinary:    Comments: Small external nontender and nonthrombosed hemorrhoid. Small amount of dark stool obtained by DRE Skin:    Coloration: Skin is not jaundiced or pale.  Neurological:     Mental Status: She is alert and oriented to person, place, and time. Mental status is at baseline.   GU exam chaperoned with Lowery Gens NT  (all labs ordered are listed, but only abnormal results are displayed) Labs Reviewed  POC OCCULT BLOOD, ED - Abnormal; Notable for the following components:      Result Value   Fecal Occult Bld POSITIVE (*)    All other components within normal limits  APTT  TYPE AND SCREEN  TROPONIN T, HIGH SENSITIVITY  TROPONIN T, HIGH SENSITIVITY    EKG: None  Radiology: DG Chest Portable 1 View Result Date: 04/22/2024 CLINICAL DATA:  Chest pain. Decreased blood counts over the last week. EXAM: PORTABLE CHEST 1 VIEW COMPARISON:  02/07/2024 FINDINGS: Postoperative changes in the mediastinum. Calcification in the mitral valve annulus. Heart size and pulmonary vascularity are normal for technique. Lungs are clear. No pleural effusion or pneumothorax. Mediastinal contours appear intact. IMPRESSION: No active disease. Electronically Signed   By: Elsie Gravely M.D.   On: 04/22/2024 18:10     Medications Ordered in the ED  acetaminophen  (TYLENOL ) tablet 650 mg (650 mg Oral Given 04/22/24 1650)                                    Medical Decision Making Amount and/or Complexity of Data Reviewed Labs: ordered. Radiology: ordered.  Risk OTC drugs. Decision regarding hospitalization.   Patient presents to the ED for concern of melena, lightheadedness, headache, chest pain, shortness of breath, soft BP, this involves an extensive number of treatment options, and is a complaint that carries with it a high risk of complications and morbidity.  The  differential diagnosis includes GIB, symptomatic anemia, ACS, electrolyte abnormality, dehydration, pneumonia, COPD exacerbation, PE, fluid overload   Co morbidities that complicate the patient evaluation  On Plavix  History of AVM, anastomosis, SB resection   Additional history obtained:  Additional history obtained from Nursing and Outside Medical Records   External records from outside source obtained and reviewed including triage note, cardiology note from 11/26/2023, due to GI note from 10/31/2022, [facility] note from 07/05/2022   Lab Tests:  I Ordered, and personally interpreted labs.  The pertinent results include:   CBC, CMP obtained 2 hours prior to ED arrival at cancer center  notable for Hgb of 9.5 (was 12.4 twelve days ago.  Baseline 11.2-12.5 over past 3 months) Fecal occult positive Troponin WNL   Imaging Studies ordered:  I ordered imaging studies including chest x-ray I independently visualized and interpreted imaging which is notable for no active cardiopulmonary disease I agree with the radiologist interpretation   Cardiac Monitoring:  The patient was maintained on a cardiac monitor.  I personally viewed and interpreted the cardiac monitored which showed an underlying rhythm of: NSR at 73 bpm with no ST nor T wave abnormalities    Consultations Obtained:  I requested consultation with [facility] Dr. Abran via secure chat,  and discussed lab and imaging findings as well as pertinent plan -will consult on patient during admission Requested consultation with hospitalist, and discussed lab and imaging findings as well as plan and plan-Dr.Ikramullah accepts patient for admission   Problem List / ED Course:  Melena Lab work from oncologist today noting Hgb of 9.5 which is a drop from 12.4 twelve days ago. Small amount of dark stool on DRE.  Fecal occult positive. Currently hemodynamically stable with no tachycardia but did note soft pressures at home. Was soft at 99 SBP  at arrival but incresed to 107 SBP wo intervention. As she is stable at this time with no significant drop in Hgb today, do not see need for emergent transfusion of PRBC currently however think she would benefit from admission for melena and symptomatic anemia with hx of AVM with current plavix  use Follows with [facility] Dr. Aneita although had a 2nd opinion, consideration of double balloon endoscopy vs medical management with octreotide or thalidomide vs conservative management of GIB at [facility] with Dr. Charlean in 2024. They agreed to proceed with conservative management as pt has infrequent bouts of melena Continues to follow with [facility] Last colonoscopy on 04/17/22 prior end-to-side ileo-colonic anastomosis in the ascending colon  A 6 mm polyp was found in the proximal sigmoid colon that was removed Small internal hemorrhoids Last Endoscopy 04/17/22 Erythematous mucosa without active bleeding and with no stigmata of bleeding was found in the duodenal bulb. Two angioectasias with stigmata of recent bleeding (adjacent fresh blood) were found in the proximal jejunum.  A 5 mm pedunculated polyp with was found in the proximal jejunum  that was removed  CP Exertional. No current CP. Intermittent since melena started 3 days ago Cardiac workup reassuring History of MVP, MV clip, palpitations, HFpEF (on lasix ) Follows with [facility] cardiology at [facility] Last echo 10/11/2023 LVEF 60-65% Last saw cards on 10/2023 with f/u in 6 mo  SHOB Lung sounds CTAB Does not appear fluid overloaded on exam nor fluid on CXR likely 2/2 symptomatic anemia   Reevaluation:  After the interventions noted above, I reevaluated the patient and found that they have :stayed the same   Social Determinants of Health:  Former tobacco abuse    Dispostion:  After consideration of the diagnostic results and the patients response to treatment, I feel that the patent would benefit from admission, obs.    Discussed ED workup, dispo with patient expressed understand agrees with plan.  Final diagnoses:  Symptomatic anemia  Melena    ED Discharge Orders     None        Minnie Tinnie BRAVO, PA 04/22/24 1842    Darra Fonda MATSU, MD 04/26/24 [id number]

## 2024-04-22 NOTE — Telephone Encounter (Signed)
 Rescheduled appointment per patients request via incoming call. Talked with the patient and she is aware of the changes made to her upcoming appointment.

## 2024-04-22 NOTE — ED Triage Notes (Signed)
 73 y/o female comes in from the cancer center reporting her blood counts have dropped 2 points in the last week. Pt reports moderate black stool and on Plavix  for a mitral valve repair in April of this year. Pt reports generalized weakness, but denies any hx of blood transfusions

## 2024-04-22 NOTE — H&P (Signed)
 History and Physical  Kristina Osborne FMW:[id number] DOB: [birth year] DOA: 04/22/2024  PCP: Norleen Lynwood ORN, MD   Chief Complaint: Melena  HPI: Kristina Osborne is a 73 y.o. female with medical history significant for GERD, iron  deficiency anemia, essential thrombocytosis, multiple GI bleeds including due to AVM currently on monotherapy with Plavix  being admitted to the hospital with melena and symptomatic anemia.  She follows up routinely with her hematologist, most recently about 10 days ago her hemoglobin was 12, starting 2 days ago she started having melanotic stools with some associated diarrhea and cramping.  Today she had lab work done and noticed that when she was walking into the clinic she felt very fatigued, lightheaded and had a twinge of chest pain.  Today in the ER she was noted to have a hemoglobin of 9.5.  Rectal exam revealed heme positive melanotic stool.  Review of Systems: Please see HPI for pertinent positives and negatives. A complete 10 system review of systems are otherwise negative.  Past Medical History:  Diagnosis Date   ABLA (acute blood loss anemia) 04/16/2022   Congenital malformation of intestinal fixation    Gastritis    Gastroesophageal reflux disease without esophagitis 09/10/2021   IDA (iron  deficiency anemia)    Mitral valve prolapse    Persistent hyperplasia of thymus (HCC)    S/P Thymectomy   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 Wendel   Thrombocytosis    Past Surgical History:  Procedure Laterality Date   BIOPSY  06/26/2022   Procedure: BIOPSY;  Surgeon: Stacia Glendia BRAVO, MD;  Location: [facility];  Service: Gastroenterology;;   COLONOSCOPY N/A 04/17/2022   Procedure: COLONOSCOPY;  Surgeon: Albertus Gordy HERO, MD;  Location: [facility];  Service: Gastroenterology;  Laterality: N/A;   COLONOSCOPY WITH PROPOFOL  N/A 06/26/2022   Procedure: COLONOSCOPY WITH  PROPOFOL ;  Surgeon: Stacia Glendia BRAVO, MD;  Location: [facility];  Service: Gastroenterology;  Laterality: N/A;   ENTEROSCOPY N/A 04/17/2022   Procedure: ENTEROSCOPY;  Surgeon: Albertus Gordy HERO, MD;  Location: [facility];  Service: Gastroenterology;  Laterality: N/A;   ENTEROSCOPY N/A 06/26/2022   Procedure: ENTEROSCOPY;  Surgeon: Stacia Glendia BRAVO, MD;  Location: [facility];  Service: Gastroenterology;  Laterality: N/A;   GIVENS CAPSULE STUDY N/A 06/26/2022   Procedure: GIVENS CAPSULE STUDY;  Surgeon: Stacia Glendia BRAVO, MD;  Location: [facility];  Service: Gastroenterology;  Laterality: N/A;   HEMOSTASIS CLIP PLACEMENT  04/17/2022   Procedure: HEMOSTASIS CLIP PLACEMENT;  Surgeon: Albertus Gordy HERO, MD;  Location: [facility];  Service: Gastroenterology;;   HEMOSTASIS CLIP PLACEMENT  06/26/2022   Procedure: HEMOSTASIS CLIP PLACEMENT;  Surgeon: Stacia Glendia BRAVO, MD;  Location: [facility];  Service: Gastroenterology;;   HOT HEMOSTASIS N/A 04/17/2022   Procedure: HOT HEMOSTASIS (ARGON PLASMA COAGULATION/BICAP);  Surgeon: Albertus Gordy HERO, MD;  Location: [facility];  Service: Gastroenterology;  Laterality: N/A;   HOT HEMOSTASIS N/A 06/26/2022   Procedure: HOT HEMOSTASIS (ARGON PLASMA COAGULATION/BICAP);  Surgeon: Stacia Glendia BRAVO, MD;  Location: [facility];  Service: Gastroenterology;  Laterality: N/A;   POLYPECTOMY  04/17/2022   Procedure: POLYPECTOMY;  Surgeon: Albertus Gordy HERO, MD;  Location: [facility];  Service: Gastroenterology;;   POLYPECTOMY  06/26/2022   Procedure: POLYPECTOMY;  Surgeon: Stacia Glendia BRAVO, MD;  Location: [facility];  Service: Gastroenterology;;   RIGHT/LEFT HEART CATH AND CORONARY ANGIOGRAPHY N/A 11/01/2023   Procedure: RIGHT/LEFT HEART CATH AND CORONARY ANGIOGRAPHY;  Surgeon: Darron Deatrice LABOR, MD;  Location: [facility] CV LAB;  Service: Cardiovascular;  Laterality: N/A;   SMALL INTESTINE SURGERY  1994   SUBMUCOSAL TATTOO INJECTION  06/26/2022    Procedure: SUBMUCOSAL TATTOO INJECTION;  Surgeon: Stacia Glendia BRAVO, MD;  Location: [facility];  Service: Gastroenterology;;   THYMECTOMY N/A    TRANSCATHETER MITRAL EDGE TO EDGE REPAIR N/A 11/28/2023   Procedure: TRANSCATHETER MITRAL EDGE TO EDGE REPAIR;  Surgeon: Wendel Lurena POUR, MD;  Location: [facility] INVASIVE CV LAB;  Service: Cardiovascular;  Laterality: N/A;   TRANSESOPHAGEAL ECHOCARDIOGRAM (CATH LAB) N/A 10/24/2023   Procedure: TRANSESOPHAGEAL ECHOCARDIOGRAM;  Surgeon: Mona Vinie BROCKS, MD;  Location: [facility] INVASIVE CV LAB;  Service: Cardiovascular;  Laterality: N/A;   TRANSESOPHAGEAL ECHOCARDIOGRAM (CATH LAB) N/A 11/28/2023   Procedure: TRANSESOPHAGEAL ECHOCARDIOGRAM;  Surgeon: Wendel Lurena POUR, MD;  Location: [facility]AS[facility];  Service: Cardiovascular;  Laterality: N/A;   Social History:  reports that she quit smoking about 23 years ago. Her smoking use included cigarettes. She has never used smokeless tobacco. She reports that she does not currently use alcohol. She reports that she does not use drugs.  Allergies  Allergen Reactions   Aspirin Other (See Comments)    Due to intestinal issue   Sulfa Antibiotics Other (See Comments)    bleeding    Family History  Problem Relation Age of Onset   Heart disease Mother    Valvular heart disease Mother    Healthy Sister    Alcoholism Half-Sister      Prior to Admission medications   Medication Sig Start Date End Date Taking? Authorizing Provider  acetaminophen  (TYLENOL ) 500 MG tablet Take 1,000 mg by mouth every 6 (six) hours as needed for headache (pain.).   Yes [provider]  amoxicillin  (AMOXIL ) 500 MG tablet Take 4 tablets (2,000 mg total) by mouth as directed. 1 hour prior to dental work including cleanings Patient taking differently: Take 2,000 mg by mouth as needed (dental work). 1 hour prior to dental work including cleanings 12/03/23  Yes Sebastian Lamarr SAUNDERS, PA-C  cholestyramine  light (PREVALITE ) 4 g packet Take 2 g  by mouth 2 (two) times daily as needed.   Yes [provider]  clopidogrel  (PLAVIX ) 75 MG tablet Take 1 tablet (75 mg total) by mouth daily with breakfast. 11/30/23  Yes Sebastian Lamarr SAUNDERS, PA-C  famotidine  (PEPCID ) 40 MG tablet TAKE 1 TABLET(40 MG) BY MOUTH DAILY 02/19/24  Yes Dorsey, John T IV, MD  folic acid  (FOLVITE ) 1 MG tablet TAKE 1 TABLET(1 MG) BY MOUTH DAILY Patient taking differently: Take 1 mg by mouth at bedtime. 11/21/23  Yes Federico Norleen ONEIDA MADISON, MD  furosemide  (LASIX ) 40 MG tablet Take 1 tablet (40 mg total) by mouth as needed for fluid or edema. 12/03/23 04/22/24 Yes Sebastian Lamarr SAUNDERS, PA-C  Glucosamine-Chondroitin (COSAMIN DS PO) Take 1 tablet by mouth daily.   Yes [provider]  hydroxyurea  (HYDREA ) 500 MG capsule Take 2 capsules (1,000 mg total) by mouth 2 (two) times daily. May take with food to minimize GI side effects. Patient taking differently: Take 500 mg by mouth See admin instructions. Takes 2 tablets in the morning and 2 tablets at bedtime on Mondays, Wednesdays, and Fridays. Takes 2 tablet in the morning and 1 tablet at bedtime on Tuesdays, Thursdays, Sat, and Sun. 01/28/24  Yes Dorsey, John T IV, MD  Lycopene 10 MG CAPS Take 10 mg by mouth daily.   Yes [provider]  sucralfate  (  CARAFATE ) 1 g tablet TAKE 1 TABLET(1 GRAM) BY MOUTH FOUR TIMES DAILY AT BEDTIME WITH MEALS 08/31/23  Yes Dorsey, John T IV, MD  dicyclomine  (BENTYL ) 20 MG tablet Take 1 tablet (20 mg total) by mouth 2 (two) times daily. Patient not taking: Reported on 04/22/2024 02/07/24   Theotis Cameron HERO, PA-C    Physical Exam: BP (!) 107/91 (BP Location: Right Arm)   Pulse 77   Temp 97.9 F (36.6 C) (Oral)   Resp 16   Ht 5' 7 (1.702 m)   Wt 64.9 kg   LMP  (LMP Unknown)   SpO2 98%   BMI 22.40 kg/m  General:  Alert, oriented, calm, in no acute distress, looks very comfortable, pleasant and cooperative Cardiovascular: RRR, no murmurs or rubs, no peripheral edema  Respiratory:  clear to auscultation bilaterally, no wheezes, no crackles  Abdomen: soft, nontender, nondistended, normal bowel tones heard  Skin: dry, no rashes  Musculoskeletal: no joint effusions, normal range of motion  Psychiatric: appropriate affect, normal speech  Neurologic: extraocular muscles intact, clear speech, moving all extremities with intact sensorium         Labs on Admission:  Basic Metabolic Panel: Recent Labs  Lab 04/22/24 1400  NA 139  K 3.9  CL 106  CO2 28  GLUCOSE 96  BUN 14  CREATININE 0.67  CALCIUM  9.5   Liver Function Tests: Recent Labs  Lab 04/22/24 1400  AST 22  ALT 14  ALKPHOS 50  BILITOT 0.6  PROT 6.3*  ALBUMIN  4.0   No results for input(s): LIPASE, AMYLASE in the last 168 hours. No results for input(s): AMMONIA in the last 168 hours. CBC: Recent Labs  Lab 04/22/24 1400  WBC 4.9  NEUTROABS 3.8  HGB 9.5*  HCT 27.8*  MCV 129.3*  PLT 177   Cardiac Enzymes: No results for input(s): CKTOTAL, CKMB, CKMBINDEX, TROPONINI in the last 168 hours. BNP (last 3 results) Recent Labs    10/06/23 0853 10/30/23 1630  BNP 195.5* 99.4    ProBNP (last 3 results) Recent Labs    11/26/23 1148  PROBNP 474*    CBG: No results for input(s): GLUCAP in the last 168 hours.  Radiological Exams on Admission: DG Chest Portable 1 View Result Date: 04/22/2024 CLINICAL DATA:  Chest pain. Decreased blood counts over the last week. EXAM: PORTABLE CHEST 1 VIEW COMPARISON:  02/07/2024 FINDINGS: Postoperative changes in the mediastinum. Calcification in the mitral valve annulus. Heart size and pulmonary vascularity are normal for technique. Lungs are clear. No pleural effusion or pneumothorax. Mediastinal contours appear intact. IMPRESSION: No active disease. Electronically Signed   By: Elsie Gravely M.D.   On: 04/22/2024 18:10   Assessment/Plan Kristina Osborne is a 73 y.o. female with medical history significant for GERD, iron  deficiency anemia,  essential thrombocytosis, multiple GI bleeds including due to AVM currently on monotherapy with Plavix  being admitted to the hospital with melena and symptomatic anemia.   Lower GI bleed-with melena, symptomatic anemia, abdominal cramping.  Patient has a history of multiple GI bleeds including due to AVM and bleeding in the distant past due to small bowel anastomosis, so given her history suspect lower GI bleed but could also be an upper GI bleed.  Currently she is anticoagulated with Plavix  due to history of MitraClip placed April 2025. -Inpatient admission -Monitor on progressive -Avoid blood thinners, hold Plavix  -Trend hemoglobin -IV PPI daily -Clear liquid diet, n.p.o. after midnight -[facility] consult  Blood loss anemia-baseline hemoglobin appears to  be around 12, has dropped to 9.5 in the course of about 12 days.  Currently no indication for blood transfusion.  Treat suspected bleeding as above.  May benefit from iron  infusion.  Chronic anticoagulation-anticoagulated with Plavix  after having MitraClip April 2025.  Would recommend reviewing need for continued anticoagulation given her longstanding history of GI bleeding and anemia.  Essential thrombocytosis-hold hydroxyurea   DVT prophylaxis: SCDs only    Code Status: Full Code  Consults called: Secure chat sent to [facility] Dr. Abran, Dr. Federico added to inpatient treatment team.  Admission status: The appropriate patient status for this patient is INPATIENT. Inpatient status is judged to be reasonable and necessary in order to provide the required intensity of service to ensure the patient's safety. The patient's presenting symptoms, physical exam findings, and initial radiographic and laboratory data in the context of their chronic comorbidities is felt to place them at high risk for further clinical deterioration. Furthermore, it is not anticipated that the patient will be medically stable for discharge from the hospital within 2  midnights of admission.    I certify that at the point of admission it is my clinical judgment that the patient will require inpatient hospital care spanning beyond 2 midnights from the point of admission due to high intensity of service, high risk for further deterioration and high frequency of surveillance required  Time spent: 59 minutes  Finbar Nippert CHRISTELLA Gail MD [facility] Pager [phone]  If 7PM-7AM, please contact night-coverage [email or web address] Password [facility]  04/22/2024, 6:37 PM

## 2024-04-22 NOTE — Telephone Encounter (Signed)
 Pt called back this morning and wants to come in for lab work to check her hgb.  She has been scheduled today at 12:00.  She wants to wait for the lab results in case she needs to go to the ED for a possible GI bleed

## 2024-04-22 NOTE — Telephone Encounter (Signed)
 Pt went to the ED to be evaluated for a GI bleed after having a drop in lab results

## 2024-04-23 ENCOUNTER — Encounter (HOSPITAL_COMMUNITY): Payer: Self-pay | Admitting: Internal Medicine

## 2024-04-23 ENCOUNTER — Inpatient Hospital Stay (HOSPITAL_COMMUNITY): Admitting: Anesthesiology

## 2024-04-23 ENCOUNTER — Encounter (HOSPITAL_COMMUNITY)
Admission: EM | Disposition: A | Discharge status: Home / Self Care | Payer: Self-pay | Source: Ambulatory Visit | Attending: Internal Medicine

## 2024-04-23 DIAGNOSIS — Z7902 Long term (current) use of antithrombotics/antiplatelets: Secondary | ICD-10-CM

## 2024-04-23 DIAGNOSIS — D1339 Benign neoplasm of other parts of small intestine: Secondary | ICD-10-CM

## 2024-04-23 DIAGNOSIS — K922 Gastrointestinal hemorrhage, unspecified: Secondary | ICD-10-CM | POA: Diagnosis not present

## 2024-04-23 DIAGNOSIS — I5030 Unspecified diastolic (congestive) heart failure: Secondary | ICD-10-CM

## 2024-04-23 DIAGNOSIS — L818 Other specified disorders of pigmentation: Secondary | ICD-10-CM

## 2024-04-23 DIAGNOSIS — K2289 Other specified disease of esophagus: Secondary | ICD-10-CM

## 2024-04-23 DIAGNOSIS — D62 Acute posthemorrhagic anemia: Secondary | ICD-10-CM

## 2024-04-23 DIAGNOSIS — K921 Melena: Secondary | ICD-10-CM

## 2024-04-23 HISTORY — PX: ENTEROSCOPY: SHX5533

## 2024-04-23 LAB — CBC
HCT: 28.2 % — ABNORMAL LOW (ref 36.0–46.0)
Hemoglobin: 8.9 g/dL — ABNORMAL LOW (ref 12.0–15.0)
MCH: 42.8 pg — ABNORMAL HIGH (ref 26.0–34.0)
MCHC: 31.6 g/dL (ref 30.0–36.0)
MCV: 135.6 fL — ABNORMAL HIGH (ref 80.0–100.0)
Platelets: 138 K/uL — ABNORMAL LOW (ref 150–400)
RBC: 2.08 MIL/uL — ABNORMAL LOW (ref 3.87–5.11)
RDW: 13.5 % (ref 11.5–15.5)
WBC: 3 K/uL — ABNORMAL LOW (ref 4.0–10.5)
nRBC: 0 % (ref 0.0–0.2)

## 2024-04-23 LAB — CBC WITH DIFFERENTIAL/PLATELET
Abs Immature Granulocytes: 0.01 K/uL (ref 0.00–0.07)
Basophils Absolute: 0 K/uL (ref 0.0–0.1)
Basophils Relative: 0 %
Eosinophils Absolute: 0 K/uL (ref 0.0–0.5)
Eosinophils Relative: 0 %
HCT: 27.1 % — ABNORMAL LOW (ref 36.0–46.0)
Hemoglobin: 8.6 g/dL — ABNORMAL LOW (ref 12.0–15.0)
Immature Granulocytes: 0 %
Lymphocytes Relative: 16 %
Lymphs Abs: 0.5 K/uL — ABNORMAL LOW (ref 0.7–4.0)
MCH: 43 pg — ABNORMAL HIGH (ref 26.0–34.0)
MCHC: 31.7 g/dL (ref 30.0–36.0)
MCV: 135.5 fL — ABNORMAL HIGH (ref 80.0–100.0)
Monocytes Absolute: 0.3 K/uL (ref 0.1–1.0)
Monocytes Relative: 9 %
Neutro Abs: 2.4 K/uL (ref 1.7–7.7)
Neutrophils Relative %: 75 %
Platelets: 143 K/uL — ABNORMAL LOW (ref 150–400)
RBC: 2 MIL/uL — ABNORMAL LOW (ref 3.87–5.11)
RDW: 13.6 % (ref 11.5–15.5)
Smear Review: NORMAL
WBC: 3.2 K/uL — ABNORMAL LOW (ref 4.0–10.5)
nRBC: 0 % (ref 0.0–0.2)

## 2024-04-23 LAB — BASIC METABOLIC PANEL WITH GFR
Anion gap: 12 (ref 5–15)
BUN: 12 mg/dL (ref 8–23)
CO2: 23 mmol/L (ref 22–32)
Calcium: 9.6 mg/dL (ref 8.9–10.3)
Chloride: 106 mmol/L (ref 98–111)
Creatinine, Ser: 0.68 mg/dL (ref 0.44–1.00)
GFR, Estimated: >60 mL/min (ref >60)
Glucose, Bld: 95 mg/dL (ref 70–99)
Potassium: 3.9 mmol/L (ref 3.5–5.1)
Sodium: 141 mmol/L (ref 135–145)

## 2024-04-23 SURGERY — ENTEROSCOPY
Anesthesia: Monitor Anesthesia Care

## 2024-04-23 MED ORDER — PROPOFOL 10 MG/ML IV BOLUS
INTRAVENOUS | Status: DC | PRN
Start: 2024-04-23 — End: 2024-04-23
  Administered 2024-04-23: 115 ug/kg/min via INTRAVENOUS
  Administered 2024-04-23 (×2): 20 mg via INTRAVENOUS

## 2024-04-23 MED ORDER — SODIUM CHLORIDE 0.9 % IV SOLN: INTRAVENOUS | Status: AC

## 2024-04-23 MED ORDER — PROPOFOL 10 MG/ML IV BOLUS
INTRAVENOUS | Status: AC
  Filled 2024-04-23: qty 20

## 2024-04-23 MED ORDER — LACTATED RINGERS IV SOLN: INTRAVENOUS | Status: DC | PRN

## 2024-04-23 MED ORDER — PHENYLEPHRINE 80 MCG/ML (10ML) SYRINGE FOR IV PUSH (FOR BLOOD PRESSURE SUPPORT)
PREFILLED_SYRINGE | INTRAVENOUS | Status: DC | PRN
  Administered 2024-04-23 (×2): 80 ug via INTRAVENOUS

## 2024-04-23 MED ORDER — NA SULFATE-K SULFATE-MG SULF 17.5-3.13-1.6 GM/177ML PO SOLN
0.5000 | Freq: Once | ORAL | Status: AC
  Administered 2024-04-23: 177 mL via ORAL
  Filled 2024-04-23: qty 1

## 2024-04-23 MED ORDER — ALBUMIN HUMAN 5 % IV SOLN: INTRAVENOUS | Status: DC | PRN

## 2024-04-23 MED ORDER — FENTANYL CITRATE (PF) 100 MCG/2ML IJ SOLN
INTRAMUSCULAR | Status: AC
  Filled 2024-04-23: qty 2

## 2024-04-23 MED ORDER — SODIUM CHLORIDE 0.9 % IV SOLN
INTRAVENOUS | Status: AC | PRN
  Administered 2024-04-23: 500 mL via INTRAMUSCULAR

## 2024-04-23 MED ORDER — PROPOFOL 1000 MG/100ML IV EMUL
INTRAVENOUS | Status: AC
Start: 2024-04-23 — End: 2024-04-23
  Filled 2024-04-23: qty 100

## 2024-04-23 MED ORDER — FENTANYL CITRATE (PF) 100 MCG/2ML IJ SOLN
INTRAMUSCULAR | Status: DC | PRN
  Administered 2024-04-23: 50 ug via INTRAVENOUS

## 2024-04-23 MED ORDER — ALBUMIN HUMAN 5 % IV SOLN
INTRAVENOUS | Status: AC
  Filled 2024-04-23: qty 250

## 2024-04-23 MED ORDER — LIDOCAINE HCL (PF) 1 % IJ SOLN
INTRAMUSCULAR | Status: DC | PRN
Start: 2024-04-23 — End: 2024-04-23
  Administered 2024-04-23: 100 mg

## 2024-04-23 NOTE — Anesthesia Preprocedure Evaluation (Signed)
 Anesthesia Evaluation  Patient identified by MRN, date of birth, ID band Patient awake    Reviewed: Allergy & Precautions, NPO status , Patient's Chart, lab work & pertinent test results  Airway Mallampati: I  TM Distance: >3 FB Neck ROM: Full    Dental  (+) Teeth Intact, Dental Advisory Given   Pulmonary former smoker   breath sounds clear to auscultation       Cardiovascular negative cardio ROS + Valvular Problems/Murmurs MVP  Rhythm:Regular Rate:Normal  Echo:   1. Left ventricular ejection fraction, by estimation, is 60 to 65%. Left  ventricular ejection fraction by 3D volume is 60 %. The left ventricle has  normal function. The left ventricle has no regional wall motion  abnormalities. Left ventricular diastolic   parameters are consistent with Grade I diastolic dysfunction (impaired  relaxation). The average left ventricular global longitudinal strain is  -24.7 %. The global longitudinal strain is normal.   2. Right ventricular systolic function is normal. The right ventricular  size is normal. There is normal pulmonary artery systolic pressure. The  estimated right ventricular systolic pressure is 25.5 mmHg.   3. Left atrial size was moderately dilated.   4. No definite ASD visualized on this study.   5. Status post mTEER. 2 Mitraclips in the A2/P2 position. Mildly elevated  mean gradient 4 mmHg. There is mild-moderate residual mitral  regurgitation.   6. The aortic valve is tricuspid. Aortic valve regurgitation is trivial.  No aortic stenosis is present.   7. The inferior vena cava is normal in size with greater than 50%  respiratory variability, suggesting right atrial pressure of 3 mmHg.     Neuro/Psych  Headaches  Neuromuscular disease  negative psych ROS   GI/Hepatic Neg liver ROS,GERD  Medicated,,  Endo/Other  negative endocrine ROS    Renal/GU negative Renal ROS     Musculoskeletal negative  musculoskeletal ROS (+)    Abdominal   Peds  Hematology  (+) Blood dyscrasia, anemia   Anesthesia Other Findings   Reproductive/Obstetrics                              Anesthesia Physical Anesthesia Plan  ASA: 2  Anesthesia Plan: MAC   Post-op Pain Management: Minimal or no pain anticipated   Induction: Intravenous  PONV Risk Score and Plan: 0 and Propofol  infusion  Airway Management Planned: Natural Airway and Nasal Cannula  Additional Equipment: None  Intra-op Plan:   Post-operative Plan:   Informed Consent: I have reviewed the patients History and Physical, chart, labs and discussed the procedure including the risks, benefits and alternatives for the proposed anesthesia with the patient or authorized representative who has indicated his/her understanding and acceptance.       Plan Discussed with: CRNA  Anesthesia Plan Comments:         Anesthesia Quick Evaluation

## 2024-04-23 NOTE — TOC Initial Note (Signed)
 Transition of Care [facility]) - Initial/Assessment Note    Patient Details  Name: Kristina Osborne MRN: [id number] Date of Birth: 04-28-1951  [facility] CM/SW Contact:    Bascom Service, RN Phone Number: 04/23/2024, 12:57 PM  Clinical Narrative: d/c plan home.                  Expected Discharge Plan: Home/Self Care Barriers to Discharge: Continued Medical Work up   Patient Goals and CMS Choice Patient states their goals for this hospitalization and ongoing recovery are:: Home CMS Medicare.gov Compare Post Acute Care list provided to:: Patient Choice offered to / list presented to : Patient [place] ownership interest in [facility].provided to:: Patient    Expected Discharge Plan and Services                                              Prior Living Arrangements/Services                       Activities of Daily Living   ADL Screening (condition at time of admission) Independently performs ADLs?: Yes (appropriate for developmental age) Is the patient deaf or have difficulty hearing?: No Does the patient have difficulty seeing, even when wearing glasses/contacts?: No Does the patient have difficulty concentrating, remembering, or making decisions?: No  Permission Sought/Granted                  Emotional Assessment              Admission diagnosis:  Melena [K92.1] Lower GI bleed [K92.2] Symptomatic anemia [D64.9] Patient Active Problem List   Diagnosis Date Noted   Lower GI bleed 04/22/2024   S/P mitral valve clip implantation 11/28/2023   Pulmonary hypertension, unspecified (HCC) 11/02/2023   (HFpEF) heart failure with preserved ejection fraction (HCC) 11/02/2023   Hypokalemia 10/18/2023   Severe mitral regurgitation 10/18/2023   Posterior neck pain 09/08/2023   LLQ pain 09/08/2023   Acute non-recurrent maxillary sinusitis 08/09/2023   Sinus congestion 08/09/2023   Sinus headache 08/09/2023   Macrocytic  anemia 06/25/2022   GI bleed 06/24/2022   Iron  deficiency anemia    Heme positive stool    Jejunal polyp    Benign neoplasm of colon    Symptomatic anemia 04/16/2022   GI bleeding 04/15/2022   Right elbow pain 11/06/2021   Palpitations 11/06/2021   Cervical radiculitis 11/02/2021   AVM (arteriovenous malformation) of small bowel, 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   MVP (mitral valve prolapse) 09/10/2021   Gastroesophageal reflux disease without esophagitis 09/10/2021   Thrombocytosis 11/12/2019   PCP:  Norleen Lynwood ORN, MD Pharmacy:   [facility] [phone] [address] [place], [place] - [address] AT [facility] [address] & [facility] [address] [place] [address] Phone: [phone] Fax: [phone]     Social Drivers of Health (SDOH) Social History: SDOH Screenings   Food Insecurity: No Food Insecurity (04/22/2024)  Housing: Low Risk  (04/22/2024)  Transportation Needs: No Transportation Needs (04/22/2024)  Utilities: Not At Risk (04/22/2024)  Depression (PHQ2-9): Low Risk  (04/08/2024)  Social Connections: Socially Integrated (04/22/2024)  Tobacco Use: Medium Risk (04/22/2024)   SDOH Interventions:     Readmission Risk Interventions     No data to display

## 2024-04-23 NOTE — Interval H&P Note (Signed)
 History and Physical Interval Note:  04/23/2024 3:06 PM  Kristina Osborne  has presented today for surgery, with the diagnosis of GI bleed, melena, small bowel AVMs.  The various methods of treatment have been discussed with the patient and family. After consideration of risks, benefits and other options for treatment, the patient has consented to  Procedure(s): ENTEROSCOPY (N/A) as a surgical intervention.  The patient's history has been reviewed, patient examined, no change in status, stable for surgery.  I have reviewed the patient's chart and labs.  Questions were answered to the patient's satisfaction.    This patient's blood pressure remains 85/60 with a pulse in the 70s.  She is alert conversational and not having chest pain dyspnea or visual disturbance.  Anesthesia service is aware and has bolused the patient IV fluids.   Victory LITTIE Brand III

## 2024-04-23 NOTE — Consult Note (Addendum)
 Referring Provider: Tinnie Matter PA-C Primary Care Physician:  Norleen Lynwood ORN, MD Primary Gastroenterologist:  [facility], previously Dr. Aneita  Reason for Consultation:  GI bleed, melena, anemia   HPI: Kristina Osborne is a 73 y.o. 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 Federico for follow up on 04/10/2024. At that time, she endorsed feeling well without any bloody or black stools. On Plavix , no ASA. She noted having intermittent mild stomach burning with occasional loose stools. Hydroxyurea  dose was increased to 1000mg  bid and potential future bone marrow biopsy was considered to assure that essential thrombocytopenia is the diagnosis. Last received IV iron  09/2023. Lab 8/14 which showed a Hg level of 12.4. HCT 35.5. PLT count 418. Ferritin 37. She started passing black stools on Sunday 8/24 concerning for recurrent GI bleeding. Repeat labs at the cancer center 04/22/2024 showed her Hg level dropped to 9.5 and she was sent to the ED for further evaluation. GI consult was requested for further evaluation regarding GI bleed and acute on chronic anemia.   Labs 04/22/2024 at the cancer center: WBC 4 point.  Hemoglobin 9.5.  Hematocrit 27.8.  Platelets 177.  Ferritin 37.  BUN 14.  Creatinine 0.67.  Bili 0.6.  Alk phos was 50.  AST 22.  ALT 14.  Labs in the ED: Hemoglobin of 9.6.  Hematocrit 29.5.  FOBT  positive.  Troponin < 15.  Labs today: WBC 3.0.  Hemoglobin 8.9.  Hematocrit 28.2.  Platelets 138.  Sodium 140. Potassium 3.9.  BUN 12.  Creatinine 0.68.   She typically passes a brown stool most days, stools are loose since her colon and small bowel surgeries. She occasionally passes a solid stool. She passed 4 loose black stools on Sunday 8/24, x 3 episodes on 8/25 and yesterday passed a loose black stool mixed with solid stool x 1 at home then 1 episode in the ED and x 1 episode later yesterday evening. Last loose black stool was 4 AM today. No bright red blood per the rectum. She has occasional stomach burning which is chronic. No heartburn or dysphagia. She takes Famotidine  and Sucralfate  nightly. No nausea or vomiting. She has intermittent lower abdominal cramping when passing loose stools. No Pepto-Bismol use. She does not take oral iron . Since admission, she feels slightly lightheaded and a little short winded when ambulating. No chest pain, last dose of Plavix  was 8:45 AM on 8/26.  No ASA or NSAIDs. Non-smoker. No alcohol use.  She has a significant history of recurrent GI bleeding secondary to small bowel and colon AVMs. Her most recent hospitalization for GI bleeding was 10/28 - 06/27/2022. During that hospitalization, she underwent a SBE which showed LA Grade A esophagitis, a few duodenal AVMs treated with APC, colonoscopy showed multiple large nonbleeding right colon angioectasias and 2 small hyperplastic colon polyps and VCE showing a tattoo at the distal site of SBE and one small angioectasia at 58 minutes just beyond the tattoo. Admission  Hg 10.5, she did not require blood transfusions.  She received IV iron  x 1.  Hemoglobin 9.4 at discharge.  She wishes to go home as soon as possible as she takes care of her husband who had a past CVA.   ECHO 01/10/2024:  Left ventricular ejection fraction, by estimation, is 60 to 65%. Left ventricular ejection fraction by 3D volume is 60 %. The left  ventricle has normal function. The left ventricle has no regional wall motion abnormalities. Left ventricular diastolic parameters are consistent with Grade I diastolic dysfunction (impaired relaxation). The average left ventricular global longitudinal strain is -24.7 %. The global longitudinal strain is normal. 1. Right ventricular systolic function is normal. The right ventricular size is normal. There is normal pulmonary artery systolic pressure. The estimated right ventricular systolic pressure is 25.5 mmHg. 2. 3. Left atrial size was moderately dilated. 4. No definite ASD visualized on this study. Status post mTEER. 2 Mitraclips in the A2/P2 position. Mildly elevated mean gradient 4 mmHg. There is mild-moderate residual mitral regurgitation. 5. The aortic valve is tricuspid. Aortic valve regurgitation is trivial. No aortic stenosis is present. 6. The inferior vena cava is normal in size with greater than 50% respiratory variability, suggesting right atrial pressure of 3 mmHg.   MOST RECENT GI PROCEDURES:   Small bowel enteroscopy 06/26/2022:  - LA Grade A reflux esophagitis with no bleeding.  - Normal stomach.  - Normal examined duodenum.  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 Treated with argon plasma coagulation (APC). These small lesions are unlikely to explain the patient's overt melena/hematochezia and drop in hemoglobin  - Areas from the jejunum at 140 cm from the incisors were tattooed.  - No specimens collected.   Colonoscopy 06/26/2022:  - Multiple non-bleeding colonic angioectasias. These lesions were multiple and large, involving a large surface of the right colon near the ileocolonic anastomosis. There was not active bleeding and it is not known which, if any of these vascular lesions was the culprit for the bleed. Definitive treatment with APC would involve extensive ablation of the colonic mucosa, though to be high risk for complications such as perforation.   - One 5 mm polyp in the descending colon, removed with a cold snare. Resected and retrieved.  - One 4 mm polyp at the recto-sigmoid colon, removed with a cold snare. Resected and retrieved. Clip (MR conditional) was placed. Clip manufacturer: [employer].   - Decreased mucosa vascular pattern in the descending colon and in the transverse colon. Biopsied.  - The examined portion of the ileum was normal.  - The distal rectum and anal verge are normal on retroflexion view.  - No further colon polyp surveillance colonoscopies recommended   A. COLON, TRANSVERSE, BIOPSY:  Mild nonspecific inflammation consistent with prep related changes in  fragments of colonic mucosa.  There are no diagnostic features of inflammatory bowel disease,  microscopic colitis and collagenous colitis.   B. COLON, DESCENDING, POLYPECTOMY:  Hyperplastic polyp.  Negative for dysplasia.   C. COLON, RECTOSIGMOID, POLYPECTOMY:  Hyperplastic polyp.  Negative for dysplasia.   Small bowel capsule endoscopy 06/26/2022:     Small bowel endoscopy 04/17/2022:  - Normal stomach.  - Erythematous duodenopathy in bulb without bleeding.  - Two recently bleeding angioectasias in the jejunum. Treated with argon plasma coagulation (APC).  - Jejunal polyp. Resected and retrieved. Clip (MR conditional) was placed   Colonoscopy 04/17/2022:  - Patent end-to-side ileo-colonic anastomosis, characterized by healthy appearing mucosa.  - The  examined portion of the ileum was normal.  - Patches of telangiectasias (telangiectatic vessels) without bleeding scattered in the descending colon, in the transverse colon and in the ascending colon.  - One 6 mm polyp in the proximal sigmoid colon, removed with a hot snare. Resected and retrieved.  - Internal hemorrhoids.  A. JEJUNUM, PROXIMAL, POLYPECTOMY:  - Hyperplastic small bowel polyp.  - Negative for dysplasia or malignancy.   B. COLON, SIGMOID, POLYPECTOMY:  - Inflammatory polyp.   - Negative for dysplasia or malignancy.   Past Medical History:  Diagnosis Date   ABLA (acute blood loss anemia) 04/16/2022   Congenital malformation of intestinal fixation    Gastritis    Gastroesophageal reflux disease without esophagitis 09/10/2021   IDA (iron  deficiency anemia)    Mitral valve prolapse    Persistent hyperplasia of thymus (HCC)    S/P Thymectomy   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 Wendel   Thrombocytosis     Past Surgical History:  Procedure Laterality Date   BIOPSY  06/26/2022   Procedure: BIOPSY;  Surgeon: Stacia Glendia BRAVO, MD;  Location: [facility];  Service: Gastroenterology;;   COLONOSCOPY N/A 04/17/2022   Procedure: COLONOSCOPY;  Surgeon: Albertus Gordy HERO, MD;  Location: [facility];  Service: Gastroenterology;  Laterality: N/A;   COLONOSCOPY WITH PROPOFOL  N/A 06/26/2022   Procedure: COLONOSCOPY WITH PROPOFOL ;  Surgeon: Stacia Glendia BRAVO, MD;  Location: [facility];  Service: Gastroenterology;  Laterality: N/A;   ENTEROSCOPY N/A 04/17/2022   Procedure: ENTEROSCOPY;  Surgeon: Albertus Gordy HERO, MD;  Location: [facility];  Service: Gastroenterology;  Laterality: N/A;   ENTEROSCOPY N/A 06/26/2022   Procedure: ENTEROSCOPY;  Surgeon: Stacia Glendia BRAVO, MD;  Location: [facility];  Service: Gastroenterology;  Laterality: N/A;   GIVENS CAPSULE STUDY N/A 06/26/2022   Procedure: GIVENS CAPSULE STUDY;  Surgeon: Stacia Glendia BRAVO, MD;  Location: [facility];  Service: Gastroenterology;  Laterality: N/A;   HEMOSTASIS CLIP PLACEMENT  04/17/2022   Procedure: HEMOSTASIS CLIP PLACEMENT;  Surgeon: Albertus Gordy HERO, MD;  Location: [facility];  Service: Gastroenterology;;   HEMOSTASIS CLIP PLACEMENT  06/26/2022   Procedure: HEMOSTASIS CLIP PLACEMENT;  Surgeon: Stacia Glendia BRAVO, MD;  Location: [facility];  Service: Gastroenterology;;   HOT HEMOSTASIS N/A 04/17/2022    Procedure: HOT HEMOSTASIS (ARGON PLASMA COAGULATION/BICAP);  Surgeon: Albertus Gordy HERO, MD;  Location: [facility];  Service: Gastroenterology;  Laterality: N/A;   HOT HEMOSTASIS N/A 06/26/2022   Procedure: HOT HEMOSTASIS (ARGON PLASMA COAGULATION/BICAP);  Surgeon: Stacia Glendia BRAVO, MD;  Location: [facility];  Service: Gastroenterology;  Laterality: N/A;   POLYPECTOMY  04/17/2022   Procedure: POLYPECTOMY;  Surgeon: Albertus Gordy HERO, MD;  Location: [facility];  Service: Gastroenterology;;   POLYPECTOMY  06/26/2022   Procedure: POLYPECTOMY;  Surgeon: Stacia Glendia BRAVO, MD;  Location: [facility];  Service: Gastroenterology;;   RIGHT/LEFT HEART CATH AND CORONARY ANGIOGRAPHY N/A 11/01/2023   Procedure: RIGHT/LEFT HEART CATH AND CORONARY ANGIOGRAPHY;  Surgeon: Darron Deatrice LABOR, MD;  Location: [facility];  Service: Cardiovascular;  Laterality: N/A;   SMALL INTESTINE SURGERY  1994   SUBMUCOSAL TATTOO INJECTION  06/26/2022   Procedure: SUBMUCOSAL TATTOO INJECTION;  Surgeon: Stacia Glendia BRAVO, MD;  Location: [facility];  Service: Gastroenterology;;   THYMECTOMY N/A    TRANSCATHETER MITRAL EDGE TO EDGE REPAIR N/A 11/28/2023   Procedure: TRANSCATHETER MITRAL EDGE TO EDGE REPAIR;  Surgeon: Thukkani, Arun K,  MD;  Location: [facility];  Service: Cardiovascular;  Laterality: N/A;   TRANSESOPHAGEAL ECHOCARDIOGRAM (CATH LAB) N/A 10/24/2023   Procedure: TRANSESOPHAGEAL ECHOCARDIOGRAM;  Surgeon: Mona Vinie BROCKS, MD;  Location: [facility];  Service: Cardiovascular;  Laterality: N/A;   TRANSESOPHAGEAL ECHOCARDIOGRAM (CATH LAB) N/A 11/28/2023   Procedure: TRANSESOPHAGEAL ECHOCARDIOGRAM;  Surgeon: Wendel Lurena POUR, MD;  Location: [facility];  Service: Cardiovascular;  Laterality: N/A;    Prior to Admission medications   Medication Sig Start Date End Date Taking? Authorizing Provider  acetaminophen  (TYLENOL ) 500 MG tablet Take 1,000 mg by mouth every 6 (six) hours as needed for  headache (pain.).   Yes [provider]  amoxicillin  (AMOXIL ) 500 MG tablet Take 4 tablets (2,000 mg total) by mouth as directed. 1 hour prior to dental work including cleanings Patient taking differently: Take 2,000 mg by mouth as needed (dental work). 1 hour prior to dental work including cleanings 12/03/23  Yes Sebastian Lamarr SAUNDERS, PA-C  cholestyramine  light (PREVALITE ) 4 g packet Take 2 g by mouth 2 (two) times daily as needed.   Yes [provider]  clopidogrel  (PLAVIX ) 75 MG tablet Take 1 tablet (75 mg total) by mouth daily with breakfast. 11/30/23  Yes Sebastian Lamarr SAUNDERS, PA-C  famotidine  (PEPCID ) 40 MG tablet TAKE 1 TABLET(40 MG) BY MOUTH DAILY 02/19/24  Yes Dorsey, John T IV, MD  folic acid  (FOLVITE ) 1 MG tablet TAKE 1 TABLET(1 MG) BY MOUTH DAILY Patient taking differently: Take 1 mg by mouth at bedtime. 11/21/23  Yes Federico Norleen ONEIDA MADISON, MD  furosemide  (LASIX ) 40 MG tablet Take 1 tablet (40 mg total) by mouth as needed for fluid or edema. 12/03/23 04/22/24 Yes Sebastian Lamarr SAUNDERS, PA-C  Glucosamine-Chondroitin (COSAMIN DS PO) Take 1 tablet by mouth daily.   Yes [provider]  hydroxyurea  (HYDREA ) 500 MG capsule Take 2 capsules (1,000 mg total) by mouth 2 (two) times daily. May take with food to minimize GI side effects. Patient taking differently: Take 500 mg by mouth See admin instructions. Takes 2 tablets in the morning and 2 tablets at bedtime on Mondays, Wednesdays, and Fridays. Takes 2 tablet in the morning and 1 tablet at bedtime on Tuesdays, Thursdays, Sat, and Sun. 01/28/24  Yes Dorsey, John T IV, MD  Lycopene 10 MG CAPS Take 10 mg by mouth daily.   Yes [provider]  sucralfate  (CARAFATE ) 1 g tablet TAKE 1 TABLET(1 GRAM) BY MOUTH FOUR TIMES DAILY AT BEDTIME WITH MEALS 08/31/23  Yes Dorsey, John T IV, MD  dicyclomine  (BENTYL ) 20 MG tablet Take 1 tablet (20 mg total) by mouth 2 (two) times daily. Patient not taking: Reported on 04/22/2024 02/07/24   Theotis Cameron HERO, PA-C    Current Facility-Administered Medications  Medication Dose Route Frequency Provider Last Rate Last Admin   acetaminophen  (TYLENOL ) tablet 650 mg  650 mg Oral Q6H PRN Zella, Mir M, MD       Or   acetaminophen  (TYLENOL ) suppository 650 mg  650 mg Rectal Q6H PRN Zella, Mir M, MD       albuterol  (PROVENTIL ) (2.5 MG/3ML) 0.083% nebulizer solution 2.5 mg  2.5 mg Nebulization Q2H PRN Zella, Mir M, MD       ondansetron  (ZOFRAN ) tablet 4 mg  4 mg Oral Q6H PRN Zella, Mir M, MD       Or   ondansetron  (ZOFRAN ) injection 4 mg  4 mg Intravenous Q6H PRN Zella Katha HERO, MD  pantoprazole  (PROTONIX ) injection 40 mg  40 mg Intravenous Q24H Zella, Mir M, MD   40 mg at 04/22/24 1907   traZODone  (DESYREL ) tablet 25 mg  25 mg Oral QHS PRN Zella Katha HERO, MD   25 mg at 04/23/24 0000    Allergies as of 04/22/2024 - Review Complete 04/22/2024  Allergen Reaction Noted   Aspirin Other (See Comments) 09/01/2021   Sulfa antibiotics Other (See Comments) 04/08/2021    Family History  Problem Relation Age of Onset   Heart disease Mother    Valvular heart disease Mother    Healthy Sister    Alcoholism Half-Sister     Social History   Socioeconomic History   Marital status: Married    Spouse name: Not on file   Number of children: 0   Years of education: Not on file   Highest education level: Not on file  Occupational History   Not on file  Tobacco Use   Smoking status: Former    Current packs/day: 0.00    Types: Cigarettes    Quit date: 2002    Years since quitting: 23.6   Smokeless tobacco: Never   Tobacco comments:    Started smoking in her early 20's.    Smoked 0.5PP at her heaviest.   Vaping Use   Vaping status: Never Used  Substance and Sexual Activity   Alcohol use: Not Currently   Drug use: Never   Sexual activity: Yes    Partners: Male  Other Topics Concern   Not on file  Social History Narrative   Not on file   Social Drivers  of Health   Financial Resource Strain: Not on file  Food Insecurity: No Food Insecurity (04/22/2024)   Hunger Vital Sign    Worried About Running Out of Food in the Last Year: Never true    Ran Out of Food in the Last Year: Never true  Transportation Needs: No Transportation Needs (04/22/2024)   PRAPARE - Administrator, Civil Service (Medical): No    Lack of Transportation (Non-Medical): No  Physical Activity: Not on file  Stress: Not on file  Social Connections: Socially Integrated (04/22/2024)   Social Connection and Isolation Panel    Frequency of Communication with Friends and Family: More than three times a week    Frequency of Social Gatherings with Friends and Family: Once a week    Attends Religious Services: More than 4 times per year    Active Member of [employer] or Organizations: Yes    Attends Engineer, structural: More than 4 times per year    Marital Status: Married  Catering manager Violence: Not At Risk (04/22/2024)   Humiliation, Afraid, Rape, and Kick questionnaire    Fear of Current or Ex-Partner: No    Emotionally Abused: No    Physically Abused: No    Sexually Abused: No   Review of Systems: Gen: Denies fever, sweats or chills. No weight loss.  CV: Denies chest pain, palpitations or edema. Resp: Denies cough, shortness of breath of hemoptysis.  GI: See HPI.    GU : Denies urinary burning, blood in urine, increased urinary frequency or incontinence. MS: Denies joint pain, muscles aches or weakness. Derm: Denies rash, itchiness, skin lesions or unhealing ulcers. Psych: Denies depression, anxiety, memory loss or confusion. Heme: Denies easy bruising, bleeding. Neuro: See HPI. Endo:  Denies any problems with DM, thyroid or adrenal function.  Physical Exam: Vital signs in last 24 hours: Temp:  [  97.3 F (36.3 C)-98.2 F (36.8 C)] 97.7 F (36.5 C) (08/27 0426) Pulse Rate:  [72-78] 78 (08/27 0426) Resp:  [16-20] 18 (08/27 0426) BP:  (87-107)/(54-91) 94/56 (08/27 0426) SpO2:  [98 %-100 %] 98 % (08/27 0426) Weight:  [64.9 kg] 64.9 kg (08/26 1454) Last BM Date : 04/22/24 General:  Alert anxious 73 year old female in no acute distress. Head:  Normocephalic and atraumatic. Eyes:  No scleral icterus. Conjunctiva pink. Ears:  Normal auditory acuity. Nose:  No deformity, discharge or lesions. Mouth: Dentition intact. No ulcers or lesions.  Neck:  Supple. No lymphadenopathy or thyromegaly.  Lungs: Breath sounds clear throughout. No wheezes, rhonchi or crackles.  Heart: Regular rate and rhythm, no murmurs. Abdomen: Soft, nondistended. Nontender. No palpable mass. Positive bowel sounds to all quadrants. Midline abdominal scar intact. Rectal: Deferred. Musculoskeletal: Symmetrical without gross deformities.  Pulses: Normal pulses noted. Extremities: Without clubbing or edema. Neurologic: Alert and oriented x 4. No focal deficits.  Skin: Intact without significant lesions or rashes. Psych: Alert and cooperative. Normal mood and affect.  Intake/Output from previous day: No intake/output data recorded. Intake/Output this shift: No intake/output data recorded.  Lab Results: Recent Labs    04/22/24 1400 04/22/24 1855 04/23/24 0537  WBC 4.9  --  3.0*  HGB 9.5* 9.6* 8.9*  HCT 27.8* 29.5* 28.2*  PLT 177  --  138*   BMET Recent Labs    04/22/24 1400 04/23/24 0537  NA 139 141  K 3.9 3.9  CL 106 106  CO2 28 23  GLUCOSE 96 95  BUN 14 12  CREATININE 0.67 0.68  CALCIUM  9.5 9.6   LFT Recent Labs    04/22/24 1400  PROT 6.3*  ALBUMIN  4.0  AST 22  ALT 14  ALKPHOS 50  BILITOT 0.6   PT/INR No results for input(s): LABPROT, INR in the last 72 hours. Hepatitis Panel No results for input(s): HEPBSAG, HCVAB, HEPAIGM, HEPBIGM in the last 72 hours.   Studies/Results: DG Chest Portable 1 View Result Date: 04/22/2024 CLINICAL DATA:  Chest pain. Decreased blood counts over the last week. EXAM: PORTABLE  CHEST 1 VIEW COMPARISON:  02/07/2024 FINDINGS: Postoperative changes in the mediastinum. Calcification in the mitral valve annulus. Heart size and pulmonary vascularity are normal for technique. Lungs are clear. No pleural effusion or pneumothorax. Mediastinal contours appear intact. IMPRESSION: No active disease. Electronically Signed   By: Elsie Gravely M.D.   On: 04/22/2024 18:10    IMPRESSION/PLAN:  73 year old female with a history of recurrent GI bleed secondary to small bowel and colon AVMs sp right hemicolectomy and terminal ileal resection for bleeding AVMs in 1995 and subsequently underwent segmental jejunal resection for bleeding AVMs admitted 04/22/2024 with melena x 3 days and symptomatic anemia. On Plavix .  - H/H Q 8 hrs x 24 hrs - Transfuse for Hg level < 8 and as needed if symptomatic  - NPO for now - IV fluids per the hospitalist  - Hold Plavix   - Continue Pantoprazole  40mg  IV QD - Defer endoscopic recommendations to Dr. Legrand   Acute on chronic IDA secondary to GI blood loss. Admission Hg 9.6  (Hg 12.4 on 8/14). CBC today showed pancytopenia. WBC 3.0. Hg 8.9. PLT 138. - See plan above - Recommending holding Hydroxyurea  (dose recently increased per hematology, Hydroxyurea  can cause myelosuppression) - Consider hematology consult  Thrombocytosis with JAK 2 mutation on Hydroxyurea . Admission PLT  177 -> today 138.  -See recommendations above   History of severe MR  s/p percutaneous MVR with Mitra clip 11/2023 on Plavix . Last dose of Plavix  was 8:45 AM on 8/26.  -Continue to hold Plavix  -Challenging situation regarding chronic antiplatelet therapy use in setting or recurrent GI bleeding  Chronic HFpEF, LVEF 60 - 65 % per ECHO 12/2023  History of colon polyps.  Her most recent colonoscopy 05/2022 showed 2 hyperplastic polyps removed from the colon. -No further colon polyp surveillance colonoscopies recommended  [name]  04/23/2024, 9:26 AM   I have taken  an interval history, thoroughly reviewed the chart and examined the patient. I agree with the Advanced Practitioner's note, impression and recommendations, and have recorded additional findings, impressions and recommendations below. I performed a substantive portion of this encounter (>50% time spent), including a complete performance of the medical decision making.  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  Last episode was during hospitalization here October 2023, presumably small bowel AVMs based on endoscopic findings. Of note, she also had a telephone conference with Dr. Toribio Loveless of [facility] at that time for consideration of a double-balloon enteroscopy, but based on the overall scenario he did not feel that was needed at that point.  She did not have recurrence of overt bleeding until 3 days ago when intermittent melena began.  She has had a significant drop in hemoglobin from her recent baseline coincident with the bleeding, and the hemoglobin has continued to decline since admission yesterday.  Bleeding exacerbated by chronic use of Plavix , something she has been on since a mitral clip procedure several months ago. Multiple potential sources in the GI tract for this bleeding based on prior endoscopic reports.  At this point I am most suspicious of a small bowel source, so we are proceeding with a small bowel enteroscopy today. If she does not have convincing source of bleeding on this study, then we will proceed with a colonoscopy tomorrow.  All this was discussed in detail with her and she was agreeable to the enteroscopy after discussion of procedure and risks.  The benefits and risks of the planned procedure(s) were described in detail with the patient or (when appropriate) their health care proxy.  Risks  were outlined as including, but not limited to, bleeding, infection, perforation, adverse medication reaction leading to cardiac or pulmonary decompensation, pancreatitis (if ERCP).  The limitation of incomplete mucosal visualization was also discussed.  No guarantees or warranties were given.  Patient at increased risk for cardiopulmonary complications of procedure due to medical comorbidities.  _________________  This consultation required a high degree of medical decision making due to the nature and complexity of the acute condition(s) being evaluated as well as the patient's medical comorbidities.  Victory LITTIE Brand III [phone]

## 2024-04-23 NOTE — Transfer of Care (Signed)
 Immediate Anesthesia Transfer of Care Note  Patient: Kristina Osborne  Procedure(s) Performed: ENTEROSCOPY  Patient Location: PACU and Endoscopy Unit  Anesthesia Type:MAC  Level of Consciousness: awake, alert , and oriented  Airway & Oxygen Therapy: Patient Spontanous Breathing and Patient connected to face mask oxygen  Post-op Assessment: Report given to RN and Post -op Vital signs reviewed and stable  Post vital signs: Reviewed and stable  Last Vitals:  Vitals Value Taken Time  BP 80/40 04/23/24 15:53  Temp    Pulse 82 04/23/24 15:55  Resp 24 04/23/24 15:55  SpO2 100 % 04/23/24 15:55  Vitals shown include unfiled device data.  Last Pain:  Vitals:   04/23/24 1434  TempSrc:   PainSc: 0-No pain         Complications: No notable events documented.

## 2024-04-23 NOTE — H&P (View-Only) (Signed)
 Referring Provider: Tinnie Matter PA-C Primary Care Physician:  Norleen Lynwood ORN, MD Primary Gastroenterologist:  [facility], previously Dr. Aneita  Reason for Consultation:  GI bleed, melena, anemia   HPI: Kristina Osborne is a 73 y.o. 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 Federico for follow up on 04/10/2024. At that time, she endorsed feeling well without any bloody or black stools. On Plavix , no ASA. She noted having intermittent mild stomach burning with occasional loose stools. Hydroxyurea  dose was increased to 1000mg  bid and potential future bone marrow biopsy was considered to assure that essential thrombocytopenia is the diagnosis. Last received IV iron  09/2023. Lab 8/14 which showed a Hg level of 12.4. HCT 35.5. PLT count 418. Ferritin 37. She started passing black stools on Sunday 8/24 concerning for recurrent GI bleeding. Repeat labs at the cancer center 04/22/2024 showed her Hg level dropped to 9.5 and she was sent to the ED for further evaluation. GI consult was requested for further evaluation regarding GI bleed and acute on chronic anemia.   Labs 04/22/2024 at the cancer center: WBC 4 point.  Hemoglobin 9.5.  Hematocrit 27.8.  Platelets 177.  Ferritin 37.  BUN 14.  Creatinine 0.67.  Bili 0.6.  Alk phos was 50.  AST 22.  ALT 14.  Labs in the ED: Hemoglobin of 9.6.  Hematocrit 29.5.  FOBT  positive.  Troponin < 15.  Labs today: WBC 3.0.  Hemoglobin 8.9.  Hematocrit 28.2.  Platelets 138.  Sodium 140. Potassium 3.9.  BUN 12.  Creatinine 0.68.   She typically passes a brown stool most days, stools are loose since her colon and small bowel surgeries. She occasionally passes a solid stool. She passed 4 loose black stools on Sunday 8/24, x 3 episodes on 8/25 and yesterday passed a loose black stool mixed with solid stool x 1 at home then 1 episode in the ED and x 1 episode later yesterday evening. Last loose black stool was 4 AM today. No bright red blood per the rectum. She has occasional stomach burning which is chronic. No heartburn or dysphagia. She takes Famotidine  and Sucralfate  nightly. No nausea or vomiting. She has intermittent lower abdominal cramping when passing loose stools. No Pepto-Bismol use. She does not take oral iron . Since admission, she feels slightly lightheaded and a little short winded when ambulating. No chest pain, last dose of Plavix  was 8:45 AM on 8/26.  No ASA or NSAIDs. Non-smoker. No alcohol use.  She has a significant history of recurrent GI bleeding secondary to small bowel and colon AVMs. Her most recent hospitalization for GI bleeding was 10/28 - 06/27/2022. During that hospitalization, she underwent a SBE which showed LA Grade A esophagitis, a few duodenal AVMs treated with APC, colonoscopy showed multiple large nonbleeding right colon angioectasias and 2 small hyperplastic colon polyps and VCE showing a tattoo at the distal site of SBE and one small angioectasia at 58 minutes just beyond the tattoo. Admission  Hg 10.5, she did not require blood transfusions.  She received IV iron  x 1.  Hemoglobin 9.4 at discharge.  She wishes to go home as soon as possible as she takes care of her husband who had a past CVA.   ECHO 01/10/2024:  Left ventricular ejection fraction, by estimation, is 60 to 65%. Left ventricular ejection fraction by 3D volume is 60 %. The left  ventricle has normal function. The left ventricle has no regional wall motion abnormalities. Left ventricular diastolic parameters are consistent with Grade I diastolic dysfunction (impaired relaxation). The average left ventricular global longitudinal strain is -24.7 %. The global longitudinal strain is normal. 1. Right ventricular systolic function is normal. The right ventricular size is normal. There is normal pulmonary artery systolic pressure. The estimated right ventricular systolic pressure is 25.5 mmHg. 2. 3. Left atrial size was moderately dilated. 4. No definite ASD visualized on this study. Status post mTEER. 2 Mitraclips in the A2/P2 position. Mildly elevated mean gradient 4 mmHg. There is mild-moderate residual mitral regurgitation. 5. The aortic valve is tricuspid. Aortic valve regurgitation is trivial. No aortic stenosis is present. 6. The inferior vena cava is normal in size with greater than 50% respiratory variability, suggesting right atrial pressure of 3 mmHg.   MOST RECENT GI PROCEDURES:   Small bowel enteroscopy 06/26/2022:  - LA Grade A reflux esophagitis with no bleeding.  - Normal stomach.  - Normal examined duodenum.  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 Treated with argon plasma coagulation (APC). These small lesions are unlikely to explain the patient's overt melena/hematochezia and drop in hemoglobin  - Areas from the jejunum at 140 cm from the incisors were tattooed.  - No specimens collected.   Colonoscopy 06/26/2022:  - Multiple non-bleeding colonic angioectasias. These lesions were multiple and large, involving a large surface of the right colon near the ileocolonic anastomosis. There was not active bleeding and it is not known which, if any of these vascular lesions was the culprit for the bleed. Definitive treatment with APC would involve extensive ablation of the colonic mucosa, though to be high risk for complications such as perforation.   - One 5 mm polyp in the descending colon, removed with a cold snare. Resected and retrieved.  - One 4 mm polyp at the recto-sigmoid colon, removed with a cold snare. Resected and retrieved. Clip (MR conditional) was placed. Clip manufacturer: [employer].   - Decreased mucosa vascular pattern in the descending colon and in the transverse colon. Biopsied.  - The examined portion of the ileum was normal.  - The distal rectum and anal verge are normal on retroflexion view.  - No further colon polyp surveillance colonoscopies recommended   A. COLON, TRANSVERSE, BIOPSY:  Mild nonspecific inflammation consistent with prep related changes in  fragments of colonic mucosa.  There are no diagnostic features of inflammatory bowel disease,  microscopic colitis and collagenous colitis.   B. COLON, DESCENDING, POLYPECTOMY:  Hyperplastic polyp.  Negative for dysplasia.   C. COLON, RECTOSIGMOID, POLYPECTOMY:  Hyperplastic polyp.  Negative for dysplasia.   Small bowel capsule endoscopy 06/26/2022:     Small bowel endoscopy 04/17/2022:  - Normal stomach.  - Erythematous duodenopathy in bulb without bleeding.  - Two recently bleeding angioectasias in the jejunum. Treated with argon plasma coagulation (APC).  - Jejunal polyp. Resected and retrieved. Clip (MR conditional) was placed   Colonoscopy 04/17/2022:  - Patent end-to-side ileo-colonic anastomosis, characterized by healthy appearing mucosa.  - The  examined portion of the ileum was normal.  - Patches of telangiectasias (telangiectatic vessels) without bleeding scattered in the descending colon, in the transverse colon and in the ascending colon.  - One 6 mm polyp in the proximal sigmoid colon, removed with a hot snare. Resected and retrieved.  - Internal hemorrhoids.  A. JEJUNUM, PROXIMAL, POLYPECTOMY:  - Hyperplastic small bowel polyp.  - Negative for dysplasia or malignancy.   B. COLON, SIGMOID, POLYPECTOMY:  - Inflammatory polyp.   - Negative for dysplasia or malignancy.   Past Medical History:  Diagnosis Date   ABLA (acute blood loss anemia) 04/16/2022   Congenital malformation of intestinal fixation    Gastritis    Gastroesophageal reflux disease without esophagitis 09/10/2021   IDA (iron  deficiency anemia)    Mitral valve prolapse    Persistent hyperplasia of thymus (HCC)    S/P Thymectomy   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 Wendel   Thrombocytosis     Past Surgical History:  Procedure Laterality Date   BIOPSY  06/26/2022   Procedure: BIOPSY;  Surgeon: Stacia Glendia BRAVO, MD;  Location: [facility];  Service: Gastroenterology;;   COLONOSCOPY N/A 04/17/2022   Procedure: COLONOSCOPY;  Surgeon: Albertus Gordy HERO, MD;  Location: [facility];  Service: Gastroenterology;  Laterality: N/A;   COLONOSCOPY WITH PROPOFOL  N/A 06/26/2022   Procedure: COLONOSCOPY WITH PROPOFOL ;  Surgeon: Stacia Glendia BRAVO, MD;  Location: [facility];  Service: Gastroenterology;  Laterality: N/A;   ENTEROSCOPY N/A 04/17/2022   Procedure: ENTEROSCOPY;  Surgeon: Albertus Gordy HERO, MD;  Location: [facility];  Service: Gastroenterology;  Laterality: N/A;   ENTEROSCOPY N/A 06/26/2022   Procedure: ENTEROSCOPY;  Surgeon: Stacia Glendia BRAVO, MD;  Location: [facility];  Service: Gastroenterology;  Laterality: N/A;   GIVENS CAPSULE STUDY N/A 06/26/2022   Procedure: GIVENS CAPSULE STUDY;  Surgeon: Stacia Glendia BRAVO, MD;  Location: [facility];  Service: Gastroenterology;  Laterality: N/A;   HEMOSTASIS CLIP PLACEMENT  04/17/2022   Procedure: HEMOSTASIS CLIP PLACEMENT;  Surgeon: Albertus Gordy HERO, MD;  Location: [facility];  Service: Gastroenterology;;   HEMOSTASIS CLIP PLACEMENT  06/26/2022   Procedure: HEMOSTASIS CLIP PLACEMENT;  Surgeon: Stacia Glendia BRAVO, MD;  Location: [facility];  Service: Gastroenterology;;   HOT HEMOSTASIS N/A 04/17/2022    Procedure: HOT HEMOSTASIS (ARGON PLASMA COAGULATION/BICAP);  Surgeon: Albertus Gordy HERO, MD;  Location: [facility];  Service: Gastroenterology;  Laterality: N/A;   HOT HEMOSTASIS N/A 06/26/2022   Procedure: HOT HEMOSTASIS (ARGON PLASMA COAGULATION/BICAP);  Surgeon: Stacia Glendia BRAVO, MD;  Location: [facility];  Service: Gastroenterology;  Laterality: N/A;   POLYPECTOMY  04/17/2022   Procedure: POLYPECTOMY;  Surgeon: Albertus Gordy HERO, MD;  Location: [facility];  Service: Gastroenterology;;   POLYPECTOMY  06/26/2022   Procedure: POLYPECTOMY;  Surgeon: Stacia Glendia BRAVO, MD;  Location: [facility];  Service: Gastroenterology;;   RIGHT/LEFT HEART CATH AND CORONARY ANGIOGRAPHY N/A 11/01/2023   Procedure: RIGHT/LEFT HEART CATH AND CORONARY ANGIOGRAPHY;  Surgeon: Darron Deatrice LABOR, MD;  Location: [facility];  Service: Cardiovascular;  Laterality: N/A;   SMALL INTESTINE SURGERY  1994   SUBMUCOSAL TATTOO INJECTION  06/26/2022   Procedure: SUBMUCOSAL TATTOO INJECTION;  Surgeon: Stacia Glendia BRAVO, MD;  Location: [facility];  Service: Gastroenterology;;   THYMECTOMY N/A    TRANSCATHETER MITRAL EDGE TO EDGE REPAIR N/A 11/28/2023   Procedure: TRANSCATHETER MITRAL EDGE TO EDGE REPAIR;  Surgeon: Thukkani, Arun K,  MD;  Location: [facility];  Service: Cardiovascular;  Laterality: N/A;   TRANSESOPHAGEAL ECHOCARDIOGRAM (CATH LAB) N/A 10/24/2023   Procedure: TRANSESOPHAGEAL ECHOCARDIOGRAM;  Surgeon: Mona Vinie BROCKS, MD;  Location: [facility];  Service: Cardiovascular;  Laterality: N/A;   TRANSESOPHAGEAL ECHOCARDIOGRAM (CATH LAB) N/A 11/28/2023   Procedure: TRANSESOPHAGEAL ECHOCARDIOGRAM;  Surgeon: Wendel Lurena POUR, MD;  Location: [facility];  Service: Cardiovascular;  Laterality: N/A;    Prior to Admission medications   Medication Sig Start Date End Date Taking? Authorizing Provider  acetaminophen  (TYLENOL ) 500 MG tablet Take 1,000 mg by mouth every 6 (six) hours as needed for  headache (pain.).   Yes [provider]  amoxicillin  (AMOXIL ) 500 MG tablet Take 4 tablets (2,000 mg total) by mouth as directed. 1 hour prior to dental work including cleanings Patient taking differently: Take 2,000 mg by mouth as needed (dental work). 1 hour prior to dental work including cleanings 12/03/23  Yes Sebastian Lamarr SAUNDERS, PA-C  cholestyramine  light (PREVALITE ) 4 g packet Take 2 g by mouth 2 (two) times daily as needed.   Yes [provider]  clopidogrel  (PLAVIX ) 75 MG tablet Take 1 tablet (75 mg total) by mouth daily with breakfast. 11/30/23  Yes Sebastian Lamarr SAUNDERS, PA-C  famotidine  (PEPCID ) 40 MG tablet TAKE 1 TABLET(40 MG) BY MOUTH DAILY 02/19/24  Yes Dorsey, John T IV, MD  folic acid  (FOLVITE ) 1 MG tablet TAKE 1 TABLET(1 MG) BY MOUTH DAILY Patient taking differently: Take 1 mg by mouth at bedtime. 11/21/23  Yes Federico Norleen ONEIDA MADISON, MD  furosemide  (LASIX ) 40 MG tablet Take 1 tablet (40 mg total) by mouth as needed for fluid or edema. 12/03/23 04/22/24 Yes Sebastian Lamarr SAUNDERS, PA-C  Glucosamine-Chondroitin (COSAMIN DS PO) Take 1 tablet by mouth daily.   Yes [provider]  hydroxyurea  (HYDREA ) 500 MG capsule Take 2 capsules (1,000 mg total) by mouth 2 (two) times daily. May take with food to minimize GI side effects. Patient taking differently: Take 500 mg by mouth See admin instructions. Takes 2 tablets in the morning and 2 tablets at bedtime on Mondays, Wednesdays, and Fridays. Takes 2 tablet in the morning and 1 tablet at bedtime on Tuesdays, Thursdays, Sat, and Sun. 01/28/24  Yes Dorsey, John T IV, MD  Lycopene 10 MG CAPS Take 10 mg by mouth daily.   Yes [provider]  sucralfate  (CARAFATE ) 1 g tablet TAKE 1 TABLET(1 GRAM) BY MOUTH FOUR TIMES DAILY AT BEDTIME WITH MEALS 08/31/23  Yes Dorsey, John T IV, MD  dicyclomine  (BENTYL ) 20 MG tablet Take 1 tablet (20 mg total) by mouth 2 (two) times daily. Patient not taking: Reported on 04/22/2024 02/07/24   Theotis Cameron HERO, PA-C    Current Facility-Administered Medications  Medication Dose Route Frequency Provider Last Rate Last Admin   acetaminophen  (TYLENOL ) tablet 650 mg  650 mg Oral Q6H PRN Zella, Mir M, MD       Or   acetaminophen  (TYLENOL ) suppository 650 mg  650 mg Rectal Q6H PRN Zella, Mir M, MD       albuterol  (PROVENTIL ) (2.5 MG/3ML) 0.083% nebulizer solution 2.5 mg  2.5 mg Nebulization Q2H PRN Zella, Mir M, MD       ondansetron  (ZOFRAN ) tablet 4 mg  4 mg Oral Q6H PRN Zella, Mir M, MD       Or   ondansetron  (ZOFRAN ) injection 4 mg  4 mg Intravenous Q6H PRN Zella Katha HERO, MD  pantoprazole  (PROTONIX ) injection 40 mg  40 mg Intravenous Q24H Zella, Mir M, MD   40 mg at 04/22/24 1907   traZODone  (DESYREL ) tablet 25 mg  25 mg Oral QHS PRN Zella Katha HERO, MD   25 mg at 04/23/24 0000    Allergies as of 04/22/2024 - Review Complete 04/22/2024  Allergen Reaction Noted   Aspirin Other (See Comments) 09/01/2021   Sulfa antibiotics Other (See Comments) 04/08/2021    Family History  Problem Relation Age of Onset   Heart disease Mother    Valvular heart disease Mother    Healthy Sister    Alcoholism Half-Sister     Social History   Socioeconomic History   Marital status: Married    Spouse name: Not on file   Number of children: 0   Years of education: Not on file   Highest education level: Not on file  Occupational History   Not on file  Tobacco Use   Smoking status: Former    Current packs/day: 0.00    Types: Cigarettes    Quit date: 2002    Years since quitting: 23.6   Smokeless tobacco: Never   Tobacco comments:    Started smoking in her early 20's.    Smoked 0.5PP at her heaviest.   Vaping Use   Vaping status: Never Used  Substance and Sexual Activity   Alcohol use: Not Currently   Drug use: Never   Sexual activity: Yes    Partners: Male  Other Topics Concern   Not on file  Social History Narrative   Not on file   Social Drivers  of Health   Financial Resource Strain: Not on file  Food Insecurity: No Food Insecurity (04/22/2024)   Hunger Vital Sign    Worried About Running Out of Food in the Last Year: Never true    Ran Out of Food in the Last Year: Never true  Transportation Needs: No Transportation Needs (04/22/2024)   PRAPARE - Administrator, Civil Service (Medical): No    Lack of Transportation (Non-Medical): No  Physical Activity: Not on file  Stress: Not on file  Social Connections: Socially Integrated (04/22/2024)   Social Connection and Isolation Panel    Frequency of Communication with Friends and Family: More than three times a week    Frequency of Social Gatherings with Friends and Family: Once a week    Attends Religious Services: More than 4 times per year    Active Member of [employer] or Organizations: Yes    Attends Engineer, structural: More than 4 times per year    Marital Status: Married  Catering manager Violence: Not At Risk (04/22/2024)   Humiliation, Afraid, Rape, and Kick questionnaire    Fear of Current or Ex-Partner: No    Emotionally Abused: No    Physically Abused: No    Sexually Abused: No   Review of Systems: Gen: Denies fever, sweats or chills. No weight loss.  CV: Denies chest pain, palpitations or edema. Resp: Denies cough, shortness of breath of hemoptysis.  GI: See HPI.    GU : Denies urinary burning, blood in urine, increased urinary frequency or incontinence. MS: Denies joint pain, muscles aches or weakness. Derm: Denies rash, itchiness, skin lesions or unhealing ulcers. Psych: Denies depression, anxiety, memory loss or confusion. Heme: Denies easy bruising, bleeding. Neuro: See HPI. Endo:  Denies any problems with DM, thyroid or adrenal function.  Physical Exam: Vital signs in last 24 hours: Temp:  [  97.3 F (36.3 C)-98.2 F (36.8 C)] 97.7 F (36.5 C) (08/27 0426) Pulse Rate:  [72-78] 78 (08/27 0426) Resp:  [16-20] 18 (08/27 0426) BP:  (87-107)/(54-91) 94/56 (08/27 0426) SpO2:  [98 %-100 %] 98 % (08/27 0426) Weight:  [64.9 kg] 64.9 kg (08/26 1454) Last BM Date : 04/22/24 General:  Alert anxious 73 year old female in no acute distress. Head:  Normocephalic and atraumatic. Eyes:  No scleral icterus. Conjunctiva pink. Ears:  Normal auditory acuity. Nose:  No deformity, discharge or lesions. Mouth: Dentition intact. No ulcers or lesions.  Neck:  Supple. No lymphadenopathy or thyromegaly.  Lungs: Breath sounds clear throughout. No wheezes, rhonchi or crackles.  Heart: Regular rate and rhythm, no murmurs. Abdomen: Soft, nondistended. Nontender. No palpable mass. Positive bowel sounds to all quadrants. Midline abdominal scar intact. Rectal: Deferred. Musculoskeletal: Symmetrical without gross deformities.  Pulses: Normal pulses noted. Extremities: Without clubbing or edema. Neurologic: Alert and oriented x 4. No focal deficits.  Skin: Intact without significant lesions or rashes. Psych: Alert and cooperative. Normal mood and affect.  Intake/Output from previous day: No intake/output data recorded. Intake/Output this shift: No intake/output data recorded.  Lab Results: Recent Labs    04/22/24 1400 04/22/24 1855 04/23/24 0537  WBC 4.9  --  3.0*  HGB 9.5* 9.6* 8.9*  HCT 27.8* 29.5* 28.2*  PLT 177  --  138*   BMET Recent Labs    04/22/24 1400 04/23/24 0537  NA 139 141  K 3.9 3.9  CL 106 106  CO2 28 23  GLUCOSE 96 95  BUN 14 12  CREATININE 0.67 0.68  CALCIUM  9.5 9.6   LFT Recent Labs    04/22/24 1400  PROT 6.3*  ALBUMIN  4.0  AST 22  ALT 14  ALKPHOS 50  BILITOT 0.6   PT/INR No results for input(s): LABPROT, INR in the last 72 hours. Hepatitis Panel No results for input(s): HEPBSAG, HCVAB, HEPAIGM, HEPBIGM in the last 72 hours.   Studies/Results: DG Chest Portable 1 View Result Date: 04/22/2024 CLINICAL DATA:  Chest pain. Decreased blood counts over the last week. EXAM: PORTABLE  CHEST 1 VIEW COMPARISON:  02/07/2024 FINDINGS: Postoperative changes in the mediastinum. Calcification in the mitral valve annulus. Heart size and pulmonary vascularity are normal for technique. Lungs are clear. No pleural effusion or pneumothorax. Mediastinal contours appear intact. IMPRESSION: No active disease. Electronically Signed   By: Elsie Gravely M.D.   On: 04/22/2024 18:10    IMPRESSION/PLAN:  73 year old female with a history of recurrent GI bleed secondary to small bowel and colon AVMs sp right hemicolectomy and terminal ileal resection for bleeding AVMs in 1995 and subsequently underwent segmental jejunal resection for bleeding AVMs admitted 04/22/2024 with melena x 3 days and symptomatic anemia. On Plavix .  - H/H Q 8 hrs x 24 hrs - Transfuse for Hg level < 8 and as needed if symptomatic  - NPO for now - IV fluids per the hospitalist  - Hold Plavix   - Continue Pantoprazole  40mg  IV QD - Defer endoscopic recommendations to Dr. Legrand   Acute on chronic IDA secondary to GI blood loss. Admission Hg 9.6  (Hg 12.4 on 8/14). CBC today showed pancytopenia. WBC 3.0. Hg 8.9. PLT 138. - See plan above - Recommending holding Hydroxyurea  (dose recently increased per hematology, Hydroxyurea  can cause myelosuppression) - Consider hematology consult  Thrombocytosis with JAK 2 mutation on Hydroxyurea . Admission PLT  177 -> today 138.  -See recommendations above   History of severe MR  s/p percutaneous MVR with Mitra clip 11/2023 on Plavix . Last dose of Plavix  was 8:45 AM on 8/26.  -Continue to hold Plavix  -Challenging situation regarding chronic antiplatelet therapy use in setting or recurrent GI bleeding  Chronic HFpEF, LVEF 60 - 65 % per ECHO 12/2023  History of colon polyps.  Her most recent colonoscopy 05/2022 showed 2 hyperplastic polyps removed from the colon. -No further colon polyp surveillance colonoscopies recommended  [name]  04/23/2024, 9:26 AM   I have taken  an interval history, thoroughly reviewed the chart and examined the patient. I agree with the Advanced Practitioner's note, impression and recommendations, and have recorded additional findings, impressions and recommendations below. I performed a substantive portion of this encounter (>50% time spent), including a complete performance of the medical decision making.  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  Last episode was during hospitalization here October 2023, presumably small bowel AVMs based on endoscopic findings. Of note, she also had a telephone conference with Dr. Toribio Loveless of [facility] at that time for consideration of a double-balloon enteroscopy, but based on the overall scenario he did not feel that was needed at that point.  She did not have recurrence of overt bleeding until 3 days ago when intermittent melena began.  She has had a significant drop in hemoglobin from her recent baseline coincident with the bleeding, and the hemoglobin has continued to decline since admission yesterday.  Bleeding exacerbated by chronic use of Plavix , something she has been on since a mitral clip procedure several months ago. Multiple potential sources in the GI tract for this bleeding based on prior endoscopic reports.  At this point I am most suspicious of a small bowel source, so we are proceeding with a small bowel enteroscopy today. If she does not have convincing source of bleeding on this study, then we will proceed with a colonoscopy tomorrow.  All this was discussed in detail with her and she was agreeable to the enteroscopy after discussion of procedure and risks.  The benefits and risks of the planned procedure(s) were described in detail with the patient or (when appropriate) their health care proxy.  Risks  were outlined as including, but not limited to, bleeding, infection, perforation, adverse medication reaction leading to cardiac or pulmonary decompensation, pancreatitis (if ERCP).  The limitation of incomplete mucosal visualization was also discussed.  No guarantees or warranties were given.  Patient at increased risk for cardiopulmonary complications of procedure due to medical comorbidities.  _________________  This consultation required a high degree of medical decision making due to the nature and complexity of the acute condition(s) being evaluated as well as the patient's medical comorbidities.  Victory LITTIE Brand III [phone]

## 2024-04-23 NOTE — Progress Notes (Signed)
 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  She was also noticing fatigue and lightheadedness.  In the emergency room hemodynamically stable.  Hemoglobin 9.5.  Melanotic occult blood positive stool.  Recent hemoglobin was 12 lower was 10 days ago.  Admitted with GI consultation.  Subjective: Patient seen and examined.  On my exam, she denied any complaints.  She was dry and thirsty.  Started on IV fluids.  Discussed with GI.  Plan for enteroscopy. 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  Symptomatic anemia. IV fluids, n.p.o. except meds.  Plavix  on hold.  IV PPI daily. Closely monitor hemoglobin every 12 hours.  Currently no indication for blood transfusion.  Will recheck levels in the evening.  Iron  levels are adequate.  Essential thrombocytosis: Patient on hydroxyurea .  Morning labs with pancytopenia.  Recheck in the evening to rule out any lab errors.  Holding hydroxyurea .  Mitral valve prolapse status post MitraClip: Patient needed to be on lifelong anticoagulation, Plavix  preferred.  Currently on hold.  No indication to bridge until procedure.    DVT prophylaxis: SCDs Start: 04/22/24 1828   Code Status: Full code Family Communication: None at the bedside Disposition Plan: Status is: Inpatient Remains inpatient appropriate because: IV fluids, inpatient procedures     Consultants:  Gastroenterology  Procedures:  None  Antimicrobials:  None     Objective: Vitals:   04/22/24 2005 04/23/24 0018 04/23/24 0426 04/23/24 0830  BP: 97/63 (!) 87/54 (!) 94/56 92/69  Pulse: 72 75 78 72  Resp: 20 16 18 18   Temp: 97.8 F (36.6  C) (!) 97.3 F (36.3 C) 97.7 F (36.5 C) 98 F (36.7 C)  TempSrc: Oral Oral Oral Oral  SpO2: 100% 99% 98% 100%  Weight:      Height:       No intake or output data in the 24 hours ending 04/23/24 1248 Filed Weights   04/22/24 1454  Weight: 64.9 kg    Examination:  General exam: Appears calm and comfortable.  Frail.  Pleasant to interaction. Respiratory system: Clear to auscultation. Respiratory effort normal. Cardiovascular system: S1 & S2 heard, RRR. No JVD, murmurs, rubs, gallops or clicks. No pedal edema. Gastrointestinal system: Abdomen is nondistended, soft and nontender. No organomegaly or masses felt. Normal bowel sounds heard. Central nervous system: Alert and oriented. No focal neurological deficits. Extremities: Symmetric 5 x 5 power.    Data Reviewed: I have personally reviewed following labs and imaging studies  CBC: Recent Labs  Lab 04/22/24 1400 04/22/24 1855 04/23/24 0537  WBC 4.9  --  3.0*  NEUTROABS 3.8  --   --   HGB 9.5* 9.6* 8.9*  HCT 27.8* 29.5* 28.2*  MCV 129.3*  --  135.6*  PLT 177  --  138*   Basic Metabolic Panel: Recent Labs  Lab 04/22/24 1400 04/23/24 0537  NA 139 141  K 3.9 3.9  CL 106 106  CO2 28 23  GLUCOSE 96 95  BUN 14 12  CREATININE 0.67 0.68  CALCIUM  9.5 9.6   GFR: Estimated Creatinine Clearance: 61.8 mL/min (by C-G formula based on SCr of 0.68 mg/dL). Liver Function Tests: Recent Labs  Lab 04/22/24 1400  AST 22  ALT 14  ALKPHOS 50  BILITOT 0.6  PROT 6.3*  ALBUMIN  4.0   No results for input(s): LIPASE, AMYLASE in the last 168 hours. No results for input(s): AMMONIA in the last 168 hours. Coagulation Profile: No results for input(s): INR, PROTIME in the last 168 hours. Cardiac Enzymes: No results for input(s): CKTOTAL, CKMB, CKMBINDEX, TROPONINI in the last 168 hours. BNP (last 3 results) Recent Labs    11/26/23 1148  PROBNP 474*   HbA1C: No results for input(s): HGBA1C in the last  72 hours. CBG: No results for input(s): GLUCAP in the last 168 hours. Lipid Profile: No results for input(s): CHOL, HDL, LDLCALC, TRIG, CHOLHDL, LDLDIRECT in the last 72 hours. Thyroid Function Tests: No results for input(s): TSH, T4TOTAL, FREET4, T3FREE, THYROIDAB in the last 72 hours. Anemia Panel: Recent Labs    04/22/24 1358  FERRITIN 37   Sepsis Labs: No results for input(s): PROCALCITON, LATICACIDVEN in the last 168 hours.  No results found for this or any previous visit (from the past 240 hours).       Radiology Studies: DG Chest Portable 1 View Result Date: 04/22/2024 CLINICAL DATA:  Chest pain. Decreased blood counts over the last week. EXAM: PORTABLE CHEST 1 VIEW COMPARISON:  02/07/2024 FINDINGS: Postoperative changes in the mediastinum. Calcification in the mitral valve annulus. Heart size and pulmonary vascularity are normal for technique. Lungs are clear. No pleural effusion or pneumothorax. Mediastinal contours appear intact. IMPRESSION: No active disease. Electronically Signed   By: Elsie Gravely M.D.   On: 04/22/2024 18:10        Scheduled Meds:  pantoprazole  (PROTONIX ) IV  40 mg Intravenous Q24H   Continuous Infusions:  sodium chloride  100 mL/hr at 04/23/24 1108     LOS: 1 day    Time spent: 40 minutes    Renato Applebaum, MD [facility]

## 2024-04-23 NOTE — Anesthesia Procedure Notes (Signed)
 Procedure Name: MAC Date/Time: 04/23/2024 3:40 PM  Performed by: Obadiah Reyes BROCKS, CRNAPre-anesthesia Checklist: Patient identified, Emergency Drugs available, Suction available and Patient being monitored Patient Re-evaluated:Patient Re-evaluated prior to induction Oxygen Delivery Method: Circle system utilized and Simple face mask Preoxygenation: Pre-oxygenation with 100% oxygen Induction Type: IV induction

## 2024-04-23 NOTE — Anesthesia Postprocedure Evaluation (Signed)
 Anesthesia Post Note  Patient: Kristina Osborne  Procedure(s) Performed: ENTEROSCOPY     Patient location during evaluation: Endoscopy Anesthesia Type: MAC Level of consciousness: awake and alert Pain management: pain level controlled Vital Signs Assessment: post-procedure vital signs reviewed and stable Respiratory status: spontaneous breathing, nonlabored ventilation, respiratory function stable and patient connected to nasal cannula oxygen Cardiovascular status: blood pressure returned to baseline and stable Postop Assessment: no apparent nausea or vomiting Anesthetic complications: no   No notable events documented.  Last Vitals:  Vitals:   04/23/24 1557 04/23/24 1600  BP: (!) 92/41 (!) 97/39  Pulse: 88 88  Resp: (!) 21 20  Temp:    SpO2: 100% 99%    Last Pain:  Vitals:   04/23/24 1554  TempSrc: Temporal  PainSc: 0-No pain                 [name]

## 2024-04-23 NOTE — Op Note (Signed)
 [facility] Patient Name: Kristina Osborne Procedure Date: 04/23/2024 MRN: [id number] Attending MD: Victory CROME. Legrand , MD, [id number] Date of Birth: 29-Mar-1951 CSN: [id number] Age: 73 Admit Type: Inpatient Procedure:                Small bowel enteroscopy Indications:              Melena, Acute blood loss anemia                           Hx of small bowel and colonic AVMs (see inpatient                            consult note for details) Providers:                Victory L. Legrand, MD, Randall Lines, RN, Felice Sar,                            Technician Referring MD:             [facility] Medicines:                Monitored Anesthesia Care Complications:            No immediate complications. Estimated Blood Loss:     Estimated blood loss: none. Procedure:                Pre-Anesthesia Assessment:                           - Prior to the procedure, a History and Physical                            was performed, and patient medications and                            allergies were reviewed. The patient's tolerance of                            previous anesthesia was also reviewed. The risks                            and benefits of the procedure and the sedation                            options and risks were discussed with the patient.                            All questions were answered, and informed consent                            was obtained. Prior Anticoagulants: The patient has                            taken no anticoagulant or antiplatelet agents. ASA  Grade Assessment: III - A patient with severe                            systemic disease. After reviewing the risks and                            benefits, the patient was deemed in satisfactory                            condition to undergo the procedure.                           After obtaining informed consent, the endoscope was                            passed  under direct vision. Throughout the                            procedure, the patient's blood pressure, pulse, and                            oxygen saturations were monitored continuously. The                            PCF-HQ190DL ([id number]) olympus colonscope was                            introduced through the mouth and advanced to the                            proximal jejunum. The small bowel enteroscopy was                            accomplished without difficulty. The patient                            tolerated the procedure well. Scope In: Scope Out: Findings:      Two In[place] patches were seen in the proximal esophagus. Esophagus was       otherwise normal.      The stomach was normal. (other than mild mucosal scope trauma with       flecks of heme noted on scope withrawal)      The examined duodenum was normal.      A tattoo was seen in the proximal jejunum. Scope could not be advanced       beyond that area on this exam.      A 5 mm sessile polyp with was found in the proximal jejunum.       Benign-appearing and similar description and location to one described       on 2023 VCE. Not Bx or removed b/c CVE may be needed this admission.      Exam of the jejunum was otherwise normal. Impression:               - Normal stomach.                           -  Normal examined duodenum.                           - A tattoo was seen in the jejunum. It as placed                            during the inpatient SBE in 2023.                           - Jejunal polyp. Benign-appearing. This looks like                            slightly polypoid normal small bowel mucosa.                           - No specimens collected. Recommendation:           - Return patient to hospital ward for ongoing care.                           - CBC this evening and tomorrow AM                           - Colonoscopy tomorrow (with possible VCE                            immediately afterward if no  source of bleeding seen) Procedure Code(s):        --- Professional ---                           [id number], Small intestinal endoscopy, enteroscopy                            beyond second portion of duodenum, not including                            ileum; diagnostic, including collection of                            specimen(s) by brushing or washing, when performed                            (separate procedure) Diagnosis Code(s):        --- Professional ---                           D13.39, Benign neoplasm of other parts of small                            intestine                           K92.1, Melena (includes Hematochezia) CPT copyright 2022 American [employer] Association. All rights reserved. The codes documented in this report are preliminary and upon coder review may  be revised to meet current compliance requirements. Demeka Sutter L. Legrand, MD  04/23/2024 3:54:54 PM This report has been signed electronically. Number of Addenda: 0

## 2024-04-23 NOTE — Plan of Care (Signed)

## 2024-04-24 ENCOUNTER — Telehealth: Payer: Self-pay | Admitting: Gastroenterology

## 2024-04-24 ENCOUNTER — Encounter (HOSPITAL_COMMUNITY): Payer: Self-pay | Admitting: Internal Medicine

## 2024-04-24 ENCOUNTER — Encounter (HOSPITAL_COMMUNITY)
Admission: EM | Disposition: A | Discharge status: Home / Self Care | Payer: Self-pay | Source: Ambulatory Visit | Attending: Internal Medicine

## 2024-04-24 ENCOUNTER — Inpatient Hospital Stay (HOSPITAL_COMMUNITY): Admitting: Certified Registered Nurse Anesthetist

## 2024-04-24 DIAGNOSIS — K922 Gastrointestinal hemorrhage, unspecified: Secondary | ICD-10-CM | POA: Diagnosis not present

## 2024-04-24 DIAGNOSIS — K921 Melena: Secondary | ICD-10-CM

## 2024-04-24 DIAGNOSIS — Z980 Intestinal bypass and anastomosis status: Secondary | ICD-10-CM

## 2024-04-24 DIAGNOSIS — I5030 Unspecified diastolic (congestive) heart failure: Secondary | ICD-10-CM

## 2024-04-24 DIAGNOSIS — D126 Benign neoplasm of colon, unspecified: Secondary | ICD-10-CM

## 2024-04-24 DIAGNOSIS — K5521 Angiodysplasia of colon with hemorrhage: Principal | ICD-10-CM

## 2024-04-24 DIAGNOSIS — K5520 Angiodysplasia of colon without hemorrhage: Secondary | ICD-10-CM

## 2024-04-24 DIAGNOSIS — K6389 Other specified diseases of intestine: Secondary | ICD-10-CM

## 2024-04-24 DIAGNOSIS — D649 Anemia, unspecified: Secondary | ICD-10-CM | POA: Diagnosis not present

## 2024-04-24 HISTORY — PX: COLONOSCOPY: SHX5424

## 2024-04-24 HISTORY — PX: GIVENS CAPSULE STUDY: SHX5432

## 2024-04-24 LAB — CBC WITH DIFFERENTIAL/PLATELET
Abs Immature Granulocytes: 0.01 K/uL (ref 0.00–0.07)
Abs Immature Granulocytes: 0.03 K/uL (ref 0.00–0.07)
Basophils Absolute: 0 K/uL (ref 0.0–0.1)
Basophils Absolute: 0 K/uL (ref 0.0–0.1)
Basophils Relative: 0 %
Basophils Relative: 0 %
Eosinophils Absolute: 0 K/uL (ref 0.0–0.5)
Eosinophils Absolute: 0 K/uL (ref 0.0–0.5)
Eosinophils Relative: 0 %
Eosinophils Relative: 0 %
HCT: 27.4 % — ABNORMAL LOW (ref 36.0–46.0)
HCT: 28.4 % — ABNORMAL LOW (ref 36.0–46.0)
Hemoglobin: 8.6 g/dL — ABNORMAL LOW (ref 12.0–15.0)
Hemoglobin: 9.4 g/dL — ABNORMAL LOW (ref 12.0–15.0)
Immature Granulocytes: 0 %
Immature Granulocytes: 1 %
Lymphocytes Relative: 9 %
Lymphocytes Relative: 10 %
Lymphs Abs: 0.4 K/uL — ABNORMAL LOW (ref 0.7–4.0)
Lymphs Abs: 0.5 K/uL — ABNORMAL LOW (ref 0.7–4.0)
MCH: 43.4 pg — ABNORMAL HIGH (ref 26.0–34.0)
MCH: 44.3 pg — ABNORMAL HIGH (ref 26.0–34.0)
MCHC: 31.4 g/dL (ref 30.0–36.0)
MCHC: 33.1 g/dL (ref 30.0–36.0)
MCV: 138.4 fL — ABNORMAL HIGH (ref 80.0–100.0)
MCV: 134 fL — ABNORMAL HIGH (ref 80.0–100.0)
Monocytes Absolute: 0.3 K/uL (ref 0.1–1.0)
Monocytes Absolute: 0.3 K/uL (ref 0.1–1.0)
Monocytes Relative: 9 %
Monocytes Relative: 7 %
Neutro Abs: 3.2 K/uL (ref 1.7–7.7)
Neutro Abs: 3.7 K/uL (ref 1.7–7.7)
Neutrophils Relative %: 82 %
Neutrophils Relative %: 82 %
Platelets: 128 K/uL — ABNORMAL LOW (ref 150–400)
Platelets: 143 K/uL — ABNORMAL LOW (ref 150–400)
RBC: 1.98 MIL/uL — ABNORMAL LOW (ref 3.87–5.11)
RBC: 2.12 MIL/uL — ABNORMAL LOW (ref 3.87–5.11)
RDW: 13.8 % (ref 11.5–15.5)
RDW: 13.8 % (ref 11.5–15.5)
Smear Review: NORMAL
WBC: 3.9 K/uL — ABNORMAL LOW (ref 4.0–10.5)
WBC: 4.6 K/uL (ref 4.0–10.5)
nRBC: 0 % (ref 0.0–0.2)
nRBC: 0 % (ref 0.0–0.2)

## 2024-04-24 LAB — IRON AND TIBC
Iron: 36 ug/dL (ref 28–170)
Saturation Ratios: 14 % (ref 10.4–31.8)
TIBC: 256 ug/dL (ref 250–450)
UIBC: 220 ug/dL

## 2024-04-24 LAB — BASIC METABOLIC PANEL WITH GFR
Anion gap: 13 (ref 5–15)
BUN: 8 mg/dL (ref 8–23)
CO2: 20 mmol/L — ABNORMAL LOW (ref 22–32)
Calcium: 8.9 mg/dL (ref 8.9–10.3)
Chloride: 108 mmol/L (ref 98–111)
Creatinine, Ser: 0.62 mg/dL (ref 0.44–1.00)
GFR, Estimated: >60 mL/min (ref >60)
Glucose, Bld: 98 mg/dL (ref 70–99)
Potassium: 4.1 mmol/L (ref 3.5–5.1)
Sodium: 141 mmol/L (ref 135–145)

## 2024-04-24 LAB — VITAMIN B12: Vitamin B-12: 221 pg/mL (ref 180–914)

## 2024-04-24 LAB — FERRITIN: Ferritin: 23 ng/mL (ref 11–307)

## 2024-04-24 SURGERY — COLONOSCOPY
Anesthesia: Monitor Anesthesia Care

## 2024-04-24 MED ORDER — LIDOCAINE 2% (20 MG/ML) 5 ML SYRINGE
INTRAMUSCULAR | Status: DC | PRN
  Administered 2024-04-24: 80 mg via INTRAVENOUS

## 2024-04-24 MED ORDER — PROPOFOL 10 MG/ML IV BOLUS
INTRAVENOUS | Status: AC
  Filled 2024-04-24: qty 20

## 2024-04-24 MED ORDER — SODIUM CHLORIDE 0.9 % IV SOLN: INTRAVENOUS | Status: DC

## 2024-04-24 MED ORDER — PROPOFOL 500 MG/50ML IV EMUL
INTRAVENOUS | Status: DC | PRN
  Administered 2024-04-24: 200 ug/kg/min via INTRAVENOUS
  Administered 2024-04-24: 20 mg via INTRAVENOUS
  Administered 2024-04-24: 30 mg via INTRAVENOUS
  Administered 2024-04-24: 20 mg via INTRAVENOUS

## 2024-04-24 MED ORDER — PHENYLEPHRINE 80 MCG/ML (10ML) SYRINGE FOR IV PUSH (FOR BLOOD PRESSURE SUPPORT)
PREFILLED_SYRINGE | INTRAVENOUS | Status: DC | PRN
  Administered 2024-04-24 (×2): 80 ug via INTRAVENOUS
  Administered 2024-04-24: 160 ug via INTRAVENOUS

## 2024-04-24 NOTE — Telephone Encounter (Signed)
 Inbound call from patient stating she would like to speak to someone in regards to her procedure that she had done at [facility] 8/28 Requesting a call back  Please advise  Thank you

## 2024-04-24 NOTE — Progress Notes (Signed)
 [facility] capsule endoscopy ordered by MD Danis.  Patient ingested capsule at 1343.  Per Given's capsule instructions, patient to remain NPO until 1543 at which time they may progress to clear liquid diet. At 1943 patient may have a small snack such as a half a sandwich or a bowl of soup. At 2343 patient may progress to previously ordered diet.  The capsule endoscopy study will conclude at 1:43am (04/25/24) at which time the recorder and leads or belt can be removed and placed in a patient belongings bag. Endoscopy staff will pick up the equipment in the AM.  Instructions provided to patient and inpatient RN. Patient and RN demonstrated understanding.

## 2024-04-24 NOTE — Progress Notes (Signed)
 Pt ambulatory in hallways, no distress noted, no complaints voiced at this time.

## 2024-04-24 NOTE — Anesthesia Procedure Notes (Signed)
 Procedure Name: MAC Date/Time: 04/24/2024 11:47 AM  Performed by: Franchot Delon RAMAN, CRNAPre-anesthesia Checklist: Patient identified, Emergency Drugs available, Suction available and Patient being monitored Oxygen Delivery Method: Simple face mask Placement Confirmation: positive ETCO2 Dental Injury: Teeth and Oropharynx as per pre-operative assessment

## 2024-04-24 NOTE — Plan of Care (Signed)
  Problem: Pain Managment: Goal: General experience of comfort will improve and/or be controlled Outcome: Progressing   Problem: Skin Integrity: Goal: Risk for impaired skin integrity will decrease Outcome: Progressing   Problem: Safety: Goal: Ability to remain free from injury will improve Outcome: Progressing

## 2024-04-24 NOTE — Op Note (Signed)
 [facility] Patient Name: Kristina Osborne Procedure Date: 04/24/2024 MRN: [id number] Attending MD: Victory CROME. Legrand , MD, [id number] Date of Birth: [birth year] CSN: [id number] Age: 73 Admit Type: Inpatient Procedure:                Colonoscopy Indications:              Melena, Acute post hemorrhagic anemia                           Significant history of bleeding from GI AVMs,                            treated with right hemicolectomy decades ago and a                            subsequent segmental jejunal resection.                           Further details in this admission's inpatient                            consult note.                           Patient's last dose of Plavix  was 2 days ago, and                            her bleeding has stopped during the admission as                            evidenced by clear output from bowel preparation                            and stability of hemoglobin since yesterday.                           Last hospitalization for bleeding October 2023                           No source on small bowel enteroscopy yesterday Providers:                Victory L. Legrand, MD, Willy Hummer, RN, Felice Sar,                            Technician Referring MD:             [facility] Medicines:                Monitored Anesthesia Care Complications:            No immediate complications. Estimated Blood Loss:     Estimated blood loss was minimal. Procedure:                Pre-Anesthesia Assessment:                           - Prior to the procedure, a History and Physical  was performed, and patient medications and                            allergies were reviewed. The patient's tolerance of                            previous anesthesia was also reviewed. The risks                            and benefits of the procedure and the sedation                            options and risks were discussed with  the patient.                            All questions were answered, and informed consent                            was obtained. Prior Anticoagulants: The patient has                            taken Plavix  (clopidogrel ), last dose was 2 days                            prior to procedure. ASA Grade Assessment: IV - A                            patient with severe systemic disease that is a                            constant threat to life. After reviewing the risks                            and benefits, the patient was deemed in                            satisfactory condition to undergo the procedure.                           After obtaining informed consent, the colonoscope                            was passed under direct vision. Throughout the                            procedure, the patient's blood pressure, pulse, and                            oxygen saturations were monitored continuously. The                            PCF-HQ190DL ([id number]) Olympus colonoscope was  introduced through the anus and advanced to the the                            neo-terminal ileum. The colonoscopy was somewhat                            difficult due to a redundant colon. Successful                            completion of the procedure was aided by                            straightening and shortening the scope to obtain                            bowel loop reduction. The colon was also somewhat                            spastic and it was difficult at times to maintain                            insufflation. The patient tolerated the procedure                            well. The quality of the bowel preparation was                            good. The neo-terminal ileum, the rectum and                            ileo-colonic anastomosis were photographed. Scope In: 11:55:18 AM Scope Out: 12:40:27 PM Scope Withdrawal Time: 0 hours 41 minutes 10 seconds   Total Procedure Duration: 0 hours 45 minutes 9 seconds  Findings:      The perianal and digital rectal examinations were normal.      Multiple large angioectasias with bleeding on contact were found in the       descending colon, in the transverse colon and in the ascending colon.       These angioectatic vessels were most confluent and prominent in the area       of the anastomosis. There was no active bleeding nor any fresh/old blood       seen anywhere in the colon during scope advancement. After advancing the       scope to the neoterminal ileum and then withdrawn back to the       anastomosis, there was active bleeding from an diminutive area of       disrupted mucosa at the anastomosis. Coagulation for hemostasis using       argon plasma was successful but did not completely control the bleeding.       For hemostasis, a hemostatic clip was successfully placed (MR       conditional). This controlled the bleeding.      A 15 mm polyp was found just on the colonic side of the anastomosis. The       polyp was sessile and examined under both  white light and NBI. It was       neither biopsied nor removed since last Plavix  dose was 2 days ago, and       a post polypectomy bleeding were to occur, it would confuse the acute GI       bleeding picture of this hospitalization.      There were a few other small areas of mucosal bleeding from friability       and scope trauma that occurred during insertion due to redundancy and       scope looping. Most were not actively bleeding when seen during scope       withdrawal, but one of the transverse colon was slowly oozing. This       appeared to be a diminutive mucosal tear rather than an AVM. It was       treated with a single Hemoclip and further observation, and the bleeding       stopped. Hemostatic gel was also applied to decrease the chance of       recurrent bleeding from that area. Some areas of the colonic mucosa       appeared edematous.       The exam was otherwise without abnormality. Retroflexion not performed       in the rectum so was not to cause mucosal trauma and bleeding.      The bleeding seen and treated on this exam as described above all       appears to have been caused by scope trauma on friable mucosa with       underlying diffuse angio ectatic vasculature. As such, it is not clear       if there was initially spontaneous bleeding from any of these areas that       caused the initial clinical picture leading to hospitalization. There is       still a possibility of a small bowel source, as was a concern during the       2023 hospitalization. Impression:               - Multiple colonic angioectasias. Treated with                            argon plasma coagulation (APC). Clips (MR                            conditional) were placed.                           - One 15 mm polyp at the anastomosis.                           - The examination was otherwise normal.                           - No specimens collected. Moderate Sedation:      MAC sedation used Recommendation:           - Return patient to hospital ward for ongoing care.                           - Video capsule study today (patient will swallow  the capsule in the endoscopy post procedure area                            after recovering from anesthesia.                           She was agreeable to that plan and discussions                            yesterday and again before today's procedure.                           Continue to hold Plavix .                           If there is not a definite bleeding source seen on                            small bowel video capsule study, then it would                            appear that spontaneous bleeding of friable mucosa                            in the area of angioectasias in the right colon was                            the most likely source.                            As such, dialogue needs to be opened with this                            patient's primary and interventional cardiologists                            about the need for long-term Plavix  use after                            having undergone a mitral valve clip procedure                            several months ago.                           The colon polyp found near the anastomosis will                            need to be revisited at a later time when the                            patient can be off Plavix  for a longer period.  Cannot tell with certainty from its appearance if                            it is adenomatous or inflammatory. (Though I favor                            the latter given its appearance and location) Procedure Code(s):        --- Professional ---                           [phone], Colonoscopy, flexible; with control of                            bleeding, any method Diagnosis Code(s):        --- Professional ---                           K55.20, Angiodysplasia of colon without hemorrhage                           D12.6, Benign neoplasm of colon, unspecified                           K92.1, Melena (includes Hematochezia)                           D62, Acute posthemorrhagic anemia CPT copyright 2022 American Medical Association. All rights reserved. The codes documented in this report are preliminary and upon coder review may  be revised to meet current compliance requirements. Rorik Vespa L. Legrand, MD 04/24/2024 1:09:16 PM This report has been signed electronically. Number of Addenda: 0

## 2024-04-24 NOTE — Interval H&P Note (Signed)
 History and Physical Interval Note:  04/24/2024 11:39 AM  Kristina Osborne  has presented today for surgery, with the diagnosis of GI bleed, anemia.  The various methods of treatment have been discussed with the patient and family. After consideration of risks, benefits and other options for treatment, the patient has consented to  Procedure(s): COLONOSCOPY (N/A) as a surgical intervention.  The patient's history has been reviewed, patient examined, no change in status, stable for surgery.  I have reviewed the patient's chart and labs.  Questions were answered to the patient's satisfaction.     Victory LITTIE Brand III

## 2024-04-24 NOTE — Progress Notes (Addendum)
 [facility] Progress Note  CC:   GI bleed, melena, anemia     Subjective: She completed both doses of Suprep and is passing clear yellow water per the rectum overnight and this morning.  No nausea or vomiting.  No abdominal pain.  No chest pain or shortness of breath.  She continues to feel lightheaded when ambulating.   Objective:   Small bowel enteroscopy 04/23/2024: - Normal stomach.  - Normal examined duodenum.  - A tattoo was seen in the jejunum. It as placed during the inpatient SBE in 2023.  - Jejunal polyp. Benign-appearing. This looks like slightly polypoid normal small bowel mucosa. - No specimens collected.  Vital signs in last 24 hours: Temp:  [97.3 F (36.3 C)-98.5 F (36.9 C)] 98.5 F (36.9 C) (08/28 0450) Pulse Rate:  [61-88] 65 (08/28 0450) Resp:  [13-27] 18 (08/28 0450) BP: (80-101)/(35-69) 90/49 (08/28 0450) SpO2:  [99 %-100 %] 99 % (08/28 0450) Weight:  [64.9 kg] 64.9 kg (08/27 1344) Last BM Date : 04/23/24 General: Alert 73 year old female in no acute distress. Heart: RRR, no murmurs.  Pulm: Breath sounds clear throughout.  Abdomen: Soft, nontender. Nondistended. Positive bowel sounds x 4 quadrants.  Extremities: No lower extremity edema. Neurologic:  Alert and oriented x 4. Grossly normal neurologically. Psych:  Alert and cooperative. Normal mood and affect.  Intake/Output from previous day: 08/27 0701 - 08/28 0700 In: 4297.8 [P.O.:2700; I.V.:1347.8; IV Piggyback:250] Out: -  Intake/Output this shift: No intake/output data recorded.  Lab Results: Recent Labs    04/23/24 0537 04/23/24 1735 04/24/24 0619  WBC 3.0* 3.2* 3.9*  HGB 8.9* 8.6* 8.6*  HCT 28.2* 27.1* 27.4*  PLT 138* 143* 128*   BMET Recent Labs    04/22/24 1400 04/23/24 0537 04/24/24 0619  NA 139 141 141  K 3.9 3.9 4.1  CL 106 106 108  CO2 28 23 20*  GLUCOSE 96 95 98  BUN 14 12 8   CREATININE 0.67 0.68 0.62  CALCIUM  9.5 9.6 8.9   LFT Recent Labs     04/22/24 1400  PROT 6.3*  ALBUMIN  4.0  AST 22  ALT 14  ALKPHOS 50  BILITOT 0.6   PT/INR No results for input(s): LABPROT, INR in the last 72 hours. Hepatitis Panel No results for input(s): HEPBSAG, HCVAB, HEPAIGM, HEPBIGM in the last 72 hours.  DG Chest Portable 1 View Result Date: 04/22/2024 CLINICAL DATA:  Chest pain. Decreased blood counts over the last week. EXAM: PORTABLE CHEST 1 VIEW COMPARISON:  02/07/2024 FINDINGS: Postoperative changes in the mediastinum. Calcification in the mitral valve annulus. Heart size and pulmonary vascularity are normal for technique. Lungs are clear. No pleural effusion or pneumothorax. Mediastinal contours appear intact. IMPRESSION: No active disease. Electronically Signed   By: Elsie Gravely M.D.   On: 04/22/2024 18:10   Patient Profile: Kristina Osborne is a 73 y.o. 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   Assessment / Plan:  73 year old female with a history of recurrent GI bleed secondary to small bowel and colon AVMs sp right hemicolectomy and terminal ileal resection for  bleeding AVMs in 1995 and subsequently underwent segmental jejunal resection for bleeding AVMs admitted 04/22/2024 with melena x 3 days and symptomatic anemia. On Plavix , last dose was 8/45 AM on 8/26. Enteroscopy 8/27 showed a normal stomach and duodenum, a tattoo was seen in the jejunum and a benign-appearing jejunal polyp without evidence of active bleeding.  Colonoscopy scheduled today. BP 90's/40 - 50s. HR 65 - 80s. - NPO   - Patient to proceed with colonoscopy this afternoon - IV fluids per the hospitalist  - Continue to hold  Plavix   - Continue Pantoprazole  40mg  IV QD - CBC in AM - Transfuse for Hg level < 8 and as needed if symptomatic    Acute on chronic IDA secondary to GI blood loss. Admission Hg 9.6  (Hg 12.4 on 8/14) -> 8.9 -> today Hg 8.6. CBC consistent with pancytopenia. WBC 3.0 -> 3.9. RBC 2.0 -> 1.98. Hg 8.9 -> 8.6. PLT 138 -> 128. - See plan above - Iron , TIBC and ferritin  - Consider IV iron  infusion during this hospital admission  - Recommending holding Hydroxyurea  (dose recently increased per hematology, Hydroxyurea  can cause myelosuppression) - Consider hematology consult   Thrombocytosis with JAK 2 mutation on Hydroxyurea . Admission PLT  177 -> 138 -> 143 -> 128.  -See recommendations above   Macrocytosis, likely due to Hydroxyurea . MCV 135.5 -> 138.4 -B12 level, add on to am lab draw   History of severe MR s/p percutaneous MVR with Mitra clip 11/2023 on Plavix . Last dose of Plavix  was 8:45 AM on 8/26.  -Continue to hold Plavix  -Challenging situation regarding chronic antiplatelet therapy use in setting or recurrent GI bleeding   Chronic HFpEF, LVEF 60 - 65 % per ECHO 12/2023   History of colon polyps. Her most recent colonoscopy 05/2022 showed 2 hyperplastic polyps removed from the colon. -No further colon polyp surveillance colonoscopies recommended    Principal Problem:   Lower GI bleed Active Problems:   Acute blood loss anemia   Melena     LOS: 2 days   Kristina Osborne  04/24/2024, 9: 33 AM   I have taken an interval history, thoroughly reviewed the chart and examined the patient. I agree with the Advanced Practitioner's note, impression and recommendations, and have recorded additional findings, impressions and recommendations below. Patient was seen in the endoscopy pre-procedure area  My additional thoughts are as follows:  Agree with calling Hematology  during this admission about the hydroxyurea  dosing , considering her recent CBC changes.  I will also reach out to this patient's general and interventional cardiologists about aspirin vs plavix  in the setting of mitral valve clip months ago. Depends on colonoscopy (and possibly video capsule) findings, but we will be in a tough spot if no clear GI source is found that is amenable to intervention. _________________    Victory LITTIE Brand III Office:[phone]

## 2024-04-24 NOTE — Anesthesia Preprocedure Evaluation (Addendum)
 Anesthesia Evaluation  Patient identified by MRN, date of birth, ID band Patient awake    Reviewed: Allergy & Precautions, NPO status , Patient's Chart, lab work & pertinent test results  Airway Mallampati: II       Dental no notable dental hx.    Pulmonary former smoker   Pulmonary exam normal        Cardiovascular pulmonary hypertension+CHF  Normal cardiovascular exam+ Valvular Problems/Murmurs MVP      Neuro/Psych  Headaches  Neuromuscular disease    GI/Hepatic   Endo/Other    Renal/GU      Musculoskeletal   Abdominal   Peds  Hematology  (+) Blood dyscrasia (Plavix ), anemia   Anesthesia Other Findings GI bleed  Anemia  Reproductive/Obstetrics                              Anesthesia Physical Anesthesia Plan  ASA: 3  Anesthesia Plan: MAC   Post-op Pain Management:    Induction:   PONV Risk Score and Plan: 2 and Propofol  infusion and Treatment may vary due to age or medical condition  Airway Management Planned: Simple Face Mask  Additional Equipment:   Intra-op Plan:   Post-operative Plan:   Informed Consent: I have reviewed the patients History and Physical, chart, labs and discussed the procedure including the risks, benefits and alternatives for the proposed anesthesia with the patient or authorized representative who has indicated his/her understanding and acceptance.     Dental advisory given  Plan Discussed with: CRNA  Anesthesia Plan Comments:          Anesthesia Quick Evaluation

## 2024-04-24 NOTE — Transfer of Care (Signed)
 Immediate Anesthesia Transfer of Care Note  Patient: Kristina Osborne  Procedure(s) Performed: COLONOSCOPY  Patient Location: PACU Endo  Anesthesia Type:MAC  Level of Consciousness: drowsy and responds to stimulation  Airway & Oxygen Therapy: Patient Spontanous Breathing and Patient connected to face mask oxygen  Post-op Assessment: Report given to RN and Post -op Vital signs reviewed and stable  Post vital signs: Reviewed and stable  Last Vitals:  Vitals Value Taken Time  BP 95/43 04/24/24 12:46  Temp    Pulse 68 04/24/24 12:47  Resp 16 04/24/24 12:47  SpO2 100 % 04/24/24 12:47  Vitals shown include unfiled device data.  Last Pain:  Vitals:   04/24/24 1050  TempSrc: Temporal  PainSc: 0-No pain         Complications: No notable events documented.

## 2024-04-24 NOTE — Progress Notes (Signed)
 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  She was also noticing fatigue and lightheadedness.  In the emergency room hemodynamically stable.  Hemoglobin 9.5.  Melanotic occult blood positive stool.  Recent hemoglobin was 12 lower was 10 days ago.  Admitted with GI consultation.  Subjective: Patient seen and examined before going for colonoscopy.  No overnight events.  She was still feeling dizzy on standing.  Appropriately anxious about everything going on, unable to have quality of life with recurrent hospitalization. 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  Hemoglobin is stable so far.   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  Symptomatic anemia. IV fluids, n.p.o. except meds.  Plavix  on hold.  IV PPI daily. Upper GI endoscopy, no source of bleeding. Colonoscopy and enteroscopy today with multiple telangiectasia, touch to bleed lesions there were cauterized.  Plan for small bowel capsule endoscopy today. Closely monitor hemoglobin every 12 hours.  Currently no indication for blood transfusion.  Will recheck levels in the evening.  Iron  levels are adequate.  Essential thrombocytosis: Patient on hydroxyurea .  Now with pancytopenia.  Holding hydroxyurea .  Discussed with hematology.  Mitral valve prolapse status post MitraClip: Patient needed to be on lifelong anticoagulation, Plavix  preferred.  Currently on hold.  No indication to bridge until procedure.    DVT prophylaxis: SCDs Start: 04/22/24 1828   Code  Status: Full code Family Communication: None at the bedside Disposition Plan: Status is: Inpatient Remains inpatient appropriate because: IV fluids, inpatient procedures     Consultants:  Gastroenterology Hematology, curbside  Procedures:  None  Antimicrobials:  None     Objective: Vitals:   04/24/24 1252 04/24/24 1255 04/24/24 1300 04/24/24 1302  BP: (!) 99/41 (!) 74/29 (!) 86/41 (!) 83/38  Pulse: 84 73 82 67  Resp: (!) 24 (!) 22 (!) 21 17  Temp: (!) 97.1 F (36.2 C)     TempSrc: Temporal     SpO2: 100% 99% 95% 99%  Weight:      Height:        Intake/Output Summary (Last 24 hours) at 04/24/2024 1326 Last data filed at 04/24/2024 1247 Gross per 24 hour  Intake 5397.79 ml  Output 10 ml  Net 5387.79 ml   Filed Weights   04/22/24 1454 04/23/24 1344  Weight: 64.9 kg 64.9 kg    Examination:  General exam: Appears calm and comfortable.  Appropriately anxious. Respiratory system: Clear to auscultation. Respiratory effort normal. Cardiovascular system: S1 & S2 heard, RRR. No JVD, murmurs, rubs, gallops or clicks. No pedal edema. Gastrointestinal system: Abdomen is nondistended, soft and nontender. No organomegaly or masses felt. Normal bowel sounds heard. Central nervous system: Alert and oriented. No focal neurological deficits. Extremities: Symmetric 5 x 5 power.    Data Reviewed: I have personally reviewed following labs and imaging studies  CBC: Recent Labs  Lab 04/22/24 1400 04/22/24 1855 04/23/24 0537 04/23/24 1735 04/24/24 0619  WBC 4.9  --  3.0* 3.2* 3.9*  NEUTROABS 3.8  --   --  2.4 3.2  HGB 9.5* 9.6* 8.9*  8.6* 8.6*  HCT 27.8* 29.5* 28.2* 27.1* 27.4*  MCV 129.3*  --  135.6* 135.5* 138.4*  PLT 177  --  138* 143* 128*   Basic Metabolic Panel: Recent Labs  Lab 04/22/24 1400 04/23/24 0537 04/24/24 0619  NA 139 141 141  K 3.9 3.9 4.1  CL 106 106 108  CO2 28 23 20*  GLUCOSE 96 95 98  BUN 14 12 8   CREATININE 0.67 0.68 0.62  CALCIUM  9.5  9.6 8.9   GFR: Estimated Creatinine Clearance: 59.5 mL/min (by C-G formula based on SCr of 0.62 mg/dL). Liver Function Tests: Recent Labs  Lab 04/22/24 1400  AST 22  ALT 14  ALKPHOS 50  BILITOT 0.6  PROT 6.3*  ALBUMIN  4.0   No results for input(s): LIPASE, AMYLASE in the last 168 hours. No results for input(s): AMMONIA in the last 168 hours. Coagulation Profile: No results for input(s): INR, PROTIME in the last 168 hours. Cardiac Enzymes: No results for input(s): CKTOTAL, CKMB, CKMBINDEX, TROPONINI in the last 168 hours. BNP (last 3 results) Recent Labs    11/26/23 1148  PROBNP 474*   HbA1C: No results for input(s): HGBA1C in the last 72 hours. CBG: No results for input(s): GLUCAP in the last 168 hours. Lipid Profile: No results for input(s): CHOL, HDL, LDLCALC, TRIG, CHOLHDL, LDLDIRECT in the last 72 hours. Thyroid Function Tests: No results for input(s): TSH, T4TOTAL, FREET4, T3FREE, THYROIDAB in the last 72 hours. Anemia Panel: Recent Labs    04/22/24 1358  FERRITIN 37   Sepsis Labs: No results for input(s): PROCALCITON, LATICACIDVEN in the last 168 hours.  No results found for this or any previous visit (from the past 240 hours).       Radiology Studies: DG Chest Portable 1 View Result Date: 04/22/2024 CLINICAL DATA:  Chest pain. Decreased blood counts over the last week. EXAM: PORTABLE CHEST 1 VIEW COMPARISON:  02/07/2024 FINDINGS: Postoperative changes in the mediastinum. Calcification in the mitral valve annulus. Heart size and pulmonary vascularity are normal for technique. Lungs are clear. No pleural effusion or pneumothorax. Mediastinal contours appear intact. IMPRESSION: No active disease. Electronically Signed   By: Elsie Gravely M.D.   On: 04/22/2024 18:10        Scheduled Meds:  [MAR Hold] pantoprazole  (PROTONIX ) IV  40 mg Intravenous Q24H   Continuous Infusions:  sodium chloride  20 mL/hr  at 04/24/24 0200   sodium chloride        LOS: 2 days    Time spent: 40 minutes    Renato Applebaum, MD [facility]

## 2024-04-24 NOTE — H&P (View-Only) (Signed)
 [facility] Progress Note  CC:   GI bleed, melena, anemia     Subjective: She completed both doses of Suprep and is passing clear yellow water per the rectum overnight and this morning.  No nausea or vomiting.  No abdominal pain.  No chest pain or shortness of breath.  She continues to feel lightheaded when ambulating.   Objective:   Small bowel enteroscopy 04/23/2024: - Normal stomach.  - Normal examined duodenum.  - A tattoo was seen in the jejunum. It as placed during the inpatient SBE in 2023.  - Jejunal polyp. Benign-appearing. This looks like slightly polypoid normal small bowel mucosa. - No specimens collected.  Vital signs in last 24 hours: Temp:  [97.3 F (36.3 C)-98.5 F (36.9 C)] 98.5 F (36.9 C) (08/28 0450) Pulse Rate:  [61-88] 65 (08/28 0450) Resp:  [13-27] 18 (08/28 0450) BP: (80-101)/(35-69) 90/49 (08/28 0450) SpO2:  [99 %-100 %] 99 % (08/28 0450) Weight:  [64.9 kg] 64.9 kg (08/27 1344) Last BM Date : 04/23/24 General: Alert 73 year old female in no acute distress. Heart: RRR, no murmurs.  Pulm: Breath sounds clear throughout.  Abdomen: Soft, nontender. Nondistended. Positive bowel sounds x 4 quadrants.  Extremities: No lower extremity edema. Neurologic:  Alert and oriented x 4. Grossly normal neurologically. Psych:  Alert and cooperative. Normal mood and affect.  Intake/Output from previous day: 08/27 0701 - 08/28 0700 In: 4297.8 [P.O.:2700; I.V.:1347.8; IV Piggyback:250] Out: -  Intake/Output this shift: No intake/output data recorded.  Lab Results: Recent Labs    04/23/24 0537 04/23/24 1735 04/24/24 0619  WBC 3.0* 3.2* 3.9*  HGB 8.9* 8.6* 8.6*  HCT 28.2* 27.1* 27.4*  PLT 138* 143* 128*   BMET Recent Labs    04/22/24 1400 04/23/24 0537 04/24/24 0619  NA 139 141 141  K 3.9 3.9 4.1  CL 106 106 108  CO2 28 23 20*  GLUCOSE 96 95 98  BUN 14 12 8   CREATININE 0.67 0.68 0.62  CALCIUM  9.5 9.6 8.9   LFT Recent Labs     04/22/24 1400  PROT 6.3*  ALBUMIN  4.0  AST 22  ALT 14  ALKPHOS 50  BILITOT 0.6   PT/INR No results for input(s): LABPROT, INR in the last 72 hours. Hepatitis Panel No results for input(s): HEPBSAG, HCVAB, HEPAIGM, HEPBIGM in the last 72 hours.  DG Chest Portable 1 View Result Date: 04/22/2024 CLINICAL DATA:  Chest pain. Decreased blood counts over the last week. EXAM: PORTABLE CHEST 1 VIEW COMPARISON:  02/07/2024 FINDINGS: Postoperative changes in the mediastinum. Calcification in the mitral valve annulus. Heart size and pulmonary vascularity are normal for technique. Lungs are clear. No pleural effusion or pneumothorax. Mediastinal contours appear intact. IMPRESSION: No active disease. Electronically Signed   By: Elsie Gravely M.D.   On: 04/22/2024 18:10   Patient Profile: Kristina Osborne is a 73 y.o. 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   Assessment / Plan:  73 year old female with a history of recurrent GI bleed secondary to small bowel and colon AVMs sp right hemicolectomy and terminal ileal resection for  bleeding AVMs in 1995 and subsequently underwent segmental jejunal resection for bleeding AVMs admitted 04/22/2024 with melena x 3 days and symptomatic anemia. On Plavix , last dose was 8/45 AM on 8/26. Enteroscopy 8/27 showed a normal stomach and duodenum, a tattoo was seen in the jejunum and a benign-appearing jejunal polyp without evidence of active bleeding.  Colonoscopy scheduled today. BP 90's/40 - 50s. HR 65 - 80s. - NPO   - Patient to proceed with colonoscopy this afternoon - IV fluids per the hospitalist  - Continue to hold  Plavix   - Continue Pantoprazole  40mg  IV QD - CBC in AM - Transfuse for Hg level < 8 and as needed if symptomatic    Acute on chronic IDA secondary to GI blood loss. Admission Hg 9.6  (Hg 12.4 on 8/14) -> 8.9 -> today Hg 8.6. CBC consistent with pancytopenia. WBC 3.0 -> 3.9. RBC 2.0 -> 1.98. Hg 8.9 -> 8.6. PLT 138 -> 128. - See plan above - Iron , TIBC and ferritin  - Consider IV iron  infusion during this hospital admission  - Recommending holding Hydroxyurea  (dose recently increased per hematology, Hydroxyurea  can cause myelosuppression) - Consider hematology consult   Thrombocytosis with JAK 2 mutation on Hydroxyurea . Admission PLT  177 -> 138 -> 143 -> 128.  -See recommendations above   Macrocytosis, likely due to Hydroxyurea . MCV 135.5 -> 138.4 -B12 level, add on to am lab draw   History of severe MR s/p percutaneous MVR with Mitra clip 11/2023 on Plavix . Last dose of Plavix  was 8:45 AM on 8/26.  -Continue to hold Plavix  -Challenging situation regarding chronic antiplatelet therapy use in setting or recurrent GI bleeding   Chronic HFpEF, LVEF 60 - 65 % per ECHO 12/2023   History of colon polyps. Her most recent colonoscopy 05/2022 showed 2 hyperplastic polyps removed from the colon. -No further colon polyp surveillance colonoscopies recommended    Principal Problem:   Lower GI bleed Active Problems:   Acute blood loss anemia   Melena     LOS: 2 days   [name]  04/24/2024, 9: 33 AM   I have taken an interval history, thoroughly reviewed the chart and examined the patient. I agree with the Advanced Practitioner's note, impression and recommendations, and have recorded additional findings, impressions and recommendations below. Patient was seen in the endoscopy pre-procedure area  My additional thoughts are as follows:  Agree with calling Hematology  during this admission about the hydroxyurea  dosing , considering her recent CBC changes.  I will also reach out to this patient's general and interventional cardiologists about aspirin vs plavix  in the setting of mitral valve clip months ago. Depends on colonoscopy (and possibly video capsule) findings, but we will be in a tough spot if no clear GI source is found that is amenable to intervention. _________________    Victory LITTIE Brand III Office:[phone]

## 2024-04-25 ENCOUNTER — Telehealth: Payer: Self-pay | Admitting: Physician Assistant

## 2024-04-25 ENCOUNTER — Encounter (HOSPITAL_COMMUNITY): Payer: Self-pay | Admitting: Gastroenterology

## 2024-04-25 DIAGNOSIS — K5521 Angiodysplasia of colon with hemorrhage: Secondary | ICD-10-CM

## 2024-04-25 DIAGNOSIS — K922 Gastrointestinal hemorrhage, unspecified: Secondary | ICD-10-CM | POA: Diagnosis not present

## 2024-04-25 LAB — CBC WITH DIFFERENTIAL/PLATELET
Abs Immature Granulocytes: 0.02 K/uL (ref 0.00–0.07)
Abs Immature Granulocytes: 0.02 K/uL (ref 0.00–0.07)
Basophils Absolute: 0 K/uL (ref 0.0–0.1)
Basophils Absolute: 0 K/uL (ref 0.0–0.1)
Basophils Relative: 0 %
Basophils Relative: 0 %
Eosinophils Absolute: 0 K/uL (ref 0.0–0.5)
Eosinophils Absolute: 0 K/uL (ref 0.0–0.5)
Eosinophils Relative: 0 %
Eosinophils Relative: 0 %
HCT: 25.4 % — ABNORMAL LOW (ref 36.0–46.0)
HCT: 29 % — ABNORMAL LOW (ref 36.0–46.0)
Hemoglobin: 8 g/dL — ABNORMAL LOW (ref 12.0–15.0)
Hemoglobin: 9.5 g/dL — ABNORMAL LOW (ref 12.0–15.0)
Immature Granulocytes: 1 %
Immature Granulocytes: 0 %
Lymphocytes Relative: 11 %
Lymphocytes Relative: 11 %
Lymphs Abs: 0.4 K/uL — ABNORMAL LOW (ref 0.7–4.0)
Lymphs Abs: 0.6 K/uL — ABNORMAL LOW (ref 0.7–4.0)
MCH: 43.5 pg — ABNORMAL HIGH (ref 26.0–34.0)
MCH: 41.1 pg — ABNORMAL HIGH (ref 26.0–34.0)
MCHC: 31.5 g/dL (ref 30.0–36.0)
MCHC: 32.8 g/dL (ref 30.0–36.0)
MCV: 138 fL — ABNORMAL HIGH (ref 80.0–100.0)
MCV: 125.5 fL — ABNORMAL HIGH (ref 80.0–100.0)
Monocytes Absolute: 0.3 K/uL (ref 0.1–1.0)
Monocytes Absolute: 0.4 K/uL (ref 0.1–1.0)
Monocytes Relative: 8 %
Monocytes Relative: 8 %
Neutro Abs: 3 K/uL (ref 1.7–7.7)
Neutro Abs: 4.2 K/uL (ref 1.7–7.7)
Neutrophils Relative %: 80 %
Neutrophils Relative %: 81 %
Platelets: 130 K/uL — ABNORMAL LOW (ref 150–400)
Platelets: 156 K/uL (ref 150–400)
RBC: 1.84 MIL/uL — ABNORMAL LOW (ref 3.87–5.11)
RBC: 2.31 MIL/uL — ABNORMAL LOW (ref 3.87–5.11)
RDW: 13.9 % (ref 11.5–15.5)
Smear Review: NORMAL
Smear Review: NORMAL
WBC: 3.8 K/uL — ABNORMAL LOW (ref 4.0–10.5)
WBC: 5.2 K/uL (ref 4.0–10.5)
nRBC: 0 % (ref 0.0–0.2)
nRBC: 0 % (ref 0.0–0.2)

## 2024-04-25 LAB — PREPARE RBC (CROSSMATCH)

## 2024-04-25 MED ORDER — PANTOPRAZOLE SODIUM 40 MG PO TBEC
40.0000 mg | DELAYED_RELEASE_TABLET | Freq: Every day | ORAL | Status: DC
  Administered 2024-04-25 – 2024-04-26 (×2): 40 mg via ORAL
  Filled 2024-04-25 (×2): qty 1

## 2024-04-25 MED ORDER — SODIUM CHLORIDE 0.9% IV SOLUTION: Freq: Once | INTRAVENOUS | Status: AC

## 2024-04-25 MED ORDER — IRON SUCROSE 200 MG IVPB - SIMPLE MED
200.0000 mg | Freq: Once | Status: AC
  Administered 2024-04-25: 200 mg via INTRAVENOUS
  Filled 2024-04-25: qty 200

## 2024-04-25 NOTE — Plan of Care (Signed)
  Problem: Education: Goal: Knowledge of General Education information will improve Description: Including pain rating scale, medication(s)/side effects and non-pharmacologic comfort measures Outcome: Progressing   Problem: Clinical Measurements: Goal: Diagnostic test results will improve Outcome: Progressing   Problem: Nutrition: Goal: Adequate nutrition will be maintained Outcome: Progressing   Problem: Coping: Goal: Level of anxiety will decrease Outcome: Progressing   Problem: Pain Managment: Goal: General experience of comfort will improve and/or be controlled Outcome: Progressing

## 2024-04-25 NOTE — Plan of Care (Signed)

## 2024-04-25 NOTE — Progress Notes (Signed)
 Endoscopy capsule belt and recorder removed, placed in belongings bag and left at bedside.  Patient requests a sandwich.  Call bell in reach, no complaints voiced.

## 2024-04-25 NOTE — Progress Notes (Addendum)
 [facility] Progress Note  CC:  GI bleed, melena, anemia   Subjective: She passed 1 red bloody stool yesterday s/p colonoscopy then 3 nonbloody beige stools since then, last BM was one hour ago. No abdominal pain. No N/V. No CP or SOB. She continues to feel slightly lightheaded when ambulating. She is tolerating a regular diet.   Objective:   Small bowel enteroscopy 04/23/2024: - Normal stomach.  - Normal examined duodenum.  - A tattoo was seen in the jejunum. It as placed during the inpatient SBE in 2023.  - Jejunal polyp. Benign-appearing. This looks like slightly polypoid normal small bowel mucosa. - No specimens collected.  Colonoscopy 04/24/2024:  - Multiple colonic angioectasias. Treated with argon plasma coagulation (APC). Clips (MR conditional) were placed.  - One 15 mm polyp at the anastomosis, not removed  - The examination was otherwise normal. - No specimens collected.  Vital signs in last 24 hours: Temp:  [97.1 F (36.2 C)-97.9 F (36.6 C)] 97.7 F (36.5 C) (08/29 0452) Pulse Rate:  [60-84] 70 (08/29 0452) Resp:  [12-24] 16 (08/29 0452) BP: (74-104)/(29-60) 99/55 (08/29 0452) SpO2:  [95 %-100 %] 97 % (08/29 0452) Last BM Date : 04/25/24 (per pt) General: Alert 73 year old female in no acute distress. Heart: Regular rate and rhythm, no murmurs. Pulm: Breath sounds clear throughout. Abdomen: Soft, nondistended. Nontender. Positive bowel sounds to all 4 quadrants. Extremities: No lower extremity edema. Neurologic:  Alert and oriented x 4. Moves all extremities equally. Speech is clear. Psych:  Alert and cooperative. Normal mood and affect.  Intake/Output from previous day: 08/28 0701 - 08/29 0700 In: 2525.6 [I.V.:2525.6] Out: 10 [Blood:10] Intake/Output this shift: No intake/output data recorded.  Lab Results: Recent Labs    04/24/24 0619 04/24/24 1702 04/25/24 0526  WBC 3.9* 4.6 3.8*  HGB 8.6* 9.4* 8.0*  HCT 27.4* 28.4* 25.4*   PLT 128* 143* 130*   BMET Recent Labs    04/22/24 1400 04/23/24 0537 04/24/24 0619  NA 139 141 141  K 3.9 3.9 4.1  CL 106 106 108  CO2 28 23 20*  GLUCOSE 96 95 98  BUN 14 12 8   CREATININE 0.67 0.68 0.62  CALCIUM  9.5 9.6 8.9   LFT Recent Labs    04/22/24 1400  PROT 6.3*  ALBUMIN  4.0  AST 22  ALT 14  ALKPHOS 50  BILITOT 0.6   PT/INR No results for input(s): LABPROT, INR in the last 72 hours. Hepatitis Panel No results for input(s): HEPBSAG, HCVAB, HEPAIGM, HEPBIGM in the last 72 hours.  No results found.  Patient Profile: Kristina Osborne is a 73 y.o. 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   Assessment / Plan:  73 year old female with a history of recurrent GI bleed secondary to small bowel and colon AVMs s/p right hemicolectomy and terminal ileal resection for bleeding AVMs in 1995 and subsequently underwent segmental jejunal resection. Admitted 04/22/2024 with melena x 3 days and symptomatic anemia. On Plavix , last dose was 8/45 AM on 8/26. Enteroscopy 8/27 showed a normal stomach and duodenum, a tattoo was seen in the jejunum and  a benign-appearing jejunal polyp without evidence of active bleeding. Colonoscopy 8/28 showed multiple colonic angioectasias treated with APC and clips placed, friable mucosa in the right colon and one 15mm polyp at the anastomosis, was not removed. Small bowel capsule endoscopy 8/28 completed, results pending. Hg 8.0, one unit of PRBCs ordered per the hospitalist. BP 90/56. HR 60s.  - Check H/H post transfusion.   - Await small bowel capsule endoscopy results  - Heart healthy diet - Continue to hold Plavix   - Switch Pantoprazole  40mg  IV to po once daily    Acute on chronic IDA secondary to GI blood loss. Admission Hg 9.6  (Hg 12.4 on 8/14) -> 8.9 -> today Hg 8.6. CBC consistent with pancytopenia. WBC 3.0 -> 3.9 -> 3.8. RBC 2.0 -> 1.98 -> 1.84. Hg 8.9 -> 8.6 -> 9.4 -> today Hg 8.0. One unit of PRBCs ordered per the hospitalist. PLT 138 -> 128 -> 143 -> 130. Iron  36. TIBC 256. Ferritin 23. B12 level 221.  - See plan above - Recommending holding Hydroxyurea  (dose recently increased per hematology, Hydroxyurea  can cause myelosuppression) - Consider hematology consult - Recommend B12 supplementation for low normal B12   Thrombocytosis with JAK 2 mutation on Hydroxyurea . Admission PLT  177 -> 138 -> 143 -> 128 -> 130.  -See recommendations above    Macrocytosis, likely due to Hydroxyurea . MCV 135.5 -> 138.4 - See recommendations above    History of severe MR s/p percutaneous MVR with Mitra clip 11/2023 on Plavix . Last dose of Plavix  was 8:45 AM on 8/26.  -Continue to hold Plavix  -Challenging situation regarding chronic antiplatelet therapy use in setting or recurrent GI bleeding -Plavix  will be discontinued per discussion per Dr. Raenelle and cardiologist Dr. Wendel   Chronic HFpEF, LVEF 60 - 65 % per ECHO 12/2023   History of colon polyps. Her most recent colonoscopy 05/2022 showed 2 hyperplastic polyps removed from the colon. Colonoscopy 04/25/2024 identified a 15mm polyp at the anastomosis which was not removed, to consider a future repeat colonoscopy as an outpatient when patient is off Plavix  for an extended period of time.  -No further colon polyp surveillance colonoscopies recommended     Principal Problem:   Lower GI bleed Active Problems:   AVM (arteriovenous malformation) of colon   Acute blood loss anemia   Melena     LOS: 3 days   Kristina Osborne  04/25/2024, 10:48 AM  I  have taken an interval history, thoroughly reviewed the chart and examined the patient. I agree with the Advanced Practitioner's note, impression and recommendations, and have recorded additional findings, impressions and recommendations below. I performed a substantive portion of this encounter (>50% time spent), including a complete performance of the medical decision making.  My additional thoughts are as follows: She reports feeling better today with less lightheadedness and ability to ambulate the halls.  Feeling better after getting unit of blood as well.  She had passage of some of the procedure related bleeding and then further nonbloody bowel movements afterward.  No abdominal pain, tolerating regular diet.  Capsule study was good quality, showed a jejunal jejunal anastomosis with some staples, no source of bleeding seen there.  There were some more distal nonbleeding diminutive small bowel AVMs.  Capsule was seen entering the colon.  Small bowel AVMs most likely source of bleeding given the overall clinical scenario.  Bleeding was normal certainly exacerbated by Plavix  therapy.  Much thanks to hospitalist service for contacting cardiology and having the discussion  that led to permanent discontinuation of Plavix .  Kristina Osborne is relieved to know that and feels hopeful that the bleeding may either not return or perhaps remain low-grade and microscopic amenable to outpatient close monitoring and iron  therapy.  She will be monitored in hospital till tomorrow and then likely home if no evidence of overt rebleeding.  She needs clinic follow-up in our office and a repeat outpatient colonoscopy in a few months for polypectomy of the lesion found adjacent to the anastomosis.  _________________  This consultation required a moderate degree of medical decision making due to the nature and complexity of the acute condition(s) being evaluated as well as the patient's medical comorbidities.  [facility] LITT[facility] Brand III [phone]

## 2024-04-25 NOTE — Anesthesia Postprocedure Evaluation (Signed)
 Anesthesia Post Note  Patient: Kristina Osborne  Procedure(s) Performed: COLONOSCOPY IMAGING PROCEDURE, GI TRACT, INTRALUMINAL, VIA CAPSULE     Patient location during evaluation: Endoscopy Anesthesia Type: MAC Level of consciousness: awake Pain management: pain level controlled Vital Signs Assessment: post-procedure vital signs reviewed and stable Respiratory status: spontaneous breathing, nonlabored ventilation and respiratory function stable Cardiovascular status: blood pressure returned to baseline and stable Postop Assessment: no apparent nausea or vomiting Anesthetic complications: no   No notable events documented.  Last Vitals:  Vitals:   04/24/24 2018 04/25/24 0452  BP: (!) 102/57 (!) 99/55  Pulse: 60 70  Resp: 17 16  Temp: 36.6 C 36.5 C  SpO2: 100% 97%    Last Pain:  Vitals:   04/25/24 0452  TempSrc: Oral  PainSc:                  Bernardino SQUIBB Kalley Nicholl

## 2024-04-25 NOTE — Progress Notes (Signed)
 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  She was also noticing fatigue and lightheadedness.  In the emergency room hemodynamically stable.  Hemoglobin 9.5.  Melanotic occult blood positive stool.  Recent hemoglobin was 12 lower was 10 days ago.  Admitted with GI consultation.  Subjective: Patient was seen and examined.  She had few bowel movements without any blood or black stool.  Feels dizzy on attempting to walk  and feels tired. Hemoglobin 8, symptomatic.  Consented for 1 unit transfusion. Case discussed with our cardiologist, patient has completed 6 months of Plavix  therapy after MitraClip and they are agreeable to discontinue further Plavix .  Patient had multiple questions about endoscopic findings, anesthesia notes.  She is appropriately worried about future risk of bleeding.  Assessment & Plan:   GI bleeding, multiple intestinal AVMs.  Symptomatic anemia. Hemoglobin 8 today.  Baseline hemoglobin 11-12. Monitor PRBC transfusion today. Upper GI endoscopy, no source of bleeding. Colonoscopy and enteroscopy with multiple telangiectasia, touch to bleed lesions there were cauterized.   Capsule endoscopy done, results pending. Recheck hemoglobin after transfusion. Mobilize. Hopefully she will stop bleeding significantly after stopping Plavix . Patient may become transfusion dependent given nature of her AVMs and this was explained to her.  Essential thrombocytosis: Patient on hydroxyurea .  Now with pancytopenia.  Holding hydroxyurea .  Discussed with hematology.  Will continue to hold until discharge.  Mitral valve prolapse status post MitraClip: Risk versus benefits  were discussed.   Also discussed with her structural heart team.  Patient was on Plavix  for 6 months.  They agreed to discontinue Plavix .       DVT prophylaxis: SCDs Start: 04/22/24 1828   Code Status: Full code Family Communication: None at the bedside Disposition Plan: Status is: Inpatient Remains inpatient appropriate because: Blood transfusions.     Consultants:  Gastroenterology Hematology, curbside Cardiology, curbside  Procedures:  EGD, colonoscopy, small bowel capsule endoscopy  Antimicrobials:  None     Objective: Vitals:   04/25/24 0452 04/25/24 1037 04/25/24 1041 04/25/24 1056  BP: (!) 99/55 (!) 90/56  98/77  Pulse: 70     Resp: 16 13 17 18   Temp: 97.7 F (36.5 C) 98.4 F (36.9 C)  98.2 F (36.8 C)  TempSrc: Oral Oral  Oral  SpO2: 97% 98%  100%  Weight:      Height:        Intake/Output Summary (Last 24 hours) at 04/25/2024 1250 Last data filed at 04/25/2024 1037 Gross per 24 hour  Intake 1795.59 ml  Output --  Net 1795.59 ml   Filed Weights   04/22/24 1454 04/23/24 1344  Weight: 64.9 kg 64.9 kg    Examination:  General exam: comfortable , appropriately anxious .  Respiratory system: Clear to auscultation. Respiratory effort normal. Cardiovascular system: S1 & S2 heard, RRR. No JVD, murmurs, rubs, gallops or clicks. No pedal edema. Gastrointestinal system: Abdomen is nondistended, soft and nontender. No organomegaly or masses felt. Normal bowel sounds heard. Central nervous system: Alert and oriented. No focal neurological deficits. Extremities: Symmetric 5 x 5 power.    Data Reviewed: I have personally reviewed following labs and imaging studies  CBC: Recent Labs  Lab 04/22/24 1400 04/22/24 1855 04/23/24 0537 04/23/24  1735 04/24/24 0619 04/24/24 1702 04/25/24 0526  WBC 4.9  --  3.0* 3.2* 3.9* 4.6 3.8*  NEUTROABS 3.8  --   --  2.4 3.2 3.7 3.0  HGB 9.5*   < > 8.9* 8.6* 8.6* 9.4* 8.0*  HCT 27.8*   < > 28.2* 27.1* 27.4* 28.4*  25.4*  MCV 129.3*  --  135.6* 135.5* 138.4* 134.0* 138.0*  PLT 177  --  138* 143* 128* 143* 130*   < > = values in this interval not displayed.   Basic Metabolic Panel: Recent Labs  Lab 04/22/24 1400 04/23/24 0537 04/24/24 0619  NA 139 141 141  K 3.9 3.9 4.1  CL 106 106 108  CO2 28 23 20*  GLUCOSE 96 95 98  BUN 14 12 8   CREATININE 0.67 0.68 0.62  CALCIUM  9.5 9.6 8.9   GFR: Estimated Creatinine Clearance: 59.5 mL/min (by C-G formula based on SCr of 0.62 mg/dL). Liver Function Tests: Recent Labs  Lab 04/22/24 1400  AST 22  ALT 14  ALKPHOS 50  BILITOT 0.6  PROT 6.3*  ALBUMIN  4.0   No results for input(s): LIPASE, AMYLASE in the last 168 hours. No results for input(s): AMMONIA in the last 168 hours. Coagulation Profile: No results for input(s): INR, PROTIME in the last 168 hours. Cardiac Enzymes: No results for input(s): CKTOTAL, CKMB, CKMBINDEX, TROPONINI in the last 168 hours. BNP (last 3 results) Recent Labs    11/26/23 1148  PROBNP 474*   HbA1C: No results for input(s): HGBA1C in the last 72 hours. CBG: No results for input(s): GLUCAP in the last 168 hours. Lipid Profile: No results for input(s): CHOL, HDL, LDLCALC, TRIG, CHOLHDL, LDLDIRECT in the last 72 hours. Thyroid Function Tests: No results for input(s): TSH, T4TOTAL, FREET4, T3FREE, THYROIDAB in the last 72 hours. Anemia Panel: Recent Labs    04/22/24 1358 04/24/24 1916  VITAMINB12  --  221  FERRITIN 37 23  TIBC  --  256  IRON   --  36   Sepsis Labs: No results for input(s): PROCALCITON, LATICACIDVEN in the last 168 hours.  No results found for this or any previous visit (from the past 240 hours).       Radiology Studies: No results found.       Scheduled Meds:  pantoprazole   40 mg Oral Daily   Continuous Infusions:  sodium chloride  100 mL/hr at 04/24/24 1650     LOS: 3 days    Time spent: 40 minutes    Renato Applebaum,  MD [facility]

## 2024-04-25 NOTE — Telephone Encounter (Signed)
 Spoke with pt who is currently in the hospital. Pt states she is having a GI bleed and wanted to talk to someone in structural heart. Pt states she has called three times and this is the first call back she has gotten. Told pt if she is in the hospital they will most likely put in a referral to the correct department and to follow those instructions. Patient then stated the Hospital doctor was in the room and had to go.

## 2024-04-25 NOTE — Telephone Encounter (Signed)
 Pt called in asking to speak to someone from structural heart team about her having a GI bleed and being on a blood thinner.

## 2024-04-26 DIAGNOSIS — K922 Gastrointestinal hemorrhage, unspecified: Secondary | ICD-10-CM | POA: Diagnosis not present

## 2024-04-26 LAB — CBC WITH DIFFERENTIAL/PLATELET
Abs Immature Granulocytes: 0.02 K/uL (ref 0.00–0.07)
Basophils Absolute: 0 K/uL (ref 0.0–0.1)
Basophils Relative: 0 %
Eosinophils Absolute: 0 K/uL (ref 0.0–0.5)
Eosinophils Relative: 0 %
HCT: 29.5 % — ABNORMAL LOW (ref 36.0–46.0)
Hemoglobin: 9.5 g/dL — ABNORMAL LOW (ref 12.0–15.0)
Immature Granulocytes: 1 %
Lymphocytes Relative: 12 %
Lymphs Abs: 0.5 K/uL — ABNORMAL LOW (ref 0.7–4.0)
MCH: 41.1 pg — ABNORMAL HIGH (ref 26.0–34.0)
MCHC: 32.2 g/dL (ref 30.0–36.0)
MCV: 127.7 fL — ABNORMAL HIGH (ref 80.0–100.0)
Monocytes Absolute: 0.4 K/uL (ref 0.1–1.0)
Monocytes Relative: 10 %
Neutro Abs: 3.1 K/uL (ref 1.7–7.7)
Neutrophils Relative %: 77 %
Platelets: 154 K/uL (ref 150–400)
RBC: 2.31 MIL/uL — ABNORMAL LOW (ref 3.87–5.11)
WBC: 4 K/uL (ref 4.0–10.5)
nRBC: 0 % (ref 0.0–0.2)

## 2024-04-26 MED ORDER — SODIUM CHLORIDE 0.9 % IV SOLN: INTRAVENOUS | Status: DC

## 2024-04-26 NOTE — Plan of Care (Signed)

## 2024-04-26 NOTE — Plan of Care (Signed)
  Problem: Education: Goal: Knowledge of General Education information will improve Description: Including pain rating scale, medication(s)/side effects and non-pharmacologic comfort measures 04/26/2024 0934 by Rosanne Elspeth HERO, RN Outcome: Adequate for Discharge 04/26/2024 [phone] by Rosanne Elspeth HERO, RN Outcome: Progressing   Problem: Health Behavior/Discharge Planning: Goal: Ability to manage health-related needs will improve 04/26/2024 0934 by Rosanne Elspeth HERO, RN Outcome: Adequate for Discharge 04/26/2024 [phone] by Rosanne Elspeth HERO, RN Outcome: Progressing   Problem: Clinical Measurements: Goal: Ability to maintain clinical measurements within normal limits will improve 04/26/2024 0934 by Rosanne Elspeth HERO, RN Outcome: Adequate for Discharge 04/26/2024 [phone] by Rosanne Elspeth HERO, RN Outcome: Progressing Goal: Will remain free from infection 04/26/2024 0934 by Rosanne Elspeth HERO, RN Outcome: Adequate for Discharge 04/26/2024 [phone] by Rosanne Elspeth HERO, RN Outcome: Progressing Goal: Diagnostic test results will improve 04/26/2024 0934 by Rosanne Elspeth HERO, RN Outcome: Adequate for Discharge 04/26/2024 [phone] by Rosanne Elspeth HERO, RN Outcome: Progressing Goal: Respiratory complications will improve 04/26/2024 0934 by Rosanne Elspeth HERO, RN Outcome: Adequate for Discharge 04/26/2024 [phone] by Rosanne Elspeth HERO, RN Outcome: Progressing Goal: Cardiovascular complication will be avoided 04/26/2024 0934 by Rosanne Elspeth HERO, RN Outcome: Adequate for Discharge 04/26/2024 [phone] by Rosanne Elspeth HERO, RN Outcome: Progressing   Problem: Activity: Goal: Risk for activity intolerance will decrease 04/26/2024 0934 by Rosanne Elspeth HERO, RN Outcome: Adequate for Discharge 04/26/2024 [phone] by Rosanne Elspeth HERO, RN Outcome: Progressing   Problem: Nutrition: Goal: Adequate nutrition will be maintained 04/26/2024 0934 by Rosanne Elspeth HERO, RN Outcome:  Adequate for Discharge 04/26/2024 [phone] by Rosanne Elspeth HERO, RN Outcome: Progressing   Problem: Coping: Goal: Level of anxiety will decrease 04/26/2024 0934 by Rosanne Elspeth HERO, RN Outcome: Adequate for Discharge 04/26/2024 [phone] by Rosanne Elspeth HERO, RN Outcome: Progressing   Problem: Elimination: Goal: Will not experience complications related to bowel motility 04/26/2024 0934 by Rosanne Elspeth HERO, RN Outcome: Adequate for Discharge 04/26/2024 [phone] by Rosanne Elspeth HERO, RN Outcome: Progressing Goal: Will not experience complications related to urinary retention 04/26/2024 0934 by Rosanne Elspeth HERO, RN Outcome: Adequate for Discharge 04/26/2024 [phone] by Rosanne Elspeth HERO, RN Outcome: Progressing   Problem: Pain Managment: Goal: General experience of comfort will improve and/or be controlled 04/26/2024 0934 by Rosanne Elspeth HERO, RN Outcome: Adequate for Discharge 04/26/2024 [phone] by Rosanne Elspeth HERO, RN Outcome: Progressing   Problem: Safety: Goal: Ability to remain free from injury will improve 04/26/2024 0934 by Rosanne Elspeth HERO, RN Outcome: Adequate for Discharge 04/26/2024 [phone] by Rosanne Elspeth HERO, RN Outcome: Progressing   Problem: Skin Integrity: Goal: Risk for impaired skin integrity will decrease 04/26/2024 0934 by Rosanne Elspeth HERO, RN Outcome: Adequate for Discharge 04/26/2024 [phone] by Rosanne Elspeth HERO, RN Outcome: Progressing

## 2024-04-26 NOTE — Discharge Summary (Signed)
 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  She was also noticing fatigue and lightheadedness.  In the emergency room hemodynamically stable.  Hemoglobin 9.5.  Melanotic occult blood positive stool.  Recent hemoglobin was 12 about 10 days ago.  Admitted with GI consultation.  Underwent extensive investigations as below.  Also received 1 unit of PRBC and IV iron  therapy.  Clinically stabilizing.   GI bleeding, multiple intestinal AVMs.  Symptomatic anemia. Hemoglobin dropped to 8 -1 unit of PRBC transfusion -9.5 -9.5.  Currently no evidence of ongoing bleeding.   Patient was also given 1 unit of IV iron .   Small bowel enteroscopy, no evidence of AVM or bleeding. Colonoscopy, multiple colonic angiectasia's.  Treated with APC.  Clips. Capsule endoscopy, jejunal anastomosis with staples, no source of bleeding.  Distal nonbleeding diminutive small bowel AVMs. GI to schedule outpatient follow-up.  Will need close monitoring of hemoglobin.  Conservative management.  Plavix  discontinued.   Essential thrombocytosis: Patient on hydroxyurea .  Now with pancytopenia likely secondary to acute bleeding.  Holding hydroxyurea .  Discussed with hematology.  Will continue to hold until outpatient follow-up.   Mitral valve prolapse status post MitraClip: Risk versus benefits were discussed.   Also  discussed with her structural heart team.  Patient was on Plavix  for 6 months.  They agreed to discontinue Plavix .     Stabilized to discharge home with outpatient follow-up.  Holding hydroxyurea .  Patient unable to take oral iron  as she will have difficulty differentiating between melanotic stool and iron .        Discharge Diagnoses:  Principal Problem:   Lower GI bleed Active Problems:   AVM (arteriovenous malformation) of colon   Acute blood loss anemia   Melena   AVM (arteriovenous malformation) of small bowel, acquired with hemorrhage    Discharge Instructions  Discharge Instructions     Diet general   Complete by: As directed    Increase activity slowly   Complete by: As directed       Allergies as of 04/26/2024       Reactions   Aspirin Other (See Comments)   Due to intestinal issue   Sulfa Antibiotics Other (See Comments)   bleeding        Medication List     PAUSE taking these medications    hydroxyurea  500 MG capsule Wait to take this until: May 09, 2024 Commonly known as: HYDREA  Take 2 capsules (1,000 mg total) by mouth 2 (two) times daily. May take with food to minimize GI side effects. What changed:  how much to take when to take this additional instructions       STOP taking these medications    cholestyramine  light 4 g packet Commonly known as: PREVALITE    clopidogrel  75 MG tablet Commonly known as: PLAVIX    dicyclomine  20 MG tablet Commonly known as: BENTYL        TAKE these medications    acetaminophen  500 MG tablet Commonly known  as: TYLENOL  Take 1,000 mg by mouth every 6 (six) hours as needed for headache (pain.).   amoxicillin  500 MG tablet Commonly known as: AMOXIL  Take 4 tablets (2,000 mg total) by mouth as directed. 1 hour prior to dental work including cleanings What changed:  when to take this reasons to take this   COSAMIN DS PO Take 1 tablet by mouth daily.   famotidine  40 MG tablet Commonly  known as: PEPCID  TAKE 1 TABLET(40 MG) BY MOUTH DAILY   folic acid  1 MG tablet Commonly known as: FOLVITE  TAKE 1 TABLET(1 MG) BY MOUTH DAILY What changed: See the new instructions.   furosemide  40 MG tablet Commonly known as: LASIX  Take 1 tablet (40 mg total) by mouth as needed for fluid or edema.   Lycopene 10 MG Caps Take 10 mg by mouth daily.   sucralfate  1 g tablet Commonly known as: CARAFATE  TAKE 1 TABLET(1 GRAM) BY MOUTH FOUR TIMES DAILY AT BEDTIME WITH MEALS        Allergies  Allergen Reactions   Aspirin Other (See Comments)    Due to intestinal issue   Sulfa Antibiotics Other (See Comments)    bleeding    Consultations: Gastroenterology   Procedures/Studies: DG Chest Portable 1 View Result Date: 04/22/2024 CLINICAL DATA:  Chest pain. Decreased blood counts over the last week. EXAM: PORTABLE CHEST 1 VIEW COMPARISON:  02/07/2024 FINDINGS: Postoperative changes in the mediastinum. Calcification in the mitral valve annulus. Heart size and pulmonary vascularity are normal for technique. Lungs are clear. No pleural effusion or pneumothorax. Mediastinal contours appear intact. IMPRESSION: No active disease. Electronically Signed   By: Elsie Gravely M.D.   On: 04/22/2024 18:10   (Echo, Carotid, EGD, Colonoscopy, ERCP)    Subjective: Patient seen in the morning rounds.  No overnight events.  She had brown-colored stool.   Discharge Exam: Vitals:   04/25/24 2201 04/26/24 0555  BP: 115/64 (!) 107/59  Pulse: 78 67  Resp: 18 (!) 24  Temp: 98.4 F (36.9 C) 98.4 F (36.9 C)  SpO2: 100% 97%   Vitals:   04/25/24 1056 04/25/24 1311 04/25/24 2201 04/26/24 0555  BP: 98/77 105/61 115/64 (!) 107/59  Pulse:   78 67  Resp: 18 20 18  (!) 24  Temp: 98.2 F (36.8 C) 98 F (36.7 C) 98.4 F (36.9 C) 98.4 F (36.9 C)  TempSrc: Oral Oral Oral Oral  SpO2: 100% 100% 100% 97%  Weight:      Height:        General: Pt is alert, awake, not in acute  distress Cardiovascular: RRR, S1/S2 +, no rubs, no gallops Respiratory: CTA bilaterally, no wheezing, no rhonchi Abdominal: Soft, NT, ND, bowel sounds + Extremities: no edema, no cyanosis    The results of significant diagnostics from this hospitalization (including imaging, microbiology, ancillary and laboratory) are listed below for reference.     Microbiology: No results found for this or any previous visit (from the past 240 hours).   Labs: BNP (last 3 results) Recent Labs    10/06/23 0853 10/30/23 1630  BNP 195.5* 99.4   Basic Metabolic Panel: Recent Labs  Lab 04/22/24 1400 04/23/24 0537 04/24/24 0619  NA 139 141 141  K 3.9 3.9 4.1  CL 106 106 108  CO2 28 23 20*  GLUCOSE 96 95 98  BUN 14 12 8   CREATININE 0.67 0.68 0.62  CALCIUM  9.5 9.6 8.9   Liver Function Tests: Recent Labs  Lab 04/22/24 1400  AST 22  ALT 14  ALKPHOS 50  BILITOT 0.6  PROT 6.3*  ALBUMIN  4.0   No results for input(s): LIPASE, AMYLASE in the last 168 hours. No results for input(s): AMMONIA in the last 168 hours. CBC: Recent Labs  Lab 04/24/24 0619 04/24/24 1702 04/25/24 0526 04/25/24 1612 04/26/24 0603  WBC 3.9* 4.6 3.8* 5.2 4.0  NEUTROABS 3.2 3.7 3.0 4.2 3.1  HGB 8.6* 9.4* 8.0* 9.5* 9.5*  HCT 27.4* 28.4* 25.4* 29.0* 29.5*  MCV 138.4* 134.0* 138.0* 125.5* 127.7*  PLT 128* 143* 130* 156 154   Cardiac Enzymes: No results for input(s): CKTOTAL, CKMB, CKMBINDEX, TROPONINI in the last 168 hours. BNP: Invalid input(s): POCBNP CBG: No results for input(s): GLUCAP in the last 168 hours. D-Dimer No results for input(s): DDIMER in the last 72 hours. Hgb A1c No results for input(s): HGBA1C in the last 72 hours. Lipid Profile No results for input(s): CHOL, HDL, LDLCALC, TRIG, CHOLHDL, LDLDIRECT in the last 72 hours. Thyroid function studies No results for input(s): TSH, T4TOTAL, T3FREE, THYROIDAB in the last 72 hours.  Invalid input(s):  FREET3 Anemia work up Recent Labs    04/24/24 1916  VITAMINB12 221  FERRITIN 23  TIBC 256  IRON  36   Urinalysis    Component Value Date/Time   COLORURINE YELLOW 06/24/2022 2119   APPEARANCEUR CLEAR 06/24/2022 2119   LABSPEC >1.030 (H) 06/24/2022 2119   PHURINE 5.5 06/24/2022 2119   GLUCOSEU NEGATIVE 06/24/2022 2119   HGBUR NEGATIVE 06/24/2022 2119   BILIRUBINUR NEGATIVE 06/24/2022 2119   KETONESUR NEGATIVE 06/24/2022 2119   PROTEINUR NEGATIVE 06/24/2022 2119   NITRITE NEGATIVE 06/24/2022 2119   LEUKOCYTESUR NEGATIVE 06/24/2022 2119   Sepsis Labs Recent Labs  Lab 04/24/24 1702 04/25/24 0526 04/25/24 1612 04/26/24 0603  WBC 4.6 3.8* 5.2 4.0   Microbiology No results found for this or any previous visit (from the past 240 hours).   Time coordinating discharge: 32 minutes  SIGNED:   Renato Applebaum, MD  [facility] 04/26/2024, 3:15 PM

## 2024-04-26 NOTE — Progress Notes (Signed)
 Subjective: No complaints.  Feeling well.  Objective: Vital signs in last 24 hours: Temp:  [98 F (36.7 C)-98.4 F (36.9 C)] 98.4 F (36.9 C) (08/30 0555) Pulse Rate:  [67-78] 67 (08/30 0555) Resp:  [13-24] 24 (08/30 0555) BP: (90-115)/(56-77) 107/59 (08/30 0555) SpO2:  [97 %-100 %] 97 % (08/30 0555) Last BM Date : 04/25/24  Intake/Output from previous day: 08/29 0701 - 08/30 0700 In: 2294.3 [P.O.:960; I.V.:941.3; Blood:283; IV Piggyback:110] Out: -  Intake/Output this shift: No intake/output data recorded.  General appearance: alert and no distress GI: soft, non-tender; bowel sounds normal; no masses,  no organomegaly  Lab Results: Recent Labs    04/25/24 0526 04/25/24 1612 04/26/24 0603  WBC 3.8* 5.2 4.0  HGB 8.0* 9.5* 9.5*  HCT 25.4* 29.0* 29.5*  PLT 130* 156 154   BMET Recent Labs    04/24/24 0619  NA 141  K 4.1  CL 108  CO2 20*  GLUCOSE 98  BUN 8  CREATININE 0.62  CALCIUM  8.9   LFT No results for input(s): PROT, ALBUMIN , AST, ALT, ALKPHOS, BILITOT, BILIDIR, IBILI in the last 72 hours. PT/INR No results for input(s): LABPROT, INR in the last 72 hours. Hepatitis Panel No results for input(s): HEPBSAG, HCVAB, HEPAIGM, HEPBIGM in the last 72 hours. C-Diff No results for input(s): CDIFFTOX in the last 72 hours. Fecal Lactopherrin No results for input(s): FECLLACTOFRN in the last 72 hours.  Studies/Results: No results found.  Medications: Scheduled:  pantoprazole   40 mg Oral Daily   Continuous:  Assessment/Plan: 1) Colonic AVMs s/p APC and hemoclipping. 2) Anemia.   The patient is stable.  Her HGB is unchanged and clinically she feels well off of Plavix .  There is no further evidence of bleeding.  Plan: 1) Okay for D/C home. 2) Follow up with [facility] in 2-4 weeks.  LOS: 4 days   Renly Guedes D 04/26/2024, 8:09 AM

## 2024-04-29 ENCOUNTER — Encounter (HOSPITAL_COMMUNITY): Payer: Self-pay | Admitting: Gastroenterology

## 2024-04-29 LAB — TYPE AND SCREEN
ABO/RH(D): A POS
Antibody Screen: NEGATIVE
Unit division: 0

## 2024-04-29 LAB — BPAM RBC
Blood Product Expiration Date: 202509232359
ISSUE DATE / TIME: 202508291027
Unit Type and Rh: 6200

## 2024-04-29 NOTE — Addendum Note (Signed)
 Encounter addended by: Valere Arnoldo HERO on: 04/29/2024 10:00 AM  Actions taken: Episode resolved

## 2024-05-02 ENCOUNTER — Telehealth: Payer: Self-pay | Admitting: *Deleted

## 2024-05-02 NOTE — Telephone Encounter (Signed)
 Received call from pt. She states she is experiencing some pain behind her left shoulder blade that radiates to her back, sometimes in her right shoulder. Denies any recent injury/fall etc. Denies chest pain, SOB, diaphoresis, n/v. No blood noted in her stools. She states she feels like it is muscular in nature. Advised that if it continues/worsens she needs to contact her PCP/Cardiologist of go to the ED. Otherwise she can try tylenol  and heating pad to her neck/shoulder. Pt voiced understanding.

## 2024-05-08 ENCOUNTER — Other Ambulatory Visit

## 2024-05-09 ENCOUNTER — Inpatient Hospital Stay

## 2024-05-11 ENCOUNTER — Other Ambulatory Visit: Payer: Self-pay | Admitting: Hematology and Oncology

## 2024-05-12 ENCOUNTER — Inpatient Hospital Stay: Attending: Hematology and Oncology

## 2024-05-12 DIAGNOSIS — D473 Essential (hemorrhagic) thrombocythemia: Secondary | ICD-10-CM | POA: Insufficient documentation

## 2024-05-12 DIAGNOSIS — Z79899 Other long term (current) drug therapy: Secondary | ICD-10-CM | POA: Diagnosis not present

## 2024-05-12 DIAGNOSIS — D500 Iron deficiency anemia secondary to blood loss (chronic): Secondary | ICD-10-CM | POA: Diagnosis present

## 2024-05-12 DIAGNOSIS — K5520 Angiodysplasia of colon without hemorrhage: Secondary | ICD-10-CM | POA: Diagnosis present

## 2024-05-12 LAB — CMP (CANCER CENTER ONLY)
ALT: 12 U/L (ref 0–44)
AST: 21 U/L (ref 15–41)
Albumin: 4.5 g/dL (ref 3.5–5.0)
Alkaline Phosphatase: 53 U/L (ref 38–126)
Anion gap: 4 — ABNORMAL LOW (ref 5–15)
BUN: 12 mg/dL (ref 8–23)
CO2: 30 mmol/L (ref 22–32)
Calcium: 9.8 mg/dL (ref 8.9–10.3)
Chloride: 106 mmol/L (ref 98–111)
Creatinine: 0.8 mg/dL (ref 0.44–1.00)
GFR, Estimated: >60 mL/min (ref >60)
Glucose, Bld: 121 mg/dL — ABNORMAL HIGH (ref 70–99)
Potassium: 3.9 mmol/L (ref 3.5–5.1)
Sodium: 140 mmol/L (ref 135–145)
Total Bilirubin: 0.4 mg/dL (ref 0.0–1.2)
Total Protein: 6.7 g/dL (ref 6.5–8.1)

## 2024-05-12 LAB — CBC WITH DIFFERENTIAL (CANCER CENTER ONLY)
Abs Immature Granulocytes: 0.01 K/uL (ref 0.00–0.07)
Basophils Absolute: 0 K/uL (ref 0.0–0.1)
Basophils Relative: 1 %
Eosinophils Absolute: 0 K/uL (ref 0.0–0.5)
Eosinophils Relative: 0 %
HCT: 35.3 % — ABNORMAL LOW (ref 36.0–46.0)
Hemoglobin: 11.8 g/dL — ABNORMAL LOW (ref 12.0–15.0)
Immature Granulocytes: 0 %
Lymphocytes Relative: 15 %
Lymphs Abs: 0.6 K/uL — ABNORMAL LOW (ref 0.7–4.0)
MCH: 39.5 pg — ABNORMAL HIGH (ref 26.0–34.0)
MCHC: 33.4 g/dL (ref 30.0–36.0)
MCV: 118.1 fL — ABNORMAL HIGH (ref 80.0–100.0)
Monocytes Absolute: 0.5 K/uL (ref 0.1–1.0)
Monocytes Relative: 11 %
Neutro Abs: 3.2 K/uL (ref 1.7–7.7)
Neutrophils Relative %: 73 %
Platelet Count: 894 K/uL — ABNORMAL HIGH (ref 150–400)
RBC: 2.99 MIL/uL — ABNORMAL LOW (ref 3.87–5.11)
RDW: 16.9 % — ABNORMAL HIGH (ref 11.5–15.5)
WBC Count: 4.4 K/uL (ref 4.0–10.5)
nRBC: 0 % (ref 0.0–0.2)

## 2024-05-12 LAB — FERRITIN: Ferritin: 49 ng/mL (ref 11–307)

## 2024-05-13 ENCOUNTER — Other Ambulatory Visit: Payer: Self-pay | Admitting: Hematology and Oncology

## 2024-05-13 ENCOUNTER — Telehealth: Payer: Self-pay | Admitting: *Deleted

## 2024-05-13 NOTE — Telephone Encounter (Signed)
 Kristina Osborne called to follow up on lab results from 9/15 - when to resume hydroxyurea  and at what dose? Per Dr. Federico, she should resume hydroxyurea  500 mg with most recent directions: Take 2 tablets in the morning and 2 tablets at bedtime on Mondays, Wednesdays, and Fridays. Take 2 tablet in the morning and 1 tablet at bedtime on Tuesdays, Thursdays, Sat, and Sun. Labs needed 9/23 and 9/30. Schedule message sent and patient knows to expect call from scheduling.  Kristina Osborne informed of above directions from Dr. Federico. She verbalized understanding of all information.

## 2024-05-15 ENCOUNTER — Ambulatory Visit: Attending: Internal Medicine | Admitting: Internal Medicine

## 2024-05-15 VITALS — BP 90/62 | HR 70 | Ht 66.0 in | Wt 142.0 lb

## 2024-05-15 DIAGNOSIS — I340 Nonrheumatic mitral (valve) insufficiency: Secondary | ICD-10-CM | POA: Insufficient documentation

## 2024-05-15 DIAGNOSIS — I700 Atherosclerosis of aorta: Secondary | ICD-10-CM | POA: Diagnosis present

## 2024-05-15 DIAGNOSIS — Z95818 Presence of other cardiac implants and grafts: Secondary | ICD-10-CM | POA: Insufficient documentation

## 2024-05-15 DIAGNOSIS — Z9889 Other specified postprocedural states: Secondary | ICD-10-CM | POA: Diagnosis present

## 2024-05-15 DIAGNOSIS — E785 Hyperlipidemia, unspecified: Secondary | ICD-10-CM | POA: Diagnosis present

## 2024-05-15 NOTE — Progress Notes (Signed)
 OFFICE CONSULT NOTE  Chief Complaint:  Follow-up mitral valve disease and PVCs  Primary Care Physician: Norleen Lynwood ORN, MD  HPI:  Kristina Osborne is a 73 y.o. female who is being seen today for the evaluation of PVCs at the request of Norleen Lynwood ORN, MD. this is a pleasant 74 year old female who recently came to the area from [place] .  She is a Oncologist.  He reports a longstanding history of palpitations and was diagnosed with mitral valve prolapse a number of years ago.  She has used short acting Inderal  between 10 and 30 mg as needed in the past however earlier this week she was seen in the emergency department for incessant PVCs.  She said that over the weekend she had her hair done and possibly sustained neck injury while her head was extended over the sink.  Ultimately she followed up with her chiropractor about this.  In the ER she was noted to have frequent PVCs.  It was recommended that she stop her Inderal  and switch to metoprolol  tartrate 12.5 mg twice daily.  She has not taken that for the last 2 days after filling the prescription and notes some improvement in her symptoms.  She had some PVCs yesterday while sitting at her desk however none today.  EKG shows normal sinus rhythm at 63.  She apparently has seen a cardiologist last a couple years ago in [place]  and had an echocardiogram.  She had remote stress testing maybe 5 years ago, all of which were reassuring.  She was noted to have some mild regurgitation and prolapse of the mitral valve.  We will request records.  Blood pressure is low normal today 102/64.  She denies chest pain or shortness of breath.  Troponins were negative as part of her ER work-up.  There is family history of heart disease including her mother who had CABG and died of aortic aneurysm subsequent to this.  She has not had recent lipid testing that I could find  05/15/2024  [name] is seen today in follow-up.  She  has done very well after mTEER procedure in April for severe MR and a flail P2 leaflet.  This consisted of 2 MitraClip's in the A2/P2 position.  The follow-up echo showed normal LVEF, moderate LAE and mild to moderate mitral regurgitation which was improved.  She reports substantial improvement in her symptoms, now with NYHA class I symptoms.  She has been able to do almost all of her activities that she could do previously.  She does still get occasional PVCs and feels like she might be having brief episodes of arrhythmia.  I encouraged her to get a home monitoring device such as Apple Watch or Kardia mobile to allow her to try to identify these episodes.  We did discuss the fact that imaging is showing some aortic atherosclerosis but no clear coronary artery calcification.  There is a strong family history of heart disease.  She has not had recent lipid testing.   PMHx:  Past Medical History:  Diagnosis Date   ABLA (acute blood loss anemia) 04/16/2022   Congenital malformation of intestinal fixation    Gastritis    Gastroesophageal reflux disease without esophagitis 09/10/2021   IDA (iron  deficiency anemia)    Mitral valve prolapse    Persistent hyperplasia of thymus (HCC)    S/P Thymectomy   S/P mitral valve clip implantation 11/28/2023   s/p transcatheter mitral valve repair with MitraClip XTW +  NTW at A2/P2 by Dr. Wendel   Thrombocytosis     Past Surgical History:  Procedure Laterality Date   BIOPSY  06/26/2022   Procedure: BIOPSY;  Surgeon: Stacia Glendia BRAVO, MD;  Location: [facility];  Service: Gastroenterology;;   COLONOSCOPY N/A 04/17/2022   Procedure: COLONOSCOPY;  Surgeon: Albertus Gordy HERO, MD;  Location: [facility];  Service: Gastroenterology;  Laterality: N/A;   COLONOSCOPY N/A 04/24/2024   Procedure: COLONOSCOPY;  Surgeon: Legrand Victory LITTIE DOUGLAS, MD;  Location: [facility];  Service: Gastroenterology;  Laterality: N/A;   COLONOSCOPY WITH PROPOFOL  N/A 06/26/2022   Procedure:  COLONOSCOPY WITH PROPOFOL ;  Surgeon: Stacia Glendia BRAVO, MD;  Location: [facility];  Service: Gastroenterology;  Laterality: N/A;   ENTEROSCOPY N/A 04/17/2022   Procedure: ENTEROSCOPY;  Surgeon: Albertus Gordy HERO, MD;  Location: [facility];  Service: Gastroenterology;  Laterality: N/A;   ENTEROSCOPY N/A 06/26/2022   Procedure: ENTEROSCOPY;  Surgeon: Stacia Glendia BRAVO, MD;  Location: [facility];  Service: Gastroenterology;  Laterality: N/A;   ENTEROSCOPY N/A 04/23/2024   Procedure: ENTEROSCOPY;  Surgeon: Legrand Victory LITTIE DOUGLAS, MD;  Location: [facility];  Service: Gastroenterology;  Laterality: N/A;   GIVENS CAPSULE STUDY N/A 06/26/2022   Procedure: GIVENS CAPSULE STUDY;  Surgeon: Stacia Glendia BRAVO, MD;  Location: [facility];  Service: Gastroenterology;  Laterality: N/A;   GIVENS CAPSULE STUDY N/A 04/24/2024   Procedure: IMAGING PROCEDURE, GI TRACT, INTRALUMINAL, VIA CAPSULE;  Surgeon: Legrand Victory LITTIE DOUGLAS, MD;  Location: [facility];  Service: Gastroenterology;  Laterality: N/A;   HEMOSTASIS CLIP PLACEMENT  04/17/2022   Procedure: HEMOSTASIS CLIP PLACEMENT;  Surgeon: Albertus Gordy HERO, MD;  Location: [facility];  Service: Gastroenterology;;   HEMOSTASIS CLIP PLACEMENT  06/26/2022   Procedure: HEMOSTASIS CLIP PLACEMENT;  Surgeon: Stacia Glendia BRAVO, MD;  Location: [facility];  Service: Gastroenterology;;   HOT HEMOSTASIS N/A 04/17/2022   Procedure: HOT HEMOSTASIS (ARGON PLASMA COAGULATION/BICAP);  Surgeon: Albertus Gordy HERO, MD;  Location: [facility];  Service: Gastroenterology;  Laterality: N/A;   HOT HEMOSTASIS N/A 06/26/2022   Procedure: HOT HEMOSTASIS (ARGON PLASMA COAGULATION/BICAP);  Surgeon: Stacia Glendia BRAVO, MD;  Location: [facility];  Service: Gastroenterology;  Laterality: N/A;   POLYPECTOMY  04/17/2022   Procedure: POLYPECTOMY;  Surgeon: Albertus Gordy HERO, MD;  Location: [facility];  Service: Gastroenterology;;   POLYPECTOMY  06/26/2022   Procedure: POLYPECTOMY;  Surgeon:  Stacia Glendia BRAVO, MD;  Location: [facility];  Service: Gastroenterology;;   RIGHT/LEFT HEART CATH AND CORONARY ANGIOGRAPHY N/A 11/01/2023   Procedure: RIGHT/LEFT HEART CATH AND CORONARY ANGIOGRAPHY;  Surgeon: Darron Deatrice LABOR, MD;  Location: [facility];  Service: Cardiovascular;  Laterality: N/A;   SMALL INTESTINE SURGERY  1994   SUBMUCOSAL TATTOO INJECTION  06/26/2022   Procedure: SUBMUCOSAL TATTOO INJECTION;  Surgeon: Stacia Glendia BRAVO, MD;  Location: [facility];  Service: Gastroenterology;;   THYMECTOMY N/A    TRANSCATHETER MITRAL EDGE TO EDGE REPAIR N/A 11/28/2023   Procedure: TRANSCATHETER MITRAL EDGE TO EDGE REPAIR;  Surgeon: Wendel Lurena POUR, MD;  Location: [facility];  Service: Cardiovascular;  Laterality: N/A;   TRANSESOPHAGEAL ECHOCARDIOGRAM (CATH LAB) N/A 10/24/2023   Procedure: TRANSESOPHAGEAL ECHOCARDIOGRAM;  Surgeon: Mona Vinie BROCKS, MD;  Location: [facility];  Service: Cardiovascular;  Laterality: N/A;   TRANSESOPHAGEAL ECHOCARDIOGRAM (CATH LAB) N/A 11/28/2023   Procedure: TRANSESOPHAGEAL ECHOCARDIOGRAM;  Surgeon: Wendel Lurena POUR, MD;  Location: [facility];  Service: Cardiovascular;  Laterality: N/A;    FAMHx:  Family History  Problem Relation Age of Onset   Heart disease Mother    Valvular heart disease Mother    Healthy Sister    Alcoholism Half-Sister     SOCHx:   reports that she quit smoking about 23 years ago. Her smoking use included cigarettes. She has never used smokeless tobacco. She reports that she does not currently use alcohol. She reports that she does not use drugs.  ALLERGIES:  Allergies  Allergen Reactions   Aspirin Other (See Comments)    Due to intestinal issue   Sulfa Antibiotics Other (See Comments)    bleeding    ROS: Pertinent items noted in HPI and remainder of comprehensive ROS otherwise negative.  HOME MEDS: Current Outpatient Medications on File Prior to Visit  Medication Sig Dispense Refill    acetaminophen  (TYLENOL ) 500 MG tablet Take 1,000 mg by mouth every 6 (six) hours as needed for headache (pain.).     famotidine  (PEPCID ) 40 MG tablet TAKE 1 TABLET(40 MG) BY MOUTH DAILY 90 tablet 0   folic acid  (FOLVITE ) 1 MG tablet TAKE 1 TABLET(1 MG) BY MOUTH DAILY 90 tablet 1   hydroxyurea  (HYDREA ) 500 MG capsule Take 2 capsules (1,000 mg total) by mouth 2 (two) times daily. May take with food to minimize GI side effects. 120 capsule 1   sucralfate  (CARAFATE ) 1 g tablet TAKE 1 TABLET(1 GRAM) BY MOUTH FOUR TIMES DAILY AT BEDTIME WITH MEALS 360 tablet 1   amoxicillin  (AMOXIL ) 500 MG tablet Take 4 tablets (2,000 mg total) by mouth as directed. 1 hour prior to dental work including cleanings (Patient not taking: Reported on 05/15/2024) 12 tablet 12   furosemide  (LASIX ) 40 MG tablet Take 1 tablet (40 mg total) by mouth as needed for fluid or edema. (Patient not taking: Reported on 05/15/2024) 90 tablet 3   Glucosamine-Chondroitin (COSAMIN DS PO) Take 1 tablet by mouth daily. (Patient not taking: Reported on 05/15/2024)     Lycopene 10 MG CAPS Take 10 mg by mouth daily. (Patient not taking: Reported on 05/15/2024)     No current facility-administered medications on file prior to visit.    LABS/IMAGING: No results found for this or any previous visit (from the past 48 hours).  No results found.  LIPID PANEL: No results found for: CHOL, TRIG, HDL, CHOLHDL, VLDL, LDLCALC, LDLDIRECT  WEIGHTS: Wt Readings from Last 3 Encounters:  05/15/24 142 lb (64.4 kg)  04/23/24 143 lb (64.9 kg)  04/10/24 142 lb (64.4 kg)    VITALS: BP 90/62 (BP Location: Right Arm, Patient Position: Sitting, Cuff Size: Normal)   Pulse 70   Ht 5' 6 (1.676 m)   Wt 142 lb (64.4 kg)   LMP  (LMP Unknown)   SpO2 97%   BMI 22.92 kg/m   EXAM: General appearance: alert and no distress Neck: no carotid bruit, no JVD, and thyroid not enlarged, symmetric, no tenderness/mass/nodules Lungs: clear to auscultation  bilaterally Heart: regular rate and rhythm, S1, S2 normal, systolic murmur: early systolic 2/6, blowing at apex, and mid systolic click present Abdomen: soft, non-tender; bowel sounds normal; no masses,  no organomegaly Extremities: extremities normal, atraumatic, no cyanosis or edema Pulses: 2+ and symmetric Skin: Skin color, texture, turgor normal. No rashes or lesions Neurologic: Grossly normal Psych: Pleasant  EKG: Deferred  ASSESSMENT: PVCs History of mitral valve prolapse and severe mitral regurgitation, status post mTEER (11/28/2023) Aortic atherosclerosis  PLAN: 1.   Ms. Chagnon is doing very well after her mTEER procedure in April and now  has mild to moderate MR but NYHA class I symptoms.  She was noted to have some aortic atherosclerosis and has a strong family history of heart disease.  I would like to repeat lipids including NMR and LP(a) and she can have the lab work drawn with her cancer center labs next week.  I would like to target LDL less than 70.  With regards to PVCs and palpitations have encouraged her to consider point-of-care evaluation as mentioned above in the progress note.  Will schedule follow-up with me in 1 year and she already has a follow-up echo and structural heart clinic follow-up recall in 1 year as well.  Vinie KYM Maxcy, MD, [facility], FNLA, FACP    [facility]  Medical Director of the [facility]  Cardiovascular Risk Reduction Clinic Diplomate of the American Board of Clinical Lipidology Attending Cardiologist  Direct Dial: [phone]  Fax: [phone]  Website:  [email or web address] Vinie BROCKS Minola Guin 05/15/2024, 10:00 AM

## 2024-05-15 NOTE — Patient Instructions (Signed)
 Medication Instructions:   No changes *If you need a refill on your cardiac medications before your next appointment, please call your pharmacy*   Lab Work: NMR LP(a) If you have labs (blood work) drawn today and your tests are completely normal, you will receive your results only by: MyChart Message (if you have MyChart) OR A paper copy in the mail If you have any lab test that is abnormal or we need to change your treatment, we will call you to review the results.   Testing/Procedures: Not needed  Follow-Up: At [facility], you and your health needs are our priority.  As part of our continuing mission to provide you with exceptional heart care, we have created designated Provider Care Teams.  These Care Teams include your primary Cardiologist (physician) and Advanced Practice Providers (APPs -  Physician Assistants and Nurse Practitioners) who all work together to provide you with the care you need, when you need it.     Your next appointment:   12 month(s)  The format for your next appointment:   In Person  Provider:   Vinie JAYSON Maxcy, MD

## 2024-05-16 ENCOUNTER — Emergency Department (HOSPITAL_COMMUNITY)
Admission: EM | Admit: 2024-05-16 | Discharge: 2024-05-17 | Disposition: A | Discharge status: Home / Self Care | Attending: Emergency Medicine | Admitting: Emergency Medicine

## 2024-05-16 DIAGNOSIS — D696 Thrombocytopenia, unspecified: Secondary | ICD-10-CM | POA: Insufficient documentation

## 2024-05-16 DIAGNOSIS — R202 Paresthesia of skin: Secondary | ICD-10-CM | POA: Diagnosis not present

## 2024-05-16 DIAGNOSIS — R29810 Facial weakness: Secondary | ICD-10-CM | POA: Insufficient documentation

## 2024-05-16 DIAGNOSIS — R4182 Altered mental status, unspecified: Secondary | ICD-10-CM | POA: Diagnosis not present

## 2024-05-16 DIAGNOSIS — R200 Anesthesia of skin: Secondary | ICD-10-CM | POA: Diagnosis present

## 2024-05-16 NOTE — ED Triage Notes (Signed)
 Pt BIB EMS from home. Pt reports she was talking to her husband and felt right sided facial numbness, pt also reports her smile appeared drooped, difficulty with some speech. Pt reports earlier today she had a headache that began around 1930 that was relieved with tylenol  pta.   Pt reports she stopped plavix  3-4 wks ago d/t procedure complications. Pt also reports intermittently numbness in fingers and leg recently.   EMS VS 112/64, cbg 135, HR 80's, 98%

## 2024-05-17 ENCOUNTER — Other Ambulatory Visit: Payer: Self-pay

## 2024-05-17 ENCOUNTER — Emergency Department (HOSPITAL_COMMUNITY)

## 2024-05-17 DIAGNOSIS — R200 Anesthesia of skin: Secondary | ICD-10-CM | POA: Diagnosis not present

## 2024-05-17 LAB — DIFFERENTIAL
Abs Immature Granulocytes: 0.04 K/uL (ref 0.00–0.07)
Basophils Absolute: 0 K/uL (ref 0.0–0.1)
Basophils Relative: 1 %
Eosinophils Absolute: 0 K/uL (ref 0.0–0.5)
Eosinophils Relative: 0 %
Immature Granulocytes: 1 %
Lymphocytes Relative: 12 %
Lymphs Abs: 0.8 K/uL (ref 0.7–4.0)
Monocytes Absolute: 0.5 K/uL (ref 0.1–1.0)
Monocytes Relative: 8 %
Neutro Abs: 4.8 K/uL (ref 1.7–7.7)
Neutrophils Relative %: 78 %

## 2024-05-17 LAB — COMPREHENSIVE METABOLIC PANEL WITH GFR
ALT: 14 U/L (ref 0–44)
AST: 30 U/L (ref 15–41)
Albumin: 4.2 g/dL (ref 3.5–5.0)
Alkaline Phosphatase: 55 U/L (ref 38–126)
Anion gap: 14 (ref 5–15)
BUN: 11 mg/dL (ref 8–23)
CO2: 23 mmol/L (ref 22–32)
Calcium: 10.2 mg/dL (ref 8.9–10.3)
Chloride: 102 mmol/L (ref 98–111)
Creatinine, Ser: 0.75 mg/dL (ref 0.44–1.00)
GFR, Estimated: >60 mL/min (ref >60)
Glucose, Bld: 109 mg/dL — ABNORMAL HIGH (ref 70–99)
Potassium: 4.2 mmol/L (ref 3.5–5.1)
Sodium: 139 mmol/L (ref 135–145)
Total Bilirubin: 0.7 mg/dL (ref 0.0–1.2)
Total Protein: 6.6 g/dL (ref 6.5–8.1)

## 2024-05-17 LAB — CBC
HCT: 36.7 % (ref 36.0–46.0)
Hemoglobin: 11.9 g/dL — ABNORMAL LOW (ref 12.0–15.0)
MCH: 38.4 pg — ABNORMAL HIGH (ref 26.0–34.0)
MCHC: 32.4 g/dL (ref 30.0–36.0)
MCV: 118.4 fL — ABNORMAL HIGH (ref 80.0–100.0)
Platelets: 656 K/uL — ABNORMAL HIGH (ref 150–400)
RBC: 3.1 MIL/uL — ABNORMAL LOW (ref 3.87–5.11)
RDW: 18 % — ABNORMAL HIGH (ref 11.5–15.5)
WBC: 6.2 K/uL (ref 4.0–10.5)
nRBC: 0 % (ref 0.0–0.2)

## 2024-05-17 LAB — URINALYSIS, ROUTINE W REFLEX MICROSCOPIC
Bilirubin Urine: NEGATIVE
Glucose, UA: NEGATIVE mg/dL
Hgb urine dipstick: NEGATIVE
Ketones, ur: NEGATIVE mg/dL
Leukocytes,Ua: NEGATIVE
Nitrite: NEGATIVE
Protein, ur: NEGATIVE mg/dL
Specific Gravity, Urine: 1.003 — ABNORMAL LOW (ref 1.005–1.030)
pH: 7 (ref 5.0–8.0)

## 2024-05-17 LAB — RAPID URINE DRUG SCREEN, HOSP PERFORMED
Amphetamines: NOT DETECTED
Barbiturates: NOT DETECTED
Benzodiazepines: NOT DETECTED
Cocaine: NOT DETECTED
Opiates: NOT DETECTED
Tetrahydrocannabinol: NOT DETECTED

## 2024-05-17 LAB — I-STAT CHEM 8, ED
BUN: 16 mg/dL (ref 8–23)
Calcium, Ion: 0.99 mmol/L — ABNORMAL LOW (ref 1.15–1.40)
Chloride: 108 mmol/L (ref 98–111)
Creatinine, Ser: 4.1 mg/dL — ABNORMAL HIGH (ref 0.44–1.00)
Glucose, Bld: 133 mg/dL — ABNORMAL HIGH (ref 70–99)
HCT: 36 % (ref 36.0–46.0)
Hemoglobin: 12.2 g/dL (ref 12.0–15.0)
Potassium: 4.8 mmol/L (ref 3.5–5.1)
Sodium: 136 mmol/L (ref 135–145)
TCO2: 25 mmol/L (ref 22–32)

## 2024-05-17 LAB — APTT: aPTT: 24 s (ref 24–36)

## 2024-05-17 LAB — ETHANOL: Alcohol, Ethyl (B): <15 mg/dL (ref <15)

## 2024-05-17 LAB — PROTIME-INR
INR: 1.1 (ref 0.8–1.2)
Prothrombin Time: 14.5 s (ref 11.4–15.2)

## 2024-05-17 NOTE — ED Notes (Signed)
 Patient transported to MRI

## 2024-05-17 NOTE — Discharge Instructions (Signed)
 Please follow-up with your family doctor in the office.  I have placed a referral to the neurologist that should call you to try and set up an appointment.  Please return for recurrent or persistent symptoms especially if you also have weakness.

## 2024-05-17 NOTE — ED Notes (Signed)
 Patient transported to CT

## 2024-05-17 NOTE — ED Notes (Signed)
2 unsuccessful IV attempts.  RN notified.

## 2024-05-17 NOTE — ED Notes (Signed)
 CCMD contacted to place pt cardiac monitoring.

## 2024-05-17 NOTE — ED Provider Notes (Signed)
 [place] EMERGENCY DEPARTMENT AT [facility] Provider Note   CSN: [id number] Arrival date & time: 05/16/24  2351     Patient presents with: Numbness (/)   Kristina Osborne is a 73 y.o. female.   73 yo F with a chief complaints of right sided facial numbness.  She said she was lying in bed and she realized that the right side of her face felt numb.  She started then trying to figure out if she had weakness on 1 side of the face or the other and thought maybe she had some droop on the left side.  She called her sister and via video thought that maybe the left side of the face was drooping.  This is since resolved.  Lasted for about 25 minutes.  She denied one-sided numbness or weakness otherwise.  Denied difficulty with swallowing.  During the time she I was trying to say the alphabet and she felt like she had trouble with LMNOP.  She has had some tingling to her left arm and right hand.  This seemed to come and gone.  Shortly lift both of them.  No weakness noted.  She has been off of her Plavix  due to her recent GI bleeding that brought her into the hospital.  She also has chronic thrombocytopenia that is managed with medications that was off that medication as well because of her blood transfusion.          Prior to Admission medications   Medication Sig Start Date End Date Taking? Authorizing Provider  acetaminophen  (TYLENOL ) 500 MG tablet Take 1,000 mg by mouth every 6 (six) hours as needed for headache (pain.).    [provider]  amoxicillin  (AMOXIL ) 500 MG tablet Take 4 tablets (2,000 mg total) by mouth as directed. 1 hour prior to dental work including cleanings Patient not taking: Reported on 05/15/2024 12/03/23   Sebastian Lamarr SAUNDERS, PA-C  famotidine  (PEPCID ) 40 MG tablet TAKE 1 TABLET(40 MG) BY MOUTH DAILY 05/11/24   Dorsey, John T IV, MD  folic acid  (FOLVITE ) 1 MG tablet TAKE 1 TABLET(1 MG) BY MOUTH DAILY 11/21/23   Dorsey, John T IV, MD  furosemide   (LASIX ) 40 MG tablet Take 1 tablet (40 mg total) by mouth as needed for fluid or edema. Patient not taking: Reported on 05/15/2024 12/03/23 04/22/24  Sebastian Lamarr SAUNDERS, PA-C  Glucosamine-Chondroitin (COSAMIN DS PO) Take 1 tablet by mouth daily. Patient not taking: Reported on 05/15/2024    [provider]  hydroxyurea  (HYDREA ) 500 MG capsule Take 2 capsules (1,000 mg total) by mouth 2 (two) times daily. May take with food to minimize GI side effects. 01/28/24   Dorsey, John T IV, MD  Lycopene 10 MG CAPS Take 10 mg by mouth daily. Patient not taking: Reported on 05/15/2024    [provider]  sucralfate  (CARAFATE ) 1 g tablet TAKE 1 TABLET(1 GRAM) BY MOUTH FOUR TIMES DAILY AT BEDTIME WITH MEALS 08/31/23   Dorsey, John T IV, MD    Allergies: Aspirin and Sulfa antibiotics    Review of Systems  Updated Vital Signs BP 107/78   Pulse 70   Temp 98.4 F (36.9 C) (Oral)   Resp 18   LMP  (LMP Unknown)   SpO2 99%   Physical Exam Vitals and nursing note reviewed.  Constitutional:      General: She is not in acute distress.    Appearance: She is well-developed. She is not diaphoretic.  HENT:  Head: Normocephalic and atraumatic.  Eyes:     Pupils: Pupils are equal, round, and reactive to light.  Cardiovascular:     Rate and Rhythm: Normal rate and regular rhythm.     Heart sounds: No murmur heard.    No friction rub. No gallop.  Pulmonary:     Effort: Pulmonary effort is normal.     Breath sounds: No wheezing or rales.  Abdominal:     General: There is no distension.     Palpations: Abdomen is soft.     Tenderness: There is no abdominal tenderness.  Musculoskeletal:        General: No tenderness.     Cervical back: Normal range of motion and neck supple.  Skin:    General: Skin is warm and dry.  Neurological:     Mental Status: She is alert and oriented to person, place, and time.     GCS: GCS eye subscore is 4. GCS verbal subscore is 5. GCS motor subscore is 6.      Cranial Nerves: Cranial nerves 2-12 are intact.     Sensory: Sensation is intact.     Motor: Motor function is intact.     Coordination: Coordination is intact.     Comments: Benign neurologic exam.  Psychiatric:        Behavior: Behavior normal.     (all labs ordered are listed, but only abnormal results are displayed) Labs Reviewed  CBC - Abnormal; Notable for the following components:      Result Value   RBC 3.10 (*)    Hemoglobin 11.9 (*)    MCV 118.4 (*)    MCH 38.4 (*)    RDW 18.0 (*)    Platelets 656 (*)    All other components within normal limits  COMPREHENSIVE METABOLIC PANEL WITH GFR - Abnormal; Notable for the following components:   Glucose, Bld 109 (*)    All other components within normal limits  URINALYSIS, ROUTINE W REFLEX MICROSCOPIC - Abnormal; Notable for the following components:   Color, Urine COLORLESS (*)    Specific Gravity, Urine 1.003 (*)    All other components within normal limits  I-STAT CHEM 8, ED - Abnormal; Notable for the following components:   Creatinine, Ser 4.10 (*)    Glucose, Bld 133 (*)    Calcium , Ion 0.99 (*)    All other components within normal limits  ETHANOL  PROTIME-INR  APTT  DIFFERENTIAL  RAPID URINE DRUG SCREEN, HOSP PERFORMED    EKG: None  Radiology: MR BRAIN WO CONTRAST Result Date: 05/17/2024 CLINICAL DATA:  73 year old female with neurologic deficit. TIA. Right facial droop, and onset of headache. Recently stopped Plavix . EXAM: MRI HEAD WITHOUT CONTRAST TECHNIQUE: Multiplanar, multiecho pulse sequences of the brain and surrounding structures were obtained without intravenous contrast. COMPARISON:  Head CT 0123 hours today and earlier. FINDINGS: Brain: Normal cerebral volume for age. No restricted diffusion to suggest acute infarction. No midline shift, mass effect, evidence of mass lesion, ventriculomegaly, extra-axial collection or acute intracranial hemorrhage. Cervicomedullary junction and pituitary are within  normal limits. Largely normal for age gray and white matter signal throughout the brain. Mild for age scattered nonspecific cerebral white matter T2 and FLAIR hyperintensity. No cortical encephalomalacia. No chronic cerebral blood products (SWI). Deep gray nuclei, brainstem, and cerebellum appear normal. Vascular: Major intracranial vascular flow voids are preserved. Skull and upper cervical spine: Visualized bone marrow signal is within normal limits. Negative for age visible cervical spine. Sinuses/Orbits: Negative;  minor paranasal sinus mucosal thickening is chronic. Other: Visible internal auditory structures appear normal. Mastoids are clear. Normal stylomastoid foramina, visible scalp and face. IMPRESSION: No acute intracranial abnormality. Essentially normal for age noncontrast MRI appearance of the brain. Electronically Signed   By: VEAR Hurst M.D.   On: 05/17/2024 06:37   CT HEAD WO CONTRAST Result Date: 05/17/2024 EXAM: CT HEAD WITHOUT CONTRAST 05/17/2024 01:28:34 AM TECHNIQUE: CT of the head was performed without the administration of intravenous contrast. Automated exposure control, iterative reconstruction, and/or weight based adjustment of the mA/kV was utilized to reduce the radiation dose to as low as reasonably achievable. COMPARISON: 08/31/2023 CLINICAL HISTORY: Neuro deficit, acute, stroke suspected. FINDINGS: BRAIN AND VENTRICLES: No acute hemorrhage. No evidence of acute infarct. No hydrocephalus. No extra-axial collection. No mass effect or midline shift. ORBITS: No acute abnormality. SINUSES: No acute abnormality. SOFT TISSUES AND SKULL: No acute soft tissue abnormality. No skull fracture. IMPRESSION: 1. No acute intracranial abnormality. Electronically signed by: Pinkie Pebbles MD 05/17/2024 01:30 AM EDT RP Workstation: [id number]     Procedures   Medications Ordered in the ED - No data to display                                  Medical Decision Making Amount and/or Complexity  of Data Reviewed Labs: ordered. Radiology: ordered.   73 yo F with a chief complaints of right-sided facial numbness and some difficulty with rapid speech.  This lasted for about 25 minutes and resolved.  She states she had some transient numbness to her left arm and right hand earlier in the week.  She described that the numbness is a sensation she could feel.  On my calculation of an ABCD2 score is on the low side.  Will obtain a CT of the head.  Will discuss with neurology.  CT of the head negative for acute intracranial pathology.  Initial i-STAT labs with an AKI though formal lab is normal.   Discussed with Dr. Lindzen.  Recommends MRI.  If negative felt okay for outpatient follow-up.  MRI is resulted and is negative.  Will discharge home.  PCP follow-up.  7:13 AM:  I have discussed the diagnosis/risks/treatment options with the patient.  Evaluation and diagnostic testing in the emergency department does not suggest an emergent condition requiring admission or immediate intervention beyond what has been performed at this time.  They will follow up with PCP, neuro. We also discussed returning to the ED immediately if new or worsening sx occur. We discussed the sx which are most concerning (e.g., sudden worsening pain, fever, inability to tolerate by mouth) that necessitate immediate return. Medications administered to the patient during their visit and any new prescriptions provided to the patient are listed below.  Medications given during this visit Medications - No data to display   The patient appears reasonably screen and/or stabilized for discharge and I doubt any other medical condition or other [facility] requiring further screening, evaluation, or treatment in the ED at this time prior to discharge.       Final diagnoses:  Facial numbness    ED Discharge Orders          Ordered    Ambulatory referral to Neurology       Comments: Transient numbness   05/17/24 0652                Emil Share, DO 05/17/24 [phone]

## 2024-05-17 NOTE — ED Notes (Signed)
 ED Provider at bedside.

## 2024-05-19 ENCOUNTER — Other Ambulatory Visit: Payer: Self-pay | Admitting: *Deleted

## 2024-05-19 DIAGNOSIS — E7849 Other hyperlipidemia: Secondary | ICD-10-CM

## 2024-05-20 ENCOUNTER — Inpatient Hospital Stay

## 2024-05-20 ENCOUNTER — Telehealth: Payer: Self-pay | Admitting: *Deleted

## 2024-05-20 ENCOUNTER — Telehealth: Payer: Self-pay

## 2024-05-20 ENCOUNTER — Other Ambulatory Visit: Payer: Self-pay | Admitting: Physician Assistant

## 2024-05-20 ENCOUNTER — Other Ambulatory Visit: Payer: Self-pay | Admitting: Hematology and Oncology

## 2024-05-20 ENCOUNTER — Other Ambulatory Visit: Payer: Self-pay

## 2024-05-20 DIAGNOSIS — D473 Essential (hemorrhagic) thrombocythemia: Secondary | ICD-10-CM

## 2024-05-20 DIAGNOSIS — D500 Iron deficiency anemia secondary to blood loss (chronic): Secondary | ICD-10-CM

## 2024-05-20 DIAGNOSIS — E7849 Other hyperlipidemia: Secondary | ICD-10-CM

## 2024-05-20 LAB — LIPID PANEL
Cholesterol: 157 mg/dL (ref 0–200)
HDL: 55 mg/dL (ref >40)
LDL Cholesterol: 82 mg/dL (ref 0–99)
Total CHOL/HDL Ratio: 2.8 ratio
Triglycerides: 98 mg/dL (ref <150)
VLDL: 20 mg/dL (ref 0–40)

## 2024-05-20 LAB — CBC WITH DIFFERENTIAL (CANCER CENTER ONLY)
Abs Immature Granulocytes: 0.01 K/uL (ref 0.00–0.07)
Abs Immature Granulocytes: 0.01 K/uL (ref 0.00–0.07)
Basophils Absolute: 0 K/uL (ref 0.0–0.1)
Basophils Absolute: 0 K/uL (ref 0.0–0.1)
Basophils Relative: 1 %
Basophils Relative: 1 %
Eosinophils Absolute: 0 K/uL (ref 0.0–0.5)
Eosinophils Absolute: 0 K/uL (ref 0.0–0.5)
Eosinophils Relative: 0 %
Eosinophils Relative: 0 %
HCT: 35.5 % — ABNORMAL LOW (ref 36.0–46.0)
HCT: 36.5 % (ref 36.0–46.0)
Hemoglobin: 11.7 g/dL — ABNORMAL LOW (ref 12.0–15.0)
Hemoglobin: 12.1 g/dL (ref 12.0–15.0)
Immature Granulocytes: 0 %
Immature Granulocytes: 0 %
Lymphocytes Relative: 15 %
Lymphocytes Relative: 19 %
Lymphs Abs: 0.6 K/uL — ABNORMAL LOW (ref 0.7–4.0)
Lymphs Abs: 0.9 K/uL (ref 0.7–4.0)
MCH: 37.7 pg — ABNORMAL HIGH (ref 26.0–34.0)
MCH: 38.3 pg — ABNORMAL HIGH (ref 26.0–34.0)
MCHC: 33 g/dL (ref 30.0–36.0)
MCHC: 33.2 g/dL (ref 30.0–36.0)
MCV: 114.5 fL — ABNORMAL HIGH (ref 80.0–100.0)
MCV: 115.5 fL — ABNORMAL HIGH (ref 80.0–100.0)
Monocytes Absolute: 0.3 K/uL (ref 0.1–1.0)
Monocytes Absolute: 0.4 K/uL (ref 0.1–1.0)
Monocytes Relative: 8 %
Monocytes Relative: 9 %
Neutro Abs: 3 K/uL (ref 1.7–7.7)
Neutro Abs: 3.2 K/uL (ref 1.7–7.7)
Neutrophils Relative %: 76 %
Neutrophils Relative %: 71 %
Platelet Count: 1017 K/uL (ref 150–400)
Platelet Count: 1085 K/uL (ref 150–400)
RBC: 3.1 MIL/uL — ABNORMAL LOW (ref 3.87–5.11)
RBC: 3.16 MIL/uL — ABNORMAL LOW (ref 3.87–5.11)
RDW: 17.8 % — ABNORMAL HIGH (ref 11.5–15.5)
RDW: 17.8 % — ABNORMAL HIGH (ref 11.5–15.5)
WBC Count: 4 K/uL (ref 4.0–10.5)
WBC Count: 4.6 K/uL (ref 4.0–10.5)
nRBC: 0 % (ref 0.0–0.2)
nRBC: 0 % (ref 0.0–0.2)

## 2024-05-20 LAB — CMP (CANCER CENTER ONLY)
ALT: 12 U/L (ref 0–44)
AST: 22 U/L (ref 15–41)
Albumin: 4.3 g/dL (ref 3.5–5.0)
Alkaline Phosphatase: 55 U/L (ref 38–126)
Anion gap: 5 (ref 5–15)
BUN: 12 mg/dL (ref 8–23)
CO2: 28 mmol/L (ref 22–32)
Calcium: 9.7 mg/dL (ref 8.9–10.3)
Chloride: 107 mmol/L (ref 98–111)
Creatinine: 0.8 mg/dL (ref 0.44–1.00)
GFR, Estimated: >60 mL/min (ref >60)
Glucose, Bld: 125 mg/dL — ABNORMAL HIGH (ref 70–99)
Potassium: 4 mmol/L (ref 3.5–5.1)
Sodium: 140 mmol/L (ref 135–145)
Total Bilirubin: 0.4 mg/dL (ref 0.0–1.2)
Total Protein: 6.7 g/dL (ref 6.5–8.1)

## 2024-05-20 LAB — FERRITIN: Ferritin: 19 ng/mL (ref 11–307)

## 2024-05-20 NOTE — Telephone Encounter (Signed)
 Ms. Pacetti platelets today 1017 K with first test then repeat lab was 1085 K. Patient was extremely worried about possibility of clot with elevated platelets. Ms.Thayil, PA advised patient that she could take a low dose ASA. Patient stated she could not take ASA due to past bleeding episodes.   Ms Baksh contacted office after arriving home to inform Ms. Thayil that she had taken a plavix  75 mg that she had at home. Ms. Neomi informed.  Per Ms. Thayil,once all labs from today result, they will be discussed with Ms. Lilton.  Ms. Haynes informed and verbalized understanding.

## 2024-05-20 NOTE — Telephone Encounter (Signed)
 Copied from CRM [id number]. Topic: Clinical - Medication Question >> May 20, 2024  9:19 AM Emylou G wrote: Reason for CRM: Patient called: Muscle spasm from neck to shoulders .. said was prescribed methocarbamol /robaxin .. wants to know if she can get this again.SABRA

## 2024-05-20 NOTE — Progress Notes (Signed)
 CRITICAL VALUE STICKER  CRITICAL VALUE: Platelet 1017  RECEIVER (on-site recipient of call): Rosina   DATE & TIME NOTIFIED: 05/20/24 @ 11:48  MESSENGER (representative from lab): Almarie   MD NOTIFIED: Johnston, [place]  TIME OF NOTIFICATION: 11:48

## 2024-05-21 ENCOUNTER — Telehealth: Payer: Self-pay

## 2024-05-21 ENCOUNTER — Telehealth: Payer: Self-pay | Admitting: *Deleted

## 2024-05-21 NOTE — Telephone Encounter (Signed)
 Johnston, patient will be scheduled as soon as possible.  Auth Submission: NO AUTH NEEDED Site of care: Site of care: [facility] Payer: Medicare A/B with AARP supplement Medication & CPT/J Code(s) submitted: Feraheme (ferumoxytol ) [id number] Diagnosis Code:  Route of submission (phone, fax, portal):  Phone # Fax # Auth type: Buy/Bill PB Units/visits requested: 510mg  x 1 dose Reference number:  Approval from: 05/21/24 to 08/27/24

## 2024-05-21 NOTE — Telephone Encounter (Signed)
 TCT patient regarding labs from yesterday. Spoke with her. Advised that her iron  levels are low and that could contribute to her elevated platelet count. She is scheduled to get her 1st of 2 Fereheme infusions on 05/29/24 and we will continue to monitor her platelet counts She is continuing to take her Hydrea  as ordered though she states she did take an extra Hydrea  yesterday and a plavix  though the plavix  has been discontinued. She is worried that with her platelet count so high, that she is at risk for a stroke. Reassured that we will be monitoring her closely. Advised to call with any questions or concerns.

## 2024-05-23 ENCOUNTER — Telehealth: Payer: Self-pay | Admitting: *Deleted

## 2024-05-23 ENCOUNTER — Ambulatory Visit (HOSPITAL_COMMUNITY)
Admission: RE | Admit: 2024-05-23 | Discharge: 2024-05-23 | Disposition: A | Discharge status: Home / Self Care | Source: Ambulatory Visit | Attending: Physician Assistant | Admitting: Physician Assistant

## 2024-05-23 VITALS — BP 106/64 | HR 73 | Temp 97.9°F | Resp 18

## 2024-05-23 DIAGNOSIS — D500 Iron deficiency anemia secondary to blood loss (chronic): Secondary | ICD-10-CM | POA: Insufficient documentation

## 2024-05-23 MED ORDER — METHYLPREDNISOLONE SODIUM SUCC 125 MG IJ SOLR: 125.0000 mg | Freq: Once | INTRAMUSCULAR | Status: DC | PRN

## 2024-05-23 MED ORDER — FAMOTIDINE IN NACL 20-0.9 MG/50ML-% IV SOLN: 20.0000 mg | Freq: Once | INTRAVENOUS | Status: DC | PRN

## 2024-05-23 MED ORDER — ALBUTEROL SULFATE HFA 108 (90 BASE) MCG/ACT IN AERS: 2.0000 | INHALATION_SPRAY | Freq: Once | RESPIRATORY_TRACT | Status: DC | PRN

## 2024-05-23 MED ORDER — SODIUM CHLORIDE 0.9 % IV SOLN: Freq: Once | INTRAVENOUS | Status: DC | PRN

## 2024-05-23 MED ORDER — DIPHENHYDRAMINE HCL 50 MG/ML IJ SOLN: 50.0000 mg | Freq: Once | INTRAMUSCULAR | Status: DC | PRN

## 2024-05-23 MED ORDER — EPINEPHRINE 0.3 MG/0.3ML IJ SOAJ: 0.3000 mg | Freq: Once | INTRAMUSCULAR | Status: DC | PRN

## 2024-05-23 MED ORDER — SODIUM CHLORIDE 0.9 % IV SOLN
510.0000 mg | Freq: Once | INTRAVENOUS | Status: AC
  Administered 2024-05-23: 510 mg via INTRAVENOUS
  Filled 2024-05-23: qty 510

## 2024-05-23 NOTE — Progress Notes (Signed)
 Pt is here for her first scheduled dose of feraheme with us . About 50ccs in (total is 117ccs), she started complaining of severe nausea Feraheme was stopped and symptoms began to resolve almost immediately. She is feeling well enough to discharge. VSS and she is tolerating ginger ale and saltines. A different iron  product will need to be ordered and premeds if wanted.  Discussed with pt and message sent to provider

## 2024-05-23 NOTE — Telephone Encounter (Signed)
 Received call from pt. She states she was getting her scheduled iron  infusion and had a reaction about 1/2 through. She experienced severe nausea. The infusion was stopped and her symptoms resolved. Advised that I would let Johnston and Dr. Federico know of this. And then advise next steps. Reminded pt of her lab appt next week. And that we will talk more then once we have her results back. Pt voiced understanding. She does state she feels ok right now.

## 2024-05-26 ENCOUNTER — Telehealth: Payer: Self-pay

## 2024-05-26 ENCOUNTER — Ambulatory Visit: Admitting: Dermatology

## 2024-05-26 MED ORDER — METHOCARBAMOL 500 MG PO TABS: 500.0000 mg | ORAL_TABLET | Freq: Four times a day (QID) | ORAL | 1 refills | Status: DC | PRN

## 2024-05-26 NOTE — Telephone Encounter (Signed)
 Copied from CRM [id number]. Topic: General - Call Back - No Documentation >> May 26, 2024  1:49 PM Timindy P wrote: Reason for CRM: Patient is returning a phone call from the office, There is currently no documentation for what the call is regarding and the patient is unsure as well. Connected with CAL and was advised to send a CRM for a call back. Pt can be reached at Telephone Information: Mobile          [phone]

## 2024-05-26 NOTE — Addendum Note (Signed)
 Addended by: NORLEEN LYNWOOD ORN on: 05/26/2024 05:07 PM   Modules accepted: Orders

## 2024-05-26 NOTE — Telephone Encounter (Signed)
Ok this is done erx 

## 2024-05-27 ENCOUNTER — Other Ambulatory Visit (HOSPITAL_COMMUNITY): Payer: Self-pay

## 2024-05-27 ENCOUNTER — Inpatient Hospital Stay

## 2024-05-27 DIAGNOSIS — D473 Essential (hemorrhagic) thrombocythemia: Secondary | ICD-10-CM | POA: Diagnosis not present

## 2024-05-27 LAB — CMP (CANCER CENTER ONLY)
ALT: 15 U/L (ref 0–44)
AST: 24 U/L (ref 15–41)
Albumin: 4.6 g/dL (ref 3.5–5.0)
Alkaline Phosphatase: 60 U/L (ref 38–126)
Anion gap: 3 — ABNORMAL LOW (ref 5–15)
BUN: 13 mg/dL (ref 8–23)
CO2: 29 mmol/L (ref 22–32)
Calcium: 9.9 mg/dL (ref 8.9–10.3)
Chloride: 103 mmol/L (ref 98–111)
Creatinine: 0.76 mg/dL (ref 0.44–1.00)
GFR, Estimated: >60 mL/min (ref >60)
Glucose, Bld: 100 mg/dL — ABNORMAL HIGH (ref 70–99)
Potassium: 4.5 mmol/L (ref 3.5–5.1)
Sodium: 135 mmol/L (ref 135–145)
Total Bilirubin: 0.5 mg/dL (ref 0.0–1.2)
Total Protein: 6.7 g/dL (ref 6.5–8.1)

## 2024-05-27 LAB — CBC WITH DIFFERENTIAL (CANCER CENTER ONLY)
Abs Immature Granulocytes: 0.02 K/uL (ref 0.00–0.07)
Basophils Absolute: 0 K/uL (ref 0.0–0.1)
Basophils Relative: 1 %
Eosinophils Absolute: 0.2 K/uL (ref 0.0–0.5)
Eosinophils Relative: 3 %
HCT: 36.5 % (ref 36.0–46.0)
Hemoglobin: 12 g/dL (ref 12.0–15.0)
Immature Granulocytes: 0 %
Lymphocytes Relative: 13 %
Lymphs Abs: 0.7 K/uL (ref 0.7–4.0)
MCH: 37.5 pg — ABNORMAL HIGH (ref 26.0–34.0)
MCHC: 32.9 g/dL (ref 30.0–36.0)
MCV: 114.1 fL — ABNORMAL HIGH (ref 80.0–100.0)
Monocytes Absolute: 0.3 K/uL (ref 0.1–1.0)
Monocytes Relative: 6 %
Neutro Abs: 4.4 K/uL (ref 1.7–7.7)
Neutrophils Relative %: 77 %
Platelet Count: 439 K/uL — ABNORMAL HIGH (ref 150–400)
RBC: 3.2 MIL/uL — ABNORMAL LOW (ref 3.87–5.11)
RDW: 18.1 % — ABNORMAL HIGH (ref 11.5–15.5)
WBC Count: 5.7 K/uL (ref 4.0–10.5)
nRBC: 0 % (ref 0.0–0.2)

## 2024-05-27 LAB — FERRITIN: Ferritin: 229 ng/mL (ref 11–307)

## 2024-05-27 NOTE — Telephone Encounter (Signed)
 My apologies, I had called patient but this was in regards to her spouse. That documentation is within his chart

## 2024-05-27 NOTE — Telephone Encounter (Signed)
 I don't see any documentation of who could have called her.

## 2024-05-28 ENCOUNTER — Telehealth: Payer: Self-pay | Admitting: *Deleted

## 2024-05-28 NOTE — Telephone Encounter (Signed)
 TCT patient regarding recent lab results.  Spoke with her. Advised that her labs look good from yesterday. Platelets are in normal range, HGB is steady @ 12  Advised that Ferritin was up but she just got almost a full dose of IV iron  last week before she started to have an allergic reaction. Advised we will check her labs in 2 weeks as scheduled and for her to call if she has any questions or concerns. She voiced understanding to the above.

## 2024-05-29 ENCOUNTER — Ambulatory Visit (INDEPENDENT_AMBULATORY_CARE_PROVIDER_SITE_OTHER): Admitting: Dermatology

## 2024-05-29 ENCOUNTER — Ambulatory Visit: Admitting: Dermatology

## 2024-05-29 ENCOUNTER — Encounter: Payer: Self-pay | Admitting: Dermatology

## 2024-05-29 ENCOUNTER — Ambulatory Visit

## 2024-05-29 VITALS — BP 91/67 | HR 66

## 2024-05-29 DIAGNOSIS — L821 Other seborrheic keratosis: Secondary | ICD-10-CM

## 2024-05-29 DIAGNOSIS — L989 Disorder of the skin and subcutaneous tissue, unspecified: Secondary | ICD-10-CM

## 2024-05-29 DIAGNOSIS — L906 Striae atrophicae: Secondary | ICD-10-CM

## 2024-05-29 DIAGNOSIS — L908 Other atrophic disorders of skin: Secondary | ICD-10-CM

## 2024-05-29 MED ORDER — TRETINOIN 0.05 % EX CREA: TOPICAL_CREAM | Freq: Every day | CUTANEOUS | 4 refills | Status: AC

## 2024-05-29 NOTE — Progress Notes (Signed)
   Follow-Up Visit   Subjective  Kristina Osborne is a 73 y.o. female who presents for the following: Rash  Patient present today for follow up visit for rash. Patient was last evaluated on 12/25/22. At this visit patient was prescribed tretinoin  0.05% and was advised to apply 3 nights a week. Patient reports sxs are better. Patient denies medication changes.  Today the patient is here for spot check on her forehead and this has spot popped up 3 months ago.   The following portions of the chart were reviewed this encounter and updated as appropriate: medications, allergies, medical history  Review of Systems:  No other skin or systemic complaints except as noted in HPI or Assessment and Plan.  Objective  Well appearing patient in no apparent distress; mood and affect are within normal limits.  A full examination was performed including scalp, head, eyes, ears, nose, lips, neck, chest, axillae, abdomen, back, buttocks, bilateral upper extremities, bilateral lower extremities, hands, feet, fingers, toes, fingernails, and toenails. All findings within normal limits unless otherwise noted below.   A focused examination was performed of the following areas: Legs, face and hips  Relevant exam findings are noted in the Assessment and Plan.           Assessment & Plan   Seborrheic keratosis Benign seborrheic keratosis, brown and slightly elevated, common and non-dangerous, may increase in size over time. - Monitor for changes in size or symptoms such as itchiness. - Offer cryotherapy if the lesion becomes bothersome or unsightly.  Skin changes due to aging Age-related skin changes including crepey skin and age spots, common with aging and not harmful. Tretinoin  improves skin texture and appearance but may cause dryness if overused. La Roche-Posay Lipikar lotion with urea recommended for hydration and exfoliation. - Continue tretinoin  application three nights a week to improve skin  texture and appearance, avoiding excessive dryness. - Use La Roche-Posay Lipikar lotion with urea for hydration and exfoliation, applying a quarter-sized amount to the legs and mixing with a bean-sized amount of tretinoin  for enhanced effect. - Provide samples of Lipikar lotion and a coupon for purchase.  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 Gentle exfoliation advised to aid in skin turnover. - Continue tretinoin  to improve striae appearance, mixing with a moisturizer to prevent excessive dryness. - Advise gentle exfoliation with a skin brush no more than once a week to aid in skin turnover.   STRIAE   Related Medications tretinoin  (RETIN-A ) 0.05 % cream Apply topically at bedtime.  Return in about 1 year (around 05/29/2025) for spot check.  I, Doyce Pan, CMA, am acting as scribe for [employer], DO.   Documentation: I have reviewed the above documentation for accuracy and completeness, and I agree with the above.  Delon Lenis, DO

## 2024-05-29 NOTE — Patient Instructions (Addendum)
 VISIT SUMMARY:  Today, you were seen for a dermatological evaluation of skin changes and management of stretch marks. We discussed your concerns about a skin spot that has become elevated and brown, as well as age spots that have been puffing up. You also mentioned the use of tretinoin  for stretch marks and a recent allergic reaction to ferritin iron  infusions. 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  YOUR PLAN:  -SEBORRHEIC KERATOSIS: Seborrheic keratosis is a common, non-dangerous skin growth that may increase in size over time. You should monitor the spot for any changes in size or symptoms such as itchiness. If it becomes bothersome or unsightly, cryotherapy can be offered to remove it.  -SKIN CHANGES DUE TO AGING: Age-related skin changes, including crepey skin and age spots, are common and not harmful. Tretinoin  can improve skin texture and appearance but may cause dryness if overused. You should continue applying tretinoin  three nights a week and use La Roche-Posay Lipikar lotion with urea for hydration and exfoliation. Apply a quarter-sized amount of the lotion to your legs and mix it with a bean-sized amount of tretinoin  for enhanced effect. Samples of Lipikar lotion and a coupon for purchase were provided.  -STRIAE (STRETCH MARKS): Striae, or stretch marks, are a chronic condition related to skin stretching. Continue using tretinoin  to improve their appearance, mixing it with a moisturizer to prevent excessive dryness. Gentle exfoliation with a skin brush no more than once a week is advised to aid in skin turnover.  INSTRUCTIONS:  Please monitor the seborrheic keratosis for any changes and continue your current skin care regimen as discussed. If you experience any new symptoms or have concerns, schedule a follow-up appointment. Important Information  Due to recent changes in healthcare laws, you may see results of  your pathology and/or laboratory studies on MyChart before the doctors have had a chance to review them. We understand that in some cases there may be results that are confusing or concerning to you. Please understand that not all results are received at the same time and often the doctors may need to interpret multiple results in order to provide you with the best plan of care or course of treatment. Therefore, we ask that you please give us  2 business days to thoroughly review all your results before contacting the office for clarification. Should we see a critical lab result, you will be contacted sooner.   If You Need Anything After Your Visit  If you have any questions or concerns for your doctor, please call our main line at [phone] If no one answers, please leave a voicemail as directed and we will return your call as soon as possible. Messages left after 4 pm will be answered the following business day.   You may also send us  a message via MyChart. We typically respond to MyChart messages within 1-2 business days.  For prescription refills, please ask your pharmacy to contact our office. Our fax number is [phone].  If you have an urgent issue when the clinic is closed that cannot wait until the next business day, you can page your doctor at the number below.    Please note that while we do our best to be available for urgent issues outside of office hours, we are not available 24/7.   If you have an urgent issue and are unable to reach us , you may choose to seek medical care at your doctor's office, retail clinic,  urgent care center, or emergency room.  If you have a medical emergency, please immediately call 911 or go to the emergency department. In the event of inclement weather, please call our main line at [phone] for an update on the status of any delays or closures.  Dermatology Medication Tips: Please keep the boxes that topical medications come in in order to help  keep track of the instructions about where and how to use these. Pharmacies typically print the medication instructions only on the boxes and not directly on the medication tubes.   If your medication is too expensive, please contact our office at [phone] or send us  a message through MyChart.   We are unable to tell what your co-pay for medications will be in advance as this is different depending on your insurance coverage. However, we may be able to find a substitute medication at lower cost or fill out paperwork to get insurance to cover a needed medication.   If a prior authorization is required to get your medication covered by your insurance company, please allow us  1-2 business days to complete this process.  Drug prices often vary depending on where the prescription is filled and some pharmacies may offer cheaper prices.  The website www.goodrx.com contains coupons for medications through different pharmacies. The prices here do not account for what the cost may be with help from insurance (it may be cheaper with your insurance), but the website can give you the price if you did not use any insurance.  - You can print the associated coupon and take it with your prescription to the pharmacy.  - You may also stop by our office during regular business hours and pick up a GoodRx coupon card.  - If you need your prescription sent electronically to a different pharmacy, notify our office through [facility] or by phone at [phone]

## 2024-06-03 ENCOUNTER — Ambulatory Visit

## 2024-06-03 VITALS — Ht 66.0 in | Wt 142.0 lb

## 2024-06-03 DIAGNOSIS — Z0000 Encounter for general adult medical examination without abnormal findings: Secondary | ICD-10-CM | POA: Diagnosis not present

## 2024-06-03 NOTE — Progress Notes (Addendum)
 Subjective:  Please attest and cosign this visit due to patients primary care provider not being in the office at the time the visit was completed.  (Pt of Dr Lynwood Rush)   Kristina Osborne is a 73 y.o. who presents for a Medicare Wellness preventive visit.  As a reminder, Annual Wellness Visits don't include a physical exam, and some assessments may be limited, especially if this visit is performed virtually. We may recommend an in-person follow-up visit with your provider if needed.  Visit Complete: Virtual I connected with  Kristina Osborne on 06/10/24 by a audio enabled telemedicine application and verified that I am speaking with the correct person using two identifiers.  Patient Location: Home  Provider Location: Office/Clinic  I discussed the limitations of evaluation and management by telemedicine. The patient expressed understanding and agreed to proceed.  Vital Signs: Because this visit was a virtual/telehealth visit, some criteria may be missing or patient reported. Any vitals not documented were not able to be obtained and vitals that have been documented are patient reported.  VideoDeclined- This patient declined Librarian, academic. Therefore the visit was completed with audio only.  Persons Participating in Visit: Patient.  AWV Questionnaire: No: Patient Medicare AWV questionnaire was not completed prior to this visit.  Cardiac Risk Factors include: advanced age (>48men, >21 women);hypertension     Objective:    Today's Vitals   06/03/24 1139  Weight: 142 lb (64.4 kg)  Height: 5' 6 (1.676 m)   Body mass index is 22.92 kg/m.     06/03/2024   11:38 AM 04/22/2024    8:12 PM 04/22/2024    2:56 PM 11/28/2023    7:09 AM 10/24/2023    7:45 AM 10/15/2023    8:20 PM 10/06/2023    8:12 AM  Advanced Directives  Does Patient Have a Medical Advance Directive? Yes  No Yes Yes Yes No  Type of Estate Osborne of [place];Living  will   Healthcare Power of [employer] Power of [employer] Power of [place];Living will   Does patient want to make changes to medical advance directive?    No - Patient declined     Copy of Healthcare Power of Attorney in Chart? No - copy requested   No - copy requested  No - copy requested, Physician notified   Would patient like information on creating a medical advance directive?  No - Patient declined         Current Medications (verified) Outpatient Encounter Medications as of 06/03/2024  Medication Sig   acetaminophen  (TYLENOL ) 500 MG tablet Take 1,000 mg by mouth every 6 (six) hours as needed for headache (pain.).   famotidine  (PEPCID ) 40 MG tablet TAKE 1 TABLET(40 MG) BY MOUTH DAILY   folic acid  (FOLVITE ) 1 MG tablet TAKE 1 TABLET(1 MG) BY MOUTH DAILY   hydroxyurea  (HYDREA ) 500 MG capsule Take 2 capsules (1,000 mg total) by mouth 2 (two) times daily. May take with food to minimize GI side effects.   methocarbamol  (ROBAXIN ) 500 MG tablet Take 1 tablet (500 mg total) by mouth every 6 (six) hours as needed for muscle spasms.   sucralfate  (CARAFATE ) 1 g tablet TAKE 1 TABLET(1 GRAM) BY MOUTH FOUR TIMES DAILY AT BEDTIME WITH MEALS   tretinoin  (RETIN-A ) 0.05 % cream Apply topically at bedtime.   amoxicillin  (AMOXIL ) 500 MG tablet Take 4 tablets (2,000 mg total) by mouth as directed. 1 hour prior to dental work including cleanings (Patient not taking: Reported  on 06/03/2024)   furosemide  (LASIX ) 40 MG tablet Take 1 tablet (40 mg total) by mouth as needed for fluid or edema. (Patient not taking: Reported on 06/03/2024)   Glucosamine-Chondroitin (COSAMIN DS PO) Take 1 tablet by mouth daily. (Patient not taking: Reported on 06/03/2024)   Lycopene 10 MG CAPS Take 10 mg by mouth daily. (Patient not taking: Reported on 06/03/2024)   No facility-administered encounter medications on file as of 06/03/2024.    Allergies (verified) Feraheme [ferumoxytol ], Aspirin, and Sulfa antibiotics    History: Past Medical History:  Diagnosis Date   ABLA (acute blood loss anemia) 04/16/2022   Congenital malformation of intestinal fixation (HCC)    Gastritis    Gastroesophageal reflux disease without esophagitis 09/10/2021   IDA (iron  deficiency anemia)    Mitral valve prolapse    Persistent hyperplasia of thymus    S/P Thymectomy   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 Wendel   Thrombocytosis    Past Surgical History:  Procedure Laterality Date   BIOPSY  06/26/2022   Procedure: BIOPSY;  Surgeon: Stacia Glendia BRAVO, MD;  Location: [facility];  Service: Gastroenterology;;   COLONOSCOPY N/A 04/17/2022   Procedure: COLONOSCOPY;  Surgeon: Albertus Gordy HERO, MD;  Location: [facility];  Service: Gastroenterology;  Laterality: N/A;   COLONOSCOPY N/A 04/24/2024   Procedure: COLONOSCOPY;  Surgeon: Legrand Victory LITTIE DOUGLAS, MD;  Location: [facility];  Service: Gastroenterology;  Laterality: N/A;   COLONOSCOPY WITH PROPOFOL  N/A 06/26/2022   Procedure: COLONOSCOPY WITH PROPOFOL ;  Surgeon: Stacia Glendia BRAVO, MD;  Location: [facility];  Service: Gastroenterology;  Laterality: N/A;   ENTEROSCOPY N/A 04/17/2022   Procedure: ENTEROSCOPY;  Surgeon: Albertus Gordy HERO, MD;  Location: [facility];  Service: Gastroenterology;  Laterality: N/A;   ENTEROSCOPY N/A 06/26/2022   Procedure: ENTEROSCOPY;  Surgeon: Stacia Glendia BRAVO, MD;  Location: [facility];  Service: Gastroenterology;  Laterality: N/A;   ENTEROSCOPY N/A 04/23/2024   Procedure: ENTEROSCOPY;  Surgeon: Legrand Victory LITTIE DOUGLAS, MD;  Location: [facility];  Service: Gastroenterology;  Laterality: N/A;   GIVENS CAPSULE STUDY N/A 06/26/2022   Procedure: GIVENS CAPSULE STUDY;  Surgeon: Stacia Glendia BRAVO, MD;  Location: [facility];  Service: Gastroenterology;  Laterality: N/A;   GIVENS CAPSULE STUDY N/A 04/24/2024   Procedure: IMAGING PROCEDURE, GI TRACT,  INTRALUMINAL, VIA CAPSULE;  Surgeon: Legrand Victory LITTIE DOUGLAS, MD;  Location: [facility];  Service: Gastroenterology;  Laterality: N/A;   HEMOSTASIS CLIP PLACEMENT  04/17/2022   Procedure: HEMOSTASIS CLIP PLACEMENT;  Surgeon: Albertus Gordy HERO, MD;  Location: [facility];  Service: Gastroenterology;;   HEMOSTASIS CLIP PLACEMENT  06/26/2022   Procedure: HEMOSTASIS CLIP PLACEMENT;  Surgeon: Stacia Glendia BRAVO, MD;  Location: [facility];  Service: Gastroenterology;;   HOT HEMOSTASIS N/A 04/17/2022   Procedure: HOT HEMOSTASIS (ARGON PLASMA COAGULATION/BICAP);  Surgeon: Albertus Gordy HERO, MD;  Location: [facility];  Service: Gastroenterology;  Laterality: N/A;   HOT HEMOSTASIS N/A 06/26/2022   Procedure: HOT HEMOSTASIS (ARGON PLASMA COAGULATION/BICAP);  Surgeon: Stacia Glendia BRAVO, MD;  Location: [facility];  Service: Gastroenterology;  Laterality: N/A;   POLYPECTOMY  04/17/2022   Procedure: POLYPECTOMY;  Surgeon: Albertus Gordy HERO, MD;  Location: [facility];  Service: Gastroenterology;;   POLYPECTOMY  06/26/2022   Procedure: POLYPECTOMY;  Surgeon: Stacia Glendia BRAVO, MD;  Location: [facility];  Service: Gastroenterology;;   RIGHT/LEFT HEART CATH AND CORONARY ANGIOGRAPHY N/A 11/01/2023   Procedure: RIGHT/LEFT  HEART CATH AND CORONARY ANGIOGRAPHY;  Surgeon: Darron Deatrice LABOR, MD;  Location: [facility];  Service: Cardiovascular;  Laterality: N/A;   SMALL INTESTINE SURGERY  1994   SUBMUCOSAL TATTOO INJECTION  06/26/2022   Procedure: SUBMUCOSAL TATTOO INJECTION;  Surgeon: Stacia Glendia BRAVO, MD;  Location: [facility];  Service: Gastroenterology;;   THYMECTOMY N/A    TRANSCATHETER MITRAL EDGE TO EDGE REPAIR N/A 11/28/2023   Procedure: TRANSCATHETER MITRAL EDGE TO EDGE REPAIR;  Surgeon: Wendel Lurena POUR, MD;  Location: [facility];  Service: Cardiovascular;  Laterality: N/A;   TRANSESOPHAGEAL ECHOCARDIOGRAM (CATH LAB) N/A 10/24/2023   Procedure: TRANSESOPHAGEAL ECHOCARDIOGRAM;  Surgeon: Mona Vinie BROCKS, MD;  Location: [facility];  Service: Cardiovascular;  Laterality: N/A;   TRANSESOPHAGEAL ECHOCARDIOGRAM (CATH LAB) N/A 11/28/2023   Procedure: TRANSESOPHAGEAL ECHOCARDIOGRAM;  Surgeon: Wendel Lurena POUR, MD;  Location: [facility];  Service: Cardiovascular;  Laterality: N/A;   Family History  Problem Relation Age of Onset   Heart disease Mother    Valvular heart disease Mother    Healthy Sister    Alcoholism Half-Sister    Social History   Socioeconomic History   Marital status: Married    Spouse name: Not on file   Number of children: 0   Years of education: Not on file   Highest education level: Not on file  Occupational History   Not on file  Tobacco Use   Smoking status: Former    Current packs/day: 0.00    Types: Cigarettes    Quit date: 2002    Years since quitting: 23.8   Smokeless tobacco: Never   Tobacco comments:    Started smoking in her early 86's.    Smoked 0.5PP at her heaviest.   Vaping Use   Vaping status: Never Used  Substance and Sexual Activity   Alcohol use: Not Currently   Drug use: Never   Sexual activity: Yes    Partners: Male  Other Topics Concern   Not on file  Social History Narrative   Married   Social Drivers of Health   Financial Resource Strain: Low Risk  (06/03/2024)   Overall Financial Resource Strain (CARDIA)    Difficulty of Paying Living Expenses: Not hard at all  Food Insecurity: No Food Insecurity (06/03/2024)   Hunger Vital Sign    Worried About Running Out of Food in the Last Year: Never true    Ran Out of Food in the Last Year: Never true  Transportation Needs: No Transportation Needs (06/03/2024)   PRAPARE - Administrator, Civil Service (Medical): No    Lack of Transportation (Non-Medical): No  Physical Activity: Sufficiently Active (06/03/2024)   Exercise Vital Sign    Days of Exercise per Week: 7 days    Minutes of Exercise per Session: 60 min  Stress: No Stress Concern Present  (06/03/2024)   [employer] of Occupational Health - Occupational Stress Questionnaire    Feeling of Stress: Not at all  Social Connections: Socially Integrated (06/03/2024)   Social Connection and Isolation Panel    Frequency of Communication with Friends and Family: More than three times a week    Frequency of Social Gatherings with Friends and Family: More than three times a week    Attends Religious Services: More than 4 times per year    Active Member of [employer] or Organizations: Yes    Attends Banker Meetings: More than 4 times per year  Marital Status: Married    Tobacco Counseling Counseling given: Not Answered Tobacco comments: Started smoking in her early 20's. Smoked 0.5PP at her heaviest.     Clinical Intake:  Pre-visit preparation completed: Yes  Pain : No/denies pain     BMI - recorded: 22.92 Nutritional Status: BMI of 19-24  Normal Nutritional Risks: None Diabetes: No  Lab Results  Component Value Date   HGBA1C 4.4 (L) 04/16/2022     How often do you need to have someone help you when you read instructions, pamphlets, or other written materials from your doctor or pharmacy?: 1 - Never  Interpreter Needed?: No  Information entered by :: Kristina Osborne, CMA   Activities of Daily Living     06/03/2024   11:42 AM 04/22/2024    8:12 PM  In your present state of health, do you have any difficulty performing the following activities:  Hearing? 0 0  Vision? 0 0  Difficulty concentrating or making decisions? 0 0  Walking or climbing stairs? 0   Dressing or bathing? 0   Doing errands, shopping? 0 0  Preparing Food and eating ? N   Using the Toilet? N   In the past six months, have you accidently leaked urine? N   Do you have problems with loss of bowel control? N   Managing your Medications? N   Managing your Finances? N   Housekeeping or managing your Housekeeping? N     Patient Care Team: Norleen Lynwood ORN, MD as PCP - General  (Internal Medicine) Thukkani, Arun K, MD as PCP - Structural Heart (Cardiology) Mona Vinie BROCKS, MD as PCP - Cardiology (Cardiology) Waylan Cain, MD as Consulting Physician (Ophthalmology)  I have updated your Care Teams any recent Medical Services you may have received from other providers in the past year.     Assessment:   This is a routine wellness examination for Kristina Osborne.  Hearing/Vision screen Hearing Screening - Comments:: Denies hearing difficulties   Vision Screening - Comments:: Wears rx glasses - up to date with routine eye exams with Cain Waylan   Goals Addressed               This Visit's Progress     Patient Stated (pt-stated)        Patient stated she plans to continue exercising and eating healthy       Depression Screen     06/03/2024   11:44 AM 04/08/2024    4:24 PM 12/20/2023    4:26 PM 10/18/2023   10:05 AM 09/06/2023    1:26 PM 08/09/2023    3:01 PM 06/26/2023    2:56 PM  PHQ 2/9 Scores  PHQ - 2 Score 0 0 0 0 0 0 0  PHQ- 9 Score 0 1 5        Fall Risk     06/03/2024   11:42 AM 04/09/2024   10:08 AM 04/07/2024   10:11 AM 04/04/2024   10:10 AM 04/02/2024   10:04 AM  Fall Risk   Falls in the past year? 1 1 1 1 1   Number falls in past yr: 0 1 1 1 1   Comment 1      Injury with Fall? 0 1 1 1 1   Risk for fall due to :  History of fall(s) History of fall(s) History of fall(s) History of fall(s)  Follow up Falls evaluation completed;Falls prevention discussed Falls evaluation completed Falls evaluation completed Falls evaluation completed Falls evaluation completed  MEDICARE RISK AT HOME:  Medicare Risk at Home Any stairs in or around the home?: Yes (outside) If so, are there any without handrails?: No Home free of loose throw rugs in walkways, pet beds, electrical cords, etc?: Yes Adequate lighting in your home to reduce risk of falls?: Yes Life alert?: No Use of a cane, walker or w/c?: No Grab bars in the bathroom?: No Shower chair or  bench in shower?: No Elevated toilet seat or a handicapped toilet?: No  TIMED UP AND GO:  Was the test performed?  No  Cognitive Function: 6CIT completed        06/03/2024   11:45 AM  6CIT Screen  What Year? 0 points  What month? 0 points  What time? 0 points  Count back from 20 0 points  Months in reverse 0 points  Repeat phrase 0 points  Total Score 0 points    Immunizations Immunization History  Administered Date(s) Administered   Fluad Quad(high Dose 65+) 09/26/2023   Influenza-Unspecified 06/28/2021   PNEUMOCOCCAL CONJUGATE-20 09/07/2021   Tdap 03/03/2020    Screening Tests Health Maintenance  Topic Date Due   COVID-19 Vaccine (1) Never done   Hepatitis C Screening  Never done   Zoster Vaccines- Shingrix (1 of 2) Never done   Mammogram  Never done   DEXA SCAN  Never done   Influenza Vaccine  03/28/2024   Medicare Annual Wellness (AWV)  06/03/2025   DTaP/Tdap/Td (2 - Td or Tdap) 03/03/2030   Colonoscopy  04/24/2034   Pneumococcal Vaccine: 50+ Years  Completed   Meningococcal B Vaccine  Aged Out   Lung Cancer Screening  Discontinued    Health Maintenance Items Addressed:  I have recommended that this patient have a DEXA Scan, Mammogram, and a Shingles vaccine but she declines at this time. I have discussed the risks and benefits of this procedure with her. The patient verbalizes understanding.   Additional Screening:  Vision Screening: Recommended annual ophthalmology exams for early detection of glaucoma and other disorders of the eye. Is the patient up to date with their annual eye exam?  Yes  Who is the provider or what is the name of the office in which the patient attends annual eye exams? [facility]  Dental Screening: Recommended annual dental exams for proper oral hygiene  Community Resource Referral / Chronic Care Management: CRR required this visit?  No   CCM required this visit?  No   Plan:    I have personally reviewed and noted  the following in the patient's chart:   Medical and social history Use of alcohol, tobacco or illicit drugs  Current medications and supplements including opioid prescriptions. Patient is not currently taking opioid prescriptions. Functional ability and status Nutritional status Physical activity Advanced directives List of other physicians Hospitalizations, surgeries, and ER visits in previous 12 months Vitals Screenings to include cognitive, depression, and falls Referrals and appointments  In addition, I have reviewed and discussed with patient certain preventive protocols, quality metrics, and best practice recommendations. A written personalized care plan for preventive services as well as general preventive health recommendations were provided to patient.   Kristina Osborne, CMA   06/10/2024   After Visit Summary: (MyChart) Due to this being a telephonic visit, the after visit summary with patients personalized plan was offered to patient via MyChart   Notes: Nothing significant to report at this time.  Medical screening examination/treatment/procedure(s) were performed by non-physician practitioner and as supervising physician I was  immediately available for consultation/collaboration.  I agree with above. Karlynn Noel, MD

## 2024-06-03 NOTE — Patient Instructions (Addendum)
 Ms. Kozma,  Thank you for taking the time for your Medicare Wellness Visit. I appreciate your continued commitment to your health goals. Please review the care plan we discussed, and feel free to reach out if I can assist you further.  Medicare recommends these wellness visits once per year to help you and your care team stay ahead of potential health issues. These visits are designed to focus on prevention, allowing your provider to concentrate on managing your acute and chronic conditions during your regular appointments.  Please note that Annual Wellness Visits do not include a physical exam. Some assessments may be limited, especially if the visit was conducted virtually. If needed, we may recommend a separate in-person follow-up with your provider.  Ongoing Care Seeing your primary care provider every 3 to 6 months helps us  monitor your health and provide consistent, personalized care.   Referrals If a referral was made during today's visit and you haven't received any updates within two weeks, please contact the referred provider directly to check on the status.  Recommended Screenings:  Health Maintenance  Topic Date Due   COVID-19 Vaccine (1) Never done   Hepatitis C Screening  Never done   Zoster (Shingles) Vaccine (1 of 2) Never done   Breast Cancer Screening  Never done   DEXA scan (bone density measurement)  Never done   Flu Shot  03/28/2024   Medicare Annual Wellness Visit  06/03/2025   DTaP/Tdap/Td vaccine (2 - Td or Tdap) 03/03/2030   Colon Cancer Screening  04/24/2034   Pneumococcal Vaccine for age over 68  Completed   Meningitis B Vaccine  Aged Out   Screening for Lung Cancer  Discontinued       06/03/2024   11:38 AM  Advanced Directives  Does Patient Have a Medical Advance Directive? Yes  Type of Estate agent of [place];Living will  Copy of Healthcare Power of Attorney in Chart? No - copy requested   Advance Care Planning is important  because it: Ensures you receive medical care that aligns with your values, goals, and preferences. Provides guidance to your family and loved ones, reducing the emotional burden of decision-making during critical moments.  Vision: Annual vision screenings are recommended for early detection of glaucoma, cataracts, and diabetic retinopathy. These exams can also reveal signs of chronic conditions such as diabetes and high blood pressure.  Dental: Annual dental screenings help detect early signs of oral cancer, gum disease, and other conditions linked to overall health, including heart disease and diabetes.

## 2024-06-05 ENCOUNTER — Telehealth: Payer: Self-pay

## 2024-06-05 ENCOUNTER — Other Ambulatory Visit (HOSPITAL_COMMUNITY): Payer: Self-pay

## 2024-06-05 NOTE — Telephone Encounter (Signed)
 Pharmacy Patient Advocate Encounter   Received notification from CoverMyMeds that prior authorization for Methocarbamol  500MG  tablets  is required/requested.   Insurance verification completed.   The patient is insured through [facility]   Per test claim: PA required; PA submitted to above mentioned insurance via Latent Key/confirmation #/EOC [id number] Status is pending

## 2024-06-05 NOTE — Telephone Encounter (Signed)
 Pharmacy Patient Advocate Encounter  Received notification from [employer] that Prior Authorization for Methocarbamol  500MG  tablets  has been DENIED.  Full denial letter will be uploaded to the media tab. See denial reason below.   PA #/Case ID/Reference #: [id number]

## 2024-06-08 NOTE — Progress Notes (Unsigned)
 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 Federico  10/26/2021: WBC 6.7, Hgb 13.3, MCV 121.7, Plt 204 03/10/2022: WBC 3.7, Hgb 11.2, MCV 120, Plt 261. Decrease hydroxyurea  to 1000 mg BID due to cytopenias.  06/24/2022-06/27/2022: Admitted with worsening GI bleed. Hydroxyurea  held.  08/01/2022: WBC 4.4, hemoglobin 9.8, MCV 105.1, and platelets of 190 02/26/2023: WBC 7.3, Hgb 12.5, MCV 109.1, Plt 807. Increase hydroxyurea  to 1000 mg daily with 1500 mg every other day 04/27/2023: WBC 4.8, Hgb 12.5, MCV 111.3, Plt 528. Increase hydroxyurea  to 1500 mg daily 06/20/2023: Return dose to hydroxyurea  500 mg twice daily 07/17/2023: increased to 1500 mg hydroxyurea  daily (1000 in PM and 500 in AM)   Interval History:  Kristina Osborne 73 y.o. female with medical history significant for essential thrombocytosis who presents for a follow up visit. The patient's last visit was on 04/10/2024. In the interim since the last visit Kristina Osborne has continued 1500 mg hydroxyurea  daily (1000 in PM and 500 in AM) on T Th Sat Sun and 2000 mg PO daily (1000 mg BID) on MWF.   On exam today Kristina Osborne reports she has been well overall in the interim since our last visit.  She did however have a reaction to her most recent Feraheme.  She notes other than that she has been feeling quite well with good levels of energy and strong appetite.  She is not having any overt signs of bleeding such  as nosebleeds, gum bleeding, or blood in the urine or stool.  She denies any dark stools.  She does have some occasional lightheadedness and dizziness but no shortness of breath.  She reports that she notes that her lightheaded sessions are vague and occur briefly and she has not had any associated falls.  She is currently taking her hydroxyurea  with 4 pills on Monday Wednesday Friday and 3 pills every other day of the week.  Overall she feels quite well and has no additional questions concerns or complaints today.  Full 10 point ROS is otherwise negative.  MEDICAL HISTORY:  Past Medical History:  Diagnosis Date   ABLA (acute blood loss anemia) 04/16/2022   Congenital malformation of intestinal fixation (HCC)    Gastritis    Gastroesophageal reflux disease without esophagitis 09/10/2021   IDA (iron  deficiency anemia)    Mitral valve prolapse    Persistent hyperplasia of thymus    S/P Thymectomy   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 Wendel   Thrombocytosis     SURGICAL HISTORY: Past Surgical History:  Procedure Laterality Date   BIOPSY  06/26/2022   Procedure: BIOPSY;  Surgeon: Stacia Glendia BRAVO, MD;  Location: [facility];  Service: Gastroenterology;;   COLONOSCOPY N/A 04/17/2022   Procedure: COLONOSCOPY;  Surgeon: Albertus Gordy HERO, MD;  Location: [facility];  Service: Gastroenterology;  Laterality: N/A;   COLONOSCOPY N/A 04/24/2024   Procedure: COLONOSCOPY;  Surgeon: Legrand Victory LITTIE DOUGLAS, MD;  Location: [facility];  Service: Gastroenterology;  Laterality: N/A;   COLONOSCOPY WITH PROPOFOL  N/A 06/26/2022   Procedure: COLONOSCOPY WITH PROPOFOL ;  Surgeon: Stacia Glendia BRAVO, MD;  Location: [facility];  Service: Gastroenterology;  Laterality: N/A;   ENTEROSCOPY N/A 04/17/2022   Procedure: ENTEROSCOPY;  Surgeon: Albertus Gordy HERO, MD;  Location: [facility];  Service: Gastroenterology;  Laterality:  N/A;   ENTEROSCOPY N/A 06/26/2022   Procedure: ENTEROSCOPY;  Surgeon: Stacia Glendia BRAVO, MD;  Location: [facility];  Service: Gastroenterology;  Laterality: N/A;   ENTEROSCOPY N/A 04/23/2024   Procedure: ENTEROSCOPY;  Surgeon: Legrand Victory LITTIE DOUGLAS, MD;  Location: [facility];  Service: Gastroenterology;  Laterality: N/A;   GIVENS CAPSULE STUDY N/A 06/26/2022   Procedure: GIVENS CAPSULE STUDY;  Surgeon: Stacia Glendia BRAVO, MD;  Location: [facility];  Service: Gastroenterology;  Laterality: N/A;   GIVENS CAPSULE STUDY N/A 04/24/2024   Procedure: IMAGING PROCEDURE, GI TRACT, INTRALUMINAL, VIA CAPSULE;  Surgeon: Legrand Victory LITTIE DOUGLAS, MD;  Location: [facility];  Service: Gastroenterology;  Laterality: N/A;   HEMOSTASIS CLIP PLACEMENT  04/17/2022   Procedure: HEMOSTASIS CLIP PLACEMENT;  Surgeon: Albertus Gordy HERO, MD;  Location: [facility];  Service: Gastroenterology;;   HEMOSTASIS CLIP PLACEMENT  06/26/2022   Procedure: HEMOSTASIS CLIP PLACEMENT;  Surgeon: Stacia Glendia BRAVO, MD;  Location: [facility];  Service: Gastroenterology;;   HOT HEMOSTASIS N/A 04/17/2022   Procedure: HOT HEMOSTASIS (ARGON PLASMA COAGULATION/BICAP);  Surgeon: Albertus Gordy HERO, MD;  Location: [facility];  Service: Gastroenterology;  Laterality: N/A;   HOT HEMOSTASIS N/A 06/26/2022   Procedure: HOT HEMOSTASIS (ARGON PLASMA COAGULATION/BICAP);  Surgeon: Stacia Glendia BRAVO, MD;  Location: [facility];  Service: Gastroenterology;  Laterality: N/A;   POLYPECTOMY  04/17/2022   Procedure: POLYPECTOMY;  Surgeon: Albertus Gordy HERO, MD;  Location: [facility];  Service: Gastroenterology;;   POLYPECTOMY  06/26/2022   Procedure: POLYPECTOMY;  Surgeon: Stacia Glendia BRAVO, MD;  Location: [facility];  Service: Gastroenterology;;   RIGHT/LEFT HEART CATH AND CORONARY ANGIOGRAPHY N/A 11/01/2023   Procedure: RIGHT/LEFT HEART CATH AND CORONARY ANGIOGRAPHY;  Surgeon: Darron Deatrice LABOR, MD;  Location: [facility];  Service:  Cardiovascular;  Laterality: N/A;   SMALL INTESTINE SURGERY  1994   SUBMUCOSAL TATTOO INJECTION  06/26/2022   Procedure: SUBMUCOSAL TATTOO INJECTION;  Surgeon: Stacia Glendia BRAVO, MD;  Location: [facility];  Service: Gastroenterology;;   THYMECTOMY N/A    TRANSCATHETER MITRAL EDGE TO EDGE REPAIR N/A 11/28/2023   Procedure: TRANSCATHETER MITRAL EDGE TO EDGE REPAIR;  Surgeon: Wendel Lurena POUR, MD;  Location: [facility];  Service: Cardiovascular;  Laterality: N/A;   TRANSESOPHAGEAL ECHOCARDIOGRAM (CATH LAB) N/A 10/24/2023   Procedure: TRANSESOPHAGEAL ECHOCARDIOGRAM;  Surgeon: Mona Vinie BROCKS, MD;  Location: [facility];  Service: Cardiovascular;  Laterality: N/A;   TRANSESOPHAGEAL ECHOCARDIOGRAM (CATH LAB) N/A 11/28/2023   Procedure: TRANSESOPHAGEAL ECHOCARDIOGRAM;  Surgeon: Wendel Lurena POUR, MD;  Location: [facility];  Service: Cardiovascular;  Laterality: N/A;    SOCIAL HISTORY: Social History   Socioeconomic History   Marital status: Married    Spouse name: Not on file   Number of children: 0   Years of education: Not on file   Highest education level: Not on file  Occupational History   Not on file  Tobacco Use   Smoking status: Former    Current packs/day: 0.00    Types: Cigarettes    Quit date: 2002    Years since quitting: 23.7   Smokeless tobacco: Never  Tobacco comments:    Started smoking in her early 20's.    Smoked 0.5PP at her heaviest.   Vaping Use   Vaping status: Never Used  Substance and Sexual Activity   Alcohol use: Not Currently   Drug use: Never   Sexual activity: Yes    Partners: Male  Other Topics Concern   Not on file  Social History Narrative   Married   Social Drivers of Health   Financial Resource Strain: Low Risk  (06/03/2024)   Overall Financial Resource Strain (CARDIA)    Difficulty of Paying Living Expenses: Not hard at all  Food Insecurity: No Food Insecurity (06/03/2024)   Hunger Vital Sign    Worried About  Running Out of Food in the Last Year: Never true    Ran Out of Food in the Last Year: Never true  Transportation Needs: No Transportation Needs (06/03/2024)   PRAPARE - Administrator, Civil Service (Medical): No    Lack of Transportation (Non-Medical): No  Physical Activity: Sufficiently Active (06/03/2024)   Exercise Vital Sign    Days of Exercise per Week: 7 days    Minutes of Exercise per Session: 60 min  Stress: No Stress Concern Present (06/03/2024)   [employer] of Occupational Health - Occupational Stress Questionnaire    Feeling of Stress: Not at all  Social Connections: Socially Integrated (06/03/2024)   Social Connection and Isolation Panel    Frequency of Communication with Friends and Family: More than three times a week    Frequency of Social Gatherings with Friends and Family: More than three times a week    Attends Religious Services: More than 4 times per year    Active Member of [employer] or Organizations: Yes    Attends Engineer, structural: More than 4 times per year    Marital Status: Married  Catering manager Violence: Not At Risk (06/03/2024)   Humiliation, Afraid, Rape, and Kick questionnaire    Fear of Current or Ex-Partner: No    Emotionally Abused: No    Physically Abused: No    Sexually Abused: No    FAMILY HISTORY: Family History  Problem Relation Age of Onset   Heart disease Mother    Valvular heart disease Mother    Healthy Sister    Alcoholism Half-Sister     ALLERGIES:  is allergic to feraheme [ferumoxytol ], aspirin, and sulfa antibiotics.  MEDICATIONS:  Current Outpatient Medications  Medication Sig Dispense Refill   acetaminophen  (TYLENOL ) 500 MG tablet Take 1,000 mg by mouth every 6 (six) hours as needed for headache (pain.).     amoxicillin  (AMOXIL ) 500 MG tablet Take 4 tablets (2,000 mg total) by mouth as directed. 1 hour prior to dental work including cleanings (Patient not taking: Reported on 06/03/2024) 12 tablet 12    famotidine  (PEPCID ) 40 MG tablet TAKE 1 TABLET(40 MG) BY MOUTH DAILY 90 tablet 0   folic acid  (FOLVITE ) 1 MG tablet TAKE 1 TABLET(1 MG) BY MOUTH DAILY 90 tablet 1   furosemide  (LASIX ) 40 MG tablet Take 1 tablet (40 mg total) by mouth as needed for fluid or edema. (Patient not taking: Reported on 06/03/2024) 90 tablet 3   Glucosamine-Chondroitin (COSAMIN DS PO) Take 1 tablet by mouth daily. (Patient not taking: Reported on 06/03/2024)     hydroxyurea  (HYDREA ) 500 MG capsule Take 2 capsules (1,000 mg total) by mouth 2 (two) times daily. May take with food to minimize GI side effects. 120 capsule 1  Lycopene 10 MG CAPS Take 10 mg by mouth daily. (Patient not taking: Reported on 06/03/2024)     methocarbamol  (ROBAXIN ) 500 MG tablet Take 1 tablet (500 mg total) by mouth every 6 (six) hours as needed for muscle spasms. 60 tablet 1   sucralfate  (CARAFATE ) 1 g tablet TAKE 1 TABLET(1 GRAM) BY MOUTH FOUR TIMES DAILY AT BEDTIME WITH MEALS 360 tablet 1   tretinoin  (RETIN-A ) 0.05 % cream Apply topically at bedtime. 45 g 4   No current facility-administered medications for this visit.    REVIEW OF SYSTEMS:   Constitutional: ( - ) fevers, ( - )  chills , ( - ) night sweats Eyes: ( - ) blurriness of vision, ( - ) double vision, ( - ) watery eyes Ears, nose, mouth, throat, and face: ( - ) mucositis, ( - ) sore throat Respiratory: ( - ) cough, ( - ) dyspnea, ( - ) wheezes Cardiovascular: ( - ) palpitation, ( - ) chest discomfort, ( - ) lower extremity swelling Gastrointestinal:  ( - ) nausea, ( - ) heartburn, ( - ) change in bowel habits Skin: ( - ) abnormal skin rashes Lymphatics: ( - ) new lymphadenopathy, ( - ) easy bruising Neurological: ( - ) numbness, ( - ) tingling, ( - ) new weaknesses Behavioral/Psych: ( - ) mood change, ( - ) new changes  All other systems were reviewed with the patient and are negative.  PHYSICAL EXAMINATION:  There were no vitals filed for this visit.       GENERAL:  well appearing elderly Caucasian female, in NAD  SKIN: skin color, texture, turgor are normal, no rashes or significant lesions EYES: conjunctiva are pink and non-injected, sclera clear LUNGS: clear to auscultation and percussion with normal breathing effort HEART: regular rate & rhythm and no murmurs and no lower extremity edema Musculoskeletal: no cyanosis of digits and no clubbing  PSYCH: alert & oriented x 3, fluent speech NEURO: no focal motor/sensory deficits   LABORATORY DATA:  I have reviewed the data as listed    Latest Ref Rng & Units 05/27/2024    2:23 PM 05/20/2024   11:59 AM 05/20/2024   11:15 AM  CBC  WBC 4.0 - 10.5 K/uL 5.7  4.6  4.0   Hemoglobin 12.0 - 15.0 g/dL 87.9  87.8  88.2   Hematocrit 36.0 - 46.0 % 36.5  36.5  35.5   Platelets 150 - 400 K/uL 439  1,085  1,017        Latest Ref Rng & Units 05/27/2024    2:23 PM 05/20/2024   11:15 AM 05/17/2024    1:47 AM  CMP  Glucose 70 - 99 mg/dL 899  874  866   BUN 8 - 23 mg/dL 13  12  16    Creatinine 0.44 - 1.00 mg/dL 9.23  9.19  5.89   Sodium 135 - 145 mmol/L 135  140  136   Potassium 3.5 - 5.1 mmol/L 4.5  4.0  4.8   Chloride 98 - 111 mmol/L 103  107  108   CO2 22 - 32 mmol/L 29  28    Calcium  8.9 - 10.3 mg/dL 9.9  9.7    Total Protein 6.5 - 8.1 g/dL 6.7  6.7    Total Bilirubin 0.0 - 1.2 mg/dL 0.5  0.4    Alkaline Phos 38 - 126 U/L 60  55    AST 15 - 41 U/L 24  22    ALT  0 - 44 U/L 15  12     RADIOGRAPHIC STUDIES: MR BRAIN WO CONTRAST Result Date: 05/17/2024 CLINICAL DATA:  73 year old female with neurologic deficit. TIA. Right facial droop, and onset of headache. Recently stopped Plavix . EXAM: MRI HEAD WITHOUT CONTRAST TECHNIQUE: Multiplanar, multiecho pulse sequences of the brain and surrounding structures were obtained without intravenous contrast. COMPARISON:  Head CT 0123 hours today and earlier. FINDINGS: Brain: Normal cerebral volume for age. No restricted diffusion to suggest acute infarction. No midline  shift, mass effect, evidence of mass lesion, ventriculomegaly, extra-axial collection or acute intracranial hemorrhage. Cervicomedullary junction and pituitary are within normal limits. Largely normal for age gray and white matter signal throughout the brain. Mild for age scattered nonspecific cerebral white matter T2 and FLAIR hyperintensity. No cortical encephalomalacia. No chronic cerebral blood products (SWI). Deep gray nuclei, brainstem, and cerebellum appear normal. Vascular: Major intracranial vascular flow voids are preserved. Skull and upper cervical spine: Visualized bone marrow signal is within normal limits. Negative for age visible cervical spine. Sinuses/Orbits: Negative; minor paranasal sinus mucosal thickening is chronic. Other: Visible internal auditory structures appear normal. Mastoids are clear. Normal stylomastoid foramina, visible scalp and face. IMPRESSION: No acute intracranial abnormality. Essentially normal for age noncontrast MRI appearance of the brain. Electronically Signed   By: VEAR Hurst M.D.   On: 05/17/2024 06:37   CT HEAD WO CONTRAST Result Date: 05/17/2024 EXAM: CT HEAD WITHOUT CONTRAST 05/17/2024 01:28:34 AM TECHNIQUE: CT of the head was performed without the administration of intravenous contrast. Automated exposure control, iterative reconstruction, and/or weight based adjustment of the mA/kV was utilized to reduce the radiation dose to as low as reasonably achievable. COMPARISON: 08/31/2023 CLINICAL HISTORY: Neuro deficit, acute, stroke suspected. FINDINGS: BRAIN AND VENTRICLES: No acute hemorrhage. No evidence of acute infarct. No hydrocephalus. No extra-axial collection. No mass effect or midline shift. ORBITS: No acute abnormality. SINUSES: No acute abnormality. SOFT TISSUES AND SKULL: No acute soft tissue abnormality. No skull fracture. IMPRESSION: 1. No acute intracranial abnormality. Electronically signed by: Pinkie Pebbles MD 05/17/2024 01:30 AM EDT RP Workstation:  [id number]    ASSESSMENT & PLAN Kristina Osborne 73 y.o. female with medical history significant for essential thrombocytosis who presents for a follow up visit.   After review of the labs, review of the records, and discussion with the patient the patients findings are most consistent with thrombocytosis, reported due to essential thrombocythemia. We have received the records showing that she has a JAK2 mutation.  Additionally she will require a bone marrow biopsy in order to assure that essential thrombocythemia is the diagnosis.  In the interim we will have the patient continue her hydroxyurea  as previously prescribed by her provider in [place] .  # Essential thrombocythemia, JAK2 Positive  -- At this time we will need to confirm that the patient has essential thrombocythemia.  She will require a bone marrow biopsy to confirm this diagnosis --JAK2 testing received from patient's prior hematologist. Confirmed JAK2 mutation. Plan:   -- If and when the patient is agreeable we will schedule her for a bone marrow biopsy. --Hydroxyurea  was previously held due to GI bleed and anemia.  Anemia is improving with IV iron  therapy and therefore we can cotinue hydroxyurea  at this time. --previously on 1500 mg hydroxyurea  daily (1000 in PM and 500 in AM) .  Will increase to 1000 mg twice daily as her platelets are currently above target. --I would be very hesitant to transition her to anagrelide in the future given her proclivity  for bleeding. -- Labs today show WBC 4.5, hemoglobin 11.7, MCV 113.7, platelets 279 --continue with labs q 2 weeks followed by a clinic visit in 8 weeks.  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  More recent doses were performed on 11/07/2022, 01/18/2023, and most recently 06/26/2023. Feraheme administered on 2/7-2/14/2025.  --Patient has  history of GI bleeding. Patient notes no active maroon/red/black stools.  Appreciate assistance of GI in the management of this case. --Labs today as noted above.  -- If the patient were to have recurrent anemia or persistent iron  deficiency will order another dose of IV iron  --continue to follow with above labs.   No orders of the defined types were placed in this encounter.   All questions were answered. The patient knows to call the clinic with any problems, questions or concerns.  A total of more than 30 minutes were spent on this encounter with face-to-face time and non-face-to-face time, including preparing to see the patient, ordering tests and/or medications, counseling the patient and coordination of care as outlined above.   Norleen IVAR Kidney, MD Department of Hematology/Oncology [facility] Phone: [phone] Pager: [phone] Email: [email or web address]  06/08/2024 9:10 PM

## 2024-06-09 ENCOUNTER — Inpatient Hospital Stay (HOSPITAL_BASED_OUTPATIENT_CLINIC_OR_DEPARTMENT_OTHER): Admitting: Hematology and Oncology

## 2024-06-09 ENCOUNTER — Inpatient Hospital Stay: Attending: Hematology and Oncology

## 2024-06-09 VITALS — BP 108/58 | HR 70 | Temp 97.4°F | Resp 14 | Ht 75.0 in | Wt 143.0 lb

## 2024-06-09 DIAGNOSIS — D500 Iron deficiency anemia secondary to blood loss (chronic): Secondary | ICD-10-CM | POA: Diagnosis present

## 2024-06-09 DIAGNOSIS — Z7964 Long term (current) use of myelosuppressive agent: Secondary | ICD-10-CM | POA: Diagnosis not present

## 2024-06-09 DIAGNOSIS — K5520 Angiodysplasia of colon without hemorrhage: Secondary | ICD-10-CM | POA: Diagnosis present

## 2024-06-09 DIAGNOSIS — D473 Essential (hemorrhagic) thrombocythemia: Secondary | ICD-10-CM | POA: Diagnosis present

## 2024-06-09 DIAGNOSIS — Z79899 Other long term (current) drug therapy: Secondary | ICD-10-CM | POA: Diagnosis not present

## 2024-06-09 LAB — CBC WITH DIFFERENTIAL (CANCER CENTER ONLY)
Abs Immature Granulocytes: 0.01 K/uL (ref 0.00–0.07)
Basophils Absolute: 0 K/uL (ref 0.0–0.1)
Basophils Relative: 0 %
Eosinophils Absolute: 0 K/uL (ref 0.0–0.5)
Eosinophils Relative: 0 %
HCT: 34 % — ABNORMAL LOW (ref 36.0–46.0)
Hemoglobin: 11.7 g/dL — ABNORMAL LOW (ref 12.0–15.0)
Immature Granulocytes: 0 %
Lymphocytes Relative: 11 %
Lymphs Abs: 0.5 K/uL — ABNORMAL LOW (ref 0.7–4.0)
MCH: 39.1 pg — ABNORMAL HIGH (ref 26.0–34.0)
MCHC: 34.4 g/dL (ref 30.0–36.0)
MCV: 113.7 fL — ABNORMAL HIGH (ref 80.0–100.0)
Monocytes Absolute: 0.4 K/uL (ref 0.1–1.0)
Monocytes Relative: 8 %
Neutro Abs: 3.6 K/uL (ref 1.7–7.7)
Neutrophils Relative %: 81 %
Platelet Count: 279 K/uL (ref 150–400)
RBC: 2.99 MIL/uL — ABNORMAL LOW (ref 3.87–5.11)
RDW: 19.9 % — ABNORMAL HIGH (ref 11.5–15.5)
WBC Count: 4.5 K/uL (ref 4.0–10.5)
nRBC: 0 % (ref 0.0–0.2)

## 2024-06-09 LAB — CMP (CANCER CENTER ONLY)
ALT: 16 U/L (ref 0–44)
AST: 23 U/L (ref 15–41)
Albumin: 4.3 g/dL (ref 3.5–5.0)
Alkaline Phosphatase: 49 U/L (ref 38–126)
Anion gap: 4 — ABNORMAL LOW (ref 5–15)
BUN: 12 mg/dL (ref 8–23)
CO2: 28 mmol/L (ref 22–32)
Calcium: 10.2 mg/dL (ref 8.9–10.3)
Chloride: 105 mmol/L (ref 98–111)
Creatinine: 0.71 mg/dL (ref 0.44–1.00)
GFR, Estimated: >60 mL/min (ref >60)
Glucose, Bld: 119 mg/dL — ABNORMAL HIGH (ref 70–99)
Potassium: 4 mmol/L (ref 3.5–5.1)
Sodium: 137 mmol/L (ref 135–145)
Total Bilirubin: 0.5 mg/dL (ref 0.0–1.2)
Total Protein: 6.5 g/dL (ref 6.5–8.1)

## 2024-06-09 LAB — FERRITIN: Ferritin: 99 ng/mL (ref 11–307)

## 2024-06-12 ENCOUNTER — Other Ambulatory Visit: Payer: Self-pay | Admitting: Hematology and Oncology

## 2024-06-13 ENCOUNTER — Other Ambulatory Visit: Payer: Self-pay | Admitting: Hematology and Oncology

## 2024-06-23 ENCOUNTER — Inpatient Hospital Stay

## 2024-06-23 ENCOUNTER — Telehealth: Payer: Self-pay

## 2024-06-23 DIAGNOSIS — D473 Essential (hemorrhagic) thrombocythemia: Secondary | ICD-10-CM

## 2024-06-23 DIAGNOSIS — K635 Polyp of colon: Secondary | ICD-10-CM

## 2024-06-23 DIAGNOSIS — D126 Benign neoplasm of colon, unspecified: Secondary | ICD-10-CM

## 2024-06-23 LAB — CBC WITH DIFFERENTIAL (CANCER CENTER ONLY)
Abs Immature Granulocytes: 0.01 K/uL (ref 0.00–0.07)
Basophils Absolute: 0 K/uL (ref 0.0–0.1)
Basophils Relative: 0 %
Eosinophils Absolute: 0 K/uL (ref 0.0–0.5)
Eosinophils Relative: 0 %
HCT: 36.7 % (ref 36.0–46.0)
Hemoglobin: 12.2 g/dL (ref 12.0–15.0)
Immature Granulocytes: 0 %
Lymphocytes Relative: 16 %
Lymphs Abs: 0.5 K/uL — ABNORMAL LOW (ref 0.7–4.0)
MCH: 38.4 pg — ABNORMAL HIGH (ref 26.0–34.0)
MCHC: 33.2 g/dL (ref 30.0–36.0)
MCV: 115.4 fL — ABNORMAL HIGH (ref 80.0–100.0)
Monocytes Absolute: 0.2 K/uL (ref 0.1–1.0)
Monocytes Relative: 7 %
Neutro Abs: 2.2 K/uL (ref 1.7–7.7)
Neutrophils Relative %: 77 %
Platelet Count: 430 K/uL — ABNORMAL HIGH (ref 150–400)
RBC: 3.18 MIL/uL — ABNORMAL LOW (ref 3.87–5.11)
RDW: 19.9 % — ABNORMAL HIGH (ref 11.5–15.5)
WBC Count: 2.9 K/uL — ABNORMAL LOW (ref 4.0–10.5)
nRBC: 0 % (ref 0.0–0.2)

## 2024-06-23 LAB — CMP (CANCER CENTER ONLY)
ALT: 14 U/L (ref 0–44)
AST: 23 U/L (ref 15–41)
Albumin: 4.6 g/dL (ref 3.5–5.0)
Alkaline Phosphatase: 60 U/L (ref 38–126)
Anion gap: 4 — ABNORMAL LOW (ref 5–15)
BUN: 14 mg/dL (ref 8–23)
CO2: 31 mmol/L (ref 22–32)
Calcium: 10.2 mg/dL (ref 8.9–10.3)
Chloride: 103 mmol/L (ref 98–111)
Creatinine: 0.8 mg/dL (ref 0.44–1.00)
GFR, Estimated: >60 mL/min (ref >60)
Glucose, Bld: 96 mg/dL (ref 70–99)
Potassium: 4.3 mmol/L (ref 3.5–5.1)
Sodium: 138 mmol/L (ref 135–145)
Total Bilirubin: 0.4 mg/dL (ref 0.0–1.2)
Total Protein: 7.2 g/dL (ref 6.5–8.1)

## 2024-06-23 LAB — FERRITIN: Ferritin: 104 ng/mL (ref 11–307)

## 2024-06-23 NOTE — Telephone Encounter (Signed)
 Copied from CRM #[id number]. Topic: Referral - Question >> Jun 23, 2024 12:11 PM Frederich PARAS wrote: Reason for CRM: pt would like to know if she needs a referral for a colonoscopy. Pt callback  # is [phone]

## 2024-06-24 NOTE — Telephone Encounter (Signed)
 Called and left voicemail letting Pt know she just had a colonoscopy done in August & she will not be due until 2035.

## 2024-06-25 NOTE — Telephone Encounter (Unsigned)
 Copied from CRM #[id number]. Topic: Clinical - Request for Lab/Test Order >> Jun 24, 2024  3:52 PM Kristina Osborne wrote: Reason for CRM: Pt states she had Osborne colonoscopy in aug-had Osborne bleed on blood thinner-polyp in there, but cannot do it now since she was on blood thinner-needed transfusion-pt wants this out.  She states she got Osborne call that she just had one done, but wanted them to understand what is going on with this and why she needs another one. She still has Osborne polyp and wants it removed. She is no longer on plavix  so no longer on blood thinner.  Patient callback is [phone] >> Jun 25, 2024  8:47 AM Kristina Osborne wrote: Routing to correct office.

## 2024-06-27 NOTE — Addendum Note (Signed)
 Addended by: NORLEEN LYNWOOD ORN on: 06/27/2024 09:34 AM   Modules accepted: Orders

## 2024-06-27 NOTE — Telephone Encounter (Signed)
 Ok to contact pt  I reviewed the chart information and confirmed your phone message information.   I will place referral to Dr Legrand for repeat colonoscopy   Hopefully you will hear soon.  Thanks

## 2024-06-28 NOTE — Addendum Note (Signed)
 Addended by: NORLEEN LYNWOOD ORN on: 06/28/2024 07:33 PM   Modules accepted: Orders

## 2024-06-28 NOTE — Telephone Encounter (Signed)
 Ok this was corrected, thanks

## 2024-06-30 ENCOUNTER — Telehealth: Payer: Self-pay | Admitting: *Deleted

## 2024-06-30 NOTE — Telephone Encounter (Signed)
 Received vm message from pt with medication questions related to her hydrea . TCT patient and spoke with her. She states she can't remember if she took her usdual dose of Hydrea  yesterday as she was distracted by something. She is asking if she should just resume her usual doses. Advised that she can do exactly that-resume her usual doses of 1 capsule BID. Pt voiced understanding.

## 2024-06-30 NOTE — Telephone Encounter (Signed)
This has already been corrected.

## 2024-06-30 NOTE — Telephone Encounter (Unsigned)
 Copied from CRM [id number]. Topic: Clinical - Medical Advice >> Jun 27, 2024  4:36 PM Rea ORN wrote: Reason for CRM: Pt received a call that a referral was placed for Dr. Legrand for repeat colonoscopy. Pt stated she see's Dr. Albertus. Pt would like to know why PCP sent the order to Dr. Albertus. After looking through the chart, I advised pt that CMA has already sent an message to PCP advising she see's Dr. Albertus.   Please call back [phone]

## 2024-07-07 ENCOUNTER — Inpatient Hospital Stay: Attending: Hematology and Oncology

## 2024-07-07 DIAGNOSIS — D473 Essential (hemorrhagic) thrombocythemia: Secondary | ICD-10-CM | POA: Diagnosis present

## 2024-07-07 DIAGNOSIS — K5520 Angiodysplasia of colon without hemorrhage: Secondary | ICD-10-CM | POA: Insufficient documentation

## 2024-07-07 DIAGNOSIS — D500 Iron deficiency anemia secondary to blood loss (chronic): Secondary | ICD-10-CM | POA: Diagnosis present

## 2024-07-07 LAB — CBC WITH DIFFERENTIAL (CANCER CENTER ONLY)
Abs Immature Granulocytes: 0.01 K/uL (ref 0.00–0.07)
Basophils Absolute: 0 K/uL (ref 0.0–0.1)
Basophils Relative: 1 %
Eosinophils Absolute: 0 K/uL (ref 0.0–0.5)
Eosinophils Relative: 0 %
HCT: 36.6 % (ref 36.0–46.0)
Hemoglobin: 12.5 g/dL (ref 12.0–15.0)
Immature Granulocytes: 0 %
Lymphocytes Relative: 15 %
Lymphs Abs: 0.5 K/uL — ABNORMAL LOW (ref 0.7–4.0)
MCH: 39.3 pg — ABNORMAL HIGH (ref 26.0–34.0)
MCHC: 34.2 g/dL (ref 30.0–36.0)
MCV: 115.1 fL — ABNORMAL HIGH (ref 80.0–100.0)
Monocytes Absolute: 0.4 K/uL (ref 0.1–1.0)
Monocytes Relative: 12 %
Neutro Abs: 2.3 K/uL (ref 1.7–7.7)
Neutrophils Relative %: 72 %
Platelet Count: 427 K/uL — ABNORMAL HIGH (ref 150–400)
RBC: 3.18 MIL/uL — ABNORMAL LOW (ref 3.87–5.11)
RDW: 21 % — ABNORMAL HIGH (ref 11.5–15.5)
WBC Count: 3.1 K/uL — ABNORMAL LOW (ref 4.0–10.5)
nRBC: 0 % (ref 0.0–0.2)

## 2024-07-07 LAB — CMP (CANCER CENTER ONLY)
ALT: 15 U/L (ref 0–44)
AST: 26 U/L (ref 15–41)
Albumin: 4.4 g/dL (ref 3.5–5.0)
Alkaline Phosphatase: 62 U/L (ref 38–126)
Anion gap: 6 (ref 5–15)
BUN: 13 mg/dL (ref 8–23)
CO2: 27 mmol/L (ref 22–32)
Calcium: 9.9 mg/dL (ref 8.9–10.3)
Chloride: 106 mmol/L (ref 98–111)
Creatinine: 0.76 mg/dL (ref 0.44–1.00)
GFR, Estimated: >60 mL/min (ref >60)
Glucose, Bld: 101 mg/dL — ABNORMAL HIGH (ref 70–99)
Potassium: 4.3 mmol/L (ref 3.5–5.1)
Sodium: 139 mmol/L (ref 135–145)
Total Bilirubin: 0.4 mg/dL (ref 0.0–1.2)
Total Protein: 7 g/dL (ref 6.5–8.1)

## 2024-07-07 LAB — FERRITIN: Ferritin: 72 ng/mL (ref 11–307)

## 2024-07-14 ENCOUNTER — Telehealth: Payer: Self-pay | Admitting: *Deleted

## 2024-07-14 NOTE — Telephone Encounter (Signed)
 Received vm message from pt inquiring about Ferritin results from 07/07/24. TCT patient and spoke with her. Advised that her Ferritin is good at 72 and HGB is stable @ 12.5 Pt voiced understanding. She states she was feeling light headed last week and just wanted to make sure her labs were ok. Advised to check her BP during those times to see if her BP is low during those episodes. Also advised to keep her cardiologist in the loop as well. She said she would.

## 2024-07-18 ENCOUNTER — Telehealth: Payer: Self-pay | Admitting: *Deleted

## 2024-07-18 ENCOUNTER — Other Ambulatory Visit: Payer: Self-pay | Admitting: *Deleted

## 2024-07-18 DIAGNOSIS — D473 Essential (hemorrhagic) thrombocythemia: Secondary | ICD-10-CM

## 2024-07-18 DIAGNOSIS — D500 Iron deficiency anemia secondary to blood loss (chronic): Secondary | ICD-10-CM

## 2024-07-18 NOTE — Telephone Encounter (Signed)
 Received call from pt. She states that she is feeling weak and fatigued and is asking for her labs-iron  , cbc to be checked . Advised that it has been only 2 weeks since her last labs were done. She states she just wants to make sure all is well. If her labs are ok , she states she will contact her cardiologist. Appt made for 07/21/24 and lab orders placed.   Dr. Federico is aware.

## 2024-07-21 ENCOUNTER — Inpatient Hospital Stay

## 2024-07-21 DIAGNOSIS — D500 Iron deficiency anemia secondary to blood loss (chronic): Secondary | ICD-10-CM

## 2024-07-21 DIAGNOSIS — D473 Essential (hemorrhagic) thrombocythemia: Secondary | ICD-10-CM | POA: Diagnosis not present

## 2024-07-21 LAB — CBC WITH DIFFERENTIAL (CANCER CENTER ONLY)
Abs Immature Granulocytes: 0.01 K/uL (ref 0.00–0.07)
Basophils Absolute: 0 K/uL (ref 0.0–0.1)
Basophils Relative: 1 %
Eosinophils Absolute: 0 K/uL (ref 0.0–0.5)
Eosinophils Relative: 0 %
HCT: 34.3 % — ABNORMAL LOW (ref 36.0–46.0)
Hemoglobin: 11.7 g/dL — ABNORMAL LOW (ref 12.0–15.0)
Immature Granulocytes: 0 %
Lymphocytes Relative: 14 %
Lymphs Abs: 0.5 K/uL — ABNORMAL LOW (ref 0.7–4.0)
MCH: 39.5 pg — ABNORMAL HIGH (ref 26.0–34.0)
MCHC: 34.1 g/dL (ref 30.0–36.0)
MCV: 115.9 fL — ABNORMAL HIGH (ref 80.0–100.0)
Monocytes Absolute: 0.3 K/uL (ref 0.1–1.0)
Monocytes Relative: 10 %
Neutro Abs: 2.5 K/uL (ref 1.7–7.7)
Neutrophils Relative %: 75 %
Platelet Count: 219 K/uL (ref 150–400)
RBC: 2.96 MIL/uL — ABNORMAL LOW (ref 3.87–5.11)
RDW: 21.8 % — ABNORMAL HIGH (ref 11.5–15.5)
WBC Count: 3.3 K/uL — ABNORMAL LOW (ref 4.0–10.5)
nRBC: 0 % (ref 0.0–0.2)

## 2024-07-21 LAB — RETIC PANEL
Immature Retic Fract: 16.2 % — ABNORMAL HIGH (ref 2.3–15.9)
RBC.: 2.95 MIL/uL — ABNORMAL LOW (ref 3.87–5.11)
Retic Count, Absolute: 33 K/uL (ref 19.0–186.0)
Retic Ct Pct: 1.1 % (ref 0.4–3.1)
Reticulocyte Hemoglobin: 41.6 pg (ref >27.9)

## 2024-07-21 LAB — CMP (CANCER CENTER ONLY)
ALT: 17 U/L (ref 0–44)
AST: 28 U/L (ref 15–41)
Albumin: 4.5 g/dL (ref 3.5–5.0)
Alkaline Phosphatase: 65 U/L (ref 38–126)
Anion gap: 8 (ref 5–15)
BUN: 11 mg/dL (ref 8–23)
CO2: 27 mmol/L (ref 22–32)
Calcium: 9.9 mg/dL (ref 8.9–10.3)
Chloride: 103 mmol/L (ref 98–111)
Creatinine: 0.72 mg/dL (ref 0.44–1.00)
GFR, Estimated: >60 mL/min (ref >60)
Glucose, Bld: 106 mg/dL — ABNORMAL HIGH (ref 70–99)
Potassium: 4.1 mmol/L (ref 3.5–5.1)
Sodium: 138 mmol/L (ref 135–145)
Total Bilirubin: 0.5 mg/dL (ref 0.0–1.2)
Total Protein: 7 g/dL (ref 6.5–8.1)

## 2024-07-21 LAB — IRON AND IRON BINDING CAPACITY (CC-WL,HP ONLY)
Iron: 102 ug/dL (ref 28–170)
Saturation Ratios: 32 % — ABNORMAL HIGH (ref 10.4–31.8)
TIBC: 319 ug/dL (ref 250–450)
UIBC: 217 ug/dL

## 2024-07-21 LAB — FERRITIN: Ferritin: 69 ng/mL (ref 11–307)

## 2024-07-22 ENCOUNTER — Encounter: Payer: Self-pay | Admitting: Family Medicine

## 2024-07-22 ENCOUNTER — Ambulatory Visit: Payer: Self-pay

## 2024-07-22 ENCOUNTER — Ambulatory Visit: Admitting: Family Medicine

## 2024-07-22 VITALS — BP 100/58 | HR 72 | Temp 98.6°F

## 2024-07-22 DIAGNOSIS — M25561 Pain in right knee: Secondary | ICD-10-CM

## 2024-07-22 DIAGNOSIS — M7051 Other bursitis of knee, right knee: Secondary | ICD-10-CM

## 2024-07-22 MED ORDER — METHYLPREDNISOLONE 4 MG PO TBPK: ORAL_TABLET | ORAL | 0 refills | Status: DC

## 2024-07-22 NOTE — Patient Instructions (Signed)
 Magnesium glycinate 200 mg.

## 2024-07-22 NOTE — Telephone Encounter (Signed)
 FYI Only or Action Required?: Action required by provider: referral request. To ortho  Patient was last seen in primary care on 10/18/2023 by Norleen Lynwood ORN, MD.  Called Nurse Triage reporting Knee Pain.  Symptoms began several days ago.  Interventions attempted:  Tylenol .  Symptoms are: gradually worsening.  Triage Disposition: See HCP Within 4 Hours (Or PCP Triage)  Patient/caregiver understands and will follow disposition?: Yes                     Copied from CRM #[id number]. Topic: Clinical - Red Word Triage >> Jul 22, 2024  9:12 AM [name] wrote: Red Word that prompted transfer to Nurse Triage:Patient having pain in right knee, requesting referral to Orthopedics Reason for Disposition  [1] SEVERE pain (e.g., excruciating, unable to walk) AND [2] not improved after 2 hours of pain medicine  Answer Assessment - Initial Assessment Questions 1. LOCATION and RADIATION: Where is the pain located?      Right knee inside 2. QUALITY: What does the pain feel like?  (e.g., sharp, dull, aching, burning) & burning 3. SEVERITY: How bad is the pain? What does it keep you from doing?   (Scale 1-10; or mild, moderate, severe)     8/10 4. ONSET: When did the pain start? Does it come and go, or is it there all the time?     3-4 days ago 5. RECURRENT: Have you had this pain before? If Yes, ask: When, and what happened then?     yes 6. SETTING: Has there been any recent work, exercise or other activity that involved that part of the body?      No - pt walks 02-7999 steps daily 7. AGGRAVATING FACTORS: What makes the knee pain worse? (e.g., walking, climbing stairs, running)     walking 8. ASSOCIATED SYMPTOMS: Is there any swelling or redness of the knee?     Pt thinks this is a torn meniscus  Protocols used: Knee Pain-A-AH

## 2024-07-22 NOTE — Progress Notes (Signed)
 Acute Office Visit  Subjective:     Patient ID: Kristina Osborne, female    DOB: [birth year], 73 y.o.   MRN: [id number]  Chief Complaint  Patient presents with   Knee Pain    Patient complains of right knee pain x4 days, no known injury, tried Tylenol  with no relief    HPI Discussed the use of AI scribe software for clinical note transcription with the patient, who gave verbal consent to proceed.  History of Present Illness   Kristina Osborne is a 73 year old female with thrombocytosis who presents with knee pain.  She has had severe burning pain in the medial posterior-distal knee for four days, starting while walking. Pain worsens after being still, especially at night. She denies recent twisting injury or high-impact activity.  She has tried ice, heat, a knee brace, and Tylenol  without relief. She is worried this will limit her walking, which she does regularly for thrombocytosis.  She takes hydroxyurea  for thrombocytosis, folic acid , famotidine , and Carafate . She is off cardiac medications after a mitral valve clip in April. She has intestinal AVMs and was taken off Plavix  due to bleeding. She has not used Lasix  recently. Recent labs showed platelets 219 and ferritin 69, both acceptable.       Review of Systems  All other systems reviewed and are negative.       Objective:    BP (!) 100/58   Pulse 72   Temp 98.6 F (37 C) (Oral)   LMP  (LMP Unknown)   SpO2 100%    Physical Exam Vitals reviewed.  Constitutional:      Appearance: Normal appearance. She is normal weight.  Cardiovascular:     Pulses: Normal pulses.  Pulmonary:     Effort: Pulmonary effort is normal.  Musculoskeletal:        General: Tenderness (right knee mediasl tenderness to palpation over the tibial plateau on the medial stide) present. No swelling or deformity. Normal range of motion.  Neurological:     Mental Status: She is alert and oriented to person, place, and time. Mental status is  at baseline.  Psychiatric:        Mood and Affect: Mood normal.        Behavior: Behavior normal.     No results found for any visits on 07/22/24.      Assessment & Plan:   Problem List Items Addressed This Visit   None Visit Diagnoses       Pes anserinus bursitis of right knee    -  Primary   Relevant Medications   methylPREDNISolone  (MEDROL  DOSEPAK) 4 MG TBPK tablet     Assessment and Plan    Right knee bursitis Acute right knee pain for four days, localized to the medial aspect, exacerbated by rest and alleviated by walking. Likely bursitis due to overuse from walking 6,000 to 7,000 steps daily. Differential includes arthritis or meniscal tear, but pain pattern suggests bursitis. No signs of deep vein thrombosis or joint effusion. Compression stockings and Tylenol  ineffective. NSAIDs contraindicated due to intestinal AVMs and bleeding risk. - Prescribed Medrol  Dosepak for inflammation. - Advised rest and reduction of walking to allow healing. - Continue Tylenol  for pain management. - If pain persists, will consider x-rays and referral to orthopedist.  Chronic thrombocytosis Managed with hydroxyurea . Recent labs show platelet count at 219,000, indicating effective control. No current cardiac symptoms or need for cardiac medications. - Continue hydroxyurea  for thrombocytosis management.  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 NSAIDs contraindicated due to bleeding risk. No current bleeding symptoms. - Avoid NSAIDs due to bleeding risk.  Insomnia Chronic insomnia with difficulty falling asleep, exacerbated by husband's early bedtime. Melatonin not recommended due to potential bleeding risk. - Recommended magnesium  glycinate 100-200 mg at night for sleep aid.        Meds ordered this encounter  Medications   methylPREDNISolone  (MEDROL  DOSEPAK) 4 MG TBPK tablet    Sig: Take as directed.     Dispense:  21 each    Refill:  0    No follow-ups on file.  Heron CHRISTELLA Sharper, MD

## 2024-07-23 NOTE — Addendum Note (Signed)
 Addended by: NORLEEN LYNWOOD ORN on: 07/23/2024 04:58 PM   Modules accepted: Orders

## 2024-07-23 NOTE — Telephone Encounter (Signed)
 Welcome I will place referral.

## 2024-08-04 ENCOUNTER — Ambulatory Visit: Admitting: Family Medicine

## 2024-08-04 ENCOUNTER — Ambulatory Visit

## 2024-08-04 ENCOUNTER — Encounter: Payer: Self-pay | Admitting: Family Medicine

## 2024-08-04 VITALS — BP 118/60 | HR 97 | Temp 98.8°F

## 2024-08-04 DIAGNOSIS — M25561 Pain in right knee: Secondary | ICD-10-CM | POA: Diagnosis not present

## 2024-08-04 DIAGNOSIS — G8929 Other chronic pain: Secondary | ICD-10-CM

## 2024-08-04 DIAGNOSIS — R202 Paresthesia of skin: Secondary | ICD-10-CM | POA: Diagnosis not present

## 2024-08-04 DIAGNOSIS — M7051 Other bursitis of knee, right knee: Secondary | ICD-10-CM

## 2024-08-04 MED ORDER — TRAMADOL HCL 50 MG PO TABS: 50.0000 mg | ORAL_TABLET | Freq: Three times a day (TID) | ORAL | 0 refills | Status: AC | PRN

## 2024-08-04 NOTE — Patient Instructions (Addendum)
 [email or web address]  [facility]

## 2024-08-04 NOTE — Progress Notes (Unsigned)
 Established Patient Office Visit  Subjective   Patient ID: Kristina Osborne, female    DOB: 09-Dec-1950  Age: 73 y.o. MRN: [id number]  Chief Complaint  Patient presents with   Establish Care    TOC from Dr Lynwood Rush    HPI Discussed the use of AI scribe software for clinical note transcription with the patient, who gave verbal consent to proceed.  History of Present Illness   Kristina Osborne is a 73 year old female who presents with knee pain.  She has nearly debilitating localized knee pain. It improved on higher dose oral steroids but worsened as the dose was reduced. She cannot take anti-inflammatories due to bleeding risk, and Tylenol  has not helped.  She also has bilateral numbness in the last two toes of her left foot and diffuse joint pain in her hips, arms, and shoulders. She notices clicking and pain in her thumb with certain hand movements.  She has PVCs and feels she has recurred, but she does not feel short of breath or otherwise limited by them.  She reports marked stress and fatigue from being the primary caregiver for her husband, who needs constant care. Her limited respite support feels inadequate.  She takes hydroxyurea , Carafate , Pepcid , and folic acid . Her B12 level was low-normal in August.       Current Outpatient Medications  Medication Instructions   acetaminophen  (TYLENOL ) 1,000 mg, Every 6 hours PRN   famotidine  (PEPCID ) 40 MG tablet TAKE 1 TABLET(40 MG) BY MOUTH DAILY   folic acid  (FOLVITE ) 1 MG tablet TAKE 1 TABLET(1 MG) BY MOUTH DAILY   hydroxyurea  (HYDREA ) 500 MG capsule TAKE 1 CAPSULE(500 MG) BY MOUTH TWICE DAILY   OVER THE COUNTER MEDICATION OTC medication for macular degeneration-cannot recall name   sucralfate  (CARAFATE ) 1 g tablet TAKE 1 TABLET(1 GRAM) BY MOUTH FOUR TIMES DAILY AT BEDTIME WITH MEALS   tretinoin  (RETIN-A ) 0.05 % cream Topical, Daily at bedtime    Patient Active Problem List   Diagnosis Date Noted   AVM (arteriovenous  malformation) of small bowel, acquired with hemorrhage 04/25/2024   Melena 04/23/2024   Lower GI bleed 04/22/2024   S/P mitral valve clip implantation 11/28/2023   Pulmonary hypertension, unspecified (HCC) 11/02/2023   (HFpEF) heart failure with preserved ejection fraction (HCC) 11/02/2023   Hypokalemia 10/18/2023   Severe mitral regurgitation 10/18/2023   Posterior neck pain 09/08/2023   LLQ pain 09/08/2023   Acute non-recurrent maxillary sinusitis 08/09/2023   Sinus congestion 08/09/2023   Sinus headache 08/09/2023   Macrocytic anemia 06/25/2022   GI bleed 06/24/2022   Iron  deficiency anemia    Heme positive stool    Jejunal polyp    Benign neoplasm of colon    Acute blood loss anemia 04/16/2022   Symptomatic anemia 04/16/2022   GI bleeding 04/15/2022   Right elbow pain 11/06/2021   Palpitations 11/06/2021   Cervical radiculitis 11/02/2021   AVM (arteriovenous malformation) of colon 09/10/2021   MVP (mitral valve prolapse) 09/10/2021   Gastroesophageal reflux disease without esophagitis 09/10/2021   Thrombocytosis 11/12/2019     Review of Systems  All other systems reviewed and are negative.     Objective:     BP 118/60   Pulse 97   Temp 98.8 F (37.1 C) (Oral)   LMP  (LMP Unknown)   SpO2 98%    Physical Exam Vitals reviewed.  Constitutional:      Appearance: Normal appearance. She is normal weight.  Cardiovascular:  Pulses: Normal pulses.  Pulmonary:     Effort: Pulmonary effort is normal.  Musculoskeletal:        General: No swelling or deformity. Tenderness: right knee mediasl tenderness to palpation over the tibial plateau on the medial stide.Normal range of motion.  Neurological:     Mental Status: She is alert and oriented to person, place, and time. Mental status is at baseline.  Psychiatric:        Mood and Affect: Mood normal.        Behavior: Behavior normal.      Results for orders placed or performed in visit on 08/04/24  Vitamin B12   Result Value Ref Range   Vitamin B-12 158 (L) 211 - 911 pg/mL      The 10-year ASCVD risk score (Arnett DK, et al., 2019) is: 6.4%    Assessment & Plan:  Pes anserinus bursitis of right knee -     Ambulatory referral to Sports Medicine -     traMADol  HCl; Take 1 tablet (50 mg total) by mouth every 8 (eight) hours as needed for up to 5 days.  Dispense: 15 tablet; Refill: 0  Chronic pain of right knee -     DG Knee 1-2 Views Right; Future  Paresthesia of both feet -     Vitamin B12; Future  Caregiver with fatigue   Assessment and Plan    Pes anserinus bursitis of right knee Initial improvement with high-dose steroids suggests a steroid-responsive inflammatory condition, likely bursitis. Pain returned after tapering steroids, indicating need for localized treatment. - Ordered x-ray of right knee - Referred to sports medicine for bursal steroid injection - Prescribed tramadol  for severe pain management  Paresthesia of both feet Bilateral numbness in the last two toes, more pronounced on the left side. Possible causes include B12 deficiency, nerve impingement, or postural changes. Previous labs showed low-normal B12 levels. - Ordered B12 level - Recommended starting B12 supplement  Caregiver stress and fatigue Significant caregiver stress and fatigue due to constant care responsibilities for husband. Symptoms include feeling overwhelmed and needing frequent naps. Limited respite care currently available. - Recommended expanding respite care to three times a week - Suggested contacting [facility] for additional resources - Advised using [email or web address] for local resources - Encouraged contacting insurance for potential additional benefits  General Health Maintenance No recent mammogram in 30 years. Declined bone density test despite history of ballet and regular exercise. - Discussed mammogram screening - Discussed bone density test     I personally spent  a total of 30 minutes in the care of the patient today including counseling and educating, documenting clinical information in the EHR, independently interpreting results, and communicating results.    Return for please change her AWV to me in office in person.    Heron CHRISTELLA Sharper, MD

## 2024-08-05 ENCOUNTER — Telehealth: Payer: Self-pay

## 2024-08-05 DIAGNOSIS — K635 Polyp of colon: Secondary | ICD-10-CM

## 2024-08-05 DIAGNOSIS — F419 Anxiety disorder, unspecified: Secondary | ICD-10-CM

## 2024-08-05 LAB — VITAMIN B12: Vitamin B-12: 158 pg/mL — ABNORMAL LOW (ref 211–911)

## 2024-08-05 NOTE — Telephone Encounter (Signed)
 Ok this referral is done - Reason for CRM: patient calling to request a referral to [employer]. [phone]. She is asking for a call back to let her know what she needs to do.   Also please let pt know that I took the liberty of cancelling the recent orthopedic referral, as we see that she was seen for the knee problem yesterday and referred to sports medicine instead.     Thanks

## 2024-08-05 NOTE — Addendum Note (Signed)
 Addended by: NORLEEN LYNWOOD ORN on: 08/05/2024 12:08 PM   Modules accepted: Orders

## 2024-08-05 NOTE — Telephone Encounter (Signed)
 Copied from CRM #[id number]. Topic: Referral - Question >> Aug 05, 2024  9:36 AM Alfonso HERO wrote: Reason for CRM: patient calling to request a referral to [facility] health. [phone]. She is asking for a call back to let her know what she needs to do.

## 2024-08-05 NOTE — Progress Notes (Unsigned)
 Kristina Console, PA-C [address] [place], [place]  [address] Phone: [phone]   Primary Care Physician: Norleen Lynwood ORN, MD  Primary Gastroenterologist:  Kristina Console, PA-C / ***  Chief Complaint: Discussed earlier colonoscopy       HPI:   Discussed the use of AI scribe software for clinical note transcription with the patient, who gave verbal consent to proceed.  Medical history significant for iron  deficiency anemia due to GI bleeding from AVMs, treated with right hemicolectomy decades ago and subsequent segmental jejunal resection.  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 Federico (last F/U 05/2024) treated with hydroxyurea .  Hydroxyurea  was previously held due to GI bleed and anemia.  Anemia improved with IV iron  therapy, therefore hydroxyurea  was restarted.  Has history of mitral valve prolapse s/p MitraClip.  Patient was on Plavix  which was held during hospitalization for GI bleed.  Not currently on Plavix .  Was hospitalized 04/22/2024 until 04/26/2024 for GI bleeding due to multiple intestinal AVMs and symptomatic anemia.  Hemoglobin dropped to 8.  Transfused 1 unit PRBC.  Hgb improved to 9.5.  Received 1 unit IV iron .  04/24/2024 last colonoscopy by Dr. 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  Clips were placed.  15 mm polyp at the anastomosis which was neither biopsied nor removed since last Plavix  dose was 2 days prior.  No specimens or biopsies were done.  04/23/2024 small bowel endoscopy:  Normal stomach and duodenum.  Tattoo in the jejunum.  5 mm benign-appearing polyp found in the jejunum not biopsied or removed due to being on Plavix .  No evidence of AVM or bleeding.  08/04/2024 lab: Low vitamin B12 158. 07/21/2024 lab: Stable Hgb 11.7, HCT 34, MCV 115, platelet 219, WBC 3.3.  Normal ferritin 69.  Normal iron  102, iron  saturation 32%.  History of Present  Illness      Current Outpatient Medications  Medication Sig Dispense Refill   acetaminophen  (TYLENOL ) 500 MG tablet Take 1,000 mg by mouth every 6 (six) hours as needed for headache (pain.).     famotidine  (PEPCID ) 40 MG tablet TAKE 1 TABLET(40 MG) BY MOUTH DAILY 90 tablet 0   folic acid  (FOLVITE ) 1 MG tablet TAKE 1 TABLET(1 MG) BY MOUTH DAILY 90 tablet 1   hydroxyurea  (HYDREA ) 500 MG capsule TAKE 1 CAPSULE(500 MG) BY MOUTH TWICE DAILY 120 capsule 2   OVER THE COUNTER MEDICATION OTC medication for macular degeneration-cannot recall name     sucralfate  (CARAFATE ) 1 g tablet TAKE 1 TABLET(1 GRAM) BY MOUTH FOUR TIMES DAILY AT BEDTIME WITH MEALS 360 tablet 1   traMADol  (ULTRAM ) 50 MG tablet Take 1 tablet (50 mg total) by mouth every 8 (eight) hours as needed for up to 5 days. 15 tablet 0   tretinoin  (RETIN-A ) 0.05 % cream Apply topically at bedtime. 45 g 4   No current facility-administered medications for this visit.    Allergies as of 08/06/2024 - Review Complete 08/04/2024  Allergen Reaction Noted   Feraheme [ferumoxytol ] Nausea And Vomiting 05/23/2024   Aspirin Other (See Comments) 09/01/2021   Sulfa antibiotics Other (See Comments) 04/08/2021    Past Medical History:  Diagnosis Date   ABLA (acute blood loss anemia) 04/16/2022   Congenital malformation of intestinal fixation (HCC)    Gastritis    Gastroesophageal reflux disease without esophagitis 09/10/2021   IDA (iron  deficiency anemia)    Mitral valve prolapse  Persistent hyperplasia of thymus    S/P Thymectomy   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 Wendel   Thrombocytosis     Past Surgical History:  Procedure Laterality Date   BIOPSY  06/26/2022   Procedure: BIOPSY;  Surgeon: Stacia Glendia BRAVO, MD;  Location: [facility];  Service: Gastroenterology;;   COLONOSCOPY N/A 04/17/2022   Procedure: COLONOSCOPY;  Surgeon: Albertus Gordy HERO, MD;  Location: [facility];  Service: Gastroenterology;  Laterality: N/A;   COLONOSCOPY N/A 04/24/2024   Procedure: COLONOSCOPY;  Surgeon: Legrand Victory LITTIE DOUGLAS, MD;  Location: [facility];  Service: Gastroenterology;  Laterality: N/A;   COLONOSCOPY WITH PROPOFOL  N/A 06/26/2022   Procedure: COLONOSCOPY WITH PROPOFOL ;  Surgeon: Stacia Glendia BRAVO, MD;  Location: [facility];  Service: Gastroenterology;  Laterality: N/A;   ENTEROSCOPY N/A 04/17/2022   Procedure: ENTEROSCOPY;  Surgeon: Albertus Gordy HERO, MD;  Location: [facility];  Service: Gastroenterology;  Laterality: N/A;   ENTEROSCOPY N/A 06/26/2022   Procedure: ENTEROSCOPY;  Surgeon: Stacia Glendia BRAVO, MD;  Location: [facility];  Service: Gastroenterology;  Laterality: N/A;   ENTEROSCOPY N/A 04/23/2024   Procedure: ENTEROSCOPY;  Surgeon: Legrand Victory LITTIE DOUGLAS, MD;  Location: [facility];  Service: Gastroenterology;  Laterality: N/A;   GIVENS CAPSULE STUDY N/A 06/26/2022   Procedure: GIVENS CAPSULE STUDY;  Surgeon: Stacia Glendia BRAVO, MD;  Location: [facility];  Service: Gastroenterology;  Laterality: N/A;   GIVENS CAPSULE STUDY N/A 04/24/2024   Procedure: IMAGING PROCEDURE, GI TRACT, INTRALUMINAL, VIA CAPSULE;  Surgeon: Legrand Victory LITTIE DOUGLAS, MD;  Location: [facility];  Service: Gastroenterology;  Laterality: N/A;   HEMOSTASIS CLIP PLACEMENT  04/17/2022   Procedure: HEMOSTASIS CLIP PLACEMENT;  Surgeon: Albertus Gordy HERO, MD;  Location: [facility];  Service: Gastroenterology;;   HEMOSTASIS CLIP PLACEMENT  06/26/2022   Procedure: HEMOSTASIS CLIP PLACEMENT;  Surgeon: Stacia Glendia BRAVO, MD;  Location: [facility];  Service: Gastroenterology;;   HOT HEMOSTASIS N/A 04/17/2022   Procedure: HOT HEMOSTASIS (ARGON PLASMA COAGULATION/BICAP);  Surgeon: Albertus Gordy HERO, MD;  Location: [facility];  Service: Gastroenterology;  Laterality: N/A;   HOT HEMOSTASIS N/A 06/26/2022   Procedure: HOT HEMOSTASIS (ARGON PLASMA COAGULATION/BICAP);  Surgeon:  Stacia Glendia BRAVO, MD;  Location: [facility];  Service: Gastroenterology;  Laterality: N/A;   POLYPECTOMY  04/17/2022   Procedure: POLYPECTOMY;  Surgeon: Albertus Gordy HERO, MD;  Location: [facility];  Service: Gastroenterology;;   POLYPECTOMY  06/26/2022   Procedure: POLYPECTOMY;  Surgeon: Stacia Glendia BRAVO, MD;  Location: [facility];  Service: Gastroenterology;;   RIGHT/LEFT HEART CATH AND CORONARY ANGIOGRAPHY N/A 11/01/2023   Procedure: RIGHT/LEFT HEART CATH AND CORONARY ANGIOGRAPHY;  Surgeon: Darron Deatrice LABOR, MD;  Location: [facility];  Service: Cardiovascular;  Laterality: N/A;   SMALL INTESTINE SURGERY  1994   SUBMUCOSAL TATTOO INJECTION  06/26/2022   Procedure: SUBMUCOSAL TATTOO INJECTION;  Surgeon: Stacia Glendia BRAVO, MD;  Location: [facility];  Service: Gastroenterology;;   THYMECTOMY N/A    TRANSCATHETER MITRAL EDGE TO EDGE REPAIR N/A 11/28/2023   Procedure: TRANSCATHETER MITRAL EDGE TO EDGE REPAIR;  Surgeon: Wendel Lurena POUR, MD;  Location: [facility];  Service: Cardiovascular;  Laterality: N/A;   TRANSESOPHAGEAL ECHOCARDIOGRAM (CATH LAB) N/A 10/24/2023   Procedure: TRANSESOPHAGEAL ECHOCARDIOGRAM;  Surgeon: Mona Vinie BROCKS, MD;  Location: [facility];  Service: Cardiovascular;  Laterality: N/A;   TRANSESOPHAGEAL ECHOCARDIOGRAM (CATH LAB) N/A 11/28/2023  Procedure: TRANSESOPHAGEAL ECHOCARDIOGRAM;  Surgeon: Wendel Lurena POUR, MD;  [facility];  Service: Cardiovascular;  Laterality: N/A;    Review of Systems:    All systems reviewed and negative except where noted in HPI.    Physical Exam:  LMP  (LMP Unknown)  No LMP recorded (lmp unknown). Patient is postmenopausal.  General: Well-nourished, well-developed in no acute distress.  Lungs: Clear to auscultation bilaterally. Non-labored. Heart: Regular rate and rhythm, no murmurs rubs or gallops.  Abdomen: Bowel sounds are normal; Abdomen is Soft; No hepatosplenomegaly, masses or hernias;   No Abdominal Tenderness; No guarding or rebound tenderness. Neuro: Alert and oriented x 3.  Grossly intact.  Psych: Alert and cooperative, normal mood and affect.   Imaging Studies: No results found.  Labs: CBC    Component Value Date/Time   WBC 3.3 (L) 07/21/2024 1440   WBC 6.2 05/17/2024 0140   RBC 2.96 (L) 07/21/2024 1440   RBC 2.95 (L) 07/21/2024 1439   HGB 11.7 (L) 07/21/2024 1440   HGB 13.3 11/26/2023 1148   HCT 34.3 (L) 07/21/2024 1440   HCT 36.8 11/26/2023 1148   PLT 219 07/21/2024 1440   PLT 681 (H) 11/26/2023 1148   MCV 115.9 (H) 07/21/2024 1440   MCV 120 (H) 11/26/2023 1148   MCH 39.5 (H) 07/21/2024 1440   MCHC 34.1 07/21/2024 1440   RDW 21.8 (H) 07/21/2024 1440   RDW 15.4 11/26/2023 1148   LYMPHSABS 0.5 (L) 07/21/2024 1440   LYMPHSABS 0.5 (L) 10/19/2023 0907   MONOABS 0.3 07/21/2024 1440   EOSABS 0.0 07/21/2024 1440   EOSABS 0.1 10/19/2023 0907   BASOSABS 0.0 07/21/2024 1440   BASOSABS 0.0 10/19/2023 0907    CMP     Component Value Date/Time   NA 138 07/21/2024 1440   NA 139 11/26/2023 1148   K 4.1 07/21/2024 1440   CL 103 07/21/2024 1440   CO2 27 07/21/2024 1440   GLUCOSE 106 (H) 07/21/2024 1440   BUN 11 07/21/2024 1440   BUN 17 11/26/2023 1148   CREATININE 0.72 07/21/2024 1440   CALCIUM  9.9 07/21/2024 1440   PROT 7.0 07/21/2024 1440   PROT 6.7 11/26/2023 1148   ALBUMIN  4.5 07/21/2024 1440   ALBUMIN  4.8 11/26/2023 1148   AST 28 07/21/2024 1440   ALT 17 07/21/2024 1440   ALKPHOS 65 07/21/2024 1440   BILITOT 0.5 07/21/2024 1440   GFRNONAA >60 07/21/2024 1440       Assessment and Plan:   Kristina Osborne is a 73 y.o. y/o female ***  Assessment and Plan Assessment & Plan       Kristina Console, PA-C  Follow up ***

## 2024-08-05 NOTE — Telephone Encounter (Signed)
 Called and let Pt know

## 2024-08-06 ENCOUNTER — Ambulatory Visit: Payer: Self-pay | Admitting: Family Medicine

## 2024-08-06 ENCOUNTER — Ambulatory Visit (INDEPENDENT_AMBULATORY_CARE_PROVIDER_SITE_OTHER): Admitting: Physician Assistant

## 2024-08-06 ENCOUNTER — Encounter: Payer: Self-pay | Admitting: Physician Assistant

## 2024-08-06 ENCOUNTER — Other Ambulatory Visit: Payer: Self-pay | Admitting: Hematology and Oncology

## 2024-08-06 VITALS — BP 100/64 | HR 74 | Ht 66.0 in | Wt 143.0 lb

## 2024-08-06 DIAGNOSIS — Z8719 Personal history of other diseases of the digestive system: Secondary | ICD-10-CM

## 2024-08-06 DIAGNOSIS — Z860100 Personal history of colon polyps, unspecified: Secondary | ICD-10-CM

## 2024-08-06 DIAGNOSIS — K6389 Other specified diseases of intestine: Secondary | ICD-10-CM

## 2024-08-06 DIAGNOSIS — I341 Nonrheumatic mitral (valve) prolapse: Secondary | ICD-10-CM

## 2024-08-06 DIAGNOSIS — K5520 Angiodysplasia of colon without hemorrhage: Secondary | ICD-10-CM

## 2024-08-06 DIAGNOSIS — D75839 Thrombocytosis, unspecified: Secondary | ICD-10-CM

## 2024-08-06 MED ORDER — NA SULFATE-K SULFATE-MG SULF 17.5-3.13-1.6 GM/177ML PO SOLN: 1.0000 | Freq: Once | ORAL | 0 refills | Status: AC

## 2024-08-06 NOTE — Progress Notes (Signed)
 B12 is low, please have patient start a daily B12 vitamin -- she can get this over the counter without a prescription. Usually I recommend at least 500 mcg daily.

## 2024-08-06 NOTE — Patient Instructions (Signed)
 You have been scheduled for a Colonoscopy. Please follow written instructions given to you at your visit today.   If you use inhalers (even only as needed), please bring them with you on the day of your procedure.  DO NOT TAKE 7 DAYS PRIOR TO TEST- Trulicity (dulaglutide) Ozempic, Wegovy (semaglutide) Mounjaro (tirzepatide) Bydureon Bcise (exanatide extended release)  DO NOT TAKE 1 DAY PRIOR TO YOUR TEST Rybelsus (semaglutide) Adlyxin (lixisenatide) Victoza (liraglutide) Byetta (exanatide) ___________________________________________________________________________  Please follow up sooner if symptoms increase or worsen   Due to recent changes in healthcare laws, you may see the results of your imaging and laboratory studies on MyChart before your provider has had a chance to review them.  We understand that in some cases there may be results that are confusing or concerning to you. Not all laboratory results come back in the same time frame and the provider may be waiting for multiple results in order to interpret others.  Please give us  48 hours in order for your provider to thoroughly review all the results before contacting the office for clarification of your results.   Thank you for trusting me with your gastrointestinal care!   Ellouise Console, PA-C _______________________________________________________  If your blood pressure at your visit was 140/90 or greater, please contact your primary care physician to follow up on this.  _______________________________________________________  If you are age 52 or older, your body mass index should be between 23-30. Your Body mass index is 23.08 kg/m. If this is out of the aforementioned range listed, please consider follow up with your Primary Care Provider.  If you are age 73 or younger, your body mass index should be between 19-25. Your Body mass index is 23.08 kg/m. If this is out of the aformentioned range listed, please consider  follow up with your Primary Care Provider.   ________________________________________________________  The [facility] providers would like to encourage you to use MYCHART to communicate with providers for non-urgent requests or questions.  Due to long hold times on the telephone, sending your provider a message by [facility] may be a faster and more efficient way to get a response.  Please allow 48 business hours for a response.  Please remember that this is for non-urgent requests.  _______________________________________________________

## 2024-08-06 NOTE — Addendum Note (Signed)
 Addended by: LANETTE ALETHEA CROME on: 08/06/2024 02:14 PM   Modules accepted: Orders

## 2024-08-07 ENCOUNTER — Inpatient Hospital Stay

## 2024-08-07 ENCOUNTER — Other Ambulatory Visit: Payer: Self-pay | Admitting: Physician Assistant

## 2024-08-07 ENCOUNTER — Inpatient Hospital Stay: Attending: Hematology and Oncology | Admitting: Hematology and Oncology

## 2024-08-07 DIAGNOSIS — D500 Iron deficiency anemia secondary to blood loss (chronic): Secondary | ICD-10-CM

## 2024-08-07 NOTE — Progress Notes (Signed)
 ____________________________________________________________  Attending physician addendum:  Thank you for sending this case to me. I have reviewed the entire note and agree with the plan.  She can stay on my schedule  Victory Brand, MD  ____________________________________________________________

## 2024-08-08 ENCOUNTER — Ambulatory Visit: Payer: Self-pay | Admitting: Physician Assistant

## 2024-08-08 ENCOUNTER — Inpatient Hospital Stay: Attending: Hematology and Oncology

## 2024-08-08 ENCOUNTER — Inpatient Hospital Stay: Admitting: Physician Assistant

## 2024-08-08 VITALS — BP 95/55 | HR 65 | Temp 97.2°F | Resp 16

## 2024-08-08 DIAGNOSIS — Z79899 Other long term (current) drug therapy: Secondary | ICD-10-CM | POA: Insufficient documentation

## 2024-08-08 DIAGNOSIS — K5520 Angiodysplasia of colon without hemorrhage: Secondary | ICD-10-CM | POA: Diagnosis present

## 2024-08-08 DIAGNOSIS — Z87891 Personal history of nicotine dependence: Secondary | ICD-10-CM | POA: Diagnosis not present

## 2024-08-08 DIAGNOSIS — D473 Essential (hemorrhagic) thrombocythemia: Secondary | ICD-10-CM | POA: Insufficient documentation

## 2024-08-08 DIAGNOSIS — E538 Deficiency of other specified B group vitamins: Secondary | ICD-10-CM | POA: Insufficient documentation

## 2024-08-08 DIAGNOSIS — D500 Iron deficiency anemia secondary to blood loss (chronic): Secondary | ICD-10-CM

## 2024-08-08 LAB — CBC WITH DIFFERENTIAL (CANCER CENTER ONLY)
Abs Immature Granulocytes: 0.01 K/uL (ref 0.00–0.07)
Basophils Absolute: 0 K/uL (ref 0.0–0.1)
Basophils Relative: 0 %
Eosinophils Absolute: 0 K/uL (ref 0.0–0.5)
Eosinophils Relative: 0 %
HCT: 30.8 % — ABNORMAL LOW (ref 36.0–46.0)
Hemoglobin: 10.6 g/dL — ABNORMAL LOW (ref 12.0–15.0)
Immature Granulocytes: 0 %
Lymphocytes Relative: 20 %
Lymphs Abs: 0.5 K/uL — ABNORMAL LOW (ref 0.7–4.0)
MCH: 40.9 pg — ABNORMAL HIGH (ref 26.0–34.0)
MCHC: 34.4 g/dL (ref 30.0–36.0)
MCV: 118.9 fL — ABNORMAL HIGH (ref 80.0–100.0)
Monocytes Absolute: 0.3 K/uL (ref 0.1–1.0)
Monocytes Relative: 13 %
Neutro Abs: 1.6 K/uL — ABNORMAL LOW (ref 1.7–7.7)
Neutrophils Relative %: 67 %
Platelet Count: 489 K/uL — ABNORMAL HIGH (ref 150–400)
RBC: 2.59 MIL/uL — ABNORMAL LOW (ref 3.87–5.11)
RDW: 22.6 % — ABNORMAL HIGH (ref 11.5–15.5)
WBC Count: 2.5 K/uL — ABNORMAL LOW (ref 4.0–10.5)
nRBC: 0 % (ref 0.0–0.2)

## 2024-08-08 LAB — CMP (CANCER CENTER ONLY)
ALT: 16 U/L (ref 0–44)
AST: 28 U/L (ref 15–41)
Albumin: 4.3 g/dL (ref 3.5–5.0)
Alkaline Phosphatase: 58 U/L (ref 38–126)
Anion gap: 9 (ref 5–15)
BUN: 12 mg/dL (ref 8–23)
CO2: 26 mmol/L (ref 22–32)
Calcium: 9.7 mg/dL (ref 8.9–10.3)
Chloride: 104 mmol/L (ref 98–111)
Creatinine: 0.8 mg/dL (ref 0.44–1.00)
GFR, Estimated: >60 mL/min (ref >60)
Glucose, Bld: 112 mg/dL — ABNORMAL HIGH (ref 70–99)
Potassium: 4.3 mmol/L (ref 3.5–5.1)
Sodium: 139 mmol/L (ref 135–145)
Total Bilirubin: 0.3 mg/dL (ref 0.0–1.2)
Total Protein: 6.6 g/dL (ref 6.5–8.1)

## 2024-08-08 LAB — FERRITIN: Ferritin: 64 ng/mL (ref 11–307)

## 2024-08-08 NOTE — Progress Notes (Signed)
 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 Federico  10/26/2021: WBC 6.7, Hgb 13.3, MCV 121.7, Plt 204 03/10/2022: WBC 3.7, Hgb 11.2, MCV 120, Plt 261. Decrease hydroxyurea  to 1000 mg BID due to cytopenias.  06/24/2022-06/27/2022: Admitted with worsening GI bleed. Hydroxyurea  held.  08/01/2022: WBC 4.4, hemoglobin 9.8, MCV 105.1, and platelets of 190 02/26/2023: WBC 7.3, Hgb 12.5, MCV 109.1, Plt 807. Increase hydroxyurea  to 1000 mg daily with 1500 mg every other day 04/27/2023: WBC 4.8, Hgb 12.5, MCV 111.3, Plt 528. Increase hydroxyurea  to 1500 mg daily 06/20/2023: Return dose to hydroxyurea  500 mg twice daily 07/17/2023: increased to 1500 mg hydroxyurea  daily (1000 in PM and 500 in AM)   Interval History:  Kristina Osborne 73 y.o. female with medical history significant for essential thrombocytosis who presents for a follow up visit. The patient's last visit was on 04/10/2024. In the interim since the last visit Kristina Osborne has continued 1500 mg hydroxyurea  daily (1000 in PM and 500 in AM) on T Th Sat Sun and 2000 mg PO daily (1000 mg BID) on MWF.   On exam today Kristina Osborne reports she is tolerating Hydrea  therapy without any side effects.  She does have fatigue but she is able to complete her daily activities on her own.  She has noticed some new joint pain specifically in her right knee which she has contributed to her arthritis and bursitis.  She denies any  signs of bleeding including hematochezia, melena, nosebleeds or gum bleeding.  She has noticed some neuropathy in her feet and fingers and was recently diagnosed with B12 deficiency.  She started on oral B12 supplementation earlier this week.  She denies fevers, chills, night sweats, shortness of breath, chest pain or cough.  She has no other complaints.  Rest the 10 point ROS as below.  MEDICAL HISTORY:  Past Medical History:  Diagnosis Date   ABLA (acute blood loss anemia) 04/16/2022   Congenital malformation of intestinal fixation (HCC)    Gastritis    Gastroesophageal reflux disease without esophagitis 09/10/2021   IDA (iron  deficiency anemia)    Mitral valve prolapse    Persistent hyperplasia of thymus    S/P Thymectomy   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 Wendel   Thrombocytosis     SURGICAL HISTORY: Past Surgical History:  Procedure Laterality Date   BIOPSY  06/26/2022   Procedure: BIOPSY;  Surgeon: Stacia Glendia BRAVO, MD;  Location: [facility];  Service: Gastroenterology;;   COLONOSCOPY N/A 04/17/2022   Procedure: COLONOSCOPY;  Surgeon: Albertus Gordy HERO, MD;  Location: [facility];  Service: Gastroenterology;  Laterality: N/A;   COLONOSCOPY N/A 04/24/2024   Procedure: COLONOSCOPY;  Surgeon: Legrand Victory LITTIE DOUGLAS, MD;  Location: [facility];  Service: Gastroenterology;  Laterality: N/A;   COLONOSCOPY WITH PROPOFOL  N/A 06/26/2022   Procedure: COLONOSCOPY WITH PROPOFOL ;  Surgeon: Stacia Glendia BRAVO, MD;  Location: [facility];  Service: Gastroenterology;  Laterality: N/A;   ENTEROSCOPY N/A 04/17/2022   Procedure: ENTEROSCOPY;  Surgeon: Albertus Gordy HERO, MD;  Location: [facility];  Service: Gastroenterology;  Laterality: N/A;   ENTEROSCOPY N/A 06/26/2022   Procedure: ENTEROSCOPY;  Surgeon: Stacia Glendia BRAVO, MD;  Location: [facility];  Service: Gastroenterology;  Laterality: N/A;    ENTEROSCOPY N/A 04/23/2024   Procedure: ENTEROSCOPY;  Surgeon: Legrand Victory LITTIE DOUGLAS, MD;  Location: [facility];  Service: Gastroenterology;  Laterality: N/A;   GIVENS CAPSULE STUDY N/A 06/26/2022   Procedure: GIVENS CAPSULE STUDY;  Surgeon: Stacia Glendia BRAVO, MD;  Location: [facility];  Service: Gastroenterology;  Laterality: N/A;   GIVENS CAPSULE STUDY N/A 04/24/2024   Procedure: IMAGING PROCEDURE, GI TRACT, INTRALUMINAL, VIA CAPSULE;  Surgeon: Legrand Victory LITTIE DOUGLAS, MD;  Location: [facility];  Service: Gastroenterology;  Laterality: N/A;   HEMOSTASIS CLIP PLACEMENT  04/17/2022   Procedure: HEMOSTASIS CLIP PLACEMENT;  Surgeon: Albertus Gordy HERO, MD;  Location: [facility];  Service: Gastroenterology;;   HEMOSTASIS CLIP PLACEMENT  06/26/2022   Procedure: HEMOSTASIS CLIP PLACEMENT;  Surgeon: Stacia Glendia BRAVO, MD;  Location: [facility];  Service: Gastroenterology;;   HOT HEMOSTASIS N/A 04/17/2022   Procedure: HOT HEMOSTASIS (ARGON PLASMA COAGULATION/BICAP);  Surgeon: Albertus Gordy HERO, MD;  Location: [facility];  Service: Gastroenterology;  Laterality: N/A;   HOT HEMOSTASIS N/A 06/26/2022   Procedure: HOT HEMOSTASIS (ARGON PLASMA COAGULATION/BICAP);  Surgeon: Stacia Glendia BRAVO, MD;  Location: [facility];  Service: Gastroenterology;  Laterality: N/A;   POLYPECTOMY  04/17/2022   Procedure: POLYPECTOMY;  Surgeon: Albertus Gordy HERO, MD;  Location: [facility];  Service: Gastroenterology;;   POLYPECTOMY  06/26/2022   Procedure: POLYPECTOMY;  Surgeon: Stacia Glendia BRAVO, MD;  Location: [facility];  Service: Gastroenterology;;   RIGHT/LEFT HEART CATH AND CORONARY ANGIOGRAPHY N/A 11/01/2023   Procedure: RIGHT/LEFT HEART CATH AND CORONARY ANGIOGRAPHY;  Surgeon: Darron Deatrice LABOR, MD;  Location: [facility];  Service: Cardiovascular;  Laterality: N/A;   SMALL INTESTINE SURGERY  1994   SUBMUCOSAL TATTOO INJECTION  06/26/2022   Procedure: SUBMUCOSAL TATTOO INJECTION;  Surgeon: Stacia Glendia BRAVO,  MD;  Location: [facility];  Service: Gastroenterology;;   THYMECTOMY N/A    TRANSCATHETER MITRAL EDGE TO EDGE REPAIR N/A 11/28/2023   Procedure: TRANSCATHETER MITRAL EDGE TO EDGE REPAIR;  Surgeon: Wendel Lurena POUR, MD;  Location: [facility];  Service: Cardiovascular;  Laterality: N/A;   TRANSESOPHAGEAL ECHOCARDIOGRAM (CATH LAB) N/A 10/24/2023   Procedure: TRANSESOPHAGEAL ECHOCARDIOGRAM;  Surgeon: Mona Vinie BROCKS, MD;  Location: [facility];  Service: Cardiovascular;  Laterality: N/A;   TRANSESOPHAGEAL ECHOCARDIOGRAM (CATH LAB) N/A 11/28/2023   Procedure: TRANSESOPHAGEAL ECHOCARDIOGRAM;  Surgeon: Wendel Lurena POUR, MD;  Location: [facility];  Service: Cardiovascular;  Laterality: N/A;    SOCIAL HISTORY: Social History   Socioeconomic History   Marital status: Married    Spouse name: Not on file   Number of children: 0   Years of education: Not on file   Highest education level: Not on file  Occupational History   Not on file  Tobacco Use   Smoking status: Former    Current packs/day: 0.00    Types: Cigarettes    Quit date: 2002    Years since quitting: 23.9   Smokeless tobacco: Never   Tobacco comments:    Started smoking in her early 18's.    Smoked 0.5PP at her heaviest.   Vaping Use   Vaping status: Never Used  Substance and Sexual Activity  Alcohol use: Not Currently   Drug use: Never   Sexual activity: Yes    Partners: Male  Other Topics Concern   Not on file  Social History Narrative   Married   Social Drivers of Health   Tobacco Use: Medium Risk (08/06/2024)   Patient History    Smoking Tobacco Use: Former    Smokeless Tobacco Use: Never    Passive Exposure: Not on Actuary Strain: Low Risk (06/03/2024)   Overall Financial Resource Strain (CARDIA)    Difficulty of Paying Living Expenses: Not hard at all  Food Insecurity: No Food Insecurity (06/03/2024)   Epic    Worried About Programme Researcher, Broadcasting/film/video in the Last Year: Never  true    Ran Out of Food in the Last Year: Never true  Transportation Needs: No Transportation Needs (06/03/2024)   Epic    Lack of Transportation (Medical): No    Lack of Transportation (Non-Medical): No  Physical Activity: Sufficiently Active (06/03/2024)   Exercise Vital Sign    Days of Exercise per Week: 7 days    Minutes of Exercise per Session: 60 min  Stress: No Stress Concern Present (06/03/2024)   [employer] of Occupational Health - Occupational Stress Questionnaire    Feeling of Stress: Not at all  Social Connections: Socially Integrated (06/03/2024)   Social Connection and Isolation Panel    Frequency of Communication with Friends and Family: More than three times a week    Frequency of Social Gatherings with Friends and Family: More than three times a week    Attends Religious Services: More than 4 times per year    Active Member of [employer] or Organizations: Yes    Attends Banker Meetings: More than 4 times per year    Marital Status: Married  Catering Manager Violence: Not At Risk (06/03/2024)   Epic    Fear of Current or Ex-Partner: No    Emotionally Abused: No    Physically Abused: No    Sexually Abused: No  Depression (PHQ2-9): Low Risk (06/03/2024)   Depression (PHQ2-9)    PHQ-2 Score: 0  Alcohol Screen: Low Risk (06/03/2024)   Alcohol Screen    Last Alcohol Screening Score (AUDIT): 0  Housing: Unknown (06/03/2024)   Epic    Unable to Pay for Housing in the Last Year: No    Number of Times Moved in the Last Year: Not on file    Homeless in the Last Year: No  Utilities: Not At Risk (06/03/2024)   Epic    Threatened with loss of utilities: No  Health Literacy: Adequate Health Literacy (06/03/2024)   B1300 Health Literacy    Frequency of need for help with medical instructions: Never    FAMILY HISTORY: Family History  Problem Relation Age of Onset   Heart disease Mother    Valvular heart disease Mother    Healthy Sister    Alcoholism  Half-Sister     ALLERGIES:  is allergic to feraheme [ferumoxytol ], aspirin, and sulfa antibiotics.  MEDICATIONS:  Current Outpatient Medications  Medication Sig Dispense Refill   acetaminophen  (TYLENOL ) 500 MG tablet Take 1,000 mg by mouth every 6 (six) hours as needed for headache (pain.).     famotidine  (PEPCID ) 40 MG tablet TAKE 1 TABLET(40 MG) BY MOUTH DAILY 90 tablet 0   folic acid  (FOLVITE ) 1 MG tablet TAKE 1 TABLET(1 MG) BY MOUTH DAILY 90 tablet 1   hydroxyurea  (HYDREA ) 500 MG capsule TAKE 1 CAPSULE(500 MG)  BY MOUTH TWICE DAILY 120 capsule 2   OVER THE COUNTER MEDICATION OTC medication for macular degeneration-cannot recall name     sucralfate  (CARAFATE ) 1 g tablet TAKE 1 TABLET(1 GRAM) BY MOUTH FOUR TIMES DAILY AT BEDTIME WITH MEALS 360 tablet 1   traMADol  (ULTRAM ) 50 MG tablet Take 1 tablet (50 mg total) by mouth every 8 (eight) hours as needed for up to 5 days. 15 tablet 0   tretinoin  (RETIN-A ) 0.05 % cream Apply topically at bedtime. 45 g 4   No current facility-administered medications for this visit.    REVIEW OF SYSTEMS:   Constitutional: ( - ) fevers, ( - )  chills , ( - ) night sweats Eyes: ( - ) blurriness of vision, ( - ) double vision, ( - ) watery eyes Ears, nose, mouth, throat, and face: ( - ) mucositis, ( - ) sore throat Respiratory: ( - ) cough, ( - ) dyspnea, ( - ) wheezes Cardiovascular: ( - ) palpitation, ( - ) chest discomfort, ( - ) lower extremity swelling Gastrointestinal:  ( - ) nausea, ( - ) heartburn, ( - ) change in bowel habits Skin: ( - ) abnormal skin rashes Lymphatics: ( - ) new lymphadenopathy, ( - ) easy bruising Neurological: ( - ) numbness, ( - ) tingling, ( - ) new weaknesses Behavioral/Psych: ( - ) mood change, ( - ) new changes  All other systems were reviewed with the patient and are negative.  PHYSICAL EXAMINATION:  Vitals:   08/08/24 1311 08/08/24 1312  BP: (!) 89/60 (!) 95/55  Pulse: 65   Resp: 16   Temp: (!) 97.2 F (36.2 C)    SpO2: 100%     GENERAL: well appearing elderly Caucasian female, in NAD  SKIN: skin color, texture, turgor are normal, no rashes or significant lesions EYES: conjunctiva are pink and non-injected, sclera clear LUNGS: clear to auscultation and percussion with normal breathing effort HEART: regular rate & rhythm and no murmurs and no lower extremity edema Musculoskeletal: no cyanosis of digits and no clubbing  PSYCH: alert & oriented x 3, fluent speech NEURO: no focal motor/sensory deficits   LABORATORY DATA:  I have reviewed the data as listed    Latest Ref Rng & Units 08/08/2024   12:40 PM 07/21/2024    2:40 PM 07/07/2024   10:31 AM  CBC  WBC 4.0 - 10.5 K/uL 2.5  3.3  3.1   Hemoglobin 12.0 - 15.0 g/dL 89.3  88.2  87.4   Hematocrit 36.0 - 46.0 % 30.8  34.3  36.6   Platelets 150 - 400 K/uL 489  219  427        Latest Ref Rng & Units 07/21/2024    2:40 PM 07/07/2024   10:31 AM 06/23/2024   10:17 AM  CMP  Glucose 70 - 99 mg/dL 893  898  96   BUN 8 - 23 mg/dL 11  13  14    Creatinine 0.44 - 1.00 mg/dL 9.27  9.23  9.19   Sodium 135 - 145 mmol/L 138  139  138   Potassium 3.5 - 5.1 mmol/L 4.1  4.3  4.3   Chloride 98 - 111 mmol/L 103  106  103   CO2 22 - 32 mmol/L 27  27  31    Calcium  8.9 - 10.3 mg/dL 9.9  9.9  89.7   Total Protein 6.5 - 8.1 g/dL 7.0  7.0  7.2   Total Bilirubin 0.0 - 1.2 mg/dL  0.5  0.4  0.4   Alkaline Phos 38 - 126 U/L 65  62  60   AST 15 - 41 U/L 28  26  23    ALT 0 - 44 U/L 17  15  14     RADIOGRAPHIC STUDIES: No results found.   ASSESSMENT & PLAN Kristina Osborne is a 73 y.o. female with medical history significant for essential thrombocytosis who presents for a follow up visit.    # Essential thrombocythemia, JAK2 Positive  -- At this time we will need to confirm that the patient has essential thrombocythemia.  She will require a bone marrow biopsy to confirm this diagnosis --JAK2 testing received from patient's prior hematologist. Confirmed JAK2  mutation. Plan:   --Labs today show WBC 2.5, Hgb 10.6, Plt 489, creatinine and LFTs are normal. Ferritin is normal. --Currently on Hydrea  1500 mg hydroxyurea  daily (1000 in PM and 500 in AM) on T Th Sat Sun and 2000 mg PO daily (1000 mg BID) on MWF.  --Advise to continue current role of Hydrea  therapy and repeat labs in 4 weeks as patient's B12 deficiency can contribute to her cytopenias.  --Dr. Federico is hesitant to switch to anagrelide in the future given her proclivity for bleeding. --RTC in 4 weeks with repeat labs.   #B12 deficiency: --Recently diagnosed based on labs from 08/04/2024. Started on B12 supplementation 500 mg PO daily.   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  More recent doses were performed on 11/07/2022, 01/18/2023, and most recently 06/26/2023. Feraheme administered on 2/7-2/14/2025.  --Patient has history of GI bleeding. Patient notes no active maroon/red/black stools.  Appreciate assistance of GI in the management of this case. --Labs today as noted above.  -- If the patient were to have recurrent anemia or persistent iron  deficiency will order another dose of IV iron    No orders of the defined types were placed in this encounter.   All questions were answered. The patient knows to call the clinic with any problems, questions or concerns.   I have spent a total of 25 minutes minutes of face-to-face and non-face-to-face time, preparing to see the patient, performing a medically appropriate examination, counseling and educating the patient,documenting clinical information in the electronic health record, independently interpreting results and communicating results to the patient, and care coordination.   Johnston Police PA-C Dept of Hematology and Oncology [facility] Phone:  [phone]   08/08/2024 1:16 PM

## 2024-08-11 NOTE — Progress Notes (Unsigned)
 Kristina Osborne [facility] [address] [place] [address] Phone: [phone]   Assessment and Plan:     1. Primary osteoarthritis of right knee (Primary) 2. Chronic pain of right knee -Chronic with exacerbation, initial sports medicine visit - Most consistent with osteoarthritis of right knee primarily medial compartment - Reviewed patient's x-ray which showed mild tricompartmental degenerative changes, worse in the medial compartment - Start HEP and physical therapy for knee - Do not recommend p.o. NSAIDs with history of GI bleed - Patient had mild and temporary relief with p.o. prednisone .  Will not repeat p.o. steroids course at this time. - Patient elected for intra-articular CSI.  Tolerated well per note below  15 additional minutes spent for educating Therapeutic Home Exercise Program.  This included exercises focusing on stretching, strengthening, with focus on eccentric aspects.   Long term goals include an improvement in range of motion, strength, endurance as well as avoiding reinjury. Patient's frequency would include in 1-2 times a day, 3-5 times a week for a duration of 6-12 weeks. Proper technique shown and discussed handout in great detail with ATC.  All questions were discussed and answered.     Procedure: Knee Joint Injection Side: Right Indication: Flare of osteoarthritis  Risks explained and consent was given verbally. The site was cleaned with alcohol prep. A needle was introduced with an anterio-lateral approach. Injection given using 2mL of 1% lidocaine  without epinephrine  and 1mL of kenalog  40mg /ml. This was well tolerated.  Needle was removed, hemostasis achieved, and post injection instructions were explained.   Pt was advised to call or return to clinic if these symptoms worsen or fail to improve as anticipated.   Pertinent previous records reviewed include right knee x-ray, primary care note   Follow Up: 4 to 6 weeks  for reevaluation.  Could consider alternative injection therapy versus advanced imaging   Subjective:   I, Sly Parlee, am serving as a neurosurgeon for Doctor Morene Mace  Chief Complaint: right knee pain   HPI:   08/12/2024 Patient is a 73 year old female with right knee pain. Patient states pain started 2 weeks ago.  No MOI. Hx of medial meniscus tear. Prednisone  helped for a couple of days. No radiating pain. Tylneol doesn't help with the pain. Tramadol  was also given. Notes she has had some numbness down to her toes with cramping going up to her groin. Decreased ROM. She does endorse a burning sensation   Relevant Historical Information: History of GI bleed, HFpEF, thrombocytosis  Additional pertinent review of systems negative.  Current Medications[1]   Objective:     Vitals:   08/12/24 1118  Pulse: 69  SpO2: 97%  Weight: 143 lb (64.9 kg)  Height: 5' 6 (1.676 m)      Body mass index is 23.08 kg/m.    Physical Exam:    General:  awake, alert oriented, no acute distress nontoxic Skin: no suspicious lesions or rashes Neuro:sensation intact and strength 5/5 with no deficits, no atrophy, normal muscle tone Psych: No signs of anxiety, depression or other mood disorder  Right knee: Mild swelling No deformity Neg fluid wave, joint milking ROM Flex 110, Ext 0 TTP medial femoral condyle NTTP over the quad tendon,   lat fem condyle, patella, patella tendon, tibial tuberostiy, fibular head, posterior fossa, pes anserine bursa, gerdy's tubercle, medial jt line, lateral jt line Neg anterior and posterior drawer Neg lachman Negative varus stress Negative valgus stress for laxity, though  reproduced medial knee pain Negative McMurray for palpable pop, though reproduced medial knee pain Positive Thessaly  Gait normal    Electronically signed by:  Odis Mace Osborne [facility] 11:45 AM 08/12/2024     [1]  Current Outpatient Medications:     acetaminophen  (TYLENOL ) 500 MG tablet, Take 1,000 mg by mouth every 6 (six) hours as needed for headache (pain.)., Disp: , Rfl:    folic acid  (FOLVITE ) 1 MG tablet, TAKE 1 TABLET(1 MG) BY MOUTH DAILY, Disp: 90 tablet, Rfl: 1   hydroxyurea  (HYDREA ) 500 MG capsule, TAKE 1 CAPSULE(500 MG) BY MOUTH TWICE DAILY, Disp: 120 capsule, Rfl: 2   OVER THE COUNTER MEDICATION, OTC medication for macular degeneration-cannot recall name, Disp: , Rfl:    sucralfate  (CARAFATE ) 1 g tablet, TAKE 1 TABLET(1 GRAM) BY MOUTH FOUR TIMES DAILY AT BEDTIME WITH MEALS, Disp: 360 tablet, Rfl: 1   tretinoin  (RETIN-A ) 0.05 % cream, Apply topically at bedtime., Disp: 45 g, Rfl: 4   famotidine  (PEPCID ) 40 MG tablet, TAKE 1 TABLET(40 MG) BY MOUTH DAILY, Disp: 90 tablet, Rfl: 0

## 2024-08-12 ENCOUNTER — Ambulatory Visit: Admitting: Sports Medicine

## 2024-08-12 VITALS — HR 69 | Ht 66.0 in | Wt 143.0 lb

## 2024-08-12 DIAGNOSIS — M25561 Pain in right knee: Secondary | ICD-10-CM | POA: Diagnosis not present

## 2024-08-12 DIAGNOSIS — M1711 Unilateral primary osteoarthritis, right knee: Secondary | ICD-10-CM

## 2024-08-12 DIAGNOSIS — G8929 Other chronic pain: Secondary | ICD-10-CM

## 2024-08-12 NOTE — Patient Instructions (Addendum)
 Knee HEP   Tylenol  [phone] mg 2-3 times a day for pain relief   PT referral   6 week follow up

## 2024-08-13 ENCOUNTER — Telehealth: Payer: Self-pay | Admitting: *Deleted

## 2024-08-13 MED ORDER — CYANOCOBALAMIN 1000 MCG PO CAPS: 1000.0000 ug | ORAL_CAPSULE | Freq: Every day | ORAL | 3 refills | Status: AC

## 2024-08-13 NOTE — Telephone Encounter (Signed)
 Received vm message from pt to call her regarding her fatigue. TCT patient . Spoke with her. She is asking if she can have an iron  infusion as she is feeling very fatigued and is having muscle cramps.at night. Advised that her Ferritin is very good @ 64 and she does not need IV iron  at this time. Advised that her B12 is very low. She states she is taking 500 mcg of B12 orally. Advised that Dr. Federico recommends she take 1000mcg daily. If she does not feel better in a couple of weeks, to call back and we can set her up for B12 injections. Pt voiced understanding and will increase her B12 to 1000mcg daily.

## 2024-08-14 ENCOUNTER — Telehealth: Payer: Self-pay | Admitting: Sports Medicine

## 2024-08-14 NOTE — Telephone Encounter (Signed)
 Patient informed.

## 2024-08-14 NOTE — Telephone Encounter (Signed)
 Messaged via mychart will call to follow up

## 2024-08-14 NOTE — Telephone Encounter (Signed)
 Pt had a steroid injection 12/16, knee pain has increased and pt thinks she is having a reaction to the steroid. Facial flushing and a concern about shortness of breath.  Pt wondering how long the reaction will last and what to do about the increased knee pain. Currently using ice with no benefit.

## 2024-08-14 NOTE — Telephone Encounter (Signed)
 Patient called back. I read her Dr Keller response and advised her to view the message on MyChart as well. She expressed understanding.  She also mentioned that she feels jittery and was concerned about cardiac issues. She said that it feels like she drank a cup of coffee and feels a little off but her heart rate has been fine so assumes that could be from the steroid as well.

## 2024-08-15 ENCOUNTER — Telehealth: Payer: Self-pay | Admitting: Sports Medicine

## 2024-08-15 NOTE — Telephone Encounter (Signed)
 Patient called and thinks she is having deconditioning. She would like to know if she can use exercise bike before rehab or if she should wait. She would like to know what is recommended other than the exercise bike to keep her from getting deconditioned. She states she knows she is low on B12 so that is part of it as well for how she is feeling fatigued. Please give patient a call as she does not use mychart. Please advise.

## 2024-08-26 ENCOUNTER — Ambulatory Visit: Admitting: Neurology

## 2024-08-29 ENCOUNTER — Telehealth: Payer: Self-pay | Admitting: Internal Medicine

## 2024-08-29 MED ORDER — AMOXICILLIN 500 MG PO TABS: 2000.0000 mg | ORAL_TABLET | Freq: Once | ORAL | 0 refills | Status: AC

## 2024-08-29 NOTE — Telephone Encounter (Signed)
 Patient states she will be having dental work soon and will need antibiotic prophylaxis.  Pt s/p mTEER requiring SBE prophylaxis.  Amoxicllin 2,000 mg to be taken 1 hour prior to dental procedure sent to [facility] pharmacy.  Patient verbalized understanding and expressed appreciation for call.

## 2024-08-29 NOTE — Telephone Encounter (Signed)
 Pt has questions about antibiotics before a dental procedure. Please advise.

## 2024-09-02 NOTE — Telephone Encounter (Signed)
 Pt calling stating she only had a flushing at dental office today and some flossing in an area that she couldn't reach. Deep cleaning and replacement needed. Asking for auth clearance and advised patient that dental office would need to fax that form to our office. Fax number given. Advised pt if she did receive further antibiotics to be sure and take 1  hour prior to procedure, as she stated she took after flossing today. Will send to Dr Mona for awareness. Pt verbalizes understanding of plan.

## 2024-09-02 NOTE — Telephone Encounter (Signed)
 Pt would like a call back concerning dental work please advise

## 2024-09-12 ENCOUNTER — Telehealth: Payer: Self-pay

## 2024-09-12 ENCOUNTER — Other Ambulatory Visit: Payer: Self-pay | Admitting: Hematology and Oncology

## 2024-09-12 ENCOUNTER — Inpatient Hospital Stay: Attending: Hematology and Oncology

## 2024-09-12 ENCOUNTER — Inpatient Hospital Stay (HOSPITAL_BASED_OUTPATIENT_CLINIC_OR_DEPARTMENT_OTHER): Admitting: Hematology and Oncology

## 2024-09-12 VITALS — BP 113/67 | HR 63 | Temp 97.9°F | Resp 13

## 2024-09-12 DIAGNOSIS — D500 Iron deficiency anemia secondary to blood loss (chronic): Secondary | ICD-10-CM

## 2024-09-12 DIAGNOSIS — D473 Essential (hemorrhagic) thrombocythemia: Secondary | ICD-10-CM

## 2024-09-12 DIAGNOSIS — K5520 Angiodysplasia of colon without hemorrhage: Secondary | ICD-10-CM | POA: Diagnosis not present

## 2024-09-12 LAB — CBC WITH DIFFERENTIAL (CANCER CENTER ONLY)
Abs Immature Granulocytes: 0.01 K/uL (ref 0.00–0.07)
Basophils Absolute: 0 K/uL (ref 0.0–0.1)
Basophils Relative: 0 %
Eosinophils Absolute: 0 K/uL (ref 0.0–0.5)
Eosinophils Relative: 0 %
HCT: 32.8 % — ABNORMAL LOW (ref 36.0–46.0)
Hemoglobin: 11.1 g/dL — ABNORMAL LOW (ref 12.0–15.0)
Immature Granulocytes: 0 %
Lymphocytes Relative: 19 %
Lymphs Abs: 0.4 K/uL — ABNORMAL LOW (ref 0.7–4.0)
MCH: 44.4 pg — ABNORMAL HIGH (ref 26.0–34.0)
MCHC: 33.8 g/dL (ref 30.0–36.0)
MCV: 131.2 fL — ABNORMAL HIGH (ref 80.0–100.0)
Monocytes Absolute: 0.2 K/uL (ref 0.1–1.0)
Monocytes Relative: 8 %
Neutro Abs: 1.6 K/uL — ABNORMAL LOW (ref 1.7–7.7)
Neutrophils Relative %: 73 %
Platelet Count: 558 K/uL — ABNORMAL HIGH (ref 150–400)
RBC: 2.5 MIL/uL — ABNORMAL LOW (ref 3.87–5.11)
RDW: 16.1 % — ABNORMAL HIGH (ref 11.5–15.5)
WBC Count: 2.3 K/uL — ABNORMAL LOW (ref 4.0–10.5)
nRBC: 0 % (ref 0.0–0.2)

## 2024-09-12 LAB — CMP (CANCER CENTER ONLY)
ALT: 17 U/L (ref 0–44)
AST: 25 U/L (ref 15–41)
Albumin: 4.7 g/dL (ref 3.5–5.0)
Alkaline Phosphatase: 62 U/L (ref 38–126)
Anion gap: 9 (ref 5–15)
BUN: 11 mg/dL (ref 8–23)
CO2: 28 mmol/L (ref 22–32)
Calcium: 10 mg/dL (ref 8.9–10.3)
Chloride: 104 mmol/L (ref 98–111)
Creatinine: 0.79 mg/dL (ref 0.44–1.00)
GFR, Estimated: >60 mL/min
Glucose, Bld: 104 mg/dL — ABNORMAL HIGH (ref 70–99)
Potassium: 4.2 mmol/L (ref 3.5–5.1)
Sodium: 140 mmol/L (ref 135–145)
Total Bilirubin: 0.4 mg/dL (ref 0.0–1.2)
Total Protein: 7.2 g/dL (ref 6.5–8.1)

## 2024-09-12 LAB — FERRITIN: Ferritin: 40 ng/mL (ref 11–307)

## 2024-09-12 LAB — RETIC PANEL
Immature Retic Fract: 29.7 % — ABNORMAL HIGH (ref 2.3–15.9)
RBC.: 2.5 MIL/uL — ABNORMAL LOW (ref 3.87–5.11)
Retic Count, Absolute: 49 K/uL (ref 19.0–186.0)
Retic Ct Pct: 2 % (ref 0.4–3.1)
Reticulocyte Hemoglobin: 38.1 pg

## 2024-09-12 LAB — IRON AND IRON BINDING CAPACITY (CC-WL,HP ONLY)
Iron: 70 ug/dL (ref 28–170)
Saturation Ratios: 20 % (ref 10.4–31.8)
TIBC: 346 ug/dL (ref 250–450)
UIBC: 276 ug/dL

## 2024-09-12 LAB — VITAMIN B12: Vitamin B-12: 347 pg/mL (ref 180–914)

## 2024-09-12 NOTE — Progress Notes (Signed)
 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 Federico  10/26/2021: WBC 6.7, Hgb 13.3, MCV 121.7, Plt 204 03/10/2022: WBC 3.7, Hgb 11.2, MCV 120, Plt 261. Decrease hydroxyurea  to 1000 mg BID due to cytopenias.  06/24/2022-06/27/2022: Admitted with worsening GI bleed. Hydroxyurea  held.  08/01/2022: WBC 4.4, hemoglobin 9.8, MCV 105.1, and platelets of 190 02/26/2023: WBC 7.3, Hgb 12.5, MCV 109.1, Plt 807. Increase hydroxyurea  to 1000 mg daily with 1500 mg every other day 04/27/2023: WBC 4.8, Hgb 12.5, MCV 111.3, Plt 528. Increase hydroxyurea  to 1500 mg daily 06/20/2023: Return dose to hydroxyurea  500 mg twice daily 07/17/2023: increased to 1500 mg hydroxyurea  daily (1000 in PM and 500 in AM)   Interval History:  Caytlyn Evers 74 y.o. female with medical history significant for essential thrombocytosis who presents for a follow up visit. The patient's last visit was on 04/10/2024. In the interim since the last visit Ms. Gardiner has continued 1500 mg hydroxyurea  daily (1000 in PM and 500 in AM) on T Th Sat Sun and 2000 mg PO daily (1000 mg BID) on MWF.   On exam today Mrs. Garbett reports her husband is currently in the hospital with an infection but does appear to be improving.  She herself has been doing well.  Other than some fatigue she reports that she is not having any bleeding, bruising, or dark stools.  She is not having any lightheadedness, dizziness,  shortness of breath.  She continues taking her hydroxyurea  as prescribed but unfortunately there has been an increase in the levels of her platelets.  She reports that she has not had any ulcers in her mouth or on her ankles.  She denies any nausea, vomiting, or diarrhea.  Overall she feels well and has no additional questions concerns or complaints today.  A full 10 point ROS is otherwise negative.  MEDICAL HISTORY:  Past Medical History:  Diagnosis Date   ABLA (acute blood loss anemia) 04/16/2022   Congenital malformation of intestinal fixation (HCC)    Gastritis    Gastroesophageal reflux disease without esophagitis 09/10/2021   IDA (iron  deficiency anemia)    Mitral valve prolapse    Persistent hyperplasia of thymus    S/P Thymectomy   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 Wendel   Thrombocytosis     SURGICAL HISTORY: Past Surgical History:  Procedure Laterality Date   BIOPSY  06/26/2022   Procedure: BIOPSY;  Surgeon: Stacia Glendia BRAVO, MD;  Location: [facility];  Service: Gastroenterology;;   COLONOSCOPY N/A 04/17/2022   Procedure: COLONOSCOPY;  Surgeon: Albertus Gordy HERO, MD;  Location: [facility];  Service: Gastroenterology;  Laterality: N/A;   COLONOSCOPY N/A 04/24/2024   Procedure: COLONOSCOPY;  Surgeon: Legrand Victory LITTIE DOUGLAS, MD;  Location: [facility];  Service: Gastroenterology;  Laterality: N/A;   COLONOSCOPY WITH PROPOFOL  N/A 06/26/2022   Procedure: COLONOSCOPY WITH PROPOFOL ;  Surgeon: Stacia Glendia BRAVO, MD;  Location: [facility];  Service: Gastroenterology;  Laterality: N/A;   ENTEROSCOPY N/A 04/17/2022   Procedure: ENTEROSCOPY;  Surgeon: Albertus Gordy HERO, MD;  Location: [facility];  Service: Gastroenterology;  Laterality: N/A;   ENTEROSCOPY N/A 06/26/2022   Procedure: ENTEROSCOPY;  Surgeon: Stacia Glendia BRAVO, MD;  Location: [facility];  Service: Gastroenterology;   Laterality: N/A;   ENTEROSCOPY N/A 04/23/2024   Procedure: ENTEROSCOPY;  Surgeon: Legrand Victory LITTIE DOUGLAS, MD;  Location: [facility];  Service: Gastroenterology;  Laterality: N/A;   GIVENS CAPSULE STUDY N/A 06/26/2022   Procedure: GIVENS CAPSULE STUDY;  Surgeon: Stacia Glendia BRAVO, MD;  Location: [facility];  Service: Gastroenterology;  Laterality: N/A;   GIVENS CAPSULE STUDY N/A 04/24/2024   Procedure: IMAGING PROCEDURE, GI TRACT, INTRALUMINAL, VIA CAPSULE;  Surgeon: Legrand Victory LITTIE DOUGLAS, MD;  Location: [facility];  Service: Gastroenterology;  Laterality: N/A;   HEMOSTASIS CLIP PLACEMENT  04/17/2022   Procedure: HEMOSTASIS CLIP PLACEMENT;  Surgeon: Albertus Gordy HERO, MD;  Location: [facility];  Service: Gastroenterology;;   HEMOSTASIS CLIP PLACEMENT  06/26/2022   Procedure: HEMOSTASIS CLIP PLACEMENT;  Surgeon: Stacia Glendia BRAVO, MD;  Location: [facility];  Service: Gastroenterology;;   HOT HEMOSTASIS N/A 04/17/2022   Procedure: HOT HEMOSTASIS (ARGON PLASMA COAGULATION/BICAP);  Surgeon: Albertus Gordy HERO, MD;  Location: [facility];  Service: Gastroenterology;  Laterality: N/A;   HOT HEMOSTASIS N/A 06/26/2022   Procedure: HOT HEMOSTASIS (ARGON PLASMA COAGULATION/BICAP);  Surgeon: Stacia Glendia BRAVO, MD;  Location: [facility];  Service: Gastroenterology;  Laterality: N/A;   POLYPECTOMY  04/17/2022   Procedure: POLYPECTOMY;  Surgeon: Albertus Gordy HERO, MD;  Location: [facility];  Service: Gastroenterology;;   POLYPECTOMY  06/26/2022   Procedure: POLYPECTOMY;  Surgeon: Stacia Glendia BRAVO, MD;  Location: [facility];  Service: Gastroenterology;;   RIGHT/LEFT HEART CATH AND CORONARY ANGIOGRAPHY N/A 11/01/2023   Procedure: RIGHT/LEFT HEART CATH AND CORONARY ANGIOGRAPHY;  Surgeon: Darron Deatrice LABOR, MD;  Location: [facility];  Service: Cardiovascular;  Laterality: N/A;   SMALL INTESTINE SURGERY  1994   SUBMUCOSAL TATTOO INJECTION  06/26/2022   Procedure: SUBMUCOSAL TATTOO INJECTION;  Surgeon:  Stacia Glendia BRAVO, MD;  Location: [facility];  Service: Gastroenterology;;   THYMECTOMY N/A    TRANSCATHETER MITRAL EDGE TO EDGE REPAIR N/A 11/28/2023   Procedure: TRANSCATHETER MITRAL EDGE TO EDGE REPAIR;  Surgeon: Wendel Lurena POUR, MD;  Location: [facility];  Service: Cardiovascular;  Laterality: N/A;   TRANSESOPHAGEAL ECHOCARDIOGRAM (CATH LAB) N/A 10/24/2023   Procedure: TRANSESOPHAGEAL ECHOCARDIOGRAM;  Surgeon: Mona Vinie BROCKS, MD;  Location: [facility];  Service: Cardiovascular;  Laterality: N/A;   TRANSESOPHAGEAL ECHOCARDIOGRAM (CATH LAB) N/A 11/28/2023   Procedure: TRANSESOPHAGEAL ECHOCARDIOGRAM;  Surgeon: Wendel Lurena POUR, MD;  Location: [facility];  Service: Cardiovascular;  Laterality: N/A;    SOCIAL HISTORY: Social History   Socioeconomic History   Marital status: Married    Spouse name: Not on file   Number of children: 0   Years of education: Not on file   Highest education level: Not on file  Occupational History   Not on file  Tobacco Use   Smoking status: Former    Current packs/day: 0.00    Types: Cigarettes    Quit date: 2002    Years since quitting: 24.0   Smokeless tobacco: Never   Tobacco comments:    Started smoking in her early 78's.    Smoked 0.5PP at her heaviest.   Vaping Use   Vaping status: Never Used  Substance  and Sexual Activity   Alcohol use: Not Currently   Drug use: Never   Sexual activity: Yes    Partners: Male  Other Topics Concern   Not on file  Social History Narrative   Married   Social Drivers of Health   Tobacco Use: Medium Risk (08/06/2024)   Patient History    Smoking Tobacco Use: Former    Smokeless Tobacco Use: Never    Passive Exposure: Not on Actuary Strain: Low Risk (06/03/2024)   Overall Financial Resource Strain (CARDIA)    Difficulty of Paying Living Expenses: Not hard at all  Food Insecurity: No Food Insecurity (06/03/2024)   Epic    Worried About Programme Researcher, Broadcasting/film/video in  the Last Year: Never true    Ran Out of Food in the Last Year: Never true  Transportation Needs: No Transportation Needs (06/03/2024)   Epic    Lack of Transportation (Medical): No    Lack of Transportation (Non-Medical): No  Physical Activity: Sufficiently Active (06/03/2024)   Exercise Vital Sign    Days of Exercise per Week: 7 days    Minutes of Exercise per Session: 60 min  Stress: No Stress Concern Present (06/03/2024)   [employer] of Occupational Health - Occupational Stress Questionnaire    Feeling of Stress: Not at all  Social Connections: Socially Integrated (06/03/2024)   Social Connection and Isolation Panel    Frequency of Communication with Friends and Family: More than three times a week    Frequency of Social Gatherings with Friends and Family: More than three times a week    Attends Religious Services: More than 4 times per year    Active Member of [employer] or Organizations: Yes    Attends Banker Meetings: More than 4 times per year    Marital Status: Married  Catering Manager Violence: Not At Risk (06/03/2024)   Epic    Fear of Current or Ex-Partner: No    Emotionally Abused: No    Physically Abused: No    Sexually Abused: No  Depression (PHQ2-9): Low Risk (06/03/2024)   Depression (PHQ2-9)    PHQ-2 Score: 0  Alcohol Screen: Low Risk (06/03/2024)   Alcohol Screen    Last Alcohol Screening Score (AUDIT): 0  Housing: Unknown (06/03/2024)   Epic    Unable to Pay for Housing in the Last Year: No    Number of Times Moved in the Last Year: Not on file    Homeless in the Last Year: No  Utilities: Not At Risk (06/03/2024)   Epic    Threatened with loss of utilities: No  Health Literacy: Adequate Health Literacy (06/03/2024)   B1300 Health Literacy    Frequency of need for help with medical instructions: Never    FAMILY HISTORY: Family History  Problem Relation Age of Onset   Heart disease Mother    Valvular heart disease Mother    Healthy Sister     Alcoholism Half-Sister     ALLERGIES:  is allergic to feraheme [ferumoxytol ], aspirin, and sulfa antibiotics.  MEDICATIONS:  Current Outpatient Medications  Medication Sig Dispense Refill   acetaminophen  (TYLENOL ) 500 MG tablet Take 1,000 mg by mouth every 6 (six) hours as needed for headache (pain.).     Cyanocobalamin  1000 MCG CAPS Take 1,000 mcg by mouth daily. 90 capsule 3   famotidine  (PEPCID ) 40 MG tablet TAKE 1 TABLET(40 MG) BY MOUTH DAILY 90 tablet 0   folic acid  (FOLVITE ) 1 MG tablet TAKE  1 TABLET(1 MG) BY MOUTH DAILY 90 tablet 1   hydroxyurea  (HYDREA ) 500 MG capsule TAKE 1 CAPSULE(500 MG) BY MOUTH TWICE DAILY 120 capsule 2   OVER THE COUNTER MEDICATION OTC medication for macular degeneration-cannot recall name     sucralfate  (CARAFATE ) 1 g tablet TAKE 1 TABLET(1 GRAM) BY MOUTH FOUR TIMES DAILY AT BEDTIME WITH MEALS 360 tablet 1   tretinoin  (RETIN-A ) 0.05 % cream Apply topically at bedtime. 45 g 4   No current facility-administered medications for this visit.    REVIEW OF SYSTEMS:   Constitutional: ( - ) fevers, ( - )  chills , ( - ) night sweats Eyes: ( - ) blurriness of vision, ( - ) double vision, ( - ) watery eyes Ears, nose, mouth, throat, and face: ( - ) mucositis, ( - ) sore throat Respiratory: ( - ) cough, ( - ) dyspnea, ( - ) wheezes Cardiovascular: ( - ) palpitation, ( - ) chest discomfort, ( - ) lower extremity swelling Gastrointestinal:  ( - ) nausea, ( - ) heartburn, ( - ) change in bowel habits Skin: ( - ) abnormal skin rashes Lymphatics: ( - ) new lymphadenopathy, ( - ) easy bruising Neurological: ( - ) numbness, ( - ) tingling, ( - ) new weaknesses Behavioral/Psych: ( - ) mood change, ( - ) new changes  All other systems were reviewed with the patient and are negative.  PHYSICAL EXAMINATION:  Vitals:   09/12/24 1134  BP: 113/67  Pulse: 63  Resp: 13  Temp: 97.9 F (36.6 C)  SpO2: 100%         GENERAL: well appearing elderly Caucasian female,  in NAD  SKIN: skin color, texture, turgor are normal, no rashes or significant lesions EYES: conjunctiva are pink and non-injected, sclera clear LUNGS: clear to auscultation and percussion with normal breathing effort HEART: regular rate & rhythm and no murmurs and no lower extremity edema Musculoskeletal: no cyanosis of digits and no clubbing  PSYCH: alert & oriented x 3, fluent speech NEURO: no focal motor/sensory deficits   LABORATORY DATA:  I have reviewed the data as listed    Latest Ref Rng & Units 09/12/2024   10:52 AM 08/08/2024   12:40 PM 07/21/2024    2:40 PM  CBC  WBC 4.0 - 10.5 K/uL 2.3  2.5  3.3   Hemoglobin 12.0 - 15.0 g/dL 88.8  89.3  88.2   Hematocrit 36.0 - 46.0 % 32.8  30.8  34.3   Platelets 150 - 400 K/uL 558  489  219        Latest Ref Rng & Units 09/12/2024   10:52 AM 08/08/2024   12:40 PM 07/21/2024    2:40 PM  CMP  Glucose 70 - 99 mg/dL 895  887  893   BUN 8 - 23 mg/dL 11  12  11    Creatinine 0.44 - 1.00 mg/dL 9.20  9.19  9.27   Sodium 135 - 145 mmol/L 140  139  138   Potassium 3.5 - 5.1 mmol/L 4.2  4.3  4.1   Chloride 98 - 111 mmol/L 104  104  103   CO2 22 - 32 mmol/L 28  26  27    Calcium  8.9 - 10.3 mg/dL 89.9  9.7  9.9   Total Protein 6.5 - 8.1 g/dL 7.2  6.6  7.0   Total Bilirubin 0.0 - 1.2 mg/dL 0.4  0.3  0.5   Alkaline Phos 38 - 126 U/L 62  58  65   AST 15 - 41 U/L 25  28  28    ALT 0 - 44 U/L 17  16  17     RADIOGRAPHIC STUDIES: No results found.   ASSESSMENT & PLAN Kristina Osborne 74 y.o. female with medical history significant for essential thrombocytosis who presents for a follow up visit.   After review of the labs, review of the records, and discussion with the patient the patients findings are most consistent with thrombocytosis, reported due to essential thrombocythemia. We have received the records showing that she has a JAK2 mutation.  Additionally she will require a bone marrow biopsy in order to assure that essential  thrombocythemia is the diagnosis.  In the interim we will have the patient continue her hydroxyurea  as previously prescribed by her provider in [place] .  # Essential thrombocythemia, JAK2 Positive  -- At this time we will need to confirm that the patient has essential thrombocythemia.  She will require a bone marrow biopsy to confirm this diagnosis --JAK2 testing received from patient's prior hematologist. Confirmed JAK2 mutation. Plan:   -- If and when the patient is agreeable we will schedule her for a bone marrow biopsy. --Hydroxyurea  was previously held due to GI bleed and anemia.  Anemia is improving with IV iron  therapy and therefore we can cotinue hydroxyurea  at this time. --previously on 1500 mg hydroxyurea  daily (1000 in PM and 500 in AM) .  Will increase to 1000 mg twice daily as her platelets are currently above target.  558 --I would be very hesitant to transition her to anagrelide in the future given her proclivity for bleeding. -- Labs today show WBC 2.3, hemoglobin 1.1, MCV 131.2, platelets 558 --continue with labs q 2 weeks followed by a clinic visit in 8 weeks.  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  More recent doses were performed on 11/07/2022, 01/18/2023, and most recently 06/26/2023. Feraheme administered on 2/7-2/14/2025.  --Patient has history of GI bleeding. Patient notes no active maroon/red/black stools.  Appreciate assistance of GI in the management of this case. --Labs today as noted above.  -- If the patient were to have recurrent anemia or persistent iron  deficiency will order another dose of IV iron  --continue to follow with above labs.   No orders of the defined types were placed in this encounter.   All questions were answered. The patient knows to call the clinic with any problems, questions or concerns.  A total of  more than 30 minutes were spent on this encounter with face-to-face time and non-face-to-face time, including preparing to see the patient, ordering tests and/or medications, counseling the patient and coordination of care as outlined above.   Norleen IVAR Kidney, MD Department of Hematology/Oncology [facility] Phone: [phone] Pager: [phone] Email: [email or web address]  09/14/2024 6:07 PM "

## 2024-09-12 NOTE — Telephone Encounter (Signed)
 Returned call and spoke with  pt who had requested B 12 test added to her labs today. Dr Federico placed order.  Pt notified and voiced understanding.

## 2024-09-14 LAB — METHYLMALONIC ACID, SERUM: Methylmalonic Acid, Quantitative: 161 nmol/L (ref 0–378)

## 2024-09-14 NOTE — Therapy (Incomplete)
 " OUTPATIENT PHYSICAL THERAPY LOWER EXTREMITY EVALUATION   Patient Name: Kristina Osborne MRN: [id number] DOB:Jun 15, 1951, 74 y.o., female Today's Date: 09/14/2024  END OF SESSION:   Past Medical History:  Diagnosis Date   ABLA (acute blood loss anemia) 04/16/2022   Congenital malformation of intestinal fixation (HCC)    Gastritis    Gastroesophageal reflux disease without esophagitis 09/10/2021   IDA (iron  deficiency anemia)    Mitral valve prolapse    Persistent hyperplasia of thymus    S/P Thymectomy   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 Wendel   Thrombocytosis    Past Surgical History:  Procedure Laterality Date   BIOPSY  06/26/2022   Procedure: BIOPSY;  Surgeon: Stacia Glendia BRAVO, MD;  Location: [facility];  Service: Gastroenterology;;   COLONOSCOPY N/A 04/17/2022   Procedure: COLONOSCOPY;  Surgeon: Albertus Gordy HERO, MD;  Location: [facility];  Service: Gastroenterology;  Laterality: N/A;   COLONOSCOPY N/A 04/24/2024   Procedure: COLONOSCOPY;  Surgeon: Legrand Victory LITTIE DOUGLAS, MD;  Location: [facility];  Service: Gastroenterology;  Laterality: N/A;   COLONOSCOPY WITH PROPOFOL  N/A 06/26/2022   Procedure: COLONOSCOPY WITH PROPOFOL ;  Surgeon: Stacia Glendia BRAVO, MD;  Location: [facility];  Service: Gastroenterology;  Laterality: N/A;   ENTEROSCOPY N/A 04/17/2022   Procedure: ENTEROSCOPY;  Surgeon: Albertus Gordy HERO, MD;  Location: [facility];  Service: Gastroenterology;  Laterality: N/A;   ENTEROSCOPY N/A 06/26/2022   Procedure: ENTEROSCOPY;  Surgeon: Stacia Glendia BRAVO, MD;  Location: [facility];  Service: Gastroenterology;  Laterality: N/A;   ENTEROSCOPY N/A 04/23/2024   Procedure: ENTEROSCOPY;  Surgeon: Legrand Victory LITTIE DOUGLAS, MD;  Location: [facility];  Service: Gastroenterology;  Laterality: N/A;   GIVENS CAPSULE STUDY N/A 06/26/2022   Procedure: GIVENS CAPSULE STUDY;  Surgeon:  Stacia Glendia BRAVO, MD;  Location: [facility];  Service: Gastroenterology;  Laterality: N/A;   GIVENS CAPSULE STUDY N/A 04/24/2024   Procedure: IMAGING PROCEDURE, GI TRACT, INTRALUMINAL, VIA CAPSULE;  Surgeon: Legrand Victory LITTIE DOUGLAS, MD;  Location: [facility];  Service: Gastroenterology;  Laterality: N/A;   HEMOSTASIS CLIP PLACEMENT  04/17/2022   Procedure: HEMOSTASIS CLIP PLACEMENT;  Surgeon: Albertus Gordy HERO, MD;  Location: [facility];  Service: Gastroenterology;;   HEMOSTASIS CLIP PLACEMENT  06/26/2022   Procedure: HEMOSTASIS CLIP PLACEMENT;  Surgeon: Stacia Glendia BRAVO, MD;  Location: [facility];  Service: Gastroenterology;;   HOT HEMOSTASIS N/A 04/17/2022   Procedure: HOT HEMOSTASIS (ARGON PLASMA COAGULATION/BICAP);  Surgeon: Albertus Gordy HERO, MD;  Location: [facility];  Service: Gastroenterology;  Laterality: N/A;   HOT HEMOSTASIS N/A 06/26/2022   Procedure: HOT HEMOSTASIS (ARGON PLASMA COAGULATION/BICAP);  Surgeon: Stacia Glendia BRAVO, MD;  Location: [facility];  Service: Gastroenterology;  Laterality: N/A;   POLYPECTOMY  04/17/2022   Procedure: POLYPECTOMY;  Surgeon: Albertus Gordy HERO, MD;  Location: [facility];  Service: Gastroenterology;;   POLYPECTOMY  06/26/2022   Procedure: POLYPECTOMY;  Surgeon: Stacia Glendia BRAVO, MD;  Location: [facility];  Service: Gastroenterology;;   RIGHT/LEFT HEART CATH AND CORONARY ANGIOGRAPHY N/A 11/01/2023   Procedure: RIGHT/LEFT HEART CATH AND CORONARY ANGIOGRAPHY;  Surgeon: Darron Deatrice LABOR, MD;  Location: [facility];  Service: Cardiovascular;  Laterality: N/A;   SMALL INTESTINE SURGERY  1994   SUBMUCOSAL TATTOO INJECTION  06/26/2022   Procedure: SUBMUCOSAL TATTOO INJECTION;  Surgeon: Stacia Glendia BRAVO, MD;  Location: [facility];  Service: Gastroenterology;;   THYMECTOMY N/A  TRANSCATHETER MITRAL EDGE TO EDGE REPAIR N/A 11/28/2023   Procedure: TRANSCATHETER MITRAL EDGE TO EDGE REPAIR;  Surgeon: Thukkani, Arun K, MD;  Location: [facility];  Service: Cardiovascular;  Laterality: N/A;   TRANSESOPHAGEAL ECHOCARDIOGRAM (CATH LAB) N/A 10/24/2023   Procedure: TRANSESOPHAGEAL ECHOCARDIOGRAM;  Surgeon: Mona Vinie BROCKS, MD;  Location: [facility];  Service: Cardiovascular;  Laterality: N/A;   TRANSESOPHAGEAL ECHOCARDIOGRAM (CATH LAB) N/A 11/28/2023   Procedure: TRANSESOPHAGEAL ECHOCARDIOGRAM;  Surgeon: Wendel Lurena POUR, MD;  Location: [facility];  Service: Cardiovascular;  Laterality: N/A;   Patient Active Problem List   Diagnosis Date Noted   AVM (arteriovenous malformation) of small bowel, acquired with hemorrhage 04/25/2024   Melena 04/23/2024   Lower GI bleed 04/22/2024   S/P mitral valve clip implantation 11/28/2023   Pulmonary hypertension, unspecified (HCC) 11/02/2023   (HFpEF) heart failure with preserved ejection fraction (HCC) 11/02/2023   Hypokalemia 10/18/2023   Severe mitral regurgitation 10/18/2023   Posterior neck pain 09/08/2023   LLQ pain 09/08/2023   Acute non-recurrent maxillary sinusitis 08/09/2023   Sinus congestion 08/09/2023   Sinus headache 08/09/2023   Macrocytic anemia 06/25/2022   GI bleed 06/24/2022   Iron  deficiency anemia    Heme positive stool    Jejunal polyp    Benign neoplasm of colon    Acute blood loss anemia 04/16/2022   Symptomatic anemia 04/16/2022   GI bleeding 04/15/2022   Right elbow pain 11/06/2021   Palpitations 11/06/2021   Cervical radiculitis 11/02/2021   AVM (arteriovenous malformation) of colon 09/10/2021   MVP (mitral valve prolapse) 09/10/2021   Gastroesophageal reflux disease without esophagitis 09/10/2021   Thrombocytosis 11/12/2019    PCP: Norleen Lynwood ORN, MD  REFERRING PROVIDER: Leonce Katz, DO  REFERRING DIAG: F82.88 (ICD-10-CM) - Primary osteoarthritis of right knee M25.561,G89.29 (ICD-10-CM) - Chronic pain of right knee  THERAPY DIAG:  No diagnosis found.  Rationale for Evaluation and Treatment: Rehabilitation  ONSET  DATE: 08/12/2024 (date of referral)  SUBJECTIVE:   SUBJECTIVE STATEMENT: ***  PERTINENT HISTORY: ***Pt with primary OA and chronic pain of R knee.  S/p R knee joint injection for flare of OA 08/12/24.  H/o medial meniscus tear.  ***Reports of some numbness down to her toes with cramping going up to her groin; also decreased ROM and reports of a burning sensation. (+) Thessaly test.  PMH includes h/o GI bleed, iron  deficiency anemia, HFpEF, MVP, thrombocytosis. PAIN:  Are you having pain? {OPRCPAIN:27236}  PRECAUTIONS: {Therapy precautions:24002}  RED FLAGS: {PT Red Flags:29287}   WEIGHT BEARING RESTRICTIONS: {Yes ***/No:24003}  FALLS:  Has patient fallen in last 6 months? {fallsyesno:27318}  LIVING ENVIRONMENT: Lives with: {OPRC lives with:25569::lives with their family} Lives in: {Lives in:25570} Stairs: {opstairs:27293} Has following equipment at home: {Assistive devices:23999}  OCCUPATION: ***  PLOF: {PLOF:24004}  PATIENT GOALS: ***  NEXT MD VISIT: ***  OBJECTIVE:  Note: Objective measures were completed at Evaluation unless otherwise noted.  DIAGNOSTIC FINDINGS:  DG R knee 08/04/24: 1. Mild tricompartmental osteoarthritis of the right knee, most pronounced in the medial compartment.  PATIENT SURVEYS:  {rehab surveys:24030}  COGNITION: Overall cognitive status: {cognition:24006}     SENSATION: {sensation:27233}  EDEMA:  {edema:24020}  MUSCLE LENGTH: Hamstrings: Right *** deg; Left *** deg Debby test: Right *** deg; Left *** deg  POSTURE: {posture:25561}  PALPATION: ***  LOWER EXTREMITY ROM:  {AROM/PROM:27142} ROM Right eval Left eval  Hip flexion    Hip extension    Hip abduction  Hip adduction    Hip internal rotation    Hip external rotation    Knee flexion    Knee extension    Ankle dorsiflexion    Ankle plantarflexion    Ankle inversion    Ankle eversion     (Blank rows = not tested)  LOWER EXTREMITY MMT:  MMT  Right eval Left eval  Hip flexion    Hip extension    Hip abduction    Hip adduction    Hip internal rotation    Hip external rotation    Knee flexion    Knee extension    Ankle dorsiflexion    Ankle plantarflexion    Ankle inversion    Ankle eversion     (Blank rows = not tested)  LOWER EXTREMITY SPECIAL TESTS:  Knee special tests: {KNEE SPECIAL TESTS:26240}  FUNCTIONAL TESTS:  {Functional tests:24029}  GAIT: Distance walked: *** Assistive device utilized: {Assistive devices:23999} Level of assistance: {Levels of assistance:24026} Comments: ***                                                                                                                                TREATMENT DATE: ***    PATIENT EDUCATION:  Education details: ***Eval results; POC. Person educated: {Person educated:25204} Education method: {Education Method:25205} Education comprehension: {Education Comprehension:25206}  HOME EXERCISE PROGRAM: ***  ASSESSMENT:  CLINICAL IMPRESSION: Patient is a 74 y.o. female who was seen today for physical therapy evaluation and treatment for R knee primary OA and chronic pain.  Patient presents with ***. These impairments are limiting patient from ***.  Evaluation included the following assessment tools: ***.   Patient will benefit from skilled PT to address noted impairments, improve overall function, and progress towards long term goals.   OBJECTIVE IMPAIRMENTS: {opptimpairments:25111}.   ACTIVITY LIMITATIONS: {activitylimitations:27494}  PARTICIPATION LIMITATIONS: {participationrestrictions:25113}  PERSONAL FACTORS: {Personal factors:25162} are also affecting patient's functional outcome.   REHAB POTENTIAL: {rehabpotential:25112}  CLINICAL DECISION MAKING: {clinical decision making:25114}  EVALUATION COMPLEXITY: {Evaluation complexity:25115}   GOALS: Goals reviewed with patient? {yes/no:20286}  SHORT TERM GOALS: Target date:  *** *** Baseline: Goal status: INITIAL  2.  *** Baseline:  Goal status: INITIAL  3.  *** Baseline:  Goal status: INITIAL  4.  *** Baseline:  Goal status: INITIAL  5.  *** Baseline:  Goal status: INITIAL  6.  *** Baseline:  Goal status: INITIAL  LONG TERM GOALS: Target date: ***  *** Baseline:  Goal status: INITIAL  2.  *** Baseline:  Goal status: INITIAL  3.  *** Baseline:  Goal status: INITIAL  4.  *** Baseline:  Goal status: INITIAL  5.  *** Baseline:  Goal status: INITIAL  6.  *** Baseline:  Goal status: INITIAL   PLAN:  PT FREQUENCY: {rehab frequency:25116}  PT DURATION: {rehab duration:25117}  PLANNED INTERVENTIONS: {rehab planned interventions:25118::97110-Therapeutic exercises,97530- Therapeutic [phone]- Neuromuscular re-education,97535- Self Rjmz,02859- Manual therapy,Patient/Family education}  PLAN FOR NEXT SESSION: ***  Damien Caulk, PT 09/14/2024, 11:02 AM  "

## 2024-09-16 ENCOUNTER — Telehealth: Payer: Self-pay

## 2024-09-16 ENCOUNTER — Ambulatory Visit: Payer: Self-pay

## 2024-09-16 NOTE — Telephone Encounter (Signed)
 FYI Only or Action Required?: FYI only for provider: home care advised.  Patient was last seen in primary care on 08/04/2024 by Ozell Heron HERO, MD.  Called Nurse Triage reporting Nasal Congestion.  Symptoms began today.  Interventions attempted: Nothing.  Symptoms are: stable.  Triage Disposition: Home Care  Patient/caregiver understands and will follow disposition?: Yes                                  2. ONSET: When did the sinus pain start?  (e.g., hours, days)      Today 3. SEVERITY: How bad is the pain?   (Scale 0-10; or none, mild, moderate or severe)     Denies 6. NASAL DISCHARGE: Do you have discharge from your nose? If so ask, What color?     Clear, right nostril 7. FEVER: Do you have a fever? If Yes, ask: What is it, how was it measured, and when did it start?      Denies 8. OTHER SYMPTOMS: Do you have any other symptoms? (e.g., sore throat, cough, earache, difficulty breathing)     Denies: headache, neck stiffness/pain, nausea, vomiting, fluid leaking from ear, dizziness, new onset of ringing in ears, coughing    Patient called in because she started experiencing a clear, runny nose today. Patient became concerned when a friend mentioned it could be a CSF leak. Patient denied red flag symptoms listed above. This RN advised home care at this time.   Reason for Disposition  [1] Sinus congestion as part of a cold AND [2] present < 10 days  Protocols used: Sinus Pain or Congestion-A-AH  Copied from CRM #[id number]. Topic: Clinical - Medical Advice >> Sep 16, 2024  4:21 PM Avram MATSU wrote: Reason for CRM: patient went to rehab with her husband and started to have nasal drainage on the right side no other symptoms and would like to know if she needed to come in to be seen. She has concerns about cerebral spinal fluid  Please advise [phone] ----------------------------------------------------------------------- From  previous Reason for Contact - Scheduling: Patient/patient representative is calling to schedule an appointment. Refer to attachments for appointment information.

## 2024-09-16 NOTE — Telephone Encounter (Signed)
 Returned call to pt who had left message inquiring about lab results. Advised that we would call her with results after Dr Federico reviews them.  Pt voiced understanding.

## 2024-09-17 ENCOUNTER — Ambulatory Visit: Admitting: Physical Therapy

## 2024-09-17 ENCOUNTER — Telehealth: Payer: Self-pay | Admitting: *Deleted

## 2024-09-17 ENCOUNTER — Other Ambulatory Visit: Payer: Self-pay | Admitting: *Deleted

## 2024-09-17 MED ORDER — HYDROXYUREA 500 MG PO CAPS: 500.0000 mg | ORAL_CAPSULE | Freq: Two times a day (BID) | ORAL | 2 refills | Status: AC

## 2024-09-17 NOTE — Telephone Encounter (Signed)
 TCT patient regarding recent lab results. Spoke with her. Advised, per Dr. Federico, to continue her Hydroxyurea  500 mg BID at this time. She is to continue her current B12 tablets daily.Pt voiced understanding.  Pt voiced understandiing

## 2024-09-19 NOTE — Progress Notes (Unsigned)
"             ° °   Ben Jackson D[facility] [address] [place] [address] Phone: [phone]   Assessment and Plan:     ***    Pertinent previous records reviewed include ***   Follow Up: ***     Subjective:   I, Catrina Fellenz, am serving as a neurosurgeon for Doctor Morene Mace   Chief Complaint: right knee pain    HPI:    08/12/2024 Patient is a 74 year old female with right knee pain. Patient states pain started 2 weeks ago.  No MOI. Hx of medial meniscus tear. Prednisone  helped for a couple of days. No radiating pain. Tylneol doesn't help with the pain. Tramadol  was also given. Notes she has had some numbness down to her toes with cramping going up to her groin. Decreased ROM. She does endorse a burning sensation   09/23/2024 Patient states   Relevant Historical Information: History of GI bleed, HFpEF, thrombocytosis  Additional pertinent review of systems negative.  Current Medications[1]   Objective:     There were no vitals filed for this visit.    There is no height or weight on file to calculate BMI.    Physical Exam:    ***   Electronically signed by:  Odis Mace D[facility] 7:20 AM 09/19/24    [1]  Current Outpatient Medications:    acetaminophen  (TYLENOL ) 500 MG tablet, Take 1,000 mg by mouth every 6 (six) hours as needed for headache (pain.)., Disp: , Rfl:    Cyanocobalamin  1000 MCG CAPS, Take 1,000 mcg by mouth daily., Disp: 90 capsule, Rfl: 3   famotidine  (PEPCID ) 40 MG tablet, TAKE 1 TABLET(40 MG) BY MOUTH DAILY, Disp: 90 tablet, Rfl: 0   folic acid  (FOLVITE ) 1 MG tablet, TAKE 1 TABLET(1 MG) BY MOUTH DAILY, Disp: 90 tablet, Rfl: 1   hydroxyurea  (HYDREA ) 500 MG capsule, Take 1 capsule (500 mg total) by mouth 2 (two) times daily. May take with food to minimize GI side effects., Disp: 120 capsule, Rfl: 2   OVER THE COUNTER MEDICATION, OTC medication for macular degeneration-cannot recall  name, Disp: , Rfl:    sucralfate  (CARAFATE ) 1 g tablet, TAKE 1 TABLET(1 GRAM) BY MOUTH FOUR TIMES DAILY AT BEDTIME WITH MEALS, Disp: 360 tablet, Rfl: 1   tretinoin  (RETIN-A ) 0.05 % cream, Apply topically at bedtime., Disp: 45 g, Rfl: 4  "

## 2024-09-23 ENCOUNTER — Ambulatory Visit: Admitting: Sports Medicine

## 2024-09-23 ENCOUNTER — Other Ambulatory Visit: Payer: Self-pay | Admitting: Hematology and Oncology

## 2024-09-23 ENCOUNTER — Encounter (HOSPITAL_COMMUNITY): Payer: Self-pay | Admitting: Gastroenterology

## 2024-09-23 DIAGNOSIS — D473 Essential (hemorrhagic) thrombocythemia: Secondary | ICD-10-CM

## 2024-09-23 NOTE — Therapy (Incomplete)
 " OUTPATIENT PHYSICAL THERAPY LOWER EXTREMITY EVALUATION   Patient Name: Kristina Osborne MRN: [id number] DOB:1950-12-02, 74 y.o., female Today's Date: 09/23/2024  END OF SESSION:   Past Medical History:  Diagnosis Date   ABLA (acute blood loss anemia) 04/16/2022   Congenital malformation of intestinal fixation (HCC)    Gastritis    Gastroesophageal reflux disease without esophagitis 09/10/2021   IDA (iron  deficiency anemia)    Mitral valve prolapse    Persistent hyperplasia of thymus    S/P Thymectomy   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 Wendel   Thrombocytosis    Past Surgical History:  Procedure Laterality Date   BIOPSY  06/26/2022   Procedure: BIOPSY;  Surgeon: Stacia Glendia BRAVO, MD;  Location: [facility];  Service: Gastroenterology;;   COLONOSCOPY N/A 04/17/2022   Procedure: COLONOSCOPY;  Surgeon: Albertus Gordy HERO, MD;  Location: [facility];  Service: Gastroenterology;  Laterality: N/A;   COLONOSCOPY N/A 04/24/2024   Procedure: COLONOSCOPY;  Surgeon: Legrand Victory LITTIE DOUGLAS, MD;  Location: [facility];  Service: Gastroenterology;  Laterality: N/A;   COLONOSCOPY WITH PROPOFOL  N/A 06/26/2022   Procedure: COLONOSCOPY WITH PROPOFOL ;  Surgeon: Stacia Glendia BRAVO, MD;  Location: [facility];  Service: Gastroenterology;  Laterality: N/A;   ENTEROSCOPY N/A 04/17/2022   Procedure: ENTEROSCOPY;  Surgeon: Albertus Gordy HERO, MD;  Location: [facility];  Service: Gastroenterology;  Laterality: N/A;   ENTEROSCOPY N/A 06/26/2022   Procedure: ENTEROSCOPY;  Surgeon: Stacia Glendia BRAVO, MD;  Location: [facility];  Service: Gastroenterology;  Laterality: N/A;   ENTEROSCOPY N/A 04/23/2024   Procedure: ENTEROSCOPY;  Surgeon: Legrand Victory LITTIE DOUGLAS, MD;  Location: [facility];  Service: Gastroenterology;  Laterality: N/A;   GIVENS CAPSULE STUDY N/A 06/26/2022   Procedure: GIVENS CAPSULE STUDY;  Surgeon:  Stacia Glendia BRAVO, MD;  Location: [facility];  Service: Gastroenterology;  Laterality: N/A;   GIVENS CAPSULE STUDY N/A 04/24/2024   Procedure: IMAGING PROCEDURE, GI TRACT, INTRALUMINAL, VIA CAPSULE;  Surgeon: Legrand Victory LITTIE DOUGLAS, MD;  Location: [facility];  Service: Gastroenterology;  Laterality: N/A;   HEMOSTASIS CLIP PLACEMENT  04/17/2022   Procedure: HEMOSTASIS CLIP PLACEMENT;  Surgeon: Albertus Gordy HERO, MD;  Location: [facility];  Service: Gastroenterology;;   HEMOSTASIS CLIP PLACEMENT  06/26/2022   Procedure: HEMOSTASIS CLIP PLACEMENT;  Surgeon: Stacia Glendia BRAVO, MD;  Location: [facility];  Service: Gastroenterology;;   HOT HEMOSTASIS N/A 04/17/2022   Procedure: HOT HEMOSTASIS (ARGON PLASMA COAGULATION/BICAP);  Surgeon: Albertus Gordy HERO, MD;  Location: [facility];  Service: Gastroenterology;  Laterality: N/A;   HOT HEMOSTASIS N/A 06/26/2022   Procedure: HOT HEMOSTASIS (ARGON PLASMA COAGULATION/BICAP);  Surgeon: Stacia Glendia BRAVO, MD;  Location: [facility];  Service: Gastroenterology;  Laterality: N/A;   POLYPECTOMY  04/17/2022   Procedure: POLYPECTOMY;  Surgeon: Albertus Gordy HERO, MD;  Location: [facility];  Service: Gastroenterology;;   POLYPECTOMY  06/26/2022   Procedure: POLYPECTOMY;  Surgeon: Stacia Glendia BRAVO, MD;  Location: [facility];  Service: Gastroenterology;;   RIGHT/LEFT HEART CATH AND CORONARY ANGIOGRAPHY N/A 11/01/2023   Procedure: RIGHT/LEFT HEART CATH AND CORONARY ANGIOGRAPHY;  Surgeon: Darron Deatrice LABOR, MD;  Location: [facility];  Service: Cardiovascular;  Laterality: N/A;   SMALL INTESTINE SURGERY  1994   SUBMUCOSAL TATTOO INJECTION  06/26/2022   Procedure: SUBMUCOSAL TATTOO INJECTION;  Surgeon: Stacia Glendia BRAVO, MD;  Location: [facility];  Service: Gastroenterology;;   THYMECTOMY N/A  TRANSCATHETER MITRAL EDGE TO EDGE REPAIR N/A 11/28/2023   Procedure: TRANSCATHETER MITRAL EDGE TO EDGE REPAIR;  Surgeon: Thukkani, Arun K, MD;  Location: [facility];  Service: Cardiovascular;  Laterality: N/A;   TRANSESOPHAGEAL ECHOCARDIOGRAM (CATH LAB) N/A 10/24/2023   Procedure: TRANSESOPHAGEAL ECHOCARDIOGRAM;  Surgeon: Mona Vinie BROCKS, MD;  Location: [facility];  Service: Cardiovascular;  Laterality: N/A;   TRANSESOPHAGEAL ECHOCARDIOGRAM (CATH LAB) N/A 11/28/2023   Procedure: TRANSESOPHAGEAL ECHOCARDIOGRAM;  Surgeon: Wendel Lurena POUR, MD;  Location: [facility];  Service: Cardiovascular;  Laterality: N/A;   Patient Active Problem List   Diagnosis Date Noted   AVM (arteriovenous malformation) of small bowel, acquired with hemorrhage 04/25/2024   Melena 04/23/2024   Lower GI bleed 04/22/2024   S/P mitral valve clip implantation 11/28/2023   Pulmonary hypertension, unspecified (HCC) 11/02/2023   (HFpEF) heart failure with preserved ejection fraction (HCC) 11/02/2023   Hypokalemia 10/18/2023   Severe mitral regurgitation 10/18/2023   Posterior neck pain 09/08/2023   LLQ pain 09/08/2023   Acute non-recurrent maxillary sinusitis 08/09/2023   Sinus congestion 08/09/2023   Sinus headache 08/09/2023   Macrocytic anemia 06/25/2022   GI bleed 06/24/2022   Iron  deficiency anemia    Heme positive stool    Jejunal polyp    Benign neoplasm of colon    Acute blood loss anemia 04/16/2022   Symptomatic anemia 04/16/2022   GI bleeding 04/15/2022   Right elbow pain 11/06/2021   Palpitations 11/06/2021   Cervical radiculitis 11/02/2021   AVM (arteriovenous malformation) of colon 09/10/2021   MVP (mitral valve prolapse) 09/10/2021   Gastroesophageal reflux disease without esophagitis 09/10/2021   Thrombocytosis 11/12/2019    PCP: Norleen Lynwood ORN, MD  REFERRING PROVIDER: Leonce Katz, DO  REFERRING DIAG: F82.88 (ICD-10-CM) - Primary osteoarthritis of right knee M25.561,G89.29 (ICD-10-CM) - Chronic pain of right knee  THERAPY DIAG:  No diagnosis found.  Rationale for Evaluation and Treatment: Rehabilitation  ONSET  DATE: 08/12/2024 (date of referral)  SUBJECTIVE:   SUBJECTIVE STATEMENT: ***  PERTINENT HISTORY: ***Pt with primary OA and chronic pain of R knee.  S/p R knee joint injection for flare of OA 08/12/24.  H/o medial meniscus tear.  ***Reports of some numbness down to her toes with cramping going up to her groin; also decreased ROM and reports of a burning sensation. (+) Thessaly test.  PMH includes h/o GI bleed, iron  deficiency anemia, HFpEF, MVP, thrombocytosis. PAIN:  Are you having pain? {OPRCPAIN:27236}  PRECAUTIONS: {Therapy precautions:24002}  RED FLAGS: {PT Red Flags:29287}   WEIGHT BEARING RESTRICTIONS: {Yes ***/No:24003}  FALLS:  Has patient fallen in last 6 months? {fallsyesno:27318}  LIVING ENVIRONMENT: Lives with: {OPRC lives with:25569::lives with their family} Lives in: {Lives in:25570} Stairs: {opstairs:27293} Has following equipment at home: {Assistive devices:23999}  OCCUPATION: ***  PLOF: {PLOF:24004}  PATIENT GOALS: ***  NEXT MD VISIT: ***  OBJECTIVE:  Note: Objective measures were completed at Evaluation unless otherwise noted.  DIAGNOSTIC FINDINGS:  DG R knee 08/04/24: 1. Mild tricompartmental osteoarthritis of the right knee, most pronounced in the medial compartment.  PATIENT SURVEYS:  {rehab surveys:24030}  COGNITION: Overall cognitive status: {cognition:24006}     SENSATION: {sensation:27233}  EDEMA:  {edema:24020}  MUSCLE LENGTH: Hamstrings: Right *** deg; Left *** deg Debby test: Right *** deg; Left *** deg  POSTURE: {posture:25561}  PALPATION: ***  LOWER EXTREMITY ROM:  {AROM/PROM:27142} ROM Right eval Left eval  Hip flexion    Hip extension    Hip abduction  Hip adduction    Hip internal rotation    Hip external rotation    Knee flexion    Knee extension    Ankle dorsiflexion    Ankle plantarflexion    Ankle inversion    Ankle eversion     (Blank rows = not tested)  LOWER EXTREMITY MMT:  MMT  Right eval Left eval  Hip flexion    Hip extension    Hip abduction    Hip adduction    Hip internal rotation    Hip external rotation    Knee flexion    Knee extension    Ankle dorsiflexion    Ankle plantarflexion    Ankle inversion    Ankle eversion     (Blank rows = not tested)  LOWER EXTREMITY SPECIAL TESTS:  Knee special tests: {KNEE SPECIAL TESTS:26240}  FUNCTIONAL TESTS:  {Functional tests:24029}  GAIT: Distance walked: *** Assistive device utilized: {Assistive devices:23999} Level of assistance: {Levels of assistance:24026} Comments: ***                                                                                                                                TREATMENT DATE: ***    PATIENT EDUCATION:  Education details: ***Eval results; POC. Person educated: {Person educated:25204} Education method: {Education Method:25205} Education comprehension: {Education Comprehension:25206}  HOME EXERCISE PROGRAM: ***  ASSESSMENT:  CLINICAL IMPRESSION: Patient is a 74 y.o. female who was seen today for physical therapy evaluation and treatment for R knee primary OA and chronic R knee pain.  Patient presents with ***. These impairments are limiting patient from ***.  Evaluation included the following assessment tools: ***.   Patient will benefit from skilled PT to address noted impairments, improve overall function, and progress towards long term goals.   OBJECTIVE IMPAIRMENTS: {opptimpairments:25111}.   ACTIVITY LIMITATIONS: {activitylimitations:27494}  PARTICIPATION LIMITATIONS: {participationrestrictions:25113}  PERSONAL FACTORS: {Personal factors:25162} are also affecting patient's functional outcome.   REHAB POTENTIAL: {rehabpotential:25112}  CLINICAL DECISION MAKING: {clinical decision making:25114}  EVALUATION COMPLEXITY: {Evaluation complexity:25115}   GOALS: Goals reviewed with patient? {yes/no:20286}  SHORT TERM GOALS: Target date:  *** *** Baseline: Goal status: INITIAL  2.  *** Baseline:  Goal status: INITIAL  3.  *** Baseline:  Goal status: INITIAL  4.  *** Baseline:  Goal status: INITIAL  5.  *** Baseline:  Goal status: INITIAL  6.  *** Baseline:  Goal status: INITIAL  LONG TERM GOALS: Target date: ***  *** Baseline:  Goal status: INITIAL  2.  *** Baseline:  Goal status: INITIAL  3.  *** Baseline:  Goal status: INITIAL  4.  *** Baseline:  Goal status: INITIAL  5.  *** Baseline:  Goal status: INITIAL  6.  *** Baseline:  Goal status: INITIAL   PLAN:  PT FREQUENCY: {rehab frequency:25116}  PT DURATION: {rehab duration:25117}  PLANNED INTERVENTIONS: {rehab planned interventions:25118::97110-Therapeutic exercises,97530- Therapeutic [phone]- Neuromuscular re-education,97535- Self Rjmz,02859- Manual therapy,Patient/Family education}  PLAN FOR NEXT SESSION: ***  Damien Caulk, PT 09/23/2024, 9:22 AM  "

## 2024-09-24 ENCOUNTER — Telehealth: Payer: Self-pay | Admitting: Hematology and Oncology

## 2024-09-24 ENCOUNTER — Inpatient Hospital Stay

## 2024-09-24 NOTE — Telephone Encounter (Signed)
 Pt called to move today's appt to next week

## 2024-09-30 ENCOUNTER — Telehealth: Payer: Self-pay | Admitting: Gastroenterology

## 2024-09-30 NOTE — Telephone Encounter (Addendum)
 Procedure:Colonoscopy/Enteroscopy Procedure date: 10/06/24 Procedure location: [facility] Arrival Time: 6:00 am Spoke with the patient Y/N: Yes Any prep concerns? No Has the patient obtained the prep from the pharmacy ? NA Do you have a care partner and transportation: NA Any additional concerns? Pt stated her husband is I the hospital and having to be transferred to hospice. She doesn't want to be away from him and is wanting to cancel her procedure.

## 2024-10-01 ENCOUNTER — Telehealth: Payer: Self-pay | Admitting: Physician Assistant

## 2024-10-01 NOTE — Telephone Encounter (Signed)
 I spoke with patient and she is rescheduled for lab and NP appointments from 10/22/2024 to 10/24/2024. Patient aware of date/time change.

## 2024-10-02 ENCOUNTER — Inpatient Hospital Stay

## 2024-10-02 ENCOUNTER — Telehealth: Payer: Self-pay | Admitting: *Deleted

## 2024-10-02 NOTE — Telephone Encounter (Signed)
 Received vm message from pt stating that she is unable to come for her lab appt today. Her husband is in in patient hospice and and she is not sure when she can come here as his condition is deteriorating hourly. Attempted call back. No answer. Left vm message for her to call back when she could. Today's appt cancelled.

## 2024-10-03 ENCOUNTER — Telehealth: Payer: Self-pay | Admitting: Hematology and Oncology

## 2024-10-03 ENCOUNTER — Telehealth: Payer: Self-pay | Admitting: Gastroenterology

## 2024-10-03 NOTE — Telephone Encounter (Signed)
 Incoming call from pt requesting to reschedule hospital procedure. Pt scheduled for (02/09) Please advise. Thank you.

## 2024-10-03 NOTE — Telephone Encounter (Signed)
Pt called to reschedule lab appt

## 2024-10-06 ENCOUNTER — Inpatient Hospital Stay: Attending: Hematology and Oncology

## 2024-10-06 ENCOUNTER — Encounter (HOSPITAL_COMMUNITY): Admission: RE | Payer: Self-pay | Source: Home / Self Care

## 2024-10-06 ENCOUNTER — Ambulatory Visit (HOSPITAL_COMMUNITY): Admission: RE | Admit: 2024-10-06 | Source: Home / Self Care | Admitting: Gastroenterology

## 2024-10-07 ENCOUNTER — Ambulatory Visit: Admitting: Physical Therapy

## 2024-10-22 ENCOUNTER — Inpatient Hospital Stay: Admitting: Physician Assistant

## 2024-10-22 ENCOUNTER — Inpatient Hospital Stay

## 2024-10-24 ENCOUNTER — Inpatient Hospital Stay: Admitting: Physician Assistant

## 2024-10-24 ENCOUNTER — Inpatient Hospital Stay: Attending: Hematology and Oncology

## 2024-12-02 ENCOUNTER — Ambulatory Visit: Admitting: Dermatology

## 2024-12-22 ENCOUNTER — Ambulatory Visit: Admitting: Physician Assistant

## 2024-12-22 ENCOUNTER — Other Ambulatory Visit (HOSPITAL_COMMUNITY)

## 2025-01-06 ENCOUNTER — Ambulatory Visit: Admitting: Neurology

## 2025-06-01 ENCOUNTER — Ambulatory Visit: Admitting: Dermatology

## 2025-06-05 ENCOUNTER — Ambulatory Visit

## 2025-06-08 ENCOUNTER — Ambulatory Visit: Admitting: Family Medicine
